# Patient Record
Sex: Male | Born: 1975 | Race: White | Hispanic: No | Marital: Single | Smoking: Former smoker
Health system: Southern US, Community
[De-identification: ages and names within clinical notes are randomized; demographics above are authoritative.]

## PROBLEM LIST (undated history)

## (undated) DIAGNOSIS — M869 Osteomyelitis, unspecified: Secondary | ICD-10-CM

## (undated) DIAGNOSIS — F419 Anxiety disorder, unspecified: Secondary | ICD-10-CM

## (undated) DIAGNOSIS — N179 Acute kidney failure, unspecified: Secondary | ICD-10-CM

## (undated) DIAGNOSIS — F329 Major depressive disorder, single episode, unspecified: Secondary | ICD-10-CM

## (undated) DIAGNOSIS — N319 Neuromuscular dysfunction of bladder, unspecified: Secondary | ICD-10-CM

## (undated) DIAGNOSIS — G8220 Paraplegia, unspecified: Secondary | ICD-10-CM

## (undated) DIAGNOSIS — F32A Depression, unspecified: Secondary | ICD-10-CM

## (undated) DIAGNOSIS — L8990 Pressure ulcer of unspecified site, unspecified stage: Secondary | ICD-10-CM

## (undated) HISTORY — PX: IRRIGATION AND DEBRIDEMENT BUTTOCKS: SHX6601

## (undated) HISTORY — PX: TOE AMPUTATION: SHX809

---

## 2000-02-21 DIAGNOSIS — G8220 Paraplegia, unspecified: Secondary | ICD-10-CM

## 2000-02-21 HISTORY — DX: Paraplegia, unspecified: G82.20

## 2007-06-11 ENCOUNTER — Inpatient Hospital Stay: Payer: Self-pay | Admitting: Internal Medicine

## 2007-06-11 ENCOUNTER — Other Ambulatory Visit: Payer: Self-pay

## 2007-08-29 ENCOUNTER — Inpatient Hospital Stay: Payer: Self-pay | Admitting: Internal Medicine

## 2007-08-29 ENCOUNTER — Other Ambulatory Visit: Payer: Self-pay

## 2010-07-03 ENCOUNTER — Emergency Department: Payer: Self-pay | Admitting: Unknown Physician Specialty

## 2010-07-10 ENCOUNTER — Inpatient Hospital Stay: Payer: Self-pay | Admitting: Internal Medicine

## 2010-07-10 ENCOUNTER — Encounter: Payer: Self-pay | Admitting: Cardiothoracic Surgery

## 2010-07-14 ENCOUNTER — Encounter: Payer: Self-pay | Admitting: Cardiothoracic Surgery

## 2010-07-16 LAB — PATHOLOGY REPORT

## 2010-07-29 ENCOUNTER — Encounter: Payer: Self-pay | Admitting: Cardiothoracic Surgery

## 2010-08-28 ENCOUNTER — Encounter: Payer: Self-pay | Admitting: Cardiothoracic Surgery

## 2010-09-28 ENCOUNTER — Encounter: Payer: Self-pay | Admitting: Cardiothoracic Surgery

## 2010-10-28 ENCOUNTER — Encounter: Payer: Self-pay | Admitting: Cardiothoracic Surgery

## 2011-01-18 ENCOUNTER — Ambulatory Visit: Payer: Self-pay | Admitting: Internal Medicine

## 2011-01-20 ENCOUNTER — Ambulatory Visit: Payer: Self-pay

## 2011-03-15 ENCOUNTER — Encounter: Payer: Self-pay | Admitting: Nurse Practitioner

## 2011-03-15 ENCOUNTER — Encounter: Payer: Self-pay | Admitting: Cardiothoracic Surgery

## 2011-03-20 DIAGNOSIS — L8994 Pressure ulcer of unspecified site, stage 4: Secondary | ICD-10-CM | POA: Diagnosis not present

## 2011-03-20 DIAGNOSIS — L89309 Pressure ulcer of unspecified buttock, unspecified stage: Secondary | ICD-10-CM | POA: Diagnosis not present

## 2011-03-20 DIAGNOSIS — G8220 Paraplegia, unspecified: Secondary | ICD-10-CM | POA: Diagnosis not present

## 2011-03-20 DIAGNOSIS — Z4800 Encounter for change or removal of nonsurgical wound dressing: Secondary | ICD-10-CM | POA: Diagnosis not present

## 2011-03-20 DIAGNOSIS — N319 Neuromuscular dysfunction of bladder, unspecified: Secondary | ICD-10-CM | POA: Diagnosis not present

## 2011-03-20 DIAGNOSIS — E46 Unspecified protein-calorie malnutrition: Secondary | ICD-10-CM | POA: Diagnosis not present

## 2011-03-26 DIAGNOSIS — L89309 Pressure ulcer of unspecified buttock, unspecified stage: Secondary | ICD-10-CM | POA: Diagnosis not present

## 2011-03-26 DIAGNOSIS — G8220 Paraplegia, unspecified: Secondary | ICD-10-CM | POA: Diagnosis not present

## 2011-03-26 DIAGNOSIS — E46 Unspecified protein-calorie malnutrition: Secondary | ICD-10-CM | POA: Diagnosis not present

## 2011-03-26 DIAGNOSIS — Z4800 Encounter for change or removal of nonsurgical wound dressing: Secondary | ICD-10-CM | POA: Diagnosis not present

## 2011-03-26 DIAGNOSIS — N319 Neuromuscular dysfunction of bladder, unspecified: Secondary | ICD-10-CM | POA: Diagnosis not present

## 2011-03-26 DIAGNOSIS — L8994 Pressure ulcer of unspecified site, stage 4: Secondary | ICD-10-CM | POA: Diagnosis not present

## 2011-03-27 DIAGNOSIS — G8220 Paraplegia, unspecified: Secondary | ICD-10-CM | POA: Diagnosis not present

## 2011-03-27 DIAGNOSIS — L8994 Pressure ulcer of unspecified site, stage 4: Secondary | ICD-10-CM | POA: Diagnosis not present

## 2011-03-27 DIAGNOSIS — L89309 Pressure ulcer of unspecified buttock, unspecified stage: Secondary | ICD-10-CM | POA: Diagnosis not present

## 2011-03-27 DIAGNOSIS — Z4800 Encounter for change or removal of nonsurgical wound dressing: Secondary | ICD-10-CM | POA: Diagnosis not present

## 2011-03-27 DIAGNOSIS — E46 Unspecified protein-calorie malnutrition: Secondary | ICD-10-CM | POA: Diagnosis not present

## 2011-03-27 DIAGNOSIS — N319 Neuromuscular dysfunction of bladder, unspecified: Secondary | ICD-10-CM | POA: Diagnosis not present

## 2011-03-29 DIAGNOSIS — Z4800 Encounter for change or removal of nonsurgical wound dressing: Secondary | ICD-10-CM | POA: Diagnosis not present

## 2011-03-29 DIAGNOSIS — L8994 Pressure ulcer of unspecified site, stage 4: Secondary | ICD-10-CM | POA: Diagnosis not present

## 2011-03-29 DIAGNOSIS — L89309 Pressure ulcer of unspecified buttock, unspecified stage: Secondary | ICD-10-CM | POA: Diagnosis not present

## 2011-03-29 DIAGNOSIS — E46 Unspecified protein-calorie malnutrition: Secondary | ICD-10-CM | POA: Diagnosis not present

## 2011-03-29 DIAGNOSIS — N319 Neuromuscular dysfunction of bladder, unspecified: Secondary | ICD-10-CM | POA: Diagnosis not present

## 2011-03-29 DIAGNOSIS — G8220 Paraplegia, unspecified: Secondary | ICD-10-CM | POA: Diagnosis not present

## 2011-03-30 ENCOUNTER — Encounter: Payer: Self-pay | Admitting: Cardiothoracic Surgery

## 2011-04-01 DIAGNOSIS — N319 Neuromuscular dysfunction of bladder, unspecified: Secondary | ICD-10-CM | POA: Diagnosis not present

## 2011-04-01 DIAGNOSIS — Z4800 Encounter for change or removal of nonsurgical wound dressing: Secondary | ICD-10-CM | POA: Diagnosis not present

## 2011-04-01 DIAGNOSIS — G8220 Paraplegia, unspecified: Secondary | ICD-10-CM | POA: Diagnosis not present

## 2011-04-01 DIAGNOSIS — L89309 Pressure ulcer of unspecified buttock, unspecified stage: Secondary | ICD-10-CM | POA: Diagnosis not present

## 2011-04-01 DIAGNOSIS — L8994 Pressure ulcer of unspecified site, stage 4: Secondary | ICD-10-CM | POA: Diagnosis not present

## 2011-04-01 DIAGNOSIS — E46 Unspecified protein-calorie malnutrition: Secondary | ICD-10-CM | POA: Diagnosis not present

## 2011-04-04 DIAGNOSIS — L89309 Pressure ulcer of unspecified buttock, unspecified stage: Secondary | ICD-10-CM | POA: Diagnosis not present

## 2011-04-04 DIAGNOSIS — G8220 Paraplegia, unspecified: Secondary | ICD-10-CM | POA: Diagnosis not present

## 2011-04-04 DIAGNOSIS — E46 Unspecified protein-calorie malnutrition: Secondary | ICD-10-CM | POA: Diagnosis not present

## 2011-04-04 DIAGNOSIS — Z4800 Encounter for change or removal of nonsurgical wound dressing: Secondary | ICD-10-CM | POA: Diagnosis not present

## 2011-04-04 DIAGNOSIS — L8994 Pressure ulcer of unspecified site, stage 4: Secondary | ICD-10-CM | POA: Diagnosis not present

## 2011-04-04 DIAGNOSIS — N319 Neuromuscular dysfunction of bladder, unspecified: Secondary | ICD-10-CM | POA: Diagnosis not present

## 2011-04-10 DIAGNOSIS — G8220 Paraplegia, unspecified: Secondary | ICD-10-CM | POA: Diagnosis not present

## 2011-04-10 DIAGNOSIS — E46 Unspecified protein-calorie malnutrition: Secondary | ICD-10-CM | POA: Diagnosis not present

## 2011-04-10 DIAGNOSIS — Z4800 Encounter for change or removal of nonsurgical wound dressing: Secondary | ICD-10-CM | POA: Diagnosis not present

## 2011-04-10 DIAGNOSIS — N319 Neuromuscular dysfunction of bladder, unspecified: Secondary | ICD-10-CM | POA: Diagnosis not present

## 2011-04-10 DIAGNOSIS — L89309 Pressure ulcer of unspecified buttock, unspecified stage: Secondary | ICD-10-CM | POA: Diagnosis not present

## 2011-04-10 DIAGNOSIS — L8994 Pressure ulcer of unspecified site, stage 4: Secondary | ICD-10-CM | POA: Diagnosis not present

## 2011-04-12 DIAGNOSIS — N319 Neuromuscular dysfunction of bladder, unspecified: Secondary | ICD-10-CM | POA: Diagnosis not present

## 2011-04-12 DIAGNOSIS — E46 Unspecified protein-calorie malnutrition: Secondary | ICD-10-CM | POA: Diagnosis not present

## 2011-04-12 DIAGNOSIS — L89309 Pressure ulcer of unspecified buttock, unspecified stage: Secondary | ICD-10-CM | POA: Diagnosis not present

## 2011-04-12 DIAGNOSIS — Z4800 Encounter for change or removal of nonsurgical wound dressing: Secondary | ICD-10-CM | POA: Diagnosis not present

## 2011-04-12 DIAGNOSIS — L8994 Pressure ulcer of unspecified site, stage 4: Secondary | ICD-10-CM | POA: Diagnosis not present

## 2011-04-12 DIAGNOSIS — G8220 Paraplegia, unspecified: Secondary | ICD-10-CM | POA: Diagnosis not present

## 2011-04-15 DIAGNOSIS — L8994 Pressure ulcer of unspecified site, stage 4: Secondary | ICD-10-CM | POA: Diagnosis not present

## 2011-04-15 DIAGNOSIS — E46 Unspecified protein-calorie malnutrition: Secondary | ICD-10-CM | POA: Diagnosis not present

## 2011-04-15 DIAGNOSIS — Z4800 Encounter for change or removal of nonsurgical wound dressing: Secondary | ICD-10-CM | POA: Diagnosis not present

## 2011-04-15 DIAGNOSIS — L89309 Pressure ulcer of unspecified buttock, unspecified stage: Secondary | ICD-10-CM | POA: Diagnosis not present

## 2011-04-15 DIAGNOSIS — N319 Neuromuscular dysfunction of bladder, unspecified: Secondary | ICD-10-CM | POA: Diagnosis not present

## 2011-04-15 DIAGNOSIS — G8220 Paraplegia, unspecified: Secondary | ICD-10-CM | POA: Diagnosis not present

## 2011-04-19 DIAGNOSIS — L8994 Pressure ulcer of unspecified site, stage 4: Secondary | ICD-10-CM | POA: Diagnosis not present

## 2011-04-19 DIAGNOSIS — Z4800 Encounter for change or removal of nonsurgical wound dressing: Secondary | ICD-10-CM | POA: Diagnosis not present

## 2011-04-19 DIAGNOSIS — N319 Neuromuscular dysfunction of bladder, unspecified: Secondary | ICD-10-CM | POA: Diagnosis not present

## 2011-04-19 DIAGNOSIS — L89309 Pressure ulcer of unspecified buttock, unspecified stage: Secondary | ICD-10-CM | POA: Diagnosis not present

## 2011-04-19 DIAGNOSIS — E46 Unspecified protein-calorie malnutrition: Secondary | ICD-10-CM | POA: Diagnosis not present

## 2011-04-19 DIAGNOSIS — G8220 Paraplegia, unspecified: Secondary | ICD-10-CM | POA: Diagnosis not present

## 2011-04-21 DIAGNOSIS — N319 Neuromuscular dysfunction of bladder, unspecified: Secondary | ICD-10-CM | POA: Diagnosis not present

## 2011-04-21 DIAGNOSIS — E46 Unspecified protein-calorie malnutrition: Secondary | ICD-10-CM | POA: Diagnosis not present

## 2011-04-21 DIAGNOSIS — L8994 Pressure ulcer of unspecified site, stage 4: Secondary | ICD-10-CM | POA: Diagnosis not present

## 2011-04-21 DIAGNOSIS — L89309 Pressure ulcer of unspecified buttock, unspecified stage: Secondary | ICD-10-CM | POA: Diagnosis not present

## 2011-04-21 DIAGNOSIS — Z4800 Encounter for change or removal of nonsurgical wound dressing: Secondary | ICD-10-CM | POA: Diagnosis not present

## 2011-04-21 DIAGNOSIS — G8220 Paraplegia, unspecified: Secondary | ICD-10-CM | POA: Diagnosis not present

## 2011-04-26 DIAGNOSIS — L89309 Pressure ulcer of unspecified buttock, unspecified stage: Secondary | ICD-10-CM | POA: Diagnosis not present

## 2011-04-26 DIAGNOSIS — N319 Neuromuscular dysfunction of bladder, unspecified: Secondary | ICD-10-CM | POA: Diagnosis not present

## 2011-04-26 DIAGNOSIS — E46 Unspecified protein-calorie malnutrition: Secondary | ICD-10-CM | POA: Diagnosis not present

## 2011-04-26 DIAGNOSIS — Z4800 Encounter for change or removal of nonsurgical wound dressing: Secondary | ICD-10-CM | POA: Diagnosis not present

## 2011-04-26 DIAGNOSIS — L8994 Pressure ulcer of unspecified site, stage 4: Secondary | ICD-10-CM | POA: Diagnosis not present

## 2011-04-26 DIAGNOSIS — G8220 Paraplegia, unspecified: Secondary | ICD-10-CM | POA: Diagnosis not present

## 2011-04-29 DIAGNOSIS — G8220 Paraplegia, unspecified: Secondary | ICD-10-CM | POA: Diagnosis not present

## 2011-04-29 DIAGNOSIS — L8994 Pressure ulcer of unspecified site, stage 4: Secondary | ICD-10-CM | POA: Diagnosis not present

## 2011-04-29 DIAGNOSIS — L89309 Pressure ulcer of unspecified buttock, unspecified stage: Secondary | ICD-10-CM | POA: Diagnosis not present

## 2011-04-29 DIAGNOSIS — N319 Neuromuscular dysfunction of bladder, unspecified: Secondary | ICD-10-CM | POA: Diagnosis not present

## 2011-04-29 DIAGNOSIS — Z4800 Encounter for change or removal of nonsurgical wound dressing: Secondary | ICD-10-CM | POA: Diagnosis not present

## 2011-04-29 DIAGNOSIS — E46 Unspecified protein-calorie malnutrition: Secondary | ICD-10-CM | POA: Diagnosis not present

## 2011-04-30 ENCOUNTER — Encounter: Payer: Self-pay | Admitting: Cardiothoracic Surgery

## 2011-04-30 DIAGNOSIS — L8994 Pressure ulcer of unspecified site, stage 4: Secondary | ICD-10-CM | POA: Diagnosis not present

## 2011-04-30 DIAGNOSIS — L89209 Pressure ulcer of unspecified hip, unspecified stage: Secondary | ICD-10-CM | POA: Diagnosis not present

## 2011-05-02 DIAGNOSIS — N319 Neuromuscular dysfunction of bladder, unspecified: Secondary | ICD-10-CM | POA: Diagnosis not present

## 2011-05-02 DIAGNOSIS — Z4800 Encounter for change or removal of nonsurgical wound dressing: Secondary | ICD-10-CM | POA: Diagnosis not present

## 2011-05-02 DIAGNOSIS — L8994 Pressure ulcer of unspecified site, stage 4: Secondary | ICD-10-CM | POA: Diagnosis not present

## 2011-05-02 DIAGNOSIS — E46 Unspecified protein-calorie malnutrition: Secondary | ICD-10-CM | POA: Diagnosis not present

## 2011-05-02 DIAGNOSIS — L89309 Pressure ulcer of unspecified buttock, unspecified stage: Secondary | ICD-10-CM | POA: Diagnosis not present

## 2011-05-02 DIAGNOSIS — G8220 Paraplegia, unspecified: Secondary | ICD-10-CM | POA: Diagnosis not present

## 2011-05-08 DIAGNOSIS — Z4800 Encounter for change or removal of nonsurgical wound dressing: Secondary | ICD-10-CM | POA: Diagnosis not present

## 2011-05-08 DIAGNOSIS — E46 Unspecified protein-calorie malnutrition: Secondary | ICD-10-CM | POA: Diagnosis not present

## 2011-05-08 DIAGNOSIS — N319 Neuromuscular dysfunction of bladder, unspecified: Secondary | ICD-10-CM | POA: Diagnosis not present

## 2011-05-08 DIAGNOSIS — L89309 Pressure ulcer of unspecified buttock, unspecified stage: Secondary | ICD-10-CM | POA: Diagnosis not present

## 2011-05-08 DIAGNOSIS — G8220 Paraplegia, unspecified: Secondary | ICD-10-CM | POA: Diagnosis not present

## 2011-05-08 DIAGNOSIS — L8994 Pressure ulcer of unspecified site, stage 4: Secondary | ICD-10-CM | POA: Diagnosis not present

## 2011-05-10 DIAGNOSIS — L8994 Pressure ulcer of unspecified site, stage 4: Secondary | ICD-10-CM | POA: Diagnosis not present

## 2011-05-10 DIAGNOSIS — G8220 Paraplegia, unspecified: Secondary | ICD-10-CM | POA: Diagnosis not present

## 2011-05-10 DIAGNOSIS — N319 Neuromuscular dysfunction of bladder, unspecified: Secondary | ICD-10-CM | POA: Diagnosis not present

## 2011-05-10 DIAGNOSIS — Z4800 Encounter for change or removal of nonsurgical wound dressing: Secondary | ICD-10-CM | POA: Diagnosis not present

## 2011-05-10 DIAGNOSIS — L89309 Pressure ulcer of unspecified buttock, unspecified stage: Secondary | ICD-10-CM | POA: Diagnosis not present

## 2011-05-10 DIAGNOSIS — E46 Unspecified protein-calorie malnutrition: Secondary | ICD-10-CM | POA: Diagnosis not present

## 2011-05-17 DIAGNOSIS — L8994 Pressure ulcer of unspecified site, stage 4: Secondary | ICD-10-CM | POA: Diagnosis not present

## 2011-05-17 DIAGNOSIS — E46 Unspecified protein-calorie malnutrition: Secondary | ICD-10-CM | POA: Diagnosis not present

## 2011-05-17 DIAGNOSIS — G8220 Paraplegia, unspecified: Secondary | ICD-10-CM | POA: Diagnosis not present

## 2011-05-17 DIAGNOSIS — N319 Neuromuscular dysfunction of bladder, unspecified: Secondary | ICD-10-CM | POA: Diagnosis not present

## 2011-05-17 DIAGNOSIS — Z4800 Encounter for change or removal of nonsurgical wound dressing: Secondary | ICD-10-CM | POA: Diagnosis not present

## 2011-05-17 DIAGNOSIS — L89309 Pressure ulcer of unspecified buttock, unspecified stage: Secondary | ICD-10-CM | POA: Diagnosis not present

## 2011-05-19 DIAGNOSIS — F17200 Nicotine dependence, unspecified, uncomplicated: Secondary | ICD-10-CM | POA: Diagnosis not present

## 2011-05-19 DIAGNOSIS — Z4800 Encounter for change or removal of nonsurgical wound dressing: Secondary | ICD-10-CM | POA: Diagnosis not present

## 2011-05-19 DIAGNOSIS — E46 Unspecified protein-calorie malnutrition: Secondary | ICD-10-CM | POA: Diagnosis not present

## 2011-05-19 DIAGNOSIS — L89109 Pressure ulcer of unspecified part of back, unspecified stage: Secondary | ICD-10-CM | POA: Diagnosis not present

## 2011-05-19 DIAGNOSIS — L89309 Pressure ulcer of unspecified buttock, unspecified stage: Secondary | ICD-10-CM | POA: Diagnosis not present

## 2011-05-19 DIAGNOSIS — G8220 Paraplegia, unspecified: Secondary | ICD-10-CM | POA: Diagnosis not present

## 2011-05-19 DIAGNOSIS — L8992 Pressure ulcer of unspecified site, stage 2: Secondary | ICD-10-CM | POA: Diagnosis not present

## 2011-05-19 DIAGNOSIS — L8994 Pressure ulcer of unspecified site, stage 4: Secondary | ICD-10-CM | POA: Diagnosis not present

## 2011-05-19 DIAGNOSIS — N319 Neuromuscular dysfunction of bladder, unspecified: Secondary | ICD-10-CM | POA: Diagnosis not present

## 2011-05-22 DIAGNOSIS — E46 Unspecified protein-calorie malnutrition: Secondary | ICD-10-CM | POA: Diagnosis not present

## 2011-05-22 DIAGNOSIS — L8994 Pressure ulcer of unspecified site, stage 4: Secondary | ICD-10-CM | POA: Diagnosis not present

## 2011-05-22 DIAGNOSIS — L89309 Pressure ulcer of unspecified buttock, unspecified stage: Secondary | ICD-10-CM | POA: Diagnosis not present

## 2011-05-22 DIAGNOSIS — L8992 Pressure ulcer of unspecified site, stage 2: Secondary | ICD-10-CM | POA: Diagnosis not present

## 2011-05-22 DIAGNOSIS — L89109 Pressure ulcer of unspecified part of back, unspecified stage: Secondary | ICD-10-CM | POA: Diagnosis not present

## 2011-05-22 DIAGNOSIS — G8220 Paraplegia, unspecified: Secondary | ICD-10-CM | POA: Diagnosis not present

## 2011-05-23 DIAGNOSIS — L8994 Pressure ulcer of unspecified site, stage 4: Secondary | ICD-10-CM | POA: Diagnosis not present

## 2011-05-23 DIAGNOSIS — L89309 Pressure ulcer of unspecified buttock, unspecified stage: Secondary | ICD-10-CM | POA: Diagnosis not present

## 2011-05-23 DIAGNOSIS — E46 Unspecified protein-calorie malnutrition: Secondary | ICD-10-CM | POA: Diagnosis not present

## 2011-05-23 DIAGNOSIS — L89109 Pressure ulcer of unspecified part of back, unspecified stage: Secondary | ICD-10-CM | POA: Diagnosis not present

## 2011-05-23 DIAGNOSIS — G8220 Paraplegia, unspecified: Secondary | ICD-10-CM | POA: Diagnosis not present

## 2011-05-23 DIAGNOSIS — L8992 Pressure ulcer of unspecified site, stage 2: Secondary | ICD-10-CM | POA: Diagnosis not present

## 2011-05-28 DIAGNOSIS — L8994 Pressure ulcer of unspecified site, stage 4: Secondary | ICD-10-CM | POA: Diagnosis not present

## 2011-05-28 DIAGNOSIS — L89209 Pressure ulcer of unspecified hip, unspecified stage: Secondary | ICD-10-CM | POA: Diagnosis not present

## 2011-05-29 DIAGNOSIS — L89309 Pressure ulcer of unspecified buttock, unspecified stage: Secondary | ICD-10-CM | POA: Diagnosis not present

## 2011-05-29 DIAGNOSIS — E46 Unspecified protein-calorie malnutrition: Secondary | ICD-10-CM | POA: Diagnosis not present

## 2011-05-29 DIAGNOSIS — G8220 Paraplegia, unspecified: Secondary | ICD-10-CM | POA: Diagnosis not present

## 2011-05-29 DIAGNOSIS — L8994 Pressure ulcer of unspecified site, stage 4: Secondary | ICD-10-CM | POA: Diagnosis not present

## 2011-05-29 DIAGNOSIS — L8992 Pressure ulcer of unspecified site, stage 2: Secondary | ICD-10-CM | POA: Diagnosis not present

## 2011-05-29 DIAGNOSIS — L89109 Pressure ulcer of unspecified part of back, unspecified stage: Secondary | ICD-10-CM | POA: Diagnosis not present

## 2011-05-31 ENCOUNTER — Encounter: Payer: Self-pay | Admitting: Cardiothoracic Surgery

## 2011-06-07 DIAGNOSIS — L8992 Pressure ulcer of unspecified site, stage 2: Secondary | ICD-10-CM | POA: Diagnosis not present

## 2011-06-07 DIAGNOSIS — L8994 Pressure ulcer of unspecified site, stage 4: Secondary | ICD-10-CM | POA: Diagnosis not present

## 2011-06-07 DIAGNOSIS — L89109 Pressure ulcer of unspecified part of back, unspecified stage: Secondary | ICD-10-CM | POA: Diagnosis not present

## 2011-06-07 DIAGNOSIS — E46 Unspecified protein-calorie malnutrition: Secondary | ICD-10-CM | POA: Diagnosis not present

## 2011-06-07 DIAGNOSIS — L89309 Pressure ulcer of unspecified buttock, unspecified stage: Secondary | ICD-10-CM | POA: Diagnosis not present

## 2011-06-07 DIAGNOSIS — G8220 Paraplegia, unspecified: Secondary | ICD-10-CM | POA: Diagnosis not present

## 2011-06-12 DIAGNOSIS — E46 Unspecified protein-calorie malnutrition: Secondary | ICD-10-CM | POA: Diagnosis not present

## 2011-06-12 DIAGNOSIS — L8994 Pressure ulcer of unspecified site, stage 4: Secondary | ICD-10-CM | POA: Diagnosis not present

## 2011-06-12 DIAGNOSIS — G8220 Paraplegia, unspecified: Secondary | ICD-10-CM | POA: Diagnosis not present

## 2011-06-12 DIAGNOSIS — L8992 Pressure ulcer of unspecified site, stage 2: Secondary | ICD-10-CM | POA: Diagnosis not present

## 2011-06-12 DIAGNOSIS — L89309 Pressure ulcer of unspecified buttock, unspecified stage: Secondary | ICD-10-CM | POA: Diagnosis not present

## 2011-06-12 DIAGNOSIS — L89109 Pressure ulcer of unspecified part of back, unspecified stage: Secondary | ICD-10-CM | POA: Diagnosis not present

## 2011-06-20 DIAGNOSIS — L89109 Pressure ulcer of unspecified part of back, unspecified stage: Secondary | ICD-10-CM | POA: Diagnosis not present

## 2011-06-20 DIAGNOSIS — L8994 Pressure ulcer of unspecified site, stage 4: Secondary | ICD-10-CM | POA: Diagnosis not present

## 2011-06-20 DIAGNOSIS — E46 Unspecified protein-calorie malnutrition: Secondary | ICD-10-CM | POA: Diagnosis not present

## 2011-06-20 DIAGNOSIS — L8992 Pressure ulcer of unspecified site, stage 2: Secondary | ICD-10-CM | POA: Diagnosis not present

## 2011-06-20 DIAGNOSIS — L89309 Pressure ulcer of unspecified buttock, unspecified stage: Secondary | ICD-10-CM | POA: Diagnosis not present

## 2011-06-20 DIAGNOSIS — G8220 Paraplegia, unspecified: Secondary | ICD-10-CM | POA: Diagnosis not present

## 2011-06-21 DIAGNOSIS — G8220 Paraplegia, unspecified: Secondary | ICD-10-CM | POA: Diagnosis not present

## 2011-06-21 DIAGNOSIS — L89309 Pressure ulcer of unspecified buttock, unspecified stage: Secondary | ICD-10-CM | POA: Diagnosis not present

## 2011-06-21 DIAGNOSIS — L8994 Pressure ulcer of unspecified site, stage 4: Secondary | ICD-10-CM | POA: Diagnosis not present

## 2011-06-21 DIAGNOSIS — E46 Unspecified protein-calorie malnutrition: Secondary | ICD-10-CM | POA: Diagnosis not present

## 2011-06-21 DIAGNOSIS — L8992 Pressure ulcer of unspecified site, stage 2: Secondary | ICD-10-CM | POA: Diagnosis not present

## 2011-06-21 DIAGNOSIS — L89109 Pressure ulcer of unspecified part of back, unspecified stage: Secondary | ICD-10-CM | POA: Diagnosis not present

## 2011-06-27 DIAGNOSIS — L8992 Pressure ulcer of unspecified site, stage 2: Secondary | ICD-10-CM | POA: Diagnosis not present

## 2011-06-27 DIAGNOSIS — L8994 Pressure ulcer of unspecified site, stage 4: Secondary | ICD-10-CM | POA: Diagnosis not present

## 2011-06-27 DIAGNOSIS — G8220 Paraplegia, unspecified: Secondary | ICD-10-CM | POA: Diagnosis not present

## 2011-06-27 DIAGNOSIS — L89109 Pressure ulcer of unspecified part of back, unspecified stage: Secondary | ICD-10-CM | POA: Diagnosis not present

## 2011-06-27 DIAGNOSIS — E46 Unspecified protein-calorie malnutrition: Secondary | ICD-10-CM | POA: Diagnosis not present

## 2011-06-27 DIAGNOSIS — L89309 Pressure ulcer of unspecified buttock, unspecified stage: Secondary | ICD-10-CM | POA: Diagnosis not present

## 2011-06-28 ENCOUNTER — Encounter: Payer: Self-pay | Admitting: Cardiothoracic Surgery

## 2011-06-28 DIAGNOSIS — L89209 Pressure ulcer of unspecified hip, unspecified stage: Secondary | ICD-10-CM | POA: Diagnosis not present

## 2011-06-28 DIAGNOSIS — L8994 Pressure ulcer of unspecified site, stage 4: Secondary | ICD-10-CM | POA: Diagnosis not present

## 2011-07-04 DIAGNOSIS — E46 Unspecified protein-calorie malnutrition: Secondary | ICD-10-CM | POA: Diagnosis not present

## 2011-07-04 DIAGNOSIS — L89109 Pressure ulcer of unspecified part of back, unspecified stage: Secondary | ICD-10-CM | POA: Diagnosis not present

## 2011-07-04 DIAGNOSIS — L8992 Pressure ulcer of unspecified site, stage 2: Secondary | ICD-10-CM | POA: Diagnosis not present

## 2011-07-04 DIAGNOSIS — G8220 Paraplegia, unspecified: Secondary | ICD-10-CM | POA: Diagnosis not present

## 2011-07-04 DIAGNOSIS — L89309 Pressure ulcer of unspecified buttock, unspecified stage: Secondary | ICD-10-CM | POA: Diagnosis not present

## 2011-07-04 DIAGNOSIS — L8994 Pressure ulcer of unspecified site, stage 4: Secondary | ICD-10-CM | POA: Diagnosis not present

## 2011-07-05 DIAGNOSIS — L8994 Pressure ulcer of unspecified site, stage 4: Secondary | ICD-10-CM | POA: Diagnosis not present

## 2011-07-05 DIAGNOSIS — L89209 Pressure ulcer of unspecified hip, unspecified stage: Secondary | ICD-10-CM | POA: Diagnosis not present

## 2011-07-10 DIAGNOSIS — L8992 Pressure ulcer of unspecified site, stage 2: Secondary | ICD-10-CM | POA: Diagnosis not present

## 2011-07-10 DIAGNOSIS — L8994 Pressure ulcer of unspecified site, stage 4: Secondary | ICD-10-CM | POA: Diagnosis not present

## 2011-07-10 DIAGNOSIS — G8220 Paraplegia, unspecified: Secondary | ICD-10-CM | POA: Diagnosis not present

## 2011-07-10 DIAGNOSIS — L89109 Pressure ulcer of unspecified part of back, unspecified stage: Secondary | ICD-10-CM | POA: Diagnosis not present

## 2011-07-10 DIAGNOSIS — L89309 Pressure ulcer of unspecified buttock, unspecified stage: Secondary | ICD-10-CM | POA: Diagnosis not present

## 2011-07-10 DIAGNOSIS — E46 Unspecified protein-calorie malnutrition: Secondary | ICD-10-CM | POA: Diagnosis not present

## 2011-07-17 DIAGNOSIS — L89309 Pressure ulcer of unspecified buttock, unspecified stage: Secondary | ICD-10-CM | POA: Diagnosis not present

## 2011-07-17 DIAGNOSIS — L89109 Pressure ulcer of unspecified part of back, unspecified stage: Secondary | ICD-10-CM | POA: Diagnosis not present

## 2011-07-17 DIAGNOSIS — G8220 Paraplegia, unspecified: Secondary | ICD-10-CM | POA: Diagnosis not present

## 2011-07-17 DIAGNOSIS — L8994 Pressure ulcer of unspecified site, stage 4: Secondary | ICD-10-CM | POA: Diagnosis not present

## 2011-07-17 DIAGNOSIS — L8992 Pressure ulcer of unspecified site, stage 2: Secondary | ICD-10-CM | POA: Diagnosis not present

## 2011-07-17 DIAGNOSIS — E46 Unspecified protein-calorie malnutrition: Secondary | ICD-10-CM | POA: Diagnosis not present

## 2011-08-11 DIAGNOSIS — IMO0002 Reserved for concepts with insufficient information to code with codable children: Secondary | ICD-10-CM | POA: Diagnosis not present

## 2011-08-11 DIAGNOSIS — L89209 Pressure ulcer of unspecified hip, unspecified stage: Secondary | ICD-10-CM | POA: Diagnosis not present

## 2011-08-11 DIAGNOSIS — M948X9 Other specified disorders of cartilage, unspecified sites: Secondary | ICD-10-CM | POA: Diagnosis not present

## 2011-08-11 DIAGNOSIS — G8220 Paraplegia, unspecified: Secondary | ICD-10-CM | POA: Diagnosis not present

## 2011-08-11 DIAGNOSIS — R634 Abnormal weight loss: Secondary | ICD-10-CM | POA: Diagnosis not present

## 2011-08-11 DIAGNOSIS — L8994 Pressure ulcer of unspecified site, stage 4: Secondary | ICD-10-CM | POA: Diagnosis not present

## 2011-08-11 DIAGNOSIS — L89309 Pressure ulcer of unspecified buttock, unspecified stage: Secondary | ICD-10-CM | POA: Diagnosis not present

## 2011-10-24 ENCOUNTER — Ambulatory Visit: Payer: Self-pay | Admitting: Family Medicine

## 2011-10-24 DIAGNOSIS — N390 Urinary tract infection, site not specified: Secondary | ICD-10-CM | POA: Diagnosis not present

## 2011-10-24 DIAGNOSIS — L98499 Non-pressure chronic ulcer of skin of other sites with unspecified severity: Secondary | ICD-10-CM | POA: Diagnosis not present

## 2011-10-24 DIAGNOSIS — G8220 Paraplegia, unspecified: Secondary | ICD-10-CM | POA: Diagnosis not present

## 2011-10-24 LAB — URINALYSIS, COMPLETE
Bilirubin,UR: NEGATIVE
Glucose,UR: NEGATIVE mg/dL (ref 0–75)
Ketone: NEGATIVE
Nitrite: NEGATIVE
Protein: 30
Specific Gravity: 1.02 (ref 1.003–1.030)

## 2012-06-10 ENCOUNTER — Emergency Department: Payer: Self-pay | Admitting: Internal Medicine

## 2012-06-10 DIAGNOSIS — L8990 Pressure ulcer of unspecified site, unspecified stage: Secondary | ICD-10-CM | POA: Diagnosis not present

## 2012-06-10 DIAGNOSIS — G8220 Paraplegia, unspecified: Secondary | ICD-10-CM | POA: Diagnosis not present

## 2012-06-10 DIAGNOSIS — N390 Urinary tract infection, site not specified: Secondary | ICD-10-CM | POA: Diagnosis not present

## 2012-06-10 DIAGNOSIS — F17200 Nicotine dependence, unspecified, uncomplicated: Secondary | ICD-10-CM | POA: Diagnosis not present

## 2012-06-10 DIAGNOSIS — F329 Major depressive disorder, single episode, unspecified: Secondary | ICD-10-CM | POA: Diagnosis not present

## 2012-06-10 DIAGNOSIS — L89209 Pressure ulcer of unspecified hip, unspecified stage: Secondary | ICD-10-CM | POA: Diagnosis not present

## 2012-06-10 DIAGNOSIS — Z79899 Other long term (current) drug therapy: Secondary | ICD-10-CM | POA: Diagnosis not present

## 2012-06-10 DIAGNOSIS — F312 Bipolar disorder, current episode manic severe with psychotic features: Secondary | ICD-10-CM | POA: Diagnosis not present

## 2012-06-10 DIAGNOSIS — L89109 Pressure ulcer of unspecified part of back, unspecified stage: Secondary | ICD-10-CM | POA: Diagnosis not present

## 2012-06-10 LAB — SALICYLATE LEVEL: Salicylates, Serum: 3 mg/dL — ABNORMAL HIGH

## 2012-06-10 LAB — URINALYSIS, COMPLETE
Bilirubin,UR: NEGATIVE
Glucose,UR: NEGATIVE mg/dL (ref 0–75)
Ketone: NEGATIVE
Nitrite: POSITIVE
Protein: NEGATIVE
RBC,UR: 3 /HPF (ref 0–5)
Squamous Epithelial: <1

## 2012-06-10 LAB — COMPREHENSIVE METABOLIC PANEL
Albumin: 2.8 g/dL — ABNORMAL LOW (ref 3.4–5.0)
Alkaline Phosphatase: 95 U/L (ref 50–136)
Anion Gap: 5 — ABNORMAL LOW (ref 7–16)
BUN: 12 mg/dL (ref 7–18)
Bilirubin,Total: 0.2 mg/dL (ref 0.2–1.0)
Calcium, Total: 8.4 mg/dL — ABNORMAL LOW (ref 8.5–10.1)
Chloride: 111 mmol/L — ABNORMAL HIGH (ref 98–107)
Co2: 25 mmol/L (ref 21–32)
Creatinine: 0.55 mg/dL — ABNORMAL LOW (ref 0.60–1.30)
EGFR (African American): >60
EGFR (Non-African Amer.): >60
Glucose: 95 mg/dL (ref 65–99)
Osmolality: 281 (ref 275–301)
Potassium: 3.8 mmol/L (ref 3.5–5.1)
SGPT (ALT): 15 U/L (ref 12–78)

## 2012-06-10 LAB — CBC
MCHC: 30.6 g/dL — ABNORMAL LOW (ref 32.0–36.0)
MCV: 80 fL (ref 80–100)
RBC: 4.3 10*6/uL — ABNORMAL LOW (ref 4.40–5.90)
WBC: 11.1 10*3/uL — ABNORMAL HIGH (ref 3.8–10.6)

## 2012-06-10 LAB — DRUG SCREEN, URINE
Benzodiazepine, Ur Scrn: NEGATIVE (ref ?–200)
Cocaine Metabolite,Ur ~~LOC~~: NEGATIVE (ref ?–300)
MDMA (Ecstasy)Ur Screen: NEGATIVE (ref ?–500)
Tricyclic, Ur Screen: NEGATIVE (ref ?–1000)

## 2012-06-10 LAB — ACETAMINOPHEN LEVEL: Acetaminophen: <2 ug/mL

## 2012-06-10 LAB — ETHANOL: Ethanol: <3 mg/dL

## 2012-08-13 DIAGNOSIS — R634 Abnormal weight loss: Secondary | ICD-10-CM | POA: Diagnosis not present

## 2012-08-13 DIAGNOSIS — N319 Neuromuscular dysfunction of bladder, unspecified: Secondary | ICD-10-CM | POA: Diagnosis not present

## 2012-08-13 DIAGNOSIS — L8990 Pressure ulcer of unspecified site, unspecified stage: Secondary | ICD-10-CM | POA: Diagnosis not present

## 2012-08-13 DIAGNOSIS — G8220 Paraplegia, unspecified: Secondary | ICD-10-CM | POA: Diagnosis not present

## 2013-06-07 DIAGNOSIS — R627 Adult failure to thrive: Secondary | ICD-10-CM | POA: Diagnosis not present

## 2013-06-07 DIAGNOSIS — L89109 Pressure ulcer of unspecified part of back, unspecified stage: Secondary | ICD-10-CM | POA: Diagnosis not present

## 2013-06-07 DIAGNOSIS — D649 Anemia, unspecified: Secondary | ICD-10-CM | POA: Diagnosis not present

## 2013-06-07 DIAGNOSIS — B961 Klebsiella pneumoniae [K. pneumoniae] as the cause of diseases classified elsewhere: Secondary | ICD-10-CM | POA: Diagnosis present

## 2013-06-07 DIAGNOSIS — L8994 Pressure ulcer of unspecified site, stage 4: Secondary | ICD-10-CM | POA: Diagnosis not present

## 2013-06-07 DIAGNOSIS — B951 Streptococcus, group B, as the cause of diseases classified elsewhere: Secondary | ICD-10-CM | POA: Diagnosis present

## 2013-06-07 DIAGNOSIS — N319 Neuromuscular dysfunction of bladder, unspecified: Secondary | ICD-10-CM | POA: Diagnosis not present

## 2013-06-07 DIAGNOSIS — I96 Gangrene, not elsewhere classified: Secondary | ICD-10-CM | POA: Diagnosis not present

## 2013-06-07 DIAGNOSIS — D638 Anemia in other chronic diseases classified elsewhere: Secondary | ICD-10-CM | POA: Diagnosis present

## 2013-06-07 DIAGNOSIS — IMO0002 Reserved for concepts with insufficient information to code with codable children: Secondary | ICD-10-CM | POA: Diagnosis not present

## 2013-06-07 DIAGNOSIS — Z981 Arthrodesis status: Secondary | ICD-10-CM | POA: Diagnosis not present

## 2013-06-07 DIAGNOSIS — Z681 Body mass index (BMI) 19 or less, adult: Secondary | ICD-10-CM | POA: Diagnosis not present

## 2013-06-07 DIAGNOSIS — Z602 Problems related to living alone: Secondary | ICD-10-CM | POA: Diagnosis not present

## 2013-06-07 DIAGNOSIS — Z79899 Other long term (current) drug therapy: Secondary | ICD-10-CM | POA: Diagnosis not present

## 2013-06-07 DIAGNOSIS — L8990 Pressure ulcer of unspecified site, unspecified stage: Secondary | ICD-10-CM | POA: Diagnosis not present

## 2013-06-07 DIAGNOSIS — E43 Unspecified severe protein-calorie malnutrition: Secondary | ICD-10-CM | POA: Diagnosis not present

## 2013-06-07 DIAGNOSIS — Z87440 Personal history of urinary (tract) infections: Secondary | ICD-10-CM | POA: Diagnosis not present

## 2013-06-07 DIAGNOSIS — K5900 Constipation, unspecified: Secondary | ICD-10-CM | POA: Diagnosis present

## 2013-06-07 DIAGNOSIS — G8220 Paraplegia, unspecified: Secondary | ICD-10-CM | POA: Diagnosis present

## 2013-06-07 DIAGNOSIS — F17200 Nicotine dependence, unspecified, uncomplicated: Secondary | ICD-10-CM | POA: Diagnosis present

## 2013-06-07 DIAGNOSIS — M86179 Other acute osteomyelitis, unspecified ankle and foot: Secondary | ICD-10-CM | POA: Diagnosis not present

## 2013-06-07 DIAGNOSIS — D509 Iron deficiency anemia, unspecified: Secondary | ICD-10-CM | POA: Diagnosis present

## 2013-06-07 DIAGNOSIS — Z792 Long term (current) use of antibiotics: Secondary | ICD-10-CM | POA: Diagnosis not present

## 2013-06-07 DIAGNOSIS — I959 Hypotension, unspecified: Secondary | ICD-10-CM | POA: Diagnosis not present

## 2013-06-07 DIAGNOSIS — M869 Osteomyelitis, unspecified: Secondary | ICD-10-CM | POA: Diagnosis not present

## 2013-06-07 DIAGNOSIS — L89309 Pressure ulcer of unspecified buttock, unspecified stage: Secondary | ICD-10-CM | POA: Diagnosis not present

## 2013-06-07 DIAGNOSIS — N390 Urinary tract infection, site not specified: Secondary | ICD-10-CM | POA: Diagnosis not present

## 2013-06-07 DIAGNOSIS — L299 Pruritus, unspecified: Secondary | ICD-10-CM | POA: Diagnosis present

## 2013-06-21 DIAGNOSIS — R627 Adult failure to thrive: Secondary | ICD-10-CM | POA: Diagnosis not present

## 2013-06-21 DIAGNOSIS — M869 Osteomyelitis, unspecified: Secondary | ICD-10-CM | POA: Diagnosis not present

## 2013-06-21 DIAGNOSIS — M86179 Other acute osteomyelitis, unspecified ankle and foot: Secondary | ICD-10-CM | POA: Diagnosis not present

## 2013-06-21 DIAGNOSIS — I96 Gangrene, not elsewhere classified: Secondary | ICD-10-CM | POA: Diagnosis not present

## 2013-06-21 DIAGNOSIS — M79609 Pain in unspecified limb: Secondary | ICD-10-CM | POA: Diagnosis not present

## 2013-06-21 DIAGNOSIS — Z681 Body mass index (BMI) 19 or less, adult: Secondary | ICD-10-CM | POA: Diagnosis not present

## 2013-06-21 DIAGNOSIS — R279 Unspecified lack of coordination: Secondary | ICD-10-CM | POA: Diagnosis not present

## 2013-06-21 DIAGNOSIS — R5381 Other malaise: Secondary | ICD-10-CM | POA: Diagnosis not present

## 2013-06-21 DIAGNOSIS — L89309 Pressure ulcer of unspecified buttock, unspecified stage: Secondary | ICD-10-CM | POA: Diagnosis present

## 2013-06-21 DIAGNOSIS — L89609 Pressure ulcer of unspecified heel, unspecified stage: Secondary | ICD-10-CM | POA: Diagnosis present

## 2013-06-21 DIAGNOSIS — E43 Unspecified severe protein-calorie malnutrition: Secondary | ICD-10-CM | POA: Diagnosis not present

## 2013-06-21 DIAGNOSIS — D638 Anemia in other chronic diseases classified elsewhere: Secondary | ICD-10-CM | POA: Diagnosis not present

## 2013-06-21 DIAGNOSIS — I809 Phlebitis and thrombophlebitis of unspecified site: Secondary | ICD-10-CM | POA: Diagnosis not present

## 2013-06-21 DIAGNOSIS — L89899 Pressure ulcer of other site, unspecified stage: Secondary | ICD-10-CM | POA: Diagnosis present

## 2013-06-21 DIAGNOSIS — L8990 Pressure ulcer of unspecified site, unspecified stage: Secondary | ICD-10-CM | POA: Diagnosis not present

## 2013-06-21 DIAGNOSIS — L8994 Pressure ulcer of unspecified site, stage 4: Secondary | ICD-10-CM | POA: Diagnosis not present

## 2013-06-21 DIAGNOSIS — B952 Enterococcus as the cause of diseases classified elsewhere: Secondary | ICD-10-CM | POA: Diagnosis not present

## 2013-06-21 DIAGNOSIS — M6281 Muscle weakness (generalized): Secondary | ICD-10-CM | POA: Diagnosis not present

## 2013-06-21 DIAGNOSIS — L97509 Non-pressure chronic ulcer of other part of unspecified foot with unspecified severity: Secondary | ICD-10-CM | POA: Diagnosis not present

## 2013-06-21 DIAGNOSIS — N319 Neuromuscular dysfunction of bladder, unspecified: Secondary | ICD-10-CM | POA: Diagnosis present

## 2013-06-21 DIAGNOSIS — L89109 Pressure ulcer of unspecified part of back, unspecified stage: Secondary | ICD-10-CM | POA: Diagnosis not present

## 2013-06-21 DIAGNOSIS — I959 Hypotension, unspecified: Secondary | ICD-10-CM | POA: Diagnosis not present

## 2013-06-21 DIAGNOSIS — L89209 Pressure ulcer of unspecified hip, unspecified stage: Secondary | ICD-10-CM | POA: Diagnosis present

## 2013-06-21 DIAGNOSIS — G8220 Paraplegia, unspecified: Secondary | ICD-10-CM | POA: Diagnosis not present

## 2013-06-21 DIAGNOSIS — E46 Unspecified protein-calorie malnutrition: Secondary | ICD-10-CM | POA: Diagnosis not present

## 2013-06-21 DIAGNOSIS — L600 Ingrowing nail: Secondary | ICD-10-CM | POA: Diagnosis not present

## 2013-06-21 DIAGNOSIS — N390 Urinary tract infection, site not specified: Secondary | ICD-10-CM | POA: Diagnosis present

## 2013-06-21 DIAGNOSIS — L8991 Pressure ulcer of unspecified site, stage 1: Secondary | ICD-10-CM | POA: Diagnosis present

## 2013-08-02 DIAGNOSIS — L89109 Pressure ulcer of unspecified part of back, unspecified stage: Secondary | ICD-10-CM | POA: Diagnosis not present

## 2013-08-02 DIAGNOSIS — N319 Neuromuscular dysfunction of bladder, unspecified: Secondary | ICD-10-CM | POA: Diagnosis not present

## 2013-08-02 DIAGNOSIS — G8220 Paraplegia, unspecified: Secondary | ICD-10-CM | POA: Diagnosis not present

## 2013-08-02 DIAGNOSIS — L8994 Pressure ulcer of unspecified site, stage 4: Secondary | ICD-10-CM | POA: Diagnosis not present

## 2013-08-02 DIAGNOSIS — IMO0002 Reserved for concepts with insufficient information to code with codable children: Secondary | ICD-10-CM | POA: Diagnosis not present

## 2013-08-02 DIAGNOSIS — L89309 Pressure ulcer of unspecified buttock, unspecified stage: Secondary | ICD-10-CM | POA: Diagnosis not present

## 2013-08-05 DIAGNOSIS — G8220 Paraplegia, unspecified: Secondary | ICD-10-CM | POA: Diagnosis not present

## 2013-08-05 DIAGNOSIS — L89109 Pressure ulcer of unspecified part of back, unspecified stage: Secondary | ICD-10-CM | POA: Diagnosis not present

## 2013-08-05 DIAGNOSIS — IMO0002 Reserved for concepts with insufficient information to code with codable children: Secondary | ICD-10-CM | POA: Diagnosis not present

## 2013-08-05 DIAGNOSIS — L89309 Pressure ulcer of unspecified buttock, unspecified stage: Secondary | ICD-10-CM | POA: Diagnosis not present

## 2013-08-05 DIAGNOSIS — L8994 Pressure ulcer of unspecified site, stage 4: Secondary | ICD-10-CM | POA: Diagnosis not present

## 2013-08-05 DIAGNOSIS — N319 Neuromuscular dysfunction of bladder, unspecified: Secondary | ICD-10-CM | POA: Diagnosis not present

## 2013-08-09 DIAGNOSIS — IMO0002 Reserved for concepts with insufficient information to code with codable children: Secondary | ICD-10-CM | POA: Diagnosis not present

## 2013-08-09 DIAGNOSIS — L8994 Pressure ulcer of unspecified site, stage 4: Secondary | ICD-10-CM | POA: Diagnosis not present

## 2013-08-09 DIAGNOSIS — L89109 Pressure ulcer of unspecified part of back, unspecified stage: Secondary | ICD-10-CM | POA: Diagnosis not present

## 2013-08-09 DIAGNOSIS — L89309 Pressure ulcer of unspecified buttock, unspecified stage: Secondary | ICD-10-CM | POA: Diagnosis not present

## 2013-08-09 DIAGNOSIS — G8220 Paraplegia, unspecified: Secondary | ICD-10-CM | POA: Diagnosis not present

## 2013-08-09 DIAGNOSIS — N319 Neuromuscular dysfunction of bladder, unspecified: Secondary | ICD-10-CM | POA: Diagnosis not present

## 2013-08-12 DIAGNOSIS — L89109 Pressure ulcer of unspecified part of back, unspecified stage: Secondary | ICD-10-CM | POA: Diagnosis not present

## 2013-08-12 DIAGNOSIS — IMO0002 Reserved for concepts with insufficient information to code with codable children: Secondary | ICD-10-CM | POA: Diagnosis not present

## 2013-08-12 DIAGNOSIS — L89309 Pressure ulcer of unspecified buttock, unspecified stage: Secondary | ICD-10-CM | POA: Diagnosis not present

## 2013-08-12 DIAGNOSIS — N319 Neuromuscular dysfunction of bladder, unspecified: Secondary | ICD-10-CM | POA: Diagnosis not present

## 2013-08-12 DIAGNOSIS — G8220 Paraplegia, unspecified: Secondary | ICD-10-CM | POA: Diagnosis not present

## 2013-08-12 DIAGNOSIS — L8994 Pressure ulcer of unspecified site, stage 4: Secondary | ICD-10-CM | POA: Diagnosis not present

## 2013-08-16 DIAGNOSIS — L89309 Pressure ulcer of unspecified buttock, unspecified stage: Secondary | ICD-10-CM | POA: Diagnosis not present

## 2013-08-16 DIAGNOSIS — G8220 Paraplegia, unspecified: Secondary | ICD-10-CM | POA: Diagnosis not present

## 2013-08-16 DIAGNOSIS — IMO0002 Reserved for concepts with insufficient information to code with codable children: Secondary | ICD-10-CM | POA: Diagnosis not present

## 2013-08-16 DIAGNOSIS — N319 Neuromuscular dysfunction of bladder, unspecified: Secondary | ICD-10-CM | POA: Diagnosis not present

## 2013-08-16 DIAGNOSIS — L89109 Pressure ulcer of unspecified part of back, unspecified stage: Secondary | ICD-10-CM | POA: Diagnosis not present

## 2013-08-16 DIAGNOSIS — L8994 Pressure ulcer of unspecified site, stage 4: Secondary | ICD-10-CM | POA: Diagnosis not present

## 2013-08-19 ENCOUNTER — Encounter: Payer: Self-pay | Admitting: Surgery

## 2013-08-19 DIAGNOSIS — L89109 Pressure ulcer of unspecified part of back, unspecified stage: Secondary | ICD-10-CM | POA: Diagnosis not present

## 2013-08-19 DIAGNOSIS — L89309 Pressure ulcer of unspecified buttock, unspecified stage: Secondary | ICD-10-CM | POA: Diagnosis not present

## 2013-08-19 DIAGNOSIS — G8220 Paraplegia, unspecified: Secondary | ICD-10-CM | POA: Diagnosis not present

## 2013-08-23 DIAGNOSIS — L89109 Pressure ulcer of unspecified part of back, unspecified stage: Secondary | ICD-10-CM | POA: Diagnosis not present

## 2013-08-23 DIAGNOSIS — IMO0002 Reserved for concepts with insufficient information to code with codable children: Secondary | ICD-10-CM | POA: Diagnosis not present

## 2013-08-23 DIAGNOSIS — L8994 Pressure ulcer of unspecified site, stage 4: Secondary | ICD-10-CM | POA: Diagnosis not present

## 2013-08-23 DIAGNOSIS — N319 Neuromuscular dysfunction of bladder, unspecified: Secondary | ICD-10-CM | POA: Diagnosis not present

## 2013-08-23 DIAGNOSIS — L89309 Pressure ulcer of unspecified buttock, unspecified stage: Secondary | ICD-10-CM | POA: Diagnosis not present

## 2013-08-23 DIAGNOSIS — G8220 Paraplegia, unspecified: Secondary | ICD-10-CM | POA: Diagnosis not present

## 2013-08-27 ENCOUNTER — Encounter: Payer: Self-pay | Admitting: Surgery

## 2013-08-27 DIAGNOSIS — L89109 Pressure ulcer of unspecified part of back, unspecified stage: Secondary | ICD-10-CM | POA: Diagnosis not present

## 2013-08-27 DIAGNOSIS — G8220 Paraplegia, unspecified: Secondary | ICD-10-CM | POA: Diagnosis not present

## 2013-08-27 DIAGNOSIS — L89309 Pressure ulcer of unspecified buttock, unspecified stage: Secondary | ICD-10-CM | POA: Diagnosis not present

## 2013-08-30 DIAGNOSIS — L89109 Pressure ulcer of unspecified part of back, unspecified stage: Secondary | ICD-10-CM | POA: Diagnosis not present

## 2013-08-30 DIAGNOSIS — N319 Neuromuscular dysfunction of bladder, unspecified: Secondary | ICD-10-CM | POA: Diagnosis not present

## 2013-08-30 DIAGNOSIS — G8220 Paraplegia, unspecified: Secondary | ICD-10-CM | POA: Diagnosis not present

## 2013-08-30 DIAGNOSIS — L89309 Pressure ulcer of unspecified buttock, unspecified stage: Secondary | ICD-10-CM | POA: Diagnosis not present

## 2013-08-30 DIAGNOSIS — IMO0002 Reserved for concepts with insufficient information to code with codable children: Secondary | ICD-10-CM | POA: Diagnosis not present

## 2013-08-30 DIAGNOSIS — L8994 Pressure ulcer of unspecified site, stage 4: Secondary | ICD-10-CM | POA: Diagnosis not present

## 2013-09-01 DIAGNOSIS — L8990 Pressure ulcer of unspecified site, unspecified stage: Secondary | ICD-10-CM | POA: Diagnosis not present

## 2013-09-01 DIAGNOSIS — L8994 Pressure ulcer of unspecified site, stage 4: Secondary | ICD-10-CM | POA: Diagnosis not present

## 2013-09-01 DIAGNOSIS — N319 Neuromuscular dysfunction of bladder, unspecified: Secondary | ICD-10-CM | POA: Diagnosis not present

## 2013-09-01 DIAGNOSIS — R634 Abnormal weight loss: Secondary | ICD-10-CM | POA: Diagnosis not present

## 2013-09-01 DIAGNOSIS — Z23 Encounter for immunization: Secondary | ICD-10-CM | POA: Diagnosis not present

## 2013-09-01 DIAGNOSIS — G8220 Paraplegia, unspecified: Secondary | ICD-10-CM | POA: Diagnosis not present

## 2013-09-02 DIAGNOSIS — L89109 Pressure ulcer of unspecified part of back, unspecified stage: Secondary | ICD-10-CM | POA: Diagnosis not present

## 2013-09-02 DIAGNOSIS — G8220 Paraplegia, unspecified: Secondary | ICD-10-CM | POA: Diagnosis not present

## 2013-09-02 DIAGNOSIS — L89309 Pressure ulcer of unspecified buttock, unspecified stage: Secondary | ICD-10-CM | POA: Diagnosis not present

## 2013-09-06 DIAGNOSIS — N319 Neuromuscular dysfunction of bladder, unspecified: Secondary | ICD-10-CM | POA: Diagnosis not present

## 2013-09-06 DIAGNOSIS — L89309 Pressure ulcer of unspecified buttock, unspecified stage: Secondary | ICD-10-CM | POA: Diagnosis not present

## 2013-09-06 DIAGNOSIS — IMO0002 Reserved for concepts with insufficient information to code with codable children: Secondary | ICD-10-CM | POA: Diagnosis not present

## 2013-09-06 DIAGNOSIS — L8994 Pressure ulcer of unspecified site, stage 4: Secondary | ICD-10-CM | POA: Diagnosis not present

## 2013-09-06 DIAGNOSIS — L89109 Pressure ulcer of unspecified part of back, unspecified stage: Secondary | ICD-10-CM | POA: Diagnosis not present

## 2013-09-06 DIAGNOSIS — G8220 Paraplegia, unspecified: Secondary | ICD-10-CM | POA: Diagnosis not present

## 2013-09-13 DIAGNOSIS — L89109 Pressure ulcer of unspecified part of back, unspecified stage: Secondary | ICD-10-CM | POA: Diagnosis not present

## 2013-09-13 DIAGNOSIS — IMO0002 Reserved for concepts with insufficient information to code with codable children: Secondary | ICD-10-CM | POA: Diagnosis not present

## 2013-09-13 DIAGNOSIS — L8994 Pressure ulcer of unspecified site, stage 4: Secondary | ICD-10-CM | POA: Diagnosis not present

## 2013-09-13 DIAGNOSIS — G8220 Paraplegia, unspecified: Secondary | ICD-10-CM | POA: Diagnosis not present

## 2013-09-13 DIAGNOSIS — N319 Neuromuscular dysfunction of bladder, unspecified: Secondary | ICD-10-CM | POA: Diagnosis not present

## 2013-09-13 DIAGNOSIS — L89309 Pressure ulcer of unspecified buttock, unspecified stage: Secondary | ICD-10-CM | POA: Diagnosis not present

## 2013-09-16 DIAGNOSIS — G8220 Paraplegia, unspecified: Secondary | ICD-10-CM | POA: Diagnosis not present

## 2013-09-16 DIAGNOSIS — L89309 Pressure ulcer of unspecified buttock, unspecified stage: Secondary | ICD-10-CM | POA: Diagnosis not present

## 2013-09-16 DIAGNOSIS — L89109 Pressure ulcer of unspecified part of back, unspecified stage: Secondary | ICD-10-CM | POA: Diagnosis not present

## 2013-09-20 DIAGNOSIS — L89109 Pressure ulcer of unspecified part of back, unspecified stage: Secondary | ICD-10-CM | POA: Diagnosis not present

## 2013-09-20 DIAGNOSIS — G8220 Paraplegia, unspecified: Secondary | ICD-10-CM | POA: Diagnosis not present

## 2013-09-20 DIAGNOSIS — N319 Neuromuscular dysfunction of bladder, unspecified: Secondary | ICD-10-CM | POA: Diagnosis not present

## 2013-09-20 DIAGNOSIS — L89309 Pressure ulcer of unspecified buttock, unspecified stage: Secondary | ICD-10-CM | POA: Diagnosis not present

## 2013-09-20 DIAGNOSIS — IMO0002 Reserved for concepts with insufficient information to code with codable children: Secondary | ICD-10-CM | POA: Diagnosis not present

## 2013-09-20 DIAGNOSIS — L8994 Pressure ulcer of unspecified site, stage 4: Secondary | ICD-10-CM | POA: Diagnosis not present

## 2013-09-21 DIAGNOSIS — L89109 Pressure ulcer of unspecified part of back, unspecified stage: Secondary | ICD-10-CM | POA: Diagnosis not present

## 2013-09-21 DIAGNOSIS — IMO0002 Reserved for concepts with insufficient information to code with codable children: Secondary | ICD-10-CM | POA: Diagnosis not present

## 2013-09-21 DIAGNOSIS — L8994 Pressure ulcer of unspecified site, stage 4: Secondary | ICD-10-CM | POA: Diagnosis not present

## 2013-09-21 DIAGNOSIS — L89309 Pressure ulcer of unspecified buttock, unspecified stage: Secondary | ICD-10-CM | POA: Diagnosis not present

## 2013-09-21 DIAGNOSIS — G8220 Paraplegia, unspecified: Secondary | ICD-10-CM | POA: Diagnosis not present

## 2013-09-21 DIAGNOSIS — N319 Neuromuscular dysfunction of bladder, unspecified: Secondary | ICD-10-CM | POA: Diagnosis not present

## 2013-09-23 DIAGNOSIS — L89309 Pressure ulcer of unspecified buttock, unspecified stage: Secondary | ICD-10-CM | POA: Diagnosis not present

## 2013-09-23 DIAGNOSIS — G8220 Paraplegia, unspecified: Secondary | ICD-10-CM | POA: Diagnosis not present

## 2013-09-23 DIAGNOSIS — L89109 Pressure ulcer of unspecified part of back, unspecified stage: Secondary | ICD-10-CM | POA: Diagnosis not present

## 2013-09-27 ENCOUNTER — Encounter: Payer: Self-pay | Admitting: Surgery

## 2013-09-27 DIAGNOSIS — IMO0002 Reserved for concepts with insufficient information to code with codable children: Secondary | ICD-10-CM | POA: Diagnosis not present

## 2013-09-27 DIAGNOSIS — L89109 Pressure ulcer of unspecified part of back, unspecified stage: Secondary | ICD-10-CM | POA: Diagnosis not present

## 2013-09-27 DIAGNOSIS — G8220 Paraplegia, unspecified: Secondary | ICD-10-CM | POA: Diagnosis not present

## 2013-09-27 DIAGNOSIS — N319 Neuromuscular dysfunction of bladder, unspecified: Secondary | ICD-10-CM | POA: Diagnosis not present

## 2013-09-27 DIAGNOSIS — L8994 Pressure ulcer of unspecified site, stage 4: Secondary | ICD-10-CM | POA: Diagnosis not present

## 2013-09-27 DIAGNOSIS — L89309 Pressure ulcer of unspecified buttock, unspecified stage: Secondary | ICD-10-CM | POA: Diagnosis not present

## 2013-09-29 DIAGNOSIS — G8220 Paraplegia, unspecified: Secondary | ICD-10-CM | POA: Diagnosis not present

## 2013-09-29 DIAGNOSIS — N319 Neuromuscular dysfunction of bladder, unspecified: Secondary | ICD-10-CM | POA: Diagnosis not present

## 2013-09-29 DIAGNOSIS — L89309 Pressure ulcer of unspecified buttock, unspecified stage: Secondary | ICD-10-CM | POA: Diagnosis not present

## 2013-09-29 DIAGNOSIS — L89109 Pressure ulcer of unspecified part of back, unspecified stage: Secondary | ICD-10-CM | POA: Diagnosis not present

## 2013-09-29 DIAGNOSIS — L8994 Pressure ulcer of unspecified site, stage 4: Secondary | ICD-10-CM | POA: Diagnosis not present

## 2013-09-29 DIAGNOSIS — IMO0002 Reserved for concepts with insufficient information to code with codable children: Secondary | ICD-10-CM | POA: Diagnosis not present

## 2013-10-01 DIAGNOSIS — L8994 Pressure ulcer of unspecified site, stage 4: Secondary | ICD-10-CM | POA: Diagnosis not present

## 2013-10-01 DIAGNOSIS — IMO0002 Reserved for concepts with insufficient information to code with codable children: Secondary | ICD-10-CM | POA: Diagnosis not present

## 2013-10-01 DIAGNOSIS — G8220 Paraplegia, unspecified: Secondary | ICD-10-CM | POA: Diagnosis not present

## 2013-10-01 DIAGNOSIS — L89109 Pressure ulcer of unspecified part of back, unspecified stage: Secondary | ICD-10-CM | POA: Diagnosis not present

## 2013-10-01 DIAGNOSIS — N319 Neuromuscular dysfunction of bladder, unspecified: Secondary | ICD-10-CM | POA: Diagnosis not present

## 2013-10-01 DIAGNOSIS — L89309 Pressure ulcer of unspecified buttock, unspecified stage: Secondary | ICD-10-CM | POA: Diagnosis not present

## 2013-10-04 DIAGNOSIS — IMO0002 Reserved for concepts with insufficient information to code with codable children: Secondary | ICD-10-CM | POA: Diagnosis not present

## 2013-10-04 DIAGNOSIS — L89309 Pressure ulcer of unspecified buttock, unspecified stage: Secondary | ICD-10-CM | POA: Diagnosis not present

## 2013-10-04 DIAGNOSIS — L8994 Pressure ulcer of unspecified site, stage 4: Secondary | ICD-10-CM | POA: Diagnosis not present

## 2013-10-04 DIAGNOSIS — L89109 Pressure ulcer of unspecified part of back, unspecified stage: Secondary | ICD-10-CM | POA: Diagnosis not present

## 2013-10-04 DIAGNOSIS — N319 Neuromuscular dysfunction of bladder, unspecified: Secondary | ICD-10-CM | POA: Diagnosis not present

## 2013-10-04 DIAGNOSIS — G8220 Paraplegia, unspecified: Secondary | ICD-10-CM | POA: Diagnosis not present

## 2013-10-06 DIAGNOSIS — N319 Neuromuscular dysfunction of bladder, unspecified: Secondary | ICD-10-CM | POA: Diagnosis not present

## 2013-10-06 DIAGNOSIS — L89309 Pressure ulcer of unspecified buttock, unspecified stage: Secondary | ICD-10-CM | POA: Diagnosis not present

## 2013-10-06 DIAGNOSIS — G8220 Paraplegia, unspecified: Secondary | ICD-10-CM | POA: Diagnosis not present

## 2013-10-06 DIAGNOSIS — L89109 Pressure ulcer of unspecified part of back, unspecified stage: Secondary | ICD-10-CM | POA: Diagnosis not present

## 2013-10-06 DIAGNOSIS — L8994 Pressure ulcer of unspecified site, stage 4: Secondary | ICD-10-CM | POA: Diagnosis not present

## 2013-10-06 DIAGNOSIS — IMO0002 Reserved for concepts with insufficient information to code with codable children: Secondary | ICD-10-CM | POA: Diagnosis not present

## 2013-10-11 DIAGNOSIS — L89109 Pressure ulcer of unspecified part of back, unspecified stage: Secondary | ICD-10-CM | POA: Diagnosis not present

## 2013-10-11 DIAGNOSIS — N319 Neuromuscular dysfunction of bladder, unspecified: Secondary | ICD-10-CM | POA: Diagnosis not present

## 2013-10-11 DIAGNOSIS — L8994 Pressure ulcer of unspecified site, stage 4: Secondary | ICD-10-CM | POA: Diagnosis not present

## 2013-10-11 DIAGNOSIS — L89309 Pressure ulcer of unspecified buttock, unspecified stage: Secondary | ICD-10-CM | POA: Diagnosis not present

## 2013-10-11 DIAGNOSIS — G8220 Paraplegia, unspecified: Secondary | ICD-10-CM | POA: Diagnosis not present

## 2013-10-11 DIAGNOSIS — IMO0002 Reserved for concepts with insufficient information to code with codable children: Secondary | ICD-10-CM | POA: Diagnosis not present

## 2013-10-13 DIAGNOSIS — L89109 Pressure ulcer of unspecified part of back, unspecified stage: Secondary | ICD-10-CM | POA: Diagnosis not present

## 2013-10-13 DIAGNOSIS — IMO0002 Reserved for concepts with insufficient information to code with codable children: Secondary | ICD-10-CM | POA: Diagnosis not present

## 2013-10-13 DIAGNOSIS — N319 Neuromuscular dysfunction of bladder, unspecified: Secondary | ICD-10-CM | POA: Diagnosis not present

## 2013-10-13 DIAGNOSIS — L8994 Pressure ulcer of unspecified site, stage 4: Secondary | ICD-10-CM | POA: Diagnosis not present

## 2013-10-13 DIAGNOSIS — L89309 Pressure ulcer of unspecified buttock, unspecified stage: Secondary | ICD-10-CM | POA: Diagnosis not present

## 2013-10-13 DIAGNOSIS — G8220 Paraplegia, unspecified: Secondary | ICD-10-CM | POA: Diagnosis not present

## 2013-10-15 DIAGNOSIS — L89309 Pressure ulcer of unspecified buttock, unspecified stage: Secondary | ICD-10-CM | POA: Diagnosis not present

## 2013-10-15 DIAGNOSIS — L8994 Pressure ulcer of unspecified site, stage 4: Secondary | ICD-10-CM | POA: Diagnosis not present

## 2013-10-15 DIAGNOSIS — N319 Neuromuscular dysfunction of bladder, unspecified: Secondary | ICD-10-CM | POA: Diagnosis not present

## 2013-10-15 DIAGNOSIS — L89109 Pressure ulcer of unspecified part of back, unspecified stage: Secondary | ICD-10-CM | POA: Diagnosis not present

## 2013-10-15 DIAGNOSIS — G8220 Paraplegia, unspecified: Secondary | ICD-10-CM | POA: Diagnosis not present

## 2013-10-15 DIAGNOSIS — IMO0002 Reserved for concepts with insufficient information to code with codable children: Secondary | ICD-10-CM | POA: Diagnosis not present

## 2013-10-18 DIAGNOSIS — N319 Neuromuscular dysfunction of bladder, unspecified: Secondary | ICD-10-CM | POA: Diagnosis not present

## 2013-10-18 DIAGNOSIS — IMO0002 Reserved for concepts with insufficient information to code with codable children: Secondary | ICD-10-CM | POA: Diagnosis not present

## 2013-10-18 DIAGNOSIS — L8994 Pressure ulcer of unspecified site, stage 4: Secondary | ICD-10-CM | POA: Diagnosis not present

## 2013-10-18 DIAGNOSIS — L89309 Pressure ulcer of unspecified buttock, unspecified stage: Secondary | ICD-10-CM | POA: Diagnosis not present

## 2013-10-18 DIAGNOSIS — L89109 Pressure ulcer of unspecified part of back, unspecified stage: Secondary | ICD-10-CM | POA: Diagnosis not present

## 2013-10-18 DIAGNOSIS — G8220 Paraplegia, unspecified: Secondary | ICD-10-CM | POA: Diagnosis not present

## 2013-10-22 DIAGNOSIS — G8220 Paraplegia, unspecified: Secondary | ICD-10-CM | POA: Diagnosis not present

## 2013-10-22 DIAGNOSIS — IMO0002 Reserved for concepts with insufficient information to code with codable children: Secondary | ICD-10-CM | POA: Diagnosis not present

## 2013-10-22 DIAGNOSIS — L89109 Pressure ulcer of unspecified part of back, unspecified stage: Secondary | ICD-10-CM | POA: Diagnosis not present

## 2013-10-22 DIAGNOSIS — N319 Neuromuscular dysfunction of bladder, unspecified: Secondary | ICD-10-CM | POA: Diagnosis not present

## 2013-10-22 DIAGNOSIS — L8994 Pressure ulcer of unspecified site, stage 4: Secondary | ICD-10-CM | POA: Diagnosis not present

## 2013-10-22 DIAGNOSIS — L89309 Pressure ulcer of unspecified buttock, unspecified stage: Secondary | ICD-10-CM | POA: Diagnosis not present

## 2013-10-27 ENCOUNTER — Encounter: Payer: Self-pay | Admitting: Surgery

## 2013-10-27 DIAGNOSIS — L89109 Pressure ulcer of unspecified part of back, unspecified stage: Secondary | ICD-10-CM | POA: Diagnosis not present

## 2013-10-27 DIAGNOSIS — G8220 Paraplegia, unspecified: Secondary | ICD-10-CM | POA: Diagnosis not present

## 2013-10-27 DIAGNOSIS — L89309 Pressure ulcer of unspecified buttock, unspecified stage: Secondary | ICD-10-CM | POA: Diagnosis not present

## 2013-10-29 DIAGNOSIS — L89309 Pressure ulcer of unspecified buttock, unspecified stage: Secondary | ICD-10-CM | POA: Diagnosis not present

## 2013-10-29 DIAGNOSIS — N319 Neuromuscular dysfunction of bladder, unspecified: Secondary | ICD-10-CM | POA: Diagnosis not present

## 2013-10-29 DIAGNOSIS — L89109 Pressure ulcer of unspecified part of back, unspecified stage: Secondary | ICD-10-CM | POA: Diagnosis not present

## 2013-10-29 DIAGNOSIS — IMO0002 Reserved for concepts with insufficient information to code with codable children: Secondary | ICD-10-CM | POA: Diagnosis not present

## 2013-10-29 DIAGNOSIS — G8220 Paraplegia, unspecified: Secondary | ICD-10-CM | POA: Diagnosis not present

## 2013-10-29 DIAGNOSIS — L8994 Pressure ulcer of unspecified site, stage 4: Secondary | ICD-10-CM | POA: Diagnosis not present

## 2013-11-01 DIAGNOSIS — N319 Neuromuscular dysfunction of bladder, unspecified: Secondary | ICD-10-CM | POA: Diagnosis not present

## 2013-11-01 DIAGNOSIS — G8220 Paraplegia, unspecified: Secondary | ICD-10-CM | POA: Diagnosis not present

## 2013-11-01 DIAGNOSIS — L8994 Pressure ulcer of unspecified site, stage 4: Secondary | ICD-10-CM | POA: Diagnosis not present

## 2013-11-01 DIAGNOSIS — IMO0002 Reserved for concepts with insufficient information to code with codable children: Secondary | ICD-10-CM | POA: Diagnosis not present

## 2013-11-01 DIAGNOSIS — L89109 Pressure ulcer of unspecified part of back, unspecified stage: Secondary | ICD-10-CM | POA: Diagnosis not present

## 2013-11-01 DIAGNOSIS — L89309 Pressure ulcer of unspecified buttock, unspecified stage: Secondary | ICD-10-CM | POA: Diagnosis not present

## 2013-11-03 DIAGNOSIS — L89109 Pressure ulcer of unspecified part of back, unspecified stage: Secondary | ICD-10-CM | POA: Diagnosis not present

## 2013-11-03 DIAGNOSIS — L89309 Pressure ulcer of unspecified buttock, unspecified stage: Secondary | ICD-10-CM | POA: Diagnosis not present

## 2013-11-03 DIAGNOSIS — N319 Neuromuscular dysfunction of bladder, unspecified: Secondary | ICD-10-CM | POA: Diagnosis not present

## 2013-11-03 DIAGNOSIS — L8994 Pressure ulcer of unspecified site, stage 4: Secondary | ICD-10-CM | POA: Diagnosis not present

## 2013-11-03 DIAGNOSIS — IMO0002 Reserved for concepts with insufficient information to code with codable children: Secondary | ICD-10-CM | POA: Diagnosis not present

## 2013-11-03 DIAGNOSIS — G8220 Paraplegia, unspecified: Secondary | ICD-10-CM | POA: Diagnosis not present

## 2013-11-04 ENCOUNTER — Encounter: Payer: Self-pay | Admitting: General Surgery

## 2013-11-04 DIAGNOSIS — L89109 Pressure ulcer of unspecified part of back, unspecified stage: Secondary | ICD-10-CM | POA: Diagnosis not present

## 2013-11-04 DIAGNOSIS — L89309 Pressure ulcer of unspecified buttock, unspecified stage: Secondary | ICD-10-CM | POA: Diagnosis not present

## 2013-11-04 DIAGNOSIS — G8220 Paraplegia, unspecified: Secondary | ICD-10-CM | POA: Diagnosis not present

## 2013-11-05 DIAGNOSIS — N319 Neuromuscular dysfunction of bladder, unspecified: Secondary | ICD-10-CM | POA: Diagnosis not present

## 2013-11-05 DIAGNOSIS — L89109 Pressure ulcer of unspecified part of back, unspecified stage: Secondary | ICD-10-CM | POA: Diagnosis not present

## 2013-11-05 DIAGNOSIS — L8994 Pressure ulcer of unspecified site, stage 4: Secondary | ICD-10-CM | POA: Diagnosis not present

## 2013-11-05 DIAGNOSIS — IMO0002 Reserved for concepts with insufficient information to code with codable children: Secondary | ICD-10-CM | POA: Diagnosis not present

## 2013-11-05 DIAGNOSIS — L89309 Pressure ulcer of unspecified buttock, unspecified stage: Secondary | ICD-10-CM | POA: Diagnosis not present

## 2013-11-05 DIAGNOSIS — G8220 Paraplegia, unspecified: Secondary | ICD-10-CM | POA: Diagnosis not present

## 2013-11-08 DIAGNOSIS — L8994 Pressure ulcer of unspecified site, stage 4: Secondary | ICD-10-CM | POA: Diagnosis not present

## 2013-11-08 DIAGNOSIS — IMO0002 Reserved for concepts with insufficient information to code with codable children: Secondary | ICD-10-CM | POA: Diagnosis not present

## 2013-11-08 DIAGNOSIS — G8220 Paraplegia, unspecified: Secondary | ICD-10-CM | POA: Diagnosis not present

## 2013-11-08 DIAGNOSIS — L89109 Pressure ulcer of unspecified part of back, unspecified stage: Secondary | ICD-10-CM | POA: Diagnosis not present

## 2013-11-08 DIAGNOSIS — N319 Neuromuscular dysfunction of bladder, unspecified: Secondary | ICD-10-CM | POA: Diagnosis not present

## 2013-11-08 DIAGNOSIS — L89309 Pressure ulcer of unspecified buttock, unspecified stage: Secondary | ICD-10-CM | POA: Diagnosis not present

## 2013-11-10 DIAGNOSIS — N319 Neuromuscular dysfunction of bladder, unspecified: Secondary | ICD-10-CM | POA: Diagnosis not present

## 2013-11-10 DIAGNOSIS — G8220 Paraplegia, unspecified: Secondary | ICD-10-CM | POA: Diagnosis not present

## 2013-11-10 DIAGNOSIS — IMO0002 Reserved for concepts with insufficient information to code with codable children: Secondary | ICD-10-CM | POA: Diagnosis not present

## 2013-11-10 DIAGNOSIS — L8994 Pressure ulcer of unspecified site, stage 4: Secondary | ICD-10-CM | POA: Diagnosis not present

## 2013-11-10 DIAGNOSIS — L89309 Pressure ulcer of unspecified buttock, unspecified stage: Secondary | ICD-10-CM | POA: Diagnosis not present

## 2013-11-10 DIAGNOSIS — L89109 Pressure ulcer of unspecified part of back, unspecified stage: Secondary | ICD-10-CM | POA: Diagnosis not present

## 2013-11-12 DIAGNOSIS — L89309 Pressure ulcer of unspecified buttock, unspecified stage: Secondary | ICD-10-CM | POA: Diagnosis not present

## 2013-11-12 DIAGNOSIS — IMO0002 Reserved for concepts with insufficient information to code with codable children: Secondary | ICD-10-CM | POA: Diagnosis not present

## 2013-11-12 DIAGNOSIS — L8994 Pressure ulcer of unspecified site, stage 4: Secondary | ICD-10-CM | POA: Diagnosis not present

## 2013-11-12 DIAGNOSIS — N319 Neuromuscular dysfunction of bladder, unspecified: Secondary | ICD-10-CM | POA: Diagnosis not present

## 2013-11-12 DIAGNOSIS — G8220 Paraplegia, unspecified: Secondary | ICD-10-CM | POA: Diagnosis not present

## 2013-11-12 DIAGNOSIS — L89109 Pressure ulcer of unspecified part of back, unspecified stage: Secondary | ICD-10-CM | POA: Diagnosis not present

## 2013-11-18 ENCOUNTER — Encounter: Payer: Self-pay | Admitting: Surgery

## 2013-11-18 DIAGNOSIS — L89109 Pressure ulcer of unspecified part of back, unspecified stage: Secondary | ICD-10-CM | POA: Diagnosis not present

## 2013-11-18 DIAGNOSIS — L89309 Pressure ulcer of unspecified buttock, unspecified stage: Secondary | ICD-10-CM | POA: Diagnosis not present

## 2013-11-18 DIAGNOSIS — G8220 Paraplegia, unspecified: Secondary | ICD-10-CM | POA: Diagnosis not present

## 2013-11-27 ENCOUNTER — Encounter: Payer: Self-pay | Admitting: General Surgery

## 2013-11-27 ENCOUNTER — Encounter: Payer: Self-pay | Admitting: Surgery

## 2013-12-02 ENCOUNTER — Ambulatory Visit: Payer: Self-pay | Admitting: Surgery

## 2013-12-02 ENCOUNTER — Encounter: Payer: Self-pay | Admitting: Surgery

## 2013-12-02 DIAGNOSIS — L89109 Pressure ulcer of unspecified part of back, unspecified stage: Secondary | ICD-10-CM | POA: Diagnosis not present

## 2013-12-02 DIAGNOSIS — S91109A Unspecified open wound of unspecified toe(s) without damage to nail, initial encounter: Secondary | ICD-10-CM | POA: Diagnosis not present

## 2013-12-02 DIAGNOSIS — G8220 Paraplegia, unspecified: Secondary | ICD-10-CM | POA: Diagnosis not present

## 2013-12-02 DIAGNOSIS — L89309 Pressure ulcer of unspecified buttock, unspecified stage: Secondary | ICD-10-CM | POA: Diagnosis not present

## 2013-12-06 DIAGNOSIS — L89309 Pressure ulcer of unspecified buttock, unspecified stage: Secondary | ICD-10-CM | POA: Diagnosis not present

## 2013-12-06 DIAGNOSIS — Z993 Dependence on wheelchair: Secondary | ICD-10-CM | POA: Diagnosis not present

## 2013-12-06 DIAGNOSIS — S91109A Unspecified open wound of unspecified toe(s) without damage to nail, initial encounter: Secondary | ICD-10-CM | POA: Diagnosis not present

## 2013-12-06 DIAGNOSIS — G8220 Paraplegia, unspecified: Secondary | ICD-10-CM | POA: Diagnosis not present

## 2013-12-06 DIAGNOSIS — L89109 Pressure ulcer of unspecified part of back, unspecified stage: Secondary | ICD-10-CM | POA: Diagnosis not present

## 2013-12-06 DIAGNOSIS — L8994 Pressure ulcer of unspecified site, stage 4: Secondary | ICD-10-CM | POA: Diagnosis not present

## 2013-12-08 DIAGNOSIS — S91109A Unspecified open wound of unspecified toe(s) without damage to nail, initial encounter: Secondary | ICD-10-CM | POA: Diagnosis not present

## 2013-12-08 DIAGNOSIS — L89109 Pressure ulcer of unspecified part of back, unspecified stage: Secondary | ICD-10-CM | POA: Diagnosis not present

## 2013-12-08 DIAGNOSIS — G8220 Paraplegia, unspecified: Secondary | ICD-10-CM | POA: Diagnosis not present

## 2013-12-08 DIAGNOSIS — Z993 Dependence on wheelchair: Secondary | ICD-10-CM | POA: Diagnosis not present

## 2013-12-08 DIAGNOSIS — L8994 Pressure ulcer of unspecified site, stage 4: Secondary | ICD-10-CM | POA: Diagnosis not present

## 2013-12-08 DIAGNOSIS — L89309 Pressure ulcer of unspecified buttock, unspecified stage: Secondary | ICD-10-CM | POA: Diagnosis not present

## 2013-12-13 DIAGNOSIS — G8220 Paraplegia, unspecified: Secondary | ICD-10-CM | POA: Diagnosis not present

## 2013-12-13 DIAGNOSIS — Z993 Dependence on wheelchair: Secondary | ICD-10-CM | POA: Diagnosis not present

## 2013-12-13 DIAGNOSIS — L89309 Pressure ulcer of unspecified buttock, unspecified stage: Secondary | ICD-10-CM | POA: Diagnosis not present

## 2013-12-13 DIAGNOSIS — L89109 Pressure ulcer of unspecified part of back, unspecified stage: Secondary | ICD-10-CM | POA: Diagnosis not present

## 2013-12-13 DIAGNOSIS — S91109A Unspecified open wound of unspecified toe(s) without damage to nail, initial encounter: Secondary | ICD-10-CM | POA: Diagnosis not present

## 2013-12-13 DIAGNOSIS — L8994 Pressure ulcer of unspecified site, stage 4: Secondary | ICD-10-CM | POA: Diagnosis not present

## 2013-12-15 DIAGNOSIS — L8994 Pressure ulcer of unspecified site, stage 4: Secondary | ICD-10-CM | POA: Diagnosis not present

## 2013-12-15 DIAGNOSIS — Z993 Dependence on wheelchair: Secondary | ICD-10-CM | POA: Diagnosis not present

## 2013-12-15 DIAGNOSIS — G8220 Paraplegia, unspecified: Secondary | ICD-10-CM | POA: Diagnosis not present

## 2013-12-15 DIAGNOSIS — L89309 Pressure ulcer of unspecified buttock, unspecified stage: Secondary | ICD-10-CM | POA: Diagnosis not present

## 2013-12-15 DIAGNOSIS — L89109 Pressure ulcer of unspecified part of back, unspecified stage: Secondary | ICD-10-CM | POA: Diagnosis not present

## 2013-12-15 DIAGNOSIS — S91109A Unspecified open wound of unspecified toe(s) without damage to nail, initial encounter: Secondary | ICD-10-CM | POA: Diagnosis not present

## 2013-12-16 DIAGNOSIS — G8220 Paraplegia, unspecified: Secondary | ICD-10-CM | POA: Diagnosis not present

## 2013-12-16 DIAGNOSIS — L89109 Pressure ulcer of unspecified part of back, unspecified stage: Secondary | ICD-10-CM | POA: Diagnosis not present

## 2013-12-16 DIAGNOSIS — L89309 Pressure ulcer of unspecified buttock, unspecified stage: Secondary | ICD-10-CM | POA: Diagnosis not present

## 2013-12-20 DIAGNOSIS — L89309 Pressure ulcer of unspecified buttock, unspecified stage: Secondary | ICD-10-CM | POA: Diagnosis not present

## 2013-12-20 DIAGNOSIS — L89109 Pressure ulcer of unspecified part of back, unspecified stage: Secondary | ICD-10-CM | POA: Diagnosis not present

## 2013-12-20 DIAGNOSIS — G8220 Paraplegia, unspecified: Secondary | ICD-10-CM | POA: Diagnosis not present

## 2013-12-20 DIAGNOSIS — S91109A Unspecified open wound of unspecified toe(s) without damage to nail, initial encounter: Secondary | ICD-10-CM | POA: Diagnosis not present

## 2013-12-20 DIAGNOSIS — Z993 Dependence on wheelchair: Secondary | ICD-10-CM | POA: Diagnosis not present

## 2013-12-20 DIAGNOSIS — L8994 Pressure ulcer of unspecified site, stage 4: Secondary | ICD-10-CM | POA: Diagnosis not present

## 2013-12-24 ENCOUNTER — Inpatient Hospital Stay: Payer: Self-pay | Admitting: Specialist

## 2013-12-24 DIAGNOSIS — R031 Nonspecific low blood-pressure reading: Secondary | ICD-10-CM | POA: Diagnosis not present

## 2013-12-24 DIAGNOSIS — R059 Cough, unspecified: Secondary | ICD-10-CM | POA: Diagnosis not present

## 2013-12-24 DIAGNOSIS — L97509 Non-pressure chronic ulcer of other part of unspecified foot with unspecified severity: Secondary | ICD-10-CM | POA: Diagnosis not present

## 2013-12-24 DIAGNOSIS — R609 Edema, unspecified: Secondary | ICD-10-CM | POA: Diagnosis not present

## 2013-12-24 DIAGNOSIS — N319 Neuromuscular dysfunction of bladder, unspecified: Secondary | ICD-10-CM | POA: Diagnosis present

## 2013-12-24 DIAGNOSIS — E876 Hypokalemia: Secondary | ICD-10-CM | POA: Diagnosis not present

## 2013-12-24 DIAGNOSIS — L02619 Cutaneous abscess of unspecified foot: Secondary | ICD-10-CM | POA: Diagnosis not present

## 2013-12-24 DIAGNOSIS — F17200 Nicotine dependence, unspecified, uncomplicated: Secondary | ICD-10-CM | POA: Diagnosis present

## 2013-12-24 DIAGNOSIS — R6510 Systemic inflammatory response syndrome (SIRS) of non-infectious origin without acute organ dysfunction: Secondary | ICD-10-CM | POA: Diagnosis not present

## 2013-12-24 DIAGNOSIS — L8990 Pressure ulcer of unspecified site, unspecified stage: Secondary | ICD-10-CM | POA: Diagnosis not present

## 2013-12-24 DIAGNOSIS — Z993 Dependence on wheelchair: Secondary | ICD-10-CM | POA: Diagnosis not present

## 2013-12-24 DIAGNOSIS — S91309A Unspecified open wound, unspecified foot, initial encounter: Secondary | ICD-10-CM | POA: Diagnosis not present

## 2013-12-24 DIAGNOSIS — B954 Other streptococcus as the cause of diseases classified elsewhere: Secondary | ICD-10-CM | POA: Diagnosis present

## 2013-12-24 DIAGNOSIS — M869 Osteomyelitis, unspecified: Secondary | ICD-10-CM | POA: Diagnosis present

## 2013-12-24 DIAGNOSIS — L89309 Pressure ulcer of unspecified buttock, unspecified stage: Secondary | ICD-10-CM | POA: Diagnosis not present

## 2013-12-24 DIAGNOSIS — E43 Unspecified severe protein-calorie malnutrition: Secondary | ICD-10-CM | POA: Diagnosis present

## 2013-12-24 DIAGNOSIS — A419 Sepsis, unspecified organism: Secondary | ICD-10-CM | POA: Diagnosis not present

## 2013-12-24 DIAGNOSIS — M86679 Other chronic osteomyelitis, unspecified ankle and foot: Secondary | ICD-10-CM | POA: Diagnosis not present

## 2013-12-24 DIAGNOSIS — Z681 Body mass index (BMI) 19 or less, adult: Secondary | ICD-10-CM | POA: Diagnosis not present

## 2013-12-24 DIAGNOSIS — M8618 Other acute osteomyelitis, other site: Secondary | ICD-10-CM | POA: Diagnosis not present

## 2013-12-24 DIAGNOSIS — R509 Fever, unspecified: Secondary | ICD-10-CM | POA: Diagnosis not present

## 2013-12-24 DIAGNOSIS — L89109 Pressure ulcer of unspecified part of back, unspecified stage: Secondary | ICD-10-CM | POA: Diagnosis not present

## 2013-12-24 DIAGNOSIS — M86179 Other acute osteomyelitis, unspecified ankle and foot: Secondary | ICD-10-CM | POA: Diagnosis not present

## 2013-12-24 DIAGNOSIS — A4901 Methicillin susceptible Staphylococcus aureus infection, unspecified site: Secondary | ICD-10-CM | POA: Diagnosis present

## 2013-12-24 DIAGNOSIS — B964 Proteus (mirabilis) (morganii) as the cause of diseases classified elsewhere: Secondary | ICD-10-CM | POA: Diagnosis present

## 2013-12-24 DIAGNOSIS — L8994 Pressure ulcer of unspecified site, stage 4: Secondary | ICD-10-CM | POA: Diagnosis present

## 2013-12-24 DIAGNOSIS — N390 Urinary tract infection, site not specified: Secondary | ICD-10-CM | POA: Diagnosis not present

## 2013-12-24 DIAGNOSIS — G8220 Paraplegia, unspecified: Secondary | ICD-10-CM | POA: Diagnosis present

## 2013-12-24 DIAGNOSIS — M2450 Contracture, unspecified joint: Secondary | ICD-10-CM | POA: Diagnosis present

## 2013-12-24 DIAGNOSIS — L03039 Cellulitis of unspecified toe: Secondary | ICD-10-CM | POA: Diagnosis not present

## 2013-12-24 LAB — BASIC METABOLIC PANEL
Anion Gap: 8 (ref 7–16)
BUN: 10 mg/dL (ref 7–18)
CO2: 28 mmol/L (ref 21–32)
Calcium, Total: 8.6 mg/dL (ref 8.5–10.1)
Chloride: 102 mmol/L (ref 98–107)
Creatinine: 0.65 mg/dL (ref 0.60–1.30)
Glucose: 123 mg/dL — ABNORMAL HIGH (ref 65–99)
OSMOLALITY: 276 (ref 275–301)
Potassium: 3.3 mmol/L — ABNORMAL LOW (ref 3.5–5.1)
Sodium: 138 mmol/L (ref 136–145)

## 2013-12-24 LAB — URINALYSIS, COMPLETE
BILIRUBIN, UR: NEGATIVE
GLUCOSE, UR: NEGATIVE mg/dL (ref 0–75)
NITRITE: POSITIVE
PH: 6 (ref 4.5–8.0)
Protein: 30
RBC,UR: 6 /HPF (ref 0–5)
Specific Gravity: 1.021 (ref 1.003–1.030)
Squamous Epithelial: 1
WBC UR: 21 /HPF (ref 0–5)

## 2013-12-24 LAB — CBC WITH DIFFERENTIAL/PLATELET
Basophil #: 0.1 10*3/uL (ref 0.0–0.1)
Basophil %: 1.3 %
Eosinophil #: 0.1 10*3/uL (ref 0.0–0.7)
Eosinophil %: 0.8 %
HCT: 33.1 % — ABNORMAL LOW (ref 40.0–52.0)
HGB: 10.4 g/dL — AB (ref 13.0–18.0)
LYMPHS PCT: 18.9 %
Lymphocyte #: 2 10*3/uL (ref 1.0–3.6)
MCH: 22.6 pg — AB (ref 26.0–34.0)
MCHC: 31.4 g/dL — ABNORMAL LOW (ref 32.0–36.0)
MCV: 72 fL — AB (ref 80–100)
MONOS PCT: 9.9 %
Monocyte #: 1 x10 3/mm (ref 0.2–1.0)
Neutrophil #: 7.3 10*3/uL — ABNORMAL HIGH (ref 1.4–6.5)
Neutrophil %: 69.1 %
Platelet: 364 10*3/uL (ref 150–440)
RBC: 4.6 10*6/uL (ref 4.40–5.90)
RDW: 17.6 % — ABNORMAL HIGH (ref 11.5–14.5)
WBC: 10.5 10*3/uL (ref 3.8–10.6)

## 2013-12-24 LAB — TROPONIN I: Troponin-I: <0.02 ng/mL

## 2013-12-25 LAB — CBC WITH DIFFERENTIAL/PLATELET
BASOS ABS: 0.1 10*3/uL (ref 0.0–0.1)
Basophil %: 1.2 %
Eosinophil #: 0.3 10*3/uL (ref 0.0–0.7)
Eosinophil %: 6.4 %
HCT: 28.3 % — ABNORMAL LOW (ref 40.0–52.0)
HGB: 8.6 g/dL — AB (ref 13.0–18.0)
Lymphocyte #: 1.2 10*3/uL (ref 1.0–3.6)
Lymphocyte %: 23.7 %
MCH: 22.3 pg — ABNORMAL LOW (ref 26.0–34.0)
MCHC: 30.5 g/dL — ABNORMAL LOW (ref 32.0–36.0)
MCV: 73 fL — ABNORMAL LOW (ref 80–100)
Monocyte #: 0.5 x10 3/mm (ref 0.2–1.0)
Monocyte %: 8.9 %
NEUTROS PCT: 59.8 %
Neutrophil #: 3.1 10*3/uL (ref 1.4–6.5)
PLATELETS: 349 10*3/uL (ref 150–440)
RBC: 3.88 10*6/uL — AB (ref 4.40–5.90)
RDW: 18 % — AB (ref 11.5–14.5)
WBC: 5.2 10*3/uL (ref 3.8–10.6)

## 2013-12-25 LAB — VANCOMYCIN, TROUGH: Vancomycin, Trough: 8 ug/mL — ABNORMAL LOW (ref 10–20)

## 2013-12-25 LAB — BASIC METABOLIC PANEL
Anion Gap: 6 — ABNORMAL LOW (ref 7–16)
BUN: 7 mg/dL (ref 7–18)
Calcium, Total: 8.4 mg/dL — ABNORMAL LOW (ref 8.5–10.1)
Chloride: 110 mmol/L — ABNORMAL HIGH (ref 98–107)
Co2: 26 mmol/L (ref 21–32)
Creatinine: 0.64 mg/dL (ref 0.60–1.30)
EGFR (African American): >60
EGFR (Non-African Amer.): >60
Glucose: 91 mg/dL (ref 65–99)
Osmolality: 281 (ref 275–301)
POTASSIUM: 4 mmol/L (ref 3.5–5.1)
Sodium: 142 mmol/L (ref 136–145)

## 2013-12-25 LAB — SEDIMENTATION RATE: Erythrocyte Sed Rate: 65 mm/hr — ABNORMAL HIGH (ref 0–15)

## 2013-12-25 LAB — MAGNESIUM: Magnesium: 2.1 mg/dL

## 2013-12-27 LAB — VANCOMYCIN, TROUGH: VANCOMYCIN, TROUGH: 15 ug/mL (ref 10–20)

## 2013-12-29 LAB — CULTURE, BLOOD (SINGLE)

## 2013-12-30 LAB — PLATELET COUNT: Platelet: 488 10*3/uL — ABNORMAL HIGH (ref 150–440)

## 2013-12-30 LAB — CREATININE, SERUM
Creatinine: 0.74 mg/dL (ref 0.60–1.30)
EGFR (African American): >60
EGFR (Non-African Amer.): >60

## 2013-12-30 LAB — WOUND CULTURE

## 2013-12-30 LAB — PATHOLOGY REPORT

## 2013-12-31 DIAGNOSIS — G8220 Paraplegia, unspecified: Secondary | ICD-10-CM | POA: Diagnosis not present

## 2013-12-31 DIAGNOSIS — Z993 Dependence on wheelchair: Secondary | ICD-10-CM | POA: Diagnosis not present

## 2013-12-31 DIAGNOSIS — S91109A Unspecified open wound of unspecified toe(s) without damage to nail, initial encounter: Secondary | ICD-10-CM | POA: Diagnosis not present

## 2013-12-31 DIAGNOSIS — L8994 Pressure ulcer of unspecified site, stage 4: Secondary | ICD-10-CM | POA: Diagnosis not present

## 2013-12-31 DIAGNOSIS — L89309 Pressure ulcer of unspecified buttock, unspecified stage: Secondary | ICD-10-CM | POA: Diagnosis not present

## 2013-12-31 DIAGNOSIS — L89109 Pressure ulcer of unspecified part of back, unspecified stage: Secondary | ICD-10-CM | POA: Diagnosis not present

## 2014-01-01 LAB — WOUND CULTURE

## 2014-01-03 DIAGNOSIS — L89109 Pressure ulcer of unspecified part of back, unspecified stage: Secondary | ICD-10-CM | POA: Diagnosis not present

## 2014-01-03 DIAGNOSIS — L8994 Pressure ulcer of unspecified site, stage 4: Secondary | ICD-10-CM | POA: Diagnosis not present

## 2014-01-03 DIAGNOSIS — L89309 Pressure ulcer of unspecified buttock, unspecified stage: Secondary | ICD-10-CM | POA: Diagnosis not present

## 2014-01-03 DIAGNOSIS — S91109A Unspecified open wound of unspecified toe(s) without damage to nail, initial encounter: Secondary | ICD-10-CM | POA: Diagnosis not present

## 2014-01-03 DIAGNOSIS — G8220 Paraplegia, unspecified: Secondary | ICD-10-CM | POA: Diagnosis not present

## 2014-01-03 DIAGNOSIS — Z993 Dependence on wheelchair: Secondary | ICD-10-CM | POA: Diagnosis not present

## 2014-01-05 DIAGNOSIS — L8994 Pressure ulcer of unspecified site, stage 4: Secondary | ICD-10-CM | POA: Diagnosis not present

## 2014-01-05 DIAGNOSIS — L89109 Pressure ulcer of unspecified part of back, unspecified stage: Secondary | ICD-10-CM | POA: Diagnosis not present

## 2014-01-05 DIAGNOSIS — G8220 Paraplegia, unspecified: Secondary | ICD-10-CM | POA: Diagnosis not present

## 2014-01-05 DIAGNOSIS — S91109A Unspecified open wound of unspecified toe(s) without damage to nail, initial encounter: Secondary | ICD-10-CM | POA: Diagnosis not present

## 2014-01-05 DIAGNOSIS — Z993 Dependence on wheelchair: Secondary | ICD-10-CM | POA: Diagnosis not present

## 2014-01-05 DIAGNOSIS — L89309 Pressure ulcer of unspecified buttock, unspecified stage: Secondary | ICD-10-CM | POA: Diagnosis not present

## 2014-01-07 DIAGNOSIS — L89109 Pressure ulcer of unspecified part of back, unspecified stage: Secondary | ICD-10-CM | POA: Diagnosis not present

## 2014-01-07 DIAGNOSIS — G8220 Paraplegia, unspecified: Secondary | ICD-10-CM | POA: Diagnosis not present

## 2014-01-07 DIAGNOSIS — S91109A Unspecified open wound of unspecified toe(s) without damage to nail, initial encounter: Secondary | ICD-10-CM | POA: Diagnosis not present

## 2014-01-07 DIAGNOSIS — L8994 Pressure ulcer of unspecified site, stage 4: Secondary | ICD-10-CM | POA: Diagnosis not present

## 2014-01-07 DIAGNOSIS — Z993 Dependence on wheelchair: Secondary | ICD-10-CM | POA: Diagnosis not present

## 2014-01-07 DIAGNOSIS — L89309 Pressure ulcer of unspecified buttock, unspecified stage: Secondary | ICD-10-CM | POA: Diagnosis not present

## 2014-01-10 DIAGNOSIS — L89309 Pressure ulcer of unspecified buttock, unspecified stage: Secondary | ICD-10-CM | POA: Diagnosis not present

## 2014-01-10 DIAGNOSIS — S91109A Unspecified open wound of unspecified toe(s) without damage to nail, initial encounter: Secondary | ICD-10-CM | POA: Diagnosis not present

## 2014-01-10 DIAGNOSIS — L8994 Pressure ulcer of unspecified site, stage 4: Secondary | ICD-10-CM | POA: Diagnosis not present

## 2014-01-10 DIAGNOSIS — Z993 Dependence on wheelchair: Secondary | ICD-10-CM | POA: Diagnosis not present

## 2014-01-10 DIAGNOSIS — L89109 Pressure ulcer of unspecified part of back, unspecified stage: Secondary | ICD-10-CM | POA: Diagnosis not present

## 2014-01-10 DIAGNOSIS — G8220 Paraplegia, unspecified: Secondary | ICD-10-CM | POA: Diagnosis not present

## 2014-01-12 DIAGNOSIS — S91109A Unspecified open wound of unspecified toe(s) without damage to nail, initial encounter: Secondary | ICD-10-CM | POA: Diagnosis not present

## 2014-01-12 DIAGNOSIS — L89109 Pressure ulcer of unspecified part of back, unspecified stage: Secondary | ICD-10-CM | POA: Diagnosis not present

## 2014-01-12 DIAGNOSIS — Z993 Dependence on wheelchair: Secondary | ICD-10-CM | POA: Diagnosis not present

## 2014-01-12 DIAGNOSIS — L8994 Pressure ulcer of unspecified site, stage 4: Secondary | ICD-10-CM | POA: Diagnosis not present

## 2014-01-12 DIAGNOSIS — G8220 Paraplegia, unspecified: Secondary | ICD-10-CM | POA: Diagnosis not present

## 2014-01-12 DIAGNOSIS — L89309 Pressure ulcer of unspecified buttock, unspecified stage: Secondary | ICD-10-CM | POA: Diagnosis not present

## 2014-01-14 DIAGNOSIS — Z993 Dependence on wheelchair: Secondary | ICD-10-CM | POA: Diagnosis not present

## 2014-01-14 DIAGNOSIS — L89109 Pressure ulcer of unspecified part of back, unspecified stage: Secondary | ICD-10-CM | POA: Diagnosis not present

## 2014-01-14 DIAGNOSIS — G8220 Paraplegia, unspecified: Secondary | ICD-10-CM | POA: Diagnosis not present

## 2014-01-14 DIAGNOSIS — S91109A Unspecified open wound of unspecified toe(s) without damage to nail, initial encounter: Secondary | ICD-10-CM | POA: Diagnosis not present

## 2014-01-14 DIAGNOSIS — L89309 Pressure ulcer of unspecified buttock, unspecified stage: Secondary | ICD-10-CM | POA: Diagnosis not present

## 2014-01-14 DIAGNOSIS — L8994 Pressure ulcer of unspecified site, stage 4: Secondary | ICD-10-CM | POA: Diagnosis not present

## 2014-01-17 DIAGNOSIS — L8994 Pressure ulcer of unspecified site, stage 4: Secondary | ICD-10-CM | POA: Diagnosis not present

## 2014-01-17 DIAGNOSIS — S91109A Unspecified open wound of unspecified toe(s) without damage to nail, initial encounter: Secondary | ICD-10-CM | POA: Diagnosis not present

## 2014-01-17 DIAGNOSIS — G8220 Paraplegia, unspecified: Secondary | ICD-10-CM | POA: Diagnosis not present

## 2014-01-17 DIAGNOSIS — Z993 Dependence on wheelchair: Secondary | ICD-10-CM | POA: Diagnosis not present

## 2014-01-17 DIAGNOSIS — L89109 Pressure ulcer of unspecified part of back, unspecified stage: Secondary | ICD-10-CM | POA: Diagnosis not present

## 2014-01-17 DIAGNOSIS — L89309 Pressure ulcer of unspecified buttock, unspecified stage: Secondary | ICD-10-CM | POA: Diagnosis not present

## 2014-01-19 DIAGNOSIS — Z993 Dependence on wheelchair: Secondary | ICD-10-CM | POA: Diagnosis not present

## 2014-01-19 DIAGNOSIS — G8220 Paraplegia, unspecified: Secondary | ICD-10-CM | POA: Diagnosis not present

## 2014-01-19 DIAGNOSIS — L8994 Pressure ulcer of unspecified site, stage 4: Secondary | ICD-10-CM | POA: Diagnosis not present

## 2014-01-19 DIAGNOSIS — S91109A Unspecified open wound of unspecified toe(s) without damage to nail, initial encounter: Secondary | ICD-10-CM | POA: Diagnosis not present

## 2014-01-19 DIAGNOSIS — L89309 Pressure ulcer of unspecified buttock, unspecified stage: Secondary | ICD-10-CM | POA: Diagnosis not present

## 2014-01-19 DIAGNOSIS — L89109 Pressure ulcer of unspecified part of back, unspecified stage: Secondary | ICD-10-CM | POA: Diagnosis not present

## 2014-01-20 ENCOUNTER — Encounter: Payer: Self-pay | Admitting: Surgery

## 2014-01-20 DIAGNOSIS — S31000A Unspecified open wound of lower back and pelvis without penetration into retroperitoneum, initial encounter: Secondary | ICD-10-CM | POA: Diagnosis not present

## 2014-01-20 DIAGNOSIS — G834 Cauda equina syndrome: Secondary | ICD-10-CM | POA: Diagnosis not present

## 2014-01-20 DIAGNOSIS — L89109 Pressure ulcer of unspecified part of back, unspecified stage: Secondary | ICD-10-CM | POA: Diagnosis not present

## 2014-01-20 DIAGNOSIS — L97909 Non-pressure chronic ulcer of unspecified part of unspecified lower leg with unspecified severity: Secondary | ICD-10-CM | POA: Diagnosis not present

## 2014-01-20 DIAGNOSIS — E46 Unspecified protein-calorie malnutrition: Secondary | ICD-10-CM | POA: Diagnosis not present

## 2014-01-20 DIAGNOSIS — L89324 Pressure ulcer of left buttock, stage 4: Secondary | ICD-10-CM | POA: Diagnosis present

## 2014-01-20 DIAGNOSIS — L89309 Pressure ulcer of unspecified buttock, unspecified stage: Secondary | ICD-10-CM | POA: Diagnosis not present

## 2014-01-20 DIAGNOSIS — L89314 Pressure ulcer of right buttock, stage 4: Secondary | ICD-10-CM | POA: Diagnosis present

## 2014-01-20 DIAGNOSIS — M869 Osteomyelitis, unspecified: Secondary | ICD-10-CM | POA: Diagnosis not present

## 2014-01-20 DIAGNOSIS — N3945 Continuous leakage: Secondary | ICD-10-CM | POA: Diagnosis present

## 2014-01-20 DIAGNOSIS — E43 Unspecified severe protein-calorie malnutrition: Secondary | ICD-10-CM | POA: Diagnosis present

## 2014-01-20 DIAGNOSIS — F17200 Nicotine dependence, unspecified, uncomplicated: Secondary | ICD-10-CM | POA: Diagnosis not present

## 2014-01-20 DIAGNOSIS — G894 Chronic pain syndrome: Secondary | ICD-10-CM | POA: Diagnosis present

## 2014-01-20 DIAGNOSIS — Z720 Tobacco use: Secondary | ICD-10-CM | POA: Diagnosis not present

## 2014-01-20 DIAGNOSIS — D509 Iron deficiency anemia, unspecified: Secondary | ICD-10-CM | POA: Diagnosis not present

## 2014-01-20 DIAGNOSIS — D638 Anemia in other chronic diseases classified elsewhere: Secondary | ICD-10-CM | POA: Diagnosis present

## 2014-01-20 DIAGNOSIS — L89154 Pressure ulcer of sacral region, stage 4: Secondary | ICD-10-CM | POA: Diagnosis present

## 2014-01-20 DIAGNOSIS — K5900 Constipation, unspecified: Secondary | ICD-10-CM | POA: Diagnosis present

## 2014-01-20 DIAGNOSIS — L8990 Pressure ulcer of unspecified site, unspecified stage: Secondary | ICD-10-CM | POA: Diagnosis not present

## 2014-01-20 DIAGNOSIS — R339 Retention of urine, unspecified: Secondary | ICD-10-CM | POA: Diagnosis not present

## 2014-01-20 DIAGNOSIS — N39498 Other specified urinary incontinence: Secondary | ICD-10-CM | POA: Diagnosis not present

## 2014-01-20 DIAGNOSIS — R29818 Other symptoms and signs involving the nervous system: Secondary | ICD-10-CM | POA: Diagnosis not present

## 2014-01-20 DIAGNOSIS — Z89422 Acquired absence of other left toe(s): Secondary | ICD-10-CM | POA: Diagnosis not present

## 2014-01-20 DIAGNOSIS — M8668 Other chronic osteomyelitis, other site: Secondary | ICD-10-CM | POA: Diagnosis present

## 2014-01-20 DIAGNOSIS — G8220 Paraplegia, unspecified: Secondary | ICD-10-CM | POA: Diagnosis not present

## 2014-01-27 ENCOUNTER — Encounter: Payer: Self-pay | Admitting: Surgery

## 2014-02-01 ENCOUNTER — Ambulatory Visit: Payer: Self-pay | Admitting: Physician Assistant

## 2014-02-01 DIAGNOSIS — M869 Osteomyelitis, unspecified: Secondary | ICD-10-CM | POA: Diagnosis not present

## 2014-02-01 DIAGNOSIS — L509 Urticaria, unspecified: Secondary | ICD-10-CM | POA: Diagnosis not present

## 2014-02-10 DIAGNOSIS — G8220 Paraplegia, unspecified: Secondary | ICD-10-CM | POA: Diagnosis not present

## 2014-02-10 DIAGNOSIS — L89324 Pressure ulcer of left buttock, stage 4: Secondary | ICD-10-CM | POA: Diagnosis not present

## 2014-02-11 DIAGNOSIS — G8220 Paraplegia, unspecified: Secondary | ICD-10-CM | POA: Diagnosis not present

## 2014-02-11 DIAGNOSIS — L89324 Pressure ulcer of left buttock, stage 4: Secondary | ICD-10-CM | POA: Diagnosis not present

## 2014-02-11 DIAGNOSIS — E43 Unspecified severe protein-calorie malnutrition: Secondary | ICD-10-CM | POA: Diagnosis not present

## 2014-02-11 DIAGNOSIS — L89154 Pressure ulcer of sacral region, stage 4: Secondary | ICD-10-CM | POA: Diagnosis not present

## 2014-02-11 DIAGNOSIS — L89312 Pressure ulcer of right buttock, stage 2: Secondary | ICD-10-CM | POA: Diagnosis not present

## 2014-02-11 DIAGNOSIS — L89892 Pressure ulcer of other site, stage 2: Secondary | ICD-10-CM | POA: Diagnosis not present

## 2014-02-15 DIAGNOSIS — G8220 Paraplegia, unspecified: Secondary | ICD-10-CM | POA: Diagnosis not present

## 2014-02-15 DIAGNOSIS — E43 Unspecified severe protein-calorie malnutrition: Secondary | ICD-10-CM | POA: Diagnosis not present

## 2014-02-15 DIAGNOSIS — L89324 Pressure ulcer of left buttock, stage 4: Secondary | ICD-10-CM | POA: Diagnosis not present

## 2014-02-15 DIAGNOSIS — L89312 Pressure ulcer of right buttock, stage 2: Secondary | ICD-10-CM | POA: Diagnosis not present

## 2014-02-15 DIAGNOSIS — L89154 Pressure ulcer of sacral region, stage 4: Secondary | ICD-10-CM | POA: Diagnosis not present

## 2014-02-15 DIAGNOSIS — L89892 Pressure ulcer of other site, stage 2: Secondary | ICD-10-CM | POA: Diagnosis not present

## 2014-02-18 DIAGNOSIS — L89154 Pressure ulcer of sacral region, stage 4: Secondary | ICD-10-CM | POA: Diagnosis not present

## 2014-02-18 DIAGNOSIS — L89324 Pressure ulcer of left buttock, stage 4: Secondary | ICD-10-CM | POA: Diagnosis not present

## 2014-02-18 DIAGNOSIS — L89892 Pressure ulcer of other site, stage 2: Secondary | ICD-10-CM | POA: Diagnosis not present

## 2014-02-18 DIAGNOSIS — L89312 Pressure ulcer of right buttock, stage 2: Secondary | ICD-10-CM | POA: Diagnosis not present

## 2014-02-18 DIAGNOSIS — G8220 Paraplegia, unspecified: Secondary | ICD-10-CM | POA: Diagnosis not present

## 2014-02-18 DIAGNOSIS — E43 Unspecified severe protein-calorie malnutrition: Secondary | ICD-10-CM | POA: Diagnosis not present

## 2014-02-21 DIAGNOSIS — L89154 Pressure ulcer of sacral region, stage 4: Secondary | ICD-10-CM | POA: Diagnosis not present

## 2014-02-21 DIAGNOSIS — G8220 Paraplegia, unspecified: Secondary | ICD-10-CM | POA: Diagnosis not present

## 2014-02-21 DIAGNOSIS — L89324 Pressure ulcer of left buttock, stage 4: Secondary | ICD-10-CM | POA: Diagnosis not present

## 2014-02-21 DIAGNOSIS — L89892 Pressure ulcer of other site, stage 2: Secondary | ICD-10-CM | POA: Diagnosis not present

## 2014-02-21 DIAGNOSIS — E43 Unspecified severe protein-calorie malnutrition: Secondary | ICD-10-CM | POA: Diagnosis not present

## 2014-02-21 DIAGNOSIS — L89312 Pressure ulcer of right buttock, stage 2: Secondary | ICD-10-CM | POA: Diagnosis not present

## 2014-02-23 DIAGNOSIS — L89892 Pressure ulcer of other site, stage 2: Secondary | ICD-10-CM | POA: Diagnosis not present

## 2014-02-23 DIAGNOSIS — G8220 Paraplegia, unspecified: Secondary | ICD-10-CM | POA: Diagnosis not present

## 2014-02-23 DIAGNOSIS — L89154 Pressure ulcer of sacral region, stage 4: Secondary | ICD-10-CM | POA: Diagnosis not present

## 2014-02-23 DIAGNOSIS — L89312 Pressure ulcer of right buttock, stage 2: Secondary | ICD-10-CM | POA: Diagnosis not present

## 2014-02-23 DIAGNOSIS — L89324 Pressure ulcer of left buttock, stage 4: Secondary | ICD-10-CM | POA: Diagnosis not present

## 2014-02-23 DIAGNOSIS — E43 Unspecified severe protein-calorie malnutrition: Secondary | ICD-10-CM | POA: Diagnosis not present

## 2014-02-26 DIAGNOSIS — E43 Unspecified severe protein-calorie malnutrition: Secondary | ICD-10-CM | POA: Diagnosis not present

## 2014-02-26 DIAGNOSIS — G8220 Paraplegia, unspecified: Secondary | ICD-10-CM | POA: Diagnosis not present

## 2014-02-26 DIAGNOSIS — L89154 Pressure ulcer of sacral region, stage 4: Secondary | ICD-10-CM | POA: Diagnosis not present

## 2014-02-26 DIAGNOSIS — L89892 Pressure ulcer of other site, stage 2: Secondary | ICD-10-CM | POA: Diagnosis not present

## 2014-02-26 DIAGNOSIS — L89324 Pressure ulcer of left buttock, stage 4: Secondary | ICD-10-CM | POA: Diagnosis not present

## 2014-02-26 DIAGNOSIS — L89312 Pressure ulcer of right buttock, stage 2: Secondary | ICD-10-CM | POA: Diagnosis not present

## 2014-02-27 ENCOUNTER — Encounter: Payer: Self-pay | Admitting: Surgery

## 2014-02-27 DIAGNOSIS — L89324 Pressure ulcer of left buttock, stage 4: Secondary | ICD-10-CM | POA: Diagnosis not present

## 2014-03-03 DIAGNOSIS — L89324 Pressure ulcer of left buttock, stage 4: Secondary | ICD-10-CM | POA: Diagnosis not present

## 2014-03-03 DIAGNOSIS — L89312 Pressure ulcer of right buttock, stage 2: Secondary | ICD-10-CM | POA: Diagnosis not present

## 2014-03-03 DIAGNOSIS — G8220 Paraplegia, unspecified: Secondary | ICD-10-CM | POA: Diagnosis not present

## 2014-03-03 DIAGNOSIS — E43 Unspecified severe protein-calorie malnutrition: Secondary | ICD-10-CM | POA: Diagnosis not present

## 2014-03-03 DIAGNOSIS — L89154 Pressure ulcer of sacral region, stage 4: Secondary | ICD-10-CM | POA: Diagnosis not present

## 2014-03-03 DIAGNOSIS — L89892 Pressure ulcer of other site, stage 2: Secondary | ICD-10-CM | POA: Diagnosis not present

## 2014-03-05 DIAGNOSIS — E43 Unspecified severe protein-calorie malnutrition: Secondary | ICD-10-CM | POA: Diagnosis not present

## 2014-03-05 DIAGNOSIS — L89154 Pressure ulcer of sacral region, stage 4: Secondary | ICD-10-CM | POA: Diagnosis not present

## 2014-03-05 DIAGNOSIS — G8220 Paraplegia, unspecified: Secondary | ICD-10-CM | POA: Diagnosis not present

## 2014-03-05 DIAGNOSIS — L89324 Pressure ulcer of left buttock, stage 4: Secondary | ICD-10-CM | POA: Diagnosis not present

## 2014-03-05 DIAGNOSIS — L89892 Pressure ulcer of other site, stage 2: Secondary | ICD-10-CM | POA: Diagnosis not present

## 2014-03-05 DIAGNOSIS — L89312 Pressure ulcer of right buttock, stage 2: Secondary | ICD-10-CM | POA: Diagnosis not present

## 2014-03-07 DIAGNOSIS — L89324 Pressure ulcer of left buttock, stage 4: Secondary | ICD-10-CM | POA: Diagnosis not present

## 2014-03-07 DIAGNOSIS — G8220 Paraplegia, unspecified: Secondary | ICD-10-CM | POA: Diagnosis not present

## 2014-03-07 DIAGNOSIS — E43 Unspecified severe protein-calorie malnutrition: Secondary | ICD-10-CM | POA: Diagnosis not present

## 2014-03-07 DIAGNOSIS — L89892 Pressure ulcer of other site, stage 2: Secondary | ICD-10-CM | POA: Diagnosis not present

## 2014-03-07 DIAGNOSIS — L89154 Pressure ulcer of sacral region, stage 4: Secondary | ICD-10-CM | POA: Diagnosis not present

## 2014-03-07 DIAGNOSIS — L89312 Pressure ulcer of right buttock, stage 2: Secondary | ICD-10-CM | POA: Diagnosis not present

## 2014-03-09 DIAGNOSIS — E43 Unspecified severe protein-calorie malnutrition: Secondary | ICD-10-CM | POA: Diagnosis not present

## 2014-03-09 DIAGNOSIS — L89324 Pressure ulcer of left buttock, stage 4: Secondary | ICD-10-CM | POA: Diagnosis not present

## 2014-03-09 DIAGNOSIS — L89892 Pressure ulcer of other site, stage 2: Secondary | ICD-10-CM | POA: Diagnosis not present

## 2014-03-09 DIAGNOSIS — G8220 Paraplegia, unspecified: Secondary | ICD-10-CM | POA: Diagnosis not present

## 2014-03-09 DIAGNOSIS — L89312 Pressure ulcer of right buttock, stage 2: Secondary | ICD-10-CM | POA: Diagnosis not present

## 2014-03-09 DIAGNOSIS — L89154 Pressure ulcer of sacral region, stage 4: Secondary | ICD-10-CM | POA: Diagnosis not present

## 2014-03-11 DIAGNOSIS — L89312 Pressure ulcer of right buttock, stage 2: Secondary | ICD-10-CM | POA: Diagnosis not present

## 2014-03-11 DIAGNOSIS — G8220 Paraplegia, unspecified: Secondary | ICD-10-CM | POA: Diagnosis not present

## 2014-03-11 DIAGNOSIS — L89324 Pressure ulcer of left buttock, stage 4: Secondary | ICD-10-CM | POA: Diagnosis not present

## 2014-03-11 DIAGNOSIS — E43 Unspecified severe protein-calorie malnutrition: Secondary | ICD-10-CM | POA: Diagnosis not present

## 2014-03-11 DIAGNOSIS — L89154 Pressure ulcer of sacral region, stage 4: Secondary | ICD-10-CM | POA: Diagnosis not present

## 2014-03-11 DIAGNOSIS — L89892 Pressure ulcer of other site, stage 2: Secondary | ICD-10-CM | POA: Diagnosis not present

## 2014-03-14 ENCOUNTER — Ambulatory Visit: Payer: Self-pay | Admitting: Surgery

## 2014-03-14 DIAGNOSIS — L89324 Pressure ulcer of left buttock, stage 4: Secondary | ICD-10-CM | POA: Diagnosis not present

## 2014-03-14 DIAGNOSIS — Z89421 Acquired absence of other right toe(s): Secondary | ICD-10-CM | POA: Diagnosis not present

## 2014-03-14 DIAGNOSIS — L97519 Non-pressure chronic ulcer of other part of right foot with unspecified severity: Secondary | ICD-10-CM | POA: Diagnosis not present

## 2014-03-14 DIAGNOSIS — L89159 Pressure ulcer of sacral region, unspecified stage: Secondary | ICD-10-CM | POA: Diagnosis not present

## 2014-03-14 DIAGNOSIS — M6088 Other myositis, other site: Secondary | ICD-10-CM | POA: Diagnosis not present

## 2014-03-17 DIAGNOSIS — E43 Unspecified severe protein-calorie malnutrition: Secondary | ICD-10-CM | POA: Diagnosis not present

## 2014-03-17 DIAGNOSIS — L89892 Pressure ulcer of other site, stage 2: Secondary | ICD-10-CM | POA: Diagnosis not present

## 2014-03-17 DIAGNOSIS — G8220 Paraplegia, unspecified: Secondary | ICD-10-CM | POA: Diagnosis not present

## 2014-03-17 DIAGNOSIS — L89154 Pressure ulcer of sacral region, stage 4: Secondary | ICD-10-CM | POA: Diagnosis not present

## 2014-03-17 DIAGNOSIS — L89312 Pressure ulcer of right buttock, stage 2: Secondary | ICD-10-CM | POA: Diagnosis not present

## 2014-03-17 DIAGNOSIS — L89324 Pressure ulcer of left buttock, stage 4: Secondary | ICD-10-CM | POA: Diagnosis not present

## 2014-03-28 DIAGNOSIS — L89324 Pressure ulcer of left buttock, stage 4: Secondary | ICD-10-CM | POA: Diagnosis not present

## 2014-03-29 ENCOUNTER — Encounter: Payer: Self-pay | Admitting: Surgery

## 2014-03-29 DIAGNOSIS — G8220 Paraplegia, unspecified: Secondary | ICD-10-CM | POA: Diagnosis not present

## 2014-03-29 DIAGNOSIS — L89324 Pressure ulcer of left buttock, stage 4: Secondary | ICD-10-CM | POA: Diagnosis not present

## 2014-03-30 DIAGNOSIS — G8220 Paraplegia, unspecified: Secondary | ICD-10-CM | POA: Diagnosis not present

## 2014-03-30 DIAGNOSIS — S91111D Laceration without foreign body of right great toe without damage to nail, subsequent encounter: Secondary | ICD-10-CM | POA: Diagnosis not present

## 2014-03-30 DIAGNOSIS — S91114D Laceration without foreign body of right lesser toe(s) without damage to nail, subsequent encounter: Secondary | ICD-10-CM | POA: Diagnosis not present

## 2014-03-30 DIAGNOSIS — L89154 Pressure ulcer of sacral region, stage 4: Secondary | ICD-10-CM | POA: Diagnosis not present

## 2014-03-30 DIAGNOSIS — L89324 Pressure ulcer of left buttock, stage 4: Secondary | ICD-10-CM | POA: Diagnosis not present

## 2014-03-30 DIAGNOSIS — M86152 Other acute osteomyelitis, left femur: Secondary | ICD-10-CM | POA: Diagnosis not present

## 2014-04-01 DIAGNOSIS — G8220 Paraplegia, unspecified: Secondary | ICD-10-CM | POA: Diagnosis not present

## 2014-04-01 DIAGNOSIS — L89324 Pressure ulcer of left buttock, stage 4: Secondary | ICD-10-CM | POA: Diagnosis not present

## 2014-04-01 DIAGNOSIS — S91111D Laceration without foreign body of right great toe without damage to nail, subsequent encounter: Secondary | ICD-10-CM | POA: Diagnosis not present

## 2014-04-01 DIAGNOSIS — L89154 Pressure ulcer of sacral region, stage 4: Secondary | ICD-10-CM | POA: Diagnosis not present

## 2014-04-01 DIAGNOSIS — M86152 Other acute osteomyelitis, left femur: Secondary | ICD-10-CM | POA: Diagnosis not present

## 2014-04-01 DIAGNOSIS — S91114D Laceration without foreign body of right lesser toe(s) without damage to nail, subsequent encounter: Secondary | ICD-10-CM | POA: Diagnosis not present

## 2014-04-06 DIAGNOSIS — G8220 Paraplegia, unspecified: Secondary | ICD-10-CM | POA: Diagnosis not present

## 2014-04-06 DIAGNOSIS — S91114D Laceration without foreign body of right lesser toe(s) without damage to nail, subsequent encounter: Secondary | ICD-10-CM | POA: Diagnosis not present

## 2014-04-06 DIAGNOSIS — L89324 Pressure ulcer of left buttock, stage 4: Secondary | ICD-10-CM | POA: Diagnosis not present

## 2014-04-06 DIAGNOSIS — S91111D Laceration without foreign body of right great toe without damage to nail, subsequent encounter: Secondary | ICD-10-CM | POA: Diagnosis not present

## 2014-04-06 DIAGNOSIS — L89154 Pressure ulcer of sacral region, stage 4: Secondary | ICD-10-CM | POA: Diagnosis not present

## 2014-04-06 DIAGNOSIS — M86152 Other acute osteomyelitis, left femur: Secondary | ICD-10-CM | POA: Diagnosis not present

## 2014-04-08 DIAGNOSIS — S91114D Laceration without foreign body of right lesser toe(s) without damage to nail, subsequent encounter: Secondary | ICD-10-CM | POA: Diagnosis not present

## 2014-04-08 DIAGNOSIS — L89154 Pressure ulcer of sacral region, stage 4: Secondary | ICD-10-CM | POA: Diagnosis not present

## 2014-04-08 DIAGNOSIS — L89324 Pressure ulcer of left buttock, stage 4: Secondary | ICD-10-CM | POA: Diagnosis not present

## 2014-04-08 DIAGNOSIS — M86152 Other acute osteomyelitis, left femur: Secondary | ICD-10-CM | POA: Diagnosis not present

## 2014-04-08 DIAGNOSIS — G8220 Paraplegia, unspecified: Secondary | ICD-10-CM | POA: Diagnosis not present

## 2014-04-08 DIAGNOSIS — S91111D Laceration without foreign body of right great toe without damage to nail, subsequent encounter: Secondary | ICD-10-CM | POA: Diagnosis not present

## 2014-04-11 DIAGNOSIS — G8220 Paraplegia, unspecified: Secondary | ICD-10-CM | POA: Diagnosis not present

## 2014-04-11 DIAGNOSIS — L89324 Pressure ulcer of left buttock, stage 4: Secondary | ICD-10-CM | POA: Diagnosis not present

## 2014-04-13 DIAGNOSIS — S91114D Laceration without foreign body of right lesser toe(s) without damage to nail, subsequent encounter: Secondary | ICD-10-CM | POA: Diagnosis not present

## 2014-04-13 DIAGNOSIS — L89324 Pressure ulcer of left buttock, stage 4: Secondary | ICD-10-CM | POA: Diagnosis not present

## 2014-04-13 DIAGNOSIS — S91111D Laceration without foreign body of right great toe without damage to nail, subsequent encounter: Secondary | ICD-10-CM | POA: Diagnosis not present

## 2014-04-13 DIAGNOSIS — G8220 Paraplegia, unspecified: Secondary | ICD-10-CM | POA: Diagnosis not present

## 2014-04-13 DIAGNOSIS — M86152 Other acute osteomyelitis, left femur: Secondary | ICD-10-CM | POA: Diagnosis not present

## 2014-04-13 DIAGNOSIS — L89154 Pressure ulcer of sacral region, stage 4: Secondary | ICD-10-CM | POA: Diagnosis not present

## 2014-04-15 DIAGNOSIS — S91114D Laceration without foreign body of right lesser toe(s) without damage to nail, subsequent encounter: Secondary | ICD-10-CM | POA: Diagnosis not present

## 2014-04-15 DIAGNOSIS — L89154 Pressure ulcer of sacral region, stage 4: Secondary | ICD-10-CM | POA: Diagnosis not present

## 2014-04-15 DIAGNOSIS — G8220 Paraplegia, unspecified: Secondary | ICD-10-CM | POA: Diagnosis not present

## 2014-04-15 DIAGNOSIS — S91111D Laceration without foreign body of right great toe without damage to nail, subsequent encounter: Secondary | ICD-10-CM | POA: Diagnosis not present

## 2014-04-15 DIAGNOSIS — L89324 Pressure ulcer of left buttock, stage 4: Secondary | ICD-10-CM | POA: Diagnosis not present

## 2014-04-15 DIAGNOSIS — M86152 Other acute osteomyelitis, left femur: Secondary | ICD-10-CM | POA: Diagnosis not present

## 2014-04-20 DIAGNOSIS — L89154 Pressure ulcer of sacral region, stage 4: Secondary | ICD-10-CM | POA: Diagnosis not present

## 2014-04-20 DIAGNOSIS — M86152 Other acute osteomyelitis, left femur: Secondary | ICD-10-CM | POA: Diagnosis not present

## 2014-04-20 DIAGNOSIS — S91114D Laceration without foreign body of right lesser toe(s) without damage to nail, subsequent encounter: Secondary | ICD-10-CM | POA: Diagnosis not present

## 2014-04-20 DIAGNOSIS — L89324 Pressure ulcer of left buttock, stage 4: Secondary | ICD-10-CM | POA: Diagnosis not present

## 2014-04-20 DIAGNOSIS — S91111D Laceration without foreign body of right great toe without damage to nail, subsequent encounter: Secondary | ICD-10-CM | POA: Diagnosis not present

## 2014-04-20 DIAGNOSIS — G8220 Paraplegia, unspecified: Secondary | ICD-10-CM | POA: Diagnosis not present

## 2014-04-27 DIAGNOSIS — L89324 Pressure ulcer of left buttock, stage 4: Secondary | ICD-10-CM | POA: Diagnosis not present

## 2014-04-27 DIAGNOSIS — S91111D Laceration without foreign body of right great toe without damage to nail, subsequent encounter: Secondary | ICD-10-CM | POA: Diagnosis not present

## 2014-04-27 DIAGNOSIS — G8220 Paraplegia, unspecified: Secondary | ICD-10-CM | POA: Diagnosis not present

## 2014-04-27 DIAGNOSIS — S91114D Laceration without foreign body of right lesser toe(s) without damage to nail, subsequent encounter: Secondary | ICD-10-CM | POA: Diagnosis not present

## 2014-04-27 DIAGNOSIS — L89154 Pressure ulcer of sacral region, stage 4: Secondary | ICD-10-CM | POA: Diagnosis not present

## 2014-04-27 DIAGNOSIS — M86152 Other acute osteomyelitis, left femur: Secondary | ICD-10-CM | POA: Diagnosis not present

## 2014-04-29 ENCOUNTER — Encounter: Payer: Self-pay | Admitting: Surgery

## 2014-05-04 DIAGNOSIS — M86152 Other acute osteomyelitis, left femur: Secondary | ICD-10-CM | POA: Diagnosis not present

## 2014-05-04 DIAGNOSIS — L89324 Pressure ulcer of left buttock, stage 4: Secondary | ICD-10-CM | POA: Diagnosis not present

## 2014-05-04 DIAGNOSIS — G8220 Paraplegia, unspecified: Secondary | ICD-10-CM | POA: Diagnosis not present

## 2014-05-04 DIAGNOSIS — L89154 Pressure ulcer of sacral region, stage 4: Secondary | ICD-10-CM | POA: Diagnosis not present

## 2014-05-04 DIAGNOSIS — S91111D Laceration without foreign body of right great toe without damage to nail, subsequent encounter: Secondary | ICD-10-CM | POA: Diagnosis not present

## 2014-05-04 DIAGNOSIS — S91114D Laceration without foreign body of right lesser toe(s) without damage to nail, subsequent encounter: Secondary | ICD-10-CM | POA: Diagnosis not present

## 2014-05-06 DIAGNOSIS — M86152 Other acute osteomyelitis, left femur: Secondary | ICD-10-CM | POA: Diagnosis not present

## 2014-05-06 DIAGNOSIS — S91111D Laceration without foreign body of right great toe without damage to nail, subsequent encounter: Secondary | ICD-10-CM | POA: Diagnosis not present

## 2014-05-06 DIAGNOSIS — S91114D Laceration without foreign body of right lesser toe(s) without damage to nail, subsequent encounter: Secondary | ICD-10-CM | POA: Diagnosis not present

## 2014-05-06 DIAGNOSIS — G8220 Paraplegia, unspecified: Secondary | ICD-10-CM | POA: Diagnosis not present

## 2014-05-06 DIAGNOSIS — L89324 Pressure ulcer of left buttock, stage 4: Secondary | ICD-10-CM | POA: Diagnosis not present

## 2014-05-06 DIAGNOSIS — L89154 Pressure ulcer of sacral region, stage 4: Secondary | ICD-10-CM | POA: Diagnosis not present

## 2014-05-11 DIAGNOSIS — G8220 Paraplegia, unspecified: Secondary | ICD-10-CM | POA: Diagnosis not present

## 2014-05-11 DIAGNOSIS — M86152 Other acute osteomyelitis, left femur: Secondary | ICD-10-CM | POA: Diagnosis not present

## 2014-05-11 DIAGNOSIS — L89324 Pressure ulcer of left buttock, stage 4: Secondary | ICD-10-CM | POA: Diagnosis not present

## 2014-05-11 DIAGNOSIS — S91114D Laceration without foreign body of right lesser toe(s) without damage to nail, subsequent encounter: Secondary | ICD-10-CM | POA: Diagnosis not present

## 2014-05-11 DIAGNOSIS — S91111D Laceration without foreign body of right great toe without damage to nail, subsequent encounter: Secondary | ICD-10-CM | POA: Diagnosis not present

## 2014-05-11 DIAGNOSIS — L89154 Pressure ulcer of sacral region, stage 4: Secondary | ICD-10-CM | POA: Diagnosis not present

## 2014-05-16 DIAGNOSIS — S91114D Laceration without foreign body of right lesser toe(s) without damage to nail, subsequent encounter: Secondary | ICD-10-CM | POA: Diagnosis not present

## 2014-05-16 DIAGNOSIS — G8220 Paraplegia, unspecified: Secondary | ICD-10-CM | POA: Diagnosis not present

## 2014-05-16 DIAGNOSIS — S91111D Laceration without foreign body of right great toe without damage to nail, subsequent encounter: Secondary | ICD-10-CM | POA: Diagnosis not present

## 2014-05-16 DIAGNOSIS — M86152 Other acute osteomyelitis, left femur: Secondary | ICD-10-CM | POA: Diagnosis not present

## 2014-05-16 DIAGNOSIS — L89154 Pressure ulcer of sacral region, stage 4: Secondary | ICD-10-CM | POA: Diagnosis not present

## 2014-05-16 DIAGNOSIS — L89324 Pressure ulcer of left buttock, stage 4: Secondary | ICD-10-CM | POA: Diagnosis not present

## 2014-05-18 DIAGNOSIS — L89154 Pressure ulcer of sacral region, stage 4: Secondary | ICD-10-CM | POA: Diagnosis not present

## 2014-05-18 DIAGNOSIS — S91111D Laceration without foreign body of right great toe without damage to nail, subsequent encounter: Secondary | ICD-10-CM | POA: Diagnosis not present

## 2014-05-18 DIAGNOSIS — G8220 Paraplegia, unspecified: Secondary | ICD-10-CM | POA: Diagnosis not present

## 2014-05-18 DIAGNOSIS — S91114D Laceration without foreign body of right lesser toe(s) without damage to nail, subsequent encounter: Secondary | ICD-10-CM | POA: Diagnosis not present

## 2014-05-18 DIAGNOSIS — L89324 Pressure ulcer of left buttock, stage 4: Secondary | ICD-10-CM | POA: Diagnosis not present

## 2014-05-18 DIAGNOSIS — M86152 Other acute osteomyelitis, left femur: Secondary | ICD-10-CM | POA: Diagnosis not present

## 2014-08-19 NOTE — Consult Note (Signed)
Brief Consult Note: Diagnosis: major depression.   Patient was seen by consultant.   Consult note dictated.   Recommend further assessment or treatment.   Orders entered.   Discussed with Attending MD.   Comments: Psychiatry: Patient seen. Chart reviewed. Patient has not been taking care of his health and his excuses are not very good. He is sleeping too uch and eating poorly and his mood is bad. He denies any suicidal ideation, though, and is not psychotic. He is open to and agreeable to treatment both medically and psychiatrically. Does not meet commitment criteria. Will dc the commitment but suggest patient be started on wellbutrin sr 150mg  in the am and 150mg  midday. That was the only antidepressant he would agree to. Does not need inpatient BH but we will follow if he is in the hospital and if not he should be referred for outpt mental health treatment.  Electronic Signatures: Audery Amellapacs, Emanual Lamountain T (MD)  (Signed 12-Feb-14 16:31)  Authored: Brief Consult Note   Last Updated: 12-Feb-14 16:31 by Audery Amellapacs, Nathanal Hermiz T (MD)

## 2014-08-19 NOTE — Consult Note (Signed)
PATIENT NAME:  Jonathon York, Jonathon York MR#:  [id number] DATE OF BIRTH:  1975/10/01  DATE OF CONSULTATION:  06/10/2012  REFERRING PHYSICIAN:   CONSULTING PHYSICIAN:  Audery Amel, MD  IDENTIFYING INFORMATION AND REASON FOR CONSULT:  This is a 39 year old man who was brought to the Emergency Room under involuntary commitment filed by his daughter on the grounds that he is not taking care of his own health. Consultation for evaluation of psychiatric needs and need for commitment.   HISTORY OF PRESENT ILLNESS:  Information obtained from the patient and from the chart. The patient's daughter took out a commitment petition stating that he has not been taking care of himself. Adult Protective Services came by to visit the patient and he became agitated with them. They found him hostile. The patient says that he was just angry that someone was intruding on his life. The patient says that he has no suicidal ideation whatsoever. He admits that his mood feels depressed and he blames this on his chronic medical problems. He admits that he has not been taking care of his health very well. He has sacral ulcers from a fall and is not going for appropriate followup with them, has not had surgery as was recommended and no longer gets home health care. He is not taking any medication and not going to see a doctor. He admits that he has been eating poorly and losing weight. He sleeps excessively. He denies any suicidal or homicidal ideation. Denies any psychotic symptoms. He says that he would like to get help and feel better, but that he feels like it is impossible to do so. He denies that he has been drinking alcohol, but admits that he uses marijuana on a regular basis probably a couple of times a week.   PAST PSYCHIATRIC HISTORY:  No previous psychiatric treatment here at the hospital identified. The patient says that he saw a psychiatrist and psychologist after he first became paralyzed. He denies that he ever took an  antidepressant, although he said that he was told he was depressed in the past. He admitted to me that he has made suicide attempts in the past within the last few years having taken overdoses of Ambien previously. Denies any history of psychotic symptoms.   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 He has sacral decubiti that have been present for a couple of years. He was supposed to get followup treatment with a home health nurse, but he says that they stopped coming and that he was for some reason never able to get appropriate surgery. He blames it on "Obama Care" which is obviously ridiculous since it has been going on for a couple of years. The patient also says that he was told that he needed surgery, but that the surgeon refused to help him if he continued smoking which also sounds somewhat unbelievable. He denies ever having been treated for substance abuse in the past. Denies history of DTs or opiate dependence.   SOCIAL HISTORY:  The patient is not married. He has 2 teenage children who previously had been staying with him, but he sent them away and told them to go live with their mother. This has left him with no one at home which has made his life much more difficult as he admits. He says that he resented his children being around and that they felt like he needed to take care of them. He does  get Social Security disability and has Medicare. It sounds like his social life is pretty limited.   REVIEW OF SYSTEMS:  He does admit that he feels down and negative. Sleeps excessively sometimes 16 hours a day. Does not eat very well and has been losing weight. Has not been motivated to get any treatment for himself. Feels like it is just not possible that he can do anything to help himself get better. Denies any suicidal ideation. Denies any suicidal plan or intent. Denies any psychotic symptoms.   MENTAL STATUS EXAM:  A thin, somewhat ill-looking  man who looks older than his stated age interviewed in the Emergency Room. He was cooperative with the interview. Eye contact was good. Psychomotor activity was normal given his disability. Speech was normal in rate, tone and volume. Affect was euthymic, reactive and generally appropriate. Mood was stated as being okay. Thoughts appeared to be logical grossly without any bizarre or disorganized thinking. Denied hallucinations. He denied any suicidal or homicidal ideation. His judgment and insight are somewhat impaired, although not utterly. He has baseline normal intelligence. Short and long-term memory is grossly intact.   LABORATORY RESULTS:  Chemistry panel shows creatinine low at 0.55, chloride elevated at 111, AST low at 13, total protein 6.7, but albumin low at 2.8. Alcohol is undetectable. Multiple abnormalities on the blood count including anemia with a hematocrit of 34.2 and white count slightly elevated at 11.1. Salicylates slightly elevated at 3. TSH is normal. Drug screen is positive for marijuana. Urinalysis appears grossly infected.   ASSESSMENT:  A 39 year old man who endorses multiple symptoms consistent with major depression. His behavior recently has not been psychotic and not been suicidal, but he has not been taking good care of himself. Currently, he says that he is willing to get appropriate treatment. I do not think he meet commitment criteria at this time, but I do think that he needs mental health treatment along with all of his medical treatment.   TREATMENT PLAN:  I have discussed all this with the patient and he ultimately agreed with me. I recommended to him that we go ahead and start an antidepressant now. He was initially reluctant for reasons that were illogical, but ultimately said that he would agree to it if it would be Wellbutrin because that might help him to stop smoking. Side effects were discussed and I agreed that we could go ahead and do this. I would suggest starting  Wellbutrin SR 150 mg once in the morning and once at midday. I have communicated this to Dr. Mindi Junker in the Emergency Room. I have taken the patient off of involuntary commitment and I do not think he needs inpatient psychiatric care. I will be happy to follow him up if he gets admitted to the hospital. If he does not get admitted to the hospital, I think he ought to be referred for outpatient psychiatric care in the community along with outpatient medical care. All of this was communicated to the patient.   DIAGNOSIS, PRINCIPAL AND PRIMARY:  AXIS I: Major depression, recurrent, moderate.   SECONDARY DIAGNOSES: AXIS I: Marijuana abuse.  AXIS II: Deferred.  AXIS III: Paraplegic, sacral ulcers, urinary tract infection and underweight.  AXIS IV: Severe from multiple disabilities and not taking care of himself.  AXIS V: Functioning at time of evaluation 40.    ____________________________ Audery Amel, MD jtc:si D: 06/10/2012 17:07:08 ET T: 06/10/2012 20:26:45 ET JOB#: [id number]  cc: Audery Amel, MD, <Dictator> Jackquline Denmark  Shakti Fleer MD ELECTRONICALLY SIGNED 06/12/2012 23:11

## 2014-08-19 NOTE — Consult Note (Signed)
PATIENT NAME:  Jonathon York, Jonathon York MR#:  [id number] DATE OF BIRTH:  [birth year]  DATE OF CONSULTATION:  06/10/2012  REFERRING PHYSICIAN:   CONSULTING PHYSICIAN:  Cristal Deerhristopher A. Darion Milewski, MD  REASON FOR CONSULTATION:  Evaluation of sacral and ischial tuberosity decubitus ulcers.   HISTORY OF PRESENT ILLNESS:  The patient is a pleasant 39 year old male with paraplegia for approximately 12 years. He had a high fall and spinal cord injury. I am being asked to look at his decubitus ulcers. He has had decubitus ulcers for approximately 3 years. We had previously seen him in the hospital for his ischial tuberosity ulcer which was debrided and had a wound VAC placed. He has had multiple debridements in the past. He presents today with 2 ulcers, 1 over his ischial tuberosity and 1 over his sacrum. He has taken care of both of them adequately at home. He is using vinegar and saline gauze that he is packing them with. He says that if he does not use the vinegar then it smells after a few days. Otherwise, no drainage, no fevers, chills, night sweats, shortness of breath, cough, chest pain, abdominal pain, nausea, vomiting, diarrhea, constipation. He does a straight cath himself. No dysuria or hematuria.   PAST MEDICAL HISTORY:   1.  Paraplegia.  2.  History of neurogenic bladder.   HOME MEDICATIONS:  Ditropan XL 15 mg every 24 hours and is also being started on Wellbutrin for depression as well as to assist with him quitting smoking.   ALLERGIES:  CODEINE WHICH CAUSES GI DISTRESS.   SOCIAL HISTORY:  Uses tobacco approximately 5 cigarettes per day. Denies alcohol or other drug use.   FAMILY HISTORY:  Denies any diabetes or coronary artery disease.   REVIEW OF SYSTEMS:  A 12-point review of systems was obtained. Pertinent positives and negatives as above.   PHYSICAL EXAMINATION: VITAL SIGNS: Temperature 98.1, pulse 98, blood pressure 94/53, respirations 18, 99% on room air.  GENERAL: No acute distress,  alert and oriented x 3 and pleasant.  HEAD: Normocephalic, atraumatic.  EYES: No scleral icterus. No conjunctivitis.  FACE: No obvious facial trauma. Normal external nose. Normal external ears.  CHEST: Lungs are clear to auscultation, moving air well.  HEART: Regular rate and rhythm. No murmurs, rubs or gallops.  ABDOMEN: Soft, nontender, nondistended.  BACK: Has multiple incisions, one over his left hip and one on his midline spine.  SACRUM AND HIP: Has approximately a 7 x 7 cm sacral and 7 x 7 left ischial tuberosity wound. He is not to have pressure on it if possible. The wound bases are clean with no exposed bone. No obvious necrotic tissue. Hip wound is tracking inferiorly, but again no obvious necrotic tissue.   DIAGNOSTIC DATA:  Significant for white cell count 11.1, hemoglobin 10.5, hematocrit 34.2, platelets 383. Urinalysis is positive for leukocyte esterase, nitrites, not surprising, positive for cannabinoids. Labs are otherwise unremarkable. Imaging: No imaging was performed.   ASSESSMENT AND PLAN:  The patient is a pleasant 39 year old male with chronic ulcers which he is well taking care of. I would recommend decreasing vinegar and that he would likely benefit from outpatient wound VAC therapy. He will follow up with me at the office and we will attempt to set him up for a wound VAC as an outpatient. He may benefit from skin grafting and his wound is actually well contained. It would probably be able to support a wound graft; however, he smokes cigarettes and I will not perform any  procedure on him if he continues to smoke. He may also benefit from plastics consultation for tissue rearrangement again if he is able to keep pressure off his decubitus ulcers. No need for emergent surgical intervention. No need for surgical admission.    ____________________________ Si Raider Carle Dargan, MD cal:si D: 06/10/2012 21:18:12 ET T: 06/10/2012 22:16:08 ET JOB#: [id number]  cc: Cristal Deer A.  Naliya Gish, MD, <Dictator> Jarvis Newcomer MD ELECTRONICALLY SIGNED 06/17/2012 14:03

## 2014-08-20 NOTE — Consult Note (Signed)
Details:   - Pt seen. Full note dictated. Pt with ulcer right 5th toe with exposed PIPJ and imaging findings consistent with osteomyelitis right 5th toe. Clinically not draining purulence though pt state is markedly improved since admission.  Noted surrounding erythema and edema to 5th toe.  Would recommend amputation of 5th toe unless pt request to undergo continued local wound care and IV abx. Will d/w pt and can perform surgery on Tuesday if pt desires.   Electronic Signatures: Gwyneth RevelsFowler, Akira Adelsberger (MD)  (Signed 30-Aug-15 18:53)  Authored: Details   Last Updated: 30-Aug-15 18:53 by Gwyneth RevelsFowler, Mahalia Dykes (MD)

## 2014-08-20 NOTE — H&P (Signed)
PATIENT NAME:  Jonathon York, DEUTSCHER MR#:  [id number] DATE OF BIRTH:  1975-07-30  DATE OF ADMISSION:  12/24/2013   REFERRING PHYSICIAN: [place]. Dolores Frame, MD  PRIMARY CARE PHYSICIAN: Non local.   CHIEF COMPLAINT: Fever.   HISTORY OF PRESENT ILLNESS: This is a 39 year old male with a known history of paraplegia secondary to traumatic fall, neurogenic bladder, decubitus ulcer. The patient presents with fever at home of 101.8. He reported he has been having fever and chills for the last 2 days. He has a poor appetite and decreased oral intake as well. The patient reports over the last 2 weeks he has been following with the outpatient wound care center. He has been having increased discharge from his sacral wound, and he was placed on a Wound VAC for the last 2 weeks, but reports for about a day and a half he has taken it off as he was having increased discharged from the wound.  The patient, as well, was noted to have a history of a neurogenic bladder where he self catheterizes himself. Upon presentation, the patient was afebrile. He was tachycardic initially and hypotensive with systolic blood pressure 90/66. The patient's urinalysis was positive for UTI. He did not have any significant leukocytosis. As well, the patient has been reporting he has been having right foot fifth toe ulceration and erythema over the last couple of days. He reports his wound care physician was concerned about it, and was planning to do MRI for him. He had an x-ray done of that foot, which did show bone demineralization. and suspicion for osteomyelitis. The patient was started on broad-spectrum IV antibiotics in the ED, and the hospitalists were requested to admit the patient. He denies any cough, any productive sputum, any dysuria, any polyuria, any chest pain, any shortness of breath.   PAST MEDICAL HISTORY:  1.  Neurogenic bladder, self catheterizes.  2.  History of  paraplegia secondary to traumatic fall.  3.  Decubitus ulcer.    ALLERGIES: CODEINE.   HOME MEDICATIONS: Ditropan Extended Release 15 mg 2 times a day.    SOCIAL HISTORY: The patient smokes 1 to 2 cigarettes per day; he reports he is trying to quit and has been taking a nicotine patch at home. He reports he is planning to quit at discharge.  No alcohol abuse. Denies any IV drug use.   FAMILY HISTORY: Denies any family history for coronary artery disease or diabetes.   REVIEW OF SYSTEMS:  CONSTITUTIONAL: Reports fever, chills, fatigue, weakness, poor appetite.  EYES: Denies blurry vision, double vision, inflammation.  EARS, NOSE, AND THROAT: Denies tinnitus, ear pain, hearing loss, epistaxis.  RESPIRATORY: Denies cough, shortness of breath, wheezing, hemoptysis.  CARDIOVASCULAR: Denies chest pain, orthopnea, palpitations, syncope.  GASTROINTESTINAL: Denies nausea, vomiting, diarrhea, abdominal pain, hematemesis.  GENITOURINARY: Denies dysuria, hematuria. Neurogenic bladder; he self catheterizes. ENDOCRINE: Denies polyuria, polydipsia, heat or cold intolerance.  HEMATOLOGY: Denies anemia, easy bruising, bleeding diatheses. INTEGUMENTARY: Denies any new skin rashes or lesions. Reports known sacral ulcer, currently following with the wound care clinic, and reports right foot and fifth toe erythema.  MUSCULOSKELETAL: No arthritis, no swelling, no cramps, no gout. The patient is paraplegic.  NEUROLOGIC: No new focal deficits. No tingling. No numbness. No ataxia. No seizure-type activity. He has paraplegia.  PSYCHIATRIC: Denies anxiety, insomnia, or depression. No suicidal ideation or thoughts.   PHYSICAL EXAMINATION:  VITAL SIGNS:  Temperature 98.4, pulse 101, respiratory rate 22, blood pressure 99/62, saturating 98% on room air. Upon presentation,  pulse of 128, blood pressure 90/66.  GENERAL: Pleasant appearing male; lies comfortably in bed, in no apparent distress.  HEENT: Head atraumatic, normocephalic. Extraocular muscles intact. Pink conjunctivae.  Anicteric sclerae. Moist oral mucosa. No oral thrush. No pharyngeal erythema. No nasal discharge.  NECK: Supple. No thyromegaly. No JVD. No lymphadenopathy. Trachea is midline.  CHEST: Had good air entry bilaterally. No wheezing, rales, or rhonchi. No use of accessory muscle.  CARDIOVASCULAR: S1, S2 heard. No rubs, murmurs, gallops. Tachycardic, but regular.  ABDOMEN: Soft, flat, nontender, nondistended. Bowel sounds present. No hepatosplenomegaly.  EXTREMITIES: No edema. No clubbing. No cyanosis. Pedal pulses +2 bilaterally. On the right foot, he is having a kind of like hyperkeratosis and skin thickening of the great toe. On the fifth toe of the right foot, he is having erythema, redness, and tenderness.  PSYCHIATRIC: The patient is pleasant, awake, alert x 3. Intact judgment and insight.  NEUROLOGIC: The patient has paraplegia with contracted lower extremities. Upper extremities, no focal deficits; sensation is intact and symmetrical to light touch.  LYMPHATIC: No cervical or axillary lymphadenopathy.  SKIN: The patient has a sacral pressure ulcer, stage IV. Currently, dressing has been recently changed by ED staff, so I could not appreciate any discharge, but a green purulent foul smelling discharge was noticed by the ED staff prior to the wound be cleaned and changed. He has 2 wounds in the sacral area, stage IV, packed and bandage at this point.   PERTINENT LABORATORY DATA: Glucose 123, BUN 10, creatinine 0.65, sodium 138, potassium 3.3, chloride 108, CO2 of 28. Troponin less than 0.02. White blood cells 10.5, hemoglobin 10.4, hematocrit 33.1, platelets 364,000. Urinalysis positive for nitrites, +1 leukocyte esterase and 21 white blood cells.   ASSESSMENT AND PLAN:  1.  Sepsis. The patient is tachycardic, with fever at home of 101.8.  2.  Hypotensive. Blood pressure currently improved after fluid boluses.  3.  Source of infection at this point is due to urinary tract infection, but as well  other sources cannot be ruled out yet, as infected sacral wound, as he has been having increased secretions as well he recently started on a Wound VAC. As well, there is a possibility of osteomyelitis in the toes of his right foot as well, so we will start the patient on broad-spectrum IV antibiotics. We will consult wound care to evaluate the patient, to resume him back on his Wound VAC. A wound swab was sent by the ED staff. We will check MRI of the right foot to rule out osteomyelitis as well, and will follow his blood cultures. We will follow on the wound cultures and follow on the urine culture. We will keep him on broad-spectrum IV antibiotics.  4.  Neurogenic bladder status post Foley catheter insertion. When he is stable, it can be discontinued, when he can self catheterize again. We will continue him on Ditropan.  5.  Decubitus ulcer. We will consult wound care to continue the patient's wound care. We will address his nutritional status. We will start him on Ensure 3 times a day to promote wound healing.  6.  Hypokalemia. We will replace. We will monitor and repeat in 24 hours.  7.  Deep vein thrombosis prophylaxis. Subcutaneous heparin.   CODE STATUS: The patient reports he is a Full Code. He does not have a Living Will or [employer].   TOTAL TIME SPENT ON ADMISSION AND PATIENT CARE: 55 minutes.     ____________________________ Starleen Arms,  MD dse:MT D: 12/24/2013 06:21:45 ET T: 12/24/2013 07:08:43 ET JOB#: [id number]  cc: Starleen Armsawood S. Yamaris Cummings, MD, <Dictator> Teandra Harlan Teena IraniS Emmanuella Mirante MD ELECTRONICALLY SIGNED 12/25/2013 4:10

## 2014-08-20 NOTE — Consult Note (Signed)
PATIENT NAME:  Jonathon York, [name] MR#:  [id number] DATE OF BIRTH:  [birth year]  DATE OF CONSULTATION:  12/26/2013  REFERRING PHYSICIAN:   CONSULTING PHYSICIAN:  Akyah Lagrange A. Ether GriffinsFowler, DPM  REASON FOR CONSULTATION: Right fifth toe osteomyelitis.   HISTORY OF PRESENT ILLNESS: A 39 year old man admitted with fever and chills. He has a history of paraplegia secondary to fall. He also has a decubitus ulceration in the sacral region. Upon admission there was noted redness to this right fifth toe that he states has been present for 2 weeks. X-rays were taken and MRI concerning for osteomyelitis. I was consulted for further evaluation and treatment of this.   PAST MEDICAL HISTORY: Neurogenic bladder, history of paraplegia, history of decubitus ulcer.   HOME MEDICATION: Ditropan.   ALLERGIES: CODEINE.  SOCIAL HISTORY: He smokes minimally. He denies alcohol or IV drug abuse.   REVIEW OF SYSTEMS: Per H and P, the patient has had recent fever and chills. He has no pain to the lower extremities secondary to paraplegia. No shortness of breath or chest pains.   PHYSICAL EXAMINATION:  GENERAL: He is alert and oriented.  VASCULAR: He has bare palpable DP and PT pulses to the right and left lower extremity.  NEUROLOGIC: He is completely neuropathic with flexion contracture to both lower extremities. The right foot is in marked plantar flexory state with spasticity.  DERMATOLOGIC: He had an open ulceration at his right fifth toe at the PIPJ. There is quite a bit of granulation tissue overlying this region, but with gentle probing I was able to probe down to the proximal interphalangeal joint region and bone. No purulence was noted though, the entire fifth toe is somewhat erythematous. Remainder of the foot is benign from an ulcerative standpoint. The left foot has no overt open ulcerations.  MUSCULOSKELETAL: He is in marked plantar flexory contracture with spasticity to the right lower extremity. Just mild edema to  the fifth toe. Marked instability of the fifth toe at the PIPJ consistent with erosive changes.   IMAGES: MRI and x-ray results were reviewed, which shows demineralization of the fifth toe with small erosive changes around the PIPJ with findings consistent on MRI for osteomyelitis of the fifth toe. Of note, the great toe was noted to have areas of concern for osteomyelitis and clinically this is healthy with no signs of any irritation, erythema or drainage. The patient does relate he has had surgical intervention to this great toe in the past and he believes they removed a portion of bone from the region.   ASSESSMENT: Right fifth toe osteomyelitis.   PLAN: We will discuss this with the patient in further detail to make plans for possible amputation versus continued long-term wound care on this right fifth toe. If he chooses to have surgery we can set this up in the next 1-2 days while he is hospitalized. He would undergo a fifth  toe amputation with primary closure likely. We will follow up with him over the next 1-2 days for final plan for surgery.    ____________________________ [place] DonovanJustin A. Ether GriffinsFowler, DPM jaf:TT D: 12/26/2013 18:57:39 ET T: 12/26/2013 20:35:05 ET JOB#: [id number]  cc: Jill AlexandersJustin A. Ether GriffinsFowler, DPM, <Dictator> Alexiss Iturralde DPM ELECTRONICALLY SIGNED 01/04/2014 13:51

## 2014-08-20 NOTE — Consult Note (Signed)
PATIENT NAME:  Jonathon York, Jonathon York MR#:  [id number] DATE OF BIRTH:  1976-01-01  DATE OF CONSULTATION:  12/24/2013    CONSULTING PHYSICIAN:  Cheral Marker. Ola Spurr, MD  REASON FOR CONSULTATION:  Infected sacral decubitus ulcer, and possible urinary tract infection and foot osteomyelitis.   HISTORY OF PRESENT ILLNESS: This is a 39 year old gentleman who is paraplegic from a traumatic fall, as well as has a history of neurogenic bladder and recurrent decubitus ulcers. He was admitted August 28th with fevers to 101.8, as well as fevers and chills.  He had some decreased oral intake as well.  He had been following at the wound care center for sacral decubitus ulcer and had a wound VAC on, however there had been increasing drainage and there was discharge from the wound.  In the ER, he was found to be hypotensive and tachycardic.  He was admitted and has been started on IV antibiotics.  Since then, he has been afebrile and his blood pressure and heart rate have improved.   He reports that he was admitted over at [facility] and treated with IV antibiotics and discharged to a skilled nursing facility in [place] from February to April of this year for similar issues with infection in foot as well as his sacral decubitus ulcer.   PAST MEDICAL HISTORY:  1.  Paraplegia from a fall.  2.  Neurogenic bladder with self-cath.  3.  Recurrent decubitus ulcers.   PAST SURGICAL HISTORY: Debridement of his decubitus ulcers in the past.  He denies ever having a skin flap.   SOCIAL HISTORY: Smokes 1 to 2 cigarettes per day.  No alcohol abuse.  Lives alone. His 66 year old daughter lives in [place].   FAMILY HISTORY: Noncontributory.   REVIEW OF SYSTEMS:  An 11 system reviewed and negative except as per history of present illness.   ALLERGIES: CODEINE.     HOME MEDICATIONS:  Include Ditropan.  He reports not having received any oral antibiotics over the last several weeks. He cannot recall the last time he has had any  antibiotics.   ANTIBIOTICS SINCE ADMISSION: Include vancomycin and Zosyn.   PHYSICAL EXAMINATION:  VITAL SIGNS: Temperature 98.1, pulse 88, blood pressure 96/57, respirations 20, sat 98% on room air.  GENERAL: He is lying in bed. He is paraplegic.  He has a very flat affect and speaks in a very quiet voice.  HEENT: Pupils equal, round, reactive to light and accommodation. Extraocular movements are intact. Sclerae are anicteric.  OROPHARYNX: Clear. NECK: Supple.  HEART: Regular.  LUNGS: Clear.  ABDOMEN: Soft, nontender, nondistended. No hepatosplenomegaly.  EXTREMITIES: He has contractures of bilateral lower extremity with muscle wasting.  His right foot has hyperkeratoses and skin thickening.  On the 5th toe of the right foot, he has erythema, redness and tenderness.  SKIN: There is a sacral decubitus ulcer stage IV. He also has 2 other wounds in the sacral area, stage IV, packed and bandaged.    LABORATORY DATA: White blood count was 10.5, hemoglobin 10.4, platelets 364. Renal function is normal with a creatinine of 0.65.  Blood cultures 2 of 2 are negative. Urinalysis had 21 white cells. Urine culture is pending.  Chest x-ray was negative.  Foot x-ray showed some demineralization and erosion in the middle phalanx of the right 5th toe which could indicate osteomyelitis.   IMPRESSION:  A 39 year old gentleman with paraplegia, neurogenic bladder and recurrent sacral decubitus.  He apparently was treated at [facility] in [place] from February to April with IV  antibiotics and a stay at a nursing home.  He is unsure of details of this.  He currently is readmitted with fever, sepsis and infected sacral decubitus ulcer.  He also has probable osteomyelitis of his right 5th toe.   RECOMMENDATIONS:  1.  Continue vancomycin and Zosyn pending culture results.  2.  MRI is pending of the foot. 3.  He may need surgical for further wound care evaluation.  4.  Check an ESR and CRP.  5.  Depending on further  evaluation, he may need a PICC line and IV antibiotics versus oral antibiotics and follow up with wound care.   Thank you for the consult. I will be glad to follow with you.     ____________________________ Cheral Marker. Ola Spurr, MD dpf:DT D: 12/24/2013 15:47:48 ET T: 12/24/2013 16:39:32 ET JOB#: [id number]  cc: Cheral Marker. Ola Spurr, MD, <Dictator> Axyl Sitzman Ola Spurr MD ELECTRONICALLY SIGNED 12/26/2013 22:25

## 2014-08-20 NOTE — Discharge Summary (Signed)
PATIENT NAME:  Jonathon York, Jonathon York MR#:  [id number] DATE OF BIRTH:  05/21/1975  DATE OF ADMISSION:  12/24/2013 DATE OF DISCHARGE:  12/30/2013  For a detailed note, please take a look at the history and physical done on admission by Dr. Randol Kern.   DIAGNOSES AT DISCHARGE: As follows: Sepsis secondary to right great toe osteomyelitis, status post amputation, history of paraplegia with chronic stage IV decubitus ulcer with a wound VAC. Neurogenic bladder.   DIET: The patient is being discharged on a regular diet.   ACTIVITY: As tolerated.   FOLLOWUP: With Dr. Gwyneth Revels or Dr. Clydie Braun in the next 2-3 weeks.   DISCHARGE MEDICATIONS: Ditropan 15 mg b.i.d. and Keflex 500 mg q. 8 hours x 4 weeks.   DISCHARGE INSTRUCTIONS: The patient is being discharged to a wound VAC to his sacral decubitus and also dry dressings to his right foot weekly.   CONSULTANTS DURING THE HOSPITAL COURSE: Clydie Braun, MD, infectious disease. Ida Rogue, MD, general surgery.  Gwyneth Revels, MD, podiatry.   PERTINENT STUDIES DONE DURING THE HOSPITAL COURSE: As follows: A chest x-ray done on admission showing pulmonary hyperinflation, no acute disease. X-ray of the right foot showing limited visualization of the right fifth toe due to field-of-view, erosion of the cortex of the middle phalanx and possibly the distal phalanx of the fifth toe which could indicate osteomyelitis. The MRI of the right foot showing findings suspicious for osteomyelitis of the great and small toes, associated soft tissue edema consistent with tissue infection, greatest with the small toe and no evidence of soft tissue abscess.   HOSPITAL COURSE: This is a 39 year old male with medical problems as mentioned above, presented to the hospital on 12/24/2013 due to fever, tachycardia and suspected to have sepsis secondary to osteomyelitis.  1.  Sepsis. The patient presented with fever, tachycardia and leukocytosis. Initially, the  source of the sepsis was unclear, but suspected to be the right great toe infection. The patient also has a chronic sacral decubitus, to which he gets chronic wound care, although that was not suspected to be the source of his sepsis. The patient was empirically started on vancomycin and Zosyn. Blood cultures were obtained. A podiatry consult was obtained to look at the right foot. The patient underwent an MRI which did confirm osteomyelitis. The patient underwent amputation of the right great toe. The wound cultures from the toe are still pending. Although his blood cultures have remained negative. His wound cultures from his sacral decubitus grew out Staphylococcus and group G Streptococcus, which was sensitive to just Keflex; therefore, as per infectious disease it is okay to discharge the patient just on oral Keflex for now. At this point, the patient was discharged on oral Keflex with close follow up with Dr. Sampson Goon as an outpatient. He will continue the oral Keflex for 3 weeks and continue local wound care for his sacral decubitus. The patient was also to follow up with Dr. Ether Griffins as an outpatient for his right lower extremity wound, as he had a recent amputation.  2.  Osteomyelitis. This was likely the cause of the patient's sepsis. This was the right great toe osteomyelitis. The patient underwent amputation, is currently postoperative day #2. The pain is  well controlled. The patient will follow up with Dr. Ether Griffins as an outpatient for dressing change in the next 2 weeks. He will continue home dressing changes with the help of home health nursing services.  3.  Chronic sacral decubitus. The patient is followed  by the Wound Center, has a chronic wound VAC at home. He will continue to follow up with the wound care nurse and continue his wound VAC at home. A surgical consult was obtained to see if the patient needed further surgical debridement of his sacral decubitus, but as per them this was not  necessary. The patient's blood cultures remained negative. His wound cultures grew out Staphylococcus aureus and group G Streptococcus for which he will continue the oral Keflex as stated.  4.  Neurogenic bladder. The patient is already on Ditropan. He will continue that. He self catheterizes himself every 6 hours at home and he will continue that.   DISPOSITION: The patient is being discharged home with resumption of his home health services.   TIME SPENT: Forty-five minutes.    ____________________________ Rolly PancakeVivek J. Cherlynn KaiserSainani, MD vjs:TT D: 12/30/2013 15:37:00 ET T: 12/30/2013 22:50:22 ET JOB#: [id number]  cc: Rolly PancakeVivek J. Cherlynn KaiserSainani, MD, <Dictator> Argentina DonovanJustin A. Ether GriffinsFowler, DPM Stann Mainlandavid P. Sampson GoonFitzgerald, MD Houston SirenVIVEK J Josceline Chenard MD ELECTRONICALLY SIGNED 01/05/2014 15:59

## 2014-08-20 NOTE — Consult Note (Signed)
Details:   - Evaluated MRI and xrays and shows osteomyelitis of right 5th toe.  Do not believe great is involved but can debride callus region and evaluate intra-op and debride further if warranted.  D/W pt surgery of right foot consisting of 5th toe amputation with debridemtn of great toe and possible partial amputation of great toe.  Pt consents and will plan for Tuesday for surgery.   Electronic Signatures: Gwyneth RevelsFowler, Tauna Macfarlane (MD)  (Signed 30-Aug-15 19:06)  Authored: Details   Last Updated: 30-Aug-15 19:06 by Gwyneth RevelsFowler, Cordero Surette (MD)

## 2014-08-20 NOTE — Op Note (Signed)
PATIENT NAME:  Jonathon York, [name] MR#:  [id number] DATE OF BIRTH:  01-07-1976  DATE OF PROCEDURE:  12/28/2013  PREOPERATIVE DIAGNOSES:  1.  Right fifth toe osteomyelitis.  2.  Ulcer, right great toe.   POSTOPERATIVE DIAGNOSES:  1.  Right fifth toe osteomyelitis.  2.  Ulcer, right great toe.   PROCEDURES: 1.  Amputation, right fifth MTPJ.  2.  Superficial debridement, excisional type, right great toe distal aspect.   SURGEON: Malaquias Lenker A. Ether GriffinsFowler, DPM.   ANESTHESIA: MAC with local.   HEMOSTASIS: None.   COMPLICATIONS: None.   SPECIMEN: Right fifth toe with osteomyelitis.   OPERATIVE INDICATIONS: This is a 39 year old gentleman who has developed an ulceration with osteomyelitis of his fifth toe and a superficial ulceration on his right great toe. He presents today for surgical intervention. All risks, benefits, alternatives, and complications associated with surgery were discussed with the patient and full consent has been given.   OPERATIVE PROCEDURE: The patient was brought into the OR and placed on the operating table in the supine position. IV sedation was administered by the anesthesia team. A local block was placed around the fifth toe. After a sterile prep and drape, attention was directed to the right fifth toe where 2 semielliptical incisions were made at the MTPJ region. Sharp and blunt dissection was carried down to the toe. The toe was then disarticulated at the MTPJ. This was removed from the surgical field in toto. The wound was flushed with copious amounts of irrigation and closure was performed with a 4-0 nylon for the skin. A superficial debridement, excisional type, with a 15-blade to the distal aspect of the right great toe was performed. This was taken down through the epidermis into the dermal layer. A total of approximately 1 cm of excisional debridement was performed. All areas were then covered with a sterile bulky gauze. He will be readmitted to the floor. I will see him  in the outpatient clinic after discharge    ____________________________ Argentina DonovanJustin A. Ether GriffinsFowler, DPM jaf:lr D: 12/28/2013 13:12:25 ET T: 12/28/2013 14:13:50 ET JOB#: [id number]  cc: Jill AlexandersJustin A. Ether GriffinsFowler, DPM, <Dictator> Deklyn Trachtenberg DPM ELECTRONICALLY SIGNED 01/04/2014 13:51

## 2015-03-01 DIAGNOSIS — N390 Urinary tract infection, site not specified: Secondary | ICD-10-CM | POA: Diagnosis not present

## 2015-03-01 DIAGNOSIS — F17210 Nicotine dependence, cigarettes, uncomplicated: Secondary | ICD-10-CM | POA: Diagnosis not present

## 2015-03-01 DIAGNOSIS — G8220 Paraplegia, unspecified: Secondary | ICD-10-CM | POA: Diagnosis not present

## 2015-03-01 DIAGNOSIS — L8995 Pressure ulcer of unspecified site, unstageable: Secondary | ICD-10-CM | POA: Diagnosis not present

## 2015-03-01 DIAGNOSIS — R300 Dysuria: Secondary | ICD-10-CM | POA: Diagnosis not present

## 2015-03-01 DIAGNOSIS — L89324 Pressure ulcer of left buttock, stage 4: Secondary | ICD-10-CM | POA: Diagnosis not present

## 2015-03-01 DIAGNOSIS — L8990 Pressure ulcer of unspecified site, unspecified stage: Secondary | ICD-10-CM | POA: Diagnosis not present

## 2015-03-01 DIAGNOSIS — Z87448 Personal history of other diseases of urinary system: Secondary | ICD-10-CM | POA: Diagnosis not present

## 2015-03-01 DIAGNOSIS — N319 Neuromuscular dysfunction of bladder, unspecified: Secondary | ICD-10-CM | POA: Diagnosis not present

## 2015-03-01 DIAGNOSIS — L89154 Pressure ulcer of sacral region, stage 4: Secondary | ICD-10-CM | POA: Diagnosis not present

## 2015-04-28 ENCOUNTER — Emergency Department: Payer: Medicare Other

## 2015-04-28 ENCOUNTER — Inpatient Hospital Stay
Admission: EM | Admit: 2015-04-28 | Discharge: 2015-04-30 | DRG: 503 | Disposition: A | Discharge status: Home / Self Care | Payer: Medicare Other | Attending: Internal Medicine | Admitting: Internal Medicine

## 2015-04-28 DIAGNOSIS — Z681 Body mass index (BMI) 19 or less, adult: Secondary | ICD-10-CM

## 2015-04-28 DIAGNOSIS — N3289 Other specified disorders of bladder: Secondary | ICD-10-CM | POA: Diagnosis present

## 2015-04-28 DIAGNOSIS — M869 Osteomyelitis, unspecified: Secondary | ICD-10-CM | POA: Diagnosis present

## 2015-04-28 DIAGNOSIS — B999 Unspecified infectious disease: Secondary | ICD-10-CM | POA: Diagnosis not present

## 2015-04-28 DIAGNOSIS — Z79899 Other long term (current) drug therapy: Secondary | ICD-10-CM | POA: Diagnosis not present

## 2015-04-28 DIAGNOSIS — N319 Neuromuscular dysfunction of bladder, unspecified: Secondary | ICD-10-CM | POA: Diagnosis not present

## 2015-04-28 DIAGNOSIS — J45909 Unspecified asthma, uncomplicated: Secondary | ICD-10-CM | POA: Diagnosis present

## 2015-04-28 DIAGNOSIS — F17200 Nicotine dependence, unspecified, uncomplicated: Secondary | ICD-10-CM | POA: Diagnosis present

## 2015-04-28 DIAGNOSIS — L97519 Non-pressure chronic ulcer of other part of right foot with unspecified severity: Secondary | ICD-10-CM | POA: Diagnosis present

## 2015-04-28 DIAGNOSIS — L89153 Pressure ulcer of sacral region, stage 3: Secondary | ICD-10-CM | POA: Diagnosis present

## 2015-04-28 DIAGNOSIS — S91109A Unspecified open wound of unspecified toe(s) without damage to nail, initial encounter: Secondary | ICD-10-CM | POA: Diagnosis not present

## 2015-04-28 DIAGNOSIS — S98131A Complete traumatic amputation of one right lesser toe, initial encounter: Secondary | ICD-10-CM

## 2015-04-28 DIAGNOSIS — Z89421 Acquired absence of other right toe(s): Secondary | ICD-10-CM | POA: Diagnosis not present

## 2015-04-28 DIAGNOSIS — G8220 Paraplegia, unspecified: Secondary | ICD-10-CM

## 2015-04-28 DIAGNOSIS — Z7401 Bed confinement status: Secondary | ICD-10-CM | POA: Diagnosis not present

## 2015-04-28 DIAGNOSIS — M86171 Other acute osteomyelitis, right ankle and foot: Secondary | ICD-10-CM | POA: Diagnosis present

## 2015-04-28 DIAGNOSIS — D638 Anemia in other chronic diseases classified elsewhere: Secondary | ICD-10-CM | POA: Diagnosis present

## 2015-04-28 DIAGNOSIS — M868X7 Other osteomyelitis, ankle and foot: Secondary | ICD-10-CM | POA: Diagnosis not present

## 2015-04-28 DIAGNOSIS — E440 Moderate protein-calorie malnutrition: Secondary | ICD-10-CM

## 2015-04-28 DIAGNOSIS — L8993 Pressure ulcer of unspecified site, stage 3: Secondary | ICD-10-CM | POA: Insufficient documentation

## 2015-04-28 DIAGNOSIS — N3000 Acute cystitis without hematuria: Secondary | ICD-10-CM

## 2015-04-28 DIAGNOSIS — R8281 Pyuria: Secondary | ICD-10-CM

## 2015-04-28 DIAGNOSIS — Z993 Dependence on wheelchair: Secondary | ICD-10-CM | POA: Diagnosis not present

## 2015-04-28 DIAGNOSIS — M89571 Osteolysis, right ankle and foot: Secondary | ICD-10-CM | POA: Diagnosis not present

## 2015-04-28 HISTORY — DX: Paraplegia, unspecified: G82.20

## 2015-04-28 LAB — COMPREHENSIVE METABOLIC PANEL
ALBUMIN: 3.6 g/dL (ref 3.5–5.0)
ALK PHOS: 75 U/L (ref 38–126)
ALT: 11 U/L — AB (ref 17–63)
AST: 11 U/L — AB (ref 15–41)
Anion gap: 6 (ref 5–15)
BILIRUBIN TOTAL: 0.2 mg/dL — AB (ref 0.3–1.2)
BUN: 13 mg/dL (ref 6–20)
CO2: 28 mmol/L (ref 22–32)
CREATININE: 0.64 mg/dL (ref 0.61–1.24)
Calcium: 8.8 mg/dL — ABNORMAL LOW (ref 8.9–10.3)
Chloride: 105 mmol/L (ref 101–111)
GFR calc Af Amer: >60 mL/min (ref >60)
GFR calc non Af Amer: >60 mL/min (ref >60)
GLUCOSE: 88 mg/dL (ref 65–99)
POTASSIUM: 3.8 mmol/L (ref 3.5–5.1)
Sodium: 139 mmol/L (ref 135–145)
TOTAL PROTEIN: 7.1 g/dL (ref 6.5–8.1)

## 2015-04-28 LAB — URINALYSIS COMPLETE WITH MICROSCOPIC (ARMC ONLY)
BILIRUBIN URINE: NEGATIVE
Glucose, UA: NEGATIVE mg/dL
Hgb urine dipstick: NEGATIVE
Ketones, ur: NEGATIVE mg/dL
Nitrite: POSITIVE — AB
PH: 7 (ref 5.0–8.0)
PROTEIN: 30 mg/dL — AB
Specific Gravity, Urine: 1.024 (ref 1.005–1.030)

## 2015-04-28 LAB — SEDIMENTATION RATE: Sed Rate: 38 mm/hr — ABNORMAL HIGH (ref 0–15)

## 2015-04-28 LAB — CBC
HEMATOCRIT: 32.6 % — AB (ref 40.0–52.0)
HEMOGLOBIN: 10 g/dL — AB (ref 13.0–18.0)
MCH: 20.9 pg — ABNORMAL LOW (ref 26.0–34.0)
MCHC: 30.8 g/dL — ABNORMAL LOW (ref 32.0–36.0)
MCV: 68 fL — ABNORMAL LOW (ref 80.0–100.0)
Platelets: 386 10*3/uL (ref 150–440)
RBC: 4.8 MIL/uL (ref 4.40–5.90)
RDW: 20.5 % — AB (ref 11.5–14.5)
WBC: 12.1 10*3/uL — AB (ref 3.8–10.6)

## 2015-04-28 MED ORDER — VANCOMYCIN HCL IN DEXTROSE 750-5 MG/150ML-% IV SOLN
750.0000 mg | Freq: Two times a day (BID) | INTRAVENOUS | Status: DC
  Administered 2015-04-28 – 2015-04-30 (×4): 750 mg via INTRAVENOUS
  Filled 2015-04-28 (×4): qty 150

## 2015-04-28 MED ORDER — OXYBUTYNIN CHLORIDE ER 15 MG PO TB24
15.0000 mg | ORAL_TABLET | Freq: Two times a day (BID) | ORAL | Status: DC
  Administered 2015-04-28: 15 mg via ORAL

## 2015-04-28 MED ORDER — ONDANSETRON HCL 4 MG PO TABS: 4.0000 mg | ORAL_TABLET | Freq: Four times a day (QID) | ORAL | Status: DC | PRN

## 2015-04-28 MED ORDER — MORPHINE SULFATE (PF) 2 MG/ML IV SOLN
2.0000 mg | Freq: Once | INTRAVENOUS | Status: AC
  Administered 2015-04-28: 2 mg via INTRAVENOUS
  Filled 2015-04-28: qty 1

## 2015-04-28 MED ORDER — PIPERACILLIN-TAZOBACTAM 3.375 G IVPB
3.3750 g | Freq: Three times a day (TID) | INTRAVENOUS | Status: DC
Start: 2015-04-28 — End: 2015-04-30
  Administered 2015-04-28 – 2015-04-30 (×6): 3.375 g via INTRAVENOUS
  Filled 2015-04-28 (×8): qty 50

## 2015-04-28 MED ORDER — HYDROCODONE-ACETAMINOPHEN 5-325 MG PO TABS
1.0000 | ORAL_TABLET | ORAL | Status: DC | PRN
  Administered 2015-04-28 – 2015-04-29 (×4): 2 via ORAL
  Filled 2015-04-28: qty 1
  Filled 2015-04-28: qty 2
  Filled 2015-04-28: qty 1
  Filled 2015-04-28 (×2): qty 2

## 2015-04-28 MED ORDER — FLEET ENEMA 7-19 GM/118ML RE ENEM: 1.0000 | ENEMA | Freq: Once | RECTAL | Status: DC | PRN

## 2015-04-28 MED ORDER — VANCOMYCIN HCL IN DEXTROSE 1-5 GM/200ML-% IV SOLN
1000.0000 mg | Freq: Once | INTRAVENOUS | Status: AC
  Administered 2015-04-28: 1000 mg via INTRAVENOUS
  Filled 2015-04-28: qty 200

## 2015-04-28 MED ORDER — ALBUTEROL SULFATE (2.5 MG/3ML) 0.083% IN NEBU: 2.5000 mg | INHALATION_SOLUTION | RESPIRATORY_TRACT | Status: DC | PRN

## 2015-04-28 MED ORDER — ACETAMINOPHEN 325 MG PO TABS: 650.0000 mg | ORAL_TABLET | Freq: Four times a day (QID) | ORAL | Status: DC | PRN

## 2015-04-28 MED ORDER — OXYBUTYNIN CHLORIDE ER 5 MG PO TB24
15.0000 mg | ORAL_TABLET | Freq: Every day | ORAL | Status: DC
  Filled 2015-04-28 (×2): qty 3

## 2015-04-28 MED ORDER — POLYETHYLENE GLYCOL 3350 17 G PO PACK: 17.0000 g | PACK | Freq: Every day | ORAL | Status: DC | PRN

## 2015-04-28 MED ORDER — PHENAZOPYRIDINE HCL 100 MG PO TABS
100.0000 mg | ORAL_TABLET | Freq: Three times a day (TID) | ORAL | Status: DC
  Administered 2015-04-28: 100 mg via ORAL
  Filled 2015-04-28 (×3): qty 1

## 2015-04-28 MED ORDER — DIPHENHYDRAMINE HCL 25 MG PO CAPS
25.0000 mg | ORAL_CAPSULE | ORAL | Status: DC | PRN
  Administered 2015-04-29: 25 mg via ORAL
  Filled 2015-04-28: qty 1

## 2015-04-28 MED ORDER — ONDANSETRON HCL 4 MG/2ML IJ SOLN: 4.0000 mg | Freq: Four times a day (QID) | INTRAMUSCULAR | Status: DC | PRN

## 2015-04-28 MED ORDER — MORPHINE SULFATE (PF) 2 MG/ML IV SOLN: 2.0000 mg | INTRAVENOUS | Status: DC | PRN

## 2015-04-28 MED ORDER — DIPHENHYDRAMINE HCL 50 MG/ML IJ SOLN
25.0000 mg | Freq: Once | INTRAMUSCULAR | Status: AC
  Administered 2015-04-28: 25 mg via INTRAVENOUS
  Filled 2015-04-28: qty 1

## 2015-04-28 MED ORDER — BISACODYL 5 MG PO TBEC
5.0000 mg | DELAYED_RELEASE_TABLET | Freq: Every day | ORAL | Status: DC | PRN
  Filled 2015-04-28: qty 1

## 2015-04-28 MED ORDER — ACETAMINOPHEN 650 MG RE SUPP
650.0000 mg | Freq: Four times a day (QID) | RECTAL | Status: DC | PRN
Start: 2015-04-28 — End: 2015-04-30

## 2015-04-28 MED ORDER — ONDANSETRON HCL 4 MG/2ML IJ SOLN
4.0000 mg | Freq: Once | INTRAMUSCULAR | Status: AC
  Administered 2015-04-28: 4 mg via INTRAVENOUS
  Filled 2015-04-28: qty 2

## 2015-04-28 MED ORDER — PIPERACILLIN-TAZOBACTAM 3.375 G IVPB
3.3750 g | Freq: Once | INTRAVENOUS | Status: AC
  Administered 2015-04-28: 3.375 g via INTRAVENOUS
  Filled 2015-04-28: qty 50

## 2015-04-28 MED ORDER — DOCUSATE SODIUM 100 MG PO CAPS
100.0000 mg | ORAL_CAPSULE | Freq: Two times a day (BID) | ORAL | Status: DC
  Filled 2015-04-28 (×3): qty 1

## 2015-04-28 MED ORDER — OXYBUTYNIN CHLORIDE ER 5 MG PO TB24
15.0000 mg | ORAL_TABLET | Freq: Two times a day (BID) | ORAL | Status: DC
  Administered 2015-04-28 – 2015-04-30 (×3): 15 mg via ORAL
  Filled 2015-04-28 (×6): qty 3

## 2015-04-28 MED ORDER — ENOXAPARIN SODIUM 40 MG/0.4ML ~~LOC~~ SOLN
40.0000 mg | SUBCUTANEOUS | Status: DC
  Administered 2015-04-28: 40 mg via SUBCUTANEOUS
  Filled 2015-04-28: qty 0.4

## 2015-04-28 NOTE — Progress Notes (Signed)
[facility] [place] [facility]   PATIENT NAME: Jonathon LyRichard Ozdemir    MR#:  [id number]  DATE OF BIRTH:  [birth year]  SUBJECTIVE:  CHIEF COMPLAINT:   Chief Complaint  Patient presents with  . Osteomyelitis     Right great toe and fourth toe - wheelchair bound   patient is a 39 year old Caucasian male with asthma, history significant for history of paraplegia, neurogenic bladder with intermittent self catheterizations who presents to the hospital with right foot ulceration and pain in the foot. X-ray in emergency room revealed osteomyelitis of third and fourth toes, patient is admitted to the hospital for further evaluation and treatment. Podiatry saw patient in consultation and felt that he had fourth toe infection but not third toe. Crepitation was suggested for which patient was agreeable. Patient is on vancomycin and Azactam. He developed itching to antibiotics. Unclear which. He complains of bladder spasms, and right foot pain. Patient is afebrile, blood cultures were not taken.     Review of Systems  Constitutional: Positive for malaise/fatigue. Negative for fever, chills and weight loss.  HENT: Negative for congestion.   Eyes: Negative for blurred vision and double vision.  Respiratory: Negative for cough, sputum production, shortness of breath and wheezing.   Cardiovascular: Negative for chest pain, palpitations, orthopnea, leg swelling and PND.  Gastrointestinal: Negative for nausea, vomiting, abdominal pain, diarrhea, constipation and blood in stool.  Genitourinary: Positive for dysuria. Negative for urgency, frequency and hematuria.  Musculoskeletal: Positive for joint pain. Negative for falls.  Neurological: Negative for dizziness, tremors, focal weakness and headaches.  Endo/Heme/Allergies: Does not bruise/bleed easily.  Psychiatric/Behavioral: Negative for depression. The patient does not have insomnia.     VITAL SIGNS: Blood pressure 91/62, pulse  83, temperature 97.9 F (36.6 C), temperature source Oral, resp. rate 18, height 5\' 8"  (1.727 m), weight 45.36 kg (100 lb), SpO2 99 %.  PHYSICAL EXAMINATION:   GENERAL:  39 y.o.-year-old patient lying in the bed with no acute distress.  EYES: Pupils equal, round, reactive to light and accommodation. No scleral icterus. Extraocular muscles intact.  HEENT: Head atraumatic, normocephalic. Oropharynx and nasopharynx clear.  NECK:  Supple, no jugular venous distention. No thyroid enlargement, no tenderness.  LUNGS: Normal breath sounds bilaterally, no wheezing, rales,rhonchi or crepitation. No use of accessory muscles of respiration.  CARDIOVASCULAR: S1, S2 normal. No murmurs, rubs, or gallops.  ABDOMEN: Soft, nontender, nondistended. Bowel sounds present. No organomegaly or mass.  EXTREMITIES: No pedal edema, cyanosis, or clubbing.  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 Right foot has missing fifths to fourth toe has ulceration in the dorsal aspect with no significant drainage, however, some swelling and pain was noted throughout the foot during palpation , no fluctuation was noted  NEUROLOGIC: Cranial nerves II through XII are intact. Muscle strength 5/5 in all extremities. Sensation intact. Gait not checked.  PSYCHIATRIC: The patient is alert and oriented x 3.  SKIN: No obvious rash, lesion, or ulcer.   ORDERS/RESULTS REVIEWED:   CBC  Recent Labs Lab 04/28/15 0345  WBC 12.1*  HGB 10.0*  HCT 32.6*  PLT 386  MCV 68.0*  MCH 20.9*  MCHC 30.8*  RDW 20.5*   ------------------------------------------------------------------------------------------------------------------  Chemistries   Recent Labs Lab 04/28/15 0345  NA 139  K 3.8  CL 105  CO2 28  GLUCOSE 88  BUN 13  CREATININE 0.64  CALCIUM 8.8*  AST 11*  ALT 11*  ALKPHOS 75  BILITOT 0.2*   ------------------------------------------------------------------------------------------------------------------ estimated creatinine  clearance is 79.6 mL/min (by C-G formula based on Cr of 0.64). ------------------------------------------------------------------------------------------------------------------ No results for input(s): TSH, T4TOTAL, T3FREE, THYROIDAB in the last 72 hours.  Invalid input(s): FREET3  Cardiac Enzymes No results for input(s): CKMB, TROPONINI, MYOGLOBIN in the last 168 hours.  Invalid input(s): CK ------------------------------------------------------------------------------------------------------------------ Invalid input(s): POCBNP ---------------------------------------------------------------------------------------------------------------  RADIOLOGY: Dg Foot Complete Right  04/28/2015  CLINICAL DATA:  Possible toe osteomyelitis. EXAM: RIGHT FOOT COMPLETE - 3+ VIEW COMPARISON:  RIGHT foot radiograph March 15, 2015 FINDINGS: Further bony reabsorption of the tuft of third distal phalanx. Further bony reabsorption of proximal fourth phalanx and, of the fourth distal interphalangeal joint. Status post fifth toe amputation. Osteopenia without acute fracture deformity or dislocation. No subcutaneous gas or radiopaque foreign bodies. IMPRESSION: Further osteolysis of third and fourth toes concerning for osteomyelitis. Electronically Signed   By: Awilda Metro M.D.   On: 04/28/2015 03:51    EKG:  Orders placed or performed in visit on 12/24/13  . EKG 12-Lead    ASSESSMENT AND PLAN:  Active Problems:   Osteomyelitis (HCC)   Malnutrition of moderate degree 1. Right fourth toe osteomyelitis, appreciate podiatry consultation, patient is to continue antibiotic therapy, broad-spectrum, he is for forced to amputation tomorrow,  31st of December 2016, continue pain medications 2. Neurogenic bladder, add Pyridium to help with spasms, continue Ditropan. She has intermittent leakage of urine, so Foley catheter will be inserted  3. Moderate degree calorie protein malnutrition, dietary consultation  is appreciated, supportive care 4. Leukocytosis, follow with therapy 5. Itching , possibly related to medications , Benadryl will be initiated  6. Urinary tract infection , acute cystitis without hematuria , continue broad-spectrum antibiotic therapy. Get urine cultures    Management plans discussed with the patient, family and they are in agreement.   DRUG ALLERGIES: No Known Allergies  CODE STATUS:     Code Status Orders        Start     Ordered   04/28/15 0437  Full code   Continuous     04/28/15 0438      TOTAL TIME TAKING CARE OF THIS PATIENT: 45 minutes.    all questions were answered  Iolani Twilley M.D on 04/28/2015 at 3:51 PM  Between 7am to 6pm - Pager - [phone]  After 6pm go to [email or web address] - password [email or web address] [facility]  [facility]  Office  [phone]  CC: Primary care physician; No primary care provider on file.

## 2015-04-28 NOTE — Progress Notes (Signed)
Initial Nutrition Assessment  DOCUMENTATION CODES:   Non-severe (moderate) malnutrition in context of chronic illness  INTERVENTION:   Meals and Snacks: Cater to patient preferences; will recommend double portions of protein on meal trays; will send Malawiturkey sandwich for bedtime snacks, and chicken salad as afternoon snack Medical Food Supplement Therapy: will recommend Carnation Instant Breakfast TID    NUTRITION DIAGNOSIS:   Malnutrition related to chronic illness as evidenced by moderate depletion of body fat, severe depletion of muscle mass.  GOAL:   Patient will meet greater than or equal to 90% of their needs  MONITOR:    (Energy Intake, Skin, Anthropometrics, Digestive system)  REASON FOR ASSESSMENT:   Consult Assessment of nutrition requirement/status  ASSESSMENT:   Pt admitted with right foot ulcers and osteomyelitis. Pt with h/o paraplegia and is wheel chair bound. Per chart, pt lives alone and transfers himself from bed to wheelchair.  History reviewed. No pertinent past medical history.   Diet Order:  Diet regular Room service appropriate?: Yes; Fluid consistency:: Thin Diet NPO time specified   Current Nutrition: Pt reports eating breakfast this am of eggs and bacon and tolerating well; recorded 50% of meal tray  Food/Nutrition-Related History: Pt reports not eating hardly anything for the past 5 days PTA secondary to nausea and some vomitting. Pt reports prior to the past 5 days appetite and eating habits were very different than usual as [place] his daughter's wedding. Normally pt reports having a good appetite eating well.   Scheduled Medications:  . docusate sodium  100 mg Oral BID  . enoxaparin (LOVENOX) injection  40 mg Subcutaneous Q24H  . oxybutynin  15 mg Oral BID WC  . piperacillin-tazobactam (ZOSYN)  IV  3.375 g Intravenous 3 times per day  . vancomycin  750 mg Intravenous Q12H      Electrolyte/Renal Profile and Glucose Profile:   Recent Labs Lab 04/28/15 0345  NA 139  K 3.8  CL 105  CO2 28  BUN 13  CREATININE 0.64  CALCIUM 8.8*  GLUCOSE 88   Protein Profile:  Recent Labs Lab 04/28/15 0345  ALBUMIN 3.6    Gastrointestinal Profile: Last BM: 04/27/2015   Nutrition-Focused Physical Exam Findings: Nutrition-Focused physical exam completed. Findings are mild-moderate fat depletion, moderate-severe muscle depletion, and no edema.     Weight Change: Pt reports weight of 96lbs one year ago and weight of 92lbs this am. (4% weight loss in one year)   Skin:   sacral pressure ulcer and right toe ulcers per Nsg  Height:   Ht Readings from Last 1 Encounters:  04/28/15 5\' 8"  (1.727 m)    Weight:   Wt Readings from Last 1 Encounters:  04/28/15 100 lb (45.36 kg)    Ideal Body Weight:   70kg  BMI:  Body mass index is 15.21 kg/(m^2).  Estimated Nutritional Needs:   Kcal:  BEE: 1585kcals, TEE: (IF 1.1-1.3)(AF 1.2) 2092-2472kcals, using IBW of 70kg  Protein:  84-105g protein (1.2-1.5g/kg)  Fluid:  1750-214000mL of fluid (25-4430mL/kg)  EDUCATION NEEDS:   No education needs identified at this time   HIGH Care Level  Leda QuailAllyson Breslin Burklow, RD, LDN Pager [phone] Weekend/On-Call Pager [phone]

## 2015-04-28 NOTE — Care Management Important Message (Signed)
Important Message  Patient Details  Name: Jonathon York MRN: [id number] Date of Birth: 07/03/1975   Medicare Important Message Given:  Yes    [name] A, RN 04/28/2015, 8:57 AM

## 2015-04-28 NOTE — Progress Notes (Signed)
ANTIBIOTIC CONSULT NOTE - INITIAL  Pharmacy Consult for vancomycin and Zosyn dosing Indication: osteomyelitis/wound infection  No Known Allergies  Patient Measurements: Height: 5\' 8"  (172.7 cm) Weight: 100 lb (45.36 kg) IBW/kg (Calculated) : 68.4 Adjusted Body Weight: 45.4kg  Vital Signs: Temp: 97.6 F (36.4 C) (12/30 0619) Temp Source: Oral (12/30 0619) BP: 103/57 mmHg (12/30 0619) Pulse Rate: 86 (12/30 0619) Intake/Output from previous day: 12/29 0701 - 12/30 0700 In: 480 [P.O.:480] Out: 0  Intake/Output from this shift: Total I/O In: 480 [P.O.:480] Out: 0   Labs:  Recent Labs  04/28/15 0345  WBC 12.1*  HGB 10.0*  PLT 386  CREATININE 0.64   Estimated Creatinine Clearance: 79.6 mL/min (by C-G formula based on Cr of 0.64). No results for input(s): VANCOTROUGH, VANCOPEAK, VANCORANDOM, GENTTROUGH, GENTPEAK, GENTRANDOM, TOBRATROUGH, TOBRAPEAK, TOBRARND, AMIKACINPEAK, AMIKACINTROU, AMIKACIN in the last 72 hours.   Microbiology: No results found for this or any previous visit (from the past 720 hour(s)).  Medical History: History reviewed. No pertinent past medical history.  Medications:   Assessment: Wound cx pending R foot imaging: osteomyelitis 3rd and 4th toe  Goal of Therapy:  Vancomycin trough level 15-20 mcg/ml  Plan:  TBW 45.4kg  IBW 68.4kg  DW 45.4kg  Vd 32L kei 0.071 hr-1  t1/2 10 hours Vancomycin 750 mg IV q 12 hours ordered with stacked dosing. Level before 5th dose.  Zosyn 3.375 grams q 8 hours ordered.    Jonathon York S 04/28/2015,6:41 AM

## 2015-04-28 NOTE — Progress Notes (Signed)
Patient requested indwelling foley due to incontinent episodes and wounds to sacrum.  Dr.  Patrina LeveringVacikute called and notified.  Telephone order obtained for foley.

## 2015-04-28 NOTE — Consult Note (Signed)
Reason for Consult: Osteomyelitis right third and fourth toes Referring Physician: [facility]  Jonathon York is an 39 y.o. male.  HPI: The patient relates a history over the last week or 2 of a draining ulceration on his right fourth toe. He has been trying to care for this himself at home. Does relate a previous history of a similar problem on his fifth toe which required an amputation. Recently admitted through the emergency department for this as well as some other issues regarding an ulceration on his back and urinary tract infection  History reviewed. No pertinent past medical history.  Past Surgical History  Procedure Laterality Date  . Toe amputation Right   . Irrigation and debridement buttocks      History reviewed. No pertinent family history.  Social History:  reports that he has been smoking.  He does not have any smokeless tobacco history on file. He reports that he does not drink alcohol. His drug history is not on file.  Allergies: No Known Allergies  Medications:  Scheduled: . docusate sodium  100 mg Oral BID  . enoxaparin (LOVENOX) injection  40 mg Subcutaneous Q24H  . oxybutynin  15 mg Oral BID WC  . piperacillin-tazobactam (ZOSYN)  IV  3.375 g Intravenous 3 times per day  . vancomycin  750 mg Intravenous Q12H    Results for orders placed or performed during the hospital encounter of 04/28/15 (from the past 48 hour(s))  CBC     Status: Abnormal   Collection Time: 04/28/15  3:45 AM  Result Value Ref Range   WBC 12.1 (H) 3.8 - 10.6 K/uL   RBC 4.80 4.40 - 5.90 MIL/uL   Hemoglobin 10.0 (L) 13.0 - 18.0 g/dL   HCT 32.6 (L) 40.0 - 52.0 %   MCV 68.0 (L) 80.0 - 100.0 fL   MCH 20.9 (L) 26.0 - 34.0 pg   MCHC 30.8 (L) 32.0 - 36.0 g/dL   RDW 20.5 (H) 11.5 - 14.5 %   Platelets 386 150 - 440 K/uL  Comprehensive metabolic panel     Status: Abnormal   Collection Time: 04/28/15  3:45 AM  Result Value Ref Range   Sodium 139 135 - 145 mmol/L   Potassium 3.8 3.5 - 5.1 mmol/L   Chloride 105 101 - 111 mmol/L   CO2 28 22 - 32 mmol/L   Glucose, Bld 88 65 - 99 mg/dL   BUN 13 6 - 20 mg/dL   Creatinine, Ser 0.64 0.61 - 1.24 mg/dL   Calcium 8.8 (L) 8.9 - 10.3 mg/dL   Total Protein 7.1 6.5 - 8.1 g/dL   Albumin 3.6 3.5 - 5.0 g/dL   AST 11 (L) 15 - 41 U/L   ALT 11 (L) 17 - 63 U/L   Alkaline Phosphatase 75 38 - 126 U/L   Total Bilirubin 0.2 (L) 0.3 - 1.2 mg/dL   GFR calc non Af Amer >60 >60 mL/min   GFR calc Af Amer >60 >60 mL/min    Comment: (NOTE) The eGFR has been calculated using the CKD EPI equation. This calculation has not been validated in all clinical situations. eGFR's persistently <60 mL/min signify possible Chronic Kidney Disease.    Anion gap 6 5 - 15  Sedimentation rate     Status: Abnormal   Collection Time: 04/28/15  3:45 AM  Result Value Ref Range   Sed Rate 38 (H) 0 - 15 mm/hr  Urinalysis complete, with microscopic ([facility] only)  Status: Abnormal   Collection Time: 04/28/15  4:24 AM  Result Value Ref Range   Color, Urine YELLOW (A) YELLOW   APPearance CLOUDY (A) CLEAR   Glucose, UA NEGATIVE NEGATIVE mg/dL   Bilirubin Urine NEGATIVE NEGATIVE   Ketones, ur NEGATIVE NEGATIVE mg/dL   Specific Gravity, Urine 1.024 1.005 - 1.030   Hgb urine dipstick NEGATIVE NEGATIVE   pH 7.0 5.0 - 8.0   Protein, ur 30 (A) NEGATIVE mg/dL   Nitrite POSITIVE (A) NEGATIVE   Leukocytes, UA 2+ (A) NEGATIVE   RBC / HPF 0-5 0 - 5 RBC/hpf   WBC, UA TOO NUMEROUS TO COUNT 0 - 5 WBC/hpf   Bacteria, UA MANY (A) NONE SEEN   Squamous Epithelial / LPF 0-5 (A) NONE SEEN   Mucous PRESENT     Dg Foot Complete Right  04/28/2015  CLINICAL DATA:  Possible toe osteomyelitis. EXAM: RIGHT FOOT COMPLETE - 3+ VIEW COMPARISON:  RIGHT foot radiograph March 15, 2015 FINDINGS: Further bony reabsorption of the tuft of third distal phalanx. Further bony reabsorption of proximal fourth phalanx and, of the fourth distal interphalangeal joint. Status post  fifth toe amputation. Osteopenia without acute fracture deformity or dislocation. No subcutaneous gas or radiopaque foreign bodies. IMPRESSION: Further osteolysis of third and fourth toes concerning for osteomyelitis. Electronically Signed   By: Elon Alas M.D.   On: 04/28/2015 03:51    Review of Systems  Constitutional: Positive for fever and chills.  HENT: Negative.   Eyes: Negative.   Respiratory: Negative.   Cardiovascular: Negative.   Gastrointestinal: Positive for nausea.  Genitourinary: Negative.   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   Endo/Heme/Allergies: Negative.   Psychiatric/Behavioral: Negative.    Blood pressure 102/63, pulse 86, temperature 97.8 F (36.6 C), temperature source Oral, resp. rate 18, height _0  (1.727 m), weight 45.36 kg (100 lb), SpO2 99 %. Physical Exam  Cardiovascular:  DP and PT pulses are fully palpable. Capillary filling time intact  Musculoskeletal:  Contractures in the lower extremities. Hammertoe contractures noted as well. Diminished muscle mass and tone  Neurological:  Epicritic sensations are grossly intact with some mild impairment  Skin:  The skin was then dry and somewhat atrophic. Full-thickness ulceration with some drainage along the lateral aspect of the right fourth toe. Some hemorrhagic hyperkeratosis at the distal tips of the second and third toes but no active ulcerations.    Assessment/Plan: Assessment: Osteomyelitis right fourth toe  Plan: Discussed with the patient that the fourth toe is clearly infected. At this point the third does not clearly appear to be osteomyelitis as was suggested by the x-rays. Discussed with the patient amputation of the fourth toe and we will plan on performing this tomorrow morning. Discussed the procedure. Questions were invited and  answered. Light bandage applied to the right fourth toe since it has been draining on the bed sheets. Consent for amputation of the right fourth toe. Nothing by mouth after midnight. Plan for surgery tomorrow morning  Tamea Bai W. 04/28/2015, 12:58 PM

## 2015-04-28 NOTE — Progress Notes (Signed)
Dr Elpidio AnisSudini notified of pt complaints of itching and local pink rash on front torso

## 2015-04-28 NOTE — ED Provider Notes (Signed)
[facility] Emergency Department Provider Note  ____________________________________________  Time seen: 3:30 AM  I have reviewed the triage vital signs and the nursing notes.   HISTORY  Chief Complaint Osteomyelitis       HPI Jonathon York is a 39 y.o. male presents with concern of osteomyelitis of the third and fourth toes of the right foot. Patient admits to noticing wound with drainage on the right fourth toe as well as chills and nausea and vomiting. Patient states that these are symptoms that were consistent with when he had osteomyelitis before. In addition patient also admits to pelvic pressure. He states that this is consistent when he's had urinary tract infections in the past. Patient self catheter     History reviewed. No pertinent past medical history.  Patient Active Problem List   Diagnosis Date Noted  . Osteomyelitis (HCC) 04/28/2015   paraplegic  Past Surgical History  Procedure Laterality Date  . Toe amputation Right   . Irrigation and debridement buttocks      Current Outpatient Rx  Name  Route  Sig  Dispense  Refill  . oxybutynin (DITROPAN XL) 15 MG 24 hr tablet   Oral   Take 15 mg by mouth daily.            Allergies Review of patient's allergies indicates no known allergies.  No family history on file.  Social History Social History  Substance Use Topics  . Smoking status: Current Every Day Smoker  . Smokeless tobacco: None  . Alcohol Use: No    Review of Systems  Constitutional: Negative for fever. Positive for chills Eyes: Negative for visual changes. ENT: Negative for sore throat. Cardiovascular: Negative for chest pain. Respiratory: Negative for shortness of breath. Gastrointestinal: Negative for abdominal pain, vomiting and diarrhea. Positive pelvic discomfort Genitourinary: Negative for dysuria. Musculoskeletal: Negative for back pain. Skin: Negative for rash. Right toe pain Neurological:  Negative for headaches, focal weakness or numbness.   10-point ROS otherwise negative.  ____________________________________________   PHYSICAL EXAM:  VITAL SIGNS: ED Triage Vitals  Enc Vitals Group     BP 04/28/15 0318 120/78 mmHg     Pulse Rate 04/28/15 0318 99     Resp 04/28/15 0318 18     Temp 04/28/15 0318 98.2 F (36.8 C)     Temp Source 04/28/15 0318 Oral     SpO2 04/28/15 0318 98 %     Weight 04/28/15 0318 100 lb (45.36 kg)     Height 04/28/15 0318 5\' 8"  (1.727 m)     Head Cir --      Peak Flow --      Pain Score 04/28/15 0331 9     Pain Loc --      Pain Edu? --      Excl. in GC? --     Constitutional: Alert and oriented. Well appearing and in no distress. Eyes: Conjunctivae are normal. PERRL. Normal extraocular movements. ENT   Head: Normocephalic and atraumatic.   Nose: No congestion/rhinnorhea.   Mouth/Throat: Mucous membranes are moist.   Neck: No stridor. Hematological/Lymphatic/Immunilogical: No cervical lymphadenopathy. Cardiovascular: Normal rate, regular rhythm. Normal and symmetric distal pulses are present in all extremities. No murmurs, rubs, or gallops. Respiratory: Normal respiratory effort without tachypnea nor retractions. Breath sounds are clear and equal bilaterally. No wheezes/rales/rhonchi. Gastrointestinal: Soft and nontender. No distention. There is no CVA tenderness. Genitourinary: deferred Musculoskeletal: Nontender with normal range of motion in all extremities. No joint effusions.  No  lower extremity tenderness nor edema. Neurologic:  Normal speech and language. No gross focal neurologic deficits are appreciated. Speech is normal.  Skin:  Half inch by half inch ulcer noted lateral aspect of the third and fourth toe of the right foot Psychiatric: Mood and affect are normal. Speech and behavior are normal. Patient exhibits appropriate insight and judgment.  ____________________________________________    LABS (pertinent  positives/negatives)  Labs Reviewed  CBC - Abnormal; Notable for the following:    WBC 12.1 (*)    Hemoglobin 10.0 (*)    HCT 32.6 (*)    MCV 68.0 (*)    MCH 20.9 (*)    MCHC 30.8 (*)    RDW 20.5 (*)    All other components within normal limits  COMPREHENSIVE METABOLIC PANEL - Abnormal; Notable for the following:    Calcium 8.8 (*)    AST 11 (*)    ALT 11 (*)    Total Bilirubin 0.2 (*)    All other components within normal limits  URINALYSIS COMPLETEWITH MICROSCOPIC ([facility] ONLY) - Abnormal; Notable for the following:    Color, Urine YELLOW (*)    APPearance CLOUDY (*)    Protein, ur 30 (*)    Nitrite POSITIVE (*)    Leukocytes, UA 2+ (*)    Bacteria, UA MANY (*)    Squamous Epithelial / LPF 0-5 (*)    All other components within normal limits  SEDIMENTATION RATE       RADIOLOGY   DG Foot Complete Right (Final result) Result time: 04/28/15 03:51:12   Final result by Rad Results In Interface (04/28/15 03:51:12)   Narrative:   CLINICAL DATA: Possible toe osteomyelitis.  EXAM: RIGHT FOOT COMPLETE - 3+ VIEW  COMPARISON: RIGHT foot radiograph March 15, 2015  FINDINGS: Further bony reabsorption of the tuft of third distal phalanx. Further bony reabsorption of proximal fourth phalanx and, of the fourth distal interphalangeal joint. Status post fifth toe amputation. Osteopenia without acute fracture deformity or dislocation. No subcutaneous gas or radiopaque foreign bodies.  IMPRESSION: Further osteolysis of third and fourth toes concerning for osteomyelitis.   Electronically Signed By: Awilda Metro M.D. On: 04/28/2015 03:51             INITIAL IMPRESSION / ASSESSMENT AND PLAN / ED COURSE  Pertinent labs & imaging results that were available during my care of the patient were reviewed by me and considered in my medical decision making (see chart for details).  Patient seen by the vancomycin and Zosyn for osteomyelitis of the third  and fourth toe as well as urinary tract infection  ____________________________________________   FINAL CLINICAL IMPRESSION(S) / ED DIAGNOSES  Final diagnoses:  Acute osteomyelitis of right foot (HCC)  Acute cystitis without hematuria      Darci Current, MD 04/28/15 [phone]

## 2015-04-28 NOTE — H&P (Signed)
[facility] - [employer] at [facility]   PATIENT NAME: Jonathon York    MR#:  [id number]  DATE OF BIRTH:  09/25/1975  DATE OF ADMISSION:  04/28/2015  PRIMARY CARE PHYSICIAN: No primary care provider on file.   REQUESTING/REFERRING PHYSICIAN: Dr. Manson Passey  CHIEF COMPLAINT:   Chief Complaint  Patient presents with  . Osteomyelitis     Right great toe and fourth toe - wheelchair bound    HISTORY OF PRESENT ILLNESS:  Jonathon York  is a 39 y.o. male with a known history of paraplegia, bedbound, neurogenic bladder presents to the emergency room complaining of right foot ulcer. Patient has noticed weakness, nausea, chills which are similar to his presentation in 2015 for osteomyelitis of the same foot. At that time he had his fifth toe resected and sent home on Keflex. Wound cultures grew MSSA and Streptococcus G. Today he has elevated white count of 12,000. Right fourth toe ulcer with serosanguineous discharge. X-ray showing osteomyelitis and is being admitted to the hospitalist service. No recent antibiotic use. He lives alone. Is able to transfer himself from the bed to the wheelchair. Uses in and out catheter when necessary for neurogenic bladder.  PAST MEDICAL HISTORY:  History reviewed. No pertinent past medical history.  PAST SURGICAL HISTORY:   Past Surgical History  Procedure Laterality Date  . Toe amputation Right   . Irrigation and debridement buttocks      SOCIAL HISTORY:   Social History  Substance Use Topics  . Smoking status: Current Every Day Smoker  . Smokeless tobacco: Not on file  . Alcohol Use: No    FAMILY HISTORY:  No family history on file.  DRUG ALLERGIES:  No Known Allergies  REVIEW OF SYSTEMS:   Review of Systems  Constitutional: Positive for chills and malaise/fatigue. Negative for fever and weight loss.  HENT: Negative for hearing loss and nosebleeds.   Eyes: Negative for blurred vision, double vision and pain.   Respiratory: Negative for cough, hemoptysis, sputum production, shortness of breath and wheezing.   Cardiovascular: Negative for chest pain, palpitations, orthopnea and leg swelling.  Gastrointestinal: Positive for nausea. Negative for vomiting, abdominal pain, diarrhea and constipation.  Genitourinary: Negative for dysuria and hematuria.  Musculoskeletal: Negative for myalgias, back pain and falls.  Skin: Negative for rash.  Neurological: Positive for dizziness and weakness. Negative for tremors, sensory change, speech change, focal weakness, seizures and headaches.  Endo/Heme/Allergies: Does not bruise/bleed easily.  Psychiatric/Behavioral: Negative for depression and memory loss. The patient is not nervous/anxious.     MEDICATIONS AT HOME:   Prior to Admission medications   Medication Sig Start Date End Date Taking? Authorizing Provider  oxybutynin (DITROPAN XL) 15 MG 24 hr tablet Take 15 mg by mouth daily.    Yes Historical Provider, MD      VITAL SIGNS:  Blood pressure 123/57, pulse 87, temperature 98.2 F (36.8 C), temperature source Oral, resp. rate 16, height  (1.727 m), weight 45.36 kg (100 lb), SpO2 99 %.  PHYSICAL EXAMINATION:  Physical Exam  GENERAL:  39 y.o.-year-old patient lying in the bed with no acute distress. Thin. EYES: Pupils equal, round, reactive to light and accommodation. No scleral icterus. Extraocular muscles intact.  HEENT: Head atraumatic, normocephalic. Oropharynx and nasopharynx clear. No oropharyngeal erythema, moist oral mucosa  NECK:  Supple, no jugular venous distention. No thyroid enlargement, no tenderness.  LUNGS: Normal breath sounds bilaterally, no wheezing, rales, rhonchi. No use of accessory muscles of respiration.  CARDIOVASCULAR: S1, S2 normal. No murmurs, rubs, or gallops.  ABDOMEN: Soft, nontender, nondistended. Bowel sounds present. No organomegaly or mass.  EXTREMITIES: No pedal edema, cyanosis, or clubbing. + 2 pedal & radial  pulses b/l. Lower extremity muscle wasting  NEUROLOGIC: Paraplegia with decreased sensations in the lower extremity's. PSYCHIATRIC: The patient is alert and oriented x 3. Good affect.  SKIN: Right foot ulcer with serosanguineous discharge. Sacral decubitus ulcers.  LABORATORY PANEL:   CBC  Recent Labs Lab 04/28/15 0345  WBC 12.1*  HGB 10.0*  HCT 32.6*  PLT 386   ------------------------------------------------------------------------------------------------------------------  Chemistries   Recent Labs Lab 04/28/15 0345  NA 139  K 3.8  CL 105  CO2 28  GLUCOSE 88  BUN 13  CREATININE 0.64  CALCIUM 8.8*  AST 11*  ALT 11*  ALKPHOS 75  BILITOT 0.2*   ------------------------------------------------------------------------------------------------------------------  Cardiac Enzymes No results for input(s): TROPONINI in the last 168 hours. ------------------------------------------------------------------------------------------------------------------  RADIOLOGY:  Dg Foot Complete Right  04/28/2015  CLINICAL DATA:  Possible toe osteomyelitis. EXAM: RIGHT FOOT COMPLETE - 3+ VIEW COMPARISON:  RIGHT foot radiograph March 15, 2015 FINDINGS: Further bony reabsorption of the tuft of third distal phalanx. Further bony reabsorption of proximal fourth phalanx and, of the fourth distal interphalangeal joint. Status post fifth toe amputation. Osteopenia without acute fracture deformity or dislocation. No subcutaneous gas or radiopaque foreign bodies. IMPRESSION: Further osteolysis of third and fourth toes concerning for osteomyelitis. Electronically Signed   By: Awilda Metroourtnay  Bloomer M.D.   On: 04/28/2015 03:51     IMPRESSION AND PLAN:   * Osteomyelitis right foot Start IV vancomycin and Zosyn. Consult podiatry. Wound cx sent from ED  * Neurogenic bladder Continue Dtitropan. In and out cath PRN  * Anemia of chronic disease Monitor  * Sacral decubitus ulcers Wound care  consult  * DVT prophylaxis with Lovenox  All the records are reviewed and case discussed with ED provider. Management plans discussed with the patient, family and they are in agreement.  CODE STATUS: FULL  TOTAL TIME TAKING CARE OF THIS PATIENT: 45 minutes.    Milagros LollSudini, Erskine Steinfeldt R M.D on 04/28/2015 at 4:45 AM  Between 7am to 6pm - Pager - [phone]  After 6pm go to [email or web address] - password [email or web address]  [facility]  Office  [phone]  CC: Primary care physician; No primary care provider on file.   Note: This dictation was prepared with Dragon dictation along with smaller phrase technology. Any transcriptional errors that result from this process are unintentional.

## 2015-04-28 NOTE — Consult Note (Signed)
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   Ulceration to right fifth metatarsal, podiatry has been consulted, will defer.  Wound type:Chronic pressure injury Pressure Ulcer POA: Yes Measurement: Sacrum 6 cm x 4.5 cm x 0.3 cm  Left ischium 4 cm x 5 cm x 0.4 cm with undermining present circumferentially, extending 2 cm.  Wound bed:Both are 100% pale pink nongranulating Patient indicates that he uses a mixture of salt and vinegar moistened gauze to these wounds daily.  Wounds are clean and have no odor.  Drainage (amount, consistency, odor) Moderate serosanguinous drainage.  This area does stay moist at times from urine.  Periwound:Intact Dressing procedure/placement/frequency:Cleanse wounds to sacrum and left ischium with NS and pat gently dry.  Gently fill wound bed with ALGISITE calcium alginate dressing (in store room on unit).  Fill in the undermining to the left wound.  Cover with 4x4 gauze, ABD pad and tape.  Change daily and PRN soilage.  Will not follow at this time.  Please re-consult if needed.  Maple HudsonKaren Kloi Brodman RN BSN CWON Pager [phone]

## 2015-04-28 NOTE — ED Notes (Signed)
Wheelchair bound gentleman who presents for possible osteomyelitis in toes on right foot. Thinks he has it in his bones, has been having cold chills, nausea and vomiting. Appetite is decreased.

## 2015-04-29 ENCOUNTER — Encounter
Admission: EM | Disposition: A | Discharge status: Home / Self Care | Payer: Self-pay | Source: Home / Self Care | Attending: Internal Medicine

## 2015-04-29 ENCOUNTER — Inpatient Hospital Stay: Payer: Medicare Other | Admitting: Anesthesiology

## 2015-04-29 ENCOUNTER — Encounter: Payer: Self-pay | Admitting: Anesthesiology

## 2015-04-29 HISTORY — PX: AMPUTATION TOE: SHX6595

## 2015-04-29 LAB — CBC
HEMATOCRIT: 32.5 % — AB (ref 40.0–52.0)
Hemoglobin: 10 g/dL — ABNORMAL LOW (ref 13.0–18.0)
MCH: 20.9 pg — ABNORMAL LOW (ref 26.0–34.0)
MCHC: 30.9 g/dL — ABNORMAL LOW (ref 32.0–36.0)
MCV: 67.6 fL — ABNORMAL LOW (ref 80.0–100.0)
PLATELETS: 370 10*3/uL (ref 150–440)
RBC: 4.8 MIL/uL (ref 4.40–5.90)
RDW: 20.5 % — AB (ref 11.5–14.5)
WBC: 7.6 10*3/uL (ref 3.8–10.6)

## 2015-04-29 LAB — BASIC METABOLIC PANEL
Anion gap: 3 — ABNORMAL LOW (ref 5–15)
BUN: 10 mg/dL (ref 6–20)
CO2: 26 mmol/L (ref 22–32)
CREATININE: 0.51 mg/dL — AB (ref 0.61–1.24)
Calcium: 8.4 mg/dL — ABNORMAL LOW (ref 8.9–10.3)
Chloride: 111 mmol/L (ref 101–111)
Glucose, Bld: 95 mg/dL (ref 65–99)
POTASSIUM: 4.2 mmol/L (ref 3.5–5.1)
SODIUM: 140 mmol/L (ref 135–145)

## 2015-04-29 SURGERY — AMPUTATION, TOE
Anesthesia: General | Laterality: Right

## 2015-04-29 MED ORDER — SODIUM CHLORIDE 0.9 % IJ SOLN: 3.0000 mL | INTRAMUSCULAR | Status: DC | PRN

## 2015-04-29 MED ORDER — BUPIVACAINE HCL (PF) 0.5 % IJ SOLN
INTRAMUSCULAR | Status: AC
  Filled 2015-04-29: qty 30

## 2015-04-29 MED ORDER — SODIUM CHLORIDE 0.9 % IJ SOLN: 3.0000 mL | Freq: Two times a day (BID) | INTRAMUSCULAR | Status: DC

## 2015-04-29 MED ORDER — FENTANYL CITRATE (PF) 100 MCG/2ML IJ SOLN
INTRAMUSCULAR | Status: DC | PRN
  Administered 2015-04-29: 100 ug via INTRAVENOUS

## 2015-04-29 MED ORDER — BUPIVACAINE HCL 0.5 % IJ SOLN
INTRAMUSCULAR | Status: DC | PRN
  Administered 2015-04-29: 7 mL

## 2015-04-29 MED ORDER — PROPOFOL 500 MG/50ML IV EMUL
INTRAVENOUS | Status: DC | PRN
  Administered 2015-04-29: 20 ug/kg/min via INTRAVENOUS

## 2015-04-29 MED ORDER — LIDOCAINE HCL (PF) 1 % IJ SOLN
INTRAMUSCULAR | Status: AC
  Filled 2015-04-29: qty 30

## 2015-04-29 MED ORDER — PROMETHAZINE HCL 25 MG/ML IJ SOLN: 6.2500 mg | INTRAMUSCULAR | Status: DC | PRN

## 2015-04-29 MED ORDER — SODIUM CHLORIDE 0.9 % IV BOLUS (SEPSIS)
1000.0000 mL | Freq: Once | INTRAVENOUS | Status: AC
  Administered 2015-04-29: 1000 mL via INTRAVENOUS

## 2015-04-29 MED ORDER — MIDAZOLAM HCL 2 MG/2ML IJ SOLN
INTRAMUSCULAR | Status: DC | PRN
  Administered 2015-04-29: 2 mg via INTRAVENOUS

## 2015-04-29 MED ORDER — OXYCODONE HCL 5 MG PO TABS
5.0000 mg | ORAL_TABLET | ORAL | Status: DC | PRN
  Administered 2015-04-29 – 2015-04-30 (×2): 5 mg via ORAL
  Filled 2015-04-29 (×2): qty 1

## 2015-04-29 MED ORDER — LACTATED RINGERS IV SOLN
INTRAVENOUS | Status: DC | PRN
  Administered 2015-04-29: 08:00:00 via INTRAVENOUS

## 2015-04-29 MED ORDER — SODIUM CHLORIDE 0.9 % IV BOLUS (SEPSIS)
500.0000 mL | Freq: Once | INTRAVENOUS | Status: AC
  Administered 2015-04-29: 500 mL via INTRAVENOUS

## 2015-04-29 MED ORDER — FENTANYL CITRATE (PF) 100 MCG/2ML IJ SOLN: 25.0000 ug | INTRAMUSCULAR | Status: DC | PRN

## 2015-04-29 MED ORDER — NEOMYCIN-POLYMYXIN B GU 40-200000 IR SOLN
Status: AC
  Filled 2015-04-29: qty 2

## 2015-04-29 MED ORDER — SODIUM CHLORIDE 0.9 % IV SOLN: 250.0000 mL | INTRAVENOUS | Status: DC | PRN

## 2015-04-29 SURGICAL SUPPLY — 43 items
BANDAGE ACE 4X5 VEL STRL LF (GAUZE/BANDAGES/DRESSINGS) ×2 IMPLANT
BLADE MED AGGRESSIVE (BLADE) IMPLANT
BLADE OSC/SAGITTAL MD 5.5X18 (BLADE) IMPLANT
BLADE SURG 15 STRL LF DISP TIS (BLADE) ×2 IMPLANT
BLADE SURG 15 STRL SS (BLADE) ×2
BLADE SURG MINI STRL (BLADE) ×2 IMPLANT
BNDG COHESIVE 4X5 WHT NS (GAUZE/BANDAGES/DRESSINGS) ×2 IMPLANT
BNDG ESMARK 4X12 TAN STRL LF (GAUZE/BANDAGES/DRESSINGS) ×2 IMPLANT
BNDG GAUZE 4.5X4.1 6PLY STRL (MISCELLANEOUS) ×2 IMPLANT
CANISTER SUCT 1200ML W/VALVE (MISCELLANEOUS) ×2 IMPLANT
CUFF TOURN 18 STER (MISCELLANEOUS) IMPLANT
CUFF TOURN DUAL PL 12 NO SLV (MISCELLANEOUS) ×4 IMPLANT
DRAPE FLUOR MINI C-ARM 54X84 (DRAPES) IMPLANT
DURAPREP 26ML APPLICATOR (WOUND CARE) ×2 IMPLANT
GAUZE PETRO XEROFOAM 1X8 (MISCELLANEOUS) ×2 IMPLANT
GAUZE SPONGE 4X4 12PLY STRL (GAUZE/BANDAGES/DRESSINGS) ×2 IMPLANT
GAUZE STRETCH 2X75IN STRL (MISCELLANEOUS) ×2 IMPLANT
GAUZE XEROFORM 4X4 STRL (GAUZE/BANDAGES/DRESSINGS) ×2 IMPLANT
GLOVE BIO SURGEON STRL SZ7.5 (GLOVE) ×6 IMPLANT
GLOVE INDICATOR 8.0 STRL GRN (GLOVE) ×2 IMPLANT
GOWN STRL REUS W/ TWL LRG LVL3 (GOWN DISPOSABLE) ×2 IMPLANT
GOWN STRL REUS W/TWL LRG LVL3 (GOWN DISPOSABLE) ×2
HANDPIECE VERSAJET DEBRIDEMENT (MISCELLANEOUS) ×2 IMPLANT
KIT RM TURNOVER STRD PROC AR (KITS) ×2 IMPLANT
LABEL OR SOLS (LABEL) ×2 IMPLANT
NEEDLE FILTER BLUNT 18X 1/2SAF (NEEDLE) ×1
NEEDLE FILTER BLUNT 18X1 1/2 (NEEDLE) ×1 IMPLANT
NEEDLE HYPO 25X1 1.5 SAFETY (NEEDLE) ×4 IMPLANT
NS IRRIG 500ML POUR BTL (IV SOLUTION) ×2 IMPLANT
PACK EXTREMITY ARMC (MISCELLANEOUS) ×2 IMPLANT
PAD GROUND ADULT SPLIT (MISCELLANEOUS) ×2 IMPLANT
SOL .9 NS 3000ML IRR  AL (IV SOLUTION)
SOL .9 NS 3000ML IRR UROMATIC (IV SOLUTION) IMPLANT
SOL PREP PVP 2OZ (MISCELLANEOUS) ×2
SOLUTION PREP PVP 2OZ (MISCELLANEOUS) ×1 IMPLANT
STOCKINETTE STRL 6IN 960660 (GAUZE/BANDAGES/DRESSINGS) ×2 IMPLANT
STRIP CLOSURE SKIN 1/4X4 (GAUZE/BANDAGES/DRESSINGS) ×2 IMPLANT
SUT ETHILON 4-0 (SUTURE) ×2
SUT ETHILON 4-0 FS2 18XMFL BLK (SUTURE) ×2
SUT ETHILON 5-0 FS-2 18 BLK (SUTURE) ×2 IMPLANT
SUTURE ETHLN 4-0 FS2 18XMF BLK (SUTURE) ×2 IMPLANT
SWAB DUAL CULTURE TRANS RED ST (MISCELLANEOUS) ×2 IMPLANT
SYRINGE 10CC LL (SYRINGE) ×2 IMPLANT

## 2015-04-29 NOTE — Transfer of Care (Signed)
Immediate Anesthesia Transfer of Care Note  Patient: Jonathon York  Procedure(s) Performed: Procedure(s): AMPUTATION TOE (Right)  Patient Location: PACU  Anesthesia Type:MAC  Level of Consciousness: awake  Airway & Oxygen Therapy: Patient Spontanous Breathing  Post-op Assessment: Report given to RN  Post vital signs: Reviewed  Last Vitals:  Filed Vitals:   04/28/15 2005 04/29/15 0528  BP: 100/63 97/55  Pulse: 66 65  Temp: 36.7 C 36.9 C  Resp: 18 18    Complications: No apparent anesthesia complications

## 2015-04-29 NOTE — H&P (View-Only) (Signed)
Reason for Consult: Osteomyelitis right third and fourth toes Referring Physician: [facility]  Jonathon York is an 39 y.o. male.  HPI: The patient relates a history over the last week or 2 of a draining ulceration on his right fourth toe. He has been trying to care for this himself at home. Does relate a previous history of a similar problem on his fifth toe which required an amputation. Recently admitted through the emergency department for this as well as some other issues regarding an ulceration on his back and urinary tract infection  History reviewed. No pertinent past medical history.  Past Surgical History  Procedure Laterality Date  . Toe amputation Right   . Irrigation and debridement buttocks      History reviewed. No pertinent family history.  Social History:  reports that he has been smoking.  He does not have any smokeless tobacco history on file. He reports that he does not drink alcohol. His drug history is not on file.  Allergies: No Known Allergies  Medications:  Scheduled: . docusate sodium  100 mg Oral BID  . enoxaparin (LOVENOX) injection  40 mg Subcutaneous Q24H  . oxybutynin  15 mg Oral BID WC  . piperacillin-tazobactam (ZOSYN)  IV  3.375 g Intravenous 3 times per day  . vancomycin  750 mg Intravenous Q12H    Results for orders placed or performed during the hospital encounter of 04/28/15 (from the past 48 hour(s))  CBC     Status: Abnormal   Collection Time: 04/28/15  3:45 AM  Result Value Ref Range   WBC 12.1 (H) 3.8 - 10.6 K/uL   RBC 4.80 4.40 - 5.90 MIL/uL   Hemoglobin 10.0 (L) 13.0 - 18.0 g/dL   HCT 32.6 (L) 40.0 - 52.0 %   MCV 68.0 (L) 80.0 - 100.0 fL   MCH 20.9 (L) 26.0 - 34.0 pg   MCHC 30.8 (L) 32.0 - 36.0 g/dL   RDW 20.5 (H) 11.5 - 14.5 %   Platelets 386 150 - 440 K/uL  Comprehensive metabolic panel     Status: Abnormal   Collection Time: 04/28/15  3:45 AM  Result Value Ref Range   Sodium 139 135 - 145 mmol/L   Potassium 3.8 3.5 - 5.1 mmol/L   Chloride 105 101 - 111 mmol/L   CO2 28 22 - 32 mmol/L   Glucose, Bld 88 65 - 99 mg/dL   BUN 13 6 - 20 mg/dL   Creatinine, Ser 0.64 0.61 - 1.24 mg/dL   Calcium 8.8 (L) 8.9 - 10.3 mg/dL   Total Protein 7.1 6.5 - 8.1 g/dL   Albumin 3.6 3.5 - 5.0 g/dL   AST 11 (L) 15 - 41 U/L   ALT 11 (L) 17 - 63 U/L   Alkaline Phosphatase 75 38 - 126 U/L   Total Bilirubin 0.2 (L) 0.3 - 1.2 mg/dL   GFR calc non Af Amer >60 >60 mL/min   GFR calc Af Amer >60 >60 mL/min    Comment: (NOTE) The eGFR has been calculated using the CKD EPI equation. This calculation has not been validated in all clinical situations. eGFR's persistently <60 mL/min signify possible Chronic Kidney Disease.    Anion gap 6 5 - 15  Sedimentation rate     Status: Abnormal   Collection Time: 04/28/15  3:45 AM  Result Value Ref Range   Sed Rate 38 (H) 0 - 15 mm/hr  Urinalysis complete, with microscopic ([facility] only)  Status: Abnormal   Collection Time: 04/28/15  4:24 AM  Result Value Ref Range   Color, Urine YELLOW (A) YELLOW   APPearance CLOUDY (A) CLEAR   Glucose, UA NEGATIVE NEGATIVE mg/dL   Bilirubin Urine NEGATIVE NEGATIVE   Ketones, ur NEGATIVE NEGATIVE mg/dL   Specific Gravity, Urine 1.024 1.005 - 1.030   Hgb urine dipstick NEGATIVE NEGATIVE   pH 7.0 5.0 - 8.0   Protein, ur 30 (A) NEGATIVE mg/dL   Nitrite POSITIVE (A) NEGATIVE   Leukocytes, UA 2+ (A) NEGATIVE   RBC / HPF 0-5 0 - 5 RBC/hpf   WBC, UA TOO NUMEROUS TO COUNT 0 - 5 WBC/hpf   Bacteria, UA MANY (A) NONE SEEN   Squamous Epithelial / LPF 0-5 (A) NONE SEEN   Mucous PRESENT     Dg Foot Complete Right  04/28/2015  CLINICAL DATA:  Possible toe osteomyelitis. EXAM: RIGHT FOOT COMPLETE - 3+ VIEW COMPARISON:  RIGHT foot radiograph March 15, 2015 FINDINGS: Further bony reabsorption of the tuft of third distal phalanx. Further bony reabsorption of proximal fourth phalanx and, of the fourth distal interphalangeal joint. Status post  fifth toe amputation. Osteopenia without acute fracture deformity or dislocation. No subcutaneous gas or radiopaque foreign bodies. IMPRESSION: Further osteolysis of third and fourth toes concerning for osteomyelitis. Electronically Signed   By: Elon Alas M.D.   On: 04/28/2015 03:51    Review of Systems  Constitutional: Positive for fever and chills.  HENT: Negative.   Eyes: Negative.   Respiratory: Negative.   Cardiovascular: Negative.   Gastrointestinal: Positive for nausea.  Genitourinary: Negative.   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   Endo/Heme/Allergies: Negative.   Psychiatric/Behavioral: Negative.    Blood pressure 102/63, pulse 86, temperature 97.8 F (36.6 C), temperature source Oral, resp. rate 18, height _0  (1.727 m), weight 45.36 kg (100 lb), SpO2 99 %. Physical Exam  Cardiovascular:  DP and PT pulses are fully palpable. Capillary filling time intact  Musculoskeletal:  Contractures in the lower extremities. Hammertoe contractures noted as well. Diminished muscle mass and tone  Neurological:  Epicritic sensations are grossly intact with some mild impairment  Skin:  The skin was then dry and somewhat atrophic. Full-thickness ulceration with some drainage along the lateral aspect of the right fourth toe. Some hemorrhagic hyperkeratosis at the distal tips of the second and third toes but no active ulcerations.    Assessment/Plan: Assessment: Osteomyelitis right fourth toe  Plan: Discussed with the patient that the fourth toe is clearly infected. At this point the third does not clearly appear to be osteomyelitis as was suggested by the x-rays. Discussed with the patient amputation of the fourth toe and we will plan on performing this tomorrow morning. Discussed the procedure. Questions were invited and  answered. Light bandage applied to the right fourth toe since it has been draining on the bed sheets. Consent for amputation of the right fourth toe. Nothing by mouth after midnight. Plan for surgery tomorrow morning  Jonathon Lagrange W. 04/28/2015, 12:58 PM

## 2015-04-29 NOTE — Anesthesia Postprocedure Evaluation (Signed)
Anesthesia Post Note  Patient: Jonathon York  Procedure(s) Performed: Procedure(s) (LRB): AMPUTATION TOE (Right)  Patient location during evaluation: PACU Anesthesia Type: General Level of consciousness: awake and alert Pain management: pain level controlled Vital Signs Assessment: post-procedure vital signs reviewed and stable Respiratory status: spontaneous breathing, nonlabored ventilation, respiratory function stable and patient connected to nasal cannula oxygen Cardiovascular status: blood pressure returned to baseline and stable Postop Assessment: no signs of nausea or vomiting Anesthetic complications: no    Last Vitals:  Filed Vitals:   04/29/15 1045 04/29/15 1531  BP: 91/61 90/56  Pulse: 79 78  Temp: 36.6 C 36.8 C  Resp:  18    Last Pain:  Filed Vitals:   04/29/15 1531  PainSc: 6                  Lenard SimmerAndrew Farley Crooker

## 2015-04-29 NOTE — Anesthesia Preprocedure Evaluation (Signed)
Anesthesia Evaluation  Patient identified by MRN, date of birth, ID band Patient awake    Reviewed: Allergy & Precautions, H&P , NPO status , Patient's Chart, lab work & pertinent test results, reviewed documented beta blocker date and time   History of Anesthesia Complications Negative for: history of anesthetic complications  Airway Mallampati: I  TM Distance: >3 FB Neck ROM: full    Dental no notable dental hx. (+) Teeth Intact   Pulmonary neg pulmonary ROS, Current Smoker,    Pulmonary exam normal breath sounds clear to auscultation       Cardiovascular Exercise Tolerance: Good negative cardio ROS Normal cardiovascular exam Rhythm:regular Rate:Normal     Neuro/Psych neg Seizures Paraparesis of bilateral lower extremities negative psych ROS   GI/Hepatic negative GI ROS, Neg liver ROS,   Endo/Other  negative endocrine ROS  Renal/GU negative Renal ROS  negative genitourinary   Musculoskeletal   Abdominal   Peds  Hematology negative hematology ROS (+)   Anesthesia Other Findings Past Medical History:   Paraparesis of both lower limbs (HCC)           02/21/00     Reproductive/Obstetrics negative OB ROS                             Anesthesia Physical Anesthesia Plan  ASA: II  Anesthesia Plan: General   Post-op Pain Management:    Induction:   Airway Management Planned:   Additional Equipment:   Intra-op Plan:   Post-operative Plan:   Informed Consent: I have reviewed the patients History and Physical, chart, labs and discussed the procedure including the risks, benefits and alternatives for the proposed anesthesia with the patient or authorized representative who has indicated his/her understanding and acceptance.   Dental Advisory Given  Plan Discussed with: Anesthesiologist, CRNA and Surgeon  Anesthesia Plan Comments:         Anesthesia Quick Evaluation

## 2015-04-29 NOTE — Progress Notes (Signed)
[facility] [place] at [facility]   PATIENT NAME: Jonathon York    MR#:  [id number]  DATE OF BIRTH:  [birth year]  SUBJECTIVE:  CHIEF COMPLAINT:   Chief Complaint  Patient presents with  . Osteomyelitis     Right great toe and fourth toe - wheelchair bound   patient is a 38 year old Caucasian male with asthma, history significant for history of paraplegia, neurogenic bladder with intermittent self catheterizations who presents to the hospital with right foot ulceration and pain in the foot. X-ray in emergency room revealed osteomyelitis of third and fourth toes, patient is admitted to the hospital for further evaluation and treatment. Podiatry saw patient in consultation and felt that he had fourth toe infection but not third toe. Fourth toe amputation was performed by Dr. Alberteen Spindle 31th of December 2016. Patient is on vancomycin and Azactam. He developed itching, felt due to hydrocodone/Vicodin, now due to antibiotics . Feels satisfactory today. Admits of right foot pain after operation. White blood cell count is back to normal, patient is afebrile. Wound cultures are pending. Dr. Alberteen Spindle feels that Augmentin would be sufficient for 2 weeks after discharge, no need for IV antibiotics. Pressure ulcer was not seen by wound care yesterday, nursing staff is applying dressings per staging   Review of Systems  Constitutional: Positive for malaise/fatigue. Negative for fever, chills and weight loss.  HENT: Negative for congestion.   Eyes: Negative for blurred vision and double vision.  Respiratory: Negative for cough, sputum production, shortness of breath and wheezing.   Cardiovascular: Negative for chest pain, palpitations, orthopnea, leg swelling and PND.  Gastrointestinal: Negative for nausea, vomiting, abdominal pain, diarrhea, constipation and blood in stool.  Genitourinary: Positive for dysuria. Negative for urgency, frequency and hematuria.  Musculoskeletal: Positive for  joint pain. Negative for falls.  Neurological: Negative for dizziness, tremors, focal weakness and headaches.  Endo/Heme/Allergies: Does not bruise/bleed easily.  Psychiatric/Behavioral: Negative for depression. The patient does not have insomnia.     VITAL SIGNS: Blood pressure 91/61, pulse 79, temperature 97.9 F (36.6 C), temperature source Oral, resp. rate 18, height  (1.727 m), weight 41.549 kg (91 lb 9.6 oz), SpO2 100 %.  PHYSICAL EXAMINATION:   GENERAL:  39 y.o.-year-old patient lying in the bed with no acute distress.  EYES: Pupils equal, round, reactive to light and accommodation. No scleral icterus. Extraocular muscles intact.  HEENT: Head atraumatic, normocephalic. Oropharynx and nasopharynx clear.  NECK:  Supple, no jugular venous distention. No thyroid enlargement, no tenderness.  LUNGS: Normal breath sounds bilaterally, no wheezing, rales,rhonchi or crepitation. No use of accessory muscles of respiration.  CARDIOVASCULAR: S1, S2 normal. No murmurs, rubs, or gallops.  ABDOMEN: Soft, nontender, nondistended. Bowel sounds present. No organomegaly or mass.  EXTREMITIES: No pedal edema, cyanosis, or clubbing.  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 Right foot is dressed  NEUROLOGIC: Cranial nerves II through XII are intact. Muscle strength 5/5 in all extremities. Sensation intact. Gait not checked.  PSYCHIATRIC: The patient is alert and oriented x 3.  SKIN: No obvious rash, lesion, or ulcer.   ORDERS/RESULTS REVIEWED:   CBC  Recent Labs Lab 04/28/15 0345 04/29/15 0323  WBC 12.1* 7.6  HGB 10.0* 10.0*  HCT 32.6* 32.5*  PLT 386 370  MCV 68.0* 67.6*  MCH 20.9* 20.9*  MCHC 30.8* 30.9*  RDW 20.5* 20.5*   ------------------------------------------------------------------------------------------------------------------  Chemistries   Recent Labs Lab 04/28/15 0345 04/29/15 0323  NA 139 140  K 3.8 4.2  CL  105 111  CO2 28 26  GLUCOSE 88 95  BUN 13 10  CREATININE  0.64 0.51*  CALCIUM 8.8* 8.4*  AST 11*  --   ALT 11*  --   ALKPHOS 75  --   BILITOT 0.2*  --    ------------------------------------------------------------------------------------------------------------------ estimated creatinine clearance is 72.8 mL/min (by C-G formula based on Cr of 0.51). ------------------------------------------------------------------------------------------------------------------ No results for input(s): TSH, T4TOTAL, T3FREE, THYROIDAB in the last 72 hours.  Invalid input(s): FREET3  Cardiac Enzymes No results for input(s): CKMB, TROPONINI, MYOGLOBIN in the last 168 hours.  Invalid input(s): CK ------------------------------------------------------------------------------------------------------------------ Invalid input(s): POCBNP ---------------------------------------------------------------------------------------------------------------  RADIOLOGY: Dg Foot Complete Right  04/28/2015  CLINICAL DATA:  Possible toe osteomyelitis. EXAM: RIGHT FOOT COMPLETE - 3+ VIEW COMPARISON:  RIGHT foot radiograph March 15, 2015 FINDINGS: Further bony reabsorption of the tuft of third distal phalanx. Further bony reabsorption of proximal fourth phalanx and, of the fourth distal interphalangeal joint. Status post fifth toe amputation. Osteopenia without acute fracture deformity or dislocation. No subcutaneous gas or radiopaque foreign bodies. IMPRESSION: Further osteolysis of third and fourth toes concerning for osteomyelitis. Electronically Signed   By: Awilda Metroourtnay  Bloomer M.D.   On: 04/28/2015 03:51    EKG:  Orders placed or performed in visit on 12/24/13  . EKG 12-Lead    ASSESSMENT AND PLAN:  Active Problems:   Osteomyelitis (HCC)   Malnutrition of moderate degree 1. Right fourth toe osteomyelitis, that is post right fourth toe amputation by Dr. Alberteen Spindleline  04/29/2015,  appreciate podiatry consultation, patient will continue broad spectrum intravenous antibiotic  therapy while in the hospital, then changed to Augmentin upon discharge for 2 week therapy, unless resistant bacteria is growing in wound cultures 2. Neurogenic bladder, improved with addition of Pyridium to help with spasms, continue Ditropan. Now with Foley catheter placement  3. Moderate degree calorie protein malnutrition, dietary consultation is appreciated, supportive care 4. Leukocytosis, resolved with therapy 5. Itching , possibly related to medications , Benadryl , to be continued, change her Vicodin to oxycodone  6.  Pyuria, sterile, urine cultures have no growth  7. Pressure ulcer, patient will need to follow up with wound care as outpatient versus wound care visit at home   Management plans discussed with the patient, family and they are in agreement.   DRUG ALLERGIES: No Known Allergies  CODE STATUS:     Code Status Orders        Start     Ordered   04/28/15 0437  Full code   Continuous     04/28/15 0438      TOTAL TIME TAKING CARE OF THIS PATIENT: 35 minutes.    all questions were answered  Chaela Branscum M.D on 04/29/2015 at 2:23 PM  Between 7am to 6pm - Pager - [phone]  After 6pm go to [email or web address] - password [email or web address] [facility]  Office  [phone]  CC: Primary care physician; No primary care provider on file.

## 2015-04-29 NOTE — Interval H&P Note (Signed)
History and Physical Interval Note:  04/29/2015 8:04 AM  Jonathon York  has presented today for surgery, with the diagnosis of t  The various methods of treatment have been discussed with the patient and family. After consideration of risks, benefits and other options for treatment, the patient has consented to  Procedure(s): AMPUTATION TOE (Right) as a surgical intervention .  The patient's history has been reviewed, patient examined, no change in status, stable for surgery.  I have reviewed the patient's chart and labs.  Questions were answered to the patient's satisfaction.     Koleen Celia W.

## 2015-04-29 NOTE — Progress Notes (Signed)
Pts BP 86/38, MD Kalesetti notified. Orders given for 1 L NS fluid bolus. Will continue to monitor.

## 2015-04-29 NOTE — Op Note (Signed)
Date of operation: 04/29/2015.  Surgeon: Ricci Barkerodd W Fillmore Bynum DPM.  Preoperative diagnosis: Osteomyelitis right fourth toe.  Postoperative diagnosis: Same.  Procedure: Amputation right fourth toe.  Anesthesia: Local Mac.  Hemostasis: Pneumatic tourniquet right ankle 250 mmHg.  Estimated blood loss: Less than 5 cc.  Pathology: Right fourth toe.  Cultures: Bone cultures right fourth toe proximal phalanx.  Complications: None apparent.  Operative indications: This is a 39 year old male with a history of paraplegia and recent development of ulceration on his right foot toe area x-rays showed bony destruction consistent with osteomyelitis and decision was made for amputation of the right fourth toe.  Operative procedure: Patient was taken to the operating room and placed on the table in the supine position. Satisfactory sedation the right foot was anesthetized with 7 cc of 0.5% bupivacaine plain around the fourth metatarsal. A pneumatic tourniquet was applied at the level of the right ankle and the foot was prepped and draped in the usual sterile fashion. Exsanguinated and the tourniquet inflated to 250 mmHg. Attention was then directed to the distal aspect of the right foot where an elliptical incision was made from dorsal to plantar around the medial and lateral base of the fourth toe. The incision was carried sharply down to the level of bone and periosteal dissection carried back to the level of the metatarsophalangeal joint where the toe was disarticulated and removed in toto. Good healthy bleeding tissues present. Wound was then flushed with copious amounts of sterile saline and closed using 4-0 nylon vertical mattress and simple interrupted sutures. Xeroform and sterile bandage applied. Kerlix and an Ace wrap applied. The tourniquet was released and blood flow noted to return immediately to the right foot. Patient tolerated the procedure and anesthesia well and was transported to the PACU with  vital signs stable and in good condition.

## 2015-04-30 ENCOUNTER — Encounter: Payer: Self-pay | Admitting: Podiatry

## 2015-04-30 DIAGNOSIS — R8281 Pyuria: Secondary | ICD-10-CM

## 2015-04-30 DIAGNOSIS — S98131A Complete traumatic amputation of one right lesser toe, initial encounter: Secondary | ICD-10-CM

## 2015-04-30 DIAGNOSIS — G8220 Paraplegia, unspecified: Secondary | ICD-10-CM

## 2015-04-30 DIAGNOSIS — N319 Neuromuscular dysfunction of bladder, unspecified: Secondary | ICD-10-CM

## 2015-04-30 DIAGNOSIS — L8993 Pressure ulcer of unspecified site, stage 3: Secondary | ICD-10-CM | POA: Insufficient documentation

## 2015-04-30 LAB — URINE CULTURE
Culture: NO GROWTH
Special Requests: NORMAL

## 2015-04-30 LAB — VANCOMYCIN, TROUGH: Vancomycin Tr: 9 ug/mL — ABNORMAL LOW (ref 10–20)

## 2015-04-30 MED ORDER — DOCUSATE SODIUM 100 MG PO CAPS: 100.0000 mg | ORAL_CAPSULE | Freq: Two times a day (BID) | ORAL | Status: DC

## 2015-04-30 MED ORDER — AMOXICILLIN-POT CLAVULANATE 875-125 MG PO TABS: 1.0000 | ORAL_TABLET | Freq: Two times a day (BID) | ORAL | Status: DC

## 2015-04-30 MED ORDER — DIPHENHYDRAMINE HCL 25 MG PO CAPS: 25.0000 mg | ORAL_CAPSULE | ORAL | Status: DC | PRN

## 2015-04-30 MED ORDER — BISACODYL 5 MG PO TBEC: 5.0000 mg | DELAYED_RELEASE_TABLET | Freq: Every day | ORAL | Status: DC | PRN

## 2015-04-30 MED ORDER — VANCOMYCIN HCL IN DEXTROSE 750-5 MG/150ML-% IV SOLN
750.0000 mg | Freq: Three times a day (TID) | INTRAVENOUS | Status: DC
  Filled 2015-04-30 (×2): qty 150

## 2015-04-30 MED ORDER — OXYCODONE HCL 5 MG PO TABS: 5.0000 mg | ORAL_TABLET | ORAL | Status: DC | PRN

## 2015-04-30 NOTE — Care Management Note (Addendum)
Case Management Note  Patient Details  Name: Jonathon York MRN: [id number] Date of Birth: Feb 28, 1976  Subjective/Objective:      A referral was faxed to [facility] requesting RN home health services. Note to home health nurse to call surgeon Dr Tawanna Coolerodd Cline's office if problems or questions about surgical wound occur.               Action/Plan:   Expected Discharge Date:                  Expected Discharge Plan:     In-House Referral:     Discharge planning Services     Post Acute Care Choice:    Choice offered to:     DME Arranged:    DME Agency:     HH Arranged:    HH Agency:     Status of Service:     Medicare Important Message Given:  Yes Date Medicare IM Given:    Medicare IM give by:    Date Additional Medicare IM Given:    Additional Medicare Important Message give by:     If discussed at Long Length of Stay Meetings, dates discussed:    Additional Comments:  Rabab Currington A, RN 04/30/2015, 10:04 AM

## 2015-04-30 NOTE — Progress Notes (Signed)
Completed discharge paperwork with patient, no complaints or questions.  Completed dressing change to sacrum prior to leaving.  Dr. Winona LegatoVaickute reprinted antibiotic prescription as was electronically sent to incorrect pharmacy.  Patient discharged home and patient drove self to hospital.

## 2015-04-30 NOTE — Progress Notes (Signed)
ANTIBIOTIC CONSULT NOTE - INITIAL  Pharmacy Consult for vancomycin and Zosyn dosing Indication: osteomyelitis/wound infection  No Known Allergies  Patient Measurements: Height: 5\' 8"  (172.7 cm) Weight: 91 lb 9.6 oz (41.549 kg) IBW/kg (Calculated) : 68.4 Adjusted Body Weight: 45.4kg  Vital Signs: Temp: 98.2 F (36.8 C) (12/31 1938) Temp Source: Oral (12/31 1938) BP: 95/57 mmHg (12/31 1938) Pulse Rate: 78 (12/31 1938) Intake/Output from previous day: 12/31 0701 - 01/01 0700 In: 1403 [I.V.:403; IV Piggyback:800] Out: 1430 [Urine:1430] Intake/Output from this shift: Total I/O In: 803 [I.V.:3; IV Piggyback:800] Out: 800 [Urine:800]  Labs:  Recent Labs  04/28/15 0345 04/29/15 0323  WBC 12.1* 7.6  HGB 10.0* 10.0*  PLT 386 370  CREATININE 0.64 0.51*   Estimated Creatinine Clearance: 72.8 mL/min (by C-G formula based on Cr of 0.51).  Recent Labs  04/30/15 0407  VANCOTROUGH 9*     Microbiology: Recent Results (from the past 720 hour(York))  Urine culture     Status: None (Preliminary result)   Collection Time: 04/28/15  4:24 PM  Result Value Ref Range Status   Specimen Description URINE, CATHETERIZED  Final   Special Requests Normal  Final   Culture NO GROWTH < 24 HOURS  Final   Report Status PENDING  Incomplete  Wound culture     Status: None (Preliminary result)   Collection Time: 04/29/15  8:43 AM  Result Value Ref Range Status   Specimen Description TOE  Final   Special Requests NONE  Final   Gram Stain RARE WBC SEEN RARE GRAM POSITIVE COCCI   Final   Culture PENDING  Incomplete   Report Status PENDING  Incomplete    Medical History: Past Medical History  Diagnosis Date  . Paraparesis of both lower limbs (HCC) 02/21/00    Medications:   Assessment: Wound cx pending R foot imaging: osteomyelitis 3rd and 4th toe  Goal of Therapy:  Vancomycin trough level 15-20 mcg/ml  Plan:  TBW 45.4kg  IBW 68.4kg  DW 45.4kg  Vd 32L kei 0.071 hr-1  t1/2 10  hours Vancomycin 750 mg IV q 12 hours ordered with stacked dosing. Level before 5th dose.  04/30/15 04:00 vanc level 9. Change to 750 mg IV q 8 hours. Level before 4th new dose.[name].  Zosyn 3.375 grams q 8 hours ordered.    Jonathon York 04/30/2015,5:35 AM

## 2015-04-30 NOTE — Discharge Summary (Signed)
[facility] - [place] [facility]   PATIENT NAME: Jonathon York    MR#:  [id number]  DATE OF BIRTH:  07-24-1975  DATE OF ADMISSION:  04/28/2015 ADMITTING PHYSICIAN: Milagros Loll, MD  DATE OF DISCHARGE: 04/30/2015 10:39 AM  PRIMARY CARE PHYSICIAN: No primary care provider on file.     ADMISSION DIAGNOSIS:  Infection [B99.9] Acute osteomyelitis of right foot (HCC) [M86.171] Acute cystitis without hematuria [N30.00]  DISCHARGE DIAGNOSIS:  Active Problems:   Toe osteomyelitis, right (HCC)   Amputation of fourth toe, right, traumatic (HCC)   Malnutrition of moderate degree   Pressure ulcer stage III (HCC)   Sterile pyuria   Paraplegia (HCC)   Neurogenic bladder   SECONDARY DIAGNOSIS:   Past Medical History  Diagnosis Date  . Paraparesis of both lower limbs (HCC) 02/21/00    .pro HOSPITAL COURSE:   The patient is a 40 year old Caucasian male with past medical history significant for history of paraplegia, neurogenic bladder, bedbound with sacral and buttock area pressure ulcers who presents to the hospital with complaints of weakness, nausea, chills and the right foot ulcer. Patient was admitted to emergency room and underwent an x-ray of his foot, concerning for right third and fourth toe osteomyelitis . Patient was seen by podiatrist iand felt that he had only right fourth toe osteomyelitis, but not third, amputation was suggested and it was performed on 04/29/2015. Postprocedure, patient was followed by podiatrist and recommended follow-up as outpatient. Pain control was adequate and it was felt that patient is stable to be discharged home. On presentation to the hospital patient as well as cell count was elevated to 12.1 thousand, and he had urinalysis with pyuria, concerning for urinary tract infection. Urine culture was taken and it showed no growth but at that point. Patient was managed on broad-spectrum antibiotic therapy for osteomyelitis.  Anaerobic wound culture was taken on 04/29/2015. However, results are not yet available. Advised discussed patient's case with Dr. Alberteen Spindle, podiatrist, who felt that Augmentin therapy would be sufficient for 2 weeks, she was prescribed for patient upon discharge Discussion by the problem 1. Right fourth toe osteomyelitis, status post right fourth toe amputation by Dr. Alberteen Spindle 04/29/2015, appreciate podiatry input, patient will continue Augmentin  for 2 more weeks , follow-up with podiatry as outpatient for further recommendations and wound care 2. Neurogenic bladder, improved with addition of Pyridium to help with spasms, continue Ditropan. Discontinue Foley catheter upon discharge as patient is to continue self catheterizations intermittently at home  3. Moderate degree calorie protein malnutrition, dietary consultation is appreciated, supportive care, discussion this patient today about need to gain some weight. All questions were answered, time spent approximately 15 minutes. On discussions 4. Leukocytosis, resolved with therapy 5. Itching , related to medications, resolved on Benadryl , suspected Vicodin related, patient is being however, discharged on oxycodone when necessary 6. Pyuria, sterile, urine cultures have no growth , however, taken when patient was on antibiotic therapy already. Pyuria, very likely is related to self-catheterization 7. Multiple sacral buttock area Pressure ulcers, patient will be seen by home health nurse, not infected  DISCHARGE CONDITIONS:   Stable  CONSULTS OBTAINED:  Treatment Team:  Linus Galas, MD  DRUG ALLERGIES:  No Known Allergies  DISCHARGE MEDICATIONS:   Discharge Medication List as of 04/30/2015  9:37 AM    START taking these medications   Details  bisacodyl (DULCOLAX) 5 MG EC tablet Take 1 tablet (5 mg total) by mouth daily as needed for moderate  constipation., Starting 04/30/2015, Until Discontinued, Normal    diphenhydrAMINE (BENADRYL) 25 mg  capsule Take 1 capsule (25 mg total) by mouth every 4 (four) hours as needed for itching or allergies., Starting 04/30/2015, Until Discontinued, Normal    docusate sodium (COLACE) 100 MG capsule Take 1 capsule (100 mg total) by mouth 2 (two) times daily., Starting 04/30/2015, Until Discontinued, Normal    oxyCODONE (OXY IR/ROXICODONE) 5 MG immediate release tablet Take 1 tablet (5 mg total) by mouth every 4 (four) hours as needed for moderate pain or severe pain., Starting 04/30/2015, Until Discontinued, Print    amoxicillin-clavulanate (AUGMENTIN) 875-125 MG tablet Take 1 tablet by mouth 2 (two) times daily., Starting 04/30/2015, Until Discontinued, Normal      CONTINUE these medications which have NOT CHANGED   Details  oxybutynin (DITROPAN XL) 15 MG 24 hr tablet Take 15 mg by mouth daily. , Until Discontinued, Historical Med         DISCHARGE INSTRUCTIONS:    Patient is to follow-up with podiatrist and primary care physician  If you experience worsening of your admission symptoms, develop shortness of breath, life threatening emergency, suicidal or homicidal thoughts you must seek medical attention immediately by calling 911 or calling your MD immediately  if symptoms less severe.  You Must read complete instructions/literature along with all the possible adverse reactions/side effects for all the Medicines you take and that have been prescribed to you. Take any new Medicines after you have completely understood and accept all the possible adverse reactions/side effects.   Please note  You were cared for by a hospitalist during your hospital stay. If you have any questions about your discharge medications or the care you received while you were in the hospital after you are discharged, you can call the unit and asked to speak with the hospitalist on call if the hospitalist that took care of you is not available. Once you are discharged, your primary care physician will handle any further  medical issues. Please note that NO REFILLS for any discharge medications will be authorized once you are discharged, as it is imperative that you return to your primary care physician (or establish a relationship with a primary care physician if you do not have one) for your aftercare needs so that they can reassess your need for medications and monitor your lab values.    Today   CHIEF COMPLAINT:   Chief Complaint  Patient presents with  . Osteomyelitis     Right great toe and fourth toe - wheelchair bound    HISTORY OF PRESENT ILLNESS:  Jonathon York  is a 40 y.o. male with a known history of paraplegia, neurogenic bladder, bedbound with sacral and buttock area pressure ulcers who presents to the hospital with complaints of weakness, nausea, chills and the right foot ulcer. Patient was admitted to emergency room and underwent an x-ray of his foot, concerning for right third and fourth toe osteomyelitis . Patient was seen by podiatrist iand felt that he had only right fourth toe osteomyelitis, but not third, amputation was suggested and it was performed on 04/29/2015. Postprocedure, patient was followed by podiatrist and recommended follow-up as outpatient. Pain control was adequate and it was felt that patient is stable to be discharged home. On presentation to the hospital patient as well as cell count was elevated to 12.1 thousand, and he had urinalysis with pyuria, concerning for urinary tract infection. Urine culture was taken and it showed no growth but at that point.  Patient was managed on broad-spectrum antibiotic therapy for osteomyelitis. Anaerobic wound culture was taken on 04/29/2015. However, results are not yet available. Advised discussed patient's case with Dr. Alberteen Spindle, podiatrist, who felt that Augmentin therapy would be sufficient for 2 weeks, she was prescribed for patient upon discharge Discussion by the problem 1. Right fourth toe osteomyelitis, status post right fourth toe  amputation by Dr. Alberteen Spindle 04/29/2015, appreciate podiatry input, patient will continue Augmentin  for 2 more weeks , follow-up with podiatry as outpatient for further recommendations and wound care 2. Neurogenic bladder, improved with addition of Pyridium to help with spasms, continue Ditropan. Discontinue Foley catheter upon discharge as patient is to continue self catheterizations intermittently at home  3. Moderate degree calorie protein malnutrition, dietary consultation is appreciated, supportive care, discussion this patient today about need to gain some weight. All questions were answered, time spent approximately 15 minutes. On discussions 4. Leukocytosis, resolved with therapy 5. Itching , related to medications, resolved on Benadryl , suspected Vicodin related, patient is being however, discharged on oxycodone when necessary 6. Pyuria, sterile, urine cultures have no growth , however, taken when patient was on antibiotic therapy already. Pyuria, very likely is related to self-catheterization 7. Multiple sacral buttock area Pressure ulcers, patient will be seen by home health nurse, not infected   VITAL SIGNS:  Blood pressure 107/66, pulse 91, temperature 98.2 F (36.8 C), temperature source Oral, resp. rate 18, height 5\' 8"  (1.727 m), weight 41.549 kg (91 lb 9.6 oz), SpO2 100 %.  I/O:   Intake/Output Summary (Last 24 hours) at 04/30/15 1354 Last data filed at 04/30/15 0900  Gross per 24 hour  Intake   1243 ml  Output   1575 ml  Net   -332 ml    PHYSICAL EXAMINATION:  GENERAL:  40 y.o.-year-old patient lying in the bed with no acute distress.  EYES: Pupils equal, round, reactive to light and accommodation. No scleral icterus. Extraocular muscles intact.  HEENT: Head atraumatic, normocephalic. Oropharynx and nasopharynx clear.  NECK:  Supple, no jugular venous distention. No thyroid enlargement, no tenderness.  LUNGS: Normal breath sounds bilaterally, no wheezing, rales,rhonchi  or crepitation. No use of accessory muscles of respiration.  CARDIOVASCULAR: S1, S2 normal. No murmurs, rubs, or gallops.  ABDOMEN: Soft, non-tender, non-distended. Bowel sounds present. No organomegaly or mass.  EXTREMITIES: No pedal edema, cyanosis, or clubbing.  NEUROLOGIC: Cranial nerves II through XII are intact. Muscle strength 5/5 in all extremities. Sensation intact. Gait not checked.  PSYCHIATRIC: The patient is alert and oriented x 3.  SKIN: No obvious rash, lesion, or ulcer.   DATA REVIEW:   CBC  Recent Labs Lab 04/29/15 0323  WBC 7.6  HGB 10.0*  HCT 32.5*  PLT 370    Chemistries   Recent Labs Lab 04/28/15 0345 04/29/15 0323  NA 139 140  K 3.8 4.2  CL 105 111  CO2 28 26  GLUCOSE 88 95  BUN 13 10  CREATININE 0.64 0.51*  CALCIUM 8.8* 8.4*  AST 11*  --   ALT 11*  --   ALKPHOS 75  --   BILITOT 0.2*  --     Cardiac Enzymes No results for input(s): TROPONINI in the last 168 hours.  Microbiology Results  Results for orders placed or performed during the hospital encounter of 04/28/15  Urine culture     Status: None   Collection Time: 04/28/15  4:24 PM  Result Value Ref Range Status   Specimen Description URINE, CATHETERIZED  Final   Special Requests Normal  Final   Culture NO GROWTH 1 DAY  Final   Report Status 04/30/2015 FINAL  Final  Wound culture     Status: None (Preliminary result)   Collection Time: 04/29/15  8:43 AM  Result Value Ref Range Status   Specimen Description TOE  Final   Special Requests NONE  Final   Gram Stain RARE WBC SEEN RARE GRAM POSITIVE COCCI   Final   Culture HOLDING FOR POSSIBLE PATHOGEN  Final   Report Status PENDING  Incomplete    RADIOLOGY:  No results found.  EKG:   Orders placed or performed in visit on 12/24/13  . EKG 12-Lead      Management plans discussed with the patient, family and they are in agreement.  CODE STATUS:   TOTAL TIME TAKING CARE OF THIS PATIENT: 50 minutes.    Katharina Caper M.D  on 04/30/2015 at 1:54 PM  Between 7am to 6pm - Pager - [phone]  After 6pm go to [email or web address] - password [email or web address] [facility]  [place] [facility]  Office  [phone]  CC: Primary care physician; No primary care provider on file.

## 2015-05-03 LAB — ANAEROBIC CULTURE

## 2015-05-03 LAB — WOUND CULTURE

## 2015-05-04 LAB — SURGICAL PATHOLOGY

## 2015-05-26 ENCOUNTER — Emergency Department
Admission: EM | Admit: 2015-05-26 | Discharge: 2015-05-26 | Disposition: A | Discharge status: Home / Self Care | Payer: Medicare Other | Attending: Emergency Medicine | Admitting: Emergency Medicine

## 2015-05-26 DIAGNOSIS — Z792 Long term (current) use of antibiotics: Secondary | ICD-10-CM | POA: Diagnosis not present

## 2015-05-26 DIAGNOSIS — F17200 Nicotine dependence, unspecified, uncomplicated: Secondary | ICD-10-CM | POA: Diagnosis not present

## 2015-05-26 DIAGNOSIS — L0203 Carbuncle of face: Secondary | ICD-10-CM | POA: Diagnosis not present

## 2015-05-26 DIAGNOSIS — L0202 Furuncle of face: Secondary | ICD-10-CM

## 2015-05-26 DIAGNOSIS — R220 Localized swelling, mass and lump, head: Secondary | ICD-10-CM | POA: Diagnosis present

## 2015-05-26 DIAGNOSIS — Z79899 Other long term (current) drug therapy: Secondary | ICD-10-CM | POA: Diagnosis not present

## 2015-05-26 MED ORDER — KETOROLAC TROMETHAMINE 10 MG PO TABS: 10.0000 mg | ORAL_TABLET | Freq: Three times a day (TID) | ORAL | Status: DC | PRN

## 2015-05-26 MED ORDER — KETOROLAC TROMETHAMINE 10 MG PO TABS
10.0000 mg | ORAL_TABLET | Freq: Once | ORAL | Status: AC
  Administered 2015-05-26: 10 mg via ORAL
  Filled 2015-05-26: qty 1

## 2015-05-26 MED ORDER — SULFAMETHOXAZOLE-TRIMETHOPRIM 800-160 MG PO TABS: 1.0000 | ORAL_TABLET | Freq: Two times a day (BID) | ORAL | Status: DC

## 2015-05-26 MED ORDER — SULFAMETHOXAZOLE-TRIMETHOPRIM 800-160 MG PO TABS
1.0000 | ORAL_TABLET | Freq: Once | ORAL | Status: AC
  Administered 2015-05-26: 1 via ORAL
  Filled 2015-05-26: qty 1

## 2015-05-26 NOTE — ED Notes (Signed)
Patient to ED for possible MRSA on right forehead. 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

## 2015-05-26 NOTE — ED Provider Notes (Signed)
[facility] Emergency Department Provider Note  ____________________________________________  Time seen: 2:45 AM  I have reviewed the triage vital signs and the nursing notes.   HISTORY  Chief Complaint Sore     HPI Jonathon York is a 40 y.o. male presents with "possible MRSA on his forehead". Patient states that for the past 2 days he has noted an area of redness and swelling on his right forehead.     Past Medical History  Diagnosis Date  . 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 Pressure ulcer stage III (HCC) 04/30/2015  . Sterile pyuria 04/30/2015  . Paraplegia (HCC) 04/30/2015  . Neurogenic bladder 04/30/2015  . 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 Malnutrition of moderate degree 04/28/2015    Past Surgical History  Procedure Laterality Date  . Toe amputation Right   . Irrigation and debridement buttocks    . Amputation toe Right 04/29/2015    Procedure: AMPUTATION TOE;  Surgeon: Linus Galas, MD;  Location: [facility] ORS;  Service: Podiatry;  Laterality: Right;    Current Outpatient Rx  Name  Route  Sig  Dispense  Refill  . amoxicillin-clavulanate (AUGMENTIN) 875-125 MG tablet   Oral   Take 1 tablet by mouth 2 (two) times daily.   28 tablet   0   . bisacodyl (DULCOLAX) 5 MG EC tablet   Oral   Take 1 tablet (5 mg total) by mouth daily as needed for moderate constipation.   30 tablet   0   . diphenhydrAMINE (BENADRYL) 25 mg capsule   Oral   Take 1 capsule (25 mg total) by mouth every 4 (four) hours as needed for itching or allergies.   30 capsule   0   . docusate sodium (COLACE) 100 MG capsule   Oral   Take 1 capsule (100 mg total) by mouth 2 (two) times daily.   10 capsule   0   . oxybutynin (DITROPAN XL) 15 MG 24 hr tablet   Oral   Take 15 mg by mouth daily.          [name] oxyCODONE  (OXY IR/ROXICODONE) 5 MG immediate release tablet   Oral   Take 1 tablet (5 mg total) by mouth every 4 (four) hours as needed for moderate pain or severe pain.   30 tablet   0     Allergies No known drug allergies No family history on file.  Social History Social History  Substance Use Topics  . Smoking status: Current Every Day Smoker  . Smokeless tobacco: None  . Alcohol Use: No    Review of Systems  Constitutional: Negative for fever. Eyes: Negative for visual changes. ENT: Negative for sore throat. Cardiovascular: Negative for chest pain. Respiratory: Negative for shortness of breath. Gastrointestinal: Negative for abdominal pain, vomiting and diarrhea. Genitourinary: Negative for dysuria. Musculoskeletal: Negative for back pain. Skin: Negative for rash. Positive for right forehead papule Neurological: Negative for headaches, focal weakness or numbness.   10-point ROS otherwise negative.  ____________________________________________   PHYSICAL EXAM:  VITAL SIGNS: ED Triage Vitals  Enc Vitals Group     BP 05/26/15 0156 126/69 mmHg     Pulse Rate 05/26/15 0156 104     Resp 05/26/15 0156 18     Temp 05/26/15 0156 98 F (36.7 C)     Temp Source 05/26/15  0156 Oral     SpO2 05/26/15 0156 100 %     Weight 05/26/15 0156 100 lb (45.36 kg)     Height 05/26/15 0156  (1.753 m)     Head Cir --      Peak Flow --      Pain Score 05/26/15 0151 10     Pain Loc --      Pain Edu? --      Excl. in GC? --      Constitutional: Alert and oriented. Well appearing and in no distress. Eyes: Conjunctivae are normal. PERRL. Normal extraocular movements. ENT   Head: Normocephalic and atraumatic.   Nose: No congestion/rhinnorhea.   Mouth/Throat: Mucous membranes are moist.   Neck: No stridor. Skin:  Skin is warm, dry and intact. No rash noted. Right forehead carbuncle with surrounding erythema proximal malleolus size of a dime noted Psychiatric: Mood and  affect are normal. Speech and behavior are normal. Patient exhibits appropriate insight and judgment.     INITIAL IMPRESSION / ASSESSMENT AND PLAN / ED COURSE  Pertinent labs & imaging results that were available during my care of the patient were reviewed by me and considered in my medical decision making (see chart for details).    ____________________________________________   FINAL CLINICAL IMPRESSION(S) / ED DIAGNOSES  Final diagnoses:  Carbuncle and furuncle of face      Darci Current, MD 05/26/15 [phone]

## 2015-05-26 NOTE — ED Notes (Signed)
Patient with sutures to toe amputation site; MD with VORB to remove sutures tonight as they have been in since procedure on 04/30/15. Site cleansed with betadine and alcohol; sutures x 12 removed. Small amount of bleeding noted with removal of one of the sutures that is embedded. MD aware.

## 2015-05-26 NOTE — Discharge Instructions (Signed)

## 2015-05-26 NOTE — ED Notes (Signed)

## 2015-07-16 ENCOUNTER — Emergency Department
Admission: EM | Admit: 2015-07-16 | Discharge: 2015-07-16 | Disposition: A | Discharge status: Home / Self Care | Payer: Medicare Other | Attending: Emergency Medicine | Admitting: Emergency Medicine

## 2015-07-16 DIAGNOSIS — R112 Nausea with vomiting, unspecified: Secondary | ICD-10-CM | POA: Diagnosis not present

## 2015-07-16 DIAGNOSIS — L8990 Pressure ulcer of unspecified site, unspecified stage: Secondary | ICD-10-CM | POA: Diagnosis not present

## 2015-07-16 DIAGNOSIS — G8220 Paraplegia, unspecified: Secondary | ICD-10-CM | POA: Diagnosis not present

## 2015-07-16 DIAGNOSIS — M86171 Other acute osteomyelitis, right ankle and foot: Secondary | ICD-10-CM | POA: Insufficient documentation

## 2015-07-16 DIAGNOSIS — L89159 Pressure ulcer of sacral region, unspecified stage: Secondary | ICD-10-CM | POA: Diagnosis not present

## 2015-07-16 DIAGNOSIS — F17200 Nicotine dependence, unspecified, uncomplicated: Secondary | ICD-10-CM | POA: Insufficient documentation

## 2015-07-16 DIAGNOSIS — M549 Dorsalgia, unspecified: Secondary | ICD-10-CM | POA: Diagnosis not present

## 2015-07-16 DIAGNOSIS — Z743 Need for continuous supervision: Secondary | ICD-10-CM | POA: Diagnosis not present

## 2015-07-16 LAB — URINALYSIS COMPLETE WITH MICROSCOPIC (ARMC ONLY)
BACTERIA UA: NONE SEEN
Bilirubin Urine: NEGATIVE
GLUCOSE, UA: NEGATIVE mg/dL
LEUKOCYTES UA: NEGATIVE
Nitrite: NEGATIVE
PH: 6 (ref 5.0–8.0)
Protein, ur: 100 mg/dL — AB
Specific Gravity, Urine: 1.025 (ref 1.005–1.030)

## 2015-07-16 LAB — CBC
HEMATOCRIT: 29 % — AB (ref 40.0–52.0)
HEMOGLOBIN: 9.1 g/dL — AB (ref 13.0–18.0)
MCH: 20.8 pg — ABNORMAL LOW (ref 26.0–34.0)
MCHC: 31.3 g/dL — ABNORMAL LOW (ref 32.0–36.0)
MCV: 66.6 fL — ABNORMAL LOW (ref 80.0–100.0)
Platelets: 420 10*3/uL (ref 150–440)
RBC: 4.35 MIL/uL — AB (ref 4.40–5.90)
RDW: 18.2 % — ABNORMAL HIGH (ref 11.5–14.5)
WBC: 9.1 10*3/uL (ref 3.8–10.6)

## 2015-07-16 LAB — COMPREHENSIVE METABOLIC PANEL
ALBUMIN: 3.2 g/dL — AB (ref 3.5–5.0)
ALK PHOS: 74 U/L (ref 38–126)
ALT: 11 U/L — AB (ref 17–63)
ANION GAP: 10 (ref 5–15)
AST: 13 U/L — AB (ref 15–41)
BUN: 15 mg/dL (ref 6–20)
CALCIUM: 8.2 mg/dL — AB (ref 8.9–10.3)
CO2: 22 mmol/L (ref 22–32)
Chloride: 104 mmol/L (ref 101–111)
Creatinine, Ser: 0.43 mg/dL — ABNORMAL LOW (ref 0.61–1.24)
GFR calc Af Amer: >60 mL/min (ref >60)
GFR calc non Af Amer: >60 mL/min (ref >60)
GLUCOSE: 113 mg/dL — AB (ref 65–99)
Potassium: 3.3 mmol/L — ABNORMAL LOW (ref 3.5–5.1)
SODIUM: 136 mmol/L (ref 135–145)
Total Bilirubin: 0.6 mg/dL (ref 0.3–1.2)
Total Protein: 7 g/dL (ref 6.5–8.1)

## 2015-07-16 MED ORDER — SODIUM CHLORIDE 0.9 % IV BOLUS (SEPSIS)
1000.0000 mL | Freq: Once | INTRAVENOUS | Status: AC
  Administered 2015-07-16: 1000 mL via INTRAVENOUS

## 2015-07-16 MED ORDER — PROMETHAZINE HCL 25 MG PO TABS: 25.0000 mg | ORAL_TABLET | Freq: Four times a day (QID) | ORAL | Status: DC | PRN

## 2015-07-16 MED ORDER — MORPHINE SULFATE (PF) 4 MG/ML IV SOLN
4.0000 mg | Freq: Once | INTRAVENOUS | Status: AC
  Administered 2015-07-16: 4 mg via INTRAVENOUS
  Filled 2015-07-16: qty 1

## 2015-07-16 MED ORDER — PROMETHAZINE HCL 25 MG/ML IJ SOLN
25.0000 mg | Freq: Once | INTRAMUSCULAR | Status: AC
  Administered 2015-07-16: 25 mg via INTRAVENOUS
  Filled 2015-07-16: qty 1

## 2015-07-16 MED ORDER — OXYCODONE-ACETAMINOPHEN 5-325 MG PO TABS: 1.0000 | ORAL_TABLET | Freq: Four times a day (QID) | ORAL | Status: DC | PRN

## 2015-07-16 NOTE — ED Notes (Signed)
Pt came in via EMS from home. Pt reports n/v x 3 days. Pt reports now he is dry heaving and reports seeing blood in his vomit. Pt denies diarrhea, fever, chills. Pt reports generalized abdominal pain. Pt is paralyzed from the waist down. Pt was given 4mg  zofran IV by EMS.

## 2015-07-16 NOTE — ED Provider Notes (Addendum)
[facility] Emergency Department Provider Note  Time seen: 1:24 PM  I have reviewed the triage vital signs and the nursing notes.   HISTORY  Chief Complaint Nausea and Emesis    HPI Jonathon York is a 40 y.o. male with a past medical history of paraplegia of his lower extremities, presents the emergency department nausea, vomiting and getting 3 days ago.According to the patient for the past 3 days he has been nauseated, dry heaving with occasional vomiting. Occasional racing streaks of blood in his vomit. Denies diarrhea, fever, chills. Patient reports mild upper abdominal pain. Nausea and vomiting. Patient does state ulcers to his sacrum which have been there for many years per patient. No longer seeing wound clinic. Describes his abdominal pain is mild, nausea is severe.     Past Medical History  Diagnosis Date  . 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 Pressure ulcer stage III (HCC) 04/30/2015  . Sterile pyuria 04/30/2015  . Paraplegia (HCC) 04/30/2015  . Neurogenic bladder 04/30/2015  . 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 Malnutrition of moderate degree 04/28/2015    Past Surgical History  Procedure Laterality Date  . Toe amputation Right   . Irrigation and debridement buttocks    . Amputation toe Right 04/29/2015    Procedure: AMPUTATION TOE;  Surgeon: Linus Galas, MD;  Location: [facility] ORS;  Service: Podiatry;  Laterality: Right;    Current Outpatient Rx  Name  Route  Sig  Dispense  Refill  . amoxicillin-clavulanate (AUGMENTIN) 875-125 MG tablet   Oral   Take 1 tablet by mouth 2 (two) times daily.   28 tablet   0   . bisacodyl (DULCOLAX) 5 MG EC tablet   Oral   Take 1 tablet (5 mg total) by mouth daily as needed for moderate constipation.   30 tablet   0   . diphenhydrAMINE (BENADRYL) 25 mg  capsule   Oral   Take 1 capsule (25 mg total) by mouth every 4 (four) hours as needed for itching or allergies.   30 capsule   0   . docusate sodium (COLACE) 100 MG capsule   Oral   Take 1 capsule (100 mg total) by mouth 2 (two) times daily.   10 capsule   0   . ketorolac (TORADOL) 10 MG tablet   Oral   Take 1 tablet (10 mg total) by mouth every 8 (eight) hours as needed.   12 tablet   0   . oxybutynin (DITROPAN XL) 15 MG 24 hr tablet   Oral   Take 15 mg by mouth daily.          [name] oxyCODONE (OXY IR/ROXICODONE) 5 MG immediate release tablet   Oral   Take 1 tablet (5 mg total) by mouth every 4 (four) hours as needed for moderate pain or severe pain.   30 tablet   0   . sulfamethoxazole-trimethoprim (BACTRIM DS,SEPTRA DS) 800-160 MG tablet   Oral   Take 1 tablet by mouth 2 (two) times daily.   20 tablet   0     Allergies Review of patient's allergies indicates no known allergies.  No family history on file.  Social History Social History  Substance Use Topics  . Smoking status: Current Every Day Smoker  . Smokeless tobacco: None  . Alcohol Use: No  Review of Systems Constitutional: Negative for fever. Cardiovascular: Negative for chest pain. Respiratory: Negative for shortness of breath. Gastrointestinal: Mild upper abdominal pain. Positive for nausea and vomiting. Negative diarrhea. Genitourinary: Negative for dysuria. Musculoskeletal: Negative for back pain. Positive for sacral ulcers times many years. Neurological: Negative for headache 10-point ROS otherwise negative.  ____________________________________________   PHYSICAL EXAM:  VITAL SIGNS: ED Triage Vitals  Enc Vitals Group     BP 07/16/15 1244 100/65 mmHg     Pulse Rate 07/16/15 1244 76     Resp 07/16/15 1244 17     Temp 07/16/15 1244 98.1 F (36.7 C)     Temp Source 07/16/15 1244 Oral     SpO2 07/16/15 1244 100 %     Weight 07/16/15 1244 86 lb 3.2 oz (39.1 kg)     Height 07/16/15  1244 5\' 5"  (1.651 m)     Head Cir --      Peak Flow --      Pain Score --      Pain Loc --      Pain Edu? --      Excl. in GC? --     Constitutional: Alert and oriented. Well appearing and in no distress.Frail/cachectic appearing. Eyes: Normal exam ENT   Head: Normocephalic and atraumatic.   Mouth/Throat: Mucous membranes are moist. Cardiovascular: Normal rate, regular rhythm. No murmur Respiratory: Normal respiratory effort without tachypnea nor retractions. Breath sounds are clear Gastrointestinal: Soft and nontender. No distention.  Musculoskeletal: Nontender with normal range of motion in all extremities. Patient does have 2 sacral ulcers, both of which are large 1 approximately 10 cm in diameter and the other approximately 5 cm in diameter area neither of which appear actively infected. Neurologic:  Normal speech and language. No gross focal neurologic deficits Skin:  Skin is warm, dry and intact.  Psychiatric: Mood and affect are normal. Speech and behavior are normal.   ____________________________________________    INITIAL IMPRESSION / ASSESSMENT AND PLAN / ED COURSE  Pertinent labs & imaging results that were available during my care of the patient were reviewed by me and considered in my medical decision making (see chart for details).  Patient presents the emergency department with nausea, vomiting, mild upper abdominal pain. Patient has no tenderness on exam. Patient does have 2 large sacral ulcers, neither of which appear to be actively infected. We will treat patient's pain, nausea, obtain labs and closely monitor.  Labs are largely within normal limits including normal white blood cell count. No signs of active infection. Nontender abdomen. No upper respiratory symptoms. We will discharge the patient with a short course of pain and nausea medication as well as a wound clinic follow-up. Patient is agreeable to  plan.  ____________________________________________   FINAL CLINICAL IMPRESSION(S) / ED DIAGNOSES  Nausea and vomiting Sacral ulcers  Minna AntisKevin Corona Popovich, MD 07/16/15 1456  Minna AntisKevin Mike Hamre, MD 07/16/15 1501

## 2015-07-16 NOTE — Discharge Instructions (Signed)
Nausea and Vomiting Nausea means you feel sick to your stomach. Throwing up (vomiting) is a reflex where stomach contents come out of your mouth. HOME CARE   Take medicine as told by your doctor.  Do not force yourself to eat. However, you do need to drink fluids.  If you feel like eating, eat a normal diet as told by your doctor.  Eat rice, wheat, potatoes, bread, lean meats, yogurt, fruits, and vegetables.  Avoid high-fat foods.  Drink enough fluids to keep your pee (urine) clear or pale yellow.  Ask your doctor how to replace body fluid losses (rehydrate). Signs of body fluid loss (dehydration) include:  Feeling very thirsty.  Dry lips and mouth.  Feeling dizzy.  Dark pee.  Peeing less than normal.  Feeling confused.  Fast breathing or heart rate. GET HELP RIGHT AWAY IF:   You have blood in your throw up.  You have black or bloody poop (stool).  You have a bad headache or stiff neck.  You feel confused.  You have bad belly (abdominal) pain.  You have chest pain or trouble breathing.  You do not pee at least once every 8 hours.  You have cold, clammy skin.  You keep throwing up after 24 to 48 hours.  You have a fever. MAKE SURE YOU:   Understand these instructions.  Will watch your condition.  Will get help right away if you are not doing well or get worse.   This information is not intended to replace advice given to you by your health care provider. Make sure you discuss any questions you have with your health care provider.   Document Released: 10/02/2007 Document Revised: 07/08/2011 Document Reviewed: 09/14/2010 Elsevier Interactive Patient Education 2016 Elsevier Inc.  Pressure Injury A pressure injury, sometimes called a bedsore, is an injury to the skin and underlying tissue caused by pressure. Pressure on blood vessels causes decreased blood flow to the skin, which can eventually cause the skin tissue to die and break down into a  wound. Pressure injuries usually occur:  Over bony parts of the body such as the tailbone, shoulders, elbows, hips, and heels.  Under medical devices such as respiratory equipment, stockings, tubes, and splints. Pressure injuries start as reddened areas on the skin and can lead to pain, muscle damage, and infection. Pressure injuries can vary in severity.  CAUSES Pressure injuries are caused by a lack of blood supply to an area of skin. They can occur from intense pressure over a short period of time or from less intense pressure over a long period of time. RISK FACTORS This condition is more likely to develop in people who:  Are in the hospital or an extended care facility.  Are bedridden or in a wheelchair.  Have an injury or disease that keeps them from:  Moving normally.  Feeling pain or pressure.  Have a condition that:  Makes them sleepy or less alert.  Causes poor blood flow.  Need to wear a medical device.  Have poor control of their bladder or bowel functions (incontinence).  Have poor nutrition (malnutrition).  Are of certain ethnicities. People of African American and Latino or Hispanic descent are at higher risk compared to other ethnic groups. If you are at risk for pressure ulcers, your health care provider may recommend certain types of bedding to help prevent them. These may include foam or gel mattresses covered with one of the following:  A sheepskin blanket.  A pad that is filled  with gel, air, water, or foam. SYMPTOMS  The main symptom is a blister or change in skin color that opens into a wound. Other symptoms include:   Red or dark areas of skin that do not turn white or pale when pressed with a finger.   Pain, warmth, or change of skin texture.  DIAGNOSIS This condition is diagnosed with a medical history and physical exam. You may also have tests, including:   Blood tests to check for infection or signs of poor nutrition.  Imaging studies  to check for damage to the deep tissues under your skin.  Blood flow studies. Your pressure injury will be staged to determine its severity. Staging is an assessment of:  The depth of the pressure injury.  Which tissues are exposed because of the pressure injury.  The causes of the pressure injury. TREATMENT The main focus of treatment is to help your injury heal. This may be done by:   Relieving or redistributing pressure on your skin. This includes:  Frequently changing your position.  Eliminating or minimizing positions that caused the wound or that can make the wound worse.  Using specific bed mattresses and chair cushions.  Refitting, resizing, or replacing any medical devices, or padding the skin under them.  Using creams or powders to prevent rubbing (friction) on the skin.  Keeping your skin clean and dry. This may include using a skin cleanser or skin protectant as told by your health care provider. This may be a lotion, ointment, or spray.  Cleaning your injury and removing any dead tissue from the wound (debridement).  Placing a bandage (dressing) over your injury.  Preventing or treating infection. This may include antibiotic, antimicrobial, or antiseptic medicines. Treatment may also include medicine for pain. Sometimes surgery is needed to close the wound with a flap of healthy skin or a piece of skin from another area of your body (graft). You may need surgery if other treatments are not working or if your injury is very deep. HOME CARE INSTRUCTIONS Wound Care  Follow instructions from your health care provider about:  How to take care of your wound.  When and how you should change your dressing.  When you should remove your dressing. If your dressing is dry and stuck when you try to remove it, moisten or wet the dressing with saline or water so that it can be removed without harming your skin or wound tissue.  Check your wound every day for signs of  infection. Have a caregiver do this for you if you are not able. Watch for:  More redness, swelling, or pain.  More fluid, blood, or pus.  A bad smell. Skin Care  Keep your skin clean and dry. Gently pat your skin dry.  Do not rub or massage your skin.  Use a skin protectant only as told by your health care provider.  Check your skin every day for any changes in color or any new blisters or sores (ulcers). Have a caregiver do this for you if you are not able. Medicines  Take over-the-counter and prescription medicines only as told by your health care provider.  If you were prescribed an antibiotic medicine, take it or apply it as told by your health care provider. Do not stop taking or using the antibiotic even if your condition improves. Reducing and Redistributing Pressure  Do not lie or sit in one position for a long time. Move or change position every two hours or as told by  your health care provider.  Use pillows or cushions to reduce pressure. Ask your health care provider to recommend cushions or pads for you.  Use medical devices that do not rub your skin. Tell your health care provider if one of your medical devices is causing a pressure injury to develop. General Instructions  Eat a healthy diet that includes lots of protein. Ask your health care provider for diet advice.  Drink enough fluid to keep your urine clear or pale yellow.  Be as active as you can every day. Ask your health care provider to suggest safe exercises or activities.  Do not abuse drugs or alcohol.  Keep all follow-up visits as told by your health care provider. This is important.  Do not smoke. SEEK MEDICAL CARE IF:  You have chills or fever.  Your pain medicine is not helping.  You have any changes in skin color.  You have new blisters or sores.  You develop warmth, redness, or swelling near a pressure injury.  You have a bad odor or pus coming from your pressure injury.  You  lose control of your bowels or bladder.  You develop new symptoms.  Your wound does not improve after 1-2 weeks of treatment.  You develop a new medical condition, such as diabetes, peripheral vascular disease, or conditions that affect your defense (immune) system.   This information is not intended to replace advice given to you by your health care provider. Make sure you discuss any questions you have with your health care provider.   Document Released: 04/15/2005 Document Revised: 01/04/2015 Document Reviewed: 08/24/2014 Elsevier Interactive Patient Education [employer]2016 Elsevier Inc.

## 2016-04-15 ENCOUNTER — Emergency Department: Payer: Medicare Other

## 2016-04-15 ENCOUNTER — Encounter: Payer: Self-pay | Admitting: Emergency Medicine

## 2016-04-15 ENCOUNTER — Inpatient Hospital Stay
Admission: EM | Admit: 2016-04-15 | Discharge: 2016-04-18 | DRG: 871 | Disposition: A | Discharge status: Home Health | Payer: Medicare Other | Attending: Internal Medicine | Admitting: Internal Medicine

## 2016-04-15 DIAGNOSIS — Z89421 Acquired absence of other right toe(s): Secondary | ICD-10-CM

## 2016-04-15 DIAGNOSIS — T148XXA Other injury of unspecified body region, initial encounter: Secondary | ICD-10-CM | POA: Diagnosis not present

## 2016-04-15 DIAGNOSIS — Z87440 Personal history of urinary (tract) infections: Secondary | ICD-10-CM

## 2016-04-15 DIAGNOSIS — B9620 Unspecified Escherichia coli [E. coli] as the cause of diseases classified elsewhere: Secondary | ICD-10-CM | POA: Diagnosis present

## 2016-04-15 DIAGNOSIS — F332 Major depressive disorder, recurrent severe without psychotic features: Secondary | ICD-10-CM | POA: Diagnosis not present

## 2016-04-15 DIAGNOSIS — D500 Iron deficiency anemia secondary to blood loss (chronic): Secondary | ICD-10-CM | POA: Diagnosis present

## 2016-04-15 DIAGNOSIS — R7989 Other specified abnormal findings of blood chemistry: Secondary | ICD-10-CM | POA: Diagnosis present

## 2016-04-15 DIAGNOSIS — E876 Hypokalemia: Secondary | ICD-10-CM | POA: Diagnosis present

## 2016-04-15 DIAGNOSIS — L089 Local infection of the skin and subcutaneous tissue, unspecified: Secondary | ICD-10-CM

## 2016-04-15 DIAGNOSIS — N390 Urinary tract infection, site not specified: Secondary | ICD-10-CM | POA: Diagnosis not present

## 2016-04-15 DIAGNOSIS — G8220 Paraplegia, unspecified: Secondary | ICD-10-CM | POA: Diagnosis present

## 2016-04-15 DIAGNOSIS — L89159 Pressure ulcer of sacral region, unspecified stage: Secondary | ICD-10-CM

## 2016-04-15 DIAGNOSIS — L89154 Pressure ulcer of sacral region, stage 4: Secondary | ICD-10-CM | POA: Diagnosis present

## 2016-04-15 DIAGNOSIS — Z87891 Personal history of nicotine dependence: Secondary | ICD-10-CM | POA: Diagnosis not present

## 2016-04-15 DIAGNOSIS — Z79899 Other long term (current) drug therapy: Secondary | ICD-10-CM | POA: Diagnosis not present

## 2016-04-15 DIAGNOSIS — A419 Sepsis, unspecified organism: Secondary | ICD-10-CM | POA: Diagnosis present

## 2016-04-15 DIAGNOSIS — F339 Major depressive disorder, recurrent, unspecified: Secondary | ICD-10-CM | POA: Diagnosis not present

## 2016-04-15 LAB — MAGNESIUM: Magnesium: 2 mg/dL (ref 1.7–2.4)

## 2016-04-15 LAB — DIFFERENTIAL
BASOS ABS: 0.1 10*3/uL (ref 0–0.1)
Basophils Relative: 1 %
EOS ABS: 0.1 10*3/uL (ref 0–0.7)
Eosinophils Relative: 1 %
LYMPHS ABS: 2.1 10*3/uL (ref 1.0–3.6)
Lymphocytes Relative: 16 %
Monocytes Absolute: 0.9 10*3/uL (ref 0.2–1.0)
Monocytes Relative: 7 %
NEUTROS ABS: 10.3 10*3/uL — AB (ref 1.4–6.5)
NEUTROS PCT: 75 %

## 2016-04-15 LAB — TROPONIN I: Troponin I: <0.03 ng/mL (ref <0.03)

## 2016-04-15 LAB — BASIC METABOLIC PANEL
Anion gap: 8 (ref 5–15)
BUN: 12 mg/dL (ref 6–20)
CALCIUM: 8.4 mg/dL — AB (ref 8.9–10.3)
CHLORIDE: 103 mmol/L (ref 101–111)
CO2: 27 mmol/L (ref 22–32)
CREATININE: 0.66 mg/dL (ref 0.61–1.24)
GFR calc Af Amer: >60 mL/min (ref >60)
GFR calc non Af Amer: >60 mL/min (ref >60)
GLUCOSE: 110 mg/dL — AB (ref 65–99)
Potassium: 3.4 mmol/L — ABNORMAL LOW (ref 3.5–5.1)
Sodium: 138 mmol/L (ref 135–145)

## 2016-04-15 LAB — URINALYSIS, COMPLETE (UACMP) WITH MICROSCOPIC
BILIRUBIN URINE: NEGATIVE
Glucose, UA: NEGATIVE mg/dL
Hgb urine dipstick: NEGATIVE
Ketones, ur: NEGATIVE mg/dL
Nitrite: POSITIVE — AB
PROTEIN: 30 mg/dL — AB
SPECIFIC GRAVITY, URINE: 1.023 (ref 1.005–1.030)
pH: 6 (ref 5.0–8.0)

## 2016-04-15 LAB — HEPATIC FUNCTION PANEL
ALT: 12 U/L — ABNORMAL LOW (ref 17–63)
AST: 19 U/L (ref 15–41)
Albumin: 2.5 g/dL — ABNORMAL LOW (ref 3.5–5.0)
Alkaline Phosphatase: 80 U/L (ref 38–126)
Bilirubin, Direct: <0.1 mg/dL — ABNORMAL LOW (ref 0.1–0.5)
TOTAL PROTEIN: 7.7 g/dL (ref 6.5–8.1)
Total Bilirubin: <0.1 mg/dL — ABNORMAL LOW (ref 0.3–1.2)

## 2016-04-15 LAB — CBC
HEMATOCRIT: 28.8 % — AB (ref 40.0–52.0)
HEMOGLOBIN: 8.9 g/dL — AB (ref 13.0–18.0)
MCH: 18.8 pg — AB (ref 26.0–34.0)
MCHC: 30.9 g/dL — AB (ref 32.0–36.0)
MCV: 60.8 fL — AB (ref 80.0–100.0)
Platelets: 688 10*3/uL — ABNORMAL HIGH (ref 150–440)
RBC: 4.74 MIL/uL (ref 4.40–5.90)
RDW: 19.9 % — AB (ref 11.5–14.5)
WBC: 13.5 10*3/uL — ABNORMAL HIGH (ref 3.8–10.6)

## 2016-04-15 LAB — PROTIME-INR
INR: 1.11
INR: 1.17
PROTHROMBIN TIME: 15 s (ref 11.4–15.2)
Prothrombin Time: 14.4 seconds (ref 11.4–15.2)

## 2016-04-15 LAB — PROCALCITONIN

## 2016-04-15 LAB — APTT: aPTT: 35 seconds (ref 24–36)

## 2016-04-15 LAB — MRSA PCR SCREENING: MRSA BY PCR: NEGATIVE

## 2016-04-15 LAB — LIPASE, BLOOD: LIPASE: 17 U/L (ref 11–51)

## 2016-04-15 LAB — LACTIC ACID, PLASMA: LACTIC ACID, VENOUS: 1.1 mmol/L (ref 0.5–1.9)

## 2016-04-15 MED ORDER — VANCOMYCIN HCL IN DEXTROSE 1-5 GM/200ML-% IV SOLN
1000.0000 mg | Freq: Once | INTRAVENOUS | Status: AC
  Administered 2016-04-15: 1000 mg via INTRAVENOUS
  Filled 2016-04-15: qty 200

## 2016-04-15 MED ORDER — ACETAMINOPHEN 650 MG RE SUPP: 650.0000 mg | Freq: Four times a day (QID) | RECTAL | Status: DC | PRN

## 2016-04-15 MED ORDER — ONDANSETRON HCL 4 MG/2ML IJ SOLN
4.0000 mg | Freq: Four times a day (QID) | INTRAMUSCULAR | Status: DC | PRN
  Administered 2016-04-17: 4 mg via INTRAVENOUS
  Filled 2016-04-15: qty 2

## 2016-04-15 MED ORDER — SODIUM CHLORIDE 0.9 % IV BOLUS (SEPSIS)
500.0000 mL | Freq: Once | INTRAVENOUS | Status: AC
  Administered 2016-04-15: 500 mL via INTRAVENOUS

## 2016-04-15 MED ORDER — ENOXAPARIN SODIUM 40 MG/0.4ML ~~LOC~~ SOLN
40.0000 mg | SUBCUTANEOUS | Status: DC
Start: 2016-04-15 — End: 2016-04-16
  Filled 2016-04-15: qty 0.4

## 2016-04-15 MED ORDER — VANCOMYCIN HCL IN DEXTROSE 750-5 MG/150ML-% IV SOLN
750.0000 mg | Freq: Two times a day (BID) | INTRAVENOUS | Status: DC
  Administered 2016-04-15 – 2016-04-16 (×2): 750 mg via INTRAVENOUS
  Filled 2016-04-15 (×3): qty 150

## 2016-04-15 MED ORDER — POTASSIUM CHLORIDE IN NACL 20-0.9 MEQ/L-% IV SOLN
INTRAVENOUS | Status: DC
  Administered 2016-04-15 – 2016-04-16 (×2): via INTRAVENOUS
  Filled 2016-04-15 (×4): qty 1000

## 2016-04-15 MED ORDER — ONDANSETRON HCL 4 MG/2ML IJ SOLN
4.0000 mg | INTRAMUSCULAR | Status: AC
  Administered 2016-04-15: 4 mg via INTRAVENOUS
  Filled 2016-04-15: qty 2

## 2016-04-15 MED ORDER — ALBUTEROL SULFATE (2.5 MG/3ML) 0.083% IN NEBU: 2.5000 mg | INHALATION_SOLUTION | RESPIRATORY_TRACT | Status: DC | PRN

## 2016-04-15 MED ORDER — ACETAMINOPHEN 325 MG PO TABS: 650.0000 mg | ORAL_TABLET | Freq: Four times a day (QID) | ORAL | Status: DC | PRN

## 2016-04-15 MED ORDER — HYDROCODONE-ACETAMINOPHEN 5-325 MG PO TABS
1.0000 | ORAL_TABLET | ORAL | Status: DC | PRN
  Administered 2016-04-15: 1 via ORAL
  Administered 2016-04-16: 2 via ORAL
  Administered 2016-04-16: 1 via ORAL
  Administered 2016-04-16: 2 via ORAL
  Administered 2016-04-17 (×2): 1 via ORAL
  Filled 2016-04-15: qty 2
  Filled 2016-04-15: qty 1
  Filled 2016-04-15: qty 2
  Filled 2016-04-15 (×3): qty 1

## 2016-04-15 MED ORDER — SODIUM CHLORIDE 0.9 % IV BOLUS (SEPSIS)
1000.0000 mL | Freq: Once | INTRAVENOUS | Status: AC
  Administered 2016-04-15: 1000 mL via INTRAVENOUS

## 2016-04-15 MED ORDER — BISACODYL 5 MG PO TBEC: 5.0000 mg | DELAYED_RELEASE_TABLET | Freq: Every day | ORAL | Status: DC | PRN

## 2016-04-15 MED ORDER — PIPERACILLIN-TAZOBACTAM 3.375 G IVPB
3.3750 g | Freq: Three times a day (TID) | INTRAVENOUS | Status: DC
  Administered 2016-04-15 – 2016-04-18 (×9): 3.375 g via INTRAVENOUS
  Filled 2016-04-15 (×10): qty 50

## 2016-04-15 MED ORDER — ONDANSETRON HCL 4 MG PO TABS: 4.0000 mg | ORAL_TABLET | Freq: Four times a day (QID) | ORAL | Status: DC | PRN

## 2016-04-15 MED ORDER — DIPHENHYDRAMINE HCL 25 MG PO CAPS
25.0000 mg | ORAL_CAPSULE | Freq: Four times a day (QID) | ORAL | Status: DC | PRN
  Administered 2016-04-15 – 2016-04-18 (×2): 25 mg via ORAL
  Filled 2016-04-15 (×2): qty 1

## 2016-04-15 MED ORDER — OXYBUTYNIN CHLORIDE ER 5 MG PO TB24
15.0000 mg | ORAL_TABLET | Freq: Two times a day (BID) | ORAL | Status: DC
  Administered 2016-04-15 – 2016-04-18 (×7): 15 mg via ORAL
  Filled 2016-04-15 (×7): qty 3

## 2016-04-15 MED ORDER — MORPHINE SULFATE (PF) 4 MG/ML IV SOLN
4.0000 mg | Freq: Once | INTRAVENOUS | Status: AC
  Administered 2016-04-15: 4 mg via INTRAVENOUS
  Filled 2016-04-15: qty 1

## 2016-04-15 MED ORDER — PIPERACILLIN-TAZOBACTAM 3.375 G IVPB 30 MIN
3.3750 g | Freq: Once | INTRAVENOUS | Status: AC
  Administered 2016-04-15: 3.375 g via INTRAVENOUS
  Filled 2016-04-15: qty 50

## 2016-04-15 NOTE — Consult Note (Signed)
Pharmacy Antibiotic Note  Jonathon LeedsRichard L Gentzler is a 40 y.o. male admitted on 04/15/2016 with sepsis.  Pharmacy has been consulted for vancomycin and cefepime dosing. Pt has sacral wound with discharge, and burning upon urination  Plan: Pt received 1g of vancomycin in the ED. Will give next dose in 8 hours for stacked dosing.  Vancomycin 750 IV every 12 hours.  Goal trough 15-20 mcg/mL. Zosyn 3.375g IV q8h (4 hour infusion).  Trough prior to 5th dose 12/20 @ 0830 Doses calculated using a crcl of 82.216ml/min using height of 70 inch found in care everywhere.  Weight: 105 lb (47.6 kg)  Temp (24hrs), Avg:98.9 F (37.2 C), Min:98.9 F (37.2 C), Max:98.9 F (37.2 C)   Recent Labs Lab 04/15/16 1025 04/15/16 1137  WBC 13.5*  --   CREATININE 0.66  --   LATICACIDVEN  --  1.1    CrCl cannot be calculated (Unknown ideal weight.).    No Known Allergies  Antimicrobials this admission: vancomycin 12/18 >>  zosyn 12/18 >>   Dose adjustments this admission:   Microbiology results: 12/18 BCx:  12/18 UCx:   12/18 wound:  Thank you for allowing pharmacy to be a part of this patient's care.  Olene FlossMelissa D Rosamaria Donn, Pharm.D, BCPS Clinical Pharmacist  04/15/2016 2:26 PM

## 2016-04-15 NOTE — ED Provider Notes (Signed)
[facility] Emergency Department Provider Note  ____________________________________________   First MD Initiated Contact with Patient 04/15/16 1113     (approximate)  I have reviewed the triage vital signs and the nursing notes.   HISTORY  Chief Complaint Wound Infection and Recurrent UTI    HPI Jonathon York is a 40 y.o. 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  He reports that this has been gradually worsening over the last few weeks but much worse over the last couple of days.  He has not been going to wound care or his primary care doctor for months because he reports that he was told they could not do anything for him so he just stated home and waited to die.He cares for himself and has no assistance at home.  He tries to do wet-to-dry wound dressings to his sacral wounds but cannot see them.  He knows he has 2 wounds on the back side and that they have been draining copious green material.  He also states that his urine has been very foul-smelling and thick and dark recently and he fears he has a urinary tract infection.  He states he has had several episodes of passing out over the last month although not within the last few days.  He denies chest pain, shortness of breath.  He does state he has some lower abdominal pain that radiates into his back on both sides.  He reports that his neurological status is unchanged from prior with paraplegia at T12 but sensation that goes down to his pelvis.   Past Medical History:  Diagnosis Date  . 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 Sepsis (HCC) 04/15/2016  . 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 Pressure ulcer stage III 04/30/2015  . Sterile pyuria 04/30/2015  . Paraplegia (HCC) 04/30/2015  . Neurogenic bladder 04/30/2015  . 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 Malnutrition of moderate degree 04/28/2015    Past Surgical History:  Procedure Laterality Date  . AMPUTATION TOE Right 04/29/2015   Procedure: AMPUTATION TOE;  Surgeon: Linus Galasodd Cline, MD;  Location: [facility] ORS;  Service: Podiatry;  Laterality: Right;  . IRRIGATION AND DEBRIDEMENT BUTTOCKS    . TOE AMPUTATION Right     Prior to Admission medications   Medication Sig Start Date End Date Taking? Authorizing Provider  oxybutynin (DITROPAN XL) 15 MG 24 hr tablet Take 15 mg by mouth 2 (two) times daily.    Yes Historical Provider, MD  bisacodyl (DULCOLAX) 5 MG EC tablet Take 1 tablet (5 mg total) by mouth daily as needed for moderate constipation. Patient not taking: Reported on 04/15/2016 04/30/15   Katharina Caperima Vaickute, MD  diphenhydrAMINE (BENADRYL) 25 mg capsule Take 1 capsule (25 mg total) by mouth every 4 (four) hours as needed for itching or allergies. Patient not taking: Reported on 04/15/2016 04/30/15   Katharina Caperima Vaickute, MD  docusate sodium (COLACE) 100 MG capsule Take 1 capsule (100 mg total) by mouth 2 (two) times daily. Patient not taking: Reported on 04/15/2016 04/30/15   Katharina Caperima Vaickute, MD  ketorolac (TORADOL) 10 MG tablet Take 1 tablet (10 mg total) by mouth every 8 (eight) hours as needed. Patient not  taking: Reported on 04/15/2016 05/26/15   Darci Currentandolph N Brown, MD  oxyCODONE (OXY IR/ROXICODONE) 5 MG immediate release tablet Take 1 tablet (5 mg total) by mouth every 4 (four) hours as needed for moderate pain or severe pain. Patient not taking: Reported on 04/15/2016 04/30/15   Katharina Caperima Vaickute, MD  oxyCODONE-acetaminophen (ROXICET) 5-325 MG tablet Take 1 tablet by mouth every 6 (six) hours as needed. Patient not taking: Reported on 04/15/2016 07/16/15   Minna AntisKevin Paduchowski, MD  promethazine (PHENERGAN) 25 MG  tablet Take 1 tablet (25 mg total) by mouth every 6 (six) hours as needed for nausea or vomiting. Patient not taking: Reported on 04/15/2016 07/16/15   Minna AntisKevin Paduchowski, MD    Allergies Patient has no known allergies.  Family History  Problem Relation Age of Onset  . Lymphoma Sister     Social History Social History  Substance Use Topics  . Smoking status: Former Smoker    Packs/day: 0.00    Types: Cigarettes  . Smokeless tobacco: Never Used  . Alcohol use No    Review of Systems Constitutional: +fever/chills, generalized weakness, fatigue Eyes: No visual changes. ENT: No sore throat. Cardiovascular: Denies chest pain.  Several syncopal episodes over the last month. Respiratory: Denies shortness of breath. Gastrointestinal: Lower abdominal pain.  Nausea, no vomiting.  No diarrhea.  No constipation. Genitourinary: Foul-smelling urine with incontinence.   Musculoskeletal: Negative for back pain. Skin: Two large sacral decubitus ulcers with copious greenish pus/discharge Neurological: Negative for headaches, focal weakness or numbness.  10-point ROS otherwise negative.  ____________________________________________   PHYSICAL EXAM:  VITAL SIGNS: ED Triage Vitals [04/15/16 1019]  Enc Vitals Group     BP 127/62     Pulse Rate (!) 117     Resp 16     Temp 98.9 F (37.2 C)     Temp Source Oral     SpO2 100 %     Weight 105 lb (47.6 kg)     Height      Head Circumference      Peak Flow      Pain Score 10     Pain Loc      Pain Edu?      Excl. in GC?     Constitutional: Alert and oriented. Cachectic.  Appearance of chronic illness Eyes: Conjunctivae are normal. PERRL. EOMI. Head: Atraumatic. Nose: No congestion/rhinnorhea. Mouth/Throat: Mucous membranes are tacky.  Oropharynx non-erythematous. Neck: No stridor.  No meningeal signs.   Cardiovascular: Tachycardia at rest, regular rhythm. Good peripheral circulation. Grossly normal heart sounds. Respiratory:  Normal respiratory effort.  No retractions. Lungs CTAB. Gastrointestinal: Soft, thin, With diffuse lower abdominal tenderness but without any specific focal tenderness to palpation.  No rebound/guarding. Genitourinary: Normal external genitalia.  The patient has a large, approximately 20 cm x 20 cm sacral decubitus ulcer that is deep and appears chronic but also with purulent drainage.  It extends inferiorly all the way down to the anus.  Additionally he has a second ulcer on his left hip that is smaller but still sizable and with purulent material Musculoskeletal: no gross deformities.  lower extremities with chronic contractures and deconditioning due to paraplegia Neurologic:  Normal speech and language. No gross focal neurologic deficits are appreciated.  Skin:  Sacral decubitus wounds as described above in GU Psychiatric: Mood and affect are normal. Speech and behavior are normal.  ____________________________________________   LABS (all labs ordered are listed, but only abnormal results are displayed)  Labs Reviewed  CBC -  Abnormal; Notable for the following:       Result Value   WBC 13.5 (*)    Hemoglobin 8.9 (*)    HCT 28.8 (*)    MCV 60.8 (*)    MCH 18.8 (*)    MCHC 30.9 (*)    RDW 19.9 (*)    Platelets 688 (*)    All other components within normal limits  BASIC METABOLIC PANEL - Abnormal; Notable for the following:    Potassium 3.4 (*)    Glucose, Bld 110 (*)    Calcium 8.4 (*)    All other components within normal limits  HEPATIC FUNCTION PANEL - Abnormal; Notable for the following:    Albumin 2.5 (*)    ALT 12 (*)    Total Bilirubin <0.1 (*)    Bilirubin, Direct <0.1 (*)    All other components within normal limits  URINALYSIS, COMPLETE (UACMP) WITH MICROSCOPIC - Abnormal; Notable for the following:    Color, Urine YELLOW (*)    APPearance CLOUDY (*)    Protein, ur 30 (*)    Nitrite POSITIVE (*)    Leukocytes, UA MODERATE (*)    Bacteria, UA FEW (*)    Squamous  Epithelial / LPF 0-5 (*)    All other components within normal limits  DIFFERENTIAL - Abnormal; Notable for the following:    Neutro Abs 10.3 (*)    All other components within normal limits  AEROBIC/ANAEROBIC CULTURE (SURGICAL/DEEP WOUND)  URINE CULTURE  CULTURE, BLOOD (ROUTINE X 2)  CULTURE, BLOOD (ROUTINE X 2)  LACTIC ACID, PLASMA  LIPASE, BLOOD  TROPONIN I  PROTIME-INR  LACTIC ACID, PLASMA   ____________________________________________  EKG  ED ECG REPORT I, Jenissa Tyrell, the attending physician, personally viewed and interpreted this ECG.  Date: 04/15/2016 EKG Time: 11:46 Rate: 117 Rhythm: sinus tachycardia QRS Axis: RAD Intervals: normal ST/T Wave abnormalities: Non-specific ST segment / T-wave changes, but no evidence of acute ischemia. Conduction Disturbances: none Narrative Interpretation: No evidence of acute ischemia  ____________________________________________  RADIOLOGY   Dg Chest Port 1 View  Result Date: 04/15/2016 CLINICAL DATA:  Sepsis. EXAM: PORTABLE CHEST 1 VIEW COMPARISON:  Radiographs of December 24, 2013. FINDINGS: The heart size and mediastinal contours are within normal limits. Both lungs are clear. No pneumothorax or pleural effusion is noted. Status post surgical fixation of lower thoracic spine. IMPRESSION: No acute cardiopulmonary abnormality seen. Electronically Signed   By: Lupita Raider, M.D.   On: 04/15/2016 11:47    ____________________________________________   PROCEDURES  Procedure(s) performed:   .Critical Care Performed by: Loleta Rose Authorized by: Loleta Rose   Critical care provider statement:    Critical care time (minutes):  30   Critical care time was exclusive of:  Separately billable procedures and treating other patients   Critical care was necessary to treat or prevent imminent or life-threatening deterioration of the following conditions:  Sepsis   Critical care was time spent personally by me on the  following activities:  Development of treatment plan with patient or surrogate, discussions with consultants, evaluation of patient's response to treatment, examination of patient, obtaining history from patient or surrogate, ordering and performing treatments and interventions, ordering and review of laboratory studies, ordering and review of radiographic studies, pulse oximetry, re-evaluation of patient's condition and review of old charts      Critical Care performed: Yes, see critical care procedure note(s) ____________________________________________   INITIAL IMPRESSION / ASSESSMENT AND PLAN / ED COURSE  Pertinent labs &  imaging results that were available during my care of the patient were reviewed by me and considered in my medical decision making (see chart for details).  I evaluated the patient shortly after he was placed in an exam room.  he has tachycardia between 115 and 120 at rest and a leukocytosis with complaints of chills and subjective fevers.  Meets sepsis criteria.  Calling Code Sepsis, continuing with workup including lactate, empiric antibiotics af ter UA and culture(s), 26mL/kg of IV fluids.  Will reassess but anticipate non-ICU admission for antibiotics, wound care consult and possible debridment, etc.   Clinical Course as of Apr 16 1407  Mon Apr 15, 2016  1218 I reviewed the patient's prescription history over the last 12 months in the [place] Controlled Substances Database, and he has not had any controlled substance prescriptions filled in approximately 9 months since his last emergency department visit.  [CF]  1248 Grossly infected urine in addition to the acute on chronic infection of the sacral decubitus ulcers.  Paging hospitalist for admission.  Patient's pain well-controlled with morphine.  [CF]    Clinical Course User Index [CF] Loleta Rose, MD    ____________________________________________  FINAL CLINICAL IMPRESSION(S) / ED DIAGNOSES  Final diagnoses:    Sepsis, due to unspecified organism [facility])  Urinary tract infection without hematuria, site unspecified  Wound infection  Decubitus ulcer of sacral region, unspecified ulcer stage  Paraplegia (HCC)     MEDICATIONS GIVEN DURING THIS VISIT:  Medications  sodium chloride 0.9 % bolus 1,000 mL (1,000 mLs Intravenous New Bag/Given 04/15/16 1147)    And  sodium chloride 0.9 % bolus 500 mL (not administered)  piperacillin-tazobactam (ZOSYN) IVPB 3.375 g (0 g Intravenous Stopped 04/15/16 1253)  vancomycin (VANCOCIN) IVPB 1000 mg/200 mL premix (1,000 mg Intravenous New Bag/Given 04/15/16 1256)  morphine 4 MG/ML injection 4 mg (4 mg Intravenous Given 04/15/16 1210)  ondansetron (ZOFRAN) injection 4 mg (4 mg Intravenous Given 04/15/16 1210)     NEW OUTPATIENT MEDICATIONS STARTED DURING THIS VISIT:  New Prescriptions   No medications on file    Modified Medications   No medications on file    Discontinued Medications   AMOXICILLIN-CLAVULANATE (AUGMENTIN) 875-125 MG TABLET    Take 1 tablet by mouth 2 (two) times daily.   SULFAMETHOXAZOLE-TRIMETHOPRIM (BACTRIM DS,SEPTRA DS) 800-160 MG TABLET    Take 1 tablet by mouth 2 (two) times daily.     Note:  This document was prepared using Dragon voice recognition software and may include unintentional dictation errors.    Loleta Rose, MD 04/15/16 [phone]

## 2016-04-15 NOTE — ED Notes (Signed)
Pt states he self caths q6h. Informed to hit call bell when he needs to cath again since this RN just cathed pt. Will provide pt with cath kit when it is time.

## 2016-04-15 NOTE — ED Notes (Signed)
Pt given cath kit to use when he needs to cath again. Pt provided a cup of ice water.

## 2016-04-15 NOTE — H&P (Addendum)
[facility]   PATIENT NAME: Jonathon York    MR#:  [id number]  DATE OF BIRTH:  May 12, 1975  DATE OF ADMISSION:  04/15/2016  PRIMARY CARE PHYSICIAN: [facility]   REQUESTING/REFERRING PHYSICIAN: Loleta Roseory Forbach, MD  CHIEF COMPLAINT:   Chief Complaint  Patient presents with  . Wound Infection  . Recurrent UTI   Fever, chills and wound infection with discharge for a few weeks HISTORY OF PRESENT ILLNESS:  Jonathon York  is a 40 y.o. male with a known history of Paraplegia of both legs presents with fever, chills, rapid heart rate, and foul-smelling drainage from the sacral wounds as well as foul-smelling and copious urine for a few weeks, worsening for a couple days. He is bedbound and wheelchair-bound, living alone and take care of himself. 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 He noticed for melena and dark discharge from sacral wounds. In addition he complains of burning sensation and incontinence. He was found septic and given antibiotics in the ED.  PAST MEDICAL HISTORY:   Past Medical History:  Diagnosis Date  . Paraparesis of both lower limbs (HCC) 02/21/00    PAST SURGICAL HISTORY:   Past Surgical History:  Procedure Laterality Date  . AMPUTATION TOE Right 04/29/2015   Procedure: AMPUTATION TOE;  Surgeon: Linus Galasodd Cline, MD;  Location: [facility] ORS;  Service: Podiatry;  Laterality: Right;  . IRRIGATION AND DEBRIDEMENT BUTTOCKS    . TOE AMPUTATION Right     SOCIAL HISTORY:   Social History  Substance Use Topics  . Smoking status: Former Smoker    Packs/day: 0.00    Types: Cigarettes  . Smokeless tobacco: Never Used  . Alcohol use No    FAMILY HISTORY:   Family History  Problem Relation Age of Onset  . Lymphoma Sister     DRUG ALLERGIES:  No Known Allergies  REVIEW OF SYSTEMS:   Review of Systems  Constitutional: Positive for chills, fever and malaise/fatigue.  HENT: Negative  for congestion.   Eyes: Negative for blurred vision and double vision.  Respiratory: Negative for cough, shortness of breath and stridor.   Cardiovascular: Negative for chest pain and leg swelling.  Gastrointestinal: Positive for abdominal pain and nausea. Negative for blood in stool, diarrhea, melena and vomiting.  Genitourinary: Positive for dysuria and frequency. Negative for flank pain and hematuria.  Musculoskeletal: Negative for joint pain.  Neurological: Positive for weakness. Negative for dizziness, focal weakness and loss of consciousness.  Psychiatric/Behavioral: Negative for depression. The patient is nervous/anxious.     MEDICATIONS AT HOME:   Prior to Admission medications   Medication Sig Start Date End Date Taking? Authorizing Provider  oxybutynin (DITROPAN XL) 15 MG 24 hr tablet Take 15 mg by mouth 2 (two) times daily.    Yes Historical Provider, MD  bisacodyl (DULCOLAX) 5 MG EC tablet Take 1 tablet (5 mg total) by mouth daily as needed for moderate constipation. Patient not taking: Reported on 04/15/2016 04/30/15   Katharina Caperima Vaickute, MD  diphenhydrAMINE (BENADRYL) 25 mg capsule Take 1 capsule (25 mg total) by mouth every 4 (four) hours as needed for itching or allergies. Patient not taking: Reported on 04/15/2016 04/30/15   Katharina Caperima Vaickute, MD  docusate sodium (COLACE) 100 MG capsule Take 1 capsule (100 mg total) by mouth 2 (two) times daily. Patient not taking: Reported on 04/15/2016 04/30/15   Katharina Caperima Vaickute, MD  ketorolac (TORADOL) 10 MG tablet Take  1 tablet (10 mg total) by mouth every 8 (eight) hours as needed. Patient not taking: Reported on 04/15/2016 05/26/15   Darci Currentandolph N Brown, MD  oxyCODONE (OXY IR/ROXICODONE) 5 MG immediate release tablet Take 1 tablet (5 mg total) by mouth every 4 (four) hours as needed for moderate pain or severe pain. Patient not taking: Reported on 04/15/2016 04/30/15   Katharina Caperima Vaickute, MD  oxyCODONE-acetaminophen (ROXICET) 5-325 MG tablet Take 1 tablet by  mouth every 6 (six) hours as needed. Patient not taking: Reported on 04/15/2016 07/16/15   Minna AntisKevin Paduchowski, MD  promethazine (PHENERGAN) 25 MG tablet Take 1 tablet (25 mg total) by mouth every 6 (six) hours as needed for nausea or vomiting. Patient not taking: Reported on 04/15/2016 07/16/15   Minna AntisKevin Paduchowski, MD      VITAL SIGNS:  Blood pressure 100/69, pulse (!) 109, temperature 98.9 F (37.2 C), temperature source Oral, resp. rate 15, weight 105 lb (47.6 kg), SpO2 100 %.  PHYSICAL EXAMINATION:  Physical Exam  GENERAL:  40 y.o.-year-old patient lying in the bed with no acute distress.  EYES: Pupils equal, round, reactive to light and accommodation. No scleral icterus. Extraocular muscles intact.  HEENT: Head atraumatic, normocephalic. Oropharynx and nasopharynx clear.  NECK:  Supple, no jugular venous distention. No thyroid enlargement, no tenderness.  LUNGS: Normal breath sounds bilaterally, no wheezing, rales,rhonchi or crepitation. No use of accessory muscles of respiration.  CARDIOVASCULAR: S1, S2 normal. No murmurs, rubs, or gallops.  ABDOMEN: Soft, nontender, nondistended. Bowel sounds present. No organomegaly or mass.  EXTREMITIES: No pedal edema, cyanosis, or clubbing. Sacral DUx2, stage IV. NEUROLOGIC: Cranial nerves II through XII are intact. Paralyzed lower extremities. PSYCHIATRIC: The patient is alert and oriented x 3.  SKIN: No obvious rash, lesion, or ulcer.   LABORATORY PANEL:   CBC  Recent Labs Lab 04/15/16 1025  WBC 13.5*  HGB 8.9*  HCT 28.8*  PLT 688*   ------------------------------------------------------------------------------------------------------------------  Chemistries   Recent Labs Lab 04/15/16 1025  NA 138  K 3.4*  CL 103  CO2 27  GLUCOSE 110*  BUN 12  CREATININE 0.66  CALCIUM 8.4*  AST 19  ALT 12*  ALKPHOS 80  BILITOT <0.1*    ------------------------------------------------------------------------------------------------------------------  Cardiac Enzymes  Recent Labs Lab 04/15/16 1025  TROPONINI <0.03   ------------------------------------------------------------------------------------------------------------------  RADIOLOGY:  Dg Chest Port 1 View  Result Date: 04/15/2016 CLINICAL DATA:  Sepsis. EXAM: PORTABLE CHEST 1 VIEW COMPARISON:  Radiographs of December 24, 2013. FINDINGS: The heart size and mediastinal contours are within normal limits. Both lungs are clear. No pneumothorax or pleural effusion is noted. Status post surgical fixation of lower thoracic spine. IMPRESSION: No acute cardiopulmonary abnormality seen. Electronically Signed   By: Lupita RaiderJames  Green Jr, M.D.   On: 04/15/2016 11:47      IMPRESSION AND PLAN:   Sepsis due to sacral DU infection The patient will be admitted to medical floor. I will start sepsis protocol, vancomycin and Zosyn pharmacy to dose, IV fluids to support, follow-up CBC and blood culture, surgery consult, wound care consult and social worker consult.  Recurrent UTI. Continue antibiotics as above, follow-up cultures.  Thrombocytosis. Due to reaction. Follow-up CBC. Hypokalemia. Give potassium supplement and follow-up BMP.   All the records are reviewed and case discussed with ED provider. Management plans discussed with the patient, family and they are in agreement.  CODE STATUS: Full code  TOTAL TIME TAKING CARE OF THIS PATIENT: 56 minutes.    Jonathon York, Jonathon York M.D on 04/15/2016 at  1:22 PM  Between 7am to 6pm - Pager - [phone]  After 6pm go to [email or web address] - Social research officer, government  [facility]  Hospitalists  Office  [phone]  CC: Primary care physician; [facility]   Note: This dictation was prepared with Dragon dictation along with smaller phrase technology. Any transcriptional errors that result from this process are  unintentional.

## 2016-04-15 NOTE — ED Notes (Signed)
Changed pt BP cuff to small adult from adult. Will continue to monitor BP.  Pt informed still no inpatient bed assignment yet. Pt asked for pillows and lights to be turned down. Pt stated he was just lying on side and now is on back. Informed that this RN doesn't want pt lying on back too long because of pressure ulcers.

## 2016-04-15 NOTE — ED Notes (Signed)
Changed and repositioned pt

## 2016-04-15 NOTE — ED Triage Notes (Signed)
Sore on bottom for a long time, but it appears infected for about 2 weeks with foul smell and drainage.  Also right foot second toe is turning back.

## 2016-04-16 DIAGNOSIS — L89159 Pressure ulcer of sacral region, unspecified stage: Secondary | ICD-10-CM

## 2016-04-16 DIAGNOSIS — F332 Major depressive disorder, recurrent severe without psychotic features: Secondary | ICD-10-CM

## 2016-04-16 LAB — BASIC METABOLIC PANEL
ANION GAP: 3 — AB (ref 5–15)
BUN: 6 mg/dL (ref 6–20)
CHLORIDE: 111 mmol/L (ref 101–111)
CO2: 25 mmol/L (ref 22–32)
Calcium: 7.8 mg/dL — ABNORMAL LOW (ref 8.9–10.3)
Creatinine, Ser: 0.41 mg/dL — ABNORMAL LOW (ref 0.61–1.24)
GFR calc non Af Amer: >60 mL/min (ref >60)
GLUCOSE: 93 mg/dL (ref 65–99)
Potassium: 3.9 mmol/L (ref 3.5–5.1)
Sodium: 139 mmol/L (ref 135–145)

## 2016-04-16 LAB — IRON AND TIBC
Iron: 9 ug/dL — ABNORMAL LOW (ref 45–182)
SATURATION RATIOS: 6 % — AB (ref 17.9–39.5)
TIBC: 158 ug/dL — ABNORMAL LOW (ref 250–450)
UIBC: 149 ug/dL

## 2016-04-16 LAB — CBC
HCT: 24.4 % — ABNORMAL LOW (ref 40.0–52.0)
Hemoglobin: 7.5 g/dL — ABNORMAL LOW (ref 13.0–18.0)
MCH: 19.3 pg — AB (ref 26.0–34.0)
MCHC: 30.7 g/dL — ABNORMAL LOW (ref 32.0–36.0)
MCV: 62.9 fL — ABNORMAL LOW (ref 80.0–100.0)
PLATELETS: 461 10*3/uL — AB (ref 150–440)
RBC: 3.88 MIL/uL — AB (ref 4.40–5.90)
RDW: 19.9 % — ABNORMAL HIGH (ref 11.5–14.5)
WBC: 7.2 10*3/uL (ref 3.8–10.6)

## 2016-04-16 LAB — FERRITIN: FERRITIN: 55 ng/mL (ref 24–336)

## 2016-04-16 MED ORDER — BUPROPION HCL ER (XL) 150 MG PO TB24
150.0000 mg | ORAL_TABLET | Freq: Every day | ORAL | Status: DC
  Administered 2016-04-16 – 2016-04-17 (×2): 150 mg via ORAL
  Filled 2016-04-16 (×2): qty 1

## 2016-04-16 MED ORDER — DAKINS (1/4 STRENGTH) 0.125 % EX SOLN
Freq: Two times a day (BID) | CUTANEOUS | Status: AC
  Administered 2016-04-16 – 2016-04-18 (×6)
  Filled 2016-04-16: qty 473

## 2016-04-16 MED ORDER — ENSURE ENLIVE PO LIQD
237.0000 mL | Freq: Three times a day (TID) | ORAL | Status: DC
  Administered 2016-04-17 – 2016-04-18 (×6): 237 mL via ORAL

## 2016-04-16 MED ORDER — VANCOMYCIN HCL 500 MG IV SOLR
500.0000 mg | Freq: Two times a day (BID) | INTRAVENOUS | Status: DC
  Administered 2016-04-16: 500 mg via INTRAVENOUS
  Filled 2016-04-16 (×3): qty 500

## 2016-04-16 MED ORDER — SODIUM CHLORIDE 0.9 % IV SOLN
500.0000 mg | Freq: Once | INTRAVENOUS | Status: AC
  Administered 2016-04-16: 500 mg via INTRAVENOUS
  Filled 2016-04-16: qty 25

## 2016-04-16 MED ORDER — ENOXAPARIN SODIUM 30 MG/0.3ML ~~LOC~~ SOLN
30.0000 mg | SUBCUTANEOUS | Status: DC
  Filled 2016-04-16: qty 0.3

## 2016-04-16 MED ORDER — ADULT MULTIVITAMIN W/MINERALS CH
1.0000 | ORAL_TABLET | Freq: Every day | ORAL | Status: DC
  Administered 2016-04-16 – 2016-04-18 (×3): 1 via ORAL
  Filled 2016-04-16 (×3): qty 1

## 2016-04-16 NOTE — Progress Notes (Signed)
Initial Nutrition Assessment  DOCUMENTATION CODES:   Underweight  INTERVENTION:  Provide Ensure Enlive po TID, each supplement provides 350 kcal and 20 grams of protein.  Provide multivitamin with minerals to promote wound healing.  NUTRITION DIAGNOSIS:   Increased nutrient needs related to wound healing as evidenced by estimated needs.  GOAL:   Patient will meet greater than or equal to 90% of their needs  MONITOR:   PO intake, Supplement acceptance, Labs, Weight trends, I & O's, Skin  REASON FOR ASSESSMENT:   Malnutrition Screening Tool    ASSESSMENT:   40 y.o. male with a known history of Paraplegia of both legs (per chart secondary to traumatic fall in 2001) presents with fever, chills, rapid heart rate, and foul-smelling drainage from the sacral wounds as well as foul-smelling and copious urine for a few weeks, worsening for a couple days. Patient found to have sepsis due to UTI and chronic decubitus ulcer.   -Noted in chart that patient reports he is depressed and asking for psychiatry consult.  Attempted to speak with patient at bedside. Patient very agitated and easily frustrated by RD visit. He reports he has a poor appetite at baseline. He feels hunger but experiences early satiety after 4-5 bites. Denies N/V, abdominal pain, constipation/diarrhea. Patient reports eating at least 1 meal per day (sandwich). When attempted to clarify intake he reported "I don't know I just eat whatever I have." He has taken Centrum multivitamin and Ensure/Boost in the past. Patient reports he has had these same wounds for approximately 5 years and has seen many RDs before. He reports they all tell him to eat calories and protein but they do not understand what it is like to be paralyzed. Provided active listening to patient. Attempted to help patient with nutrition plan to meet kcal/protein needs and address his early satiety, but he refused reporting "I don't want just  recommendations."  Patient reports his UBW was 155 lbs 16 years ago (presumably prior to accident leading to paraplegia). He did not provide any recent weight history upon asking. Per chart his weight has fluctuated between 39.1 kg and 45.4 kg this past year. He did report that someone told him he needed to gain 40 lbs before he could have the surgery for his wounds.   Meal Completion: 100% of lunch today per chart. Patient had chicken breast, macaroni and cheese, green beans, dinner roll, ice cream, and Sprite (795 kcal and 39 grams of protein).   Medications reviewed and include: Zosyn, vancomycin.  Labs reviewed: Creatinine 0.41, Anion gap 3.   Unable to complete Nutrition-Focused physical exam at this time as patient refusing.   Discussed with RN. She reports patient is strong as he can lift himself with arms for dressing changes.   Diet Order:  Diet regular Room service appropriate? Yes; Fluid consistency: Thin  Skin:  Wound (see comment) (Stg IV to buttocks, Stg III L ischial tuberosity, US L toe)  Last BM:  04/14/2016  Height:   Ht Readings from Last 1 Encounters:  04/15/16 5\' 9"  (1.753 m)    Weight:   Wt Readings from Last 1 Encounters:  04/15/16 94 lb 2.2 oz (42.7 kg)    Ideal Body Weight:  72.7 kg  BMI:  Body mass index is 13.9 kg/m.  Estimated Nutritional Needs:   Kcal:  1495-1708 (35-40 kcal/kg)  Protein:  64-85 grams (1.5-2 grams/kg)  Fluid:  >/= 1.5 L/day  EDUCATION NEEDS:   Education needs no appropriate at this time  Willey Blade, MS, RD, LDN Pager: [phone] After Hours Pager: [phone]

## 2016-04-16 NOTE — Clinical Social Work Note (Signed)
Clinical Social Work Assessment  Patient Details  Name: Jonathon York MRN: [id number] Date of Birth: 12-07-1975  Date of referral:  04/16/16               Reason for consult:  Facility Placement                Permission sought to share information with:  Facility Industrial/product designerContact Representative Permission granted to share information::  Yes, Verbal Permission Granted  Name::        Agency::     Relationship::     Contact Information:     Housing/Transportation Living arrangements for the past 2 months:  Apartment Source of Information:  Patient Patient Interpreter Needed:  None Criminal Activity/Legal Involvement Pertinent to Current Situation/Hospitalization:  No - Comment as needed Significant Relationships:  None Lives with:  Self Do you feel safe going back to the place where you live?  Yes Need for family participation in patient care:  Yes (Comment)  Care giving concerns:  Patient resides alone and is paraplegic.   Social Worker assessment / plan:  CSW spoke with patient and introduced self/role and purpose. CSW discussed that the physician had consulted CSW to discuss possible nursing home placement for wound care but did not know if he wanted to try and continue to care for himself at home. Patient stated that he was obviously not able to care for himself as he has gotten sick from his wounds and cannot care for his wounds any longer. Patient stated that the physician discussed LTAC placement until he could regain more nutrition and clear the infection so he could receive a skin flap and advanced wound care. CSW informed patient that the RN CM could look into that for him. Patient stated he did not want [place] [facility] [place] as he has been there twice before and had "problems." Patient stated he would consider [facility]. CSW informed patient that if the LTAC option did not work out, that CSW could revisit the option of nursing home placement for wound care. Patient  agreed to this and RN CM has been notified.  Employment status:  Disabled (Comment on whether or not currently receiving Disability) Insurance information:  [place] Recommendations:  Not assessed at this time Information / Referral to community resources:     Patient/Family's Response to care:  Patient initially was irritated at CSW visit but by end of visit was pleasant and expressed appreciation for CSW visit.  Patient/Family's Understanding of and Emotional Response to Diagnosis, Current Treatment, and Prognosis:  Patient wishes to pursue LTAC level of care as he has been through this in the past.   Emotional Assessment Appearance:  Appears stated age Attitude/Demeanor/Rapport:  Other (cooperative but irritable) Affect (typically observed):  Irritable, Calm, Frustrated Orientation:  Oriented to Self, Oriented to Place, Oriented to  Time, Oriented to Situation Alcohol / Substance use:  Not Applicable Psych involvement (Current and /or in the community):  No (Comment)  Discharge Needs  Concerns to be addressed:  Care Coordination Readmission within the last 30 days:  No Current discharge risk:  None Barriers to Discharge:  No Barriers Identified   [name], LCSW 04/16/2016, 2:12 PM

## 2016-04-16 NOTE — Progress Notes (Signed)
Confirmation for sizewize mattress is [id number]

## 2016-04-16 NOTE — Consult Note (Signed)
[facility] Consult   Reason for Consult:  Consult for 40 year old man with history of paraplegia and depression. Referring Physician:  Posey Pronto [name] MRN:  [id number] Principal Diagnosis: Severe recurrent major depression without psychotic features [facility]) Diagnosis:   Patient Active Problem List   Diagnosis Date Noted  . Severe recurrent major depression without psychotic features ([place]) [F33.2] 04/16/2016  . Decubitus ulcer of sacral region [L89.159]   . Sepsis ([place]) [A41.9] 04/15/2016  . Amputation of fourth toe, right, traumatic ([place]) [J03.009Q] 04/30/2015  . Pressure ulcer stage III [L89.93] 04/30/2015  . Sterile pyuria [N39.0] 04/30/2015  . Paraplegia ([place]) [G82.20] 04/30/2015  . Neurogenic bladder [N31.9] 04/30/2015  . 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 Malnutrition of moderate degree [E44.0] 04/28/2015    Total Time spent with patient: 1 hour  Subjective:   Jonathon York is a 40 y.o. male patient admitted with "I've been real sick".  HPI:  Patient interviewed. Chart reviewed. This is a 40 year old man with paraplegia in the hospital with worsening of his decubitus ulcers and sepsis. Concern raised about depression. Patient tells me that he stays depressed most of the time and has been for probably years. It comes and goes but recently has been worse. He feels tired and run down and hopeless and sleeps all the time. His appetite is poor. He says he has intermittent suicidal thoughts. Does not have any current plan or intent to act on it. He has a ongoing use of marijuana. Not on any medicine however currently for depression and hasn't been for years. Major stresses are living alone social isolation severe medical problems  Medical history: Patient is paraplegic. Has had a decubitus ulcer that has not healed for a long time. Came into the hospital with sepsis as well.  Social history: Lives by himself. He has sort  of run off all of his family. He has daughters and other family but doesn't get along very well with them. Stays socially isolated.  Substance abuse history: Admits that he uses marijuana regularly. Doesn't use any other drugs currently doesn't drink.  Past Psychiatric History: Patient has a history of recurrent depression. History of suicidal ideation. Says that he's tried to kill himself "many times" in the past. Doesn't give much detail about it. Last time I saw the patient was about 4 years ago when he was depressed then. He hasn't been on antidepressants in the long time. Doesn't remember whether they ever really worked.  Risk to Self: Is patient at risk for suicide?: No Risk to Others:   Prior Inpatient Therapy:   Prior Outpatient Therapy:    Past Medical History:  Past Medical History:  Diagnosis Date  . Paraparesis of both lower limbs ([place]) 02/21/00    Past Surgical History:  Procedure Laterality Date  . AMPUTATION TOE Right 04/29/2015   Procedure: AMPUTATION TOE;  Surgeon: Sharlotte Alamo, MD;  Location: [facility] ORS;  Service: Podiatry;  Laterality: Right;  . IRRIGATION AND DEBRIDEMENT BUTTOCKS    . TOE AMPUTATION Right    Family History:  Family History  Problem Relation Age of Onset  . Lymphoma Sister    Family Psychiatric  History: None Social History:  History  Alcohol Use No     History  Drug Use  . Types: Marijuana    Social History   Social History  . Marital status: Single    Spouse name: N/A  . Number of children: N/A  . Years of education:  N/A   Social History Main Topics  . Smoking status: Former Smoker    Packs/day: 0.00    Types: Cigarettes  . Smokeless tobacco: Never Used  . Alcohol use No  . Drug use:     Types: Marijuana  . Sexual activity: Not Asked   Other Topics Concern  . None   Social History Narrative  . None   Additional Social History:    Allergies:  No Known Allergies  Labs:  Results for orders placed or performed during the  hospital encounter of 04/15/16 (from the past 48 hour(s))  CBC     Status: Abnormal   Collection Time: 04/15/16 10:25 AM  Result Value Ref Range   WBC 13.5 (H) 3.8 - 10.6 K/uL   RBC 4.74 4.40 - 5.90 MIL/uL   Hemoglobin 8.9 (L) 13.0 - 18.0 g/dL   HCT 28.8 (L) 40.0 - 52.0 %   MCV 60.8 (L) 80.0 - 100.0 fL   MCH 18.8 (L) 26.0 - 34.0 pg   MCHC 30.9 (L) 32.0 - 36.0 g/dL   RDW 19.9 (H) 11.5 - 14.5 %   Platelets 688 (H) 150 - 440 K/uL  Basic metabolic panel     Status: Abnormal   Collection Time: 04/15/16 10:25 AM  Result Value Ref Range   Sodium 138 135 - 145 mmol/L   Potassium 3.4 (L) 3.5 - 5.1 mmol/L   Chloride 103 101 - 111 mmol/L   CO2 27 22 - 32 mmol/L   Glucose, Bld 110 (H) 65 - 99 mg/dL   BUN 12 6 - 20 mg/dL   Creatinine, Ser 0.66 0.61 - 1.24 mg/dL   Calcium 8.4 (L) 8.9 - 10.3 mg/dL   GFR calc non Af Amer >60 >60 mL/min   GFR calc Af Amer >60 >60 mL/min    Comment: (NOTE) The eGFR has been calculated using the CKD EPI equation. This calculation has not been validated in all clinical situations. eGFR's persistently <60 mL/min signify possible Chronic Kidney Disease.    Anion gap 8 5 - 15  Hepatic function panel     Status: Abnormal   Collection Time: 04/15/16 10:25 AM  Result Value Ref Range   Total Protein 7.7 6.5 - 8.1 g/dL   Albumin 2.5 (L) 3.5 - 5.0 g/dL   AST 19 15 - 41 U/L   ALT 12 (L) 17 - 63 U/L   Alkaline Phosphatase 80 38 - 126 U/L   Total Bilirubin <0.1 (L) 0.3 - 1.2 mg/dL   Bilirubin, Direct <0.1 (L) 0.1 - 0.5 mg/dL   Indirect Bilirubin NOT CALCULATED 0.3 - 0.9 mg/dL  Lipase, blood     Status: None   Collection Time: 04/15/16 10:25 AM  Result Value Ref Range   Lipase 17 11 - 51 U/L  Troponin I     Status: None   Collection Time: 04/15/16 10:25 AM  Result Value Ref Range   Troponin I <0.03 <0.03 ng/mL  Protime-INR     Status: None   Collection Time: 04/15/16 10:25 AM  Result Value Ref Range   Prothrombin Time 14.4 11.4 - 15.2 seconds   INR 1.11     Differential     Status: Abnormal   Collection Time: 04/15/16 10:25 AM  Result Value Ref Range   Neutrophils Relative % 75 %   Neutro Abs 10.3 (H) 1.4 - 6.5 K/uL   Lymphocytes Relative 16 %   Lymphs Abs 2.1 1.0 - 3.6 K/uL   Monocytes Relative 7 %  Monocytes Absolute 0.9 0.2 - 1.0 K/uL   Eosinophils Relative 1 %   Eosinophils Absolute 0.1 0 - 0.7 K/uL   Basophils Relative 1 %   Basophils Absolute 0.1 0 - 0.1 K/uL  Aerobic/Anaerobic Culture (surgical/deep wound)     Status: None (Preliminary result)   Collection Time: 04/15/16 11:37 AM  Result Value Ref Range   Specimen Description SACRAL    Special Requests Normal    Gram Stain      RARE WBC PRESENT, PREDOMINANTLY PMN FEW GRAM POSITIVE COCCI IN PAIRS AND CHAINS RARE GRAM POSITIVE RODS RARE GRAM NEGATIVE RODS    Culture      FEW STAPHYLOCOCCUS AUREUS CULTURE REINCUBATED FOR BETTER GROWTH Performed at [facility] [place]    Report Status PENDING   Lactic acid, plasma     Status: None   Collection Time: 04/15/16 11:37 AM  Result Value Ref Range   Lactic Acid, Venous 1.1 0.5 - 1.9 mmol/L  Blood Culture (routine x 2)     Status: None (Preliminary result)   Collection Time: 04/15/16 11:37 AM  Result Value Ref Range   Specimen Description BLOOD RIGHT ROREARM    Special Requests      BOTTLES DRAWN AEROBIC AND ANAEROBIC AER 15ML ANA 16ML   Culture NO GROWTH < 24 HOURS    Report Status PENDING   Blood Culture (routine x 2)     Status: None (Preliminary result)   Collection Time: 04/15/16 11:50 AM  Result Value Ref Range   Specimen Description BLOOD LEFT AC    Special Requests      BOTTLES DRAWN AEROBIC AND ANAEROBIC AER 16ML ANA 19ML   Culture NO GROWTH < 24 HOURS    Report Status PENDING   Urinalysis, Complete w Microscopic     Status: Abnormal   Collection Time: 04/15/16 12:04 PM  Result Value Ref Range   Color, Urine YELLOW (A) YELLOW   APPearance CLOUDY (A) CLEAR   Specific Gravity, Urine 1.023 1.005 - 1.030   pH  6.0 5.0 - 8.0   Glucose, UA NEGATIVE NEGATIVE mg/dL   Hgb urine dipstick NEGATIVE NEGATIVE   Bilirubin Urine NEGATIVE NEGATIVE   Ketones, ur NEGATIVE NEGATIVE mg/dL   Protein, ur 30 (A) NEGATIVE mg/dL   Nitrite POSITIVE (A) NEGATIVE   Leukocytes, UA MODERATE (A) NEGATIVE   RBC / HPF 0-5 0 - 5 RBC/hpf   WBC, UA TOO NUMEROUS TO COUNT 0 - 5 WBC/hpf   Bacteria, UA FEW (A) NONE SEEN   Squamous Epithelial / LPF 0-5 (A) NONE SEEN   Mucous PRESENT   Urine culture     Status: Abnormal (Preliminary result)   Collection Time: 04/15/16 12:04 PM  Result Value Ref Range   Specimen Description URINE, RANDOM    Special Requests Normal    Culture >=100,000 COLONIES/mL ESCHERICHIA COLI (A)    Report Status PENDING   Magnesium     Status: None   Collection Time: 04/15/16  6:12 PM  Result Value Ref Range   Magnesium 2.0 1.7 - 2.4 mg/dL  Procalcitonin     Status: None   Collection Time: 04/15/16  6:12 PM  Result Value Ref Range   Procalcitonin <0.10 ng/mL    Comment:        Interpretation: PCT (Procalcitonin) <= 0.5 ng/mL: Systemic infection (sepsis) is not likely. Local bacterial infection is possible. (NOTE)         ICU PCT Algorithm  Non ICU PCT Algorithm    ----------------------------     ------------------------------         PCT < 0.25 ng/mL                 PCT < 0.1 ng/mL     Stopping of antibiotics            Stopping of antibiotics       strongly encouraged.               strongly encouraged.    ----------------------------     ------------------------------       PCT level decrease by               PCT < 0.25 ng/mL       >= 80% from peak PCT       OR PCT 0.25 - 0.5 ng/mL          Stopping of antibiotics                                             encouraged.     Stopping of antibiotics           encouraged.    ----------------------------     ------------------------------       PCT level decrease by              PCT >= 0.25 ng/mL       < 80% from peak PCT         AND PCT >= 0.5 ng/mL            Continuin g antibiotics                                              encouraged.       Continuing antibiotics            encouraged.    ----------------------------     ------------------------------     PCT level increase compared          PCT > 0.5 ng/mL         with peak PCT AND          PCT >= 0.5 ng/mL             Escalation of antibiotics                                          strongly encouraged.      Escalation of antibiotics        strongly encouraged.   Protime-INR     Status: None   Collection Time: 04/15/16  6:12 PM  Result Value Ref Range   Prothrombin Time 15.0 11.4 - 15.2 seconds   INR 1.17   APTT     Status: None   Collection Time: 04/15/16  6:12 PM  Result Value Ref Range   aPTT 35 24 - 36 seconds  MRSA PCR Screening     Status: None   Collection Time: 04/15/16  6:28 PM  Result Value Ref Range   MRSA by PCR NEGATIVE NEGATIVE    Comment:  The GeneXpert MRSA Assay (FDA approved for NASAL specimens only), is one component of a comprehensive MRSA colonization surveillance program. It is not intended to diagnose MRSA infection nor to guide or monitor treatment for MRSA infections.   Basic metabolic panel     Status: Abnormal   Collection Time: 04/16/16  5:03 AM  Result Value Ref Range   Sodium 139 135 - 145 mmol/L   Potassium 3.9 3.5 - 5.1 mmol/L   Chloride 111 101 - 111 mmol/L   CO2 25 22 - 32 mmol/L   Glucose, Bld 93 65 - 99 mg/dL   BUN 6 6 - 20 mg/dL   Creatinine, Ser 0.41 (L) 0.61 - 1.24 mg/dL   Calcium 7.8 (L) 8.9 - 10.3 mg/dL   GFR calc non Af Amer >60 >60 mL/min   GFR calc Af Amer >60 >60 mL/min    Comment: (NOTE) The eGFR has been calculated using the CKD EPI equation. This calculation has not been validated in all clinical situations. eGFR's persistently <60 mL/min signify possible Chronic Kidney Disease.    Anion gap 3 (L) 5 - 15  CBC     Status: Abnormal   Collection Time: 04/16/16  5:03 AM    Result Value Ref Range   WBC 7.2 3.8 - 10.6 K/uL   RBC 3.88 (L) 4.40 - 5.90 MIL/uL   Hemoglobin 7.5 (L) 13.0 - 18.0 g/dL   HCT 24.4 (L) 40.0 - 52.0 %   MCV 62.9 (L) 80.0 - 100.0 fL   MCH 19.3 (L) 26.0 - 34.0 pg   MCHC 30.7 (L) 32.0 - 36.0 g/dL   RDW 19.9 (H) 11.5 - 14.5 %   Platelets 461 (H) 150 - 440 K/uL  Ferritin     Status: None   Collection Time: 04/16/16  5:03 AM  Result Value Ref Range   Ferritin 55 24 - 336 ng/mL  Iron and TIBC     Status: Abnormal   Collection Time: 04/16/16  5:03 AM  Result Value Ref Range   Iron 9 (L) 45 - 182 ug/dL   TIBC 158 (L) 250 - 450 ug/dL   Saturation Ratios 6 (L) 17.9 - 39.5 %   UIBC 149 ug/dL    Current Facility-Administered Medications  Medication Dose Route Frequency Provider Last Rate Last Dose  . acetaminophen (TYLENOL) tablet 650 mg  650 mg Oral Q6H PRN Demetrios Loll, MD       Or  . acetaminophen (TYLENOL) suppository 650 mg  650 mg Rectal Q6H PRN Demetrios Loll, MD      . albuterol (PROVENTIL) (2.5 MG/3ML) 0.083% nebulizer solution 2.5 mg  2.5 mg Nebulization Q2H PRN Demetrios Loll, MD      . bisacodyl (DULCOLAX) EC tablet 5 mg  5 mg Oral Daily PRN Demetrios Loll, MD      . diphenhydrAMINE (BENADRYL) capsule 25 mg  25 mg Oral Q6H PRN Gladstone Lighter, MD   25 mg at 04/15/16 2055  . enoxaparin (LOVENOX) injection 30 mg  30 mg Subcutaneous Q24H Dustin Flock, MD      . feeding supplement (ENSURE ENLIVE) (ENSURE ENLIVE) liquid 237 mL  237 mL Oral TID BM Dustin Flock, MD      . HYDROcodone-acetaminophen (NORCO/VICODIN) 5-325 MG per tablet 1-2 tablet  1-2 tablet Oral Q4H PRN Demetrios Loll, MD   2 tablet at 04/16/16 1023  . iron sucrose (VENOFER) 500 mg in sodium chloride 0.9 % 250 mL IVPB  500 mg Intravenous Once Dustin Flock, MD  500 mg at 04/16/16 1741  . multivitamin with minerals tablet 1 tablet  1 tablet Oral Daily Dustin Flock, MD   1 tablet at 04/16/16 1750  . ondansetron (ZOFRAN) tablet 4 mg  4 mg Oral Q6H PRN Demetrios Loll, MD       Or  .  ondansetron [facility]) injection 4 mg  4 mg Intravenous Q6H PRN Demetrios Loll, MD      . oxybutynin (DITROPAN-XL) 24 hr tablet 15 mg  15 mg Oral BID Gladstone Lighter, MD   15 mg at 04/16/16 0810  . piperacillin-tazobactam (ZOSYN) IVPB 3.375 g  3.375 g Intravenous Q8H Melissa D Maccia, RPH   3.375 g at 04/16/16 1315  . sodium hypochlorite (DAKIN'S 1/4 STRENGTH) topical solution   Irrigation BID Dustin Flock, MD      . vancomycin (VANCOCIN) 500 mg in sodium chloride 0.9 % 100 mL IVPB  500 mg Intravenous Q12H Demetrios Loll, MD        Musculoskeletal: Strength & Muscle Tone: decreased Gait & Station: unable to stand Patient leans: N/A  Psychiatric Specialty Exam: Physical Exam  Nursing note and vitals reviewed. Constitutional: He appears well-developed.  HENT:  Head: Normocephalic and atraumatic.  Eyes: Conjunctivae are normal. Pupils are equal, round, and reactive to light.  Neck: Normal range of motion.  Cardiovascular: Normal heart sounds.   Respiratory: Effort normal.  GI: Soft.  Musculoskeletal:       Legs: Neurological: He is alert.  Skin: Skin is warm and dry.     Psychiatric: Judgment normal. His speech is delayed. He is slowed. Cognition and memory are normal. He exhibits a depressed mood. He expresses suicidal ideation. He expresses no suicidal plans.    Review of Systems  Constitutional: Positive for fever, malaise/fatigue and weight loss.  HENT: Negative.   Eyes: Negative.   Respiratory: Negative.   Cardiovascular: Negative.   Gastrointestinal: Negative.   Musculoskeletal: Positive for back pain.  Skin: Negative.   Neurological: Negative.   Psychiatric/Behavioral: Positive for depression, substance abuse and suicidal ideas. Negative for hallucinations and memory loss. The patient is not nervous/anxious and does not have insomnia.     Blood pressure 102/68, pulse 78, temperature 97.8 F (36.6 C), temperature source Oral, resp. rate 18, height 5' 9" (1.753 m), weight 42.7  kg (94 lb 2.2 oz), SpO2 100 %.Body mass index is 13.9 kg/m.  General Appearance: Casual  Eye Contact:  Fair  Speech:  Normal Rate  Volume:  Decreased  Mood:  Depressed  Affect:  Depressed  Thought Process:  Goal Directed  Orientation:  Full (Time, Place, and Person)  Thought Content:  Logical  Suicidal Thoughts:  Yes.  without intent/plan  Homicidal Thoughts:  No  Memory:  Immediate;   Good Recent;   Fair Remote;   Fair  Judgement:  Fair  Insight:  Fair  Psychomotor Activity:  Decreased  Concentration:  Concentration: Fair  Recall:  [employer] of Knowledge:  Fair  Language:  Fair  Akathisia:  No  Handed:  Right  AIMS (if indicated):     Assets:  Communication Skills Desire for Improvement Housing Resilience  ADL's:  Intact  Cognition:  WNL  Sleep:        Treatment Plan Summary: Daily contact with patient to assess and evaluate symptoms and progress in treatment, Medication management and Plan Patient with major depression severe recurrent no psychotic features. Chronic suicidal ideation without any active intent or plan. Really needs to be back on medication and  getting active treatment. Patient also has some chronic problems with social functioning that make it even harder for him to get better. Has very little support outside the hospital. I am going to restart him on Wellbutrin 150 mg once a day. This can be increased to 300 in a few days. I will follow-up as needed.  Disposition: Patient does not meet criteria for psychiatric inpatient admission. Supportive therapy provided about ongoing stressors.  Alethia Berthold, MD 04/16/2016 6:33 PM

## 2016-04-16 NOTE — Consult Note (Signed)
Pharmacy Antibiotic Note  Jonathon York is a 89[name]1 y.o. male admitted on 04/15/2016 with sepsis.  Pharmacy has been consulted for vancomycin and Zosyn dosing. Pt has sacral wound with discharge, and burning upon urination  Plan: Pt received 1g of vancomycin in the ED. Will give next dose in 8 hours for stacked dosing.  Vancomycin 750 IV every 12 hours.  Goal trough 15-20 mcg/mL. Zosyn 3.375g IV q8h (4 hour infusion).  Trough prior to 5th dose 12/20 @ 0830 Doses calculated using a crcl of 82.336ml/min using height of 70 inch found in care everywhere.  Pt ht in chart as 69 inches. CrCl 74.1 ml/min. Will decrease dose to vancomycin 500 mg IV q12h as current dosing likely to result in trough >20. Ke 0.066, half life 10.5 h, Vd 30 L Will continue with trough prior to 5th overall dose 12/20 @ 0830 to see where we are (will not be at [facility]).    Height: 5\' 9"  (175.3 cm) Weight: 94 lb 2.2 oz (42.7 kg) IBW/kg (Calculated) : 70.7  Temp (24hrs), Avg:98 F (36.7 C), Min:97.8 F (36.6 C), Max:98.4 F (36.9 C)   Recent Labs Lab 04/15/16 1025 04/15/16 1137 04/16/16 0503  WBC 13.5*  --  7.2  CREATININE 0.66  --  0.41*  LATICACIDVEN  --  1.1  --     Estimated Creatinine Clearance: 74.1 mL/min (by C-G formula based on SCr of 0.41 mg/dL (L)).    No Known Allergies  Antimicrobials this admission: vancomycin 12/18 >>  zosyn 12/18 >>   Dose adjustments this admission:   Microbiology results: 12/18 BCx: NGTD x2 12/18 UCx: sent  12/18 MRSA PCR: neg 12/18 WCx: few staph aureus, reincubated for better growth - sensitivities pending  Thank you for allowing pharmacy to be a part of this patient's care.  Crist FatHannah Devron Cohick, PharmD, BCPS Clinical Pharmacist 04/16/2016 11:31 AM

## 2016-04-16 NOTE — Consult Note (Signed)
Patient ID: Jonathon York, male   DOB: [birth year], 40 y.o.   MRN: [id number]  CC: Decubitus ulcers  HPI Jonathon LeedsRichard L Bonneville is a 40 y.o. male currently admitted to the medicine service for treatment of a urinary tract infection and likely wound infection. Surgical consult was requested by Dr. Imogene Burnhen for further evaluation of his wounds. Patient reports that he has had these wounds for a long time secondary to his paraplegia. He states it started off as one ulcer that was treated with a wound VAC by some other provider which then lead to breakdown of a different area of skin around his sacrum. He has been seen and treated by numerous providers at numerous hospitals for this problem. He states he came to the hospital because of a burning sensation with urination as well as incontinence. He also has noted a dark drainage from one of his sacral wounds which made him think it was infected. He is primarily bedbound and wheelchair-bound.  HPI  Past Medical History:  Diagnosis Date  . 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 AMPUTATION TOE Right 04/29/2015   Procedure: AMPUTATION TOE;  Surgeon: Linus Galasodd Cline, MD;  Location: [facility] ORS;  Service: Podiatry;  Laterality: Right;  . IRRIGATION AND DEBRIDEMENT BUTTOCKS    . TOE AMPUTATION Right     Family History  Problem Relation Age of Onset  . Lymphoma Sister     Social History Social History  Substance Use Topics  . Smoking status: Former Smoker    Packs/day: 0.00    Types: Cigarettes  . Smokeless tobacco: Never Used  . Alcohol use No    No Known Allergies  Current Facility-Administered Medications  Medication Dose Route Frequency Provider Last Rate Last Dose  . 0.9 % NaCl with KCl 20 mEq/ L  infusion   Intravenous Continuous Shaune PollackQing Chen, MD 100 mL/hr at 04/16/16 0550    . acetaminophen (TYLENOL) tablet 650 mg  650 mg Oral Q6H PRN Shaune PollackQing Chen, MD       Or  . acetaminophen (TYLENOL)  suppository 650 mg  650 mg Rectal Q6H PRN Shaune PollackQing Chen, MD      . albuterol (PROVENTIL) (2.5 MG/3ML) 0.083% nebulizer solution 2.5 mg  2.5 mg Nebulization Q2H PRN Shaune PollackQing Chen, MD      . bisacodyl (DULCOLAX) EC tablet 5 mg  5 mg Oral Daily PRN Shaune PollackQing Chen, MD      . diphenhydrAMINE (BENADRYL) capsule 25 mg  25 mg Oral Q6H PRN Enid Baasadhika Kalisetti, MD   25 mg at 04/15/16 2055  . enoxaparin (LOVENOX) injection 30 mg  30 mg Subcutaneous Q24H Auburn BilberryShreyang Patel, MD      . HYDROcodone-acetaminophen (NORCO/VICODIN) 5-325 MG per tablet 1-2 tablet  1-2 tablet Oral Q4H PRN Shaune PollackQing Chen, MD   1 tablet at 04/16/16 0538  . ondansetron (ZOFRAN) tablet 4 mg  4 mg Oral Q6H PRN Shaune PollackQing Chen, MD       Or  . ondansetron [facility](ZOFRAN) injection 4 mg  4 mg Intravenous Q6H PRN Shaune PollackQing Chen, MD      . oxybutynin (DITROPAN-XL) 24 hr tablet 15 mg  15 mg Oral BID Enid Baasadhika Kalisetti, MD   15 mg at 04/16/16 0810  . piperacillin-tazobactam (ZOSYN) IVPB 3.375 g  3.375 g Intravenous Q8H Melissa D Maccia, RPH   3.375 g at 04/16/16 0446  . sodium hypochlorite (DAKIN'S 1/4 STRENGTH) topical solution   Irrigation BID Auburn BilberryShreyang Patel, MD      .  vancomycin (VANCOCIN) IVPB 750 mg/150 ml premix  750 mg Intravenous Q12H Olene FlossMelissa D Maccia, RPH   750 mg at 04/16/16 [id number]     Review of Systems A Multi-point review of systems was asked and was negative except for the findings documented in the history of present illness  Physical Exam Blood pressure 104/69, pulse 80, temperature 97.8 F (36.6 C), temperature source Oral, resp. rate 20, height 5\' 9"  (1.753 m), weight 42.7 kg (94 lb 2.2 oz), SpO2 100 %. CONSTITUTIONAL: No acute distress. EYES: Pupils are equal, round, and reactive to light, Sclera are non-icteric. EARS, NOSE, MOUTH AND THROAT: The oropharynx is clear. The oral mucosa is pink and moist. Hearing is intact to voice. LYMPH NODES:  Lymph nodes in the neck are normal. RESPIRATORY:  Lungs are clear. There is normal respiratory effort, with equal breath  sounds bilaterally, and without pathologic use of accessory muscles. CARDIOVASCULAR: Heart is regular without murmurs, gallops, or rubs. GI: The abdomen is soft, nontender, and nondistended. There are no palpable masses. There is no hepatosplenomegaly. There are normal bowel sounds in all quadrants. GU: Rectal deferred.   MUSCULOSKELETAL: Paraplegic   SKIN: Multiple areas of skin breakdown. The largest measures approximately 9 x 12 cm over the sacrum with an additional 2 x 3 7 very over the coccyx. The majority of the wound is pale pink however at the most left lateral aspect of the larger wound does show some area of darker tissue consistent with tissue breakdown. NEUROLOGIC:cranial nerves are grossly intact. PSYCH:  Oriented to person, place and time. Affect is normal.  Data Reviewed Labs reviewed which shows a white blood cell count 7.2, H&H is 7.5 over 24.4, urinalysis consistent with urinary tract infection with visualized bacteria, leukocytes, nitrites, white blood cells. I have personally reviewed the patient's imaging, laboratory findings and medical records.    Assessment     sacral and coccygeal decubitus ulcers    Plan    40 year old male with sacral and coccygeal decubitus ulcers. Agree with recommendations for wound care provider for using alginate and Dakin solution for these wounds. No current indication for urgent surgical intervention. Once infection can be cleared up discussed the possibility of being seen by a tertiary care center for advanced wound care with likely flap placement versus some other type of plastic procedure. Please call again at the Gen. surgery service can be of further assistance with this patient.     Time spent with the patient was 55 minutes, with more than 50% of the time spent in face-to-face education, counseling and care coordination.     Ricarda Frameharles Gissela Bloch, MD FACS General Surgeon 04/16/2016, 9:32 AM

## 2016-04-16 NOTE — Consult Note (Signed)
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 Last seen by my partner, K. Sanders approximately 1 year ago on 04/28/15. The sacral ulcer is larger than on that date and the left ischial tuberosity ulcer is approximately the same size. Patient reports that he uses salt and vinegar solution for self care at home. Wound type:Pressure Pressure Ulcer POA: Yes Measurement:  Sacral:  9cm x 12cm x 0.6cm with dark purple/maroon area in center over coccyx measuring 2cm x 3cm. Pale pink wound bed with large amount of thick light yellow exudate, musty odor. Left ischial tuberosity:  5cm x 5cm x 0.4cm withy pale pink wound bed and moderate amount of thick light yellow exudate, urine odor detected. Wound borders on but not not connect to sacral wound. Closed wound edges from 5-9 o'clock (epibole). Wound bed: As described above Drainage (amount, consistency, odor) As described above Periwound: Intact, macerated (foam dressing in place until my assessment could be made this morning) Dressing procedure/placement/frequency: I will provide a mattress replacement with low air loss feature for patient today; he has a pressure redistribution cushion of high quality for his wheelchair that this is where I believe he spends the majority of his time. Sits in bed often (albeit not to sleep, he reports) and patient is reminded that the sitting position is the causative position for ischial tuberosity pressure injuries.  Patient indicates understanding. I will provide Nursing with guidance via the Orders for three days of twice daily dressing changes with gauze moistened with sodium hypochlorite (1/4 strength Dakin's solution) to regain control of the bioburden that appears to be present.  We will follow this with topical application of silver hydrofiber (Aquacel Ag+) dressings.  Patient reports that he would like to pursue 30 days in  a Rehab facility to get assistance with wound care and nutrition, then see if he could get a graft or flap performed. I will leave those plans and arrangements for further discussion with Hospitalist, CSW, and Case Management. WOC nursing team will not follow, but will remain available to this patient, the nursing and medical teams.  Please re-consult if needed. Thanks, Ladona MowLaurie Oval Cavazos, MSN, RN, GNP, Hans EdenCWOCN, CWON-AP, FAAN  Pager# [phone]

## 2016-04-16 NOTE — Progress Notes (Signed)
Pharmacist - Prescriber Communication  Enoxaparin 40 mg subcutaneously daily changed to enoxaparin 30 mg subcutaneously daily due to low body weight.  Kareem Cathey A. [place], VermontPharm.D., BCPS Clinical Pharmacist 04/16/2016 [phone]

## 2016-04-16 NOTE — Progress Notes (Signed)
[employer] - [place] [facility]                                                                                                                                                                                  Patient Demographics   Jonathon York, is a 40 y.o. male, DOB - 02-02-1976, VWU:[id number]  Admit date - 04/15/2016   Admitting Physician Shaune PollackQing Chen, MD  Outpatient Primary MD for the patient is [facility]   LOS - 1  Subjective: Patient feels depressed requesting psychiatry evaluation Complains of back pain    Review of Systems:   CONSTITUTIONAL: No documented fever. No fatigue, weakness. No weight gain, no weight loss.  EYES: No blurry or double vision.  ENT: No tinnitus. No postnasal drip. No redness of the oropharynx.  RESPIRATORY: No cough, no wheeze, no hemoptysis. No dyspnea.  CARDIOVASCULAR: No chest pain. No orthopnea. No palpitations. No syncope.  GASTROINTESTINAL: No nausea, no vomiting or diarrhea. No abdominal pain. No melena or hematochezia.  GENITOURINARY: No dysuria or hematuria.  ENDOCRINE: No polyuria or nocturia. No heat or cold intolerance.  HEMATOLOGY: No anemia. No bruising. No bleeding.  INTEGUMENTARY: No rashes. No lesions.  MUSCULOSKELETAL: No arthritis. No swelling. No gout. Positive back pain NEUROLOGIC: No numbness, tingling, or ataxia. No seizure-type activity.  PSYCHIATRIC: No anxiety. No insomnia. No ADD. Positive depression   Vitals:   Vitals:   04/15/16 2059 04/16/16 0540 04/16/16 0748 04/16/16 1214  BP: (!) 99/59 (!) 96/51 104/69 102/68  Pulse: 89 78 80 78  Resp:   20 18  Temp:  97.9 F (36.6 C) 97.8 F (36.6 C) 97.8 F (36.6 C)  TempSrc:  Oral Oral Oral  SpO2:  100% 100% 100%  Weight:      Height:        Wt Readings from Last 3 Encounters:  04/15/16 94 lb 2.2 oz (42.7 kg)  07/16/15 86 lb 3.2 oz (39.1 kg)  05/26/15 100 lb (45.4 kg)     Intake/Output Summary (Last 24 hours) at 04/16/16  1446 Last data filed at 04/16/16 1405  Gross per 24 hour  Intake          1844.55 ml  Output              160 ml  Net          1684.55 ml    Physical Exam:   GENERAL: Pleasant-appearing in no apparent distress.  HEAD, EYES, EARS, NOSE AND THROAT: Atraumatic, normocephalic. Extraocular muscles are intact. Pupils equal and reactive to light. Sclerae anicteric. No conjunctival injection. No oro-pharyngeal erythema.  NECK: Supple. There is no jugular venous distention.  No bruits, no lymphadenopathy, no thyromegaly.  HEART: Regular rate and rhythm,. No murmurs, no rubs, no clicks.  LUNGS: Clear to auscultation bilaterally. No rales or rhonchi. No wheezes.  ABDOMEN: Soft, flat, nontender, nondistended. Has good bowel sounds. No hepatosplenomegaly appreciated.  EXTREMITIES: No evidence of any cyanosis, clubbing, or peripheral edema.  +2 pedal and radial pulses bilaterally.  NEUROLOGIC: The patient is alert, awake, and oriented x3 with no focal motor or sensory deficits appreciated bilaterally.  SKIN: Moist and warm  has skin breakdown decubitus ulcer in the sacrum as well as the coccyx Psych: Not anxious, depressed LN: No inguinal LN enlargement    Antibiotics   Anti-infectives    Start     Dose/Rate Route Frequency Ordered Stop   04/16/16 2100  vancomycin (VANCOCIN) 500 mg in sodium chloride 0.9 % 100 mL IVPB     500 mg 100 mL/hr over 60 Minutes Intravenous Every 12 hours 04/16/16 1117     04/15/16 2100  vancomycin (VANCOCIN) IVPB 750 mg/150 ml premix  Status:  Discontinued     750 mg 150 mL/hr over 60 Minutes Intravenous Every 12 hours 04/15/16 1802 04/16/16 1117   04/15/16 2000  piperacillin-tazobactam (ZOSYN) IVPB 3.375 g     3.375 g 12.5 mL/hr over 240 Minutes Intravenous Every 8 hours 04/15/16 1802     04/15/16 1130  piperacillin-tazobactam (ZOSYN) IVPB 3.375 g     3.375 g 100 mL/hr over 30 Minutes Intravenous  Once 04/15/16 1123 04/15/16 1253   04/15/16 1130  vancomycin  (VANCOCIN) IVPB 1000 mg/200 mL premix     1,000 mg 200 mL/hr over 60 Minutes Intravenous  Once 04/15/16 1123 04/15/16 1616      Medications   Scheduled Meds: . enoxaparin (LOVENOX) injection  30 mg Subcutaneous Q24H  . oxybutynin  15 mg Oral BID  . piperacillin-tazobactam (ZOSYN)  IV  3.375 g Intravenous Q8H  . sodium hypochlorite   Irrigation BID  . vancomycin  500 mg Intravenous Q12H   Continuous Infusions: . 0.9 % NaCl with KCl 20 mEq / L Stopped (04/16/16 1019)   PRN Meds:.acetaminophen **OR** acetaminophen, albuterol, bisacodyl, diphenhydrAMINE, HYDROcodone-acetaminophen, ondansetron **OR** ondansetron (ZOFRAN) IV   Data Review:   Micro Results Recent Results (from the past 240 hour(s))  Aerobic/Anaerobic Culture (surgical/deep wound)     Status: None (Preliminary result)   Collection Time: 04/15/16 11:37 AM  Result Value Ref Range Status   Specimen Description SACRAL  Final   Special Requests Normal  Final   Gram Stain   Final    RARE WBC PRESENT, PREDOMINANTLY PMN FEW GRAM POSITIVE COCCI IN PAIRS AND CHAINS RARE GRAM POSITIVE RODS RARE GRAM NEGATIVE RODS    Culture   Final    FEW STAPHYLOCOCCUS AUREUS CULTURE REINCUBATED FOR BETTER GROWTH Performed at [facility]    Report Status PENDING  Incomplete  Blood Culture (routine x 2)     Status: None (Preliminary result)   Collection Time: 04/15/16 11:37 AM  Result Value Ref Range Status   Specimen Description BLOOD RIGHT ROREARM  Final   Special Requests   Final    BOTTLES DRAWN AEROBIC AND ANAEROBIC AER ANA   Culture NO GROWTH < 24 HOURS  Final   Report Status PENDING  Incomplete  Blood Culture (routine x 2)     Status: None (Preliminary result)   Collection Time: 04/15/16 11:50 AM  Result Value Ref Range Status   Specimen Description BLOOD LEFT AC  Final  Special Requests   Final    BOTTLES DRAWN AEROBIC AND ANAEROBIC AER 16ML ANA 19ML   Culture NO GROWTH < 24 HOURS  Final   Report  Status PENDING  Incomplete  MRSA PCR Screening     Status: None   Collection Time: 04/15/16  6:28 PM  Result Value Ref Range Status   MRSA by PCR NEGATIVE NEGATIVE Final    Comment:        The GeneXpert MRSA Assay (FDA approved for NASAL specimens only), is one component of a comprehensive MRSA colonization surveillance program. It is not intended to diagnose MRSA infection nor to guide or monitor treatment for MRSA infections.     Radiology Reports Dg Chest Port 1 View  Result Date: 04/15/2016 CLINICAL DATA:  Sepsis. EXAM: PORTABLE CHEST 1 VIEW COMPARISON:  Radiographs of December 24, 2013. FINDINGS: The heart size and mediastinal contours are within normal limits. Both lungs are clear. No pneumothorax or pleural effusion is noted. Status post surgical fixation of lower thoracic spine. IMPRESSION: No acute cardiopulmonary abnormality seen. Electronically Signed   By: Lupita RaiderJames  Green Jr, M.D.   On: 04/15/2016 11:47     CBC  Recent Labs Lab 04/15/16 1025 04/16/16 0503  WBC 13.5* 7.2  HGB 8.9* 7.5*  HCT 28.8* 24.4*  PLT 688* 461*  MCV 60.8* 62.9*  MCH 18.8* 19.3*  MCHC 30.9* 30.7*  RDW 19.9* 19.9*  LYMPHSABS 2.1  --   MONOABS 0.9  --   EOSABS 0.1  --   BASOSABS 0.1  --     Chemistries   Recent Labs Lab 04/15/16 1025 04/15/16 1812 04/16/16 0503  NA 138  --  139  K 3.4*  --  3.9  CL 103  --  111  CO2 27  --  25  GLUCOSE 110*  --  93  BUN 12  --  6  CREATININE 0.66  --  0.41*  CALCIUM 8.4*  --  7.8*  MG  --  2.0  --   AST 19  --   --   ALT 12*  --   --   ALKPHOS 80  --   --   BILITOT <0.1*  --   --    ------------------------------------------------------------------------------------------------------------------ estimated creatinine clearance is 74.1 mL/min (by C-G formula based on SCr of 0.41 mg/dL (L)). ------------------------------------------------------------------------------------------------------------------ No results for input(s): HGBA1C in the  last 72 hours. ------------------------------------------------------------------------------------------------------------------ No results for input(s): CHOL, HDL, LDLCALC, TRIG, CHOLHDL, LDLDIRECT in the last 72 hours. ------------------------------------------------------------------------------------------------------------------ No results for input(s): TSH, T4TOTAL, T3FREE, THYROIDAB in the last 72 hours.  Invalid input(s): FREET3 ------------------------------------------------------------------------------------------------------------------  Recent Labs  04/16/16 0503  FERRITIN 55  TIBC 158*  IRON 9*    Coagulation profile  Recent Labs Lab 04/15/16 1025 04/15/16 1812  INR 1.11 1.17    No results for input(s): DDIMER in the last 72 hours.  Cardiac Enzymes  Recent Labs Lab 04/15/16 1025  TROPONINI <0.03   ------------------------------------------------------------------------------------------------------------------ Invalid input(s): POCBNP    Assessment & Plan  Patient is a 40 year old with decubitus ulcer and recurrent UTIs  1. Sepsis suspect due to urinary tract infection and chronic decubitus Continue IV antibiotics await urine cultures  2. Anemia due to chronic blood loss from the wound will check a flow rate can give IV iron  3. Decubitus ulcer as per surgery there is no surgical intervention needed at this point recommend outpatient follow-up at a tertiary care center  4. Recurrent UTI. Continue antibiotics as above, follow-up cultures.  5. Thrombocytosis. Due to reaction. Improved with treatment of the infection   6. Hypokalemia. Replaced       Code Status Orders        Start     Ordered   04/15/16 1803  Full code  Continuous     04/15/16 1802    Code Status History    Date Active Date Inactive Code Status Order ID Comments User Context   04/29/2015 10:17 AM 04/30/2015  1:39 PM Full Code [id number]  Linus Galas, MD Inpatient    04/28/2015  4:38 AM 04/29/2015 10:17 AM Full Code [id number]  Milagros Loll, MD ED           Consults  Surgery   DVT Prophylaxis  Lovenox  Lab Results  Component Value Date   PLT 461 (H) 04/16/2016     Time Spent in minutes    Greater than 50% of time spent in care coordination and counseling patient regarding the condition and plan of care.   Auburn Bilberry M.D on 04/16/2016 at 2:46 PM  Between 7am to 6pm - Pager - [phone]  After 6pm go to [email or web address] - password [email or web address] [facility]  [facility] [place] [facility]   Office  [phone]

## 2016-04-17 LAB — URINE CULTURE: SPECIAL REQUESTS: NORMAL

## 2016-04-17 LAB — PREPARE RBC (CROSSMATCH)

## 2016-04-17 LAB — BASIC METABOLIC PANEL
Anion gap: 6 (ref 5–15)
CHLORIDE: 109 mmol/L (ref 101–111)
CO2: 25 mmol/L (ref 22–32)
Calcium: 8.1 mg/dL — ABNORMAL LOW (ref 8.9–10.3)
Creatinine, Ser: 0.39 mg/dL — ABNORMAL LOW (ref 0.61–1.24)
GFR calc Af Amer: >60 mL/min (ref >60)
GFR calc non Af Amer: >60 mL/min (ref >60)
GLUCOSE: 88 mg/dL (ref 65–99)
POTASSIUM: 3.8 mmol/L (ref 3.5–5.1)
Sodium: 140 mmol/L (ref 135–145)

## 2016-04-17 LAB — CBC
HCT: 23.2 % — ABNORMAL LOW (ref 40.0–52.0)
HEMOGLOBIN: 7.1 g/dL — AB (ref 13.0–18.0)
MCH: 18.9 pg — AB (ref 26.0–34.0)
MCHC: 30.6 g/dL — ABNORMAL LOW (ref 32.0–36.0)
MCV: 61.6 fL — AB (ref 80.0–100.0)
Platelets: 496 10*3/uL — ABNORMAL HIGH (ref 150–440)
RBC: 3.77 MIL/uL — AB (ref 4.40–5.90)
RDW: 19.5 % — ABNORMAL HIGH (ref 11.5–14.5)
WBC: 7.2 10*3/uL (ref 3.8–10.6)

## 2016-04-17 LAB — ABO/RH: ABO/RH(D): O POS

## 2016-04-17 LAB — VANCOMYCIN, TROUGH: Vancomycin Tr: 6 ug/mL — ABNORMAL LOW (ref 15–20)

## 2016-04-17 MED ORDER — BUPROPION HCL ER (XL) 150 MG PO TB24
300.0000 mg | ORAL_TABLET | Freq: Every day | ORAL | Status: DC
  Administered 2016-04-18: 300 mg via ORAL
  Filled 2016-04-17: qty 2

## 2016-04-17 MED ORDER — MEGESTROL ACETATE 400 MG/10ML PO SUSP: 800.0000 mg | Freq: Every day | ORAL | 0 refills | Status: DC

## 2016-04-17 MED ORDER — MEGESTROL ACETATE 400 MG/10ML PO SUSP
800.0000 mg | Freq: Every day | ORAL | Status: DC
  Administered 2016-04-17 – 2016-04-18 (×2): 800 mg via ORAL
  Filled 2016-04-17 (×2): qty 20

## 2016-04-17 MED ORDER — VANCOMYCIN HCL IN DEXTROSE 750-5 MG/150ML-% IV SOLN: 750.0000 mg | Freq: Two times a day (BID) | INTRAVENOUS | Status: DC

## 2016-04-17 MED ORDER — PIPERACILLIN-TAZOBACTAM 3.375 G IVPB: 3.3750 g | Freq: Three times a day (TID) | INTRAVENOUS | Status: DC

## 2016-04-17 MED ORDER — BUPROPION HCL ER (XL) 150 MG PO TB24: 150.0000 mg | ORAL_TABLET | Freq: Every day | ORAL | Status: DC

## 2016-04-17 MED ORDER — SODIUM CHLORIDE 0.9 % IV SOLN
Freq: Once | INTRAVENOUS | Status: AC
  Administered 2016-04-17: 14:00:00 via INTRAVENOUS

## 2016-04-17 MED ORDER — ENOXAPARIN SODIUM 30 MG/0.3ML ~~LOC~~ SOLN: 30.0000 mg | SUBCUTANEOUS | Status: DC

## 2016-04-17 MED ORDER — VANCOMYCIN HCL IN DEXTROSE 750-5 MG/150ML-% IV SOLN
750.0000 mg | Freq: Two times a day (BID) | INTRAVENOUS | Status: DC
  Administered 2016-04-17 – 2016-04-18 (×4): 750 mg via INTRAVENOUS
  Filled 2016-04-17 (×5): qty 150

## 2016-04-17 NOTE — Clinical Social Work Note (Signed)
Patient informed nursing that [facility] and stated he has thought about it and he got MRSA twice and wound did not improve and they did not check on him regularly. Patient stated that he wishes to return home with home health and have an outpatient [facility] appointment as follow up. CSW spoke at length with patient and between the choices of LTAC, nursing home for wound care, or home with outpatient wound care follow up and home health, patient has chosen home. Patient's nurse to call MD to cancel discharge and arrange discharge for tomorrow after [facility]. York SpanielMonica Anmarie York MSW,LCSW [phone]

## 2016-04-17 NOTE — Discharge Summary (Signed)
[facility]  Jonathon York, 40 y.o., DOB [birth year], MRN [id number]. Admission date: 04/15/2016 Discharge Date 04/17/2016 Primary MD [facility] Admitting Physician Shaune PollackQing Chen, MD  Admission Diagnosis  Paraplegia [facility](HCC) [G82.20] Wound infection [T14.8XXA, L08.9] Sepsis, due to unspecified organism (HCC) [A41.9] Urinary tract infection without hematuria, site unspecified [N39.0] Decubitus ulcer of sacral region, unspecified ulcer stage [L89.159]  Discharge Diagnosis   Principal Problem: Fevers chills Urinary tract infection Decubitus ulcer Severe recurrent major depression without psychotic features (HCC) Paraplegia of the both lower limbs       Hospital Course  Marshia LyRichard Pohl  is a 40 y.o. male with a known history of Paraplegia of both legs presents with fever, chills, rapid heart rate, and foul-smelling drainage from the sacral wounds as well as foul-smelling and copious urine for a few weeks, worsening for a couple days. Patient also was having bleeding from the sacral wound. He was admitted for further evaluation and therapy. He was treated for both urinary tract infection and a sacral wound. He was started on broad-spectrum antibiotics. Patient's urine culture showed Escherichia coli. He was seen by surgery who did not feel that he was a surgical candidate for any intervention at this time. They've recommended outpatient follow-up at the tertiary care at some point for a flap or other intervention. Patient also was depressed during hospitalization and was seen by psychiatry. He was started on medication for depression. Patient also complained of lack of appetite was started on Megace. He has been accepted for LTAC we will transfer him there today.            Consults  general surgery  Significant Tests:  See full reports for all details     Dg Chest Port 1 View  Result Date: 04/15/2016 CLINICAL DATA:  Sepsis. EXAM:  PORTABLE CHEST 1 VIEW COMPARISON:  Radiographs of December 24, 2013. FINDINGS: The heart size and mediastinal contours are within normal limits. Both lungs are clear. No pneumothorax or pleural effusion is noted. Status post surgical fixation of lower thoracic spine. IMPRESSION: No acute cardiopulmonary abnormality seen. Electronically Signed   By: Lupita RaiderJames  Green Jr, M.D.   On: 04/15/2016 11:47       Today   Subjective:   Marshia Lyichard Kratzke  Pt denies any complains, feels weak  Objective:   Blood pressure (!) 96/53, pulse 88, temperature 97.8 F (36.6 C), temperature source Oral, resp. rate 14, height 5\' 9"  (1.753 m), weight 94 lb 2.2 oz (42.7 kg), SpO2 99 %.  .  Intake/Output Summary (Last 24 hours) at 04/17/16 1417 Last data filed at 04/17/16 1022  Gross per 24 hour  Intake              440 ml  Output              160 ml  Net              280 ml    Exam VITAL SIGNS: Blood pressure (!) 96/53, pulse 88, temperature 97.8 F (36.6 C), temperature source Oral, resp. rate 14, height 5\' 9"  (1.753 m), weight 94 lb 2.2 oz (42.7 kg), SpO2 99 %.  GENERAL:  40 y.o.-year-old patient lying in the bed with no acute distress.  EYES: Pupils equal, round, reactive to light and accommodation. No scleral icterus. Extraocular muscles intact.  HEENT: Head atraumatic, normocephalic. Oropharynx and nasopharynx clear.  NECK:  Supple, no jugular venous distention. No thyroid enlargement, no tenderness.  LUNGS: Normal breath sounds bilaterally, no wheezing, rales,rhonchi or crepitation. No use of accessory muscles of respiration.  CARDIOVASCULAR: S1, S2 normal. No murmurs, rubs, or gallops.  ABDOMEN: Soft, nontender, nondistended. Bowel sounds present. No organomegaly or mass.  EXTREMITIES: No pedal edema, cyanosis, or clubbing.  NEUROLOGIC: Cranial nerves II through XII are intact. Muscle strength 5/5 in all extremities. Sensation intact. Gait not checked.  PSYCHIATRIC: The patient is alert and oriented x 3.   SKIN: No obvious rash, lesion, or ulcer.  + sacral decubitus ulcer  Data Review     CBC w Diff: Lab Results  Component Value Date   WBC 7.2 04/17/2016   HGB 7.1 (L) 04/17/2016   HGB 8.6 (L) 12/25/2013   HCT 23.2 (L) 04/17/2016   HCT 28.3 (L) 12/25/2013   PLT 496 (H) 04/17/2016   PLT 488 (H) 12/30/2013   LYMPHOPCT 16 04/15/2016   LYMPHOPCT 23.7 12/25/2013   MONOPCT 7 04/15/2016   MONOPCT 8.9 12/25/2013   EOSPCT 1 04/15/2016   EOSPCT 6.4 12/25/2013   BASOPCT 1 04/15/2016   BASOPCT 1.2 12/25/2013   CMP: Lab Results  Component Value Date   NA 140 04/17/2016   NA 142 12/25/2013   K 3.8 04/17/2016   K 4.0 12/25/2013   CL 109 04/17/2016   CL 110 (H) 12/25/2013   CO2 25 04/17/2016   CO2 26 12/25/2013   BUN <5 (L) 04/17/2016   BUN 7 12/25/2013   CREATININE 0.39 (L) 04/17/2016   CREATININE 0.74 12/30/2013   PROT 7.7 04/15/2016   PROT 6.7 06/10/2012   ALBUMIN 2.5 (L) 04/15/2016   ALBUMIN 2.8 (L) 06/10/2012   BILITOT <0.1 (L) 04/15/2016   BILITOT 0.2 06/10/2012   ALKPHOS 80 04/15/2016   ALKPHOS 95 06/10/2012   AST 19 04/15/2016   AST 13 (L) 06/10/2012   ALT 12 (L) 04/15/2016   ALT 15 06/10/2012  .  Micro Results Recent Results (from the past 240 hour(s))  Aerobic/Anaerobic Culture (surgical/deep wound)     Status: None (Preliminary result)   Collection Time: 04/15/16 11:37 AM  Result Value Ref Range Status   Specimen Description SACRAL  Final   Special Requests Normal  Final   Gram Stain   Final    RARE WBC PRESENT, PREDOMINANTLY PMN FEW GRAM POSITIVE COCCI IN PAIRS AND CHAINS RARE GRAM POSITIVE RODS RARE GRAM NEGATIVE RODS    Culture   Final    FEW STAPHYLOCOCCUS AUREUS SUSCEPTIBILITIES TO FOLLOW HOLDING FOR POSSIBLE ANAEROBE Performed at [facility]    Report Status PENDING  Incomplete  Blood Culture (routine x 2)     Status: None (Preliminary result)   Collection Time: 04/15/16 11:37 AM  Result Value Ref Range Status   Specimen Description  BLOOD RIGHT ROREARM  Final   Special Requests   Final    BOTTLES DRAWN AEROBIC AND ANAEROBIC AER ANA   Culture NO GROWTH 2 DAYS  Final   Report Status PENDING  Incomplete  Blood Culture (routine x 2)     Status: None (Preliminary result)   Collection Time: 04/15/16 11:50 AM  Result Value Ref Range Status   Specimen Description BLOOD LEFT AC  Final   Special Requests   Final    BOTTLES DRAWN AEROBIC AND ANAEROBIC AER ANA   Culture NO GROWTH 2 DAYS  Final   Report Status PENDING  Incomplete  Urine culture     Status: Abnormal   Collection Time: 04/15/16 12:04 PM  Result Value Ref Range Status   Specimen Description URINE, RANDOM  Final   Special Requests Normal  Final   Culture >=100,000 COLONIES/mL ESCHERICHIA COLI (A)  Final   Report Status 04/17/2016 FINAL  Final   Organism ID, Bacteria ESCHERICHIA COLI (A)  Final      Susceptibility   Escherichia coli - MIC*    AMPICILLIN 4 SENSITIVE Sensitive     CEFAZOLIN <=4 SENSITIVE Sensitive     CEFTRIAXONE <=1 SENSITIVE Sensitive     CIPROFLOXACIN <=0.25 SENSITIVE Sensitive     GENTAMICIN <=1 SENSITIVE Sensitive     IMIPENEM <=0.25 SENSITIVE Sensitive     NITROFURANTOIN <=16 SENSITIVE Sensitive     TRIMETH/SULFA <=20 SENSITIVE Sensitive     AMPICILLIN/SULBACTAM <=2 SENSITIVE Sensitive     PIP/TAZO <=4 SENSITIVE Sensitive     Extended ESBL NEGATIVE Sensitive     * >=100,000 COLONIES/mL ESCHERICHIA COLI  MRSA PCR Screening     Status: None   Collection Time: 04/15/16  6:28 PM  Result Value Ref Range Status   MRSA by PCR NEGATIVE NEGATIVE Final    Comment:        The GeneXpert MRSA Assay (FDA approved for NASAL specimens only), is one component of a comprehensive MRSA colonization surveillance program. It is not intended to diagnose MRSA infection nor to guide or monitor treatment for MRSA infections.         Code Status Orders        Start     Ordered   04/15/16 1803  Full code  Continuous      04/15/16 1802    Code Status History    Date Active Date Inactive Code Status Order ID Comments User Context   04/29/2015 10:17 AM 04/30/2015  1:39 PM Full Code [id number]  Linus Galas, MD Inpatient   04/28/2015  4:38 AM 04/29/2015 10:17 AM Full Code [id number]  Milagros Loll, MD ED          Follow-up Information    md at [facility] Follow up.           Discharge Medications   Allergies as of 04/17/2016   No Known Allergies     Medication List    STOP taking these medications   oxyCODONE-acetaminophen 5-325 MG tablet Commonly known as:  ROXICET     TAKE these medications   bisacodyl 5 MG EC tablet Commonly known as:  DULCOLAX Take 1 tablet (5 mg total) by mouth daily as needed for moderate constipation.   buPROPion 150 MG 24 hr tablet Commonly known as:  WELLBUTRIN XL Take 1 tablet (150 mg total) by mouth daily. Start taking on:  04/18/2016   diphenhydrAMINE 25 mg capsule Commonly known as:  BENADRYL Take 1 capsule (25 mg total) by mouth every 4 (four) hours as needed for itching or allergies.   docusate sodium 100 MG capsule Commonly known as:  COLACE Take 1 capsule (100 mg total) by mouth 2 (two) times daily.   enoxaparin 30 MG/0.3ML injection Commonly known as:  LOVENOX Inject 0.3 mLs (30 mg total) into the skin daily.   ketorolac 10 MG tablet Commonly known as:  TORADOL Take 1 tablet (10 mg total) by mouth every 8 (eight) hours as needed.   megestrol 400 MG/10ML suspension Commonly known as:  MEGACE Take 20 mLs (800 mg total) by mouth daily. Start taking on:  04/18/2016   oxybutynin 15 MG 24 hr tablet Commonly known as:  DITROPAN XL Take 15 mg by mouth  2 (two) times daily.   oxyCODONE 5 MG immediate release tablet Commonly known as:  Oxy IR/ROXICODONE Take 1 tablet (5 mg total) by mouth every 4 (four) hours as needed for moderate pain or severe pain.   piperacillin-tazobactam 3.375 GM/50ML IVPB Commonly known as:  ZOSYN Inject 50 mLs (3.375 g total)  into the vein every 8 (eight) hours.   promethazine 25 MG tablet Commonly known as:  PHENERGAN Take 1 tablet (25 mg total) by mouth every 6 (six) hours as needed for nausea or vomiting.   Vancomycin 750-5 MG/150ML-% Soln Commonly known as:  VANCOCIN Inject 150 mLs (750 mg total) into the vein every 12 (twelve) hours.          Total Time in preparing paper work, data evaluation and todays exam - 35 minutes  Auburn BilberryPATEL, Ahyan Kreeger M.D on 04/17/2016 at 2:17 PM  [facility]   Office  [phone]

## 2016-04-17 NOTE — Consult Note (Signed)
[facility] Consult   Reason for Consult:  Consult for 40 year old man with history of paraplegia and depression. Referring Physician:  Posey Pronto [name] MRN:  [id number] Principal Diagnosis: Severe recurrent major depression without psychotic features [facility]) Diagnosis:   Patient Active Problem List   Diagnosis Date Noted  . Severe recurrent major depression without psychotic features ([place]) [F33.2] 04/16/2016  . Decubitus ulcer of sacral region [L89.159]   . Sepsis ([place]) [A41.9] 04/15/2016  . Amputation of fourth toe, right, traumatic ([place]) [Q25.956L] 04/30/2015  . Pressure ulcer stage III [L89.93] 04/30/2015  . Sterile pyuria [N39.0] 04/30/2015  . Paraplegia ([place]) [G82.20] 04/30/2015  . Neurogenic bladder [N31.9] 04/30/2015  . 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 Malnutrition of moderate degree [E44.0] 04/28/2015    Total Time spent with patient: 20 minutes  Follow-up for Wednesday the 20th. Patient still depressed but affect euthymic. He is cooperative with treatment. No sign of psychosis. Still fatigued and generally negative. No active threats of self-harm. Patient tolerated initiating Wellbutrin.  Subjective:   Jonathon York is a 40 y.o. male patient admitted with "I've been real sick".  HPI:  Patient interviewed. Chart reviewed. This is a 40 year old man with paraplegia in the hospital with worsening of his decubitus ulcers and sepsis. Concern raised about depression. Patient tells me that he stays depressed most of the time and has been for probably years. It comes and goes but recently has been worse. He feels tired and run down and hopeless and sleeps all the time. His appetite is poor. He says he has intermittent suicidal thoughts. Does not have any current plan or intent to act on it. He has a ongoing use of marijuana. Not on any medicine however currently for depression and hasn't been for years. Major stresses are  living alone social isolation severe medical problems  Medical history: Patient is paraplegic. Has had a decubitus ulcer that has not healed for a long time. Came into the hospital with sepsis as well.  Social history: Lives by himself. He has sort of run off all of his family. He has daughters and other family but doesn't get along very well with them. Stays socially isolated.  Substance abuse history: Admits that he uses marijuana regularly. Doesn't use any other drugs currently doesn't drink.  Past Psychiatric History: Patient has a history of recurrent depression. History of suicidal ideation. Says that he's tried to kill himself "many times" in the past. Doesn't give much detail about it. Last time I saw the patient was about 4 years ago when he was depressed then. He hasn't been on antidepressants in the long time. Doesn't remember whether they ever really worked.  Risk to Self: Is patient at risk for suicide?: No Risk to Others:   Prior Inpatient Therapy:   Prior Outpatient Therapy:    Past Medical History:  Past Medical History:  Diagnosis Date  . Paraparesis of both lower limbs ([place]) 02/21/00    Past Surgical History:  Procedure Laterality Date  . AMPUTATION TOE Right 04/29/2015   Procedure: AMPUTATION TOE;  Surgeon: Sharlotte Alamo, MD;  Location: [facility] ORS;  Service: Podiatry;  Laterality: Right;  . IRRIGATION AND DEBRIDEMENT BUTTOCKS    . TOE AMPUTATION Right    Family History:  Family History  Problem Relation Age of Onset  . Lymphoma Sister    Family Psychiatric  History: None Social History:  History  Alcohol Use No     History  Drug Use  .  Types: Marijuana    Social History   Social History  . Marital status: Single    Spouse name: N/A  . Number of children: N/A  . Years of education: N/A   Social History Main Topics  . Smoking status: Former Smoker    Packs/day: 0.00    Types: Cigarettes  . Smokeless tobacco: Never Used  . Alcohol use No  . Drug use:      Types: Marijuana  . Sexual activity: Not Asked   Other Topics Concern  . None   Social History Narrative  . None   Additional Social History:    Allergies:  No Known Allergies  Labs:  Results for orders placed or performed during the hospital encounter of 04/15/16 (from the past 48 hour(s))  Magnesium     Status: None   Collection Time: 04/15/16  6:12 PM  Result Value Ref Range   Magnesium 2.0 1.7 - 2.4 mg/dL  Procalcitonin     Status: None   Collection Time: 04/15/16  6:12 PM  Result Value Ref Range   Procalcitonin <0.10 ng/mL    Comment:        Interpretation: PCT (Procalcitonin) <= 0.5 ng/mL: Systemic infection (sepsis) is not likely. Local bacterial infection is possible. (NOTE)         ICU PCT Algorithm               Non ICU PCT Algorithm    ----------------------------     ------------------------------         PCT < 0.25 ng/mL                 PCT < 0.1 ng/mL     Stopping of antibiotics            Stopping of antibiotics       strongly encouraged.               strongly encouraged.    ----------------------------     ------------------------------       PCT level decrease by               PCT < 0.25 ng/mL       >= 80% from peak PCT       OR PCT 0.25 - 0.5 ng/mL          Stopping of antibiotics                                             encouraged.     Stopping of antibiotics           encouraged.    ----------------------------     ------------------------------       PCT level decrease by              PCT >= 0.25 ng/mL       < 80% from peak PCT        AND PCT >= 0.5 ng/mL            Continuin g antibiotics                                              encouraged.       Continuing antibiotics  encouraged.    ----------------------------     ------------------------------     PCT level increase compared          PCT > 0.5 ng/mL         with peak PCT AND          PCT >= 0.5 ng/mL             Escalation of antibiotics                                           strongly encouraged.      Escalation of antibiotics        strongly encouraged.   Protime-INR     Status: None   Collection Time: 04/15/16  6:12 PM  Result Value Ref Range   Prothrombin Time 15.0 11.4 - 15.2 seconds   INR 1.17   APTT     Status: None   Collection Time: 04/15/16  6:12 PM  Result Value Ref Range   aPTT 35 24 - 36 seconds  MRSA PCR Screening     Status: None   Collection Time: 04/15/16  6:28 PM  Result Value Ref Range   MRSA by PCR NEGATIVE NEGATIVE    Comment:        The GeneXpert MRSA Assay (FDA approved for NASAL specimens only), is one component of a comprehensive MRSA colonization surveillance program. It is not intended to diagnose MRSA infection nor to guide or monitor treatment for MRSA infections.   Basic metabolic panel     Status: Abnormal   Collection Time: 04/16/16  5:03 AM  Result Value Ref Range   Sodium 139 135 - 145 mmol/L   Potassium 3.9 3.5 - 5.1 mmol/L   Chloride 111 101 - 111 mmol/L   CO2 25 22 - 32 mmol/L   Glucose, Bld 93 65 - 99 mg/dL   BUN 6 6 - 20 mg/dL   Creatinine, Ser 0.41 (L) 0.61 - 1.24 mg/dL   Calcium 7.8 (L) 8.9 - 10.3 mg/dL   GFR calc non Af Amer >60 >60 mL/min   GFR calc Af Amer >60 >60 mL/min    Comment: (NOTE) The eGFR has been calculated using the CKD EPI equation. This calculation has not been validated in all clinical situations. eGFR's persistently <60 mL/min signify possible Chronic Kidney Disease.    Anion gap 3 (L) 5 - 15  CBC     Status: Abnormal   Collection Time: 04/16/16  5:03 AM  Result Value Ref Range   WBC 7.2 3.8 - 10.6 K/uL   RBC 3.88 (L) 4.40 - 5.90 MIL/uL   Hemoglobin 7.5 (L) 13.0 - 18.0 g/dL   HCT 24.4 (L) 40.0 - 52.0 %   MCV 62.9 (L) 80.0 - 100.0 fL   MCH 19.3 (L) 26.0 - 34.0 pg   MCHC 30.7 (L) 32.0 - 36.0 g/dL   RDW 19.9 (H) 11.5 - 14.5 %   Platelets 461 (H) 150 - 440 K/uL  Ferritin     Status: None   Collection Time: 04/16/16  5:03 AM  Result Value Ref Range   Ferritin  55 24 - 336 ng/mL  Iron and TIBC     Status: Abnormal   Collection Time: 04/16/16  5:03 AM  Result Value Ref Range   Iron 9 (L) 45 - 182 ug/dL   TIBC 158 (L) 250 -  450 ug/dL   Saturation Ratios 6 (L) 17.9 - 39.5 %   UIBC 149 ug/dL  CBC     Status: Abnormal   Collection Time: 04/17/16  8:15 AM  Result Value Ref Range   WBC 7.2 3.8 - 10.6 K/uL   RBC 3.77 (L) 4.40 - 5.90 MIL/uL   Hemoglobin 7.1 (L) 13.0 - 18.0 g/dL   HCT 23.2 (L) 40.0 - 52.0 %   MCV 61.6 (L) 80.0 - 100.0 fL   MCH 18.9 (L) 26.0 - 34.0 pg   MCHC 30.6 (L) 32.0 - 36.0 g/dL   RDW 19.5 (H) 11.5 - 14.5 %   Platelets 496 (H) 150 - 440 K/uL  Basic metabolic panel     Status: Abnormal   Collection Time: 04/17/16  8:15 AM  Result Value Ref Range   Sodium 140 135 - 145 mmol/L   Potassium 3.8 3.5 - 5.1 mmol/L   Chloride 109 101 - 111 mmol/L   CO2 25 22 - 32 mmol/L   Glucose, Bld 88 65 - 99 mg/dL   BUN <5 (L) 6 - 20 mg/dL   Creatinine, Ser 0.39 (L) 0.61 - 1.24 mg/dL   Calcium 8.1 (L) 8.9 - 10.3 mg/dL   GFR calc non Af Amer >60 >60 mL/min   GFR calc Af Amer >60 >60 mL/min    Comment: (NOTE) The eGFR has been calculated using the CKD EPI equation. This calculation has not been validated in all clinical situations. eGFR's persistently <60 mL/min signify possible Chronic Kidney Disease.    Anion gap 6 5 - 15  Vancomycin, trough     Status: Abnormal   Collection Time: 04/17/16  8:15 AM  Result Value Ref Range   Vancomycin Tr 6 (L) 15 - 20 ug/mL  ABO/Rh     Status: None   Collection Time: 04/17/16  8:15 AM  Result Value Ref Range   ABO/RH(D) O POS   Type and screen [place]     Status: None (Preliminary result)   Collection Time: 04/17/16  9:52 AM  Result Value Ref Range   ISSUE DATE / TIME [id number]    Blood Product Unit Number [id number]    PRODUCT CODE [id number]    Unit Type and Rh 9500    Blood Product Expiration Date [id number]   Prepare RBC     Status: None   Collection Time:  04/17/16 10:09 AM  Result Value Ref Range   Order Confirmation ORDER PROCESSED BY BLOOD BANK     Current Facility-Administered Medications  Medication Dose Route Frequency Provider Last Rate Last Dose  . acetaminophen (TYLENOL) tablet 650 mg  650 mg Oral Q6H PRN Demetrios Loll, MD       Or  . acetaminophen (TYLENOL) suppository 650 mg  650 mg Rectal Q6H PRN Demetrios Loll, MD      . albuterol (PROVENTIL) (2.5 MG/3ML) 0.083% nebulizer solution 2.5 mg  2.5 mg Nebulization Q2H PRN Demetrios Loll, MD      . bisacodyl (DULCOLAX) EC tablet 5 mg  5 mg Oral Daily PRN Demetrios Loll, MD      . Derrill Memo ON 04/18/2016] buPROPion (WELLBUTRIN XL) 24 hr tablet 300 mg  300 mg Oral Daily Gonzella Lex, MD      . diphenhydrAMINE (BENADRYL) capsule 25 mg  25 mg Oral Q6H PRN Gladstone Lighter, MD   25 mg at 04/15/16 2055  . enoxaparin (LOVENOX) injection 30 mg  30 mg Subcutaneous Q24H Dustin Flock, MD      .  feeding supplement (ENSURE ENLIVE) (ENSURE ENLIVE) liquid 237 mL  237 mL Oral TID BM Dustin Flock, MD   237 mL at 04/17/16 1400  . HYDROcodone-acetaminophen (NORCO/VICODIN) 5-325 MG per tablet 1-2 tablet  1-2 tablet Oral Q4H PRN Demetrios Loll, MD   1 tablet at 04/17/16 1726  . megestrol (MEGACE) 400 MG/10ML suspension 800 mg  800 mg Oral Daily Dustin Flock, MD   800 mg at 04/17/16 1035  . multivitamin with minerals tablet 1 tablet  1 tablet Oral Daily Dustin Flock, MD   1 tablet at 04/17/16 1001  . ondansetron (ZOFRAN) tablet 4 mg  4 mg Oral Q6H PRN Demetrios Loll, MD       Or  . ondansetron [facility]) injection 4 mg  4 mg Intravenous Q6H PRN Demetrios Loll, MD   4 mg at 04/17/16 0526  . oxybutynin (DITROPAN-XL) 24 hr tablet 15 mg  15 mg Oral BID Gladstone Lighter, MD   15 mg at 04/17/16 1001  . piperacillin-tazobactam (ZOSYN) IVPB 3.375 g  3.375 g Intravenous Q8H Melissa D Maccia, RPH   3.375 g at 04/17/16 1200  . sodium hypochlorite (DAKIN'S 1/4 STRENGTH) topical solution   Irrigation BID Dustin Flock, MD      . vancomycin  (VANCOCIN) IVPB 750 mg/150 ml premix  750 mg Intravenous Q12H Dustin Flock, MD   750 mg at 04/17/16 1006    Musculoskeletal: Strength & Muscle Tone: decreased Gait & Station: unable to stand Patient leans: N/A  Psychiatric Specialty Exam: Physical Exam  Nursing note and vitals reviewed. Constitutional: He appears well-developed.  HENT:  Head: Normocephalic and atraumatic.  Eyes: Conjunctivae are normal. Pupils are equal, round, and reactive to light.  Neck: Normal range of motion.  Cardiovascular: Normal heart sounds.   Respiratory: Effort normal.  GI: Soft.  Musculoskeletal:       Legs: Neurological: He is alert.  Skin: Skin is warm and dry.     Psychiatric: Judgment normal. His speech is delayed. He is slowed. Cognition and memory are normal. He exhibits a depressed mood. He expresses suicidal ideation. He expresses no suicidal plans.    Review of Systems  Constitutional: Positive for fever, malaise/fatigue and weight loss.  HENT: Negative.   Eyes: Negative.   Respiratory: Negative.   Cardiovascular: Negative.   Gastrointestinal: Negative.   Musculoskeletal: Positive for back pain.  Skin: Negative.   Neurological: Negative.   Psychiatric/Behavioral: Positive for depression, substance abuse and suicidal ideas. Negative for hallucinations and memory loss. The patient is not nervous/anxious and does not have insomnia.     Blood pressure 100/63, pulse 84, temperature 98.2 F (36.8 C), temperature source Oral, resp. rate 16, height '5\' 9"'$  (1.753 m), weight 42.7 kg (94 lb 2.2 oz), SpO2 94 %.Body mass index is 13.9 kg/m.  General Appearance: Casual  Eye Contact:  Fair  Speech:  Normal Rate  Volume:  Decreased  Mood:  Depressed  Affect:  Depressed  Thought Process:  Goal Directed  Orientation:  Full (Time, Place, and Person)  Thought Content:  Logical  Suicidal Thoughts:  Yes.  without intent/plan  Homicidal Thoughts:  No  Memory:  Immediate;   Good Recent;    Fair Remote;   Fair  Judgement:  Fair  Insight:  Fair  Psychomotor Activity:  Decreased  Concentration:  Concentration: Fair  Recall:  [employer] of Knowledge:  Fair  Language:  Fair  Akathisia:  No  Handed:  Right  AIMS (if indicated):     Assets:  Communication Skills Desire for Improvement Housing Resilience  ADL's:  Intact  Cognition:  WNL  Sleep:        Treatment Plan Summary: Daily contact with patient to assess and evaluate symptoms and progress in treatment, Medication management and Plan Increase dose of Wellbutrin to 300 mg per day. Patient agrees. Follow-up as needed.  Disposition: Patient does not meet criteria for psychiatric inpatient admission. Supportive therapy provided about ongoing stressors.  Alethia Berthold, MD 04/17/2016 5:41 PM

## 2016-04-17 NOTE — Discharge Instructions (Addendum)
[facility] - [place] at [facility]  DIET:  Regular diet  DISCHARGE CONDITION:  Stable  ACTIVITY:  Activity as tolerated  OXYGEN:  Home Oxygen: No.   Oxygen Delivery: room air  DISCHARGE LOCATION:  [place]   ADDITIONAL DISCHARGE INSTRUCTION: Wound care to sacral Stage 4 and left ischial tuberosity Stage 3 pressure injuries:  Cleanse with NS, pat gently dry.  Apply 1/4 strength Dakin's solution moistened Kerlix roll gauze dressings to both wounds, top with dry gauze dressings, ABD and secure with paper tape. Patient to turn and reposition from side to side and avoid the supine and sitting upright in bed. HOB elevation at or below 30-degree angle.   Wound care to sacral Stage 4 and left ischial tuberosity Stage 3 pressure injuries.  Cleanse with NS, pat gently dry.  Cover wound bed with silver hydrofiber dressing (Aquacel Ag+, [place] # [id number]). Top with dry gauze dressings, ABD and secure with tape.  Change twice daily.  If you experience worsening of your admission symptoms, develop shortness of breath, life threatening emergency, suicidal or homicidal thoughts you must seek medical attention immediately by calling 911 or calling your MD immediately  if symptoms less severe.  You Must read complete instructions/literature along with all the possible adverse reactions/side effects for all the Medicines you take and that have been prescribed to you. Take any new Medicines after you have completely understood and accpet all the possible adverse reactions/side effects.   Please note  You were cared for by a hospitalist during your hospital stay. If you have any questions about your discharge medications or the care you received while you were in the hospital after you are discharged, you can call the unit and asked to speak with the hospitalist on call if the hospitalist that took care of you is not available. Once you are discharged, your primary care physician will handle  any further medical issues. Please note that NO REFILLS for any discharge medications will be authorized once you are discharged, as it is imperative that you return to your primary care physician (or establish a relationship with a primary care physician if you do not have one) for your aftercare needs so that they can reassess your need for medications and monitor your lab values.  Wound care. HHPT.

## 2016-04-17 NOTE — Consult Note (Signed)
Pharmacy Antibiotic Note  Jonathon York is a 40 y.o. male admitted on 04/15/2016 with sepsis.  Pharmacy has been consulted for vancomycin and Zosyn dosing. Pt has sacral wound with discharge, and burning upon urination  Plan: Current orders for vancomycin 500mg  IV Q12H, has received 1 dose of this regimen (previously on 750 Q12H x 2 dose). Trough this AM is 416mcg/ml, although not at steady state. Anticipate will not reach steady state trough of 15-1220mcg/ml with this regimen, will increase to vancomycin 750mg  IV Q12H. Recheck trough prior to 5th dose which will be at steady sate.  Continue zosyn 3.375gm IV Q8H extended infusion   Height: 5\' 9"  (175.3 cm) Weight: 94 lb 2.2 oz (42.7 kg) IBW/kg (Calculated) : 70.7  Temp (24hrs), Avg:97.9 F (36.6 C), Min:97.8 F (36.6 C), Max:98.1 F (36.7 C)   Recent Labs Lab 04/15/16 1025 04/15/16 1137 04/16/16 0503 04/17/16 0815  WBC 13.5*  --  7.2 7.2  CREATININE 0.66  --  0.41* 0.39*  LATICACIDVEN  --  1.1  --   --   VANCOTROUGH  --   --   --  6*    Estimated Creatinine Clearance: 74.1 mL/min (by C-G formula based on SCr of 0.39 mg/dL (L)).    No Known Allergies  Antimicrobials this admission: vancomycin 12/18 >>  zosyn 12/18 >>   Dose adjustments this admission:   Microbiology results: 12/18 BCx: NGTD x2 12/18 UCx: >100k e. Coli - pan sensitive 12/18 MRSA PCR: neg 12/18 WCx: few staph aureus, possible anaerobe -ID/sens pending  Thank you for allowing pharmacy to be a part of this patient's care.  Garlon HatchetJody Zavian Slowey, PharmD, BCPS Clinical Pharmacist  04/17/2016 9:15 AM

## 2016-04-18 ENCOUNTER — Ambulatory Visit (HOSPITAL_COMMUNITY)
Admission: AD | Admit: 2016-04-18 | Discharge: 2016-04-18 | Disposition: A | Discharge status: Home / Self Care | Payer: Medicare Other | Source: Other Acute Inpatient Hospital | Attending: Internal Medicine | Admitting: Internal Medicine

## 2016-04-18 DIAGNOSIS — G8220 Paraplegia, unspecified: Secondary | ICD-10-CM | POA: Insufficient documentation

## 2016-04-18 DIAGNOSIS — A419 Sepsis, unspecified organism: Secondary | ICD-10-CM | POA: Insufficient documentation

## 2016-04-18 DIAGNOSIS — F339 Major depressive disorder, recurrent, unspecified: Secondary | ICD-10-CM | POA: Insufficient documentation

## 2016-04-18 DIAGNOSIS — L89159 Pressure ulcer of sacral region, unspecified stage: Secondary | ICD-10-CM | POA: Insufficient documentation

## 2016-04-18 LAB — BASIC METABOLIC PANEL
Anion gap: 5 (ref 5–15)
BUN: 8 mg/dL (ref 6–20)
CALCIUM: 8.4 mg/dL — AB (ref 8.9–10.3)
CO2: 25 mmol/L (ref 22–32)
CREATININE: 0.45 mg/dL — AB (ref 0.61–1.24)
Chloride: 112 mmol/L — ABNORMAL HIGH (ref 101–111)
GFR calc non Af Amer: >60 mL/min (ref >60)
Glucose, Bld: 93 mg/dL (ref 65–99)
Potassium: 3.8 mmol/L (ref 3.5–5.1)
Sodium: 142 mmol/L (ref 135–145)

## 2016-04-18 LAB — TYPE AND SCREEN
ABO/RH(D): O POS
Antibody Screen: NEGATIVE
UNIT DIVISION: 0

## 2016-04-18 LAB — CBC
HEMATOCRIT: 27.3 % — AB (ref 40.0–52.0)
Hemoglobin: 8.6 g/dL — ABNORMAL LOW (ref 13.0–18.0)
MCH: 20.1 pg — AB (ref 26.0–34.0)
MCHC: 31.3 g/dL — AB (ref 32.0–36.0)
MCV: 64 fL — ABNORMAL LOW (ref 80.0–100.0)
Platelets: 498 10*3/uL — ABNORMAL HIGH (ref 150–440)
RBC: 4.27 MIL/uL — ABNORMAL LOW (ref 4.40–5.90)
RDW: 22 % — AB (ref 11.5–14.5)
WBC: 7.1 10*3/uL (ref 3.8–10.6)

## 2016-04-18 LAB — C-REACTIVE PROTEIN: CRP: 7.1 mg/dL — AB (ref <1.0)

## 2016-04-18 MED ORDER — PIPERACILLIN-TAZOBACTAM 3.375 G IVPB
3.3750 g | Freq: Three times a day (TID) | INTRAVENOUS | Status: DC
Start: 2016-04-18 — End: 2016-11-21

## 2016-04-18 MED ORDER — BUPROPION HCL ER (XL) 150 MG PO TB24: 150.0000 mg | ORAL_TABLET | Freq: Every day | ORAL | 0 refills | Status: DC

## 2016-04-18 NOTE — Progress Notes (Signed)
Pt alert and engaged in conversation. No family or friends to support Pt during this time. CH offered prayer.   04/18/16 1505  Clinical Encounter Type  Visited With Patient  Visit Type Initial  Referral From Nurse  Spiritual Encounters  Spiritual Needs Prayer;Emotional  Stress Factors  Patient Stress Factors Family relationships

## 2016-04-18 NOTE — Discharge Summary (Signed)
[employer] - [place] at [facility]   PATIENT NAME: Jonathon York    MR#:  [id number]  DATE OF BIRTH:  [birth year]  DATE OF ADMISSION:  04/15/2016   ADMITTING PHYSICIAN: Shaune Pollack, MD  DATE OF DISCHARGE: 04/18/2016 PRIMARY CARE PHYSICIAN: [facility]   ADMISSION DIAGNOSIS:  Paraplegia (HCC) [G82.20] Wound infection [T14.8XXA, L08.9] Sepsis, due to unspecified organism (HCC) [A41.9] Urinary tract infection without hematuria, site unspecified [N39.0] Decubitus ulcer of sacral region, unspecified ulcer stage [L89.159] DISCHARGE DIAGNOSIS:  Principal Problem:   Severe recurrent major depression without psychotic features (HCC) Active Problems:   Sepsis (HCC)   Decubitus ulcer of sacral region  SECONDARY DIAGNOSIS:   Past Medical History:  Diagnosis Date  . Paraparesis of both lower limbs (HCC) 02/21/00   HOSPITAL COURSE:   Jonathon York a 40 y.o.malewith a known history of Paraplegia of both legs presents withfever, chills, rapid heart rate, and foul-smelling drainage from the sacral wounds as well as foul-smelling and copious urinefor a few weeks, worsening for a couple days.  Patient also was having bleeding from the sacral wound. He was admitted for further evaluation and therapy. He was treated for both urinary tract infection and a sacral wound. He was started on broad-spectrum antibiotics. Patient's urine culture showed Escherichia coli. He was seen by surgery who did not feel that he was a surgical candidate for any intervention at this time. They've recommended outpatient follow-up at the tertiary care at some point for a flap or other intervention. Patient also was depressed during hospitalization and was seen by psychiatry. He was started on medication for depression. Patient also complained of lack of appetite was started on Megace. He has been accepted for LTAC, but he refused to go there yesterday. He wants to go home with HHPT.  Since Dr. Sampson Goon suggested IV zosyn for 6 weeks, he changed mind and wants to go to LTAC.  I discussed with Dr. Sampson Goon.  DISCHARGE CONDITIONS:  Stable, discharge to LTAC today. CONSULTS OBTAINED:  Treatment Team:  Audery Amel, MD Mick Sell, MD DRUG ALLERGIES:  No Known Allergies DISCHARGE MEDICATIONS:   Allergies as of 04/18/2016   No Known Allergies     Medication List    STOP taking these medications   ketorolac 10 MG tablet Commonly known as:  TORADOL   oxyCODONE 5 MG immediate release tablet Commonly known as:  Oxy IR/ROXICODONE   oxyCODONE-acetaminophen 5-325 MG tablet Commonly known as:  ROXICET     TAKE these medications   bisacodyl 5 MG EC tablet Commonly known as:  DULCOLAX Take 1 tablet (5 mg total) by mouth daily as needed for moderate constipation.   buPROPion 150 MG 24 hr tablet Commonly known as:  WELLBUTRIN XL Take 1 tablet (150 mg total) by mouth daily.   diphenhydrAMINE 25 mg capsule Commonly known as:  BENADRYL Take 1 capsule (25 mg total) by mouth every 4 (four) hours as needed for itching or allergies.   docusate sodium 100 MG capsule Commonly known as:  COLACE Take 1 capsule (100 mg total) by mouth 2 (two) times daily.   megestrol 400 MG/10ML suspension Commonly known as:  MEGACE Take 20 mLs (800 mg total) by mouth daily.   oxybutynin 15 MG 24 hr tablet Commonly known as:  DITROPAN XL Take 15 mg by mouth 2 (two) times daily.   piperacillin-tazobactam 3.375 GM/50ML IVPB Commonly known as:  ZOSYN Inject 50 mLs (3.375 g total) into the vein every  8 (eight) hours.   promethazine 25 MG tablet Commonly known as:  PHENERGAN Take 1 tablet (25 mg total) by mouth every 6 (six) hours as needed for nausea or vomiting.        DISCHARGE INSTRUCTIONS:  See AVS.  If you experience worsening of your admission symptoms, develop shortness of breath, life threatening emergency, suicidal or homicidal thoughts you must seek  medical attention immediately by calling 911 or calling your MD immediately  if symptoms less severe.  You Must read complete instructions/literature along with all the possible adverse reactions/side effects for all the Medicines you take and that have been prescribed to you. Take any new Medicines after you have completely understood and accpet all the possible adverse reactions/side effects.   Please note  You were cared for by a hospitalist during your hospital stay. If you have any questions about your discharge medications or the care you received while you were in the hospital after you are discharged, you can call the unit and asked to speak with the hospitalist on call if the hospitalist that took care of you is not available. Once you are discharged, your primary care physician will handle any further medical issues. Please note that NO REFILLS for any discharge medications will be authorized once you are discharged, as it is imperative that you return to your primary care physician (or establish a relationship with a primary care physician if you do not have one) for your aftercare needs so that they can reassess your need for medications and monitor your lab values.    On the day of Discharge:  VITAL SIGNS:  Blood pressure 98/62, pulse 82, temperature 98.1 F (36.7 C), temperature source Oral, resp. rate 18, height 5\' 9"  (1.753 m), weight 94 lb 2.2 oz (42.7 kg), SpO2 100 %. PHYSICAL EXAMINATION:  GENERAL:  40 y.o.-year-old patient lying in the bed with no acute distress.  EYES: Pupils equal, round, reactive to light and accommodation. No scleral icterus. Extraocular muscles intact.  HEENT: Head atraumatic, normocephalic. Oropharynx and nasopharynx clear.  NECK:  Supple, no jugular venous distention. No thyroid enlargement, no tenderness.  LUNGS: Normal breath sounds bilaterally, no wheezing, rales,rhonchi or crepitation. No use of accessory muscles of respiration.  CARDIOVASCULAR: S1,  S2 normal. No murmurs, rubs, or gallops.  ABDOMEN: Soft, non-tender, non-distended. Bowel sounds present. No organomegaly or mass.  EXTREMITIES: No pedal edema, cyanosis, or clubbing.  NEUROLOGIC: Cranial nerves II through XII are intact. Paralyzed lower extremities. PSYCHIATRIC: The patient is alert and oriented x 3.  SKIN: Sacral DUx2, stage IV in dressing. DATA REVIEW:   CBC  Recent Labs Lab 04/18/16 0452  WBC 7.1  HGB 8.6*  HCT 27.3*  PLT 498*    Chemistries   Recent Labs Lab 04/15/16 1025 04/15/16 1812  04/18/16 0452  NA 138  --   < > 142  K 3.4*  --   < > 3.8  CL 103  --   < > 112*  CO2 27  --   < > 25  GLUCOSE 110*  --   < > 93  BUN 12  --   < > 8  CREATININE 0.66  --   < > 0.45*  CALCIUM 8.4*  --   < > 8.4*  MG  --  2.0  --   --   AST 19  --   --   --   ALT 12*  --   --   --   [facility]  80  --   --   --   BILITOT <0.1*  --   --   --   < > = values in this interval not displayed.   Microbiology Results  Results for orders placed or performed during the hospital encounter of 04/15/16  Aerobic/Anaerobic Culture (surgical/deep wound)     Status: None (Preliminary result)   Collection Time: 04/15/16 11:37 AM  Result Value Ref Range Status   Specimen Description SACRAL  Final   Special Requests Normal  Final   Gram Stain   Final    RARE WBC PRESENT, PREDOMINANTLY PMN FEW GRAM POSITIVE COCCI IN PAIRS AND CHAINS RARE GRAM POSITIVE RODS RARE GRAM NEGATIVE RODS    Culture   Final    FEW STAPHYLOCOCCUS AUREUS HOLDING FOR POSSIBLE ANAEROBE [facility]    Report Status PENDING  Incomplete   Organism ID, Bacteria STAPHYLOCOCCUS AUREUS  Final      Susceptibility   Staphylococcus aureus - MIC*    CIPROFLOXACIN >=8 RESISTANT Resistant     ERYTHROMYCIN <=0.25 SENSITIVE Sensitive     GENTAMICIN <=0.5 SENSITIVE Sensitive     OXACILLIN <=0.25 SENSITIVE Sensitive     TETRACYCLINE <=1 SENSITIVE Sensitive     VANCOMYCIN <=0.5 SENSITIVE Sensitive      TRIMETH/SULFA <=10 SENSITIVE Sensitive     CLINDAMYCIN <=0.25 SENSITIVE Sensitive     RIFAMPIN <=0.5 SENSITIVE Sensitive     Inducible Clindamycin NEGATIVE Sensitive     * FEW STAPHYLOCOCCUS AUREUS  Blood Culture (routine x 2)     Status: None (Preliminary result)   Collection Time: 04/15/16 11:37 AM  Result Value Ref Range Status   Specimen Description BLOOD RIGHT ROREARM  Final   Special Requests   Final    BOTTLES DRAWN AEROBIC AND ANAEROBIC AER 15ML ANA 16ML   Culture NO GROWTH 3 DAYS  Final   Report Status PENDING  Incomplete  Blood Culture (routine x 2)     Status: None (Preliminary result)   Collection Time: 04/15/16 11:50 AM  Result Value Ref Range Status   Specimen Description BLOOD LEFT AC  Final   Special Requests   Final    BOTTLES DRAWN AEROBIC AND ANAEROBIC AER 16ML ANA 19ML   Culture NO GROWTH 3 DAYS  Final   Report Status PENDING  Incomplete  Urine culture     Status: Abnormal   Collection Time: 04/15/16 12:04 PM  Result Value Ref Range Status   Specimen Description URINE, RANDOM  Final   Special Requests Normal  Final   Culture >=100,000 COLONIES/mL ESCHERICHIA COLI (A)  Final   Report Status 04/17/2016 FINAL  Final   Organism ID, Bacteria ESCHERICHIA COLI (A)  Final      Susceptibility   Escherichia coli - MIC*    AMPICILLIN 4 SENSITIVE Sensitive     CEFAZOLIN <=4 SENSITIVE Sensitive     CEFTRIAXONE <=1 SENSITIVE Sensitive     CIPROFLOXACIN <=0.25 SENSITIVE Sensitive     GENTAMICIN <=1 SENSITIVE Sensitive     IMIPENEM <=0.25 SENSITIVE Sensitive     NITROFURANTOIN <=16 SENSITIVE Sensitive     TRIMETH/SULFA <=20 SENSITIVE Sensitive     AMPICILLIN/SULBACTAM <=2 SENSITIVE Sensitive     PIP/TAZO <=4 SENSITIVE Sensitive     Extended ESBL NEGATIVE Sensitive     * >=100,000 COLONIES/mL ESCHERICHIA COLI  MRSA PCR Screening     Status: None   Collection Time: 04/15/16  6:28 PM  Result Value Ref Range Status  MRSA by PCR NEGATIVE NEGATIVE Final    Comment:         The GeneXpert MRSA Assay (FDA approved for NASAL specimens only), is one component of a comprehensive MRSA colonization surveillance program. It is not intended to diagnose MRSA infection nor to guide or monitor treatment for MRSA infections.     RADIOLOGY:  No results found.   Management plans discussed with the patient, family and they are in agreement.  CODE STATUS:     Code Status Orders        Start     Ordered   04/15/16 1803  Full code  Continuous     04/15/16 1802    Code Status History    Date Active Date Inactive Code Status Order ID Comments User Context   04/29/2015 10:17 AM 04/30/2015  1:39 PM Full Code [id number]  Linus Galas, MD Inpatient   04/28/2015  4:38 AM 04/29/2015 10:17 AM Full Code [id number]  Milagros Loll, MD ED      TOTAL TIME TAKING CARE OF THIS PATIENT: 43 minutes.    Shaune Pollack M.D on 04/18/2016 at 1:54 PM  Between 7am to 6pm - Pager - [phone]  After 6pm go to [email or web address] - Social research officer, government  [facility] [place] [facility]  Office  [phone]  CC: Primary care physician; [facility]   Note: This dictation was prepared with Dragon dictation along with smaller phrase technology. Any transcriptional errors that result from this process are unintentional.

## 2016-04-18 NOTE — Care Management Important Message (Signed)
Important Message  Patient Details  Name: Jonathon York MRN: [id number] Date of Birth: December 27, 1975   Medicare Important Message Given:  Yes    Chapman FitchBOWEN, Johnney Scarlata T, RN 04/18/2016, 4:38 PM

## 2016-04-18 NOTE — Consult Note (Signed)
[name]   Reason for Consult:  Consult for 40 year old man with history of paraplegia and depression. Referring Physician:  Posey Pronto [name] MRN:  [id number] Principal Diagnosis: Severe recurrent major depression without psychotic features [facility]) Diagnosis:   Patient Active Problem List   Diagnosis Date Noted  . Severe recurrent major depression without psychotic features ([place]) [F33.2] 04/16/2016  . Decubitus ulcer of sacral region [L89.159]   . Sepsis ([place]) [A41.9] 04/15/2016  . 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 Pressure ulcer stage III [L89.93] 04/30/2015  . Sterile pyuria [N39.0] 04/30/2015  . Paraplegia ([place]) [G82.20] 04/30/2015  . Neurogenic bladder [N31.9] 04/30/2015  . 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 Malnutrition of moderate degree [E44.0] 04/28/2015    Total Time spent with patient: 20 minutes  Follow-up Thursday the 21st. Patient still depressed but seems to be feeling a little better. Less irritable. More relaxed. Agrees to the plan for discharge probably tomorrow. Denies acute suicidal intent or plan.  Subjective:   Jonathon York is a 40 y.o. male patient admitted with "I've been real sick".  HPI:  Patient interviewed. Chart reviewed. This is a 40 year old man with paraplegia in the hospital with worsening of his decubitus ulcers and sepsis. Concern raised about depression. Patient tells me that he stays depressed most of the time and has been for probably years. It comes and goes but recently has been worse. He feels tired and run down and hopeless and sleeps all the time. His appetite is poor. He says he has intermittent suicidal thoughts. Does not have any current plan or intent to act on it. He has a ongoing use of marijuana. Not on any medicine however currently for depression and hasn't been for years. Major stresses are living alone social isolation  severe medical problems  Medical history: Patient is paraplegic. Has had a decubitus ulcer that has not healed for a long time. Came into the hospital with sepsis as well.  Social history: Lives by himself. He has sort of run off all of his family. He has daughters and other family but doesn't get along very well with them. Stays socially isolated.  Substance abuse history: Admits that he uses marijuana regularly. Doesn't use any other drugs currently doesn't drink.  Past Psychiatric History: Patient has a history of recurrent depression. History of suicidal ideation. Says that he's tried to kill himself "many times" in the past. Doesn't give much detail about it. Last time I saw the patient was about 4 years ago when he was depressed then. He hasn't been on antidepressants in the long time. Doesn't remember whether they ever really worked.  Risk to Self: Is patient at risk for suicide?: No Risk to Others:   Prior Inpatient Therapy:   Prior Outpatient Therapy:    Past Medical History:  Past Medical History:  Diagnosis Date  . Paraparesis of both lower limbs ([place]) 02/21/00    Past Surgical History:  Procedure Laterality Date  . AMPUTATION TOE Right 04/29/2015   Procedure: AMPUTATION TOE;  Surgeon: Sharlotte Alamo, MD;  Location: [facility] ORS;  Service: Podiatry;  Laterality: Right;  . IRRIGATION AND DEBRIDEMENT BUTTOCKS    . TOE AMPUTATION Right    Family History:  Family History  Problem Relation Age of Onset  . Lymphoma Sister    Family Psychiatric  History: None Social History:  History  Alcohol Use No     History  Drug Use  .  Types: Marijuana    Social History   Social History  . Marital status: Single    Spouse name: N/A  . Number of children: N/A  . Years of education: N/A   Social History Main Topics  . Smoking status: Former Smoker    Packs/day: 0.00    Types: Cigarettes  . Smokeless tobacco: Never Used  . Alcohol use No  . Drug use:     Types: Marijuana  .  Sexual activity: Not Asked   Other Topics Concern  . None   Social History Narrative  . None   Additional Social History:    Allergies:  No Known Allergies  Labs:  Results for orders placed or performed during the hospital encounter of 04/15/16 (from the past 48 hour(s))  CBC     Status: Abnormal   Collection Time: 04/17/16  8:15 AM  Result Value Ref Range   WBC 7.2 3.8 - 10.6 K/uL   RBC 3.77 (L) 4.40 - 5.90 MIL/uL   Hemoglobin 7.1 (L) 13.0 - 18.0 g/dL   HCT 23.2 (L) 40.0 - 52.0 %   MCV 61.6 (L) 80.0 - 100.0 fL   MCH 18.9 (L) 26.0 - 34.0 pg   MCHC 30.6 (L) 32.0 - 36.0 g/dL   RDW 19.5 (H) 11.5 - 14.5 %   Platelets 496 (H) 150 - 440 K/uL  Basic metabolic panel     Status: Abnormal   Collection Time: 04/17/16  8:15 AM  Result Value Ref Range   Sodium 140 135 - 145 mmol/L   Potassium 3.8 3.5 - 5.1 mmol/L   Chloride 109 101 - 111 mmol/L   CO2 25 22 - 32 mmol/L   Glucose, Bld 88 65 - 99 mg/dL   BUN <5 (L) 6 - 20 mg/dL   Creatinine, Ser 0.39 (L) 0.61 - 1.24 mg/dL   Calcium 8.1 (L) 8.9 - 10.3 mg/dL   GFR calc non Af Amer >60 >60 mL/min   GFR calc Af Amer >60 >60 mL/min    Comment: (NOTE) The eGFR has been calculated using the CKD EPI equation. This calculation has not been validated in all clinical situations. eGFR's persistently <60 mL/min signify possible Chronic Kidney Disease.    Anion gap 6 5 - 15  Vancomycin, trough     Status: Abnormal   Collection Time: 04/17/16  8:15 AM  Result Value Ref Range   Vancomycin Tr 6 (L) 15 - 20 ug/mL  ABO/Rh     Status: None   Collection Time: 04/17/16  8:15 AM  Result Value Ref Range   ABO/RH(D) O POS   Type and screen [place]     Status: None   Collection Time: 04/17/16  9:52 AM  Result Value Ref Range   ABO/RH(D) O POS    Antibody Screen NEG    Sample Expiration 04/20/2016    Unit Number [id number]    Blood Component Type RED CELLS,LR    Unit division 00    Status of Unit ISSUED,FINAL     Transfusion Status OK TO TRANSFUSE    Crossmatch Result Compatible   Prepare RBC     Status: None   Collection Time: 04/17/16 10:09 AM  Result Value Ref Range   Order Confirmation ORDER PROCESSED BY BLOOD BANK   CBC     Status: Abnormal   Collection Time: 04/18/16  4:52 AM  Result Value Ref Range   WBC 7.1 3.8 - 10.6 K/uL   RBC 4.27 (L) 4.40 -  5.90 MIL/uL   Hemoglobin 8.6 (L) 13.0 - 18.0 g/dL   HCT 27.3 (L) 40.0 - 52.0 %   MCV 64.0 (L) 80.0 - 100.0 fL   MCH 20.1 (L) 26.0 - 34.0 pg   MCHC 31.3 (L) 32.0 - 36.0 g/dL   RDW 22.0 (H) 11.5 - 14.5 %   Platelets 498 (H) 150 - 440 K/uL  Basic metabolic panel     Status: Abnormal   Collection Time: 04/18/16  4:52 AM  Result Value Ref Range   Sodium 142 135 - 145 mmol/L   Potassium 3.8 3.5 - 5.1 mmol/L   Chloride 112 (H) 101 - 111 mmol/L   CO2 25 22 - 32 mmol/L   Glucose, Bld 93 65 - 99 mg/dL   BUN 8 6 - 20 mg/dL   Creatinine, Ser 0.45 (L) 0.61 - 1.24 mg/dL   Calcium 8.4 (L) 8.9 - 10.3 mg/dL   GFR calc non Af Amer >60 >60 mL/min   GFR calc Af Amer >60 >60 mL/min    Comment: (NOTE) The eGFR has been calculated using the CKD EPI equation. This calculation has not been validated in all clinical situations. eGFR's persistently <60 mL/min signify possible Chronic Kidney Disease.    Anion gap 5 5 - 15    Current Facility-Administered Medications  Medication Dose Route Frequency Provider Last Rate Last Dose  . acetaminophen (TYLENOL) tablet 650 mg  650 mg Oral Q6H PRN Demetrios Loll, MD       Or  . acetaminophen (TYLENOL) suppository 650 mg  650 mg Rectal Q6H PRN Demetrios Loll, MD      . albuterol (PROVENTIL) (2.5 MG/3ML) 0.083% nebulizer solution 2.5 mg  2.5 mg Nebulization Q2H PRN Demetrios Loll, MD      . bisacodyl (DULCOLAX) EC tablet 5 mg  5 mg Oral Daily PRN Demetrios Loll, MD      . buPROPion (WELLBUTRIN XL) 24 hr tablet 300 mg  300 mg Oral Daily Gonzella Lex, MD   300 mg at 04/18/16 1036  . diphenhydrAMINE (BENADRYL) capsule 25 mg  25 mg Oral Q6H  PRN Gladstone Lighter, MD   25 mg at 04/15/16 2055  . enoxaparin (LOVENOX) injection 30 mg  30 mg Subcutaneous Q24H Dustin Flock, MD      . feeding supplement (ENSURE ENLIVE) (ENSURE ENLIVE) liquid 237 mL  237 mL Oral TID BM Dustin Flock, MD   237 mL at 04/18/16 1341  . HYDROcodone-acetaminophen (NORCO/VICODIN) 5-325 MG per tablet 1-2 tablet  1-2 tablet Oral Q4H PRN Demetrios Loll, MD   1 tablet at 04/17/16 1726  . megestrol (MEGACE) 400 MG/10ML suspension 800 mg  800 mg Oral Daily Dustin Flock, MD   800 mg at 04/18/16 1036  . multivitamin with minerals tablet 1 tablet  1 tablet Oral Daily Dustin Flock, MD   1 tablet at 04/18/16 1036  . ondansetron (ZOFRAN) tablet 4 mg  4 mg Oral Q6H PRN Demetrios Loll, MD       Or  . ondansetron [facility]) injection 4 mg  4 mg Intravenous Q6H PRN Demetrios Loll, MD   4 mg at 04/17/16 0526  . oxybutynin (DITROPAN-XL) 24 hr tablet 15 mg  15 mg Oral BID Gladstone Lighter, MD   15 mg at 04/18/16 1035  . piperacillin-tazobactam (ZOSYN) IVPB 3.375 g  3.375 g Intravenous Q8H Melissa D Maccia, RPH   3.375 g at 04/18/16 0538  . sodium hypochlorite (DAKIN'S 1/4 STRENGTH) topical solution   Irrigation BID Dustin Flock,  MD      . vancomycin (VANCOCIN) IVPB 750 mg/150 ml premix  750 mg Intravenous Q12H Dustin Flock, MD   750 mg at 04/18/16 1035    Musculoskeletal: Strength & Muscle Tone: decreased Gait & Station: unable to stand Patient leans: N/A  Psychiatric Specialty Exam: Physical Exam  Nursing note and vitals reviewed. Constitutional: He appears well-developed.  HENT:  Head: Normocephalic and atraumatic.  Eyes: Conjunctivae are normal. Pupils are equal, round, and reactive to light.  Neck: Normal range of motion.  Cardiovascular: Normal heart sounds.   Respiratory: Effort normal.  GI: Soft.  Musculoskeletal:       Legs: Neurological: He is alert.  Skin: Skin is warm and dry.     Psychiatric: Judgment normal. His speech is delayed. He is slowed. Cognition and  memory are normal. He exhibits a depressed mood. He expresses suicidal ideation. He expresses no suicidal plans.    Review of Systems  Constitutional: Positive for fever, malaise/fatigue and weight loss.  HENT: Negative.   Eyes: Negative.   Respiratory: Negative.   Cardiovascular: Negative.   Gastrointestinal: Negative.   Musculoskeletal: Positive for back pain.  Skin: Negative.   Neurological: Negative.   Psychiatric/Behavioral: Positive for depression, substance abuse and suicidal ideas. Negative for hallucinations and memory loss. The patient is not nervous/anxious and does not have insomnia.     Blood pressure 98/62, pulse 82, temperature 98.1 F (36.7 C), temperature source Oral, resp. rate 18, height 5' 9"  (1.753 m), weight 42.7 kg (94 lb 2.2 oz), SpO2 100 %.Body mass index is 13.9 kg/m.  General Appearance: Casual  Eye Contact:  Fair  Speech:  Normal Rate  Volume:  Decreased  Mood:  Depressed  Affect:  Depressed  Thought Process:  Goal Directed  Orientation:  Full (Time, Place, and Person)  Thought Content:  Logical  Suicidal Thoughts:  Yes.  without intent/plan  Homicidal Thoughts:  No  Memory:  Immediate;   Good Recent;   Fair Remote;   Fair  Judgement:  Fair  Insight:  Fair  Psychomotor Activity:  Decreased  Concentration:  Concentration: Fair  Recall:  [employer] of Knowledge:  Fair  Language:  Fair  Akathisia:  No  Handed:  Right  AIMS (if indicated):     Assets:  Communication Skills Desire for Improvement Housing Resilience  ADL's:  Intact  Cognition:  WNL  Sleep:        Treatment Plan Summary: Daily contact with patient to assess and evaluate symptoms and progress in treatment, Medication management and Plan Tolerating increase dose of Wellbutrin. Supportive counseling and review of plan with patient as well as the importance of continued treatment of depression. I will follow-up as needed but he tells me he is likely to be  discharged.  Disposition: Patient does not meet criteria for psychiatric inpatient admission. Supportive therapy provided about ongoing stressors.  Alethia Berthold, MD 04/18/2016 6:37 PM

## 2016-04-18 NOTE — Progress Notes (Signed)
Patient discharged to [facility] and report called to Jonathon MilletLydia Young RN, [facility] called to transport patient to [facility]. Patient is alert and oriented dressing to sacral area clean dry and intact.

## 2016-04-18 NOTE — Care Management (Signed)
After further discussion of 6 weeks of home IV antibiotics and daily dressing changes, Patient has now reconsidered and wishes to transfer to [facility].  [facility] Liaison notified.  Patient to transfer to [facility] room 314. Bedside RN to call report.  RNCM signing off

## 2016-04-18 NOTE — Progress Notes (Signed)
[facility] here to transport patient to [facility]. Report given. Pt A&O, resting comfortably with no complaints, VSS, IVs removed. Dressings changed to pressure ulcers this shift.

## 2016-04-18 NOTE — Consult Note (Signed)
[facility] Infectious Disease     Reason for Consult: Wound infection    Referring Physician: Estanislado Spire Date of Admission:  04/15/2016   Principal Problem:   Severe recurrent major depression without psychotic features ([place]) Active Problems:   Sepsis ([place])   Decubitus ulcer of sacral region   HPI: Jonathon York is a 40 y.o. male With a history of paraplegia admitted with a urinary tract infection and wound infection.  He has a long history of wounds some pressure ulcers.  He has seen numerous providers and physicians for this.  On admission he was seen by surgery and it was felt no immediate debridement was needed for the sacral and coccygeal decubitus ulcers.  It was felt he would best be served by flap closure. Patient is largely wheelchair-bound.  Says has dealt with this would for several years. Not having any wound care currently. Applying vinegar and salt to the wound. Not on any abx currently. Quit smoking 2 months ago. Lives alone  Culture from his current wound on December 18 shows staph aureus methicillin sensitive as well as possible anaerobe.  Gram stain also showed gram-positive rods and gram-negative rods  Past Medical History:  Diagnosis Date  . Paraparesis of both lower limbs ([place]) 02/21/00   Past Surgical History:  Procedure Laterality Date  . AMPUTATION TOE Right 04/29/2015   Procedure: AMPUTATION TOE;  Surgeon: Sharlotte Alamo, MD;  Location: [facility] ORS;  Service: Podiatry;  Laterality: Right;  . IRRIGATION AND DEBRIDEMENT BUTTOCKS    . TOE AMPUTATION Right    Social History  Substance Use Topics  . Smoking status: Former Smoker    Packs/day: 0.00    Types: Cigarettes  . Smokeless tobacco: Never Used  . Alcohol use No   Family History  Problem Relation Age of Onset  . Lymphoma Sister     Allergies: No Known Allergies  Current antibiotics: Antibiotics Given (last 72 hours)    Date/Time Action Medication Dose Rate   04/16/16 0446 Given    piperacillin-tazobactam (ZOSYN) IVPB 3.375 g 3.375 g 12.5 mL/hr   04/16/16 0810 Given   vancomycin (VANCOCIN) IVPB 750 mg/150 ml premix 750 mg 150 mL/hr   04/16/16 1315 Given   piperacillin-tazobactam (ZOSYN) IVPB 3.375 g 3.375 g 12.5 mL/hr   04/16/16 2024 Given   piperacillin-tazobactam (ZOSYN) IVPB 3.375 g 3.375 g 12.5 mL/hr   04/16/16 2154 Given   vancomycin (VANCOCIN) 500 mg in sodium chloride 0.9 % 100 mL IVPB 500 mg 100 mL/hr   04/17/16 0456 Given   piperacillin-tazobactam (ZOSYN) IVPB 3.375 g 3.375 g 12.5 mL/hr   04/17/16 1006 Given   vancomycin (VANCOCIN) IVPB 750 mg/150 ml premix 750 mg 150 mL/hr   04/17/16 1200 Given   piperacillin-tazobactam (ZOSYN) IVPB 3.375 g 3.375 g 12.5 mL/hr   04/17/16 2120 Given   piperacillin-tazobactam (ZOSYN) IVPB 3.375 g 3.375 g 12.5 mL/hr   04/17/16 2130 Given   vancomycin (VANCOCIN) IVPB 750 mg/150 ml premix 750 mg 150 mL/hr   04/18/16 0538 Given   piperacillin-tazobactam (ZOSYN) IVPB 3.375 g 3.375 g 12.5 mL/hr   04/18/16 1035 Given   vancomycin (VANCOCIN) IVPB 750 mg/150 ml premix 750 mg 150 mL/hr      MEDICATIONS: . buPROPion  300 mg Oral Daily  . enoxaparin (LOVENOX) injection  30 mg Subcutaneous Q24H  . feeding supplement (ENSURE ENLIVE)  237 mL Oral TID BM  . megestrol  800 mg Oral Daily  . multivitamin with minerals  1 tablet Oral  Daily  . oxybutynin  15 mg Oral BID  . piperacillin-tazobactam (ZOSYN)  IV  3.375 g Intravenous Q8H  . sodium hypochlorite   Irrigation BID  . vancomycin  750 mg Intravenous Q12H    Review of Systems - 11 systems reviewed and negative per HPI   OBJECTIVE: Temp:  [97.8 F (36.6 C)-98.2 F (36.8 C)] 98.1 F (36.7 C) (12/21 0508) Pulse Rate:  [76-97] 81 (12/21 0546) Resp:  [14-18] 18 (12/21 0508) BP: (86-102)/(49-63) 94/59 (12/21 0546) SpO2:  [94 %-100 %] 100 % (12/21 7017) Physical Exam  Constitutional: He is oriented to person, place, and time. Thin, muscular upeer bod HENT:  anicteric Mouth/Throat: Oropharynx is clear. No oropharyngeal exudate.  Cardiovascular: Normal rate, regular rhythm and normal heart sounds. \Pulmonary/Chest: Effort normal and breath sounds normal. No respiratory distress. He has no wheezes.  Abdominal: Soft. Bowel sounds are normal. He exhibits no distension. There is no tenderness.  Lymphadenopathy: He has no cervical adenopathy.  Neurological: He is alert and oriented to person, place, and time. LE paraplegia Skin: very large but relatively shallow sacral ulcer, with exposed muscle but clean base. No exposed bone. Some undermining proximally Psychiatric: He has a normal mood and affect. His behavior is normal.     LABS: Results for orders placed or performed during the hospital encounter of 04/15/16 (from the past 48 hour(s))  CBC     Status: Abnormal   Collection Time: 04/17/16  8:15 AM  Result Value Ref Range   WBC 7.2 3.8 - 10.6 K/uL   RBC 3.77 (L) 4.40 - 5.90 MIL/uL   Hemoglobin 7.1 (L) 13.0 - 18.0 g/dL   HCT 23.2 (L) 40.0 - 52.0 %   MCV 61.6 (L) 80.0 - 100.0 fL   MCH 18.9 (L) 26.0 - 34.0 pg   MCHC 30.6 (L) 32.0 - 36.0 g/dL   RDW 19.5 (H) 11.5 - 14.5 %   Platelets 496 (H) 150 - 440 K/uL  Basic metabolic panel     Status: Abnormal   Collection Time: 04/17/16  8:15 AM  Result Value Ref Range   Sodium 140 135 - 145 mmol/L   Potassium 3.8 3.5 - 5.1 mmol/L   Chloride 109 101 - 111 mmol/L   CO2 25 22 - 32 mmol/L   Glucose, Bld 88 65 - 99 mg/dL   BUN <5 (L) 6 - 20 mg/dL   Creatinine, Ser 0.39 (L) 0.61 - 1.24 mg/dL   Calcium 8.1 (L) 8.9 - 10.3 mg/dL   GFR calc non Af Amer >60 >60 mL/min   GFR calc Af Amer >60 >60 mL/min    Comment: (NOTE) The eGFR has been calculated using the CKD EPI equation. This calculation has not been validated in all clinical situations. eGFR's persistently <60 mL/min signify possible Chronic Kidney Disease.    Anion gap 6 5 - 15  Vancomycin, trough     Status: Abnormal   Collection Time: 04/17/16   8:15 AM  Result Value Ref Range   Vancomycin Tr 6 (L) 15 - 20 ug/mL  ABO/Rh     Status: None   Collection Time: 04/17/16  8:15 AM  Result Value Ref Range   ABO/RH(D) O POS   Type and screen [place]     Status: None   Collection Time: 04/17/16  9:52 AM  Result Value Ref Range   ABO/RH(D) O POS    Antibody Screen NEG    Sample Expiration 04/20/2016    Unit Number [id number]  Blood Component Type RED CELLS,LR    Unit division 00    Status of Unit ISSUED,FINAL    Transfusion Status OK TO TRANSFUSE    Crossmatch Result Compatible   Prepare RBC     Status: None   Collection Time: 04/17/16 10:09 AM  Result Value Ref Range   Order Confirmation ORDER PROCESSED BY BLOOD BANK   CBC     Status: Abnormal   Collection Time: 04/18/16  4:52 AM  Result Value Ref Range   WBC 7.1 3.8 - 10.6 K/uL   RBC 4.27 (L) 4.40 - 5.90 MIL/uL   Hemoglobin 8.6 (L) 13.0 - 18.0 g/dL   HCT 27.3 (L) 40.0 - 52.0 %   MCV 64.0 (L) 80.0 - 100.0 fL   MCH 20.1 (L) 26.0 - 34.0 pg   MCHC 31.3 (L) 32.0 - 36.0 g/dL   RDW 22.0 (H) 11.5 - 14.5 %   Platelets 498 (H) 150 - 440 K/uL  Basic metabolic panel     Status: Abnormal   Collection Time: 04/18/16  4:52 AM  Result Value Ref Range   Sodium 142 135 - 145 mmol/L   Potassium 3.8 3.5 - 5.1 mmol/L   Chloride 112 (H) 101 - 111 mmol/L   CO2 25 22 - 32 mmol/L   Glucose, Bld 93 65 - 99 mg/dL   BUN 8 6 - 20 mg/dL   Creatinine, Ser 0.45 (L) 0.61 - 1.24 mg/dL   Calcium 8.4 (L) 8.9 - 10.3 mg/dL   GFR calc non Af Amer >60 >60 mL/min   GFR calc Af Amer >60 >60 mL/min    Comment: (NOTE) The eGFR has been calculated using the CKD EPI equation. This calculation has not been validated in all clinical situations. eGFR's persistently <60 mL/min signify possible Chronic Kidney Disease.    Anion gap 5 5 - 15   No components found for: ESR, C REACTIVE PROTEIN MICRO: Recent Results (from the past 720 hour(s))  Aerobic/Anaerobic Culture  (surgical/deep wound)     Status: None (Preliminary result)   Collection Time: 04/15/16 11:37 AM  Result Value Ref Range Status   Specimen Description SACRAL  Final   Special Requests Normal  Final   Gram Stain   Final    RARE WBC PRESENT, PREDOMINANTLY PMN FEW GRAM POSITIVE COCCI IN PAIRS AND CHAINS RARE GRAM POSITIVE RODS RARE GRAM NEGATIVE RODS    Culture   Final    FEW STAPHYLOCOCCUS AUREUS HOLDING FOR POSSIBLE ANAEROBE Performed at [facility]    Report Status PENDING  Incomplete   Organism ID, Bacteria STAPHYLOCOCCUS AUREUS  Final      Susceptibility   Staphylococcus aureus - MIC*    CIPROFLOXACIN >=8 RESISTANT Resistant     ERYTHROMYCIN <=0.25 SENSITIVE Sensitive     GENTAMICIN <=0.5 SENSITIVE Sensitive     OXACILLIN <=0.25 SENSITIVE Sensitive     TETRACYCLINE <=1 SENSITIVE Sensitive     VANCOMYCIN <=0.5 SENSITIVE Sensitive     TRIMETH/SULFA <=10 SENSITIVE Sensitive     CLINDAMYCIN <=0.25 SENSITIVE Sensitive     RIFAMPIN <=0.5 SENSITIVE Sensitive     Inducible Clindamycin NEGATIVE Sensitive     * FEW STAPHYLOCOCCUS AUREUS  Blood Culture (routine x 2)     Status: None (Preliminary result)   Collection Time: 04/15/16 11:37 AM  Result Value Ref Range Status   Specimen Description BLOOD RIGHT ROREARM  Final   Special Requests   Final    BOTTLES DRAWN AEROBIC AND ANAEROBIC AER 15ML  ANA 16ML   Culture NO GROWTH 3 DAYS  Final   Report Status PENDING  Incomplete  Blood Culture (routine x 2)     Status: None (Preliminary result)   Collection Time: 04/15/16 11:50 AM  Result Value Ref Range Status   Specimen Description BLOOD LEFT AC  Final   Special Requests   Final    BOTTLES DRAWN AEROBIC AND ANAEROBIC AER 16ML ANA 19ML   Culture NO GROWTH 3 DAYS  Final   Report Status PENDING  Incomplete  Urine culture     Status: Abnormal   Collection Time: 04/15/16 12:04 PM  Result Value Ref Range Status   Specimen Description URINE, RANDOM  Final   Special Requests Normal   Final   Culture >=100,000 COLONIES/mL ESCHERICHIA COLI (A)  Final   Report Status 04/17/2016 FINAL  Final   Organism ID, Bacteria ESCHERICHIA COLI (A)  Final      Susceptibility   Escherichia coli - MIC*    AMPICILLIN 4 SENSITIVE Sensitive     CEFAZOLIN <=4 SENSITIVE Sensitive     CEFTRIAXONE <=1 SENSITIVE Sensitive     CIPROFLOXACIN <=0.25 SENSITIVE Sensitive     GENTAMICIN <=1 SENSITIVE Sensitive     IMIPENEM <=0.25 SENSITIVE Sensitive     NITROFURANTOIN <=16 SENSITIVE Sensitive     TRIMETH/SULFA <=20 SENSITIVE Sensitive     AMPICILLIN/SULBACTAM <=2 SENSITIVE Sensitive     PIP/TAZO <=4 SENSITIVE Sensitive     Extended ESBL NEGATIVE Sensitive     * >=100,000 COLONIES/mL ESCHERICHIA COLI  MRSA PCR Screening     Status: None   Collection Time: 04/15/16  6:28 PM  Result Value Ref Range Status   MRSA by PCR NEGATIVE NEGATIVE Final    Comment:        The GeneXpert MRSA Assay (FDA approved for NASAL specimens only), is one component of a comprehensive MRSA colonization surveillance program. It is not intended to diagnose MRSA infection nor to guide or monitor treatment for MRSA infections.     IMAGING: Dg Chest Port 1 View  Result Date: 04/15/2016 CLINICAL DATA:  Sepsis. EXAM: PORTABLE CHEST 1 VIEW COMPARISON:  Radiographs of December 24, 2013. FINDINGS: The heart size and mediastinal contours are within normal limits. Both lungs are clear. No pneumothorax or pleural effusion is noted. Status post surgical fixation of lower thoracic spine. IMPRESSION: No acute cardiopulmonary abnormality seen. Electronically Signed   By: Marijo Conception, M.D.   On: 04/15/2016 11:47    Assessment:   Jonathon York is a 40 y.o. malewith paraplegia and a large sacral ulcer present for several years.  The wound is extensive and quite near but not involving the rectum.  There is relatively clean base but some undermining. I see no exposed bone. WBC nml, no fevers.  He will need extensive long term  care to heal this wound. For now would benefit from 4-6 weeks IV abx to treat infection, try to encourage healing and prepare prior to evaluation by plastic surgery for potential flap. However given the current size of wound not sure there is enough tissue for flap coverage.   Recommendations Place picc Home IV abx with zosyn for 4-6 weeks Esr and CRP ordered Once I have seen In fu will work to get him into [facility] for evaluation Advised him to avoid tobacco which will make healing worse Advised increase protein intake with protein shakes or powder.   Thank you very much for allowing me to participate in the  care of this patient. Please call with questions.   Cheral Marker. Ola Spurr, MD

## 2016-04-18 NOTE — Progress Notes (Signed)
Still waiting for transport to [facility], Patient is alert and oriented no acute distress noted. Dressing changed to sacral area, this afternoon, dressing dry and intact.

## 2016-04-18 NOTE — Progress Notes (Addendum)
Infectious Disease Long Term IV Antibiotic Orders  Diagnosis:Paraplegia, Sacral decub  Culture results MSSA   Allergies: No Known Allergies  Discharge antibiotics  Zosyn  3.375 grams every 8 hours  PICC Care per protocol Labs weekly while on IV antibiotics      CBC w diff   Comprehensive met panel ESR CRP   Planned duration of antibiotics 6 weeks  Stop date May 30, 2016  Follow up clinic date within 4 weeks  FAX weekly labs to [phone]   P , MD  

## 2016-04-19 NOTE — Progress Notes (Signed)
Notified sizewise that patient no longer using bed. Confirmation # for stop services [id number].

## 2016-04-20 DIAGNOSIS — L89309 Pressure ulcer of unspecified buttock, unspecified stage: Secondary | ICD-10-CM | POA: Diagnosis not present

## 2016-04-20 DIAGNOSIS — N390 Urinary tract infection, site not specified: Secondary | ICD-10-CM | POA: Diagnosis not present

## 2016-04-20 DIAGNOSIS — A419 Sepsis, unspecified organism: Secondary | ICD-10-CM | POA: Diagnosis not present

## 2016-04-20 LAB — CULTURE, BLOOD (ROUTINE X 2)
Culture: NO GROWTH
Culture: NO GROWTH

## 2016-04-21 DIAGNOSIS — N390 Urinary tract infection, site not specified: Secondary | ICD-10-CM | POA: Diagnosis not present

## 2016-04-21 DIAGNOSIS — A419 Sepsis, unspecified organism: Secondary | ICD-10-CM | POA: Diagnosis not present

## 2016-04-21 DIAGNOSIS — L89309 Pressure ulcer of unspecified buttock, unspecified stage: Secondary | ICD-10-CM | POA: Diagnosis not present

## 2016-04-22 DIAGNOSIS — L89309 Pressure ulcer of unspecified buttock, unspecified stage: Secondary | ICD-10-CM | POA: Diagnosis not present

## 2016-04-22 DIAGNOSIS — A419 Sepsis, unspecified organism: Secondary | ICD-10-CM | POA: Diagnosis not present

## 2016-04-22 DIAGNOSIS — N390 Urinary tract infection, site not specified: Secondary | ICD-10-CM | POA: Diagnosis not present

## 2016-04-22 LAB — AEROBIC/ANAEROBIC CULTURE (SURGICAL/DEEP WOUND)

## 2016-04-22 LAB — AEROBIC/ANAEROBIC CULTURE W GRAM STAIN (SURGICAL/DEEP WOUND): Special Requests: NORMAL

## 2016-04-23 DIAGNOSIS — N390 Urinary tract infection, site not specified: Secondary | ICD-10-CM | POA: Diagnosis not present

## 2016-04-23 DIAGNOSIS — L89309 Pressure ulcer of unspecified buttock, unspecified stage: Secondary | ICD-10-CM | POA: Diagnosis not present

## 2016-04-23 DIAGNOSIS — A419 Sepsis, unspecified organism: Secondary | ICD-10-CM | POA: Diagnosis not present

## 2016-04-24 DIAGNOSIS — L89309 Pressure ulcer of unspecified buttock, unspecified stage: Secondary | ICD-10-CM | POA: Diagnosis not present

## 2016-04-24 DIAGNOSIS — A419 Sepsis, unspecified organism: Secondary | ICD-10-CM | POA: Diagnosis not present

## 2016-04-24 DIAGNOSIS — N390 Urinary tract infection, site not specified: Secondary | ICD-10-CM | POA: Diagnosis not present

## 2016-04-25 DIAGNOSIS — N390 Urinary tract infection, site not specified: Secondary | ICD-10-CM | POA: Diagnosis not present

## 2016-04-25 DIAGNOSIS — L89309 Pressure ulcer of unspecified buttock, unspecified stage: Secondary | ICD-10-CM | POA: Diagnosis not present

## 2016-04-25 DIAGNOSIS — A419 Sepsis, unspecified organism: Secondary | ICD-10-CM | POA: Diagnosis not present

## 2016-04-26 DIAGNOSIS — N390 Urinary tract infection, site not specified: Secondary | ICD-10-CM | POA: Diagnosis not present

## 2016-04-26 DIAGNOSIS — L89309 Pressure ulcer of unspecified buttock, unspecified stage: Secondary | ICD-10-CM | POA: Diagnosis not present

## 2016-04-26 DIAGNOSIS — A419 Sepsis, unspecified organism: Secondary | ICD-10-CM | POA: Diagnosis not present

## 2016-04-27 DIAGNOSIS — G8220 Paraplegia, unspecified: Secondary | ICD-10-CM | POA: Diagnosis not present

## 2016-04-27 DIAGNOSIS — L89154 Pressure ulcer of sacral region, stage 4: Secondary | ICD-10-CM | POA: Diagnosis not present

## 2016-04-27 DIAGNOSIS — A419 Sepsis, unspecified organism: Secondary | ICD-10-CM | POA: Diagnosis not present

## 2016-04-28 DIAGNOSIS — L89154 Pressure ulcer of sacral region, stage 4: Secondary | ICD-10-CM | POA: Diagnosis not present

## 2016-04-28 DIAGNOSIS — A419 Sepsis, unspecified organism: Secondary | ICD-10-CM | POA: Diagnosis not present

## 2016-04-28 DIAGNOSIS — G8220 Paraplegia, unspecified: Secondary | ICD-10-CM | POA: Diagnosis not present

## 2016-06-11 ENCOUNTER — Ambulatory Visit: Payer: Medicare Other | Admitting: Internal Medicine

## 2016-06-17 ENCOUNTER — Ambulatory Visit: Payer: Medicare Other | Admitting: Surgery

## 2016-11-19 ENCOUNTER — Emergency Department: Payer: Medicare Other

## 2016-11-19 ENCOUNTER — Inpatient Hospital Stay
Admission: EM | Admit: 2016-11-19 | Discharge: 2016-11-21 | DRG: 871 | Disposition: A | Discharge status: Other Acute Inpatient Hospital | Payer: Medicare Other | Attending: Internal Medicine | Admitting: Internal Medicine

## 2016-11-19 DIAGNOSIS — F17210 Nicotine dependence, cigarettes, uncomplicated: Secondary | ICD-10-CM | POA: Diagnosis present

## 2016-11-19 DIAGNOSIS — Z681 Body mass index (BMI) 19 or less, adult: Secondary | ICD-10-CM

## 2016-11-19 DIAGNOSIS — M869 Osteomyelitis, unspecified: Secondary | ICD-10-CM

## 2016-11-19 DIAGNOSIS — L89303 Pressure ulcer of unspecified buttock, stage 3: Secondary | ICD-10-CM

## 2016-11-19 DIAGNOSIS — L89154 Pressure ulcer of sacral region, stage 4: Secondary | ICD-10-CM | POA: Diagnosis present

## 2016-11-19 DIAGNOSIS — L89153 Pressure ulcer of sacral region, stage 3: Secondary | ICD-10-CM | POA: Diagnosis not present

## 2016-11-19 DIAGNOSIS — Z87828 Personal history of other (healed) physical injury and trauma: Secondary | ICD-10-CM | POA: Diagnosis not present

## 2016-11-19 DIAGNOSIS — F411 Generalized anxiety disorder: Secondary | ICD-10-CM | POA: Diagnosis present

## 2016-11-19 DIAGNOSIS — L03317 Cellulitis of buttock: Secondary | ICD-10-CM | POA: Diagnosis present

## 2016-11-19 DIAGNOSIS — L89314 Pressure ulcer of right buttock, stage 4: Secondary | ICD-10-CM | POA: Diagnosis present

## 2016-11-19 DIAGNOSIS — L8990 Pressure ulcer of unspecified site, unspecified stage: Secondary | ICD-10-CM | POA: Diagnosis present

## 2016-11-19 DIAGNOSIS — R627 Adult failure to thrive: Secondary | ICD-10-CM | POA: Diagnosis present

## 2016-11-19 DIAGNOSIS — A6001 Herpesviral infection of penis: Secondary | ICD-10-CM | POA: Diagnosis present

## 2016-11-19 DIAGNOSIS — A419 Sepsis, unspecified organism: Principal | ICD-10-CM

## 2016-11-19 DIAGNOSIS — N39490 Overflow incontinence: Secondary | ICD-10-CM | POA: Diagnosis present

## 2016-11-19 DIAGNOSIS — F329 Major depressive disorder, single episode, unspecified: Secondary | ICD-10-CM | POA: Diagnosis present

## 2016-11-19 DIAGNOSIS — E43 Unspecified severe protein-calorie malnutrition: Secondary | ICD-10-CM | POA: Diagnosis present

## 2016-11-19 DIAGNOSIS — Z807 Family history of other malignant neoplasms of lymphoid, hematopoietic and related tissues: Secondary | ICD-10-CM

## 2016-11-19 DIAGNOSIS — N319 Neuromuscular dysfunction of bladder, unspecified: Secondary | ICD-10-CM | POA: Diagnosis present

## 2016-11-19 DIAGNOSIS — R509 Fever, unspecified: Secondary | ICD-10-CM | POA: Diagnosis present

## 2016-11-19 DIAGNOSIS — L89323 Pressure ulcer of left buttock, stage 3: Secondary | ICD-10-CM | POA: Diagnosis not present

## 2016-11-19 DIAGNOSIS — N509 Disorder of male genital organs, unspecified: Secondary | ICD-10-CM | POA: Diagnosis present

## 2016-11-19 DIAGNOSIS — G8220 Paraplegia, unspecified: Secondary | ICD-10-CM | POA: Diagnosis present

## 2016-11-19 DIAGNOSIS — L89159 Pressure ulcer of sacral region, unspecified stage: Secondary | ICD-10-CM

## 2016-11-19 HISTORY — DX: Anxiety disorder, unspecified: F41.9

## 2016-11-19 HISTORY — DX: Neuromuscular dysfunction of bladder, unspecified: N31.9

## 2016-11-19 HISTORY — DX: Major depressive disorder, single episode, unspecified: F32.9

## 2016-11-19 HISTORY — DX: Osteomyelitis, unspecified: M86.9

## 2016-11-19 HISTORY — DX: Pressure ulcer of unspecified site, unspecified stage: L89.90

## 2016-11-19 HISTORY — DX: Depression, unspecified: F32.A

## 2016-11-19 LAB — CBC WITH DIFFERENTIAL/PLATELET
BASOS ABS: 0.1 10*3/uL (ref 0–0.1)
BASOS PCT: 1 %
EOS ABS: 0 10*3/uL (ref 0–0.7)
EOS PCT: 0 %
HCT: 33.7 % — ABNORMAL LOW (ref 40.0–52.0)
Hemoglobin: 10.2 g/dL — ABNORMAL LOW (ref 13.0–18.0)
Lymphocytes Relative: 8 %
Lymphs Abs: 1 10*3/uL (ref 1.0–3.6)
MCH: 19.3 pg — ABNORMAL LOW (ref 26.0–34.0)
MCHC: 30.4 g/dL — ABNORMAL LOW (ref 32.0–36.0)
MCV: 63.4 fL — ABNORMAL LOW (ref 80.0–100.0)
MONO ABS: 0.9 10*3/uL (ref 0.2–1.0)
MONOS PCT: 7 %
Neutro Abs: 11.5 10*3/uL — ABNORMAL HIGH (ref 1.4–6.5)
Neutrophils Relative %: 84 %
PLATELETS: 435 10*3/uL (ref 150–440)
RBC: 5.31 MIL/uL (ref 4.40–5.90)
RDW: 18.4 % — AB (ref 11.5–14.5)
WBC: 13.6 10*3/uL — ABNORMAL HIGH (ref 3.8–10.6)

## 2016-11-19 LAB — COMPREHENSIVE METABOLIC PANEL
ALBUMIN: 2.9 g/dL — AB (ref 3.5–5.0)
ALK PHOS: 89 U/L (ref 38–126)
ALT: 8 U/L — AB (ref 17–63)
ANION GAP: 15 (ref 5–15)
AST: 11 U/L — ABNORMAL LOW (ref 15–41)
BILIRUBIN TOTAL: 1 mg/dL (ref 0.3–1.2)
BUN: 6 mg/dL (ref 6–20)
CALCIUM: 8.8 mg/dL — AB (ref 8.9–10.3)
CO2: 17 mmol/L — AB (ref 22–32)
CREATININE: 0.7 mg/dL (ref 0.61–1.24)
Chloride: 101 mmol/L (ref 101–111)
GFR calc Af Amer: >60 mL/min (ref >60)
GFR calc non Af Amer: >60 mL/min (ref >60)
Glucose, Bld: 101 mg/dL — ABNORMAL HIGH (ref 65–99)
Potassium: 3.6 mmol/L (ref 3.5–5.1)
SODIUM: 133 mmol/L — AB (ref 135–145)
TOTAL PROTEIN: 8 g/dL (ref 6.5–8.1)

## 2016-11-19 LAB — URINALYSIS, COMPLETE (UACMP) WITH MICROSCOPIC
BACTERIA UA: NONE SEEN
Bilirubin Urine: NEGATIVE
GLUCOSE, UA: NEGATIVE mg/dL
Ketones, ur: 80 mg/dL — AB
NITRITE: NEGATIVE
PH: 5 (ref 5.0–8.0)
Protein, ur: 30 mg/dL — AB
SPECIFIC GRAVITY, URINE: 1.019 (ref 1.005–1.030)

## 2016-11-19 LAB — LACTIC ACID, PLASMA: LACTIC ACID, VENOUS: 1 mmol/L (ref 0.5–1.9)

## 2016-11-19 LAB — PROCALCITONIN: PROCALCITONIN: 0.16 ng/mL

## 2016-11-19 MED ORDER — PIPERACILLIN-TAZOBACTAM 3.375 G IVPB
3.3750 g | Freq: Three times a day (TID) | INTRAVENOUS | Status: DC
  Administered 2016-11-19 – 2016-11-21 (×5): 3.375 g via INTRAVENOUS
  Filled 2016-11-19 (×5): qty 50

## 2016-11-19 MED ORDER — ACETAMINOPHEN 325 MG PO TABS: 650.0000 mg | ORAL_TABLET | Freq: Four times a day (QID) | ORAL | Status: DC | PRN

## 2016-11-19 MED ORDER — OXYBUTYNIN CHLORIDE 5 MG PO TABS
15.0000 mg | ORAL_TABLET | Freq: Two times a day (BID) | ORAL | Status: DC
  Administered 2016-11-19 – 2016-11-21 (×4): 15 mg via ORAL
  Filled 2016-11-19 (×4): qty 3

## 2016-11-19 MED ORDER — ENOXAPARIN SODIUM 40 MG/0.4ML ~~LOC~~ SOLN
40.0000 mg | SUBCUTANEOUS | Status: DC
  Administered 2016-11-19: 40 mg via SUBCUTANEOUS
  Filled 2016-11-19: qty 0.4

## 2016-11-19 MED ORDER — HYDROCODONE-ACETAMINOPHEN 5-325 MG PO TABS
1.0000 | ORAL_TABLET | ORAL | Status: DC | PRN
  Administered 2016-11-19 – 2016-11-21 (×8): 1 via ORAL
  Filled 2016-11-19 (×8): qty 1

## 2016-11-19 MED ORDER — SODIUM CHLORIDE 0.9 % IV BOLUS (SEPSIS)
500.0000 mL | Freq: Once | INTRAVENOUS | Status: AC
  Administered 2016-11-19: 500 mL via INTRAVENOUS

## 2016-11-19 MED ORDER — MEGESTROL ACETATE 400 MG/10ML PO SUSP
800.0000 mg | Freq: Every day | ORAL | Status: DC
  Administered 2016-11-19 – 2016-11-21 (×3): 800 mg via ORAL
  Filled 2016-11-19 (×3): qty 20

## 2016-11-19 MED ORDER — VANCOMYCIN HCL IN DEXTROSE 1-5 GM/200ML-% IV SOLN
1000.0000 mg | Freq: Once | INTRAVENOUS | Status: AC
  Administered 2016-11-19: 1000 mg via INTRAVENOUS
  Filled 2016-11-19: qty 200

## 2016-11-19 MED ORDER — LIDOCAINE HCL 2 % EX GEL
1.0000 "application " | Freq: Once | CUTANEOUS | Status: AC
  Administered 2016-11-19: 1 via URETHRAL
  Filled 2016-11-19: qty 5

## 2016-11-19 MED ORDER — SODIUM CHLORIDE 0.9 % IV BOLUS (SEPSIS)
1000.0000 mL | Freq: Once | INTRAVENOUS | Status: AC
  Administered 2016-11-19: 1000 mL via INTRAVENOUS

## 2016-11-19 MED ORDER — ACYCLOVIR 200 MG PO CAPS
200.0000 mg | ORAL_CAPSULE | Freq: Once | ORAL | Status: DC
  Filled 2016-11-19: qty 1

## 2016-11-19 MED ORDER — ONDANSETRON HCL 4 MG/2ML IJ SOLN
4.0000 mg | Freq: Four times a day (QID) | INTRAMUSCULAR | Status: DC | PRN
  Filled 2016-11-19: qty 2

## 2016-11-19 MED ORDER — LIDOCAINE HCL 2 % EX GEL
CUTANEOUS | Status: AC
  Filled 2016-11-19: qty 10

## 2016-11-19 MED ORDER — ACETAMINOPHEN 650 MG RE SUPP: 650.0000 mg | Freq: Four times a day (QID) | RECTAL | Status: DC | PRN

## 2016-11-19 MED ORDER — ONDANSETRON HCL 4 MG PO TABS
4.0000 mg | ORAL_TABLET | Freq: Four times a day (QID) | ORAL | Status: DC | PRN
Start: 2016-11-19 — End: 2016-11-21

## 2016-11-19 MED ORDER — VANCOMYCIN HCL 500 MG IV SOLR
500.0000 mg | Freq: Two times a day (BID) | INTRAVENOUS | Status: DC
  Administered 2016-11-19 – 2016-11-21 (×4): 500 mg via INTRAVENOUS
  Filled 2016-11-19 (×6): qty 500

## 2016-11-19 MED ORDER — BUPROPION HCL ER (XL) 150 MG PO TB24
150.0000 mg | ORAL_TABLET | Freq: Every day | ORAL | Status: DC
  Administered 2016-11-19 – 2016-11-21 (×3): 150 mg via ORAL
  Filled 2016-11-19 (×3): qty 1

## 2016-11-19 MED ORDER — SODIUM CHLORIDE 0.9 % IV SOLN
INTRAVENOUS | Status: DC
  Administered 2016-11-19: 18:00:00 via INTRAVENOUS

## 2016-11-19 MED ORDER — PIPERACILLIN-TAZOBACTAM 3.375 G IVPB 30 MIN
3.3750 g | Freq: Once | INTRAVENOUS | Status: AC
  Administered 2016-11-19: 3.375 g via INTRAVENOUS

## 2016-11-19 MED ORDER — VALACYCLOVIR HCL 500 MG PO TABS
ORAL_TABLET | ORAL | Status: AC
  Filled 2016-11-19: qty 2

## 2016-11-19 MED ORDER — VALACYCLOVIR HCL 500 MG PO TABS
1000.0000 mg | ORAL_TABLET | Freq: Two times a day (BID) | ORAL | Status: DC
  Administered 2016-11-19 – 2016-11-21 (×4): 1000 mg via ORAL
  Filled 2016-11-19 (×4): qty 2

## 2016-11-19 MED ORDER — NICOTINE 21 MG/24HR TD PT24
21.0000 mg | MEDICATED_PATCH | Freq: Every day | TRANSDERMAL | Status: DC
  Administered 2016-11-19: 21 mg via TRANSDERMAL
  Filled 2016-11-19 (×2): qty 1

## 2016-11-19 NOTE — ED Notes (Signed)
Lab notified to add on Procalcitonin

## 2016-11-19 NOTE — H&P (Addendum)
[facility] [place] [facility]   PATIENT NAME: Jonathon LyRichard Rieke    MR#:  [id number]  DATE OF BIRTH:  10/23/1975  DATE OF ADMISSION:  11/19/2016  PRIMARY CARE PHYSICIAN: Dan [place], [facility]   REQUESTING/REFERRING PHYSICIAN: Dr. Don PerkingVeronese MD  CHIEF COMPLAINT:   Chief Complaint  Patient presents with  . Dysuria    HISTORY OF PRESENT ILLNESS: Jonathon York  is a 41 y.o. male with a known history of Paraplegia and history of decubitus ulcer who was hospitalized a few months prior and was sent to a nursing home. Patient reports that after being discharged from the nursing home he did not follow-up for any wound care or was seen by primary care provider. He is coming in with worsening decubitus ulcer. As well as cold chills. He also does in and out catheter on his own himself and unable to do that due to penile lesions. He is slightly tachycardic here as well as has leukocytosis here.  PAST MEDICAL HISTORY:   Past Medical History:  Diagnosis Date  . Decubitus ulcer   . Neurogenic bladder   . Osteomyelitis ([facility])   . Paraparesis of both lower limbs ([facility]) 02/21/00    PAST SURGICAL HISTORY:  Past Surgical History:  Procedure Laterality Date  . AMPUTATION TOE Right 04/29/2015   Procedure: AMPUTATION TOE;  Surgeon: Linus Galasodd Cline, MD;  Location: [facility] ORS;  Service: Podiatry;  Laterality: Right;  . IRRIGATION AND DEBRIDEMENT BUTTOCKS    . TOE AMPUTATION Right     SOCIAL HISTORY:  Social History  Substance Use Topics  . Smoking status: Former Smoker    Packs/day: 0.00    Types: Cigarettes  . Smokeless tobacco: Never Used  . Alcohol use No    FAMILY HISTORY:  Family History  Problem Relation Age of Onset  . Lymphoma Sister     DRUG ALLERGIES:  Allergies  Allergen Reactions  . Orange Fruit [Citrus] Other (See Comments)    blisters    REVIEW OF SYSTEMS:   CONSTITUTIONAL: No fever,Positive fatigue positive weakness.  EYES: No blurred or double vision.   EARS, NOSE, AND THROAT: No tinnitus or ear pain.  RESPIRATORY: No cough, shortness of breath, wheezing or hemoptysis.  CARDIOVASCULAR: No chest pain, orthopnea, edema.  GASTROINTESTINAL: No nausea, vomiting, diarrhea or abdominal pain.  GENITOURINARY: No dysuria, hematuria.  ENDOCRINE: No polyuria, nocturia,  HEMATOLOGY: No anemia, easy bruising or bleeding SKIN: Positive decubitus ulcer MUSCULOSKELETAL: No joint pain or arthritis.   NEUROLOGIC: No tingling, numbness, weakness.  PSYCHIATRY: No anxiety or depression.   MEDICATIONS AT HOME:  Prior to Admission medications   Medication Sig Start Date End Date Taking? Authorizing Provider  oxybutynin (DITROPAN) 5 MG tablet Take 15 mg by mouth 2 (two) times daily.   Yes [provider]  bisacodyl (DULCOLAX) 5 MG EC tablet Take 1 tablet (5 mg total) by mouth daily as needed for moderate constipation. Patient not taking: Reported on 04/15/2016 04/30/15   Katharina CaperVaickute, Rima, MD  buPROPion (WELLBUTRIN XL) 150 MG 24 hr tablet Take 1 tablet (150 mg total) by mouth daily. Patient not taking: Reported on 11/19/2016 04/18/16   Shaune Pollackhen, Qing, MD  diphenhydrAMINE (BENADRYL) 25 mg capsule Take 1 capsule (25 mg total) by mouth every 4 (four) hours as needed for itching or allergies. Patient not taking: Reported on 04/15/2016 04/30/15   Katharina CaperVaickute, Rima, MD  docusate sodium (COLACE) 100 MG capsule Take 1 capsule (100 mg total) by mouth 2 (two) times daily. Patient  not taking: Reported on 04/15/2016 04/30/15   Katharina Caper, MD  megestrol (MEGACE) 400 MG/10ML suspension Take 20 mLs (800 mg total) by mouth daily. Patient not taking: Reported on 11/19/2016 04/18/16   Auburn Bilberry, MD  piperacillin-tazobactam (ZOSYN) 3.375 GM/50ML IVPB Inject 50 mLs (3.375 g total) into the vein every 8 (eight) hours. Patient not taking: Reported on 11/19/2016 04/18/16   Shaune Pollack, MD  promethazine (PHENERGAN) 25 MG tablet Take 1 tablet (25 mg total) by mouth every 6 (six) hours  as needed for nausea or vomiting. Patient not taking: Reported on 04/15/2016 07/16/15   Minna Antis, MD      PHYSICAL EXAMINATION:   VITAL SIGNS: Blood pressure 112/70, pulse (!) 112, temperature 99.5 F (37.5 C), temperature source Oral, resp. rate 18, height 5\' 8"  (1.727 m), weight 95 lb (43.1 kg), SpO2 99 %.  GENERAL:  41 y.o.-year-old patient lying in the bed with no acute distress.  EYES: Pupils equal, round, reactive to light and accommodation. No scleral icterus. Extraocular muscles intact.  HEENT: Head atraumatic, normocephalic. Oropharynx and nasopharynx clear.  NECK:  Supple, no jugular venous distention. No thyroid enlargement, no tenderness.  LUNGS: Normal breath sounds bilaterally, no wheezing, rales,rhonchi or crepitation. No use of accessory muscles of respiration.  CARDIOVASCULAR: S1, S2 normal. No murmurs, rubs, or gallops.  ABDOMEN: Soft, nontender, nondistended. Bowel sounds present. No organomegaly or mass.  EXTREMITIES: No pedal edema, cyanosis, or clubbing.  NEUROLOGIC: Cranial nerves II through XII are intact. Muscle strength 5/5 in all extremities. Sensation intact. Gait not checked.  PSYCHIATRIC: The patient is alert and oriented x 3.  SKIN: Large decubitus ulcer stage III with surrounding erythema GU he has lesions noted in his penis.   LABORATORY PANEL:   CBC  Recent Labs Lab 11/19/16 1306  WBC 13.6*  HGB 10.2*  HCT 33.7*  PLT 435  MCV 63.4*  MCH 19.3*  MCHC 30.4*  RDW 18.4*  LYMPHSABS 1.0  MONOABS 0.9  EOSABS 0.0  BASOSABS 0.1   ------------------------------------------------------------------------------------------------------------------  Chemistries   Recent Labs Lab 11/19/16 1306  NA 133*  K 3.6  CL 101  CO2 17*  GLUCOSE 101*  BUN 6  CREATININE 0.70  CALCIUM 8.8*  AST 11*  ALT 8*  ALKPHOS 89  BILITOT 1.0    ------------------------------------------------------------------------------------------------------------------ estimated creatinine clearance is 74.1 mL/min (by C-G formula based on SCr of 0.7 mg/dL). ------------------------------------------------------------------------------------------------------------------ No results for input(s): TSH, T4TOTAL, T3FREE, THYROIDAB in the last 72 hours.  Invalid input(s): FREET3   Coagulation profile No results for input(s): INR, PROTIME in the last 168 hours. ------------------------------------------------------------------------------------------------------------------- No results for input(s): DDIMER in the last 72 hours. -------------------------------------------------------------------------------------------------------------------  Cardiac Enzymes No results for input(s): CKMB, TROPONINI, MYOGLOBIN in the last 168 hours.  Invalid input(s): CK ------------------------------------------------------------------------------------------------------------------ Invalid input(s): POCBNP  ---------------------------------------------------------------------------------------------------------------  Urinalysis    Component Value Date/Time   COLORURINE YELLOW (A) 11/19/2016 1418   APPEARANCEUR CLEAR (A) 11/19/2016 1418   APPEARANCEUR Turbid 12/24/2013 0313   LABSPEC 1.019 11/19/2016 1418   LABSPEC 1.021 12/24/2013 0313   PHURINE 5.0 11/19/2016 1418   GLUCOSEU NEGATIVE 11/19/2016 1418   GLUCOSEU Negative 12/24/2013 0313   HGBUR SMALL (A) 11/19/2016 1418   BILIRUBINUR NEGATIVE 11/19/2016 1418   BILIRUBINUR Negative 12/24/2013 0313   KETONESUR 80 (A) 11/19/2016 1418   PROTEINUR 30 (A) 11/19/2016 1418   NITRITE NEGATIVE 11/19/2016 1418   LEUKOCYTESUR TRACE (A) 11/19/2016 1418   LEUKOCYTESUR 1+ 12/24/2013 0313     RADIOLOGY: Dg Chest 2 View  Result Date: 11/19/2016 CLINICAL DATA:  41 year old with chronic bilateral lower  extremity paraplegia, now unable to self catheterize due to pressure sores on the penis. Tachycardia upon arrival to the emergency department. EXAM: CHEST  2 VIEW COMPARISON:  04/15/2016, 12/24/2013 and earlier. FINDINGS: Cardiomediastinal silhouette unremarkable, unchanged. Stable hyperinflation. Lungs clear. Bronchovascular markings normal. Pulmonary vascularity normal. No visible pleural effusions. No pneumothorax. Harrington rods from T7 through L1. IMPRESSION: Stable hyperinflation indicating COPD and/or asthma. No acute cardiopulmonary disease. Electronically Signed   By: Hulan Saas M.D.   On: 11/19/2016 14:49    EKG: Orders placed or performed during the hospital encounter of 11/19/16  . EKG 12-Lead  . EKG 12-Lead  . ED EKG 12-Lead  . ED EKG 12-Lead    IMPRESSION AND PLAN: Patient is 41 year old with paraplegia with decubitus ulcer  1. Sepsis due to infected decubitus ulcer I will ask surgery to see Wound care consult Treat with broad-spectrum antibiotics with IV vancomycin and Zosyn  2. 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 Bladder dysfunction I will restart his Ditropan which she has not been taking  4. Failure to thrive I will restart his Wellbutrin and Megace which she is also not taking  5. Misc: lovenox for dvt proph  6. Nicotine abuse : Smoking cessation provided I recommended he stop smoking patient will be started on nicotine patch four minutes spent  All the records are reviewed and case discussed with ED provider. Management plans discussed with the patient, family and they are in agreement.  CODE STATUS: Code Status History    Date Active Date Inactive Code Status Order ID Comments User Context   04/15/2016  6:02 PM 04/19/2016  2:11 AM Full Code [id number]  Shaune Pollack, MD Inpatient   04/29/2015 10:17 AM 04/30/2015  1:39 PM Full Code [id number]  Linus Galas, MD Inpatient   04/28/2015  4:38 AM 04/29/2015 10:17 AM Full Code [id number]  Milagros Loll, MD ED       TOTAL TIME TAKING CARE OF THIS PATIENT:55 minutes.    Auburn Bilberry M.D on 11/19/2016 at 3:29 PM  Between 7am to 6pm - Pager - [phone]  After 6pm go to [email or web address] - password [email or web address] [facility]  [place] [facility]  Office  [phone]  CC: Primary care physician; [facility]

## 2016-11-19 NOTE — ED Notes (Signed)
Informed Secretary to call in code sepsis as it had not been done already.

## 2016-11-19 NOTE — ED Notes (Signed)
Warm blankets given.

## 2016-11-19 NOTE — ED Notes (Signed)
Dr. Don PerkingVeronese aware fluids complete, Dr. Allena KatzPatel in room to see fluids were finished.

## 2016-11-19 NOTE — ED Provider Notes (Signed)
[facility] Emergency Department Provider Note  ____________________________________________  Time seen: Approximately 2:56 PM  I have reviewed the triage vital signs and the nursing notes.   HISTORY  Chief Complaint Dysuria   HPI Jonathon York is a 41 y.o. male with h/o paraplegia and self cath who presents for evaluation of dysuria and inability to self cath. Patient reports penile lesions for three days which has made him unable to catheterize himself. He reports that he has been having overflow incontinence for the last 3 days with dysuria. Patient also has severe decubitus ulcers that have not been followed by wound care. He lives alone with no home health care. He reports malaise, chills. His symptoms have been constant and moderate in intensity for the last 3 days. He denies fever, cough or congestion, diarrhea, abdominal pain however patient has no sensation from his waist down.  Past Medical History:  Diagnosis Date  . 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 Decubitus ulcer of sacral region   . Sepsis (HCC) 04/15/2016  . 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 Pressure ulcer stage III 04/30/2015  . Sterile pyuria 04/30/2015  . Paraplegia (HCC) 04/30/2015  . Neurogenic bladder 04/30/2015  . 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 Malnutrition of moderate degree 04/28/2015    Past Surgical History:  Procedure Laterality Date  . 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 IRRIGATION AND DEBRIDEMENT BUTTOCKS    . TOE AMPUTATION Right     Prior to Admission medications   Medication Sig Start Date End Date Taking? Authorizing Provider  oxybutynin (DITROPAN) 5 MG  tablet Take 15 mg by mouth 2 (two) times daily.   Yes [provider]  bisacodyl (DULCOLAX) 5 MG EC tablet Take 1 tablet (5 mg total) by mouth daily as needed for moderate constipation. Patient not taking: Reported on 04/15/2016 04/30/15   Katharina Caper, MD  buPROPion (WELLBUTRIN XL) 150 MG 24 hr tablet Take 1 tablet (150 mg total) by mouth daily. Patient not taking: Reported on 11/19/2016 04/18/16   Shaune Pollack, MD  diphenhydrAMINE (BENADRYL) 25 mg capsule Take 1 capsule (25 mg total) by mouth every 4 (four) hours as needed for itching or allergies. Patient not taking: Reported on 04/15/2016 04/30/15   Katharina Caper, MD  docusate sodium (COLACE) 100 MG capsule Take 1 capsule (100 mg total) by mouth 2 (two) times daily. Patient not taking: Reported on 04/15/2016 04/30/15   Katharina Caper, MD  megestrol (MEGACE) 400 MG/10ML suspension Take 20 mLs (800 mg total) by mouth daily. Patient not taking: Reported on 11/19/2016 04/18/16   Auburn Bilberry, MD  piperacillin-tazobactam (ZOSYN) 3.375 GM/50ML IVPB Inject 50 mLs (3.375 g total) into the vein every 8 (eight) hours. Patient not taking: Reported on 11/19/2016 04/18/16   Shaune Pollack, MD  promethazine (PHENERGAN) 25 MG tablet Take 1 tablet (25 mg total) by mouth every 6 (six) hours as needed for nausea or vomiting. Patient not taking: Reported on 04/15/2016 07/16/15   Minna Antis, MD    Allergies Orange fruit [citrus]  Family History  Problem Relation Age of Onset  . Lymphoma Sister     Social History Social History  Substance Use Topics  . Smoking  status: Former Smoker    Packs/day: 0.00    Types: Cigarettes  . Smokeless tobacco: Never Used  . Alcohol use No    Review of Systems  Constitutional: Negative for fever. + Chills and malaise Eyes: Negative for visual changes. ENT: Negative for sore throat. Neck: No neck pain  Cardiovascular: Negative for chest pain. Respiratory: Negative for shortness of  breath. Gastrointestinal: Negative for abdominal pain, vomiting or diarrhea. Genitourinary: + dysuria and penile lesions Musculoskeletal: Negative for back pain. Skin: Negative for rash. + Decubitus ulcer  Neurological: Negative for headaches, weakness or numbness. Psych: No SI or HI  ____________________________________________   PHYSICAL EXAM:  VITAL SIGNS: ED Triage Vitals  Enc Vitals Group     BP 11/19/16 1228 128/85     Pulse Rate 11/19/16 1228 (!) 121     Resp 11/19/16 1228 (!) 25     Temp 11/19/16 1228 99.3 F (37.4 C)     Temp Source 11/19/16 1228 Oral     SpO2 11/19/16 1228 100 %     Weight 11/19/16 1225 95 lb (43.1 kg)     Height 11/19/16 1225 5\' 8"  (1.727 m)     Head Circumference --      Peak Flow --      Pain Score 11/19/16 1224 10     Pain Loc --      Pain Edu? --      Excl. in GC? --     Constitutional: Alert and oriented. Well appearing and in no apparent distress. HEENT:      Head: Normocephalic and atraumatic.         Eyes: Conjunctivae are normal. Sclera is non-icteric.       Mouth/Throat: Mucous membranes are moist.       Neck: Supple with no signs of meningismus. Cardiovascular: Tachycardic with regular rhythm. No murmurs, gallops, or rubs. 2+ symmetrical distal pulses are present in all extremities. No JVD. Respiratory: Normal respiratory effort. Lungs are clear to auscultation bilaterally. No wheezes, crackles, or rhonchi.  Gastrointestinal: Soft, non tender, and non distended with positive bowel sounds. No rebound or guarding. Genitourinary: Patient with penile lesions consistent with herpes infection Musculoskeletal: Nontender with normal range of motion in all extremities. No edema, cyanosis, or erythema of extremities. Neurologic: Normal speech and language. Face is symmetric. Moving all extremities. No gross focal neurologic deficits are appreciated. Skin: Large decubitus ulcer stage III with surrounding cellulitis Psychiatric: Mood and affect  are normal. Speech and behavior are normal.  ____________________________________________   LABS (all labs ordered are listed, but only abnormal results are displayed)  Labs Reviewed  COMPREHENSIVE METABOLIC PANEL - Abnormal; Notable for the following:       Result Value   Sodium 133 (*)    CO2 17 (*)    Glucose, Bld 101 (*)    Calcium 8.8 (*)    Albumin 2.9 (*)    AST 11 (*)    ALT 8 (*)    All other components within normal limits  CBC WITH DIFFERENTIAL/PLATELET - Abnormal; Notable for the following:    WBC 13.6 (*)    Hemoglobin 10.2 (*)    HCT 33.7 (*)    MCV 63.4 (*)    MCH 19.3 (*)    MCHC 30.4 (*)    RDW 18.4 (*)    Neutro Abs 11.5 (*)    All other components within normal limits  URINALYSIS, COMPLETE (UACMP) WITH MICROSCOPIC - Abnormal; Notable for the following:  Color, Urine YELLOW (*)    APPearance CLEAR (*)    Hgb urine dipstick SMALL (*)    Ketones, ur 80 (*)    Protein, ur 30 (*)    Leukocytes, UA TRACE (*)    Squamous Epithelial / LPF 0-5 (*)    All other components within normal limits  CULTURE, BLOOD (ROUTINE X 2)  CULTURE, BLOOD (ROUTINE X 2)  URINE CULTURE  LACTIC ACID, PLASMA  LACTIC ACID, PLASMA   ____________________________________________  EKG  ED ECG REPORT I, Nita Sicklearolina Percy Comp, the attending physician, personally viewed and interpreted this ECG.  Sinus tachycardia, rate of 115, normal intervals, normal axis, no ST elevation, ST depressions on the inferior leads. Unchanged from prior. ____________________________________________  RADIOLOGY  CXR: Negative ____________________________________________   PROCEDURES  Procedure(s) performed: None Procedures Critical Care performed: yes  CRITICAL CARE Performed by: Nita Sicklearolina Beauden Tremont  ?  Total critical care time: 40 min  Critical care time was exclusive of separately billable procedures and treating other patients.  Critical care was necessary to treat or prevent imminent or  life-threatening deterioration.  Critical care was time spent personally by me on the following activities: development of treatment plan with patient and/or surrogate as well as nursing, discussions with consultants, evaluation of patient's response to treatment, examination of patient, obtaining history from patient or surrogate, ordering and performing treatments and interventions, ordering and review of laboratory studies, ordering and review of radiographic studies, pulse oximetry and re-evaluation of patient's condition.  ____________________________________________   INITIAL IMPRESSION / ASSESSMENT AND PLAN / ED COURSE  41 y.o. male with h/o paraplegia and self cath who presents for evaluation of dysuria and inability to self cath due to penile lesions. Patient meets sepsis criteria with low-grade fever, tachycardia, leukocytosis, and cellulitis surrounding his decubitus ulcers. Patient with stage III very large decubitus ulcers in his buttock and lower back. Patient also with evidence of herpetic lesions on his penis. Patient given zosyn/ vanc/ acyclovir. IVF with improvement of vitals. Foley catheter placed. UA negative for UTI, culture pending. Patient will be admitted to the hospitalist service for sepsis.     Pertinent labs & imaging results that were available during my care of the patient were reviewed by me and considered in my medical decision making (see chart for details).    ____________________________________________   FINAL CLINICAL IMPRESSION(S) / ED DIAGNOSES  Final diagnoses:  Decubitus ulcer of buttock, stage 3, unspecified laterality (HCC)  Sepsis, due to unspecified organism (HCC)  Cellulitis of buttock  Herpes simplex infection of penis      NEW MEDICATIONS STARTED DURING THIS VISIT:  New Prescriptions   No medications on file     Note:  This document was prepared using Dragon voice recognition software and may include unintentional dictation  errors.    Don PerkingVeronese, [place], MD 11/19/16 [phone]

## 2016-11-19 NOTE — ED Notes (Signed)
Informed by RN of Code Sepsis order put in at 1307, no previous notification  Called carelink [phone]

## 2016-11-19 NOTE — Consult Note (Addendum)
SURGICAL CONSULTATION NOTE (initial) - cpt: 99254, 11044  HISTORY OF PRESENT ILLNESS (HPI):  41 y.o. 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 Patient also states his sacral wounds have been smelling foul over the past several weeks since he was discharged from a skilled nursing facility to his home where he lives and cares for himself alone. He describes he is usually able to use his upper body strength to reposition himself to reduce extended pressure to any particular area, but he has lost 30 lbs over the past 3 - 4 months with diminished energy accordingly and says he can't feel, see, or reach his sacrum.   Patient says he was previously prescribed Megace to stimulate his appetite and advised to seek reconstructive surgical consult, but neither occurred while or since he was admitted to SNF between 03/2016 and 05/2016. Patient's sister expresses concern that he can not continue caring for himself alone in the house he owns as a result of his workman's compensation settlement. Patient otherwise denies any fever/chills, CP, or SOB and has no feeling below his waist and genitals.  Surgery is consulted by medical physician Dr. Allena KatzPatel in this context for evaluation and management of sacral pressure wounds.  PAST MEDICAL HISTORY (PMH):  Past Medical History:  Diagnosis Date  . Anxiety   . Decubitus ulcer   . Depression   . Neurogenic bladder   . Osteomyelitis (HCC)   . Paraparesis of both lower limbs (HCC) 02/21/00     PAST SURGICAL HISTORY (PSH):  Past Surgical History:  Procedure Laterality Date  . AMPUTATION TOE Right 04/29/2015   Procedure: AMPUTATION TOE;  Surgeon: Linus Galasodd Cline, MD;  Location: [facility] ORS;  Service:  Podiatry;  Laterality: Right;  . IRRIGATION AND DEBRIDEMENT BUTTOCKS    . TOE AMPUTATION Right      MEDICATIONS:  Prior to Admission medications   Medication Sig Start Date End Date Taking? Authorizing Provider  oxybutynin (DITROPAN) 5 MG tablet Take 15 mg by mouth 2 (two) times daily.   Yes [provider]  bisacodyl (DULCOLAX) 5 MG EC tablet Take 1 tablet (5 mg total) by mouth daily as needed for moderate constipation. Patient not taking: Reported on 04/15/2016 04/30/15   Katharina CaperVaickute, Rima, MD  buPROPion (WELLBUTRIN XL) 150 MG 24 hr tablet Take 1 tablet (150 mg total) by mouth daily. Patient not taking: Reported on 11/19/2016 04/18/16   Shaune Pollackhen, Qing, MD  diphenhydrAMINE (BENADRYL) 25 mg capsule Take 1 capsule (25 mg total) by mouth every 4 (four) hours as needed for itching or allergies. Patient not taking: Reported on 04/15/2016 04/30/15   Katharina CaperVaickute, Rima, MD  docusate sodium (COLACE) 100 MG capsule Take 1 capsule (100 mg total) by mouth 2 (two) times daily. Patient not taking: Reported on 04/15/2016 04/30/15   Katharina CaperVaickute, Rima, MD  megestrol (MEGACE) 400 MG/10ML suspension Take 20 mLs (800 mg total) by mouth daily. Patient not taking: Reported on 11/19/2016 04/18/16   Auburn BilberryPatel, Shreyang, MD  piperacillin-tazobactam (ZOSYN) 3.375 GM/50ML IVPB Inject 50 mLs (3.375 g total) into the vein every 8 (eight) hours. Patient not taking: Reported on 11/19/2016 04/18/16   Shaune Pollackhen, Qing, MD  promethazine (PHENERGAN) 25 MG tablet Take 1 tablet (25 mg total) by mouth every 6 (six) hours  as needed for nausea or vomiting. Patient not taking: Reported on 04/15/2016 07/16/15   Minna Antis, MD     ALLERGIES:  Allergies  Allergen Reactions  . Orange Fruit [Citrus] Other (See Comments)    blisters     SOCIAL HISTORY:  Social History   Social History  . Marital status: Single    Spouse name: N/A  . Number of children: N/A  . Years of education: N/A   Occupational History  . Not on file.   Social  History Main Topics  . Smoking status: Current Some Day Smoker    Packs/day: 0.50    Types: Cigarettes  . Smokeless tobacco: Never Used  . Alcohol use No  . Drug use: Yes    Types: Marijuana  . Sexual activity: No   Other Topics Concern  . Not on file   Social History Narrative  . No narrative on file    The patient currently resides (home / rehab facility / nursing home): Home alone until recently SNF The patient normally is (ambulatory / bedbound): Non-ambulatory secondary to paraplegia  FAMILY HISTORY:  Family History  Problem Relation Age of Onset  . Lymphoma Sister     REVIEW OF SYSTEMS:  Constitutional: denies weight loss, fever, chills, or sweats  Eyes: denies any other vision changes, history of eye injury  ENT: denies sore throat, hearing problems  Respiratory: denies shortness of breath, wheezing  Cardiovascular: denies chest pain, palpitations  Gastrointestinal: denies abdominal pain, N/V, or diarrhea Genitourinary: denies burning with urination or urinary frequency Musculoskeletal: denies any other joint pains or cramps  Skin: sacral wounds as per HPI  Neurological: denies any other headache, dizziness, weakness  Psychiatric: denies any other depression, anxiety   All other review of systems were negative   VITAL SIGNS:  Temp:  [99.1 F (37.3 C)-99.5 F (37.5 C)] 99.1 F (37.3 C) (07/24 1708) Pulse Rate:  [97-133] 97 (07/24 1708) Resp:  [13-25] 17 (07/24 1708) BP: (99-128)/(55-85) 99/55 (07/24 1708) SpO2:  [98 %-100 %] 100 % (07/24 1708) Weight:  [95 lb (43.1 kg)-99 lb (44.9 kg)] 99 lb (44.9 kg) (07/24 1654)     Height: 5\' 7"  (170.2 cm) Weight: 99 lb (44.9 kg) BMI (Calculated): 15.5   INTAKE/OUTPUT:  This shift: No intake/output data recorded.  Last 2 shifts: @IOLAST2SHIFTS @   PHYSICAL EXAM:  Constitutional:  -- Cachectic body habitus  -- Awake, alert, and oriented x3  Eyes:  -- Pupils equally round and reactive to light  -- No scleral icterus   Ear, nose, and throat:  -- No jugular venous distension  Pulmonary:  -- No crackles  -- Equal breath sounds bilaterally -- Breathing non-labored at rest Cardiovascular:  -- S1, S2 present  -- No pericardial rubs Gastrointestinal:  -- Abdomen soft, nontender, nondistended, no guarding/rebound  -- No abdominal masses appreciated, pulsatile or otherwise  Musculoskeletal and Integumentary:  -- Wounds or skin discoloration: largest ~12 cm x 12 cm superior stage 3 pressure wound to muscle appears reasonably healthy without fibrinous exudate or surrounding erythema, as does the smaller 7 cm x 8 cm stage 3 Left buttock pressure wound to muscle, but a 6 cm x 7 cm new Right buttock wound that appears to have developed over the past several weeks - months includes a 3 cm x 3 cm x 3 cm deep area of foul-smelling necrotic muscle and soft tissues down to periosteum once necrotic muscle and soft tissue were debrided -- Extremities: B/L UE FROM, B/L LE  atrophy, hands and feet warm, no edema  Neurologic:  -- Motor function: paraplegia -- Sensation: impaired below patient's waist and genitals  Labs:  CBC Latest Ref Rng & Units 11/19/2016 04/18/2016 04/17/2016  WBC 3.8 - 10.6 K/uL 13.6(H) 7.1 7.2  Hemoglobin 13.0 - 18.0 g/dL 10.2(L) 8.6(L) 7.1(L)  Hematocrit 40.0 - 52.0 % 33.7(L) 27.3(L) 23.2(L)  Platelets 150 - 440 K/uL 435 498(H) 496(H)   CMP Latest Ref Rng & Units 11/19/2016 04/18/2016 04/17/2016  Glucose 65 - 99 mg/dL 409(W) 93 88  BUN 6 - 20 mg/dL 6 8 <1(X)  Creatinine 9.14 - 1.24 mg/dL 7.82 9.56(O) 1.30(Q)  Sodium 135 - 145 mmol/L 133(L) 142 140  Potassium 3.5 - 5.1 mmol/L 3.6 3.8 3.8  Chloride 101 - 111 mmol/L 101 112(H) 109  CO2 22 - 32 mmol/L 17(L) 25 25  Calcium 8.9 - 10.3 mg/dL 6.5(H) 8.4(O) 8.1(L)  Total Protein 6.5 - 8.1 g/dL 8.0 - -  Total Bilirubin 0.3 - 1.2 mg/dL 1.0 - -  Alkaline Phos 38 - 126 U/L 89 - -  AST 15 - 41 U/L 11(L) - -  ALT 17 - 63 U/L 8(L) - -   Imaging studies:   Chest x-ray (11/19/2016) Stable hyperinflation indicating COPD and/or asthma. No acute cardiopulmonary disease.  Assessment/Plan: (ICD-10's: L89.314) 41 y.o. 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   - pressure offloading (mattress, etc) and frequent repositioning absolutely imperative  - will need underlying problems of prolonged pressure and poor nutrition addressed prior to consideration for reconstructive  - 3 cm x 3 cm x 3 cm central necrosis of muscle and soft tissue debrided to periosteum with no healthy tissue overlying bone   - follow-up case manager regarding long-term plans for assisted living with wound care and pressure offloading capabilities  - appears to be a problem due to pressure rather than infection, so unlikely to benefit from diverting colostomy  - continue antibiotics and medical management of comorbidities as per primary team  - likely needs to reconsider current living arrangements, f/u case manager  - silica membrane with foam dressings applied, change daily  - nutritional supplementation and likely resume Megace  - DVT prophylaxis  All of the above findings and recommendations were discussed extensively with the patient and his sister (bedside), and all of patient's and his family's questions were answered to their expressed satisfaction.  Thank you for the opportunity to participate in this patient's care.   -- Scherrie Gerlach Earlene Plater, MD, RPVI [place]: [facility] General Surgery - Partnering for exceptional care. Office: [phone]

## 2016-11-19 NOTE — Progress Notes (Signed)
Pharmacy Antibiotic Note  Jonathon LeedsRichard L York is a 41 y.o. male admitted on 11/19/2016 with wound infection.  Pharmacy has been consulted for vancomycin and piperacillin/tazobactam dosing.  Patient is paraplegic, making it more difficult to assess renal function using SCr.  Plan: Piperacillin/tazobactam 3.375 g IV q8h EI  Vancomycin 1000 mg dose given in ED Will order vancomycin 500 mg IV q12h Goal VT 15-20 mcg/mL VT ordered 7/26 @ 1030 which is prior to 5th dose   Kinetics: Using actual body weight = 43 kg, CrCl 74 mL/min Ke: 0.066 Half-life: 10.5 hrs Vd: 30 L Cmin (estimated): 15 mcg/mL  Height: 5\' 8"  (172.7 cm) Weight: 95 lb (43.1 kg) IBW/kg (Calculated) : 68.4  Temp (24hrs), Avg:99.4 F (37.4 C), Min:99.3 F (37.4 C), Max:99.5 F (37.5 C)   Recent Labs Lab 11/19/16 1306  WBC 13.6*  CREATININE 0.70  LATICACIDVEN 1.0    Estimated Creatinine Clearance: 74.1 mL/min (by C-G formula based on SCr of 0.7 mg/dL).    Allergies  Allergen Reactions  . Orange Fruit [Citrus] Other (See Comments)    blisters    Antimicrobials this admission: vancomycin 7/24 >>  Piperacillin/tazobactam 7/24 >>   Dose adjustments this admission:  Microbiology results: 7/24 BCx: Sent 7/24 UCx: Sent   Thank you for allowing pharmacy to be a part of this patient's care.  Cindi CarbonMary M Pamela Maddy, PharmD, BCPS Clinical Pharmacist 11/19/2016 4:03 PM

## 2016-11-19 NOTE — ED Triage Notes (Signed)
Pt presents to ED from home for not being able to self cath x 3 days. States he is paralyzed from waist down x 17 years and has always done in and out. In march he had to have a foley which got about half way in and then pt started bleeding. States sores on bottom and penis. States current sore on penis is swollen to the point where he can't advance a catheter. Alert, oriented. EMS reports HR 124 but all other VS WNL.

## 2016-11-20 ENCOUNTER — Inpatient Hospital Stay: Payer: Medicare Other

## 2016-11-20 DIAGNOSIS — A6001 Herpesviral infection of penis: Secondary | ICD-10-CM

## 2016-11-20 DIAGNOSIS — E43 Unspecified severe protein-calorie malnutrition: Secondary | ICD-10-CM | POA: Insufficient documentation

## 2016-11-20 LAB — BASIC METABOLIC PANEL
ANION GAP: 9 (ref 5–15)
CO2: 19 mmol/L — AB (ref 22–32)
Calcium: 8 mg/dL — ABNORMAL LOW (ref 8.9–10.3)
Chloride: 109 mmol/L (ref 101–111)
Creatinine, Ser: 0.34 mg/dL — ABNORMAL LOW (ref 0.61–1.24)
GFR calc Af Amer: >60 mL/min (ref >60)
GFR calc non Af Amer: >60 mL/min (ref >60)
GLUCOSE: 113 mg/dL — AB (ref 65–99)
POTASSIUM: 2.8 mmol/L — AB (ref 3.5–5.1)
Sodium: 137 mmol/L (ref 135–145)

## 2016-11-20 LAB — CBC
HEMATOCRIT: 27.5 % — AB (ref 40.0–52.0)
Hemoglobin: 8.5 g/dL — ABNORMAL LOW (ref 13.0–18.0)
MCH: 20 pg — AB (ref 26.0–34.0)
MCHC: 31 g/dL — ABNORMAL LOW (ref 32.0–36.0)
MCV: 64.5 fL — AB (ref 80.0–100.0)
PLATELETS: 336 10*3/uL (ref 150–440)
RBC: 4.27 MIL/uL — AB (ref 4.40–5.90)
RDW: 18.6 % — ABNORMAL HIGH (ref 11.5–14.5)
WBC: 10.9 10*3/uL — AB (ref 3.8–10.6)

## 2016-11-20 LAB — URINE CULTURE

## 2016-11-20 LAB — MAGNESIUM: Magnesium: 1.5 mg/dL — ABNORMAL LOW (ref 1.7–2.4)

## 2016-11-20 LAB — MRSA PCR SCREENING: MRSA by PCR: POSITIVE — AB

## 2016-11-20 MED ORDER — ENOXAPARIN SODIUM 30 MG/0.3ML ~~LOC~~ SOLN
30.0000 mg | SUBCUTANEOUS | Status: DC
  Administered 2016-11-20: 30 mg via SUBCUTANEOUS
  Filled 2016-11-20: qty 0.3

## 2016-11-20 MED ORDER — CHLORHEXIDINE GLUCONATE CLOTH 2 % EX PADS
6.0000 | MEDICATED_PAD | Freq: Every day | CUTANEOUS | Status: DC
  Administered 2016-11-20 – 2016-11-21 (×2): 6 via TOPICAL

## 2016-11-20 MED ORDER — PRO-STAT SUGAR FREE PO LIQD
30.0000 mL | Freq: Two times a day (BID) | ORAL | Status: DC
  Administered 2016-11-20 – 2016-11-21 (×3): 30 mL via ORAL

## 2016-11-20 MED ORDER — PROMETHAZINE HCL 25 MG/ML IJ SOLN
12.5000 mg | INTRAMUSCULAR | Status: DC | PRN
  Administered 2016-11-20: 12.5 mg via INTRAVENOUS
  Filled 2016-11-20: qty 1

## 2016-11-20 MED ORDER — MUPIROCIN 2 % EX OINT
1.0000 "application " | TOPICAL_OINTMENT | Freq: Two times a day (BID) | CUTANEOUS | Status: DC
  Administered 2016-11-20 – 2016-11-21 (×3): 1 via NASAL
  Filled 2016-11-20: qty 22

## 2016-11-20 MED ORDER — POTASSIUM CHLORIDE IN NACL 40-0.9 MEQ/L-% IV SOLN
INTRAVENOUS | Status: AC
Start: 2016-11-20 — End: 2016-11-20
  Administered 2016-11-20: 75 mL/h via INTRAVENOUS
  Filled 2016-11-20: qty 1000

## 2016-11-20 MED ORDER — ADULT MULTIVITAMIN W/MINERALS CH
1.0000 | ORAL_TABLET | Freq: Every day | ORAL | Status: DC
  Administered 2016-11-20 – 2016-11-21 (×2): 1 via ORAL
  Filled 2016-11-20 (×2): qty 1

## 2016-11-20 MED ORDER — ENSURE ENLIVE PO LIQD
237.0000 mL | Freq: Two times a day (BID) | ORAL | Status: DC
  Administered 2016-11-20 – 2016-11-21 (×4): 237 mL via ORAL

## 2016-11-20 MED ORDER — NICOTINE 7 MG/24HR TD PT24
7.0000 mg | MEDICATED_PATCH | Freq: Every day | TRANSDERMAL | Status: DC
  Administered 2016-11-20 – 2016-11-21 (×2): 7 mg via TRANSDERMAL
  Filled 2016-11-20 (×2): qty 1

## 2016-11-20 MED ORDER — MAGNESIUM SULFATE 2 GM/50ML IV SOLN
2.0000 g | Freq: Once | INTRAVENOUS | Status: AC
  Administered 2016-11-20: 2 g via INTRAVENOUS
  Filled 2016-11-20: qty 50

## 2016-11-20 NOTE — Progress Notes (Signed)
Called Dr. Tobi BastosPyreddy regarding better medication for nausea per patient request.  Appropriate orders were placed.  Arturo MortonClay, Adali Pennings N  11/20/2016 3:50 AM
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
## 2016-11-20 NOTE — Progress Notes (Signed)
Lovenox changed to 30 mg daily for TBW <45kg. 

## 2016-11-20 NOTE — Progress Notes (Signed)
[employer] [facility] [place] [facility]   PATIENT NAME: Jonathon York    MR#:  [id number]  DATE OF BIRTH:  08/20/1975  SUBJECTIVE:  CHIEF COMPLAINT:   Chief Complaint  Patient presents with  . Dysuria   He have b/l lower extremities paralysis secondary to a fall 17 yrs ago, had decubetus ulcer infection in Dec 2017- was treated with IV abx and set to LTAC- for few months- loosing weight and is weak, came with worsening of his ulcer. Also have a lesion on his penis and it is painful to do in and out catheter. Surgical consult is done and had debridement last night.  REVIEW OF SYSTEMS:  CONSTITUTIONAL: No fever, fatigue or weakness.  EYES: No blurred or double vision.  EARS, NOSE, AND THROAT: No tinnitus or ear pain.  RESPIRATORY: No cough, shortness of breath, wheezing or hemoptysis.  CARDIOVASCULAR: No chest pain, orthopnea, edema.  GASTROINTESTINAL: No nausea, vomiting, diarrhea or abdominal pain.  GENITOURINARY: No dysuria, hematuria.  ENDOCRINE: No polyuria, nocturia,  HEMATOLOGY: No anemia, easy bruising or bleeding SKIN: No rash or lesion. MUSCULOSKELETAL: No joint pain or arthritis.   NEUROLOGIC: No tingling, numbness, weakness.  PSYCHIATRY: No anxiety or depression.   ROS  DRUG ALLERGIES:   Allergies  Allergen Reactions  . Orange Fruit [Citrus] Other (See Comments)    blisters    VITALS:  Blood pressure (!) 132/53, pulse (!) 108, temperature 98.9 F (37.2 C), temperature source Oral, resp. rate 14, height 5\' 7"  (1.702 m), weight 48 kg (105 lb 14.4 oz), SpO2 99 %.  PHYSICAL EXAMINATION:   GENERAL:  41 y.o.-year-old patient lying in the bed with no acute distress.  EYES: Pupils equal, round, reactive to light and accommodation. No scleral icterus. Extraocular muscles intact.  HEENT: Head atraumatic, normocephalic. Oropharynx and nasopharynx clear.  NECK:  Supple, no jugular venous distention. No thyroid enlargement, no tenderness.  LUNGS: Normal  breath sounds bilaterally, no wheezing, rales,rhonchi or crepitation. No use of accessory muscles of respiration.  CARDIOVASCULAR: S1, S2 normal. No murmurs, rubs, or gallops.  ABDOMEN: Soft, nontender, nondistended. Bowel sounds present. No organomegaly or mass.  EXTREMITIES: No pedal edema, cyanosis, or clubbing.  NEUROLOGIC: Cranial nerves II through XII are intact. Muscle strength 5/5 in upper extremities, but 0/5 in lower extremities. Sensation intact. Gait not checked. Muscle wasting on lower limbs. PSYCHIATRIC: The patient is alert and oriented x 3.  SKIN: Large decubitus ulcer on sacrum with dressing. GU he has lesions noted in his penis.   Physical Exam LABORATORY PANEL:   CBC  Recent Labs Lab 11/20/16 0320  WBC 10.9*  HGB 8.5*  HCT 27.5*  PLT 336   ------------------------------------------------------------------------------------------------------------------  Chemistries   Recent Labs Lab 11/19/16 1306 11/20/16 0320  NA 133* 137  K 3.6 2.8*  CL 101 109  CO2 17* 19*  GLUCOSE 101* 113*  BUN 6 <5*  CREATININE 0.70 0.34*  CALCIUM 8.8* 8.0*  MG  --  1.5*  AST 11*  --   ALT 8*  --   ALKPHOS 89  --   BILITOT 1.0  --    ------------------------------------------------------------------------------------------------------------------  Cardiac Enzymes No results for input(s): TROPONINI in the last 168 hours. ------------------------------------------------------------------------------------------------------------------  RADIOLOGY:  Dg Chest 2 View  Result Date: 11/19/2016 CLINICAL DATA:  41 year old with chronic bilateral lower extremity paraplegia, now unable to self catheterize due to pressure sores on the penis. Tachycardia upon arrival to the emergency department. EXAM: CHEST  2 VIEW COMPARISON:  04/15/2016,  12/24/2013 and earlier. FINDINGS: Cardiomediastinal silhouette unremarkable, unchanged. Stable hyperinflation. Lungs clear. Bronchovascular markings  normal. Pulmonary vascularity normal. No visible pleural effusions. No pneumothorax. Harrington rods from T7 through L1. IMPRESSION: Stable hyperinflation indicating COPD and/or asthma. No acute cardiopulmonary disease. Electronically Signed   By: Hulan Saashomas  Lawrence M.D.   On: 11/19/2016 14:49   Dg Sacrum/coccyx  Result Date: 11/20/2016 CLINICAL DATA:  Pressure ulcer on sacrum EXAM: SACRUM AND COCCYX - 2+ VIEW COMPARISON:  MRI 05/14/2013 FINDINGS: There is probable colonic erosion of the coccyx, similar to prior MRI. No definite new bone destruction to suggest acute osteomyelitis. If there is high clinical suspicion, this would be better evaluated with MRI. Chronic sclerosis and deformity of the left inferior pubic ramus, likely related to prior/chronic osteomyelitis. IMPRESSION: Suspect changes from chronic osteomyelitis in the coccyx and left inferior pubic ramus. No definite acute bony destruction. If there is high clinical suspicion for acute osteomyelitis, MRI would be more sensitive. Electronically Signed   By: Charlett NoseKevin  Dover M.D.   On: 11/20/2016 10:47    ASSESSMENT AND PLAN:   Active Problems:   Decubitus ulcer   Herpes simplex infection of penis   Protein-calorie malnutrition, severe  Patient is 41 year old with paraplegia with decubitus ulcer  1. Sepsis due to infected decubitus ulcer S/p debridement by surgery- 11/19/16 Wound care consult Treat with broad-spectrum antibiotics with IV vancomycin and Zosyn Mostly the ulcer is not healing due to pressure, may need either LTAC or more help than going home. Jay SchlichterXry shows questionable Osteomyelitis , get MRI.  2. Likely herpetic penile lesions Treat with Valtrex   Urology consult appreciated.   Plan for cystoscopy.  3. Bladder dysfunction I will restart his Ditropan which she has not been taking  4. Failure to thrive I will restart his Wellbutrin and Megace which she is also not taking  5. Misc: lovenox for dvt proph  6. Nicotine  abuse : Smoking cessation provided I recommended he stop smoking patient , started on nicotine patch four minutes spent   All the records are reviewed and case discussed with Care Management/Social Workerr. Management plans discussed with the patient, family and they are in agreement.  CODE STATUS: full  TOTAL TIME TAKING CARE OF THIS PATIENT: 35 minutes.    POSSIBLE D/C IN 1-2 DAYS, DEPENDING ON CLINICAL CONDITION.   Altamese DillingVACHHANI, Veneda Kirksey M.D on 11/20/2016   Between 7am to 6pm - Pager - [phone]  After 6pm go to [email or web address] - password [email or web address] [email or web address]  [facility]  Office  [phone]  CC: Primary care physician; [place], [facility]  Note: This dictation was prepared with Dragon dictation along with smaller phrase technology. Any transcriptional errors that result from this process are unintentional.

## 2016-11-20 NOTE — Evaluation (Signed)
Physical Therapy Evaluation and Discharge Patient Details Name: Jonathon LeedsRichard L York MRN: [id number] DOB: 1975/11/06 Today's Date: 11/20/2016   History of Present Illness  Pt is a 41 y/o M who presented with decubitus ulcer who was hospitalized a few months PTA and [facility] and then to [facility] SNF before returning home.  Since returning home he has not had follow up with wound care and he has noticed his decubitus ulcer worsening.  Pt's PMH includes neurogenic bladder, paraplegia, osteomylelitis, R toe amputation.    Clinical Impression  Pt admitted with above diagnosis. Jonathon York is at a modified independent level of mobility at home.  He is able to perform WC<>bed transfers independently and reports that he regularly performs repositioning every 2 hours.  He is able to cook and clean without assist and has an adapted car that he is able to drive.  He demonstrates independence and safety with scoot transfer from bed<>drop arm chair today with no cues or assist required.  Pt would benefit from an air mattress to prevent worsening of existing or new pressure ulcers.  Per wound care nurse, the pt is currently refusing air mattress as he says, "they don't work, they just make it worse". Unfortunately, due to the site of pt's pressure ulcer he is unable to care for it and will need assist with this moving forward. No skilled PT needs identified, PT will sign off.     Follow Up Recommendations No PT follow up    Equipment Recommendations  Other (comment) (Air mattress )    Recommendations for Other Services       Precautions / Restrictions Precautions Precautions: Fall Precaution Comments: h/o paraplegia since age 41 Restrictions Weight Bearing Restrictions: No      Mobility  Bed Mobility Overal bed mobility: Independent             General bed mobility comments: No cues or physical assist required.  Pt uses UEs to advance LEs to EOB and again when getting into  bed.  Transfers Overall transfer level: Independent Equipment used:  (drop arm chair)             General transfer comment: Pt scoots from bed to chair with drop arm using BUEs to push up when scooting.  No physical assist or cues needed, pt performs completely independently and with safe technique.  He repeats this to return to bed from chair.  Ambulation/Gait             General Gait Details: Pt non ambulatory  Stairs            Wheelchair Mobility    Modified Rankin (Stroke Patients Only)       Balance Overall balance assessment: Modified Independent Sitting-balance support: No upper extremity supported;Feet unsupported Sitting balance-Leahy Scale: Good                                       Pertinent Vitals/Pain Pain Assessment: Faces Faces Pain Scale: Hurts a little bit Pain Location: sit of pressure ulcer Pain Descriptors / Indicators: Discomfort Pain Intervention(s): Limited activity within patient's tolerance;Monitored during session    Home Living Family/patient expects to be discharged to:: Private residence Living Arrangements: Alone Available Help at Discharge:  (Pt reports no assist available) Type of Home: House Home Access: Ramped entrance     Home Layout: One level Home Equipment: Wheelchair - manual  Prior Function Level of Independence: Independent with assistive device(s)         Comments: Pt self propels WC with BUEs and is able to complete cooking, cleaning, bathing, dressing independently.  Pt has adapted car and is able to drive.  Pt reports his WC has an air pad seat on it.  His bed is a regular bed with a memory foam top.  He performs scoot pivot transfers from bed<>WC.  He spends more time in his bed than in his chair.  Pt reports he performs repositioning every 2 hrs when in bed.     Hand Dominance        Extremity/Trunk Assessment   Upper Extremity Assessment Upper Extremity Assessment:  Overall WFL for tasks assessed    Lower Extremity Assessment Lower Extremity Assessment:  (h/o paraplegia)    Cervical / Trunk Assessment Cervical / Trunk Assessment: Normal  Communication   Communication: No difficulties  Cognition Arousal/Alertness: Awake/alert Behavior During Therapy: WFL for tasks assessed/performed;Agitated Overall Cognitive Status: Within Functional Limits for tasks assessed                                        General Comments General comments (skin integrity, edema, etc.): Per Wound Care nurse the pt is refusing to use air mattress as he says, "they don't work, they just make it worse".    Exercises     Assessment/Plan    PT Assessment Patent does not need any further PT services  PT Problem List         PT Treatment Interventions      PT Goals (Current goals can be found in the Care Plan section)  Acute Rehab PT Goals Patient Stated Goal: to go to a facility that can care for his pressure ulcer PT Goal Formulation: All assessment and education complete, DC therapy    Frequency     Barriers to discharge        Co-evaluation               AM-PAC PT "6 Clicks" Daily Activity  Outcome Measure Difficulty turning over in bed (including adjusting bedclothes, sheets and blankets)?: None Difficulty moving from lying on back to sitting on the side of the bed? : None Difficulty sitting down on and standing up from a chair with arms (e.g., wheelchair, bedside commode, etc,.)?: Total Help needed moving to and from a bed to chair (including a wheelchair)?: None Help needed walking in hospital room?: Total Help needed climbing 3-5 steps with a railing? : Total 6 Click Score: 15    End of Session   Activity Tolerance: Patient tolerated treatment well Patient left: in bed;with call bell/phone within reach;with bed alarm set;with family/visitor present Nurse Communication: Mobility status PT Visit Diagnosis: Muscle weakness  (generalized) (M62.81)    Time: [id number] PT Time Calculation (min) (ACUTE ONLY): 26 min   Charges:   PT Evaluation $PT Eval Low Complexity: 1 Procedure PT Treatments $Therapeutic Activity: 8-22 mins   PT G CodesEncarnacion York:        Jonathon York PT, [name] 11/20/2016, 12:27 PM

## 2016-11-20 NOTE — Progress Notes (Addendum)
Initial Nutrition Assessment  DOCUMENTATION CODES:   Severe malnutrition in context of acute illness/injury  INTERVENTION:  1. Ensure Enlive po BID, each supplement provides 350 kcal and 20 grams of protein 2. Pro-Stat 30mL BID each supplement provides 100 calories, 15 protein 3. MVI w/ Minerals  NUTRITION DIAGNOSIS:   Increased nutrient needs related to wound healing as evidenced by estimated needs.  GOAL:   Patient will meet greater than or equal to 90% of their needs  MONITOR:   PO intake, I & O's, Labs, Supplement acceptance  REASON FOR ASSESSMENT:   Malnutrition Screening Tool    ASSESSMENT:   Jonathon York is a [age] yo male with PMH of paraplegia, CHF, COPD, DM, HTN, who presents with Sepsis secondary to infected decubitus ulcer, FTT, bladder dysfunction, possible urethal obstruction r/t herpetic lesions. He has lost 30 pounds over the past 3-4 months, lives alone, has been unable to care for his sacral ulcers. Now has new moderately large stage 4 PU to buttocks  1 day s/p sharp debridement of ulcers Spoke with patient, family at bedside.  He reports losing weight from 130lbs to 109 lbs indicating a 21#/16% severe wt loss over 2 months. Normally consumes 2 meals per day, mostly fast food from [employer], [employer], occassionally cooks. States he eats big portions and has a good appetite but continues to lose weight, likely related to decubitus ulcers. Did not eat anything for 4-5 days PTA because "I got sick." Denies any nausea/vomiting currently. Nutrition-Focused physical exam completed. WNL for paraplegia. Discussed patient with RN  Labs reviewed:  K+ 2.8, Mg 1.5,  Medications reviewed and include:  Megace 800mg , Phenergan PRN  Intake/Output Summary (Last 24 hours) at 11/20/16 1553 Last data filed at 11/20/16 [id number]  Gross per 24 hour  Intake             12.5 ml  Output             1150 ml  Net          -1137.5 ml    Diet Order:  Diet regular Room service  appropriate? Yes; Fluid consistency: Thin  Skin:  Wound (see comment) (Stg 3 to buttocks, Stg 3 to sacrum, Unstagable to buttocks)  Last BM:  PTA  Height:   Ht Readings from Last 1 Encounters:  11/19/16 5\' 7"  (1.702 m)    Weight:   Wt Readings from Last 1 Encounters:  11/20/16 105 lb 14.4 oz (48 kg)    Ideal Body Weight:  63.91 kg 5% subtracted for paraplegia  BMI:  Body mass index is 16.59 kg/m.  Estimated Nutritional Needs:   Kcal:  1700-2000 calories (35-40 cal/kg)  Protein:  72-95 grams (1.5-2g/kg)  Fluid:  1.7-2.0L  EDUCATION NEEDS:   Education needs addressed  Dionne AnoWilliam M. Elysse Polidore, MS, RD LDN Inpatient Clinical Dietitian Pager [phone]

## 2016-11-20 NOTE — Progress Notes (Signed)
Subjective:  Patient underwent a sharp bedside debridement of his decubitus ulcers last night. He is without complaint this morning.  Vital signs in last 24 hours: Temp:  [98.3 F (36.8 C)-99.5 F (37.5 C)] 98.8 F (37.1 C) (07/25 0508) Pulse Rate:  [90-133] 90 (07/25 0508) Resp:  [13-25] 19 (07/25 0508) BP: (98-128)/(51-85) 98/51 (07/25 0508) SpO2:  [97 %-100 %] 97 % (07/25 0508) Weight:  [43.1 kg (95 lb)-48 kg (105 lb 14.4 oz)] 48 kg (105 lb 14.4 oz) (07/25 0508) Last BM Date:  (pt unsure)  Intake/Output from previous day: 07/24 0701 - 07/25 0700 In: 1762.5 [I.V.:1512.5; IV Piggyback:250] Out: 1150 [Urine:1150]  GI: soft, non-tender; bowel sounds normal; no masses,  no organomegaly  Skin: Multiple dressings in place to his to keep his ulcers. They are clean and dry without evidence of drainage. Not taken down as they were just replaced. GU: Multiple tender nodules to the shaft of his penis. Foley catheter in place.  Lab Results:  CBC  Recent Labs  11/19/16 1306 11/20/16 0320  WBC 13.6* 10.9*  HGB 10.2* 8.5*  HCT 33.7* 27.5*  PLT 435 336   CMP     Component Value Date/Time   NA 137 11/20/2016 0320   NA 142 12/25/2013 0542   K 2.8 (L) 11/20/2016 0320   K 4.0 12/25/2013 0542   CL 109 11/20/2016 0320   CL 110 (H) 12/25/2013 0542   CO2 19 (L) 11/20/2016 0320   CO2 26 12/25/2013 0542   GLUCOSE 113 (H) 11/20/2016 0320   GLUCOSE 91 12/25/2013 0542   BUN <5 (L) 11/20/2016 0320   BUN 7 12/25/2013 0542   CREATININE 0.34 (L) 11/20/2016 0320   CREATININE 0.74 12/30/2013 0518   CALCIUM 8.0 (L) 11/20/2016 0320   CALCIUM 8.4 (L) 12/25/2013 0542   PROT 8.0 11/19/2016 1306   PROT 6.7 06/10/2012 1446   ALBUMIN 2.9 (L) 11/19/2016 1306   ALBUMIN 2.8 (L) 06/10/2012 1446   AST 11 (L) 11/19/2016 1306   AST 13 (L) 06/10/2012 1446   ALT 8 (L) 11/19/2016 1306   ALT 15 06/10/2012 1446   ALKPHOS 89 11/19/2016 1306   ALKPHOS 95 06/10/2012 1446   BILITOT 1.0 11/19/2016 1306    BILITOT 0.2 06/10/2012 1446   GFRNONAA >60 11/20/2016 0320   GFRNONAA >60 12/30/2013 0518   GFRAA >60 11/20/2016 0320   GFRAA >60 12/30/2013 0518   PT/INR No results for input(s): LABPROT, INR in the last 72 hours.  Studies/Results: Dg Chest 2 View  Result Date: 11/19/2016 CLINICAL DATA:  41 year old with chronic bilateral lower extremity paraplegia, now unable to self catheterize due to pressure sores on the penis. Tachycardia upon arrival to the emergency department. EXAM: CHEST  2 VIEW COMPARISON:  04/15/2016, 12/24/2013 and earlier. FINDINGS: Cardiomediastinal silhouette unremarkable, unchanged. Stable hyperinflation. Lungs clear. Bronchovascular markings normal. Pulmonary vascularity normal. No visible pleural effusions. No pneumothorax. Harrington rods from T7 through L1. IMPRESSION: Stable hyperinflation indicating COPD and/or asthma. No acute cardiopulmonary disease. Electronically Signed   By: Hulan Saashomas  Lawrence M.D.   On: 11/19/2016 14:49    Assessment/Plan: 41 year old male now 1 day status post sharp debridement of his right buttocks decubitus ulcer. Without complaints this morning. Continue local wound care. Needs continued support with pressure offloading. He will likely need referral to a plastic surgeon for definitive care once the local infection is cleared. Urology consult placed for his penile disorder. Surgery will continue to follow with you.   Ricarda Frameharles Evangelene Vora, MD FACS  General Surgeon [facility]  [facility] [phone](7a-7p) [phone] Night [facility] [phone](7p-7a) [phone]  11/20/2016

## 2016-11-20 NOTE — Consult Note (Addendum)
WOC Nurse wound consult note Reason for Consult:stage III, healing stage IV, unstageable ulcers Wound type: pressure ulcer Pressure Injury POA: Yes Measurement:Sacrum was noted as a Stage IV by WOC RN 8 months ago. Healing stage IV, 10cm x 10cm x 0.4cm with 100% pink wound bed, moderate serosanguinous drainage, intact periwound area. Left ischial wound stage III is 7cm x 6cm x 1.2cm with 80% pink wound bed, and 20% white hardened skin at distal area of wound with closed wound edges, mod serosanguinous drainage, no odor noted. Right trochanter unstageable wound, 6cm x 6cm x 1.4cm with 1.5cm undermining from 2 o'clock to 6 o'clock position with 2cm circle area in center covered with tan and black slough, moderate drainage, blanchable erythema surrounding wound. Surgeon note states he debrided this wound yesterday.  Wound bed: see above Drainage (amount, consistency, odor) see above Periwound: see above Dressing procedure/placement/frequency: Will place wound orders for Aquacell Ag+ cover with foam for absorption as well as antibacterial properties. Pt refuses a low air loss mattress of any kind.  States he hates them and has tried them multiple times here and at home and he will not lay on one.  Discussed nutrition with patient as he appears malnourished. States he eats what he can get.  Could benefit from nutritional consult, please order if you agree. With sacrum, right trochanter, and left ischeal wounds patient limited in how he can lay to keep pressure off all wounds. Educated about frequency of changing position.  Pt states he sits in a wheel chair for long periods. States his cushion is 41 years old, will order new wheelchair pad. Discussed the repositioning weight off the ischial wound when in chair.  Pt would like to look into getting new wheelchair. Suggested he ask for a Social Work consult. We will not follow, but will remain available to this patient, to nursing, and the medical and/or surgical  teams.  Please re-consult if we need to assist further.   Barnett HatterMelinda Brieonna Crutcher, RN-C, WTA-C, [facility]

## 2016-11-21 ENCOUNTER — Ambulatory Visit (HOSPITAL_COMMUNITY)
Admission: AD | Admit: 2016-11-21 | Discharge: 2016-11-21 | Disposition: A | Discharge status: Home / Self Care | Payer: Medicare Other | Source: Other Acute Inpatient Hospital | Attending: Internal Medicine | Admitting: Internal Medicine

## 2016-11-21 DIAGNOSIS — L89314 Pressure ulcer of right buttock, stage 4: Secondary | ICD-10-CM

## 2016-11-21 DIAGNOSIS — N509 Disorder of male genital organs, unspecified: Secondary | ICD-10-CM | POA: Insufficient documentation

## 2016-11-21 LAB — MAGNESIUM: MAGNESIUM: 1.8 mg/dL (ref 1.7–2.4)

## 2016-11-21 LAB — BASIC METABOLIC PANEL
Anion gap: 7 (ref 5–15)
BUN: 6 mg/dL (ref 6–20)
CALCIUM: 8.5 mg/dL — AB (ref 8.9–10.3)
CHLORIDE: 108 mmol/L (ref 101–111)
CO2: 26 mmol/L (ref 22–32)
CREATININE: 0.45 mg/dL — AB (ref 0.61–1.24)
GFR calc non Af Amer: >60 mL/min (ref >60)
Glucose, Bld: 102 mg/dL — ABNORMAL HIGH (ref 65–99)
Potassium: 2.8 mmol/L — ABNORMAL LOW (ref 3.5–5.1)
SODIUM: 141 mmol/L (ref 135–145)

## 2016-11-21 LAB — CBC
HCT: 27.4 % — ABNORMAL LOW (ref 40.0–52.0)
Hemoglobin: 8.6 g/dL — ABNORMAL LOW (ref 13.0–18.0)
MCH: 19.8 pg — AB (ref 26.0–34.0)
MCHC: 31.5 g/dL — ABNORMAL LOW (ref 32.0–36.0)
MCV: 62.6 fL — AB (ref 80.0–100.0)
PLATELETS: 407 10*3/uL (ref 150–440)
RBC: 4.37 MIL/uL — AB (ref 4.40–5.90)
RDW: 18.4 % — AB (ref 11.5–14.5)
WBC: 9.9 10*3/uL (ref 3.8–10.6)

## 2016-11-21 LAB — PROCALCITONIN: Procalcitonin: 1.24 ng/mL

## 2016-11-21 LAB — HIV ANTIBODY (ROUTINE TESTING W REFLEX): HIV Screen 4th Generation wRfx: NONREACTIVE

## 2016-11-21 MED ORDER — METRONIDAZOLE 250 MG PO TABS: 250.0000 mg | ORAL_TABLET | Freq: Four times a day (QID) | ORAL | 1 refills | Status: AC

## 2016-11-21 MED ORDER — DEXTROSE 5 % IV SOLN: 1.0000 g | INTRAVENOUS | 0 refills | Status: DC

## 2016-11-21 MED ORDER — MUPIROCIN 2 % EX OINT: 1.0000 "application " | TOPICAL_OINTMENT | Freq: Two times a day (BID) | CUTANEOUS | 0 refills | Status: DC

## 2016-11-21 MED ORDER — MEGESTROL ACETATE 40 MG/ML PO SUSP: 800.0000 mg | Freq: Every day | ORAL | 0 refills | Status: DC

## 2016-11-21 MED ORDER — METRONIDAZOLE 250 MG PO TABS
250.0000 mg | ORAL_TABLET | Freq: Four times a day (QID) | ORAL | Status: DC
  Administered 2016-11-21: 250 mg via ORAL
  Filled 2016-11-21 (×3): qty 1

## 2016-11-21 MED ORDER — DEXTROSE 5 % IV SOLN
1.0000 g | INTRAVENOUS | Status: DC
  Administered 2016-11-21: 1 g via INTRAVENOUS
  Filled 2016-11-21 (×3): qty 10

## 2016-11-21 MED ORDER — NICOTINE 7 MG/24HR TD PT24: 7.0000 mg | MEDICATED_PATCH | Freq: Every day | TRANSDERMAL | 0 refills | Status: DC

## 2016-11-21 MED ORDER — VALACYCLOVIR HCL 1 G PO TABS: 1000.0000 mg | ORAL_TABLET | Freq: Two times a day (BID) | ORAL | 0 refills | Status: AC

## 2016-11-21 MED ORDER — PRO-STAT SUGAR FREE PO LIQD: 30.0000 mL | Freq: Two times a day (BID) | ORAL | 0 refills | Status: DC

## 2016-11-21 MED ORDER — ENSURE ENLIVE PO LIQD: 237.0000 mL | Freq: Two times a day (BID) | ORAL | 12 refills | Status: DC

## 2016-11-21 MED ORDER — ADULT MULTIVITAMIN W/MINERALS CH: 1.0000 | ORAL_TABLET | Freq: Every day | ORAL | 0 refills | Status: DC

## 2016-11-21 MED ORDER — HYDROCODONE-ACETAMINOPHEN 5-325 MG PO TABS: 1.0000 | ORAL_TABLET | ORAL | 0 refills | Status: DC | PRN

## 2016-11-21 MED ORDER — CHLORHEXIDINE GLUCONATE CLOTH 2 % EX PADS: 6.0000 | MEDICATED_PAD | Freq: Every day | CUTANEOUS | 0 refills | Status: DC

## 2016-11-21 MED ORDER — POTASSIUM CHLORIDE CRYS ER 20 MEQ PO TBCR
40.0000 meq | EXTENDED_RELEASE_TABLET | Freq: Two times a day (BID) | ORAL | Status: DC
  Administered 2016-11-21: 40 meq via ORAL
  Filled 2016-11-21: qty 2

## 2016-11-21 MED ORDER — SODIUM CHLORIDE 0.9 % IV SOLN: 500.0000 mg | Freq: Two times a day (BID) | INTRAVENOUS | 0 refills | Status: AC

## 2016-11-21 MED ORDER — POTASSIUM CHLORIDE CRYS ER 20 MEQ PO TBCR: 40.0000 meq | EXTENDED_RELEASE_TABLET | Freq: Every day | ORAL | 0 refills | Status: DC

## 2016-11-21 MED ORDER — SODIUM CHLORIDE 0.9% FLUSH: 10.0000 mL | INTRAVENOUS | Status: DC | PRN

## 2016-11-21 NOTE — Progress Notes (Signed)
Pt discharged to [facility]. Transportation via [facility]. IV removed.

## 2016-11-21 NOTE — Progress Notes (Signed)
Peripherally Inserted Central Catheter/Midline Placement  The IV Nurse has discussed with the patient and/or persons authorized to consent for the patient, the purpose of this procedure and the potential benefits and risks involved with this procedure.  The benefits include less needle sticks, lab draws from the catheter, and the patient may be discharged home with the catheter. Risks include, but not limited to, infection, bleeding, blood clot (thrombus formation), and puncture of an artery; nerve damage and irregular heartbeat and possibility to perform a PICC exchange if needed/ordered by physician.  Alternatives to this procedure were also discussed.  Bard Power PICC patient education guide, fact sheet on infection prevention and patient information card has been provided to patient /or left at bedside.    PICC/Midline Placement Documentation  PICC Double Lumen 11/21/16 PICC Right Basilic 38 cm 0 cm (Active)  Indication for Insertion or Continuance of Line Home intravenous therapies (PICC only) 11/21/2016  2:15 PM  Exposed Catheter (cm) 0 cm 11/21/2016  2:15 PM  Site Assessment Clean;Dry;Intact 11/21/2016  2:15 PM  Lumen #1 Status Blood return noted;Flushed;Saline locked 11/21/2016  2:15 PM  Lumen #2 Status Blood return noted;Flushed;Saline locked 11/21/2016  2:15 PM  Dressing Type Transparent 11/21/2016  2:15 PM  Dressing Status Clean;Dry;Intact;Antimicrobial disc in place 11/21/2016  2:15 PM  Dressing Intervention New dressing 11/21/2016  2:15 PM  Dressing Change Due 11/28/16 11/21/2016  2:15 PM       Netta Corriganhomas, Chenita Ruda L 11/21/2016, 2:33 PM

## 2016-11-21 NOTE — Care Management (Signed)
MRI shows osteo.  Will reqire prolonged IV antibiotics. Patient approved for LTACH. [facility].  Bedside RN to call report.  Patient to transfer via [facility].

## 2016-11-21 NOTE — Discharge Instructions (Signed)
Need to check CBC, BMP , sedimentation rate every week. Need to check vancomycine through as needed.  Need ID physician follow up in 2-3 weeks and decide the length of Abx therapy. Need Wound care follow up. Air mattress, PIC line care.

## 2016-11-21 NOTE — Care Management (Signed)
Patient admitted from home with sepsis.  Patient with paraplegia and decubitus ulcer.  Patient s/p debridement by surgery.  Per surgery patient will likely need prolonged IV antibiotics.  RNCM consult for LTACH placed.  Patient was placed at [facility] in December of 2017.  [facility] and would like [facility].  Patient would not qualify for [facility].  Loury from [facility] notified 11/20/16.  Should patient not qualify for LTACH he would like to pursue SNF placement.  CSW updated.  RNCM following.

## 2016-11-21 NOTE — Discharge Summary (Signed)
[facility] - [place] at [facility]   PATIENT NAME: Jonathon York    MR#:  [id number]  DATE OF BIRTH:  1983-05-06  DATE OF ADMISSION:  11/19/2016 ADMITTING PHYSICIAN: Auburn Bilberry, MD  DATE OF DISCHARGE: No discharge date for patient encounter.  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  SECONDARY DIAGNOSIS:   Past Medical History:  Diagnosis Date  . Anxiety   . Decubitus ulcer   . Depression   . Neurogenic bladder   . Osteomyelitis (HCC)   . 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 Sepsis due to infected decubitus ulcer S/p debridement by surgery- 11/19/16 Wound care consult Treat with broad-spectrum antibiotics with IV vancomycin and Zosyn  MRI shows Osteomyelitis on right ischeal tuberosity Spoke to ID at [place]- for now IV vanc, IV rocephin+ Oral flagyl for 6 week.   Need to follow with ID as out pt in  3-4 weeks to decide the length of Abx therapy. PICC line is requested.  2. 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   Plan for cystoscopy as out pt.   Advise dot keep foley in for 3-4 days and then pt can do clean in and out cath.  3. Bladder dysfunction I will restart his Ditropan which she has not been taking   Cystoscopy as out pt.  4. Failure to thrive I will restart his Wellbutrin and Megace which she is also not taking  5. Misc: lovenox for dvt proph  6. Nicotine abuse : Smoking cessation  provided I recommended he stop smoking patient , started on nicotine patch fourminutes spent  DISCHARGE CONDITIONS:   Stable.  CONSULTS OBTAINED:  Treatment Team:  Leafy Ro, MD Vanna Scotland, MD  DRUG ALLERGIES:   Allergies  Allergen Reactions  . Orange Fruit [Citrus] Other (See Comments)    blisters    DISCHARGE MEDICATIONS:   Current Discharge Medication List    START taking these medications   Details  Amino Acids-Protein Hydrolys (FEEDING SUPPLEMENT, PRO-STAT SUGAR FREE 64,) LIQD Take 30 mLs by mouth 2 (two) times daily. Qty: 900 mL, Refills: 0    cefTRIAXone 1 g in dextrose 5 % 50 mL Inject 1 g into the vein daily. Qty: 40 Dose, Refills: 0    Chlorhexidine Gluconate Cloth 2 % PADS Apply 6 each topically daily at 6 (six) AM. Qty: 6 each, Refills: 0    feeding supplement, ENSURE ENLIVE, (ENSURE ENLIVE) LIQD Take 237 mLs by mouth 2 (two) times daily between meals. Qty: 237 mL, Refills: 12    HYDROcodone-acetaminophen (NORCO/VICODIN) 5-325 MG tablet Take 1-2 tablets by mouth every 4 (four) hours as needed for moderate pain. Qty: 30 tablet, Refills: 0    metroNIDAZOLE (FLAGYL) 250 MG tablet Take 1 tablet (250 mg total) by mouth every 6 (six) hours. Qty: 120 tablet, Refills: 1    Multiple Vitamin (MULTIVITAMIN WITH MINERALS) TABS tablet Take 1 tablet by mouth daily. Qty: 30 tablet, Refills: 0    mupirocin ointment (  BACTROBAN) 2 % Place 1 application into the nose 2 (two) times daily. Qty: 22 g, Refills: 0    nicotine (NICODERM CQ - DOSED IN MG/24 HR) 7 mg/24hr patch Place 1 patch (7 mg total) onto the skin daily. Qty: 28 patch, Refills: 0    potassium chloride SA (K-DUR,KLOR-CON) 20 MEQ tablet Take 2 tablets (40 mEq total) by mouth daily. Qty: 5 tablet, Refills: 0    valACYclovir (VALTREX) 1000 MG tablet Take 1 tablet (1,000 mg total) by mouth 2 (two) times daily. Qty: 18 tablet, Refills: 0    vancomycin 500 mg in sodium chloride 0.9 % 100 mL Inject  500 mg into the vein every 12 (twelve) hours. Qty: 84 Dose, Refills: 0      CONTINUE these medications which have CHANGED   Details  megestrol (MEGACE) 40 MG/ML suspension Take 20 mLs (800 mg total) by mouth daily. Qty: 240 mL, Refills: 0      CONTINUE these medications which have NOT CHANGED   Details  oxybutynin (DITROPAN) 5 MG tablet Take 15 mg by mouth 2 (two) times daily.    buPROPion (WELLBUTRIN XL) 150 MG 24 hr tablet Take 1 tablet (150 mg total) by mouth daily. Qty: 30 tablet, Refills: 0    promethazine (PHENERGAN) 25 MG tablet Take 1 tablet (25 mg total) by mouth every 6 (six) hours as needed for nausea or vomiting. Qty: 20 tablet, Refills: 0      STOP taking these medications     bisacodyl (DULCOLAX) 5 MG EC tablet      diphenhydrAMINE (BENADRYL) 25 mg capsule      docusate sodium (COLACE) 100 MG capsule      piperacillin-tazobactam (ZOSYN) 3.375 GM/50ML IVPB          DISCHARGE INSTRUCTIONS:   Need to check CBC, BMP , sedimentation rate every week. Need to check vancomycine through as needed.  Need ID physician follow up in 2-3 weeks and decide the length of Abx therapy. Need Wound care follow up. Air mattress, PIC line care.   Due to penile herpes lesion- d/c with foley for 3-4 days. Then resume in and out cath. Need to follow in urology clinic in 10 days for cystoscopy.   If you experience worsening of your admission symptoms, develop shortness of breath, life threatening emergency, suicidal or homicidal thoughts you must seek medical attention immediately by calling 911 or calling your MD immediately  if symptoms less severe.  You Must read complete instructions/literature along with all the possible adverse reactions/side effects for all the Medicines you take and that have been prescribed to you. Take any new Medicines after you have completely understood and accept all the possible adverse reactions/side effects.   Please note  You were cared  for by a hospitalist during your hospital stay. If you have any questions about your discharge medications or the care you received while you were in the hospital after you are discharged, you can call the unit and asked to speak with the hospitalist on call if the hospitalist that took care of you is not available. Once you are discharged, your primary care physician will handle any further medical issues. Please note that NO REFILLS for any discharge medications will be authorized once you are discharged, as it is imperative that you return to your primary care physician (or establish a relationship with a primary care physician if you do not have one) for your aftercare needs so that they can reassess your need for  medications and monitor your lab values.    Today   CHIEF COMPLAINT:   Chief Complaint  Patient presents with  . Dysuria    HISTORY OF PRESENT ILLNESS:  Marshia LyRichard Sagrero  is a 41 y.o. male with a known history of Paraplegia and history of decubitus ulcer who was hospitalized a few months prior and was sent to a nursing home. Patient reports that after being discharged from the nursing home he did not follow-up for any wound care or was seen by primary care provider. He is coming in with worsening decubitus ulcer. As well as cold chills. He also does in and out catheter on his own himself and unable to do that due to penile lesions. He is slightly tachycardic here as well as has leukocytosis here.   VITAL SIGNS:  Blood pressure (!) 95/54, pulse (!) 106, temperature 98.9 F (37.2 C), temperature source Oral, resp. rate 20, height 5\' 7"  (1.702 m), weight 47.5 kg (104 lb 12.8 oz), SpO2 100 %.  I/O:   Intake/Output Summary (Last 24 hours) at 11/21/16 1438 Last data filed at 11/21/16 0608  Gross per 24 hour  Intake             1748 ml  Output             2075 ml  Net             -327 ml    PHYSICAL EXAMINATION:   GENERAL: 41 y.o.-year-old patient lying in the bed with no acute  distress.  EYES: Pupils equal, round, reactive to light and accommodation. No scleral icterus. Extraocular muscles intact.  HEENT: Head atraumatic, normocephalic. Oropharynx and nasopharynx clear.  NECK: Supple, no jugular venous distention. No thyroid enlargement, no tenderness.  LUNGS: Normal breath sounds bilaterally, no wheezing, rales,rhonchi or crepitation. No use of accessory muscles of respiration.  CARDIOVASCULAR: S1, S2 normal. No murmurs, rubs, or gallops.  ABDOMEN: Soft, nontender, nondistended. Bowel sounds present. No organomegaly or mass.  EXTREMITIES: No pedal edema, cyanosis, or clubbing.  NEUROLOGIC: Cranial nerves II through XII are intact. Muscle strength 5/5 in upper extremities, but 0/5 in lower extremities. Sensation intact. Gait not checked. Muscle wasting on lower limbs. PSYCHIATRIC: The patient is alert and oriented x 3.  SKIN:Large decubitus ulcer on sacrum with dressing. GU he has lesions noted in his penis.   DATA REVIEW:   CBC  Recent Labs Lab 11/21/16 0456  WBC 9.9  HGB 8.6*  HCT 27.4*  PLT 407    Chemistries   Recent Labs Lab 11/19/16 1306  11/21/16 0456  NA 133*  < > 141  K 3.6  < > 2.8*  CL 101  < > 108  CO2 17*  < > 26  GLUCOSE 101*  < > 102*  BUN 6  < > 6  CREATININE 0.70  < > 0.45*  CALCIUM 8.8*  < > 8.5*  MG  --   < > 1.8  AST 11*  --   --   ALT 8*  --   --   ALKPHOS 89  --   --   BILITOT 1.0  --   --   < > = values in this interval not displayed.  Cardiac Enzymes No results for input(s): TROPONINI in the last 168 hours.  Microbiology Results  Results for orders placed or performed during the hospital encounter of 11/19/16  Blood Culture (routine x 2)     Status: None (Preliminary result)   Collection  Time: 11/19/16  1:08 PM  Result Value Ref Range Status   Specimen Description BLOOD LEFT ANTECUBITAL  Final   Special Requests   Final    BOTTLES DRAWN AEROBIC AND ANAEROBIC Blood Culture adequate volume   Culture NO  GROWTH 2 DAYS  Final   Report Status PENDING  Incomplete  Blood Culture (routine x 2)     Status: None (Preliminary result)   Collection Time: 11/19/16  1:09 PM  Result Value Ref Range Status   Specimen Description BLOOD RIGHT ANTECUBITAL  Final   Special Requests   Final    BOTTLES DRAWN AEROBIC AND ANAEROBIC Blood Culture results may not be optimal due to an excessive volume of blood received in culture bottles   Culture NO GROWTH 2 DAYS  Final   Report Status PENDING  Incomplete  Urine Culture     Status: Abnormal   Collection Time: 11/19/16  2:18 PM  Result Value Ref Range Status   Specimen Description URINE, RANDOM  Final   Special Requests NONE  Final   Culture MULTIPLE SPECIES PRESENT, SUGGEST RECOLLECTION (A)  Final   Report Status 11/20/2016 FINAL  Final  MRSA PCR Screening     Status: Abnormal   Collection Time: 11/20/16  9:08 AM  Result Value Ref Range Status   MRSA by PCR POSITIVE (A) NEGATIVE Final    Comment:        The GeneXpert MRSA Assay (FDA approved for NASAL specimens only), is one component of a comprehensive MRSA colonization surveillance program. It is not intended to diagnose MRSA infection nor to guide or monitor treatment for MRSA infections. RESULT CALLED TO, READ BACK BY AND VERIFIED WITH: OLIVIA ROGERS AT 1115 ON 11/20/16 [facility].     RADIOLOGY:  Dg Chest 2 View  Result Date: 11/19/2016 CLINICAL DATA:  41 year old with chronic bilateral lower extremity paraplegia, now unable to self catheterize due to pressure sores on the penis. Tachycardia upon arrival to the emergency department. EXAM: CHEST  2 VIEW COMPARISON:  04/15/2016, 12/24/2013 and earlier. FINDINGS: Cardiomediastinal silhouette unremarkable, unchanged. Stable hyperinflation. Lungs clear. Bronchovascular markings normal. Pulmonary vascularity normal. No visible pleural effusions. No pneumothorax. Harrington rods from T7 through L1. IMPRESSION: Stable hyperinflation indicating COPD and/or  asthma. No acute cardiopulmonary disease. Electronically Signed   By: Hulan Saas M.D.   On: 11/19/2016 14:49   Dg Sacrum/coccyx  Result Date: 11/20/2016 CLINICAL DATA:  Pressure ulcer on sacrum EXAM: SACRUM AND COCCYX - 2+ VIEW COMPARISON:  MRI 05/14/2013 FINDINGS: There is probable colonic erosion of the coccyx, similar to prior MRI. No definite new bone destruction to suggest acute osteomyelitis. If there is high clinical suspicion, this would be better evaluated with MRI. Chronic sclerosis and deformity of the left inferior pubic ramus, likely related to prior/chronic osteomyelitis. IMPRESSION: Suspect changes from chronic osteomyelitis in the coccyx and left inferior pubic ramus. No definite acute bony destruction. If there is high clinical suspicion for acute osteomyelitis, MRI would be more sensitive. Electronically Signed   By: Charlett Nose M.D.   On: 11/20/2016 10:47   Mr Sacrum Si Joints Wo Contrast  Result Date: 11/21/2016 CLINICAL DATA:  Paraplegia, worsening sacral decubitus ulcer, concern for osteomyelitis EXAM: MRI SACRUM WITHOUT CONTRAST TECHNIQUE: Multiplanar, multisequence MR imaging of the sacrum was performed. No intravenous contrast was administered. COMPARISON:  MRI pelvis dated March 14, 2014. FINDINGS: There is a superficial decubitus ulcer overlying the sacrum. There is increased STIR signal within the posterior aspect of the left iliac  bone (image 10, series 9) surrounding a T1 hypointense line (image 7, series 4), consistent with nondisplaced insufficiency fracture. There is focal increased STIR marrow signal within the S3 and S4 segments (image 15, series 8), without corresponding T1 hypointensity, likely of reactive. Again seen is a deep decubitus ulcer extending near the left ischial tuberosity. Previously described abnormal marrow signal within the left ischial tuberosity has resolved. There is residual faint increased STIR signal within the left ischial tuberosity  without corresponding T1 signal, likely reactive. There is a superficial decubitus ulcer which extends to the right ischial tuberosity, which demonstrates new increased STIR signal with corresponding T1 hypointensity (image 24, series 6), consistent with osteomyelitis. There is asymmetric edema within the left greater than right piriformis muscles, as well as the left greater than right obturator internus muscles. There is no discrete fluid collection. Foley catheter within the bladder. There are multiple prominent bilateral internal iliac chain lymph nodes. IMPRESSION: 1. Findings consistent with osteomyelitis of the right ischial tuberosity. 2. Previously seen left ischial osteomyelitis has resolved. 3. Nondisplaced insufficiency fracture of the left posterior iliac bone. 4. 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 Electronically Signed   By: Obie Dredge M.D.   On: 11/21/2016 08:52    EKG:   Orders placed or performed during the hospital encounter of 11/19/16  . EKG 12-Lead  . EKG 12-Lead  . ED EKG 12-Lead  . ED EKG 12-Lead      Management plans discussed with the patient, family and they are in agreement.  CODE STATUS:     Code Status Orders        Start     Ordered   11/19/16 1721  Full code  Continuous     11/19/16 1720    Code Status History    Date Active Date Inactive Code Status Order ID Comments User Context   04/15/2016  6:02 PM 04/19/2016  2:11 AM Full Code [id number]  Shaune Pollack, MD Inpatient   04/29/2015 10:17 AM 04/30/2015  1:39 PM Full Code [id number]  Linus Galas, MD Inpatient   04/28/2015  4:38 AM 04/29/2015 10:17 AM Full Code [id number]  Milagros Loll, MD ED      TOTAL TIME TAKING CARE OF THIS PATIENT: 35 minutes.    Altamese Dilling M.D on 11/21/2016 at 2:38 PM  Between 7am to 6pm - Pager - [phone]  After 6pm go to [email or web address] - password [email or web address]  [facility] [place] [facility]  Office   [phone]  CC: Primary care physician; [facility]   Note: This dictation was prepared with Dragon dictation along with smaller phrase technology. Any transcriptional errors that result from this process are unintentional.

## 2016-11-21 NOTE — Progress Notes (Signed)
Found to have Osteomyelitis on MRI.  [facility]- he suggested to give IV vanc, IV rocephin and oral flagyl for 6 weeks. Get weekly CBC, BMP and sed rate. Check Vanc through level to adjust the dose.  He need to be followed by an infectious disease physician in 3-4 weeks. Will wait for LTAC arrangement. PICC line ordered.

## 2016-11-21 NOTE — Progress Notes (Signed)
Called [facility] and gave report to Benard RinkLouis Guerriero at 1600.

## 2016-11-21 NOTE — Progress Notes (Addendum)
Called [facility] for transportation at 16:50

## 2016-11-21 NOTE — Progress Notes (Signed)
CC: Decubitus ulcers Subjective: Patient without active complaint this morning. Tolerating a diet states he's carful with the care he's been receiving.  Objective: Vital signs in last 24 hours: Temp:  [98.1 F (36.7 C)-98.9 F (37.2 C)] 98.1 F (36.7 C) (07/26 0510) Pulse Rate:  [96-108] 100 (07/26 0510) Resp:  [14-19] 18 (07/26 0510) BP: (102-132)/(53-67) 107/67 (07/26 0510) SpO2:  [99 %-100 %] 100 % (07/26 0510) Weight:  [47.5 kg (104 lb 12.8 oz)] 47.5 kg (104 lb 12.8 oz) (07/26 0500) Last BM Date:  (pt unsure)  Intake/Output from previous day: 07/25 0701 - 07/26 0700 In: 1748 [I.V.:1637; IV Piggyback:111] Out: 2075 [Urine:2075] Intake/Output this shift: No intake/output data recorded.  Physical exam:  Gen.: No acute distress. Chest: Clear to auscultation Heart: Regular rhythm Abdomen: Soft and nontender Skin: Multiple decubitus ulcers. Measurements per wound care nurse. Sacrum with 10 x 10 x 0.4 cm healthy-appearing granulation tissue. Left initial wound 7 x 6 x 1.2 cm. Mostly healthy-appearing granulation tissue. Right trochanter with 6 x 6 x 1.4 cm. Combination of Black, tan, pink tissues. No purulent drainage today.  Lab Results: CBC   Recent Labs  11/20/16 0320 11/21/16 0456  WBC 10.9* 9.9  HGB 8.5* 8.6*  HCT 27.5* 27.4*  PLT 336 407   BMET  Recent Labs  11/20/16 0320 11/21/16 0456  NA 137 141  K 2.8* 2.8*  CL 109 108  CO2 19* 26  GLUCOSE 113* 102*  BUN <5* 6  CREATININE 0.34* 0.45*  CALCIUM 8.0* 8.5*   PT/INR No results for input(s): LABPROT, INR in the last 72 hours. ABG No results for input(s): PHART, HCO3 in the last 72 hours.  Invalid input(s): PCO2, PO2  Studies/Results: Dg Chest 2 View  Result Date: 11/19/2016 CLINICAL DATA:  41 year old with chronic bilateral lower extremity paraplegia, now unable to self catheterize due to pressure sores on the penis. Tachycardia upon arrival to the emergency department. EXAM: CHEST  2 VIEW  COMPARISON:  04/15/2016, 12/24/2013 and earlier. FINDINGS: Cardiomediastinal silhouette unremarkable, unchanged. Stable hyperinflation. Lungs clear. Bronchovascular markings normal. Pulmonary vascularity normal. No visible pleural effusions. No pneumothorax. Harrington rods from T7 through L1. IMPRESSION: Stable hyperinflation indicating COPD and/or asthma. No acute cardiopulmonary disease. Electronically Signed   By: Hulan Saashomas  Lawrence M.D.   On: 11/19/2016 14:49   Dg Sacrum/coccyx  Result Date: 11/20/2016 CLINICAL DATA:  Pressure ulcer on sacrum EXAM: SACRUM AND COCCYX - 2+ VIEW COMPARISON:  MRI 05/14/2013 FINDINGS: There is probable colonic erosion of the coccyx, similar to prior MRI. No definite new bone destruction to suggest acute osteomyelitis. If there is high clinical suspicion, this would be better evaluated with MRI. Chronic sclerosis and deformity of the left inferior pubic ramus, likely related to prior/chronic osteomyelitis. IMPRESSION: Suspect changes from chronic osteomyelitis in the coccyx and left inferior pubic ramus. No definite acute bony destruction. If there is high clinical suspicion for acute osteomyelitis, MRI would be more sensitive. Electronically Signed   By: Charlett NoseKevin  Dover M.D.   On: 11/20/2016 10:47   Mr Sacrum Si Joints Wo Contrast  Result Date: 11/21/2016 CLINICAL DATA:  Paraplegia, worsening sacral decubitus ulcer, concern for osteomyelitis EXAM: MRI SACRUM WITHOUT CONTRAST TECHNIQUE: Multiplanar, multisequence MR imaging of the sacrum was performed. No intravenous contrast was administered. COMPARISON:  MRI pelvis dated March 14, 2014. FINDINGS: There is a superficial decubitus ulcer overlying the sacrum. There is increased STIR signal within the posterior aspect of the left iliac bone (image 10, series 9) surrounding a  T1 hypointense line (image 7, series 4), consistent with nondisplaced insufficiency fracture. There is focal increased STIR marrow signal within the S3 and  S4 segments (image 15, series 8), without corresponding T1 hypointensity, likely of reactive. Again seen is a deep decubitus ulcer extending near the left ischial tuberosity. Previously described abnormal marrow signal within the left ischial tuberosity has resolved. There is residual faint increased STIR signal within the left ischial tuberosity without corresponding T1 signal, likely reactive. There is a superficial decubitus ulcer which extends to the right ischial tuberosity, which demonstrates new increased STIR signal with corresponding T1 hypointensity (image 24, series 6), consistent with osteomyelitis. There is asymmetric edema within the left greater than right piriformis muscles, as well as the left greater than right obturator internus muscles. There is no discrete fluid collection. Foley catheter within the bladder. There are multiple prominent bilateral internal iliac chain lymph nodes. IMPRESSION: 1. Findings consistent with osteomyelitis of the right ischial tuberosity. 2. Previously seen left ischial osteomyelitis has resolved. 3. Nondisplaced insufficiency fracture of the left posterior iliac bone. 4. 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 Electronically Signed   By: Obie DredgeWilliam T Derry M.D.   On: 11/21/2016 08:52    Anti-infectives: Anti-infectives    Start     Dose/Rate Route Frequency Ordered Stop   11/19/16 2300  vancomycin (VANCOCIN) 500 mg in sodium chloride 0.9 % 100 mL IVPB     500 mg 100 mL/hr over 60 Minutes Intravenous Every 12 hours 11/19/16 1601     11/19/16 2200  piperacillin-tazobactam (ZOSYN) IVPB 3.375 g     3.375 g 12.5 mL/hr over 240 Minutes Intravenous Every 8 hours 11/19/16 1556     11/19/16 1600  valACYclovir (VALTREX) tablet 1,000 mg     1,000 mg Oral 2 times daily 11/19/16 1529 11/29/16 2159   11/19/16 1531  valACYclovir (VALTREX) 500 MG tablet    Comments:  Chelsea Primusoss, Ashley   : cabinet override      11/19/16 1531 11/20/16 0344    11/19/16 1530  acyclovir (ZOVIRAX) 200 MG capsule 200 mg  Status:  Discontinued     200 mg Oral  Once 11/19/16 1501 11/19/16 1533   11/19/16 1315  piperacillin-tazobactam (ZOSYN) IVPB 3.375 g     3.375 g 100 mL/hr over 30 Minutes Intravenous  Once 11/19/16 1307 11/19/16 1502   11/19/16 1315  vancomycin (VANCOCIN) IVPB 1000 mg/200 mL premix     1,000 mg 200 mL/hr over 60 Minutes Intravenous  Once 11/19/16 1307 11/19/16 1502      Assessment/Plan:  41 year old male with multiple decubitus ulcers. Appreciate and agree with recommendations from the wound care nurse. Evidence of osteomyelitis on MRI. Defer management a loss male light as to medicine and infectious disease. Will likely need prolonged IV antibodies. Will need prolonged wound care. Given the size of these wounds very difficult situation for this patient. No plans for further surgical intervention at this time. Please call the general surgery services can be of further assistance with this patient's care.  Roshni Burbano T. Tonita CongWoodham, MD, [facility] General Surgeon [address] [facility]  Day [facility] [phone]) [phone] Night [facility] [phone](7p-[phone]) [phone] 11/21/2016

## 2016-11-22 ENCOUNTER — Telehealth: Payer: Self-pay | Admitting: Urology

## 2016-11-22 NOTE — Telephone Encounter (Signed)
App has been made  

## 2016-11-24 LAB — CULTURE, BLOOD (ROUTINE X 2)
Culture: NO GROWTH
Culture: NO GROWTH
SPECIAL REQUESTS: ADEQUATE

## 2016-12-27 ENCOUNTER — Other Ambulatory Visit: Payer: Self-pay | Admitting: Urology

## 2017-02-14 ENCOUNTER — Emergency Department: Payer: Medicare Other

## 2017-02-14 ENCOUNTER — Encounter: Payer: Self-pay | Admitting: Emergency Medicine

## 2017-02-14 ENCOUNTER — Inpatient Hospital Stay
Admission: EM | Admit: 2017-02-14 | Discharge: 2017-02-19 | DRG: 871 | Disposition: A | Discharge status: Skilled Nursing Facility | Payer: Medicare Other | Attending: Internal Medicine | Admitting: Internal Medicine

## 2017-02-14 DIAGNOSIS — A408 Other streptococcal sepsis: Secondary | ICD-10-CM | POA: Diagnosis not present

## 2017-02-14 DIAGNOSIS — Z7401 Bed confinement status: Secondary | ICD-10-CM | POA: Diagnosis not present

## 2017-02-14 DIAGNOSIS — L89159 Pressure ulcer of sacral region, unspecified stage: Secondary | ICD-10-CM | POA: Diagnosis present

## 2017-02-14 DIAGNOSIS — F332 Major depressive disorder, recurrent severe without psychotic features: Secondary | ICD-10-CM | POA: Diagnosis present

## 2017-02-14 DIAGNOSIS — Z681 Body mass index (BMI) 19 or less, adult: Secondary | ICD-10-CM | POA: Diagnosis not present

## 2017-02-14 DIAGNOSIS — F17210 Nicotine dependence, cigarettes, uncomplicated: Secondary | ICD-10-CM | POA: Diagnosis present

## 2017-02-14 DIAGNOSIS — L03818 Cellulitis of other sites: Secondary | ICD-10-CM | POA: Diagnosis present

## 2017-02-14 DIAGNOSIS — A419 Sepsis, unspecified organism: Principal | ICD-10-CM | POA: Diagnosis present

## 2017-02-14 DIAGNOSIS — E43 Unspecified severe protein-calorie malnutrition: Secondary | ICD-10-CM | POA: Diagnosis present

## 2017-02-14 DIAGNOSIS — Z86711 Personal history of pulmonary embolism: Secondary | ICD-10-CM

## 2017-02-14 DIAGNOSIS — R110 Nausea: Secondary | ICD-10-CM | POA: Diagnosis present

## 2017-02-14 DIAGNOSIS — L89154 Pressure ulcer of sacral region, stage 4: Secondary | ICD-10-CM | POA: Diagnosis present

## 2017-02-14 DIAGNOSIS — N319 Neuromuscular dysfunction of bladder, unspecified: Secondary | ICD-10-CM | POA: Diagnosis present

## 2017-02-14 DIAGNOSIS — R627 Adult failure to thrive: Secondary | ICD-10-CM | POA: Diagnosis present

## 2017-02-14 DIAGNOSIS — M8618 Other acute osteomyelitis, other site: Secondary | ICD-10-CM | POA: Diagnosis present

## 2017-02-14 DIAGNOSIS — M8668 Other chronic osteomyelitis, other site: Secondary | ICD-10-CM | POA: Diagnosis present

## 2017-02-14 DIAGNOSIS — B9620 Unspecified Escherichia coli [E. coli] as the cause of diseases classified elsewhere: Secondary | ICD-10-CM | POA: Diagnosis present

## 2017-02-14 DIAGNOSIS — Z993 Dependence on wheelchair: Secondary | ICD-10-CM

## 2017-02-14 DIAGNOSIS — G8220 Paraplegia, unspecified: Secondary | ICD-10-CM | POA: Diagnosis present

## 2017-02-14 LAB — CBC WITH DIFFERENTIAL/PLATELET
BASOS ABS: 0.1 10*3/uL (ref 0–0.1)
BASOS PCT: 1 %
EOS PCT: 0 %
Eosinophils Absolute: 0 10*3/uL (ref 0–0.7)
HEMATOCRIT: 30.6 % — AB (ref 40.0–52.0)
Hemoglobin: 9.8 g/dL — ABNORMAL LOW (ref 13.0–18.0)
Lymphocytes Relative: 9 %
Lymphs Abs: 1.2 10*3/uL (ref 1.0–3.6)
MCH: 23.9 pg — ABNORMAL LOW (ref 26.0–34.0)
MCHC: 32.2 g/dL (ref 32.0–36.0)
MCV: 74.4 fL — AB (ref 80.0–100.0)
MONO ABS: 0.7 10*3/uL (ref 0.2–1.0)
MONOS PCT: 5 %
NEUTROS ABS: 11.3 10*3/uL — AB (ref 1.4–6.5)
Neutrophils Relative %: 85 %
PLATELETS: 684 10*3/uL — AB (ref 150–440)
RBC: 4.11 MIL/uL — ABNORMAL LOW (ref 4.40–5.90)
RDW: 20.9 % — AB (ref 11.5–14.5)
WBC: 13.2 10*3/uL — ABNORMAL HIGH (ref 3.8–10.6)

## 2017-02-14 LAB — COMPREHENSIVE METABOLIC PANEL
ALBUMIN: 2.5 g/dL — AB (ref 3.5–5.0)
ALK PHOS: 134 U/L — AB (ref 38–126)
ALT: 29 U/L (ref 17–63)
AST: 21 U/L (ref 15–41)
Anion gap: 11 (ref 5–15)
BILIRUBIN TOTAL: 0.3 mg/dL (ref 0.3–1.2)
BUN: 11 mg/dL (ref 6–20)
CALCIUM: 8.7 mg/dL — AB (ref 8.9–10.3)
CO2: 25 mmol/L (ref 22–32)
Chloride: 101 mmol/L (ref 101–111)
Creatinine, Ser: 0.6 mg/dL — ABNORMAL LOW (ref 0.61–1.24)
GFR calc Af Amer: >60 mL/min (ref >60)
GLUCOSE: 110 mg/dL — AB (ref 65–99)
Potassium: 4 mmol/L (ref 3.5–5.1)
Sodium: 137 mmol/L (ref 135–145)
TOTAL PROTEIN: 8 g/dL (ref 6.5–8.1)

## 2017-02-14 LAB — URINALYSIS, COMPLETE (UACMP) WITH MICROSCOPIC
BILIRUBIN URINE: NEGATIVE
GLUCOSE, UA: NEGATIVE mg/dL
KETONES UR: NEGATIVE mg/dL
NITRITE: POSITIVE — AB
PROTEIN: NEGATIVE mg/dL
Specific Gravity, Urine: 1.019 (ref 1.005–1.030)
pH: 5 (ref 5.0–8.0)

## 2017-02-14 LAB — LACTIC ACID, PLASMA
LACTIC ACID, VENOUS: 2.1 mmol/L — AB (ref 0.5–1.9)
Lactic Acid, Venous: 0.7 mmol/L (ref 0.5–1.9)

## 2017-02-14 LAB — TROPONIN I

## 2017-02-14 MED ORDER — OXYBUTYNIN CHLORIDE 5 MG PO TABS
15.0000 mg | ORAL_TABLET | Freq: Two times a day (BID) | ORAL | Status: DC
  Administered 2017-02-15 – 2017-02-19 (×9): 15 mg via ORAL
  Filled 2017-02-14 (×10): qty 3

## 2017-02-14 MED ORDER — ONDANSETRON HCL 4 MG PO TABS: 4.0000 mg | ORAL_TABLET | Freq: Four times a day (QID) | ORAL | Status: DC | PRN

## 2017-02-14 MED ORDER — PIPERACILLIN-TAZOBACTAM 3.375 G IVPB 30 MIN
3.3750 g | Freq: Once | INTRAVENOUS | Status: AC
  Administered 2017-02-14: 3.375 g via INTRAVENOUS
  Filled 2017-02-14: qty 50

## 2017-02-14 MED ORDER — MORPHINE SULFATE (PF) 4 MG/ML IV SOLN
4.0000 mg | Freq: Once | INTRAVENOUS | Status: AC
  Administered 2017-02-14: 4 mg via INTRAVENOUS
  Filled 2017-02-14: qty 1

## 2017-02-14 MED ORDER — ONDANSETRON HCL 4 MG/2ML IJ SOLN
4.0000 mg | Freq: Four times a day (QID) | INTRAMUSCULAR | Status: DC | PRN
  Administered 2017-02-15 – 2017-02-19 (×2): 4 mg via INTRAVENOUS
  Filled 2017-02-14 (×2): qty 2

## 2017-02-14 MED ORDER — PIPERACILLIN-TAZOBACTAM 3.375 G IVPB
3.3750 g | Freq: Three times a day (TID) | INTRAVENOUS | Status: DC
  Administered 2017-02-15 – 2017-02-19 (×10): 3.375 g via INTRAVENOUS
  Filled 2017-02-14 (×11): qty 50

## 2017-02-14 MED ORDER — SODIUM CHLORIDE 0.9 % IV BOLUS (SEPSIS)
1000.0000 mL | Freq: Once | INTRAVENOUS | Status: AC
  Administered 2017-02-14: 1000 mL via INTRAVENOUS

## 2017-02-14 MED ORDER — ACETAMINOPHEN 500 MG PO TABS
1000.0000 mg | ORAL_TABLET | Freq: Once | ORAL | Status: AC
  Administered 2017-02-14: 1000 mg via ORAL
  Filled 2017-02-14: qty 2

## 2017-02-14 MED ORDER — NICOTINE 7 MG/24HR TD PT24
7.0000 mg | MEDICATED_PATCH | Freq: Every day | TRANSDERMAL | Status: DC
  Administered 2017-02-15 – 2017-02-19 (×6): 7 mg via TRANSDERMAL
  Filled 2017-02-14 (×7): qty 1

## 2017-02-14 MED ORDER — ACETAMINOPHEN 325 MG PO TABS
650.0000 mg | ORAL_TABLET | Freq: Four times a day (QID) | ORAL | Status: DC | PRN
  Administered 2017-02-17: 650 mg via ORAL
  Filled 2017-02-14: qty 2

## 2017-02-14 MED ORDER — SODIUM CHLORIDE 0.9 % IV SOLN
INTRAVENOUS | Status: DC
  Administered 2017-02-15 – 2017-02-16 (×3): via INTRAVENOUS

## 2017-02-14 MED ORDER — VANCOMYCIN HCL 500 MG IV SOLR
500.0000 mg | Freq: Two times a day (BID) | INTRAVENOUS | Status: DC
  Administered 2017-02-15: 500 mg via INTRAVENOUS
  Filled 2017-02-14 (×4): qty 500

## 2017-02-14 MED ORDER — ACETAMINOPHEN 650 MG RE SUPP: 650.0000 mg | Freq: Four times a day (QID) | RECTAL | Status: DC | PRN

## 2017-02-14 MED ORDER — IBUPROFEN 600 MG PO TABS
600.0000 mg | ORAL_TABLET | Freq: Once | ORAL | Status: AC
  Administered 2017-02-14: 600 mg via ORAL
  Filled 2017-02-14: qty 1

## 2017-02-14 MED ORDER — SODIUM CHLORIDE 0.9 % IV BOLUS (SEPSIS)
500.0000 mL | Freq: Once | INTRAVENOUS | Status: AC
  Administered 2017-02-14: 500 mL via INTRAVENOUS

## 2017-02-14 MED ORDER — ENOXAPARIN SODIUM 40 MG/0.4ML ~~LOC~~ SOLN
40.0000 mg | SUBCUTANEOUS | Status: DC
  Administered 2017-02-15 – 2017-02-16 (×3): 40 mg via SUBCUTANEOUS
  Filled 2017-02-14 (×3): qty 0.4

## 2017-02-14 MED ORDER — VANCOMYCIN HCL IN DEXTROSE 1-5 GM/200ML-% IV SOLN
1000.0000 mg | Freq: Once | INTRAVENOUS | Status: AC
  Administered 2017-02-14: 1000 mg via INTRAVENOUS
  Filled 2017-02-14: qty 200

## 2017-02-14 MED ORDER — MEGESTROL ACETATE 40 MG/ML PO SUSP
800.0000 mg | Freq: Every day | ORAL | Status: DC
  Administered 2017-02-18: 800 mg via ORAL
  Filled 2017-02-14 (×5): qty 20

## 2017-02-14 MED ORDER — HYDROCODONE-ACETAMINOPHEN 5-325 MG PO TABS
1.0000 | ORAL_TABLET | ORAL | Status: DC | PRN
  Administered 2017-02-15 (×2): 1 via ORAL
  Administered 2017-02-15: 2 via ORAL
  Administered 2017-02-16 (×2): 1 via ORAL
  Administered 2017-02-17: 2 via ORAL
  Administered 2017-02-17: 1 via ORAL
  Administered 2017-02-18 – 2017-02-19 (×5): 2 via ORAL
  Filled 2017-02-14: qty 2
  Filled 2017-02-14: qty 1
  Filled 2017-02-14: qty 2
  Filled 2017-02-14: qty 1
  Filled 2017-02-14 (×4): qty 2
  Filled 2017-02-14: qty 1
  Filled 2017-02-14: qty 2
  Filled 2017-02-14 (×3): qty 1

## 2017-02-14 NOTE — H&P (Signed)
[facility] - [place] at [facility]   PATIENT NAME: Jonathon York [place]    MR#:  [id number]  DATE OF BIRTH:  Nov 13, 1975  DATE OF ADMISSION:  02/14/2017  PRIMARY CARE PHYSICIAN: [place]n [place], [facility]   REQUESTING/REFERRING PHYSICIAN: Rifenbark, MD  CHIEF COMPLAINT:   Chief Complaint  Patient presents with  . Wound Infection    HISTORY OF PRESENT ILLNESS:  Jonathon York  is a 41 y.o. male who presents with complaint of malaise and foul smell from his decubitus ulcers. He came in to the ED and was found to meet sepsis criteria with tachycardia, fever, leukocytosis. Hospitalists were called for admission. On interviewing the patient he states that he was on eliquis recently after being diagnosed with pulmonary embolism. He states that he only took 30 days of his medication as he was unable to obtain the medicine afterwards. He denies any pulmonary symptoms at this time.  PAST MEDICAL HISTORY:   Past Medical History:  Diagnosis Date  . Anxiety   . Decubitus ulcer   . Depression   . Neurogenic bladder   . Osteomyelitis (HCC)   . Paraparesis of both lower limbs (HCC) 02/21/00    PAST SURGICAL HISTORY:   Past Surgical History:  Procedure Laterality Date  . AMPUTATION TOE Right 04/29/2015   Procedure: AMPUTATION TOE;  Surgeon: Linus Galasodd Cline, MD;  Location: [facility] ORS;  Service: Podiatry;  Laterality: Right;  . IRRIGATION AND DEBRIDEMENT BUTTOCKS    . TOE AMPUTATION Right     SOCIAL HISTORY:   Social History  Substance Use Topics  . Smoking status: Current Some Day Smoker    Packs/day: 0.50    Types: Cigarettes  . Smokeless tobacco: Never Used  . Alcohol use No    FAMILY HISTORY:   Family History  Problem Relation Age of Onset  . Lymphoma Sister     DRUG ALLERGIES:   Allergies  Allergen Reactions  . Orange Fruit [Citrus] Other (See Comments)    blisters    MEDICATIONS AT HOME:   Prior to Admission medications   Medication Sig Start  Date End Date Taking? Authorizing Provider  Amino Acids-Protein Hydrolys (FEEDING SUPPLEMENT, PRO-STAT SUGAR FREE 64,) LIQD Take 30 mLs by mouth 2 (two) times daily. 11/21/16   Altamese DillingVachhani, Vaibhavkumar, MD  buPROPion (WELLBUTRIN XL) 150 MG 24 hr tablet Take 1 tablet (150 mg total) by mouth daily. Patient not taking: Reported on 11/19/2016 04/18/16   Shaune Pollackhen, Qing, MD  cefTRIAXone 1 g in dextrose 5 % 50 mL Inject 1 g into the vein daily. 11/21/16   Altamese DillingVachhani, Vaibhavkumar, MD  Chlorhexidine Gluconate Cloth 2 % PADS Apply 6 each topically daily at 6 (six) AM. 11/22/16   Altamese DillingVachhani, Vaibhavkumar, MD  feeding supplement, ENSURE ENLIVE, (ENSURE ENLIVE) LIQD Take 237 mLs by mouth 2 (two) times daily between meals. 11/21/16   Altamese DillingVachhani, Vaibhavkumar, MD  HYDROcodone-acetaminophen (NORCO/VICODIN) 5-325 MG tablet Take 1-2 tablets by mouth every 4 (four) hours as needed for moderate pain. 11/21/16   Altamese DillingVachhani, Vaibhavkumar, MD  megestrol (MEGACE) 40 MG/ML suspension Take 20 mLs (800 mg total) by mouth daily. 11/21/16   Altamese DillingVachhani, Vaibhavkumar, MD  Multiple Vitamin (MULTIVITAMIN WITH MINERALS) TABS tablet Take 1 tablet by mouth daily. 11/22/16   Altamese DillingVachhani, Vaibhavkumar, MD  mupirocin ointment (BACTROBAN) 2 % Place 1 application into the nose 2 (two) times daily. 11/21/16   Altamese DillingVachhani, Vaibhavkumar, MD  nicotine (NICODERM CQ - DOSED IN MG/24 HR) 7 mg/24hr patch Place 1 patch (7 mg total)  onto the skin daily. 11/22/16   Altamese Dilling, MD  oxybutynin (DITROPAN) 5 MG tablet Take 15 mg by mouth 2 (two) times daily.    [provider]  potassium chloride SA (K-DUR,KLOR-CON) 20 MEQ tablet Take 2 tablets (40 mEq total) by mouth daily. 11/21/16   Altamese Dilling, MD  promethazine (PHENERGAN) 25 MG tablet Take 1 tablet (25 mg total) by mouth every 6 (six) hours as needed for nausea or vomiting. Patient not taking: Reported on 04/15/2016 07/16/15   Minna Antis, MD    REVIEW OF SYSTEMS:  Review of Systems   Constitutional: Positive for fever and malaise/fatigue. Negative for chills and weight loss.  HENT: Negative for ear pain, hearing loss and tinnitus.   Eyes: Negative for blurred vision, double vision, pain and redness.  Respiratory: Negative for cough, hemoptysis and shortness of breath.   Cardiovascular: Negative for chest pain, palpitations, orthopnea and leg swelling.  Gastrointestinal: Negative for abdominal pain, constipation, diarrhea, nausea and vomiting.  Genitourinary: Negative for dysuria, frequency and hematuria.  Musculoskeletal: Negative for back pain, joint pain and neck pain.  Skin:       Sacral wounds  Neurological: Negative for dizziness, tremors, focal weakness and weakness.  Endo/Heme/Allergies: Negative for polydipsia. Does not bruise/bleed easily.  Psychiatric/Behavioral: Negative for depression. The patient is not nervous/anxious and does not have insomnia.      VITAL SIGNS:   Vitals:   02/14/17 1924 02/14/17 1925  BP: 130/74   Pulse: (!) 132   Temp: (!) 101.3 F (38.5 C)   TempSrc: Oral   SpO2: 100%   Weight:  43.1 kg (95 lb)  Height:  5\' 9"  (1.753 m)   Wt Readings from Last 3 Encounters:  02/14/17 43.1 kg (95 lb)  11/21/16 47.5 kg (104 lb 12.8 oz)  04/15/16 42.7 kg (94 lb 2.2 oz)    PHYSICAL EXAMINATION:  Physical Exam  Vitals reviewed. Constitutional: He is oriented to person, place, and time. He appears well-developed and well-nourished. No distress.  HENT:  Head: Normocephalic and atraumatic.  Mouth/Throat: Oropharynx is clear and moist.  Eyes: Pupils are equal, round, and reactive to light. Conjunctivae and EOM are normal. No scleral icterus.  Neck: Normal range of motion. Neck supple. No JVD present. No thyromegaly present.  Cardiovascular: Normal rate, regular rhythm and intact distal pulses.  Exam reveals no gallop and no friction rub.   No murmur heard. Respiratory: Effort normal and breath sounds normal. No respiratory distress. He has  no wheezes. He has no rales.  GI: Soft. Bowel sounds are normal. He exhibits no distension. There is no tenderness.  Musculoskeletal: Normal range of motion. He exhibits no edema.  No arthritis, no gout  Lymphadenopathy:    He has no cervical adenopathy.  Neurological: He is alert and oriented to person, place, and time. No cranial nerve deficit.  No dysarthria, no aphasia  Skin: Skin is warm and dry. No rash noted. No erythema.  Sacral decubitus ulcers  Psychiatric: He has a normal mood and affect. His behavior is normal. Judgment and thought content normal.    LABORATORY PANEL:   CBC  Recent Labs Lab 02/14/17 1941  WBC 13.2*  HGB 9.8*  HCT 30.6*  PLT 684*   ------------------------------------------------------------------------------------------------------------------  Chemistries   Recent Labs Lab 02/14/17 1941  NA 137  K 4.0  CL 101  CO2 25  GLUCOSE 110*  BUN 11  CREATININE 0.60*  CALCIUM 8.7*  AST 21  ALT 29  ALKPHOS 134*  BILITOT 0.3   ------------------------------------------------------------------------------------------------------------------  Cardiac Enzymes  Recent Labs Lab 02/14/17 1941  TROPONINI <0.03   ------------------------------------------------------------------------------------------------------------------  RADIOLOGY:  Dg Chest 2 View  Result Date: 02/14/2017 CLINICAL DATA:  Fevers, chills, paraplegia EXAM: CHEST  2 VIEW COMPARISON:  11/19/2016 FINDINGS: There is no focal parenchymal opacity. There is no pleural effusion or pneumothorax. The heart and mediastinal contours are unremarkable. Posterior spinal fixation hardware transfixing the thoracolumbar spine. IMPRESSION: No active cardiopulmonary disease. Electronically Signed   By: Elige Ko   On: 02/14/2017 20:20    EKG:   Orders placed or performed during the hospital encounter of 02/14/17  . ED EKG 12-Lead  . ED EKG 12-Lead    IMPRESSION AND PLAN:  Principal  Problem:   Sepsis (HCC) - IV antibiotics, lactic acid mildly elevated, IV fluids and monitor until within normal limits, hemodynamically stable, cultures sent from the ED Active Problems:   Decubitus ulcer of sacral region - wound consult, CT ordered to look for osteomyelitis   Reported history of PE - patient reports this, but on chart review was unable to find any diagnostic workup or evidence of the same. We will get CTA chest to evaluate for this report of PE   Paraplegia (HCC) - continue home meds   Neurogenic bladder - home medications   Severe recurrent major depression without psychotic features (HCC) - home dose antidepressant  All the records are reviewed and case discussed with ED provider. Management plans discussed with the patient and/or family.  DVT PROPHYLAXIS: SubQ lovenox  GI PROPHYLAXIS: None  ADMISSION STATUS: Inpatient  CODE STATUS: Full Code Status History    Date Active Date Inactive Code Status Order ID Comments User Context   11/19/2016  5:20 PM 11/21/2016 10:07 PM Full Code [id number]  Auburn Bilberry, MD Inpatient   04/15/2016  6:02 PM 04/19/2016  2:11 AM Full Code [id number]  Shaune Pollack, MD Inpatient   04/29/2015 10:17 AM 04/30/2015  1:39 PM Full Code [id number]  Linus Galas, MD Inpatient   04/28/2015  4:38 AM 04/29/2015 10:17 AM Full Code [id number]  Milagros Loll, MD ED      TOTAL TIME TAKING CARE OF THIS PATIENT: 45 minutes.   Tea Collums FIELDING 02/14/2017, 9:58 PM  [employer]  [phone]  CC: Primary care physician; [facility]  Note:  This document was prepared using Dragon voice recognition software and may include unintentional dictation errors.

## 2017-02-14 NOTE — ED Notes (Signed)
Pt transport to 206 

## 2017-02-14 NOTE — Progress Notes (Signed)
Pharmacy Antibiotic Note  Jonathon York is a 41 y.o. male admitted on 02/14/2017 with sepsis.  Pharmacy has been consulted for vancomycin/Zosyn dosing.  Plan: Vancomycin 500 mg IV every 12 hours.  Goal trough 15-20 mcg/mL. Zosyn 3.375g IV q8h (4 hour infusion).  Trough before 4th dose of regimen.    Height: 5\' 9"  (175.3 cm) Weight: 95 lb (43.1 kg) IBW/kg (Calculated) : 70.7  Temp (24hrs), Avg:101.3 F (38.5 C), Min:101.3 F (38.5 C), Max:101.3 F (38.5 C)   Recent Labs Lab 02/14/17 1941  WBC 13.2*  CREATININE 0.60*  LATICACIDVEN 2.1*    Estimated Creatinine Clearance: 74.1 mL/min (A) (by C-G formula based on SCr of 0.6 mg/dL (L)).    Allergies  Allergen Reactions  . Orange Fruit [Citrus] Other (See Comments)    blisters    Antimicrobials this admission: Vancomycin/Zosyn 10/19 >>  Dose adjustments this admission:   Microbiology results: 10/19  BCx: sent 10/19  UCx: sent    Thank you for allowing pharmacy to be a part of this patient's care.  Marty HeckWang, Burnette Valenti L 02/14/2017 10:32 PM

## 2017-02-14 NOTE — ED Provider Notes (Signed)
[facility] Emergency Department Provider Note  ____________________________________________   First MD Initiated Contact with Patient 02/14/17 2048     (approximate)  I have reviewed the triage vital signs and the nursing notes.   HISTORY  Chief Complaint Wound Infection   HPI Jonathon York is a 41 y.o. male self presents to theemergency department with 3 days of progressive fever and increasing discharge from his buttock wounds. 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 Over the past 12 months he has had 2 episodes of osteomyelitis most recently several months ago after which she completed a 6 week course of IV antibiotics. He was back at his baseline until about 3 days ago. One week ago he self discontinued his Foley catheter because he was tired of it and is been using condom catheters ever since although he has noted a slowly progressive rash to his penis ever since. He does report fevers and chills. He does report mild to moderate diffuse body aches and pain. Nothing seems to make his symptoms better or worse.   Past Medical History:  Diagnosis Date  . Anxiety   . Decubitus ulcer   . Depression   . Neurogenic bladder   . Osteomyelitis (HCC)   . 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 Protein-calorie malnutrition, severe 11/20/2016  . Herpes simplex infection of penis   . Decubitus ulcer 11/19/2016  . 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 Decubitus ulcer of sacral region   . Sepsis (HCC) 04/15/2016  . 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 Pressure ulcer stage III 04/30/2015  . Sterile pyuria 04/30/2015  . Paraplegia (HCC) 04/30/2015  . Neurogenic bladder 04/30/2015  . 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  Malnutrition of moderate degree 04/28/2015    Past Surgical History:  Procedure Laterality Date  . AMPUTATION TOE Right 04/29/2015   Procedure: AMPUTATION TOE;  Surgeon: Linus Galasodd Cline, MD;  Location: [facility] ORS;  Service: Podiatry;  Laterality: Right;  . IRRIGATION AND DEBRIDEMENT BUTTOCKS    . TOE AMPUTATION Right     Prior to Admission medications   Medication Sig Start Date End Date Taking? Authorizing Provider  Amino Acids-Protein Hydrolys (FEEDING SUPPLEMENT, PRO-STAT SUGAR FREE 64,) LIQD Take 30 mLs by mouth 2 (two) times daily. 11/21/16  Yes Altamese DillingVachhani, Vaibhavkumar, MD  feeding supplement, ENSURE ENLIVE, (ENSURE ENLIVE) LIQD Take 237 mLs by mouth 2 (two) times daily between meals. 11/21/16  Yes Altamese DillingVachhani, Vaibhavkumar, MD  ibuprofen (ADVIL,MOTRIN) 200 MG tablet Take 400 mg by mouth every 6 (six) hours as needed for mild pain.   Yes [provider]  Multiple Vitamin (MULTIVITAMIN WITH MINERALS) TABS tablet Take 1 tablet by mouth daily. 11/22/16  Yes Altamese DillingVachhani, Vaibhavkumar, MD  nicotine (NICODERM CQ - DOSED IN MG/24 HR) 7 mg/24hr patch Place 1 patch (7 mg total) onto the skin daily. 11/22/16  Yes Altamese DillingVachhani, Vaibhavkumar, MD  oxybutynin (DITROPAN) 5 MG tablet Take 15 mg by mouth 2 (two) times daily.   Yes [provider]    Allergies Orange fruit [citrus]  Family History  Problem Relation Age of Onset  . Lymphoma Sister     Social History Social History  Substance Use Topics  . Smoking status: Current Some Day Smoker  Packs/day: 0.50    Types: Cigarettes  . Smokeless tobacco: Never Used  . Alcohol use No    Review of Systems Constitutional: positive for Eyes: No visual changes. ENT: No sore throat. Cardiovascular: Denies chest pain. Respiratory: Denies shortness of breath. Gastrointestinal: No abdominal pain.  No nausea, no vomiting.  No diarrhea.  No constipation. Genitourinary: Negative for dysuria. Musculoskeletal: Negative for back pain. Skin:positive  for rash and wound. Neurological: Negative for headaches, focal weakness or numbness.   ____________________________________________   PHYSICAL EXAM:  VITAL SIGNS: ED Triage Vitals  Enc Vitals Group     BP 02/14/17 1924 130/74     Pulse Rate 02/14/17 1924 (!) 132     Resp --      Temp 02/14/17 1924 (!) 101.3 F (38.5 C)     Temp Source 02/14/17 1924 Oral     SpO2 02/14/17 1924 100 %     Weight 02/14/17 1925 95 lb (43.1 kg)     Height 02/14/17 1925 5\' 9"  (1.753 m)     Head Circumference --      Peak Flow --      Pain Score 02/14/17 1924 8     Pain Loc --      Pain Edu? --      Excl. in GC? --     Constitutional: alert and oriented 4 mild diaphoresis positive cooperative speaks in full clear sentences Eyes: PERRL EOMI. Head: Atraumatic. Nose: No congestion/rhinnorhea. Mouth/Throat: No trismus Neck: No stridor.   Cardiovascular: tachycardicrate, regular rhythm. Grossly normal heart sounds.  Good peripheral circulation. Respiratory: Normal respiratory effort.  No retractions. Lungs CTAB and moving good air Gastrointestinal: soft nontender his phallus has no discharge but does have a slightly erythematous rash on the left side of the shaft Musculoskeletal: No lower extremity edema   Neurologic:  Normal speech and language. T10 sensory level and motor level Skin:  3 large decubitus ulcers noted all with foul smelling discharge Psychiatric: Mood and affect are normal. Speech and behavior are normal.    ____________________________________________   DIFFERENTIAL includes but not limited to  osteomyelitis, urinary tract infection, sepsis, metabolic drainage ____________________________________________   LABS (all labs ordered are listed, but only abnormal results are displayed)  Labs Reviewed  LACTIC ACID, PLASMA - Abnormal; Notable for the following:       Result Value   Lactic Acid, Venous 2.1 (*)    All other components within normal limits  COMPREHENSIVE  METABOLIC PANEL - Abnormal; Notable for the following:    Glucose, Bld 110 (*)    Creatinine, Ser 0.60 (*)    Calcium 8.7 (*)    Albumin 2.5 (*)    Alkaline Phosphatase 134 (*)    All other components within normal limits  CBC WITH DIFFERENTIAL/PLATELET - Abnormal; Notable for the following:    WBC 13.2 (*)    RBC 4.11 (*)    Hemoglobin 9.8 (*)    HCT 30.6 (*)    MCV 74.4 (*)    MCH 23.9 (*)    RDW 20.9 (*)    Platelets 684 (*)    Neutro Abs 11.3 (*)    All other components within normal limits  URINALYSIS, COMPLETE (UACMP) WITH MICROSCOPIC - Abnormal; Notable for the following:    Color, Urine YELLOW (*)    APPearance HAZY (*)    Hgb urine dipstick SMALL (*)    Nitrite POSITIVE (*)    Leukocytes, UA TRACE (*)    Bacteria, UA FEW (*)  Squamous Epithelial / LPF 0-5 (*)    All other components within normal limits  CULTURE, BLOOD (ROUTINE X 2)  CULTURE, BLOOD (ROUTINE X 2)  URINE CULTURE  MRSA PCR SCREENING  LACTIC ACID, PLASMA  TROPONIN I  BLOOD GAS, VENOUS  BASIC METABOLIC PANEL  CBC    blood work reviewed and interpreted by me shows an elevated white count and an elevated lactic acid which are both concerning for active bacterial infection urinalysis is consistent with infection __________________________________________  EKG  ED ECG REPORT I, Merrily Brittle, the attending physician, personally viewed and interpreted this ECG.  Date: 02/14/2017 EKG Time:  Rate: 135 Rhythm: sinus tachycardia QRS Axis: normal Intervals: normal ST/T Wave abnormalities: normal Narrative Interpretation: no evidence of acute ischemia  ____________________________________________  RADIOLOGY  chest x-ray reviewed by me shows no acute disease ____________________________________________   PROCEDURES  Procedure(s) performed: no  Procedures  Critical Care performed: yes  CRITICAL CARE Performed by: Merrily Brittle   Total critical care time: 32 minutes  Critical  care time was exclusive of separately billable procedures and treating other patients.  Critical care was necessary to treat or prevent imminent or life-threatening deterioration.  Critical care was time spent personally by me on the following activities: development of treatment plan with patient and/or surrogate as well as nursing, discussions with consultants, evaluation of patient's response to treatment, examination of patient, obtaining history from patient or surrogate, ordering and performing treatments and interventions, ordering and review of laboratory studies, ordering and review of radiographic studies, pulse oximetry and re-evaluation of patient's condition.   Observation: no ____________________________________________   INITIAL IMPRESSION / ASSESSMENT AND PLAN / ED COURSE  Pertinent labs & imaging results that were available during my care of the patient were reviewed by me and considered in my medical decision making (see chart for details).  on arrival the patient is febrile tachycardic with an elevated white count and an elevated lactic acid all with decubitus ulcers with worsening discharge. His constellation of symptoms is most consistent with sepsis. He was given broad-spectrum antibiotics as well as IV fluids and he requires inpatient admission for continued intravenous antibiotics and IV fluid resuscitation. The patient verbalizes understanding and agreement with the plan.      ____________________________________________   FINAL CLINICAL IMPRESSION(S) / ED DIAGNOSES  Final diagnoses:  Sepsis, due to unspecified organism [facility])      NEW MEDICATIONS STARTED DURING THIS VISIT:  Current Discharge Medication List       Note:  This document was prepared using Dragon voice recognition software and may include unintentional dictation errors.     Merrily Brittle, MD 02/14/17 2336

## 2017-02-14 NOTE — ED Triage Notes (Signed)
Pt to ED via POV, he reports that he has infected wounds, [facility] He reports that they did not send him home with any home health care or medications. He has had a fever and chills, his heart rate is elevated. Pt has odor from wounds. He is unable to take care of his wounds. He is wheelchair bound.

## 2017-02-15 ENCOUNTER — Inpatient Hospital Stay: Payer: Medicare Other

## 2017-02-15 DIAGNOSIS — A408 Other streptococcal sepsis: Secondary | ICD-10-CM

## 2017-02-15 LAB — CBC
HCT: 25.9 % — ABNORMAL LOW (ref 40.0–52.0)
Hemoglobin: 8.2 g/dL — ABNORMAL LOW (ref 13.0–18.0)
MCH: 23.8 pg — ABNORMAL LOW (ref 26.0–34.0)
MCHC: 31.8 g/dL — ABNORMAL LOW (ref 32.0–36.0)
MCV: 74.8 fL — ABNORMAL LOW (ref 80.0–100.0)
Platelets: 491 10*3/uL — ABNORMAL HIGH (ref 150–440)
RBC: 3.47 MIL/uL — ABNORMAL LOW (ref 4.40–5.90)
RDW: 20.4 % — ABNORMAL HIGH (ref 11.5–14.5)
WBC: 8.2 10*3/uL (ref 3.8–10.6)

## 2017-02-15 LAB — BASIC METABOLIC PANEL
Anion gap: 5 (ref 5–15)
BUN: 9 mg/dL (ref 6–20)
CO2: 25 mmol/L (ref 22–32)
Calcium: 7.7 mg/dL — ABNORMAL LOW (ref 8.9–10.3)
Chloride: 109 mmol/L (ref 101–111)
Creatinine, Ser: 0.43 mg/dL — ABNORMAL LOW (ref 0.61–1.24)
GFR calc Af Amer: >60 mL/min (ref >60)
GFR calc non Af Amer: >60 mL/min (ref >60)
Glucose, Bld: 121 mg/dL — ABNORMAL HIGH (ref 65–99)
Potassium: 3.3 mmol/L — ABNORMAL LOW (ref 3.5–5.1)
Sodium: 139 mmol/L (ref 135–145)

## 2017-02-15 LAB — PROTIME-INR
INR: 1.27
Prothrombin Time: 15.8 seconds — ABNORMAL HIGH (ref 11.4–15.2)

## 2017-02-15 LAB — MRSA PCR SCREENING: MRSA by PCR: POSITIVE — AB

## 2017-02-15 LAB — APTT: APTT: 38 s — AB (ref 24–36)

## 2017-02-15 MED ORDER — PROMETHAZINE HCL 25 MG PO TABS
12.5000 mg | ORAL_TABLET | Freq: Four times a day (QID) | ORAL | Status: DC | PRN
  Administered 2017-02-18: 12.5 mg via ORAL
  Filled 2017-02-15 (×3): qty 1

## 2017-02-15 MED ORDER — DAKINS (1/4 STRENGTH) 0.125 % EX SOLN
Freq: Every day | CUTANEOUS | Status: AC
  Administered 2017-02-15 – 2017-02-16 (×2)
  Filled 2017-02-15: qty 473

## 2017-02-15 MED ORDER — IOPAMIDOL (ISOVUE-370) INJECTION 76%
100.0000 mL | Freq: Once | INTRAVENOUS | Status: AC | PRN
  Administered 2017-02-15: 100 mL via INTRAVENOUS

## 2017-02-15 MED ORDER — CHLORHEXIDINE GLUCONATE CLOTH 2 % EX PADS
6.0000 | MEDICATED_PAD | Freq: Every day | CUTANEOUS | Status: DC
  Administered 2017-02-15 – 2017-02-19 (×4): 6 via TOPICAL

## 2017-02-15 MED ORDER — INFLUENZA VAC SPLIT QUAD 0.5 ML IM SUSY
0.5000 mL | PREFILLED_SYRINGE | INTRAMUSCULAR | Status: DC
Start: 2017-02-16 — End: 2017-02-19
  Filled 2017-02-15: qty 0.5

## 2017-02-15 MED ORDER — MUPIROCIN 2 % EX OINT
1.0000 "application " | TOPICAL_OINTMENT | Freq: Two times a day (BID) | CUTANEOUS | Status: DC
  Administered 2017-02-15 – 2017-02-19 (×9): 1 via NASAL
  Filled 2017-02-15 (×2): qty 22

## 2017-02-15 MED ORDER — IOPAMIDOL (ISOVUE-300) INJECTION 61%: 30.0000 mL | Freq: Once | INTRAVENOUS | Status: DC | PRN

## 2017-02-15 MED ORDER — VANCOMYCIN HCL 500 MG IV SOLR
500.0000 mg | Freq: Two times a day (BID) | INTRAVENOUS | Status: DC
  Administered 2017-02-16 – 2017-02-17 (×3): 500 mg via INTRAVENOUS
  Filled 2017-02-15 (×4): qty 500

## 2017-02-15 NOTE — Consult Note (Signed)
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  Patient reports that he uses salt and vinegar solution for self care at home. Wound type:Pressure Pressure Ulcer POA: Yes Measurement:  Sacral:  9cm x 12cm x 0.6cm with dark purple/maroon area in center over coccyx measuring 2cm x 3cm. Pale pink wound bed with large amount of thick light yellow exudate, musty odor. Left ischial tuberosity:  5cm x 5cm x 0.4cm withy pale pink wound bed and moderate amount of thick light yellow exudate, urine odor detected. Wound borders on but not not connect to sacral wound. Closed wound edges from 5-9 o'clock (epibole). Wound bed: As described above Drainage (amount, consistency, odor) As described above Periwound: Intact, macerated (foam dressing in place until my assessment could be made this morning) Dressing procedure/placement/frequency: I will provide a mattress replacement with low air loss feature for patient today; he has a pressure redistribution cushion of high quality for his wheelchair that this is where I believe he spends the majority of his time. Patient is reminded that the sitting position is the causative position for ischial tuberosity pressure injuries.  Patient indicates understanding. I will provide Nursing with guidance via the Orders for three days of twice daily dressing changes with gauze moistened with sodium hypochlorite (1/4 strength Dakin's solution) to regain control of the bioburden that appears to be present.  Will consider NS moist dressings after this if wound is cleaned of devitlalized tissue.  WOC team will follow and re-evaluate in 3 days.  Maple HudsonKaren Smaran Gaus RN BSN CWON Pager [phone]

## 2017-02-15 NOTE — Progress Notes (Signed)
[facility] [place] [facility]   PATIENT NAME: Jonathon York    MR#:  [id number]  DATE OF BIRTH:  1976/04/14  SUBJECTIVE:  Patient presented with foul-smelling discharge from his decubitus. He has some nausea.  REVIEW OF SYSTEMS:   Review of Systems  Constitutional: Positive for weight loss. Negative for chills and fever.  HENT: Negative for ear discharge, ear pain and nosebleeds.   Eyes: Negative for blurred vision, pain and discharge.  Respiratory: Negative for sputum production, shortness of breath, wheezing and stridor.   Cardiovascular: Negative for chest pain, palpitations, orthopnea and PND.  Gastrointestinal: Positive for nausea. Negative for abdominal pain, diarrhea and vomiting.  Genitourinary: Negative for frequency and urgency.  Musculoskeletal: Negative for back pain and joint pain.  Skin:       Chronic stage IV ulcers  Neurological: Negative for sensory change, speech change, focal weakness and weakness.  Psychiatric/Behavioral: Negative for depression and hallucinations. The patient is not nervous/anxious.    Tolerating Diet: Some Tolerating PT: Bedbound secondary to paraplegia  DRUG ALLERGIES:   Allergies  Allergen Reactions  . Orange Fruit [Citrus] Other (See Comments)    blisters    VITALS:  Blood pressure (!) 99/54, pulse 87, temperature 98.5 F (36.9 C), temperature source Oral, resp. rate 16, height 5\' 9"  (1.753 m), weight 43.1 kg (95 lb), SpO2 100 %.  PHYSICAL EXAMINATION:   Physical Exam  GENERAL:  41 y.o.-year-old patient lying in the bed with no acute distress.  EYES: Pupils equal, round, reactive to light and accommodation. No scleral icterus. Extraocular muscles intact.  HEENT: Head atraumatic, normocephalic. Oropharynx and nasopharynx clear.  NECK:  Supple, no jugular venous distention. No thyroid enlargement, no tenderness.  LUNGS: Normal breath sounds bilaterally, no wheezing, rales, rhonchi. No use of  accessory muscles of respiration.  CARDIOVASCULAR: S1, S2 normal. No murmurs, rubs, or gallops.  ABDOMEN: Soft, nontender, nondistended. Bowel sounds present. No organomegaly or mass.  EXTREMITIES: No cyanosis, clubbing or edema b/l.    NEUROLOGIC: Functional paraplegia PSYCHIATRIC:  patient is alert and oriented x 3.  SKIN: Stage IV decubitus chronic  LABORATORY PANEL:  CBC  Recent Labs Lab 02/15/17 0328  WBC 8.2  HGB 8.2*  HCT 25.9*  PLT 491*    Chemistries   Recent Labs Lab 02/14/17 1941 02/15/17 0328  NA 137 139  K 4.0 3.3*  CL 101 109  CO2 25 25  GLUCOSE 110* 121*  BUN 11 9  CREATININE 0.60* 0.43*  CALCIUM 8.7* 7.7*  AST 21  --   ALT 29  --   ALKPHOS 134*  --   BILITOT 0.3  --    Cardiac Enzymes  Recent Labs Lab 02/14/17 1941  TROPONINI <0.03   RADIOLOGY:  Dg Chest 2 View  Result Date: 02/14/2017 CLINICAL DATA:  Fevers, chills, paraplegia EXAM: CHEST  2 VIEW COMPARISON:  11/19/2016 FINDINGS: There is no focal parenchymal opacity. There is no pleural effusion or pneumothorax. The heart and mediastinal contours are unremarkable. Posterior spinal fixation hardware transfixing the thoracolumbar spine. IMPRESSION: No active cardiopulmonary disease. Electronically Signed   By: Elige Ko   On: 02/14/2017 20:20   Ct Angio Chest Pe W Or Wo Contrast  Result Date: 02/15/2017 CLINICAL DATA:  41 y/o M; malaise, foul smell from decubitus ulcers, sepsis. History of pulmonary embolus. EXAM: CT ANGIOGRAPHY CHEST CT ABDOMEN AND PELVIS WITH CONTRAST TECHNIQUE: Multidetector CT imaging of the chest was performed using the standard protocol during bolus administration  of intravenous contrast. Multiplanar CT image reconstructions and MIPs were obtained to evaluate the vascular anatomy. Multidetector CT imaging of the abdomen and pelvis was performed using the standard protocol during bolus administration of intravenous contrast. CONTRAST:  100 cc Isovue 370 COMPARISON:  None.  FINDINGS: CTA CHEST FINDINGS Cardiovascular: Linear membrane within the right lower lobe pulmonary artery is compatible with chronic pulmonary embolus (series 5, image 144). No rounded central filling defect to suggest acute pulmonary embolus. No CT findings of right heart strain. Normal caliber main pulmonary artery and thoracic aorta. Normal heart size. No pericardial effusion. Pectus excavatum. Mediastinum/Nodes: No enlarged mediastinal, hilar, or axillary lymph nodes. Thyroid gland, trachea, and esophagus demonstrate no significant findings. Lungs/Pleura: Lungs are clear. No pleural effusion or pneumothorax. Musculoskeletal: T7- L1 spinal hardware with pedicle screws at the T12 and L1 level and laminar hooks at the T7 through T11 levels. Chronic fracture deformity of the T11 vertebral body with posterior retropulsion of superior endplate and bilateral laminar hooks which are centrally located within the canal resulting in multifactorial severe canal stenosis. No acute osseous abnormality identified. Review of the MIP images confirms the above findings. CT ABDOMEN and PELVIS FINDINGS Hepatobiliary: Segment 7 subcentimeter lucency compatible with cysts. No other focal liver lesion identified. Normal gallbladder. No intra or extrahepatic biliary ductal dilatation. Pancreas: Unremarkable. No pancreatic ductal dilatation or surrounding inflammatory changes. Spleen: Spleen measures 5.2 x 9.5 x 17.3 cm (volume = 450 cm^3). Adrenals/Urinary Tract: Normal adrenal glands. Punctate right kidney lower pole nonobstructing stone. Left kidney subcentimeter lucencies compatible with cysts. No hydronephrosis. Foley catheter within the bladder which demonstrates mild wall thickening compatible with neurogenic bladder. Stomach/Bowel: Stomach is within normal limits. Appendix appears normal. No evidence of bowel wall thickening, distention, or inflammatory changes. Vascular/Lymphatic: Aortic atherosclerosis. No enlarged abdominal  or pelvic lymph nodes. Reproductive: Prostate is unremarkable. Other: No ascites or abdominal wall hernia peer Musculoskeletal: Extensive decubitus ulcers with open exposure of the right ischium. Periosteal reaction of right ischium, near complete erosion of left inferior pubic ramus, and deformity and partial erosion of the coccyx compatible sequelae osteomyelitis. Multiple erosion demonstrated sclerotic margins compatible with chronic osteomyelitis. Defect in right ischium cortex with lucent margin likely representing acute osteomyelitis (series 11, image 88). Extensive edema and enhancement of the right greater than left gluteus and right posterior upper thigh musculature. Addition, there is diffuse dermal thickening of the upper thighs and gluteal region with subcutaneous fat stranding compatible with cellulitis. No discrete rim enhancing collection is identified. Review of the MIP images confirms the above findings. IMPRESSION: CTA chest: 1. 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 No acute pulmonary embolus identified. 2. No acute pulmonary process. CT abdomen and pelvis: 1. Extensive decubitus ulcer.  Open exposure of right ischium. 2. Periosteal reaction of right ischium, near complete erosion of left inferior pubic ramus, and deformity/partial erosion of the coccyx is compatible sequelae of osteomyelitis. 3. Multiple bony erosions demonstrated sclerotic margins compatible with chronic osteomyelitis. Defect in right posterior ischium cortex with lucent margin likely representing acute osteomyelitis. 4. Diffuse inflammatory changes of dermis, subcutaneous fat, gluteal muscles, and upper thigh muscles, greater on the right likely related to infection/ inflammation. No rim enhancing abscess identified. 5. Right kidney lower pole nonobstructing nephrolithiasis. 6. Mild splenomegaly. Electronically Signed   By: Mitzi HansenLance  Furusawa-Stratton M.D.   On: 02/15/2017 05:46   Ct Abdomen Pelvis W  Contrast  Result Date: 02/15/2017 CLINICAL DATA:  41 y/o M; malaise, foul smell from decubitus  ulcers, sepsis. History of pulmonary embolus. EXAM: CT ANGIOGRAPHY CHEST CT ABDOMEN AND PELVIS WITH CONTRAST TECHNIQUE: Multidetector CT imaging of the chest was performed using the standard protocol during bolus administration of intravenous contrast. Multiplanar CT image reconstructions and MIPs were obtained to evaluate the vascular anatomy. Multidetector CT imaging of the abdomen and pelvis was performed using the standard protocol during bolus administration of intravenous contrast. CONTRAST:  100 cc Isovue 370 COMPARISON:  None. FINDINGS: CTA CHEST FINDINGS Cardiovascular: Linear membrane within the right lower lobe pulmonary artery is compatible with chronic pulmonary embolus (series 5, image 144). No rounded central filling defect to suggest acute pulmonary embolus. No CT findings of right heart strain. Normal caliber main pulmonary artery and thoracic aorta. Normal heart size. No pericardial effusion. Pectus excavatum. Mediastinum/Nodes: No enlarged mediastinal, hilar, or axillary lymph nodes. Thyroid gland, trachea, and esophagus demonstrate no significant findings. Lungs/Pleura: Lungs are clear. No pleural effusion or pneumothorax. Musculoskeletal: T7- L1 spinal hardware with pedicle screws at the T12 and L1 level and laminar hooks at the T7 through T11 levels. Chronic fracture deformity of the T11 vertebral body with posterior retropulsion of superior endplate and bilateral laminar hooks which are centrally located within the canal resulting in multifactorial severe canal stenosis. No acute osseous abnormality identified. Review of the MIP images confirms the above findings. CT ABDOMEN and PELVIS FINDINGS Hepatobiliary: Segment 7 subcentimeter lucency compatible with cysts. No other focal liver lesion identified. Normal gallbladder. No intra or extrahepatic biliary ductal dilatation. Pancreas: Unremarkable.  No pancreatic ductal dilatation or surrounding inflammatory changes. Spleen: Spleen measures 5.2 x 9.5 x 17.3 cm (volume = 450 cm^3). Adrenals/Urinary Tract: Normal adrenal glands. Punctate right kidney lower pole nonobstructing stone. Left kidney subcentimeter lucencies compatible with cysts. No hydronephrosis. Foley catheter within the bladder which demonstrates mild wall thickening compatible with neurogenic bladder. Stomach/Bowel: Stomach is within normal limits. Appendix appears normal. No evidence of bowel wall thickening, distention, or inflammatory changes. Vascular/Lymphatic: Aortic atherosclerosis. No enlarged abdominal or pelvic lymph nodes. Reproductive: Prostate is unremarkable. Other: No ascites or abdominal wall hernia peer Musculoskeletal: Extensive decubitus ulcers with open exposure of the right ischium. Periosteal reaction of right ischium, near complete erosion of left inferior pubic ramus, and deformity and partial erosion of the coccyx compatible sequelae osteomyelitis. Multiple erosion demonstrated sclerotic margins compatible with chronic osteomyelitis. Defect in right ischium cortex with lucent margin likely representing acute osteomyelitis (series 11, image 88). Extensive edema and enhancement of the right greater than left gluteus and right posterior upper thigh musculature. Addition, there is diffuse dermal thickening of the upper thighs and gluteal region with subcutaneous fat stranding compatible with cellulitis. No discrete rim enhancing collection is identified. Review of the MIP images confirms the above findings. IMPRESSION: CTA chest: 1. 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 No acute pulmonary embolus identified. 2. No acute pulmonary process. CT abdomen and pelvis: 1. Extensive decubitus ulcer.  Open exposure of right ischium. 2. Periosteal reaction of right ischium, near complete erosion of left inferior pubic ramus, and deformity/partial erosion of the  coccyx is compatible sequelae of osteomyelitis. 3. Multiple bony erosions demonstrated sclerotic margins compatible with chronic osteomyelitis. Defect in right posterior ischium cortex with lucent margin likely representing acute osteomyelitis. 4. Diffuse inflammatory changes of dermis, subcutaneous fat, gluteal muscles, and upper thigh muscles, greater on the right likely related to infection/ inflammation. No rim enhancing abscess identified. 5. Right kidney lower pole nonobstructing nephrolithiasis. 6. Mild splenomegaly. Electronically Signed   By:  Mitzi HansenLance  Furusawa-Stratton M.D.   On: 02/15/2017 05:46   ASSESSMENT AND PLAN:   Marshia LyRichard Jaquay  is a 41 y.o. male who presents with complaint of malaise and foul smell from his decubitus ulcers. He came in to the ED and was found to meet sepsis criteria with tachycardia, fever, leukocytosis  1. Sepsis due to infected decubitus ulcer Pt has had debridement by surgery- 11/19/16 and [facility] Wound care consult Treat with broad-spectrum antibiotics with IV vancomycin and Zosyn -CT shows OM and severe wounds with chornic changes -Surgery and ID consult  2. Nausea Prn zofran -prn phenergan  3. Bladder dysfunction -has foley cath +  4. Failure to thrive With severe protein calorie malnutrition  -Dietitian to see  5. Misc: lovenox for dvt proph  6. 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 Management plans discussed with the patient, family and they are in agreement.  CODE STATUS: full  DVT Prophylaxis: lovenox  TOTAL TIME TAKING CARE OF THIS PATIENT: *30* minutes.  >50% time spent on counselling and coordination of care  POSSIBLE D/C IN few DAYS, DEPENDING ON CLINICAL CONDITION.  Note: This dictation was prepared with Dragon dictation along with smaller phrase technology. Any transcriptional errors that result from this process are  unintentional.  Pheonix Clinkscale M.D on 02/15/2017 at 1:56 PM  Between 7am to 6pm - Pager - [phone]  After 6pm go to [email or web address] - Social research officer, governmentpassword EPAS [facility]  [facility] [place] [facility]  Office  [phone]  CC: Primary care physician; Dan HumphreysMebane, [facility] CarePatient ID: Winfred Leedsichard L Blakesley, male   DOB: 1975-12-06, 41 y.o.   MRN: [id number]

## 2017-02-15 NOTE — Consult Note (Signed)
Patient ID: Jonathon York, male   DOB: [birth year], 41 y.o.   MRN: [id number]  HPI Jonathon York is a 41 y.o. male asked to see in consultation for decubitus ulcers.  He is paraplegic for the past 17 years.  He did have a Debridement by [facility] 2 months ago. They did wound vac before with no success.  Apparently he did have fevers. Admitted for IV a/bs , he does have hx chronic osteo. He also had a hx of PE but not anticoagulated now. Ct scan personally reviewed , evidence of multiple decubitus w chronic bone involvement. No necrotizing infection. WBC 13.  Hb 8. HE reports chronic wound for the last few months.  does not smoke      HPI  Past Medical History:  Diagnosis Date  . Anxiety   . Decubitus ulcer   . Depression   . Neurogenic bladder   . Osteomyelitis (HCC)   . 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 IRRIGATION AND DEBRIDEMENT BUTTOCKS    . TOE AMPUTATION Right     Family History  Problem Relation Age of Onset  . Lymphoma Sister     Social History Social History  Substance Use Topics  . Smoking status: Current Some Day Smoker    Packs/day: 0.50    Types: Cigarettes  . Smokeless tobacco: Never Used  . Alcohol use No    Allergies  Allergen Reactions  . Orange Fruit [Citrus] Other (See Comments)    blisters    Current Facility-Administered Medications  Medication Dose Route Frequency Provider Last Rate Last Dose  . 0.9 %  sodium chloride infusion   Intravenous Continuous Oralia Manis, MD 75 mL/hr at 02/15/17 1333    . acetaminophen (TYLENOL) tablet 650 mg  650 mg Oral Q6H PRN Oralia Manis, MD       Or  . acetaminophen (TYLENOL) suppository 650 mg  650 mg Rectal Q6H PRN Oralia Manis, MD      . Chlorhexidine Gluconate Cloth 2  % PADS 6 each  6 each Topical Q0600 Oralia Manis, MD   6 each at 02/15/17 [phone]  . enoxaparin (LOVENOX) injection 40 mg  40 mg Subcutaneous Q24H Oralia Manis, MD   40 mg at 02/15/17 0033  . HYDROcodone-acetaminophen (NORCO/VICODIN) 5-325 MG per tablet 1-2 tablet  1-2 tablet Oral Q4H PRN Oralia Manis, MD   1 tablet at 02/15/17 1741  . [START ON 02/16/2017] Influenza vac split quadrivalent PF (FLUARIX) injection 0.5 mL  0.5 mL Intramuscular Tomorrow-1000 Oralia Manis, MD      . iopamidol (ISOVUE-300) 61 % injection 30 mL  30 mL Oral Once PRN Oralia Manis, MD      . megestrol (MEGACE) 40 MG/ML suspension 800 mg  800 mg Oral Daily Oralia Manis, MD      . mupirocin ointment (BACTROBAN) 2 % 1 application  1 application Nasal BID Oralia Manis, MD   1 application at 02/15/17 [phone]  . nicotine (NICODERM CQ - dosed in mg/24 hr) patch 7 mg  7 mg Transdermal Daily Oralia Manis, MD   7 mg at 02/15/17 1224  . ondansetron (ZOFRAN) tablet 4 mg  4 mg Oral Q6H PRN Oralia Manis, MD       Or  . ondansetron [facility]) injection 4  mg  4 mg Intravenous Q6H PRN Oralia ManisWillis, David, MD   4 mg at 02/15/17 1306  . oxybutynin (DITROPAN) tablet 15 mg  15 mg Oral BID Oralia ManisWillis, David, MD   15 mg at 02/15/17 0033  . piperacillin-tazobactam (ZOSYN) IVPB 3.375 g  3.375 g Intravenous Willow OraQ8H Willis, David, MD   Stopped at 02/15/17 1734  . promethazine (PHENERGAN) tablet 12.5 mg  12.5 mg Oral Q6H PRN Enedina FinnerPatel, Sona, MD      . sodium hypochlorite (DAKIN'S 1/4 STRENGTH) topical solution   Irrigation Daily Enedina FinnerPatel, Sona, MD      . Melene Muller[START ON 02/16/2017] vancomycin (VANCOCIN) 500 mg in sodium chloride 0.9 % 100 mL IVPB  500 mg Intravenous Q12H Enedina FinnerPatel, Sona, MD         Review of Systems Full ROS  was asked and was negative except for the information on the HPI  Physical Exam Blood pressure (!) 98/50, pulse 85, temperature 98.5 F (36.9 C), temperature source Oral, resp. rate 16, height 5\' 9"  (1.753 m), weight 43.1 kg (95 lb), SpO2 100  %. CONSTITUTIONAL: NAD malnourished EYES: Pupils are equal, round, and reactive to light, Sclera are non-icteric. EARS, NOSE, MOUTH AND THROAT: The oropharynx is clear. The oral mucosa is pink and moist. Hearing is intact to voice. LYMPH NODES:  Lymph nodes in the neck are normal. RESPIRATORY:  Lungs are clear. There is normal respiratory effort, with equal breath sounds bilaterally, and without pathologic use of accessory muscles. CARDIOVASCULAR: Heart is regular without murmurs, gallops, or rubs. GI: The abdomen is  soft, nontender, and nondistended. There are no palpable masses. There is no hepatosplenomegaly. There are normal bowel sounds in all quadrants. Skin: sacral decubitus 9x 12 cms good granulation tissue, coccyx seen.  Left Ischial ulcer 4x 5 cm good base, no undrained collection or tissue necrosis Right ischial ulcer w significant tracking and some fibrinous material at the base. NO abscess or tissue necrosis  GU: Rectal deferred.   MUSCULOSKELETAL: Normal muscle strength and tone. No cyanosis or edema.   SKIN:   PSYCH:  Oriented to person, place and time. Affect is normal.  Data Reviewed  I have personally reviewed the patient's imaging, laboratory findings and medical records.    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 Currently no evidence significant tissue necrosis or abscess. No need for emergent debridement required at this time. We will recommend continuation of wound care relieving pressure points every 2 hours and optimization of nutritional status.I agree with ID consult for his chronic of cellulitis.   Main issue will remain coverage for this complex wounds that all these they will be better served by a Engineer, petroleumplastic surgeon. If any surgical needs arise we will be available.    Sterling Bigiego Pabon, MD FACS General Surgeon 02/15/2017, 6:57 PM

## 2017-02-15 NOTE — Progress Notes (Signed)
[facility] responded to request for Advance Directive Education. Patient was with medical staff at the time. [facility] will leave follow-up note [facility]    02/15/17 1335  Clinical Encounter Type  Visited With Patient not available;Health care provider  Visit Type Initial;Spiritual support  Referral From Nurse

## 2017-02-16 MED ORDER — ENSURE ENLIVE PO LIQD
237.0000 mL | Freq: Two times a day (BID) | ORAL | Status: DC
  Administered 2017-02-16 – 2017-02-19 (×4): 237 mL via ORAL

## 2017-02-16 MED ORDER — ADULT MULTIVITAMIN W/MINERALS CH
1.0000 | ORAL_TABLET | Freq: Every day | ORAL | Status: DC
  Administered 2017-02-16 – 2017-02-19 (×4): 1 via ORAL
  Filled 2017-02-16 (×4): qty 1

## 2017-02-16 MED ORDER — JUVEN PO PACK
1.0000 | PACK | Freq: Two times a day (BID) | ORAL | Status: DC
  Administered 2017-02-16 – 2017-02-19 (×3): 1 via ORAL

## 2017-02-16 NOTE — Progress Notes (Signed)
[facility] -  at [facility]   PATIENT NAME: Jonathon York    MR#:  [id number]  DATE OF BIRTH:  1975/05/10  SUBJECTIVE:  Patient presented with foul-smelling discharge from his decubitus. He has some nausea.  REVIEW OF SYSTEMS:   Review of Systems  Constitutional: Positive for weight loss. Negative for chills and fever.  HENT: Negative for ear discharge, ear pain and nosebleeds.   Eyes: Negative for blurred vision, pain and discharge.  Respiratory: Negative for sputum production, shortness of breath, wheezing and stridor.   Cardiovascular: Negative for chest pain, palpitations, orthopnea and PND.  Gastrointestinal: Positive for nausea. Negative for abdominal pain, diarrhea and vomiting.  Genitourinary: Negative for frequency and urgency.  Musculoskeletal: Negative for back pain and joint pain.  Skin:       Chronic stage IV ulcers  Neurological: Negative for sensory change, speech change, focal weakness and weakness.  Psychiatric/Behavioral: Negative for depression and hallucinations. The patient is not nervous/anxious.    Tolerating Diet: Some Tolerating PT: Bedbound secondary to paraplegia  DRUG ALLERGIES:   Allergies  Allergen Reactions  . Orange Fruit [Citrus] Other (See Comments)    blisters    VITALS:  Blood pressure (!) 88/52, pulse 88, temperature 98.2 F (36.8 C), temperature source Oral, resp. rate 16, height 5\' 9"  (1.753 m), weight 43.1 kg (95 lb), SpO2 100 %.  PHYSICAL EXAMINATION:   Physical Exam  GENERAL:  41 y.o.-year-old patient lying in the bed with no acute distress.  EYES: Pupils equal, round, reactive to light and accommodation. No scleral icterus. Extraocular muscles intact.  HEENT: Head atraumatic, normocephalic. Oropharynx and nasopharynx clear.  NECK:  Supple, no jugular venous distention. No thyroid enlargement, no tenderness.  LUNGS: Normal breath sounds bilaterally, no wheezing, rales, rhonchi. No use of  accessory muscles of respiration.  CARDIOVASCULAR: S1, S2 normal. No murmurs, rubs, or gallops.  ABDOMEN: Soft, nontender, nondistended. Bowel sounds present. No organomegaly or mass.  EXTREMITIES: No cyanosis, clubbing or edema b/l.    NEUROLOGIC: Functional paraplegia PSYCHIATRIC:  patient is alert and oriented x 3.  SKIN: Stage IV decubitus chronic  LABORATORY PANEL:  CBC  Recent Labs Lab 02/15/17 0328  WBC 8.2  HGB 8.2*  HCT 25.9*  PLT 491*    Chemistries   Recent Labs Lab 02/14/17 1941 02/15/17 0328  NA 137 139  K 4.0 3.3*  CL 101 109  CO2 25 25  GLUCOSE 110* 121*  BUN 11 9  CREATININE 0.60* 0.43*  CALCIUM 8.7* 7.7*  AST 21  --   ALT 29  --   ALKPHOS 134*  --   BILITOT 0.3  --    Cardiac Enzymes  Recent Labs Lab 02/14/17 1941  TROPONINI <0.03   RADIOLOGY:  Dg Chest 2 View  Result Date: 02/14/2017 CLINICAL DATA:  Fevers, chills, paraplegia EXAM: CHEST  2 VIEW COMPARISON:  11/19/2016 FINDINGS: There is no focal parenchymal opacity. There is no pleural effusion or pneumothorax. The heart and mediastinal contours are unremarkable. Posterior spinal fixation hardware transfixing the thoracolumbar spine. IMPRESSION: No active cardiopulmonary disease. Electronically Signed   By: Elige Ko   On: 02/14/2017 20:20   Ct Angio Chest Pe W Or Wo Contrast  Result Date: 02/15/2017 CLINICAL DATA:  41 y/o M; malaise, foul smell from decubitus ulcers, sepsis. History of pulmonary embolus. EXAM: CT ANGIOGRAPHY CHEST CT ABDOMEN AND PELVIS WITH CONTRAST TECHNIQUE: Multidetector CT imaging of the chest was performed using the standard protocol during bolus administration  of intravenous contrast. Multiplanar CT image reconstructions and MIPs were obtained to evaluate the vascular anatomy. Multidetector CT imaging of the abdomen and pelvis was performed using the standard protocol during bolus administration of intravenous contrast. CONTRAST:  100 cc Isovue 370 COMPARISON:  None.  FINDINGS: CTA CHEST FINDINGS Cardiovascular: Linear membrane within the right lower lobe pulmonary artery is compatible with chronic pulmonary embolus (series 5, image 144). No rounded central filling defect to suggest acute pulmonary embolus. No CT findings of right heart strain. Normal caliber main pulmonary artery and thoracic aorta. Normal heart size. No pericardial effusion. Pectus excavatum. Mediastinum/Nodes: No enlarged mediastinal, hilar, or axillary lymph nodes. Thyroid gland, trachea, and esophagus demonstrate no significant findings. Lungs/Pleura: Lungs are clear. No pleural effusion or pneumothorax. Musculoskeletal: T7- L1 spinal hardware with pedicle screws at the T12 and L1 level and laminar hooks at the T7 through T11 levels. Chronic fracture deformity of the T11 vertebral body with posterior retropulsion of superior endplate and bilateral laminar hooks which are centrally located within the canal resulting in multifactorial severe canal stenosis. No acute osseous abnormality identified. Review of the MIP images confirms the above findings. CT ABDOMEN and PELVIS FINDINGS Hepatobiliary: Segment 7 subcentimeter lucency compatible with cysts. No other focal liver lesion identified. Normal gallbladder. No intra or extrahepatic biliary ductal dilatation. Pancreas: Unremarkable. No pancreatic ductal dilatation or surrounding inflammatory changes. Spleen: Spleen measures 5.2 x 9.5 x 17.3 cm (volume = 450 cm^3). Adrenals/Urinary Tract: Normal adrenal glands. Punctate right kidney lower pole nonobstructing stone. Left kidney subcentimeter lucencies compatible with cysts. No hydronephrosis. Foley catheter within the bladder which demonstrates mild wall thickening compatible with neurogenic bladder. Stomach/Bowel: Stomach is within normal limits. Appendix appears normal. No evidence of bowel wall thickening, distention, or inflammatory changes. Vascular/Lymphatic: Aortic atherosclerosis. No enlarged abdominal  or pelvic lymph nodes. Reproductive: Prostate is unremarkable. Other: No ascites or abdominal wall hernia peer Musculoskeletal: Extensive decubitus ulcers with open exposure of the right ischium. Periosteal reaction of right ischium, near complete erosion of left inferior pubic ramus, and deformity and partial erosion of the coccyx compatible sequelae osteomyelitis. Multiple erosion demonstrated sclerotic margins compatible with chronic osteomyelitis. Defect in right ischium cortex with lucent margin likely representing acute osteomyelitis (series 11, image 88). Extensive edema and enhancement of the right greater than left gluteus and right posterior upper thigh musculature. Addition, there is diffuse dermal thickening of the upper thighs and gluteal region with subcutaneous fat stranding compatible with cellulitis. No discrete rim enhancing collection is identified. Review of the MIP images confirms the above findings. IMPRESSION: CTA chest: 1. 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 No acute pulmonary embolus identified. 2. No acute pulmonary process. CT abdomen and pelvis: 1. Extensive decubitus ulcer.  Open exposure of right ischium. 2. Periosteal reaction of right ischium, near complete erosion of left inferior pubic ramus, and deformity/partial erosion of the coccyx is compatible sequelae of osteomyelitis. 3. Multiple bony erosions demonstrated sclerotic margins compatible with chronic osteomyelitis. Defect in right posterior ischium cortex with lucent margin likely representing acute osteomyelitis. 4. Diffuse inflammatory changes of dermis, subcutaneous fat, gluteal muscles, and upper thigh muscles, greater on the right likely related to infection/ inflammation. No rim enhancing abscess identified. 5. Right kidney lower pole nonobstructing nephrolithiasis. 6. Mild splenomegaly. Electronically Signed   By: Mitzi HansenLance  Furusawa-Stratton M.D.   On: 02/15/2017 05:46   Ct Abdomen Pelvis W  Contrast  Result Date: 02/15/2017 CLINICAL DATA:  41 y/o M; malaise, foul smell from decubitus  ulcers, sepsis. History of pulmonary embolus. EXAM: CT ANGIOGRAPHY CHEST CT ABDOMEN AND PELVIS WITH CONTRAST TECHNIQUE: Multidetector CT imaging of the chest was performed using the standard protocol during bolus administration of intravenous contrast. Multiplanar CT image reconstructions and MIPs were obtained to evaluate the vascular anatomy. Multidetector CT imaging of the abdomen and pelvis was performed using the standard protocol during bolus administration of intravenous contrast. CONTRAST:  100 cc Isovue 370 COMPARISON:  None. FINDINGS: CTA CHEST FINDINGS Cardiovascular: Linear membrane within the right lower lobe pulmonary artery is compatible with chronic pulmonary embolus (series 5, image 144). No rounded central filling defect to suggest acute pulmonary embolus. No CT findings of right heart strain. Normal caliber main pulmonary artery and thoracic aorta. Normal heart size. No pericardial effusion. Pectus excavatum. Mediastinum/Nodes: No enlarged mediastinal, hilar, or axillary lymph nodes. Thyroid gland, trachea, and esophagus demonstrate no significant findings. Lungs/Pleura: Lungs are clear. No pleural effusion or pneumothorax. Musculoskeletal: T7- L1 spinal hardware with pedicle screws at the T12 and L1 level and laminar hooks at the T7 through T11 levels. Chronic fracture deformity of the T11 vertebral body with posterior retropulsion of superior endplate and bilateral laminar hooks which are centrally located within the canal resulting in multifactorial severe canal stenosis. No acute osseous abnormality identified. Review of the MIP images confirms the above findings. CT ABDOMEN and PELVIS FINDINGS Hepatobiliary: Segment 7 subcentimeter lucency compatible with cysts. No other focal liver lesion identified. Normal gallbladder. No intra or extrahepatic biliary ductal dilatation. Pancreas: Unremarkable.  No pancreatic ductal dilatation or surrounding inflammatory changes. Spleen: Spleen measures 5.2 x 9.5 x 17.3 cm (volume = 450 cm^3). Adrenals/Urinary Tract: Normal adrenal glands. Punctate right kidney lower pole nonobstructing stone. Left kidney subcentimeter lucencies compatible with cysts. No hydronephrosis. Foley catheter within the bladder which demonstrates mild wall thickening compatible with neurogenic bladder. Stomach/Bowel: Stomach is within normal limits. Appendix appears normal. No evidence of bowel wall thickening, distention, or inflammatory changes. Vascular/Lymphatic: Aortic atherosclerosis. No enlarged abdominal or pelvic lymph nodes. Reproductive: Prostate is unremarkable. Other: No ascites or abdominal wall hernia peer Musculoskeletal: Extensive decubitus ulcers with open exposure of the right ischium. Periosteal reaction of right ischium, near complete erosion of left inferior pubic ramus, and deformity and partial erosion of the coccyx compatible sequelae osteomyelitis. Multiple erosion demonstrated sclerotic margins compatible with chronic osteomyelitis. Defect in right ischium cortex with lucent margin likely representing acute osteomyelitis (series 11, image 88). Extensive edema and enhancement of the right greater than left gluteus and right posterior upper thigh musculature. Addition, there is diffuse dermal thickening of the upper thighs and gluteal region with subcutaneous fat stranding compatible with cellulitis. No discrete rim enhancing collection is identified. Review of the MIP images confirms the above findings. IMPRESSION: CTA chest: 1. 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 No acute pulmonary embolus identified. 2. No acute pulmonary process. CT abdomen and pelvis: 1. Extensive decubitus ulcer.  Open exposure of right ischium. 2. Periosteal reaction of right ischium, near complete erosion of left inferior pubic ramus, and deformity/partial erosion of the  coccyx is compatible sequelae of osteomyelitis. 3. Multiple bony erosions demonstrated sclerotic margins compatible with chronic osteomyelitis. Defect in right posterior ischium cortex with lucent margin likely representing acute osteomyelitis. 4. Diffuse inflammatory changes of dermis, subcutaneous fat, gluteal muscles, and upper thigh muscles, greater on the right likely related to infection/ inflammation. No rim enhancing abscess identified. 5. Right kidney lower pole nonobstructing nephrolithiasis. 6. Mild splenomegaly. Electronically Signed   By:  Mitzi Hansen M.D.   On: 02/15/2017 05:46   ASSESSMENT AND PLAN:   Jonathon York  is a 41 y.o. male who presents with complaint of malaise and foul smell from his decubitus ulcers. He came in to the ED and was found to meet sepsis criteria with tachycardia, fever, leukocytosis  1. Sepsis due to infected decubitus ulcer Pt has had debridement by surgery- 11/19/16 and went to [facility] for IV abxs Wound care consult appreciated Treat with broad-spectrum antibiotics with IV vancomycin and Zosyn -CT shows OM and severe wounds with chornic changes -Surgery recommends no debridement at present - ID consult pending  2. Nausea Prn zofran -prn phenergan  3. Bladder dysfunction -has foley cath +  4. Failure to thrive With severe protein calorie malnutrition  -Dietitian to see  5. Misc: lovenox for dvt proph  6. 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 Management plans discussed with the patient, family and they are in agreement.  CODE STATUS: full  DVT Prophylaxis: lovenox  TOTAL TIME TAKING CARE OF THIS PATIENT: *30* minutes.  >50% time spent on counselling and coordination of care  POSSIBLE D/C IN few DAYS, DEPENDING ON CLINICAL CONDITION.  Note: This dictation was prepared with Dragon dictation along with smaller phrase technology. Any transcriptional  errors that result from this process are unintentional.  Nakeshia Waldeck M.D on 02/16/2017 at 4:43 PM  Between 7am to 6pm - Pager - [phone]  After 6pm go to [email or web address] - Social research officer, government  [facility]  Office  [phone]  CC: Primary care physician; Dan Humphreys, [facility] CarePatient ID: Jonathon York, male   DOB: 1976-01-01, 41 y.o.   MRN: [id number]

## 2017-02-16 NOTE — Progress Notes (Signed)
Initial Nutrition Assessment  DOCUMENTATION CODES:   Severe malnutrition in context of chronic illness, Underweight  INTERVENTION:  Recommend liberalizing diet to regular.  Provide Ensure Enlive po BID, each supplement provides 350 kcal and 20 grams of protein.  Provide Juven po BID, each supplement provides 80 kcal and 14 grams amino acids essential for wound healing. Patient would prefer fruit punch flavor.  Provide daily multivitamin with minerals.  Recommend Vitamin C 500 mg BID for two weeks and Zinc sulfate 220 mg daily (provides 50 mg elemental zinc) for two weeks. Would only recommend longer supplementation if labs can be monitored closely as long-term zinc supplementation can lead to copper deficiency.  Encouraged adequate intake of calories and protein at meals. Also encouraged adequate hydration to promote bowel motility in addition to patient's current bowel regimen.  NUTRITION DIAGNOSIS:   Malnutrition (Severe) related to chronic illness (chronic non-healing stage III and stage IV pressure ulcers with osteomyelitis) as evidenced by 9.4 percent weight loss over 3 months, severe depletion of body fat, moderate depletions of muscle mass.  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   Met with patient at bedside. He is known to this RD from previous admission in 03/2016.  It appears he also saw an RD during recent visit in 10/2016. Patient reports that after his discharge in July he went to [facility]. He then went to [facility], followed by a stay at [facility]. He was discharged back home from [facility] on October 5th, but has difficulty caring for wounds by himself due to their location. He also was not given any oral nutrition supplements or vitamins/minerals for wound healing. He was taking a generic men's multivitamin daily at home. He reports he had been eating pretty well lately, except now he has some abdominal pressure that feels like it may be constipation. He attempts to eat 2-3 meals per day, but is unable to provide any further details. He has had some pizza today. Patient would like to get started back on oral nutrition supplements and vitamins.  UBW was 155 lbs 16 years ago. Patient reports he is unsure of his weight trend now as his weight fluctuates a lot. Noted in chart he was 94.1 lbs on 04/15/2016, 104.8 lbs on 11/21/2016, and is currently documented as 95 lbs (though unsure this is a true measured weight). If patient truly is back to 95 lbs, that would be a significant weight loss of 9.8 lbs (9.4% body weight) over 3 months.  Medications reviewed and include: Megace, Zosyn, vancomycin.  Labs reviewed: Potassium 3.3.  Nutrition-Focused physical exam completed. Findings are severe fat depletion (severe depletion of orbital region, upper arm region, thoracic/lumbar region), moderate-severe muscle depletion (moderate depletion of clavicle bone region, clavicle/acromion bone region, scapular bone region, dorsal hand; severe depletion of temple region, patellar region, anterior thigh region, posterior calf region), and no edema. Hx of paraplegia would explain  severe wasting found in patellar, anterior thigh, and posterior calf regions, but does not account for muscle wasting in upper body or fat wasting.  Diet Order:  Diet Heart Room service  appropriate? Yes; Fluid consistency: Thin  Skin:  Wound (see comment) (see below)  Stg IV sacrum (9cm x 12cm x 0.6cm), Stg III left ischial tuberosity (5cm x 5cm x 0.4cm)   Last BM:  PTA (02/13/2017 per chart)  Height:   Ht Readings from Last 1 Encounters:  02/14/17 5' 9" (1.753 m)    Weight:   Wt Readings from Last 1 Encounters:  02/14/17 95 lb (43.1 kg)    Ideal Body Weight:  72.7 kg  BMI:  Body mass index is 14.03 kg/m.  Estimated Nutritional Needs:   Kcal:  3299-2426 (MSJ x 1.3-1.5)  Protein:  70-86 grams (1.6-2 grams/kg)  Fluid:  1.5-1.7 L/day (35-40 ml/kg)  EDUCATION NEEDS:   Education needs addressed  Willey Blade, [place], [place], [place] Office: [phone] Pager: [phone] After Hours/Weekend Pager: [phone]

## 2017-02-17 LAB — URINE CULTURE: Culture: >=100000 — AB

## 2017-02-17 LAB — VANCOMYCIN, TROUGH: Vancomycin Tr: 4 ug/mL — ABNORMAL LOW (ref 15–20)

## 2017-02-17 MED ORDER — ENOXAPARIN SODIUM 30 MG/0.3ML ~~LOC~~ SOLN
30.0000 mg | SUBCUTANEOUS | Status: DC
  Administered 2017-02-17 – 2017-02-18 (×2): 30 mg via SUBCUTANEOUS
  Filled 2017-02-17 (×2): qty 0.3

## 2017-02-17 MED ORDER — ZINC SULFATE 220 (50 ZN) MG PO CAPS
220.0000 mg | ORAL_CAPSULE | Freq: Every day | ORAL | Status: DC
  Administered 2017-02-17 – 2017-02-19 (×3): 220 mg via ORAL
  Filled 2017-02-17 (×3): qty 1

## 2017-02-17 MED ORDER — VANCOMYCIN HCL IN DEXTROSE 1-5 GM/200ML-% IV SOLN
1000.0000 mg | Freq: Three times a day (TID) | INTRAVENOUS | Status: DC
  Administered 2017-02-18 – 2017-02-19 (×4): 1000 mg via INTRAVENOUS
  Filled 2017-02-17 (×13): qty 200

## 2017-02-17 MED ORDER — VITAMIN C 500 MG PO TABS
500.0000 mg | ORAL_TABLET | Freq: Two times a day (BID) | ORAL | Status: DC
  Administered 2017-02-17 – 2017-02-19 (×5): 500 mg via ORAL
  Filled 2017-02-17 (×6): qty 1

## 2017-02-17 NOTE — Progress Notes (Signed)
lovenox dose decreased to 30mg  q 24hr due to pt weight <45kg  Jonathon York Kenlee Vogt, Pharm.York, BCPS Clinical Pharmacist

## 2017-02-17 NOTE — Progress Notes (Signed)
[facility] [place] [facility]   PATIENT NAME: Jonathon York    MR#:  [id number]  DATE OF BIRTH:  01-Jun-1975  SUBJECTIVE:  Patient presented with foul-smelling discharge from his decubitus. He has some nausea. Eating better now No fever  REVIEW OF SYSTEMS:   Review of Systems  Constitutional: Positive for weight loss. Negative for chills and fever.  HENT: Negative for ear discharge, ear pain and nosebleeds.   Eyes: Negative for blurred vision, pain and discharge.  Respiratory: Negative for sputum production, shortness of breath, wheezing and stridor.   Cardiovascular: Negative for chest pain, palpitations, orthopnea and PND.  Gastrointestinal: Positive for nausea. Negative for abdominal pain, diarrhea and vomiting.  Genitourinary: Negative for frequency and urgency.  Musculoskeletal: Negative for back pain and joint pain.  Skin:       Chronic stage IV ulcers  Neurological: Negative for sensory change, speech change, focal weakness and weakness.  Psychiatric/Behavioral: Negative for depression and hallucinations. The patient is not nervous/anxious.    Tolerating Diet: Some Tolerating PT: Bedbound secondary to paraplegia  DRUG ALLERGIES:   Allergies  Allergen Reactions  . Orange Fruit [Citrus] Other (See Comments)    blisters    VITALS:  Blood pressure (!) 84/57, pulse 98, temperature 98.4 F (36.9 C), temperature source Oral, resp. rate 17, height 5\' 9"  (1.753 m), weight 43.1 kg (95 lb), SpO2 99 %.  PHYSICAL EXAMINATION:   Physical Exam  GENERAL:  41 y.o.-year-old patient lying in the bed with no acute distress.  EYES: Pupils equal, round, reactive to light and accommodation. No scleral icterus. Extraocular muscles intact.  HEENT: Head atraumatic, normocephalic. Oropharynx and nasopharynx clear.  NECK:  Supple, no jugular venous distention. No thyroid enlargement, no tenderness.  LUNGS: Normal breath sounds bilaterally, no wheezing,  rales, rhonchi. No use of accessory muscles of respiration.  CARDIOVASCULAR: S1, S2 normal. No murmurs, rubs, or gallops.  ABDOMEN: Soft, nontender, nondistended. Bowel sounds present. No organomegaly or mass.  EXTREMITIES: No cyanosis, clubbing or edema b/l.   Chronic atrophy Severe decubitus++ with dressing NEUROLOGIC: Functional paraplegia PSYCHIATRIC:  patient is alert and oriented x 3.  SKIN: Stage IV decubitus chronic  LABORATORY PANEL:  CBC  Recent Labs Lab 02/15/17 0328  WBC 8.2  HGB 8.2*  HCT 25.9*  PLT 491*    Chemistries   Recent Labs Lab 02/14/17 1941 02/15/17 0328  NA 137 139  K 4.0 3.3*  CL 101 109  CO2 25 25  GLUCOSE 110* 121*  BUN 11 9  CREATININE 0.60* 0.43*  CALCIUM 8.7* 7.7*  AST 21  --   ALT 29  --   ALKPHOS 134*  --   BILITOT 0.3  --    Cardiac Enzymes  Recent Labs Lab 02/14/17 1941  TROPONINI <0.03   RADIOLOGY:  No results found. ASSESSMENT AND PLAN:   Jonathon York  is a 41 y.o. male who presents with complaint of malaise and foul smell from his decubitus ulcers. He came in to the ED and was found to meet sepsis criteria with tachycardia, fever, leukocytosis  1. Sepsis due to infected decubitus ulcer Pt has had debridement by surgery- 11/19/16 and went to [facility] for IV abxs Wound care consult appreciated Treat with broad-spectrum antibiotics with IV vancomycin and Zosyn -CT shows OM and severe wounds with chornic changes -Surgery recommends no debridement at present - ID consulted to determine about abxs and how long   2. Nausea Prn zofran -prn phenergan  3. Bladder dysfunction -has chronic foley cath +  4. Failure to thrive With severe protein calorie malnutrition  -Dietitian to see  5. Misc: lovenox for dvt proph  6. Functional paraplegia secondary to spinal cord injury  CSW/CM for d/c planning  Case discussed with Care Management/Social Worker. Management plans discussed with the patient, family and they  are in agreement.  CODE STATUS: full  DVT Prophylaxis: lovenox  TOTAL TIME TAKING CARE OF THIS PATIENT: *30* minutes.  >50% time spent on counselling and coordination of care  POSSIBLE D/C IN few DAYS, DEPENDING ON CLINICAL CONDITION.  Note: This dictation was prepared with Dragon dictation along with smaller phrase technology. Any transcriptional errors that result from this process are unintentional.  Jonathon York M.D on 02/17/2017 at 12:18 PM  Between 7am to 6pm - Pager - [phone]  After 6pm go to [email or web address] - Social research officer, government  [facility]  Office  [phone]  CC: Primary care physician; Jonathon York, [facility]Patient ID: Jonathon York, male   DOB: Mar 06, 1976, 41 y.o.   MRN: [id number]

## 2017-02-17 NOTE — Consult Note (Signed)
[facility] Infectious Disease     Reason for Consult: Wound infection    Referring Physician: Serita Grit Date of Admission:  02/14/2017   Principal Problem:   Sepsis ([place]) Active Problems:   Paraplegia ([place])   Neurogenic bladder   Decubitus ulcer of sacral region   Severe recurrent major depression without psychotic features [facility])   HPI: Jonathon York is a 41 y.o. male with a history of paraplegia admitted with worsening drainage from his chronic  wounds.  On admit wbc 13, temp 101.  Started on vanco and zosyn.  UA with TNTC WBC, UCX E coli.   CT shows multiple wounds and underlying osteomyelitis.  He has a long history of wounds some pressure ulcers.  He has seen numerous providers and physicians for this. He has been at [facility] in past for prolonged periods without healing.  On admission he was seen by surgery and it was felt no immediate debridement was needed forl decubitus ulcers.  It was felt he would best be served by flap closure. Patient is largely wheelchair-bound.  Says has dealt with this would for several years. Not having any wound care currently. Was applying vinegar and salt to the wound. Quit smoking. Lives alone and states he cannot take care of his wounds by himself. He has a regular bed with memory foam at home.     Past Medical History:  Diagnosis Date  . Anxiety   . Decubitus ulcer   . Depression   . Neurogenic bladder   . Osteomyelitis ([place])   . Paraparesis of both lower limbs ([place]) 02/21/00   Past Surgical History:  Procedure Laterality Date  . AMPUTATION TOE Right 04/29/2015   Procedure: AMPUTATION TOE;  Surgeon: Sharlotte Alamo, MD;  Location: [facility] ORS;  Service: Podiatry;  Laterality: Right;  . IRRIGATION AND DEBRIDEMENT BUTTOCKS    . TOE AMPUTATION Right    Social History  Substance Use Topics  . Smoking status: Current Some Day Smoker    Packs/day: 0.50    Types: Cigarettes  . Smokeless tobacco: Never Used  . Alcohol use No   Family History   Problem Relation Age of Onset  . Lymphoma Sister     Allergies:  Allergies  Allergen Reactions  . Orange Fruit [Citrus] Other (See Comments)    blisters    Current antibiotics: Antibiotics Given (last 72 hours)    Date/Time Action Medication Dose Rate   02/14/17 2121 New Bag/Given   piperacillin-tazobactam (ZOSYN) IVPB 3.375 g 3.375 g 100 mL/hr   02/14/17 2121 New Bag/Given   vancomycin (VANCOCIN) IVPB 1000 mg/200 mL premix 1,000 mg 200 mL/hr   02/15/17 0316 New Bag/Given   piperacillin-tazobactam (ZOSYN) IVPB 3.375 g 3.375 g 12.5 mL/hr   02/15/17 1334 New Bag/Given   piperacillin-tazobactam (ZOSYN) IVPB 3.375 g 3.375 g 12.5 mL/hr   02/15/17 1335 New Bag/Given   vancomycin (VANCOCIN) 500 mg in sodium chloride 0.9 % 100 mL IVPB 500 mg 100 mL/hr   02/15/17 2122 New Bag/Given   piperacillin-tazobactam (ZOSYN) IVPB 3.375 g 3.375 g 12.5 mL/hr   02/16/17 0045 New Bag/Given   vancomycin (VANCOCIN) 500 mg in sodium chloride 0.9 % 100 mL IVPB 500 mg 100 mL/hr   02/16/17 0605 New Bag/Given   piperacillin-tazobactam (ZOSYN) IVPB 3.375 g 3.375 g 12.5 mL/hr   02/16/17 1311 New Bag/Given   piperacillin-tazobactam (ZOSYN) IVPB 3.375 g 3.375 g 12.5 mL/hr   02/16/17 1311 New Bag/Given   vancomycin (VANCOCIN) 500 mg in sodium chloride  0.9 % 100 mL IVPB 500 mg 100 mL/hr   02/16/17 2314 New Bag/Given   piperacillin-tazobactam (ZOSYN) IVPB 3.375 g 3.375 g 12.5 mL/hr   02/17/17 0103 New Bag/Given   vancomycin (VANCOCIN) 500 mg in sodium chloride 0.9 % 100 mL IVPB 500 mg 100 mL/hr      MEDICATIONS: . Chlorhexidine Gluconate Cloth  6 each Topical Q0600  . enoxaparin (LOVENOX) injection  30 mg Subcutaneous Q24H  . feeding supplement (ENSURE ENLIVE)  237 mL Oral BID BM  . Influenza vac split quadrivalent PF  0.5 mL Intramuscular Tomorrow-1000  . megestrol  800 mg Oral Daily  . multivitamin with minerals  1 tablet Oral Daily  . mupirocin ointment  1 application Nasal BID  . nicotine  7 mg  Transdermal Daily  . nutrition supplement (JUVEN)  1 packet Oral BID WC  . oxybutynin  15 mg Oral BID  . sodium hypochlorite   Irrigation Daily  . vitamin C  500 mg Oral BID  . zinc sulfate  220 mg Oral Daily    Review of Systems - 11 systems reviewed and negative per HPI   OBJECTIVE: Temp:  [98.1 F (36.7 C)-98.4 F (36.9 C)] 98.4 F (36.9 C) (10/22 1155) Pulse Rate:  [97-104] 98 (10/22 1155) Resp:  [17-18] 17 (10/22 0641) BP: (84-91)/(48-57) 84/57 (10/22 1155) SpO2:  [99 %-100 %] 99 % (10/22 1155) Physical Exam  Constitutional: He is oriented to person, place, and time. Thin, muscular upper bod HENT: anicteric Mouth/Throat: Oropharynx is clear. No oropharyngeal exudate.  Cardiovascular: Normal rate, regular rhythm and normal heart sounds. \Pulmonary/Chest: Effort normal and breath sounds normal. No respiratory distress. He has no wheezes.  Abdominal: Soft. Bowel sounds are normal. He exhibits no distension. There is no tenderness.  Lymphadenopathy: He has no cervical adenopathy.  Neurological: He is alert and oriented to person, place, and time. LE paraplegia Skin: very large but relatively shallow sacral ulcer, with exposed muscle but clean base. No exposed bone. L ischial wound 4x 5 cm with some undermining, exposed bone.  Psychiatric: He has a normal mood and affect. His behavior is normal.     LABS: Results for orders placed or performed during the hospital encounter of 02/14/17 (from the past 48 hour(s))  Vancomycin, trough     Status: Abnormal   Collection Time: 02/16/17 11:50 PM  Result Value Ref Range   Vancomycin Tr 4 (L) 15 - 20 ug/mL   No components found for: ESR, C REACTIVE PROTEIN MICRO: Recent Results (from the past 720 hour(s))  Blood Culture (routine x 2)     Status: None (Preliminary result)   Collection Time: 02/14/17  7:39 PM  Result Value Ref Range Status   Specimen Description BLOOD RIGHT ASSIST CONTROL  Final   Special Requests   Final     BOTTLES DRAWN AEROBIC AND ANAEROBIC Blood Culture adequate volume   Culture NO GROWTH 3 DAYS  Final   Report Status PENDING  Incomplete  Blood Culture (routine x 2)     Status: None (Preliminary result)   Collection Time: 02/14/17  7:46 PM  Result Value Ref Range Status   Specimen Description BLOOD LEFT FOREARM  Final   Special Requests   Final    BOTTLES DRAWN AEROBIC AND ANAEROBIC Blood Culture adequate volume   Culture NO GROWTH 3 DAYS  Final   Report Status PENDING  Incomplete  Urine culture     Status: Abnormal   Collection Time: 02/14/17  9:45 PM  Result Value Ref Range Status   Specimen Description URINE, RANDOM  Final   Special Requests NONE  Final   Culture >=100,000 COLONIES/mL ESCHERICHIA COLI (A)  Final   Report Status 02/17/2017 FINAL  Final   Organism ID, Bacteria ESCHERICHIA COLI (A)  Final      Susceptibility   Escherichia coli - MIC*    AMPICILLIN <=2 SENSITIVE Sensitive     CEFAZOLIN <=4 SENSITIVE Sensitive     CEFTRIAXONE <=1 SENSITIVE Sensitive     CIPROFLOXACIN <=0.25 SENSITIVE Sensitive     GENTAMICIN <=1 SENSITIVE Sensitive     IMIPENEM <=0.25 SENSITIVE Sensitive     NITROFURANTOIN <=16 SENSITIVE Sensitive     TRIMETH/SULFA <=20 SENSITIVE Sensitive     AMPICILLIN/SULBACTAM <=2 SENSITIVE Sensitive     PIP/TAZO <=4 SENSITIVE Sensitive     Extended ESBL NEGATIVE Sensitive     * >=100,000 COLONIES/mL ESCHERICHIA COLI  MRSA PCR Screening     Status: Abnormal   Collection Time: 02/14/17 11:23 PM  Result Value Ref Range Status   MRSA by PCR POSITIVE (A) NEGATIVE Final    Comment:        The GeneXpert MRSA Assay (FDA approved for NASAL specimens only), is one component of a comprehensive MRSA colonization surveillance program. It is not intended to diagnose MRSA infection nor to guide or monitor treatment for MRSA infections. RESULT CALLED TO, READ BACK BY AND VERIFIED WITH: STEVEN SYKES AT 0120 ON 02/15/17 RWW     IMAGING: Dg Chest 2 View  Result  Date: 02/14/2017 CLINICAL DATA:  Fevers, chills, paraplegia EXAM: CHEST  2 VIEW COMPARISON:  11/19/2016 FINDINGS: There is no focal parenchymal opacity. There is no pleural effusion or pneumothorax. The heart and mediastinal contours are unremarkable. Posterior spinal fixation hardware transfixing the thoracolumbar spine. IMPRESSION: No active cardiopulmonary disease. Electronically Signed   By: Kathreen Devoid   On: 02/14/2017 20:20   Ct Angio Chest Pe W Or Wo Contrast  Result Date: 02/15/2017 CLINICAL DATA:  41 y/o M; malaise, foul smell from decubitus ulcers, sepsis. History of pulmonary embolus. EXAM: CT ANGIOGRAPHY CHEST CT ABDOMEN AND PELVIS WITH CONTRAST TECHNIQUE: Multidetector CT imaging of the chest was performed using the standard protocol during bolus administration of intravenous contrast. Multiplanar CT image reconstructions and MIPs were obtained to evaluate the vascular anatomy. Multidetector CT imaging of the abdomen and pelvis was performed using the standard protocol during bolus administration of intravenous contrast. CONTRAST:  100 cc Isovue 370 COMPARISON:  None. FINDINGS: CTA CHEST FINDINGS Cardiovascular: Linear membrane within the right lower lobe pulmonary artery is compatible with chronic pulmonary embolus (series 5, image 144). No rounded central filling defect to suggest acute pulmonary embolus. No CT findings of right heart strain. Normal caliber main pulmonary artery and thoracic aorta. Normal heart size. No pericardial effusion. Pectus excavatum. Mediastinum/Nodes: No enlarged mediastinal, hilar, or axillary lymph nodes. Thyroid gland, trachea, and esophagus demonstrate no significant findings. Lungs/Pleura: Lungs are clear. No pleural effusion or pneumothorax. Musculoskeletal: T7- L1 spinal hardware with pedicle screws at the T12 and L1 level and laminar hooks at the T7 through T11 levels. Chronic fracture deformity of the T11 vertebral body with posterior retropulsion of superior  endplate and bilateral laminar hooks which are centrally located within the canal resulting in multifactorial severe canal stenosis. No acute osseous abnormality identified. Review of the MIP images confirms the above findings. CT ABDOMEN and PELVIS FINDINGS Hepatobiliary: Segment 7 subcentimeter lucency compatible with cysts. No other focal liver lesion  identified. Normal gallbladder. No intra or extrahepatic biliary ductal dilatation. Pancreas: Unremarkable. No pancreatic ductal dilatation or surrounding inflammatory changes. Spleen: Spleen measures 5.2 x 9.5 x 17.3 cm (volume = 450 cm^3). Adrenals/Urinary Tract: Normal adrenal glands. Punctate right kidney lower pole nonobstructing stone. Left kidney subcentimeter lucencies compatible with cysts. No hydronephrosis. Foley catheter within the bladder which demonstrates mild wall thickening compatible with neurogenic bladder. Stomach/Bowel: Stomach is within normal limits. Appendix appears normal. No evidence of bowel wall thickening, distention, or inflammatory changes. Vascular/Lymphatic: Aortic atherosclerosis. No enlarged abdominal or pelvic lymph nodes. Reproductive: Prostate is unremarkable. Other: No ascites or abdominal wall hernia peer Musculoskeletal: Extensive decubitus ulcers with open exposure of the right ischium. Periosteal reaction of right ischium, near complete erosion of left inferior pubic ramus, and deformity and partial erosion of the coccyx compatible sequelae osteomyelitis. Multiple erosion demonstrated sclerotic margins compatible with chronic osteomyelitis. Defect in right ischium cortex with lucent margin likely representing acute osteomyelitis (series 11, image 88). Extensive edema and enhancement of the right greater than left gluteus and right posterior upper thigh musculature. Addition, there is diffuse dermal thickening of the upper thighs and gluteal region with subcutaneous fat stranding compatible with cellulitis. No discrete rim  enhancing collection is identified. Review of the MIP images confirms the above findings. IMPRESSION: CTA chest: 1. 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 No acute pulmonary embolus identified. 2. No acute pulmonary process. CT abdomen and pelvis: 1. Extensive decubitus ulcer.  Open exposure of right ischium. 2. Periosteal reaction of right ischium, near complete erosion of left inferior pubic ramus, and deformity/partial erosion of the coccyx is compatible sequelae of osteomyelitis. 3. Multiple bony erosions demonstrated sclerotic margins compatible with chronic osteomyelitis. Defect in right posterior ischium cortex with lucent margin likely representing acute osteomyelitis. 4. Diffuse inflammatory changes of dermis, subcutaneous fat, gluteal muscles, and upper thigh muscles, greater on the right likely related to infection/ inflammation. No rim enhancing abscess identified. 5. Right kidney lower pole nonobstructing nephrolithiasis. 6. Mild splenomegaly. Electronically Signed   By: Kristine Garbe M.D.   On: 02/15/2017 05:46   Ct Abdomen Pelvis W Contrast  Result Date: 02/15/2017 CLINICAL DATA:  41 y/o M; malaise, foul smell from decubitus ulcers, sepsis. History of pulmonary embolus. EXAM: CT ANGIOGRAPHY CHEST CT ABDOMEN AND PELVIS WITH CONTRAST TECHNIQUE: Multidetector CT imaging of the chest was performed using the standard protocol during bolus administration of intravenous contrast. Multiplanar CT image reconstructions and MIPs were obtained to evaluate the vascular anatomy. Multidetector CT imaging of the abdomen and pelvis was performed using the standard protocol during bolus administration of intravenous contrast. CONTRAST:  100 cc Isovue 370 COMPARISON:  None. FINDINGS: CTA CHEST FINDINGS Cardiovascular: Linear membrane within the right lower lobe pulmonary artery is compatible with chronic pulmonary embolus (series 5, image 144). No rounded central filling defect  to suggest acute pulmonary embolus. No CT findings of right heart strain. Normal caliber main pulmonary artery and thoracic aorta. Normal heart size. No pericardial effusion. Pectus excavatum. Mediastinum/Nodes: No enlarged mediastinal, hilar, or axillary lymph nodes. Thyroid gland, trachea, and esophagus demonstrate no significant findings. Lungs/Pleura: Lungs are clear. No pleural effusion or pneumothorax. Musculoskeletal: T7- L1 spinal hardware with pedicle screws at the T12 and L1 level and laminar hooks at the T7 through T11 levels. Chronic fracture deformity of the T11 vertebral body with posterior retropulsion of superior endplate and bilateral laminar hooks which are centrally located within the canal resulting in multifactorial severe canal stenosis. No  acute osseous abnormality identified. Review of the MIP images confirms the above findings. CT ABDOMEN and PELVIS FINDINGS Hepatobiliary: Segment 7 subcentimeter lucency compatible with cysts. No other focal liver lesion identified. Normal gallbladder. No intra or extrahepatic biliary ductal dilatation. Pancreas: Unremarkable. No pancreatic ductal dilatation or surrounding inflammatory changes. Spleen: Spleen measures 5.2 x 9.5 x 17.3 cm (volume = 450 cm^3). Adrenals/Urinary Tract: Normal adrenal glands. Punctate right kidney lower pole nonobstructing stone. Left kidney subcentimeter lucencies compatible with cysts. No hydronephrosis. Foley catheter within the bladder which demonstrates mild wall thickening compatible with neurogenic bladder. Stomach/Bowel: Stomach is within normal limits. Appendix appears normal. No evidence of bowel wall thickening, distention, or inflammatory changes. Vascular/Lymphatic: Aortic atherosclerosis. No enlarged abdominal or pelvic lymph nodes. Reproductive: Prostate is unremarkable. Other: No ascites or abdominal wall hernia peer Musculoskeletal: Extensive decubitus ulcers with open exposure of the right ischium. Periosteal  reaction of right ischium, near complete erosion of left inferior pubic ramus, and deformity and partial erosion of the coccyx compatible sequelae osteomyelitis. Multiple erosion demonstrated sclerotic margins compatible with chronic osteomyelitis. Defect in right ischium cortex with lucent margin likely representing acute osteomyelitis (series 11, image 88). Extensive edema and enhancement of the right greater than left gluteus and right posterior upper thigh musculature. Addition, there is diffuse dermal thickening of the upper thighs and gluteal region with subcutaneous fat stranding compatible with cellulitis. No discrete rim enhancing collection is identified. Review of the MIP images confirms the above findings. IMPRESSION: CTA chest: 1. 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 No acute pulmonary embolus identified. 2. No acute pulmonary process. CT abdomen and pelvis: 1. Extensive decubitus ulcer.  Open exposure of right ischium. 2. Periosteal reaction of right ischium, near complete erosion of left inferior pubic ramus, and deformity/partial erosion of the coccyx is compatible sequelae of osteomyelitis. 3. Multiple bony erosions demonstrated sclerotic margins compatible with chronic osteomyelitis. Defect in right posterior ischium cortex with lucent margin likely representing acute osteomyelitis. 4. Diffuse inflammatory changes of dermis, subcutaneous fat, gluteal muscles, and upper thigh muscles, greater on the right likely related to infection/ inflammation. No rim enhancing abscess identified. 5. Right kidney lower pole nonobstructing nephrolithiasis. 6. Mild splenomegaly. Electronically Signed   By: Kristine Garbe M.D.   On: 02/15/2017 05:46    Assessment:   Jonathon York is a 41 y.o. malewith paraplegia and a large sacral ulcer present for several years as well as newer L ischial wound admitted with fevers, leukocytosis, infection of his wounds.  CT shows changes  of osteomyelitis.  HHe will need extensive long term care to heal this wound. For now would benefit from 4-6 weeks IV abx to treat infection, try to encourage healing and prepare prior to evaluation by plastic surgery for potential flap.  I had seen him in 03/2016 and he was dced on IV zosyn and was supposed to followup with me but never did.   Recommendations Place picc. I have cultured wound and will base abx on this.  Continue vanco and zosyn for now.  Esr and CRP ordered Once I have seen In [facility] will work to get him into [facility] for evaluation Advised him to avoid tobacco which will make healing worse Advised increase protein intake with protein shakes or powder.   Thank you very much for allowing me to participate in the care of this patient. Please call with questions.   Cheral Marker. Ola Spurr, MD

## 2017-02-17 NOTE — Care Management (Signed)
RNCM consult for LTACH.  Patient in agreement.  Patient has been to [facility] before and would like to go there again if possible.  Loury with [facility] contacted for referral .

## 2017-02-17 NOTE — Progress Notes (Signed)
Pharmacy Antibiotic Note  Jonathon LeedsRichard L Recinos is a 41 y.o. male admitted on 02/14/2017 with sepsis.  Pharmacy has been consulted for vancomycin/Zosyn dosing.  Plan: Vancomycin 500 mg IV every 12 hours.  Goal trough 15-20 mcg/mL. Zosyn 3.375g IV q8h (4 hour infusion).  Trough before 4th dose of regimen.   10/21:  VT @ 23:50 = 4 mcg/mL.   Pt appears to have received all scheduled doses on time.   Will increase to Vancomycin 1 gm IV Q8H to start on 10/22 @ 0900. Will recheck VT before 3rd new dose on 10/23 @ 00:30.    Height: 5\' 9"  (175.3 cm) Weight: 95 lb (43.1 kg) IBW/kg (Calculated) : 70.7  Temp (24hrs), Avg:98.2 F (36.8 C), Min:98.1 F (36.7 C), Max:98.4 F (36.9 C)   Recent Labs Lab 02/14/17 1941 02/14/17 2145 02/15/17 0328 02/16/17 2350  WBC 13.2*  --  8.2  --   CREATININE 0.60*  --  0.43*  --   LATICACIDVEN 2.1* 0.7  --   --   VANCOTROUGH  --   --   --  4*    Estimated Creatinine Clearance: 74.1 mL/min (A) (by C-G formula based on SCr of 0.43 mg/dL (L)).    Allergies  Allergen Reactions  . Orange Fruit [Citrus] Other (See Comments)    blisters    Antimicrobials this admission: Vancomycin/Zosyn 10/19 >>  Dose adjustments this admission:   Microbiology results: 10/19  BCx: sent 10/19  UCx: sent    Thank you for allowing pharmacy to be a part of this patient's care.  Panhia Karl D 02/17/2017 1:09 AM

## 2017-02-18 LAB — CREATININE, SERUM: CREATININE: 0.46 mg/dL — AB (ref 0.61–1.24)

## 2017-02-18 LAB — C-REACTIVE PROTEIN: CRP: 10.3 mg/dL — ABNORMAL HIGH (ref <1.0)

## 2017-02-18 LAB — VANCOMYCIN, TROUGH

## 2017-02-18 LAB — SEDIMENTATION RATE: Sed Rate: 127 mm/hr — ABNORMAL HIGH (ref 0–15)

## 2017-02-18 MED ORDER — DAKINS (1/4 STRENGTH) 0.125 % EX SOLN
Freq: Two times a day (BID) | CUTANEOUS | Status: AC
  Administered 2017-02-18: 15:00:00
  Filled 2017-02-18: qty 473

## 2017-02-18 MED ORDER — SODIUM CHLORIDE 0.9% FLUSH: 10.0000 mL | INTRAVENOUS | Status: DC | PRN

## 2017-02-18 MED ORDER — SODIUM CHLORIDE 0.9 % IV SOLN
INTRAVENOUS | Status: DC | PRN
  Administered 2017-02-18: 11:00:00 via INTRAVENOUS

## 2017-02-18 MED ORDER — SODIUM CHLORIDE 0.9% FLUSH
10.0000 mL | Freq: Two times a day (BID) | INTRAVENOUS | Status: DC
Start: 2017-02-18 — End: 2017-02-19
  Administered 2017-02-18 – 2017-02-19 (×3): 10 mL

## 2017-02-18 NOTE — Progress Notes (Signed)
Pharmacy Antibiotic Note  Jonathon York is a 41 y.o. male admitted on 02/14/2017 with sepsis.  Pharmacy has been consulted for vancomycin/Zosyn dosing.  Plan: PICC line was placed 10/23 AM and antibiotics resumed.   Will continue Zosyn 3.375 g EI q 8 hours and vancomycin 1000 mg iv q 8 hours with stable renal funciton. Vancomycin trough scheduled for 10/24 for a goal of 15-20 mcg/ml.   Height: 5\' 9"  (175.3 cm) Weight: 95 lb (43.1 kg) IBW/kg (Calculated) : 70.7  Temp (24hrs), Avg:98.2 F (36.8 C), Min:98 F (36.7 C), Max:98.4 F (36.9 C)   Recent Labs Lab 02/14/17 1941 02/14/17 2145 02/15/17 0328 02/16/17 2350 02/18/17 0027  WBC 13.2*  --  8.2  --   --   CREATININE 0.60*  --  0.43*  --  0.46*  LATICACIDVEN 2.1* 0.7  --   --   --   VANCOTROUGH  --   --   --  4* <4*    Estimated Creatinine Clearance: 74.1 mL/min (A) (by C-G formula based on SCr of 0.46 mg/dL (L)).    Allergies  Allergen Reactions  . Orange Fruit [Citrus] Other (See Comments)    blisters    Antimicrobials this admission: Vancomycin/Zosyn 10/19 >>  Dose adjustments this admission:  Microbiology results: 10/19  BCx: NGTD 10/19  UCx: E coli 10/19 MRSA PCR: positives 10/22  Wound cx: GNR, GPC in clusters, GPR  Thank you for allowing pharmacy to be a part of this patient's care.  Luisa HartScott Lennin Osmond, PharmD Clinical Pharmacist  02/18/2017

## 2017-02-18 NOTE — [place] FL2 (Signed)
[place] MEDICAID FL2 LEVEL OF CARE SCREENING TOOL     IDENTIFICATION  Patient Name: Jonathon York Birthdate: 04-Sep-1975 Sex: male Admission Date (Current Location): 02/14/2017  [place] and [place] Number:  Chiropodist and Address:  [facility], [address] [place], [place] [address]      Provider Number: [id number]  Attending Physician Name and Address:  Enedina Finner, MD  Relative Name and Phone Number:       Current Level of Care: Hospital Recommended Level of Care: Skilled Nursing Facility Prior Approval Number:    Date Approved/Denied:   PASRR Number: [id number]  Discharge Plan: SNF    Current Diagnoses: Patient Active Problem List   Diagnosis Date Noted  . 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 Protein-calorie malnutrition, severe 11/20/2016  . Herpes simplex infection of penis   . Decubitus ulcer 11/19/2016  . 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 Decubitus ulcer of sacral region   . Sepsis (HCC) 04/15/2016  . 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 Pressure ulcer stage III 04/30/2015  . Sterile pyuria 04/30/2015  . Paraplegia (HCC) 04/30/2015  . Neurogenic bladder 04/30/2015  . 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 Malnutrition of moderate degree 04/28/2015    Orientation RESPIRATION BLADDER Height & Weight     Self, Time, Situation, Place  Normal Continent Weight: 95 lb (43.1 kg) Height:  5\' 9"  (175.3 cm)  BEHAVIORAL SYMPTOMS/MOOD NEUROLOGICAL BOWEL NUTRITION STATUS   (none)  (none) Continent Diet (regular)  AMBULATORY STATUS COMMUNICATION OF NEEDS Skin   Total Care Verbally PU Stage and Appropriate Care                       Personal Care Assistance Level of Assistance  Total care       Total Care Assistance: Maximum assistance   Functional Limitations Info   (no issues)          SPECIAL CARE FACTORS  FREQUENCY                       Contractures Contractures Info: Not present    Additional Factors Info  Code Status Code Status Info: full             Current Medications (02/18/2017):  This is the current hospital active medication list Current Facility-Administered Medications  Medication Dose Route Frequency Provider Last Rate Last Dose  . 0.9 %  sodium chloride infusion   Intravenous PRN Enedina Finner, MD   Stopped at 02/18/17 1216  . acetaminophen (TYLENOL) tablet 650 mg  650 mg Oral Q6H PRN Oralia Manis, MD   650 mg at 02/17/17 1637   Or  . acetaminophen (TYLENOL) suppository 650 mg  650 mg Rectal Q6H PRN Oralia Manis, MD      . Chlorhexidine Gluconate Cloth 2 % PADS 6 each  6 each Topical Q0600 Oralia Manis, MD   6 each at 02/17/17 [phone]  . enoxaparin (LOVENOX) injection 30 mg  30 mg Subcutaneous Q24H Malena Peer D, RPH   30 mg at 02/17/17 2241  . feeding supplement (ENSURE ENLIVE) (ENSURE ENLIVE) liquid 237 mL  237 mL Oral BID BM Enedina Finner, MD   237 mL at 02/18/17 1024  . HYDROcodone-acetaminophen (NORCO/VICODIN) 5-325 MG per tablet 1-2 tablet  1-2 tablet Oral Q4H PRN Oralia Manis, MD   2 tablet at 02/18/17 1012  .  Influenza vac split quadrivalent PF (FLUARIX) injection 0.5 mL  0.5 mL Intramuscular Tomorrow-1000 Oralia ManisWillis, David, MD      . iopamidol (ISOVUE-300) 61 % injection 30 mL  30 mL Oral Once PRN Oralia ManisWillis, David, MD      . megestrol (MEGACE) 40 MG/ML suspension 800 mg  800 mg Oral Daily Oralia ManisWillis, David, MD   800 mg at 02/18/17 1013  . multivitamin with minerals tablet 1 tablet  1 tablet Oral Daily Enedina FinnerPatel, Sona, MD   1 tablet at 02/18/17 1013  . mupirocin ointment (BACTROBAN) 2 % 1 application  1 application Nasal BID Oralia ManisWillis, David, MD   1 application at 02/18/17 1024  . nicotine (NICODERM CQ - dosed in mg/24 hr) patch 7 mg  7 mg Transdermal Daily Oralia ManisWillis, David, MD   7 mg at 02/18/17 1012  . nutrition supplement (JUVEN) (JUVEN) powder packet 1 packet  1 packet Oral  BID WC Enedina FinnerPatel, Sona, MD   1 packet at 02/17/17 1856  . ondansetron (ZOFRAN) tablet 4 mg  4 mg Oral Q6H PRN Oralia ManisWillis, David, MD       Or  . ondansetron [facility](ZOFRAN) injection 4 mg  4 mg Intravenous Q6H PRN Oralia ManisWillis, David, MD   4 mg at 02/15/17 1306  . oxybutynin (DITROPAN) tablet 15 mg  15 mg Oral BID Oralia ManisWillis, David, MD   15 mg at 02/18/17 1021  . piperacillin-tazobactam (ZOSYN) IVPB 3.375 g  3.375 g Intravenous Willow OraQ8H Willis, David, MD 12.5 mL/hr at 02/18/17 1408 3.375 g at 02/18/17 1408  . promethazine (PHENERGAN) tablet 12.5 mg  12.5 mg Oral Q6H PRN Enedina FinnerPatel, Sona, MD   12.5 mg at 02/18/17 1050  . sodium chloride flush (NS) 0.9 % injection 10-40 mL  10-40 mL Intracatheter Q12H Enedina FinnerPatel, Sona, MD   10 mL at 02/18/17 1025  . sodium chloride flush (NS) 0.9 % injection 10-40 mL  10-40 mL Intracatheter PRN Enedina FinnerPatel, Sona, MD      . sodium hypochlorite (DAKIN'S 1/4 STRENGTH) topical solution   Irrigation BID Enedina FinnerPatel, Sona, MD      . vancomycin (VANCOCIN) IVPB 1000 mg/200 mL premix  1,000 mg Intravenous Q8H Enedina FinnerPatel, Sona, MD   Stopped at 02/18/17 1134  . vitamin C (ASCORBIC ACID) tablet 500 mg  500 mg Oral BID Enedina FinnerPatel, Sona, MD   500 mg at 02/18/17 1012  . zinc sulfate capsule 220 mg  220 mg Oral Daily Enedina FinnerPatel, Sona, MD   220 mg at 02/18/17 1013     Discharge Medications: Please see discharge summary for a list of discharge medications.  Relevant Imaging Results:  Relevant Lab Results:   Additional Information [id number]  York SpanielMonica Rosario Kushner, LCSW

## 2017-02-18 NOTE — Care Management Important Message (Signed)
Important Message  Patient Details  Name: Jonathon York MRN: [id number] Date of Birth: December 11, 1975   Medicare Important Message Given:  Yes    Chapman FitchBOWEN, Tian Davison T, RN 02/18/2017, 12:11 PM

## 2017-02-18 NOTE — Care Management (Signed)
Notified by Richardean ChimeraLoury from [facility] that case could not be accepted.  CSW notifed to purse SNF placement.  Patient updated.

## 2017-02-18 NOTE — Progress Notes (Signed)
Peripherally Inserted Central Catheter/Midline Placement  The IV Nurse has discussed with the patient and/or persons authorized to consent for the patient, the purpose of this procedure and the potential benefits and risks involved with this procedure.  The benefits include less needle sticks, lab draws from the catheter, and the patient may be discharged home with the catheter. Risks include, but not limited to, infection, bleeding, blood clot (thrombus formation), and puncture of an artery; nerve damage and irregular heartbeat and possibility to perform a PICC exchange if needed/ordered by physician.  Alternatives to this procedure were also discussed.  Bard Power PICC patient education guide, fact sheet on infection prevention and patient information card has been provided to patient /or left at bedside.    PICC/Midline Placement Documentation  PICC Single Lumen 02/18/17 PICC Left Basilic 39 cm 0 cm (Active)  Indication for Insertion or Continuance of Line Home intravenous therapies (PICC only) 02/18/2017  9:00 AM  Exposed Catheter (cm) 0 cm 02/18/2017  9:00 AM  Dressing Change Due 02/25/17 02/18/2017  9:00 AM     PICC Double Lumen 11/21/16 PICC Right Basilic 38 cm 0 cm (Active)       Franne GripNewman, Shamiyah Ngu Renee 02/18/2017, 9:29 AM

## 2017-02-18 NOTE — Progress Notes (Signed)
[place] INFECTIOUS DISEASE PROGRESS NOTE Date of Admission:  02/14/2017     ID: Jonathon York is a 41 y.o. male with decubitus ulcers Principal Problem:   Sepsis ([place]) Active Problems:   Paraplegia ([place])   Neurogenic bladder   Decubitus ulcer of sacral region   Severe recurrent major depression without psychotic features ([place])  Subjective: No change in status. Picc placed  ROS  Eleven systems are reviewed and negative except per hpi  Medications:  Antibiotics Given (last 72 hours)    Date/Time Action Medication Dose Rate   02/15/17 1334 New Bag/Given   piperacillin-tazobactam (ZOSYN) IVPB 3.375 g 3.375 g 12.5 mL/hr   02/15/17 1335 New Bag/Given   vancomycin (VANCOCIN) 500 mg in sodium chloride 0.9 % 100 mL IVPB 500 mg 100 mL/hr   02/15/17 2122 New Bag/Given   piperacillin-tazobactam (ZOSYN) IVPB 3.375 g 3.375 g 12.5 mL/hr   02/16/17 0045 New Bag/Given   vancomycin (VANCOCIN) 500 mg in sodium chloride 0.9 % 100 mL IVPB 500 mg 100 mL/hr   02/16/17 0605 New Bag/Given   piperacillin-tazobactam (ZOSYN) IVPB 3.375 g 3.375 g 12.5 mL/hr   02/16/17 1311 New Bag/Given   piperacillin-tazobactam (ZOSYN) IVPB 3.375 g 3.375 g 12.5 mL/hr   02/16/17 1311 New Bag/Given   vancomycin (VANCOCIN) 500 mg in sodium chloride 0.9 % 100 mL IVPB 500 mg 100 mL/hr   02/16/17 2314 New Bag/Given   piperacillin-tazobactam (ZOSYN) IVPB 3.375 g 3.375 g 12.5 mL/hr   02/17/17 0103 New Bag/Given   vancomycin (VANCOCIN) 500 mg in sodium chloride 0.9 % 100 mL IVPB 500 mg 100 mL/hr   02/18/17 1034 New Bag/Given   vancomycin (VANCOCIN) IVPB 1000 mg/200 mL premix 1,000 mg 200 mL/hr     . Chlorhexidine Gluconate Cloth  6 each Topical Q0600  . enoxaparin (LOVENOX) injection  30 mg Subcutaneous Q24H  . feeding supplement (ENSURE ENLIVE)  237 mL Oral BID BM  . Influenza vac split quadrivalent PF  0.5 mL Intramuscular Tomorrow-1000  . megestrol  800 mg Oral Daily  . multivitamin with minerals  1 tablet  Oral Daily  . mupirocin ointment  1 application Nasal BID  . nicotine  7 mg Transdermal Daily  . nutrition supplement (JUVEN)  1 packet Oral BID WC  . oxybutynin  15 mg Oral BID  . sodium chloride flush  10-40 mL Intracatheter Q12H  . vitamin C  500 mg Oral BID  . zinc sulfate  220 mg Oral Daily    Objective: Vital signs in last 24 hours: Temp:  [98 F (36.7 C)-98.4 F (36.9 C)] 98.1 F (36.7 C) (10/23 1253) Pulse Rate:  [81-98] 98 (10/23 1253) Resp:  [20] 20 (10/23 0403) BP: (89-91)/(48-54) 91/54 (10/23 1253) SpO2:  [99 %-100 %] 100 % (10/23 1253) Constitutional: He is oriented to person, place, and time. Thin, muscular upper bod HENT: anicteric Mouth/Throat: Oropharynx is clear. No oropharyngeal exudate.  Cardiovascular: Normal rate, regular rhythm and normal heart sounds. \Pulmonary/Chest: Effort normal and breath sounds normal. No respiratory distress. He has no wheezes.  Abdominal: Soft. Bowel sounds are normal. He exhibits no distension. There is no tenderness.  Lymphadenopathy: He has no cervical adenopathy.  Neurological: He is alert and oriented to person, place, and time. LE paraplegia Skin: very large but relatively shallow sacral ulcer, with exposed muscle but clean base. No exposed bone. L ischial wound 4x 5 cm with some undermining, exposed bone.  Psychiatric: He has a normal mood and affect. His behavior is  normal.   Lab Results No results for input(s): WBC, HGB, HCT, NA, K, CL, CO2, BUN, CREATININE, GLU in the last 72 hours.  Invalid input(s): PLATELETS  Microbiology: Results for orders placed or performed during the hospital encounter of 02/14/17  Blood Culture (routine x 2)     Status: None (Preliminary result)   Collection Time: 02/14/17  7:39 PM  Result Value Ref Range Status   Specimen Description BLOOD RIGHT ASSIST CONTROL  Final   Special Requests   Final    BOTTLES DRAWN AEROBIC AND ANAEROBIC Blood Culture adequate volume   Culture NO GROWTH 4 DAYS   Final   Report Status PENDING  Incomplete  Blood Culture (routine x 2)     Status: None (Preliminary result)   Collection Time: 02/14/17  7:46 PM  Result Value Ref Range Status   Specimen Description BLOOD LEFT FOREARM  Final   Special Requests   Final    BOTTLES DRAWN AEROBIC AND ANAEROBIC Blood Culture adequate volume   Culture NO GROWTH 4 DAYS  Final   Report Status PENDING  Incomplete  Urine culture     Status: Abnormal   Collection Time: 02/14/17  9:45 PM  Result Value Ref Range Status   Specimen Description URINE, RANDOM  Final   Special Requests NONE  Final   Culture >=100,000 COLONIES/mL ESCHERICHIA COLI (A)  Final   Report Status 02/17/2017 FINAL  Final   Organism ID, Bacteria ESCHERICHIA COLI (A)  Final      Susceptibility   Escherichia coli - MIC*    AMPICILLIN <=2 SENSITIVE Sensitive     CEFAZOLIN <=4 SENSITIVE Sensitive     CEFTRIAXONE <=1 SENSITIVE Sensitive     CIPROFLOXACIN <=0.25 SENSITIVE Sensitive     GENTAMICIN <=1 SENSITIVE Sensitive     IMIPENEM <=0.25 SENSITIVE Sensitive     NITROFURANTOIN <=16 SENSITIVE Sensitive     TRIMETH/SULFA <=20 SENSITIVE Sensitive     AMPICILLIN/SULBACTAM <=2 SENSITIVE Sensitive     PIP/TAZO <=4 SENSITIVE Sensitive     Extended ESBL NEGATIVE Sensitive     * >=100,000 COLONIES/mL ESCHERICHIA COLI  MRSA PCR Screening     Status: Abnormal   Collection Time: 02/14/17 11:23 PM  Result Value Ref Range Status   MRSA by PCR POSITIVE (A) NEGATIVE Final    Comment:        The GeneXpert MRSA Assay (FDA approved for NASAL specimens only), is one component of a comprehensive MRSA colonization surveillance program. It is not intended to diagnose MRSA infection nor to guide or monitor treatment for MRSA infections. RESULT CALLED TO, READ BACK BY AND VERIFIED WITH: STEVEN SYKES AT 0120 ON 02/15/17 RWW   Aerobic Culture (superficial specimen)     Status: None (Preliminary result)   Collection Time: 02/17/17  3:00 PM  Result Value Ref  Range Status   Specimen Description WOUND  Final   Special Requests Normal  Final   Gram Stain   Final    NO WBC SEEN NO SQUAMOUS EPITHELIAL CELLS SEEN FEW GRAM NEGATIVE RODS FEW GRAM POSITIVE RODS RARE GRAM POSITIVE COCCI IN CLUSTERS    Culture   Final    CULTURE REINCUBATED FOR BETTER GROWTH Performed at [facility], [place] [address], [place], [place] [address]    Report Status PENDING  Incomplete    Studies/Results: No results found.  Assessment/Plan: Jonathon York is a 41 y.o. malewith paraplegia and a large sacral ulcer present for several years as well as newer  L ischial wound admitted with fevers, leukocytosis, infection of his wounds.  CT shows changes of osteomyelitis.  HHe will need extensive long term care to heal this wound. For now would benefit from 4-6 weeks IV abx to treat infection, try to encourage healing and prepare prior to evaluation by plastic surgery for potential flap.  I had seen him in 03/2016 and he was dced on IV zosyn and was supposed to followup with me but never did.  Currently CRP 10.3, esr 127 Recommendations I have cultured wound and will base abx on this.  Continue vanco and zosyn for now.  Once I have seen In fu will work to get him into [facility] for evaluation Advised him to avoid tobacco which will make healing worse Advised increase protein intake with protein shakes or powder.  Thank you very much for the consult. Will follow with you.  Leonel Ramsay   02/18/2017, 1:17 PM

## 2017-02-18 NOTE — Consult Note (Signed)
WOC consulted for question related to Dakin's orders, extended orders for bedside nurse. Will ask Clydie BraunKaren to reassess for further use later this week.   Tray Klayman [facility] MSN,RN,CWOCN, CNS, [employer] [phone]

## 2017-02-18 NOTE — Clinical Social Work Note (Signed)
Clinical Social Work Assessment  Patient Details  Name: Jonathon York MRN: [id number] Date of Birth: 12-29-1975  Date of referral:  02/18/17               Reason for consult:  Facility Placement                Permission sought to share information with:  Oceanographeracility Contact Representative Permission granted to share information::  Yes, Verbal Permission Granted  Name::        Agency::     Relationship::     Contact Information:     Housing/Transportation Living arrangements for the past 2 months:  Skilled Nursing Facility, Single Family Home Source of Information:  Patient Patient Interpreter Needed:  None Criminal Activity/Legal Involvement Pertinent to Current Situation/Hospitalization:  No - Comment as needed Significant Relationships:  Siblings Lives with:  Self Do you feel safe going back to the place where you live?  Yes Need for family participation in patient care:  Yes (Comment)  Care giving concerns:  Patient typically resides at home but [facility] [place] and [facility] because he was getting sicker and the facility was not addressing it.    Social Worker assessment / plan:  Patient will require IV ABX and wound care. Patient was denied by LTAC this time due to new medicare regulations. Patient is agreeable to SNF and doesn't really have a preference. He stated he has heard that [facility] is a relatively new facility. CSW has extended the current bed offers.   Employment status:  Disabled (Comment on whether or not currently receiving Disability) Insurance information:  Medic[place], [place] Recommendations:    Information / Referral to community resources:     Patient/Family's Response to care:  Patient expressed appreciation for CSW assistance.  Patient/Family's Understanding of and Emotional Response to Diagnosis, Current Treatment, and Prognosis:  Patient disappointed [facility]  Emotional Assessment Appearance:  Appears stated age Attitude/Demeanor/Rapport:   (pleasant and cooperative) Affect (typically observed):  Accepting, Adaptable, Calm Orientation:  Oriented to Self, Oriented to Place, Oriented to  Time, Oriented to Situation Alcohol / Substance use:  Not Applicable Psych involvement (Current and /or in the community):  No (Comment)  Discharge Needs  Concerns to be addressed:  Care Coordination Readmission within the last 30 days:  No Current discharge risk:  None Barriers to Discharge:  No Barriers Identified   York SpanielMonica Damon Hargrove, LCSW 02/18/2017, 2:58 PM

## 2017-02-18 NOTE — Care Management Important Message (Signed)
Important Message  Patient Details  Name: Jonathon LeedsRichard L York MRN: [id number] Date of Birth: 08/29/1975   Medicare Important Message Given:  Yes    Chapman FitchBOWEN, Cherl Gorney T, RN 02/18/2017, 12:12 PM

## 2017-02-18 NOTE — Progress Notes (Signed)
[facility] [place] at [facility]   PATIENT NAME: Jonathon York    MR#:  [id number]  DATE OF BIRTH:  [birth year]  SUBJECTIVE:  Patient presented with foul-smelling discharge from his decubitus. He has some nausea. Eating better now No fever  REVIEW OF SYSTEMS:   Review of Systems  Constitutional: Positive for weight loss. Negative for chills and fever.  HENT: Negative for ear discharge, ear pain and nosebleeds.   Eyes: Negative for blurred vision, pain and discharge.  Respiratory: Negative for sputum production, shortness of breath, wheezing and stridor.   Cardiovascular: Negative for chest pain, palpitations, orthopnea and PND.  Gastrointestinal: Positive for nausea. Negative for abdominal pain, diarrhea and vomiting.  Genitourinary: Negative for frequency and urgency.  Musculoskeletal: Negative for back pain and joint pain.  Skin:       Chronic stage IV ulcers  Neurological: Negative for sensory change, speech change, focal weakness and weakness.  Psychiatric/Behavioral: Negative for depression and hallucinations. The patient is not nervous/anxious.    Tolerating Diet: Some Tolerating PT: Bedbound secondary to paraplegia  DRUG ALLERGIES:   Allergies  Allergen Reactions  . Orange Fruit [Citrus] Other (See Comments)    blisters    VITALS:  Blood pressure (!) 91/54, pulse 98, temperature 98.1 F (36.7 C), temperature source Oral, resp. rate 20, height 5\' 9"  (1.753 m), weight 43.1 kg (95 lb), SpO2 100 %.  PHYSICAL EXAMINATION:   Physical Exam  GENERAL:  41 y.o.-year-old patient lying in the bed with no acute distress.  EYES: Pupils equal, round, reactive to light and accommodation. No scleral icterus. Extraocular muscles intact.  HEENT: Head atraumatic, normocephalic. Oropharynx and nasopharynx clear.  NECK:  Supple, no jugular venous distention. No thyroid enlargement, no tenderness.  LUNGS: Normal breath sounds bilaterally, no wheezing,  rales, rhonchi. No use of accessory muscles of respiration.  CARDIOVASCULAR: S1, S2 normal. No murmurs, rubs, or gallops.  ABDOMEN: Soft, nontender, nondistended. Bowel sounds present. No organomegaly or mass.  EXTREMITIES: No cyanosis, clubbing or edema b/l.   Chronic atrophy Severe decubitus++ with dressing NEUROLOGIC: Functional paraplegia PSYCHIATRIC:  patient is alert and oriented x 3.  SKIN: Stage IV decubitus chronic  LABORATORY PANEL:  CBC  Recent Labs Lab 02/15/17 0328  WBC 8.2  HGB 8.2*  HCT 25.9*  PLT 491*    Chemistries   Recent Labs Lab 02/14/17 1941 02/15/17 0328  NA 137 139  K 4.0 3.3*  CL 101 109  CO2 25 25  GLUCOSE 110* 121*  BUN 11 9  CREATININE 0.60* 0.43*  CALCIUM 8.7* 7.7*  AST 21  --   ALT 29  --   ALKPHOS 134*  --   BILITOT 0.3  --    Cardiac Enzymes  Recent Labs Lab 02/14/17 1941  TROPONINI <0.03   RADIOLOGY:  No results found. ASSESSMENT AND PLAN:   Jonathon York  is a 41 y.o. male who presents with complaint of malaise and foul smell from his decubitus ulcers. He came in to the ED and was found to meet sepsis criteria with tachycardia, fever, leukocytosis  1. 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  PICC line placed.  2. Nausea Prn zofran -prn phenergan  3. Bladder dysfunction -has chronic foley cath +  4. Failure to thrive With severe protein calorie malnutrition  -Dietitian to see  5. Misc: lovenox for dvt proph  6. Functional paraplegia secondary to spinal cord injury  CSW/CM for d/c planning LTAC versus rehab  Case discussed with Care  Management/Social Worker. Management plans discussed with the patient, family and they are in agreement.  CODE STATUS: full  DVT Prophylaxis: lovenox  TOTAL TIME TAKING CARE OF THIS PATIENT: *30* minutes.  >50% time spent on counselling and coordination of care  POSSIBLE D/C IN few DAYS, DEPENDING ON CLINICAL CONDITION.  Note: This dictation was prepared with Dragon dictation along with smaller phrase technology. Any transcriptional errors that result from this process are unintentional.  Jonathon York M.D on 02/18/2017 at 2:16 PM  Between 7am to 6pm - Pager - [phone]  After 6pm go to [email or web address] - Social research officer, governmentpassword [email or web address]  [facility]  Office  [phone]  CC: Primary care physician; Jonathon HumphreysMebane, Jonathon York Primary CarePatient ID: Jonathon York, male   DOB: 01-23-1976, 41 y.o.   MRN: [id number]

## 2017-02-18 NOTE — Care Management (Signed)
Patient admitted for sepsis due to infect ulcer.  Patient lives at home alone.  Hx of paraplegia. Patient is mobile with WC.  Patient states that he was discharged from [facility] at the end of Aug 2018, and [place]  Patient states that he signed him self out on Oct 5th, and states "they weren't giving me the care I needed, and I was getting sicker". Patient will require IV antibiotics at discharge.  Patient would like to pursue LTACH transfer for IV antibiotics and wound care.  Referral was made to [facility] 02/17/17.  See previous note.  RNCM following

## 2017-02-18 NOTE — Progress Notes (Signed)
Pharmacy Antibiotic Note  Jonathon York is a 41 y.o. male admitted on 02/14/2017 with sepsis.  Pharmacy has been consulted for vancomycin/Zosyn dosing.  Plan: Vancomycin 500 mg IV every 12 hours.  Goal trough 15-20 mcg/mL. Zosyn 3.375g IV q8h (4 hour infusion).  Trough before 4th dose of regimen.   10/21:  VT @ 23:50 = 4 mcg/mL.   Pt appears to have received all scheduled doses on time.   Will increase to Vancomycin 1 gm IV Q8H to start on 10/22 @ 0900. Will recheck VT before 3rd new dose on 10/23 @ 00:30.   10/23 @ 0027 VT < 4 subtherapeutic. Patient lost IV access on 10/22 and has not received any abx doses since 10/21. PICC is planned for 10/23. Please f/u on line placement and re-initiation of abx.  Vanc regimen will have to be started over: Ke 0.0659 T1/2 10.5 ~ 12 hrs MD: 750 mg    Height: 5\' 9"  (175.3 cm) Weight: 95 lb (43.1 kg) IBW/kg (Calculated) : 70.7  Temp (24hrs), Avg:98.3 F (36.8 C), Min:98.1 F (36.7 C), Max:98.4 F (36.9 C)   Recent Labs Lab 02/14/17 1941 02/14/17 2145 02/15/17 0328 02/16/17 2350 02/18/17 0027  WBC 13.2*  --  8.2  --   --   CREATININE 0.60*  --  0.43*  --   --   LATICACIDVEN 2.1* 0.7  --   --   --   VANCOTROUGH  --   --   --  4* <4*    Estimated Creatinine Clearance: 74.1 mL/min (A) (by C-G formula based on SCr of 0.43 mg/dL (L)).    Allergies  Allergen Reactions  . Orange Fruit [Citrus] Other (See Comments)    blisters    Antimicrobials this admission: Vancomycin/Zosyn 10/19 >>  Dose adjustments this admission:   Microbiology results: 10/19  BCx: sent 10/19  UCx: sent    Thank you for allowing pharmacy to be a part of this patient's care.  Thomasene Rippleavid Jennipher Weatherholtz, PharmD, BCPS Clinical Pharmacist 02/18/2017

## 2017-02-19 LAB — CULTURE, BLOOD (ROUTINE X 2)
Culture: NO GROWTH
Culture: NO GROWTH
SPECIAL REQUESTS: ADEQUATE
SPECIAL REQUESTS: ADEQUATE

## 2017-02-19 LAB — CREATININE, SERUM
Creatinine, Ser: 0.39 mg/dL — ABNORMAL LOW (ref 0.61–1.24)
GFR calc non Af Amer: >60 mL/min (ref >60)

## 2017-02-19 MED ORDER — ASCORBIC ACID 500 MG PO TABS: 500.0000 mg | ORAL_TABLET | Freq: Two times a day (BID) | ORAL | 1 refills | Status: DC

## 2017-02-19 MED ORDER — PIPERACILLIN-TAZOBACTAM 3.375 G IVPB: 3.3750 g | Freq: Three times a day (TID) | INTRAVENOUS | 0 refills | Status: DC

## 2017-02-19 MED ORDER — PIPERACILLIN-TAZOBACTAM 3.375 G IVPB: 3.3750 g | Freq: Three times a day (TID) | INTRAVENOUS | 0 refills | Status: AC

## 2017-02-19 MED ORDER — MEGESTROL ACETATE 40 MG/ML PO SUSP: 800.0000 mg | Freq: Every day | ORAL | 0 refills | Status: DC

## 2017-02-19 MED ORDER — VANCOMYCIN HCL IN DEXTROSE 1-5 GM/200ML-% IV SOLN
1000.0000 mg | Freq: Three times a day (TID) | INTRAVENOUS | 0 refills | Status: AC
Start: 2017-02-19 — End: 2017-03-31

## 2017-02-19 MED ORDER — HYDROCODONE-ACETAMINOPHEN 5-325 MG PO TABS: 1.0000 | ORAL_TABLET | Freq: Four times a day (QID) | ORAL | 0 refills | Status: DC | PRN

## 2017-02-19 MED ORDER — VANCOMYCIN HCL IN DEXTROSE 1-5 GM/200ML-% IV SOLN: 1000.0000 mg | Freq: Three times a day (TID) | INTRAVENOUS | 0 refills | Status: DC

## 2017-02-19 NOTE — Progress Notes (Signed)
Pt released to EMS, given discharge instructions and VS normal. He will be transported to [facility]. PICC line dressing clean, flushed and intact.

## 2017-02-19 NOTE — Discharge Summary (Signed)
[facility] -  at [facility]   PATIENT NAME: Jonathon York    MR#:  [id number]  DATE OF BIRTH:  [birth year]  DATE OF ADMISSION:  02/14/2017 ADMITTING PHYSICIAN: Oralia Manis, MD  DATE OF DISCHARGE: 02/19/2017  PRIMARY CARE PHYSICIAN: [place]    ADMISSION DIAGNOSIS:  Sepsis, due to unspecified organism (HCC) [A41.9]  DISCHARGE DIAGNOSIS:  Sepsis on admission--resolved Stage 5 severe Decubitus ulcers with Osteomyelitis acute on chronic--PICC line with IV abxs  SECONDARY DIAGNOSIS:   Past Medical History:  Diagnosis Date  . Anxiety   . Decubitus ulcer   . Depression   . Neurogenic bladder   . Osteomyelitis (HCC)   . Paraparesis of both lower limbs (HCC) 02/21/00    HOSPITAL COURSE:  RichardWilsonis a 41 y.o.malewho presents with complaint of malaise and foul smell from his decubitus ulcers. He came in to the ED and was found to meet sepsis criteria with tachycardia, fever, leukocytosis  1. 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  PICC line placed.  2. Nausea Prn zofran -prn phenergan  3. Bladder dysfunction -has chronic foley cath +  4. Failure to thrive With severe protein calorie malnutrition  -Dietitian to see  5. Misc: lovenox for dvt proph  6. Functional paraplegia secondary to spinal cord injury  CSW for d/c planning to [facility]  WOC wound follow up Wound type: Chronic nonhealing wounds to sacrum and left ischium.  Has completed three days of Dakins and wounds are improved in odor,  friability and debridement. Measurement: unchanged but no yellow exudate or odor noted.  Wound WUJ:WJXB pink nongranulating.  Drainage (amount, consistency, odor) minimal serosanguinous  nO odor Periwound:maceration has improved  Dressing procedure/placement/frequency:Discontinue Dakin's and begin Cleanse wounds to sacrum and ischium with NS.  Apply Aquacel Ag to wound bed.  Cover with Dry 4x4 gauze and ABD pad/tape.  Change M/W/F   CONSULTS OBTAINED:  Treatment Team:  Mick Sell, MD  DRUG ALLERGIES:   Allergies  Allergen Reactions  . Orange Fruit [Citrus] Other (See Comments)    blisters    DISCHARGE MEDICATIONS:   Current Discharge Medication List    START taking these medications   Details  piperacillin-tazobactam (ZOSYN) 3.375 GM/50ML IVPB Inject 50 mLs (3.375 g total) into the vein every 8 (eight) hours. Qty: 50 mL, Refills: 0    vancomycin (VANCOCIN) 1-5 GM/200ML-% SOLN Inject 200 mLs (1,000 mg total) into the vein every 8 (eight) hours. Qty: 4000 mL, Refills: 0    vitamin C (VITAMIN C) 500 MG tablet Take 1 tablet (500 mg total) by mouth 2 (two) times daily. Qty: 60 tablet, Refills: 1      CONTINUE these medications which have CHANGED   Details  HYDROcodone-acetaminophen (NORCO/VICODIN) 5-325 MG tablet Take 1-2 tablets by mouth every 6 (six) hours as needed for moderate pain. Qty: 20 tablet, Refills: 0    megestrol (MEGACE) 40 MG/ML suspension Take 20 mLs (800 mg total) by mouth daily. Qty: 240 mL, Refills: 0      CONTINUE these medications which have NOT CHANGED   Details  Amino Acids-Protein Hydrolys (FEEDING SUPPLEMENT,  PRO-STAT SUGAR FREE 64,) LIQD Take 30 mLs by mouth 2 (two) times daily. Qty: 900 mL, Refills: 0    feeding supplement, ENSURE ENLIVE, (ENSURE ENLIVE) LIQD Take 237 mLs by mouth 2 (two) times daily between meals. Qty: 237 mL, Refills: 12    Multiple Vitamin (MULTIVITAMIN WITH MINERALS) TABS tablet Take 1 tablet by mouth daily. Qty:  30 tablet, Refills: 0    nicotine (NICODERM CQ - DOSED IN MG/24 HR) 7 mg/24hr patch Place 1 patch (7 mg total) onto the skin daily. Qty: 28 patch, Refills: 0    oxybutynin (DITROPAN) 5 MG tablet Take 15 mg by mouth 2 (two) times daily.      STOP taking these medications     ibuprofen (ADVIL,MOTRIN) 200 MG tablet         If you experience worsening of your admission symptoms, develop shortness of breath, life threatening emergency, suicidal or homicidal thoughts you must seek medical attention immediately by calling 911 or calling your MD immediately  if symptoms less severe.  You Must read complete instructions/literature along with all the possible adverse reactions/side effects for all the Medicines you take and that have been prescribed to you. Take any new Medicines after you have completely understood and accept all the possible adverse reactions/side effects.   Please note  You were cared for by a hospitalist during your hospital stay. If you have any questions about your discharge medications or the care you received while you were in the hospital after you are discharged, you can call the unit and asked to speak with the hospitalist on call if the hospitalist that took care of you is not available. Once you are discharged, your primary care physician will handle any further medical issues. Please note that NO REFILLS for any discharge medications will be authorized once you are discharged, as it is imperative that you return to your primary care physician (or establish a relationship with a primary care physician if you do not have one) for your aftercare needs so that they can reassess your need for medications and monitor your lab values. Today   SUBJECTIVE   Doing ok  VITAL SIGNS:  Blood pressure (!) 101/54, pulse 78, temperature 97.7 F (36.5 C), temperature source Oral, resp. rate 18, height 5\' 9"  (1.753 m), weight 43.1 kg (95 lb), SpO2 93 %.  I/O:    Intake/Output  Summary (Last 24 hours) at 02/19/17 1053 Last data filed at 02/19/17 0631  Gross per 24 hour  Intake              215 ml  Output             1300 ml  Net            -1085 ml    PHYSICAL EXAMINATION:  GENERAL:  41 y.o.-year-old patient lying in the bed with no acute distress. Cachectic, thin EYES: Pupils equal, round, reactive to light and accommodation. No scleral icterus. Extraocular muscles intact.  HEENT: Head atraumatic, normocephalic. Oropharynx and nasopharynx clear.  NECK:  Supple, no jugular venous distention. No thyroid enlargement, no tenderness.  LUNGS: Normal breath sounds bilaterally, no wheezing, rales,rhonchi or crepitation. No use of accessory muscles of respiration.  CARDIOVASCULAR: S1, S2 normal. No murmurs, rubs, or gallops.  ABDOMEN: Soft, non-tender, non-distended. Bowel sounds present. No organomegaly or mass.  EXTREMITIES: atrophied both LE NEUROLOGIC functional paraplegia  PSYCHIATRIC: The patient is alert and oriented x 3.  SKIN: stage 5 Decubitus++, chronic  DATA  REVIEW:   CBC   Recent Labs Lab 02/15/17 0328  WBC 8.2  HGB 8.2*  HCT 25.9*  PLT 491*    Chemistries   Recent Labs Lab 02/14/17 1941 02/15/17 0328  02/19/17 0920  NA 137 139  --   --   K 4.0 3.3*  --   --   CL 101 109  --   --   CO2 25 25  --   --   GLUCOSE 110* 121*  --   --   BUN 11 9  --   --   CREATININE 0.60* 0.43*  < > 0.39*  CALCIUM 8.7* 7.7*  --   --   AST 21  --   --   --   ALT 29  --   --   --   ALKPHOS 134*  --   --   --   BILITOT 0.3  --   --   --   < > = values in this interval not displayed.  Microbiology Results   Recent Results (from the past 240 hour(s))  Blood Culture (routine x 2)     Status: None   Collection Time: 02/14/17  7:39 PM  Result Value Ref Range Status   Specimen Description BLOOD RIGHT ASSIST CONTROL  Final   Special Requests   Final    BOTTLES DRAWN AEROBIC AND ANAEROBIC Blood Culture adequate volume   Culture NO GROWTH 5 DAYS  Final    Report Status 02/19/2017 FINAL  Final  Blood Culture (routine x 2)     Status: None   Collection Time: 02/14/17  7:46 PM  Result Value Ref Range Status   Specimen Description BLOOD LEFT FOREARM  Final   Special Requests   Final    BOTTLES DRAWN AEROBIC AND ANAEROBIC Blood Culture adequate volume   Culture NO GROWTH 5 DAYS  Final   Report Status 02/19/2017 FINAL  Final  Urine culture     Status: Abnormal   Collection Time: 02/14/17  9:45 PM  Result Value Ref Range Status   Specimen Description URINE, RANDOM  Final   Special Requests NONE  Final   Culture >=100,000 COLONIES/mL ESCHERICHIA COLI (A)  Final   Report Status 02/17/2017 FINAL  Final   Organism ID, Bacteria ESCHERICHIA COLI (A)  Final      Susceptibility   Escherichia coli - MIC*    AMPICILLIN <=2 SENSITIVE Sensitive     CEFAZOLIN <=4 SENSITIVE Sensitive     CEFTRIAXONE <=1 SENSITIVE Sensitive     CIPROFLOXACIN <=0.25 SENSITIVE Sensitive     GENTAMICIN <=1 SENSITIVE Sensitive     IMIPENEM <=0.25 SENSITIVE Sensitive     NITROFURANTOIN <=16 SENSITIVE Sensitive     TRIMETH/SULFA <=20 SENSITIVE Sensitive     AMPICILLIN/SULBACTAM <=2 SENSITIVE Sensitive     PIP/TAZO <=4 SENSITIVE Sensitive     Extended ESBL NEGATIVE Sensitive     * >=100,000 COLONIES/mL ESCHERICHIA COLI  MRSA PCR Screening     Status: Abnormal   Collection Time: 02/14/17 11:23 PM  Result Value Ref Range Status   MRSA by PCR POSITIVE (A) NEGATIVE Final    Comment:        The GeneXpert MRSA Assay (FDA approved for NASAL specimens only), is one component of a comprehensive MRSA colonization surveillance program. It is not intended to diagnose MRSA infection nor to guide or monitor treatment for MRSA infections. RESULT CALLED TO, READ BACK BY AND VERIFIED WITH: STEVEN SYKES AT 0120 ON 02/15/17  RWW   Aerobic Culture (superficial specimen)     Status: None (Preliminary result)   Collection Time: 02/17/17  3:00 PM  Result Value Ref Range Status    Specimen Description WOUND  Final   Special Requests Normal  Final   Gram Stain   Final    NO WBC SEEN NO SQUAMOUS EPITHELIAL CELLS SEEN FEW GRAM NEGATIVE RODS FEW GRAM POSITIVE RODS RARE GRAM POSITIVE COCCI IN CLUSTERS    Culture   Final    CULTURE REINCUBATED FOR BETTER GROWTH Performed at [facility], [address], [place], [place] [address]    Report Status PENDING  Incomplete    RADIOLOGY:  No results found.   Management plans discussed with the patient, family and they are in agreement.  CODE STATUS:     Code Status Orders        Start     Ordered   02/14/17 2322  Full code  Continuous     02/14/17 2321    Code Status History    Date Active Date Inactive Code Status Order ID Comments User Context   11/19/2016  5:20 PM 11/21/2016 10:07 PM Full Code [id number]  Auburn Bilberry, MD Inpatient   04/15/2016  6:02 PM 04/19/2016  2:11 AM Full Code [id number]  Shaune Pollack, MD Inpatient   04/29/2015 10:17 AM 04/30/2015  1:39 PM Full Code [id number]  Linus Galas, MD Inpatient   04/28/2015  4:38 AM 04/29/2015 10:17 AM Full Code [id number]  Milagros Loll, MD ED      TOTAL TIME TAKING CARE OF THIS PATIENT: *40* minutes.    Mirjana Tarleton M.D on 02/19/2017 at 10:53 AM  Between 7am to 6pm - Pager - [phone] After 6pm go to [email or web address] - Social research officer, government  [facility]  Office  [phone]  CC: Primary care physician; [facility]

## 2017-02-19 NOTE — Progress Notes (Signed)
[facility] informed her of the 3 Stage 3 pressure ulcers and the antibiotics already given this morning. He will need a private room for MRSA and the Bactroban for his nares.

## 2017-02-19 NOTE — Progress Notes (Signed)
Infectious Disease Long Term IV Antibiotic Orders Jonathon York [birth year]  Diagnosis: Infected decubitus ulcer, paraplegia  Dced to [facility]  Culture results Order Status: Completed Specimen: Wound from Wound Updated: 02/18/17 1227   Specimen Description WOUND   Special Requests Normal   Gram Stain --   NO WBC SEEN  NO SQUAMOUS EPITHELIAL CELLS SEEN  FEW GRAM NEGATIVE RODS  FEW GRAM POSITIVE RODS  RARE GRAM POSITIVE COCCI IN CLUSTERS    Culture --   CULTURE REINCUBATED FOR BETTER GROWTH  Performed at [facility], [address], [place], [place] [address]    Report Status PENDING   LABS Lab Results  Component Value Date   CREATININE 0.39 (L) 02/19/2017   Lab Results  Component Value Date   WBC 8.2 02/15/2017   HGB 8.2 (L) 02/15/2017   HCT 25.9 (L) 02/15/2017   MCV 74.8 (L) 02/15/2017   PLT 491 (H) 02/15/2017   Lab Results  Component Value Date   ESRSEDRATE 127 (H) 02/18/2017   Lab Results  Component Value Date   CRP 10.3 (H) 02/18/2017    Allergies:  Allergies  Allergen Reactions  . Orange Fruit [Citrus] Other (See Comments)    blisters    Discharge antibiotics Vancomycin   1000               mg  every      8         hours .     Goal vancomycin trough 15-20.    Pharmacy to adjust dosing based on levels Zosyn  3.375 grams every 8 hours  PICC Care per protocol Labs weekly while on IV antibiotics -FAX weekly labs to [phone] CBC w diff   Comprehensive met panel Vancomycin Trough   CRP   Planned duration of antibiotics  6 weeks   Stop date Dec 3rd  Follow up clinic date 1-2 weeks   Leonel Ramsay, MD

## 2017-02-19 NOTE — Consult Note (Addendum)
WOC Nurse wound follow up Wound type: Chronic nonhealing wounds to sacrum and left ischium.  Has completed three days of Dakins and wounds are improved in odor, friability and debridement.  Patient is curt and irritated during assessment today. Emotional support provided.  Measurement: unchanged but no yellow exudate or odor noted.  Wound WUJ:WJXBbed:pale pink nongranulating.  Drainage (amount, consistency, odor) minimal serosanguinous  nO odor Periwound:maceration has improved  Dressing procedure/placement/frequency:Discontinue Dakin's and begin Cleanse wounds to sacrum and ischium with NS.  Apply Aquacel Ag to wound bed.  Cover with Dry 4x4 gauze and ABD pad/tape.  Change M/W/F Will not follow at this time.  Please re-consult if needed.  Jonathon HudsonKaren Mairead Schwarzkopf RN BSN CWON Pager [phone]

## 2017-02-19 NOTE — Progress Notes (Signed)
Patient stated that his dressing was "done around 1300 this afternoon (10/23) and that it was usually only done once a day", that he didn't want it done again until the wound nurse came in the morning;  Informed patient that the Dakins solution was ordered twice a day, but patient insisted that he didn't want it done again this evening; will defer until the am. Windy Carinaurner,Shadai Mcclane K, RN 12:03 AM 02/19/2017

## 2017-02-19 NOTE — Clinical Social Work Placement (Signed)
   CLINICAL SOCIAL WORK PLACEMENT  NOTE  Date:  02/19/2017  Patient Details  Name: Jonathon York MRN: [id number] Date of Birth: 08/21/1975  Clinical Social Work is seeking post-discharge placement for this patient at the Skilled  Nursing Facility level of care (*CSW will initial, date and re-position this form in  chart as items are completed):  Yes   Patient/family provided with [facility] Social Work Department's list of facilities offering this level of care within the geographic area requested by the patient (or if unable, by the patient's family).  Yes   Patient/family informed of their freedom to choose among providers that offer the needed level of care, that participate in Medicare, Medicaid or managed care program needed by the patient, have an available bed and are willing to accept the patient.  Yes   Patient/family informed of [facility]'s ownership interest in [facility] and [facility], as well as of the fact that they are under no obligation to receive care at these facilities.  PASRR submitted to EDS on       PASRR number received on       Existing PASRR number confirmed on 02/18/17     FL2 transmitted to all facilities in geographic area requested by pt/family on 02/18/17     FL2 transmitted to all facilities within larger geographic area on       Patient informed that his/her managed care company has contracts with or will negotiate with certain facilities, including the following:        Yes   Patient/family informed of bed offers received.  Patient [facility] bed at  [facility]     Physician recommends and patient chooses bed at  [facility](S[facility])    Patient to be transferred to  [facility] on 02/19/17.  Patient to be transferred to facility by  (EMS)     Patient family notified on 02/19/17 of transfer.  Name of family member notified:   (patient is oriented and will notify family)     PHYSICIAN       Additional Comment:     _______________________________________________ York SpanielMonica Delma Drone, LCSW 02/19/2017, 12:59 PM

## 2017-02-19 NOTE — Progress Notes (Signed)
[name] INFECTIOUS DISEASE PROGRESS NOTE Date of Admission:  02/14/2017     ID: Jonathon York is a 41 y.o. male with decubitus ulcers Principal Problem:   Sepsis ([place]) Active Problems:   Paraplegia ([place])   Neurogenic bladder   Decubitus ulcer of sacral region   Severe recurrent major depression without psychotic features (HCC)  Subjective: Denied by [facility], going to [facility] today. No fevers.   ROS  Eleven systems are reviewed and negative except per hpi  Medications:  Antibiotics Given (last 72 hours)    Date/Time Action Medication Dose Rate   02/16/17 2314 New Bag/Given   piperacillin-tazobactam (ZOSYN) IVPB 3.375 g 3.375 g 12.5 mL/hr   02/17/17 0103 New Bag/Given   vancomycin (VANCOCIN) 500 mg in sodium chloride 0.9 % 100 mL IVPB 500 mg 100 mL/hr   02/18/17 1034 New Bag/Given   vancomycin (VANCOCIN) IVPB 1000 mg/200 mL premix 1,000 mg 200 mL/hr   02/18/17 1408 New Bag/Given   piperacillin-tazobactam (ZOSYN) IVPB 3.375 g 3.375 g 12.5 mL/hr   02/18/17 1850 New Bag/Given   vancomycin (VANCOCIN) IVPB 1000 mg/200 mL premix 1,000 mg 200 mL/hr   02/18/17 2108 New Bag/Given   piperacillin-tazobactam (ZOSYN) IVPB 3.375 g 3.375 g 12.5 mL/hr   02/19/17 0105 New Bag/Given   vancomycin (VANCOCIN) IVPB 1000 mg/200 mL premix 1,000 mg 200 mL/hr   02/19/17 0604 New Bag/Given   piperacillin-tazobactam (ZOSYN) IVPB 3.375 g 3.375 g 12.5 mL/hr   02/19/17 1002 New Bag/Given   vancomycin (VANCOCIN) IVPB 1000 mg/200 mL premix 1,000 mg 200 mL/hr   02/19/17 1335 New Bag/Given   piperacillin-tazobactam (ZOSYN) IVPB 3.375 g 3.375 g 12.5 mL/hr     . Chlorhexidine Gluconate Cloth  6 each Topical Q0600  . enoxaparin (LOVENOX) injection  30 mg Subcutaneous Q24H  . feeding supplement (ENSURE ENLIVE)  237 mL Oral BID BM  . Influenza vac split quadrivalent PF  0.5 mL Intramuscular Tomorrow-1000  . megestrol  800 mg Oral Daily  . multivitamin with minerals  1 tablet Oral Daily  . mupirocin  ointment  1 application Nasal BID  . nicotine  7 mg Transdermal Daily  . nutrition supplement (JUVEN)  1 packet Oral BID WC  . oxybutynin  15 mg Oral BID  . sodium chloride flush  10-40 mL Intracatheter Q12H  . vitamin C  500 mg Oral BID  . zinc sulfate  220 mg Oral Daily    Objective: Vital signs in last 24 hours: Temp:  [97.7 F (36.5 C)-98.9 F (37.2 C)] 98.1 F (36.7 C) (10/24 1241) Pulse Rate:  [69-94] 94 (10/24 1241) Resp:  [18-20] 18 (10/24 0421) BP: (84-101)/(48-54) 95/48 (10/24 1241) SpO2:  [93 %-100 %] 100 % (10/24 1241) Constitutional: He is oriented to person, place, and time. Thin, muscular upper bod HENT: anicteric Mouth/Throat: Oropharynx is clear. No oropharyngeal exudate.  Cardiovascular: Normal rate, regular rhythm and normal heart sounds. \Pulmonary/Chest: Effort normal and breath sounds normal. No respiratory distress. He has no wheezes.  Abdominal: Soft. Bowel sounds are normal. He exhibits no distension. There is no tenderness.  Lymphadenopathy: He has no cervical adenopathy.  Neurological: He is alert and oriented to person, place, and time. LE paraplegia Skin: very large but relatively shallow sacral ulcer, with exposed muscle but clean base. No exposed bone. L ischial wound 4x 5 cm with some undermining, exposed bone.  Psychiatric: He has a normal mood and affect. His behavior is normal.   Lab Results  Recent Labs  02/18/17 0027  02/19/17 0920  CREATININE 0.46* 0.39*    Microbiology: Results for orders placed or performed during the hospital encounter of 02/14/17  Blood Culture (routine x 2)     Status: None   Collection Time: 02/14/17  7:39 PM  Result Value Ref Range Status   Specimen Description BLOOD RIGHT ASSIST CONTROL  Final   Special Requests   Final    BOTTLES DRAWN AEROBIC AND ANAEROBIC Blood Culture adequate volume   Culture NO GROWTH 5 DAYS  Final   Report Status 02/19/2017 FINAL  Final  Blood Culture (routine x 2)     Status: None    Collection Time: 02/14/17  7:46 PM  Result Value Ref Range Status   Specimen Description BLOOD LEFT FOREARM  Final   Special Requests   Final    BOTTLES DRAWN AEROBIC AND ANAEROBIC Blood Culture adequate volume   Culture NO GROWTH 5 DAYS  Final   Report Status 02/19/2017 FINAL  Final  Urine culture     Status: Abnormal   Collection Time: 02/14/17  9:45 PM  Result Value Ref Range Status   Specimen Description URINE, RANDOM  Final   Special Requests NONE  Final   Culture >=100,000 COLONIES/mL ESCHERICHIA COLI (A)  Final   Report Status 02/17/2017 FINAL  Final   Organism ID, Bacteria ESCHERICHIA COLI (A)  Final      Susceptibility   Escherichia coli - MIC*    AMPICILLIN <=2 SENSITIVE Sensitive     CEFAZOLIN <=4 SENSITIVE Sensitive     CEFTRIAXONE <=1 SENSITIVE Sensitive     CIPROFLOXACIN <=0.25 SENSITIVE Sensitive     GENTAMICIN <=1 SENSITIVE Sensitive     IMIPENEM <=0.25 SENSITIVE Sensitive     NITROFURANTOIN <=16 SENSITIVE Sensitive     TRIMETH/SULFA <=20 SENSITIVE Sensitive     AMPICILLIN/SULBACTAM <=2 SENSITIVE Sensitive     PIP/TAZO <=4 SENSITIVE Sensitive     Extended ESBL NEGATIVE Sensitive     * >=100,000 COLONIES/mL ESCHERICHIA COLI  MRSA PCR Screening     Status: Abnormal   Collection Time: 02/14/17 11:23 PM  Result Value Ref Range Status   MRSA by PCR POSITIVE (A) NEGATIVE Final    Comment:        The GeneXpert MRSA Assay (FDA approved for NASAL specimens only), is one component of a comprehensive MRSA colonization surveillance program. It is not intended to diagnose MRSA infection nor to guide or monitor treatment for MRSA infections. RESULT CALLED TO, READ BACK BY AND VERIFIED WITH: STEVEN SYKES AT 0120 ON 02/15/17 RWW   Aerobic Culture (superficial specimen)     Status: None (Preliminary result)   Collection Time: 02/17/17  3:00 PM  Result Value Ref Range Status   Specimen Description WOUND  Final   Special Requests Normal  Final   Gram Stain   Final     NO WBC SEEN NO SQUAMOUS EPITHELIAL CELLS SEEN FEW GRAM NEGATIVE RODS FEW GRAM POSITIVE RODS RARE GRAM POSITIVE COCCI IN CLUSTERS    Culture   Final    CULTURE REINCUBATED FOR BETTER GROWTH Performed at [facility], [place] [address] [place], [place] [address]    Report Status PENDING  Incomplete    Studies/Results: No results found.  Assessment/Plan: Jonathon York is a 41 y.o. male with paraplegia and a large sacral ulcer present for several years as well as newer L ischial wound admitted with fevers, leukocytosis, infection of his wounds.  CT shows changes of osteomyelitis.  He will need  extensive long term care to heal this wound. For now would benefit from 4-6 weeks IV abx to treat infection, try to encourage healing and prepare prior to evaluation by plastic surgery for potential flap.  I had seen him in 03/2016 and he was dced on IV zosyn and was supposed to followup with me but never did.  Currently CRP 10.3, esr 127. Cxs pending so far only E coli on UCX.  Recommendations Continue vanco and zosyn for now.   See abx order sheet.  I will fu on his cxs and can dc vanco if no MRSA identified. Once I have seen In fu will work to get him into [facility] for evaluation Advised him to avoid tobacco which will make healing worse.  Thank you very much for the consult. Will follow with you.  Leonel Ramsay   02/19/2017, 1:53 PM

## 2017-02-23 LAB — AEROBIC CULTURE  (SUPERFICIAL SPECIMEN)
GRAM STAIN: NONE SEEN
SPECIAL REQUESTS: NORMAL

## 2017-02-23 LAB — AEROBIC CULTURE W GRAM STAIN (SUPERFICIAL SPECIMEN)

## 2017-12-25 ENCOUNTER — Encounter: Payer: Self-pay | Admitting: *Deleted

## 2017-12-25 ENCOUNTER — Other Ambulatory Visit: Payer: Self-pay

## 2017-12-25 ENCOUNTER — Emergency Department
Admission: EM | Admit: 2017-12-25 | Discharge: 2017-12-25 | Disposition: A | Discharge status: Home / Self Care | Payer: Medicare Other | Attending: Emergency Medicine | Admitting: Emergency Medicine

## 2017-12-25 ENCOUNTER — Emergency Department: Payer: Medicare Other

## 2017-12-25 DIAGNOSIS — L97512 Non-pressure chronic ulcer of other part of right foot with fat layer exposed: Secondary | ICD-10-CM | POA: Diagnosis not present

## 2017-12-25 DIAGNOSIS — Z89421 Acquired absence of other right toe(s): Secondary | ICD-10-CM | POA: Insufficient documentation

## 2017-12-25 DIAGNOSIS — Z87891 Personal history of nicotine dependence: Secondary | ICD-10-CM | POA: Diagnosis not present

## 2017-12-25 DIAGNOSIS — Z79899 Other long term (current) drug therapy: Secondary | ICD-10-CM | POA: Insufficient documentation

## 2017-12-25 DIAGNOSIS — R238 Other skin changes: Secondary | ICD-10-CM | POA: Diagnosis present

## 2017-12-25 DIAGNOSIS — G8220 Paraplegia, unspecified: Secondary | ICD-10-CM | POA: Insufficient documentation

## 2017-12-25 LAB — BASIC METABOLIC PANEL
Anion gap: 10 (ref 5–15)
BUN: 15 mg/dL (ref 6–20)
CHLORIDE: 106 mmol/L (ref 98–111)
CO2: 24 mmol/L (ref 22–32)
CREATININE: 0.48 mg/dL — AB (ref 0.61–1.24)
Calcium: 8.9 mg/dL (ref 8.9–10.3)
GFR calc non Af Amer: >60 mL/min (ref >60)
Glucose, Bld: 93 mg/dL (ref 70–99)
Potassium: 3.8 mmol/L (ref 3.5–5.1)
SODIUM: 140 mmol/L (ref 135–145)

## 2017-12-25 LAB — CBC WITH DIFFERENTIAL/PLATELET
BASOS ABS: 0.1 10*3/uL (ref 0–0.1)
Basophils Relative: 1 %
Eosinophils Absolute: 0.1 10*3/uL (ref 0–0.7)
Eosinophils Relative: 1 %
HEMATOCRIT: 31.5 % — AB (ref 40.0–52.0)
HEMOGLOBIN: 9.9 g/dL — AB (ref 13.0–18.0)
Lymphocytes Relative: 14 %
Lymphs Abs: 1.4 10*3/uL (ref 1.0–3.6)
MCH: 19.3 pg — ABNORMAL LOW (ref 26.0–34.0)
MCHC: 31.3 g/dL — ABNORMAL LOW (ref 32.0–36.0)
MCV: 61.8 fL — ABNORMAL LOW (ref 80.0–100.0)
MONO ABS: 0.5 10*3/uL (ref 0.2–1.0)
Monocytes Relative: 5 %
NEUTROS ABS: 8 10*3/uL — AB (ref 1.4–6.5)
NEUTROS PCT: 79 %
Platelets: 513 10*3/uL — ABNORMAL HIGH (ref 150–440)
RBC: 5.11 MIL/uL (ref 4.40–5.90)
RDW: 21.3 % — AB (ref 11.5–14.5)
WBC: 10 10*3/uL (ref 3.8–10.6)

## 2017-12-25 LAB — LACTIC ACID, PLASMA: Lactic Acid, Venous: 1.4 mmol/L (ref 0.5–1.9)

## 2017-12-25 MED ORDER — SULFAMETHOXAZOLE-TRIMETHOPRIM 800-160 MG PO TABS: 1.0000 | ORAL_TABLET | Freq: Two times a day (BID) | ORAL | 0 refills | Status: DC

## 2017-12-25 NOTE — ED Provider Notes (Signed)
[facility] Emergency Department Provider Note  ____________________________________________   I have reviewed the triage vital signs and the nursing notes. Where available I have reviewed prior notes and, if possible and indicated, outside hospital notes.    HISTORY  Chief Complaint Wound Infection    HPI Jonathon York is a 42 y.o. male with a history of paraplegia, chronic ulcers to his bottom which he manages well with at home, and chronic ulcer to his right foot.  He states that he saw something brown on the right ulcer of his right foot and thought he would come in to make sure it was not MRSA.  He has had no fever or infectious symptoms.  He otherwise feels to be at his baseline.  Past Medical History:  Diagnosis Date  . Anxiety   . Decubitus ulcer   . Depression   . Neurogenic bladder   . Osteomyelitis (HCC)   . 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 Protein-calorie malnutrition, severe 11/20/2016  . Herpes simplex infection of penis   . Decubitus ulcer 11/19/2016  . 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 Decubitus ulcer of sacral region   . Sepsis (HCC) 04/15/2016  . 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 Pressure ulcer stage III 04/30/2015  . Sterile pyuria 04/30/2015  . Paraplegia (HCC) 04/30/2015  . Neurogenic bladder 04/30/2015  . 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 Malnutrition of moderate degree 04/28/2015    Past Surgical History:  Procedure Laterality Date  . AMPUTATION TOE Right 04/29/2015   Procedure: AMPUTATION TOE;  Surgeon: Linus Galasodd Cline, MD;  Location: [facility] ORS;  Service: Podiatry;  Laterality: Right;  . IRRIGATION AND DEBRIDEMENT BUTTOCKS    . TOE AMPUTATION Right     Prior to Admission medications   Medication Sig Start Date End  Date Taking? Authorizing Provider  Amino Acids-Protein Hydrolys (FEEDING SUPPLEMENT, PRO-STAT SUGAR FREE 64,) LIQD Take 30 mLs by mouth 2 (two) times daily. 11/21/16   Altamese DillingVachhani, Vaibhavkumar, MD  feeding supplement, ENSURE ENLIVE, (ENSURE ENLIVE) LIQD Take 237 mLs by mouth 2 (two) times daily between meals. 11/21/16   Altamese DillingVachhani, Vaibhavkumar, MD  HYDROcodone-acetaminophen (NORCO/VICODIN) 5-325 MG tablet Take 1-2 tablets by mouth every 6 (six) hours as needed for moderate pain. 02/19/17   Enedina FinnerPatel, Sona, MD  megestrol (MEGACE) 40 MG/ML suspension Take 20 mLs (800 mg total) by mouth daily. 02/19/17   Enedina FinnerPatel, Sona, MD  Multiple Vitamin (MULTIVITAMIN WITH MINERALS) TABS tablet Take 1 tablet by mouth daily. 11/22/16   Altamese DillingVachhani, Vaibhavkumar, MD  nicotine (NICODERM CQ - DOSED IN MG/24 HR) 7 mg/24hr patch Place 1 patch (7 mg total) onto the skin daily. 11/22/16   Altamese DillingVachhani, Vaibhavkumar, MD  oxybutynin (DITROPAN) 5 MG tablet Take 15 mg by mouth 2 (two) times daily.    [provider]  vitamin C (VITAMIN C) 500 MG tablet Take 1 tablet (500 mg total) by mouth 2 (two) times daily. 02/19/17   Enedina FinnerPatel, Sona, MD    Allergies Orange fruit [citrus]  Family History  Problem Relation Age of Onset  . Lymphoma Sister     Social History Social History   Tobacco Use  . Smoking status: Former Smoker    Packs/day: 0.50    Types: Cigarettes  . Smokeless tobacco: Never Used  Substance Use  Topics  . Alcohol use: No  . Drug use: Yes    Types: Marijuana    Review of Systems Constitutional: No fever/chills Eyes: No visual changes. ENT: No sore throat. No stiff neck no neck pain Cardiovascular: Denies chest pain. Respiratory: Denies shortness of breath. Gastrointestinal:   no vomiting.  No diarrhea.  No constipation. Genitourinary: Negative for dysuria. Musculoskeletal: Negative lower extremity swelling Skin: HPI Neurological: Negative for severe headaches, focal weakness or  numbness.   ____________________________________________   PHYSICAL EXAM:  VITAL SIGNS: ED Triage Vitals  Enc Vitals Group     BP 12/25/17 1606 (!) 111/92     Pulse Rate 12/25/17 1606 (!) 110     Resp 12/25/17 1606 20     Temp 12/25/17 1606 98.1 F (36.7 C)     Temp Source 12/25/17 1606 Oral     SpO2 12/25/17 1606 100 %     Weight 12/25/17 1607 100 lb (45.4 kg)     Height 12/25/17 1607 5\' 9"  (1.753 m)     Head Circumference --      Peak Flow --      Pain Score 12/25/17 1607 7     Pain Loc --      Pain Edu? --      Excl. in GC? --     Constitutional: Alert and oriented. Well appearing and in no acute distress. Eyes: Conjunctivae are normal Head: Atraumatic HEENT: No congestion/rhinnorhea. Mucous membranes are moist.  Oropharynx non-erythematous Neck:   Nontender with no meningismus, no masses, no stridor Cardiovascular: Normal rate, regular rhythm. Grossly normal heart sounds.  Good peripheral circulation. Respiratory: Normal respiratory effort.  No retractions. Lungs CTAB. Abdominal: Soft and nontender. No distention. No guarding no rebound Back:  There is no focal tenderness or step off.  there is no midline tenderness there are no lesions noted. there is no CVA tenderness Musculoskeletal: No lower extremity tenderness, no upper extremity tenderness. No joint effusions, no DVT signs strong distal pulses no edema, patient has atrophy of the muscles of his lower extremity from paraplegia, Neurologic:  Normal speech and language.  Lesion noted Skin:  Skin is warm, dry and intact.  But healthy appearing chronic decubitus ulcers noted to bilateral buttocks, with beefy red granulomatous tissue noted with no evidence of infection or cellulitic changes.  In addition, there is a very small chronic appearing ulcer to the right lateral aspect of the right distal foot.  Is not red or swollen there is no surrounding cellulitis there is no erythema there is no purulent discharge, again  beefy red noted tissue underneath which appears healthy, there is no crepitus, Psychiatric: Mood and affect are normal. Speech and behavior are normal.  ____________________________________________   LABS (all labs ordered are listed, but only abnormal results are displayed)  Labs Reviewed  CBC WITH DIFFERENTIAL/PLATELET - Abnormal; Notable for the following components:      Result Value   Hemoglobin 9.9 (*)    HCT 31.5 (*)    MCV 61.8 (*)    MCH 19.3 (*)    MCHC 31.3 (*)    RDW 21.3 (*)    Platelets 513 (*)    Neutro Abs 8.0 (*)    All other components within normal limits  BASIC METABOLIC PANEL - Abnormal; Notable for the following components:   Creatinine, Ser 0.48 (*)    All other components within normal limits  AEROBIC CULTURE (SUPERFICIAL SPECIMEN)  AEROBIC CULTURE (SUPERFICIAL SPECIMEN)  LACTIC ACID, PLASMA  Pertinent labs  results that were available during my care of the patient were reviewed by me and considered in my medical decision making (see chart for details). ____________________________________________  EKG  I personally interpreted any EKGs ordered by me or triage  ____________________________________________  RADIOLOGY  Pertinent labs & imaging results that were available during my care of the patient were reviewed by me and considered in my medical decision making (see chart for details). If possible, patient and/or family made aware of any abnormal findings.  Dg Foot Complete Right  Result Date: 12/25/2017 CLINICAL DATA:  Paraplegic. Wound on RIGHT foot and rash on face. Intermittent fevers. EXAM: RIGHT FOOT COMPLETE - 3+ VIEW COMPARISON:  Plain film of the RIGHT foot dated 04/28/2015. FINDINGS: Interval amputation at the fourth MTP joint. Earlier amputation at the fifth MTP joint. Remaining osseous structures are diffusely osteopenic, limiting characterization of osseous detail, however, there is no acute appearing osseous abnormality identified.  Characterization of the first distal phalanx is also limited by overlapping osseous structures. No fracture line or displaced fracture fragment. No destructive change or new focal demineralization to suggest osteomyelitis. No soft tissue gas seen. IMPRESSION: 1. No acute findings, with mild study limitations detailed above. 2. Osteopenia. 3. No soft tissue gas seen. Electronically Signed   By: Bary Cordell M.D.   On: 12/25/2017 19:08   ____________________________________________    PROCEDURES  Procedure(s) performed: None  Procedures  Critical Care performed: None  ____________________________________________   INITIAL IMPRESSION / ASSESSMENT AND PLAN / ED COURSE  Pertinent labs & imaging results that were available during my care of the patient were reviewed by me and considered in my medical decision making (see chart for details).  She was worried that he saw something brown on his chronic skin ulcer, does not appear to be infected we are sending culture.  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  Return precautions and follow-up given and clearly understood.    ____________________________________________   FINAL CLINICAL IMPRESSION(S) / ED DIAGNOSES  Final diagnoses:  None      This chart was dictated using voice recognition software.  Despite best efforts to proofread,  errors can occur which can change meaning.      Jeanmarie Plant, MD 12/25/17 2006

## 2017-12-25 NOTE — ED Triage Notes (Signed)
Pt to triage via wheelchair.  Pt is paraplegic.  Pt reports wound on right foot and rash on face.  Pt reports intermittent fevers.

## 2017-12-25 NOTE — Discharge Instructions (Signed)
There could be an early infection in your foot but at this time we do not see any significant markers for it.  We will give antibiotics as a precaution for the next few days.  Return to the emergency room if you have any new or worrisome symptoms including fever redness, increased pain, more swelling or other concerns.

## 2017-12-28 LAB — AEROBIC CULTURE W GRAM STAIN (SUPERFICIAL SPECIMEN)

## 2017-12-28 LAB — AEROBIC CULTURE  (SUPERFICIAL SPECIMEN): GRAM STAIN: NONE SEEN

## 2017-12-29 NOTE — Progress Notes (Signed)
ED Antimicrobial Stewardship Positive Culture Follow Up   Jonathon York is an 42 y.o. male who presented to [facility] on 12/25/2017 with a chief complaint of  Chief Complaint  Patient presents with  . Wound Infection    Recent Results (from the past 720 hour(s))  Aerobic Culture (superficial specimen)     Status: None   Collection Time: 12/25/17  7:00 PM  Result Value Ref Range Status   Specimen Description   Final    WOUND Performed at [facility], [address], [place], [place] [address]    Special Requests   Final    NONE Performed at [facility], [address] [place], [place] [address]    Gram Stain   Final    NO WBC SEEN NO ORGANISMS SEEN Performed at [facility], [address], [place], [place] [address]    Culture FEW METHICILLIN RESISTANT STAPHYLOCOCCUS AUREUS  Final   Report Status 12/28/2017 FINAL  Final   Organism ID, Bacteria METHICILLIN RESISTANT STAPHYLOCOCCUS AUREUS  Final      Susceptibility   Methicillin resistant staphylococcus aureus - MIC*    CIPROFLOXACIN >=8 RESISTANT Resistant     ERYTHROMYCIN >=8 RESISTANT Resistant     GENTAMICIN <=0.5 SENSITIVE Sensitive     OXACILLIN >=4 RESISTANT Resistant     TETRACYCLINE <=1 SENSITIVE Sensitive     VANCOMYCIN 1 SENSITIVE Sensitive     TRIMETH/SULFA 160 RESISTANT Resistant     CLINDAMYCIN <=0.25 SENSITIVE Sensitive     RIFAMPIN <=0.5 SENSITIVE Sensitive     Inducible Clindamycin NEGATIVE Sensitive     * FEW METHICILLIN RESISTANT STAPHYLOCOCCUS AUREUS    [x]  Treated with Bactrim, organism resistant to prescribed antimicrobial  New antibiotic prescription: Doxycycline 100 mg po bid x 7 days  ED Provider: Governor Rooks  12/29/17 1627: 1st attempt to contact patient:  Called phone number listed in the chart [phone] (voicemail not set up)and in Care Everywhere [phone] (unable to leave a message). Will f/u   Alhassan Everingham A 12/29/2017, 4:30 PM Clinical  Pharmacist

## 2018-03-18 ENCOUNTER — Other Ambulatory Visit: Payer: Self-pay

## 2018-03-18 ENCOUNTER — Emergency Department: Payer: Medicare Other

## 2018-03-18 ENCOUNTER — Inpatient Hospital Stay
Admission: EM | Admit: 2018-03-18 | Discharge: 2018-03-23 | DRG: 503 | Disposition: A | Discharge status: Home Health | Payer: Medicare Other | Attending: Family Medicine | Admitting: Family Medicine

## 2018-03-18 ENCOUNTER — Inpatient Hospital Stay: Payer: Medicare Other

## 2018-03-18 DIAGNOSIS — E440 Moderate protein-calorie malnutrition: Secondary | ICD-10-CM | POA: Diagnosis present

## 2018-03-18 DIAGNOSIS — L089 Local infection of the skin and subcutaneous tissue, unspecified: Secondary | ICD-10-CM | POA: Diagnosis present

## 2018-03-18 DIAGNOSIS — F1290 Cannabis use, unspecified, uncomplicated: Secondary | ICD-10-CM | POA: Diagnosis present

## 2018-03-18 DIAGNOSIS — B9562 Methicillin resistant Staphylococcus aureus infection as the cause of diseases classified elsewhere: Secondary | ICD-10-CM | POA: Diagnosis present

## 2018-03-18 DIAGNOSIS — M79671 Pain in right foot: Secondary | ICD-10-CM | POA: Diagnosis present

## 2018-03-18 DIAGNOSIS — Z960 Presence of urogenital implants: Secondary | ICD-10-CM | POA: Diagnosis not present

## 2018-03-18 DIAGNOSIS — Z7401 Bed confinement status: Secondary | ICD-10-CM | POA: Diagnosis not present

## 2018-03-18 DIAGNOSIS — K5900 Constipation, unspecified: Secondary | ICD-10-CM | POA: Diagnosis present

## 2018-03-18 DIAGNOSIS — D638 Anemia in other chronic diseases classified elsewhere: Secondary | ICD-10-CM | POA: Diagnosis present

## 2018-03-18 DIAGNOSIS — Z89421 Acquired absence of other right toe(s): Secondary | ICD-10-CM | POA: Diagnosis not present

## 2018-03-18 DIAGNOSIS — Z91018 Allergy to other foods: Secondary | ICD-10-CM

## 2018-03-18 DIAGNOSIS — R110 Nausea: Secondary | ICD-10-CM | POA: Diagnosis not present

## 2018-03-18 DIAGNOSIS — N319 Neuromuscular dysfunction of bladder, unspecified: Secondary | ICD-10-CM | POA: Diagnosis present

## 2018-03-18 DIAGNOSIS — D649 Anemia, unspecified: Secondary | ICD-10-CM | POA: Diagnosis not present

## 2018-03-18 DIAGNOSIS — G8220 Paraplegia, unspecified: Secondary | ICD-10-CM

## 2018-03-18 DIAGNOSIS — Z87891 Personal history of nicotine dependence: Secondary | ICD-10-CM

## 2018-03-18 DIAGNOSIS — E876 Hypokalemia: Secondary | ICD-10-CM | POA: Diagnosis present

## 2018-03-18 DIAGNOSIS — M86171 Other acute osteomyelitis, right ankle and foot: Secondary | ICD-10-CM | POA: Diagnosis present

## 2018-03-18 DIAGNOSIS — M8668 Other chronic osteomyelitis, other site: Secondary | ICD-10-CM | POA: Diagnosis present

## 2018-03-18 DIAGNOSIS — R1115 Cyclical vomiting syndrome unrelated to migraine: Secondary | ICD-10-CM

## 2018-03-18 DIAGNOSIS — M869 Osteomyelitis, unspecified: Secondary | ICD-10-CM | POA: Diagnosis not present

## 2018-03-18 DIAGNOSIS — Z872 Personal history of diseases of the skin and subcutaneous tissue: Secondary | ICD-10-CM | POA: Diagnosis not present

## 2018-03-18 DIAGNOSIS — L89154 Pressure ulcer of sacral region, stage 4: Secondary | ICD-10-CM | POA: Diagnosis present

## 2018-03-18 DIAGNOSIS — Z681 Body mass index (BMI) 19 or less, adult: Secondary | ICD-10-CM

## 2018-03-18 DIAGNOSIS — M86651 Other chronic osteomyelitis, right thigh: Secondary | ICD-10-CM

## 2018-03-18 DIAGNOSIS — L8990 Pressure ulcer of unspecified site, unspecified stage: Secondary | ICD-10-CM

## 2018-03-18 DIAGNOSIS — R109 Unspecified abdominal pain: Secondary | ICD-10-CM | POA: Diagnosis not present

## 2018-03-18 LAB — URINALYSIS, COMPLETE (UACMP) WITH MICROSCOPIC
BILIRUBIN URINE: NEGATIVE
GLUCOSE, UA: NEGATIVE mg/dL
Hgb urine dipstick: NEGATIVE
KETONES UR: 20 mg/dL — AB
NITRITE: POSITIVE — AB
PROTEIN: 100 mg/dL — AB
Specific Gravity, Urine: >1.046 — ABNORMAL HIGH (ref 1.005–1.030)
Squamous Epithelial / LPF: NONE SEEN (ref 0–5)
pH: 7 (ref 5.0–8.0)

## 2018-03-18 LAB — COMPREHENSIVE METABOLIC PANEL
ALBUMIN: 3.5 g/dL (ref 3.5–5.0)
ALK PHOS: 82 U/L (ref 38–126)
ALT: 13 U/L (ref 0–44)
AST: 17 U/L (ref 15–41)
Anion gap: 10 (ref 5–15)
BUN: 14 mg/dL (ref 6–20)
CO2: 27 mmol/L (ref 22–32)
Calcium: 9.1 mg/dL (ref 8.9–10.3)
Chloride: 106 mmol/L (ref 98–111)
Creatinine, Ser: 0.5 mg/dL — ABNORMAL LOW (ref 0.61–1.24)
GFR calc Af Amer: >60 mL/min (ref >60)
GFR calc non Af Amer: >60 mL/min (ref >60)
Glucose, Bld: 146 mg/dL — ABNORMAL HIGH (ref 70–99)
Potassium: 3.4 mmol/L — ABNORMAL LOW (ref 3.5–5.1)
SODIUM: 143 mmol/L (ref 135–145)
Total Bilirubin: 0.4 mg/dL (ref 0.3–1.2)
Total Protein: 8.3 g/dL — ABNORMAL HIGH (ref 6.5–8.1)

## 2018-03-18 LAB — CBC
HEMATOCRIT: 31.6 % — AB (ref 39.0–52.0)
HEMOGLOBIN: 8.7 g/dL — AB (ref 13.0–17.0)
MCH: 17.7 pg — ABNORMAL LOW (ref 26.0–34.0)
MCHC: 27.5 g/dL — AB (ref 30.0–36.0)
MCV: 64.4 fL — ABNORMAL LOW (ref 80.0–100.0)
Platelets: 522 10*3/uL — ABNORMAL HIGH (ref 150–400)
RBC: 4.91 MIL/uL (ref 4.22–5.81)
RDW: 20.5 % — ABNORMAL HIGH (ref 11.5–15.5)
WBC: 9.3 10*3/uL (ref 4.0–10.5)
nRBC: 0 % (ref 0.0–0.2)

## 2018-03-18 LAB — MRSA PCR SCREENING: MRSA BY PCR: NEGATIVE

## 2018-03-18 LAB — LIPASE, BLOOD: Lipase: 25 U/L (ref 11–51)

## 2018-03-18 MED ORDER — SODIUM CHLORIDE 0.9 % IV BOLUS
1000.0000 mL | Freq: Once | INTRAVENOUS | Status: AC
  Administered 2018-03-18: 1000 mL via INTRAVENOUS

## 2018-03-18 MED ORDER — KETOROLAC TROMETHAMINE 30 MG/ML IJ SOLN
30.0000 mg | Freq: Four times a day (QID) | INTRAMUSCULAR | Status: AC | PRN
  Administered 2018-03-18 – 2018-03-19 (×2): 30 mg via INTRAVENOUS
  Filled 2018-03-18 (×2): qty 1

## 2018-03-18 MED ORDER — HYDROCODONE-ACETAMINOPHEN 5-325 MG PO TABS
1.0000 | ORAL_TABLET | Freq: Four times a day (QID) | ORAL | Status: DC | PRN
  Administered 2018-03-18: 1 via ORAL
  Filled 2018-03-18 (×2): qty 1

## 2018-03-18 MED ORDER — VANCOMYCIN HCL IN DEXTROSE 1-5 GM/200ML-% IV SOLN
1000.0000 mg | Freq: Once | INTRAVENOUS | Status: AC
  Administered 2018-03-18: 1000 mg via INTRAVENOUS
  Filled 2018-03-18: qty 200

## 2018-03-18 MED ORDER — LACTULOSE 10 GM/15ML PO SOLN: 30.0000 g | Freq: Two times a day (BID) | ORAL | Status: DC | PRN

## 2018-03-18 MED ORDER — ONDANSETRON HCL 4 MG/2ML IJ SOLN
4.0000 mg | Freq: Once | INTRAMUSCULAR | Status: AC
  Administered 2018-03-18: 4 mg via INTRAVENOUS
  Filled 2018-03-18: qty 2

## 2018-03-18 MED ORDER — ENOXAPARIN SODIUM 40 MG/0.4ML ~~LOC~~ SOLN
40.0000 mg | SUBCUTANEOUS | Status: DC
  Administered 2018-03-18 – 2018-03-22 (×4): 40 mg via SUBCUTANEOUS
  Filled 2018-03-18 (×4): qty 0.4

## 2018-03-18 MED ORDER — METRONIDAZOLE IN NACL 5-0.79 MG/ML-% IV SOLN
500.0000 mg | Freq: Three times a day (TID) | INTRAVENOUS | Status: DC
  Administered 2018-03-18 – 2018-03-20 (×6): 500 mg via INTRAVENOUS
  Filled 2018-03-18 (×9): qty 100

## 2018-03-18 MED ORDER — ONDANSETRON HCL 4 MG/2ML IJ SOLN
4.0000 mg | Freq: Four times a day (QID) | INTRAMUSCULAR | Status: DC | PRN
  Administered 2018-03-18 – 2018-03-19 (×3): 4 mg via INTRAVENOUS
  Filled 2018-03-18 (×3): qty 2

## 2018-03-18 MED ORDER — VANCOMYCIN HCL IN DEXTROSE 750-5 MG/150ML-% IV SOLN
750.0000 mg | Freq: Two times a day (BID) | INTRAVENOUS | Status: DC
  Administered 2018-03-18 – 2018-03-19 (×2): 750 mg via INTRAVENOUS
  Filled 2018-03-18 (×5): qty 150

## 2018-03-18 MED ORDER — ACETAMINOPHEN 650 MG RE SUPP: 650.0000 mg | Freq: Four times a day (QID) | RECTAL | Status: DC | PRN

## 2018-03-18 MED ORDER — IOHEXOL 300 MG/ML  SOLN
100.0000 mL | Freq: Once | INTRAMUSCULAR | Status: AC | PRN
  Administered 2018-03-18: 100 mL via INTRAVENOUS

## 2018-03-18 MED ORDER — OXYBUTYNIN CHLORIDE 5 MG PO TABS
15.0000 mg | ORAL_TABLET | Freq: Two times a day (BID) | ORAL | Status: DC
  Filled 2018-03-18 (×6): qty 3

## 2018-03-18 MED ORDER — SODIUM CHLORIDE 0.9 % IV SOLN
2.0000 g | Freq: Two times a day (BID) | INTRAVENOUS | Status: DC
  Administered 2018-03-18 – 2018-03-23 (×10): 2 g via INTRAVENOUS
  Filled 2018-03-18 (×12): qty 2

## 2018-03-18 MED ORDER — KETOROLAC TROMETHAMINE 30 MG/ML IJ SOLN
15.0000 mg | INTRAMUSCULAR | Status: AC
  Administered 2018-03-18: 15 mg via INTRAVENOUS

## 2018-03-18 MED ORDER — ACETAMINOPHEN 325 MG PO TABS: 650.0000 mg | ORAL_TABLET | Freq: Four times a day (QID) | ORAL | Status: DC | PRN

## 2018-03-18 MED ORDER — KETOROLAC TROMETHAMINE 30 MG/ML IJ SOLN
INTRAMUSCULAR | Status: AC
  Filled 2018-03-18: qty 1

## 2018-03-18 MED ORDER — ONDANSETRON HCL 4 MG PO TABS
4.0000 mg | ORAL_TABLET | Freq: Four times a day (QID) | ORAL | Status: DC | PRN
  Filled 2018-03-18: qty 1

## 2018-03-18 MED ORDER — POLYETHYLENE GLYCOL 3350 17 G PO PACK: 17.0000 g | PACK | Freq: Every day | ORAL | Status: DC | PRN

## 2018-03-18 MED ORDER — SODIUM CHLORIDE 0.9 % IV SOLN
2.0000 g | Freq: Once | INTRAVENOUS | Status: AC
  Administered 2018-03-18: 2 g via INTRAVENOUS
  Filled 2018-03-18: qty 2

## 2018-03-18 MED ORDER — FAMOTIDINE IN NACL 20-0.9 MG/50ML-% IV SOLN
20.0000 mg | Freq: Once | INTRAVENOUS | Status: AC
  Administered 2018-03-18: 20 mg via INTRAVENOUS
  Filled 2018-03-18: qty 50

## 2018-03-18 NOTE — Consult Note (Signed)
[facility]                                                      Patient Demographics  Jonathon York, is a 42 y.o. male   MRN: [id number]   DOB - 03-02-1976  Admit Date - 03/18/2018    Outpatient Primary MD for the patient is [place], [facility]  Consult requested in the Hospital by [place] SirenSainani, Vivek J, MD, On 03/18/2018    Reason for consult osteomyelitis right foot   With History of -  Past Medical History:  Diagnosis Date  . Anxiety   . Decubitus ulcer   . Depression   . Neurogenic bladder   . Osteomyelitis (HCC)   . 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 AMPUTATION TOE Right 04/29/2015   Procedure: AMPUTATION TOE;  Surgeon: Linus Galasodd Cline, MD;  Location: [facility] ORS;  Service: Podiatry;  Laterality: Right;  . IRRIGATION AND DEBRIDEMENT BUTTOCKS    . TOE AMPUTATION Right     in for   Chief Complaint  Patient presents with  . Abdominal Pain     HPI  Jonathon York  is a 42 y.o. male, patient is a paraplegic secondary to work injury with no significant activity of his right lower extremity.  States it is completely numb also.  Does have any pain with the area.  He had fourth fifth toes removed about 4 years ago by Dr. Ether GriffinsFowler because of osteomyelitis and infection was doing well up until recently.  He came to the emergency room in August and had a little bit of drainage from the area and came back and recently with higher level of drainage and erosion down to bone to the region.    Review of Systems he is alert well oriented and pleasant  In addition to the HPI above,  No Fever-chills, No Headache, No changes with Vision or hearing, No problems swallowing food or Liquids, No Chest pain, Cough or Shortness of Breath, No Abdominal pain, No Nausea or Vommitting, Bowel  movements are regular, No Blood in stool or Urine, No dysuria, No new skin rashes or bruises, No new joints pains-aches,  No new weakness, tingling, numbness in any extremity, No recent weight gain or loss, No polyuria, polydypsia or polyphagia, As noted patient is paraplegic with no function to his lower extremities.  A full 10 point Review of Systems was done, except as stated above, all other Review of Systems were negative.   Social History Social History   Tobacco Use  . Smoking status: Former Smoker    Packs/day: 0.50    Types: Cigarettes  . Smokeless tobacco: Never Used  Substance Use Topics  . Alcohol use: No    Family History Family History  Problem Relation Age of Onset  . Lymphoma Sister     Prior to Admission medications   Medication Sig Start Date End Date Taking? Authorizing Provider  Amino Acids-Protein Hydrolys (FEEDING SUPPLEMENT, PRO-STAT SUGAR FREE 64,) LIQD Take 30 mLs by mouth 2 (two) times daily. Patient not taking: Reported on 03/18/2018 11/21/16   Altamese DillingVachhani, Vaibhavkumar, MD  feeding supplement, ENSURE ENLIVE, (ENSURE ENLIVE) LIQD Take 237 mLs by mouth 2 (two) times daily between meals. Patient not taking: Reported on 03/18/2018  11/21/16   Altamese Dilling, MD  HYDROcodone-acetaminophen (NORCO/VICODIN) 5-325 MG tablet Take 1-2 tablets by mouth every 6 (six) hours as needed for moderate pain. Patient not taking: Reported on 03/18/2018 02/19/17   Enedina Finner, MD  megestrol (MEGACE) 40 MG/ML suspension Take 20 mLs (800 mg total) by mouth daily. Patient not taking: Reported on 03/18/2018 02/19/17   Enedina Finner, MD  Multiple Vitamin (MULTIVITAMIN WITH MINERALS) TABS tablet Take 1 tablet by mouth daily. Patient not taking: Reported on 03/18/2018 11/22/16   Altamese Dilling, MD  nicotine (NICODERM CQ - DOSED IN MG/24 HR) 7 mg/24hr patch Place 1 patch (7 mg total) onto the skin daily. Patient not taking: Reported on 03/18/2018 11/22/16   Altamese Dilling, MD  oxybutynin (DITROPAN) 5 MG tablet Take 15 mg by mouth 2 (two) times daily.    [provider]  sulfamethoxazole-trimethoprim (BACTRIM DS,SEPTRA DS) 800-160 MG tablet Take 1 tablet by mouth 2 (two) times daily. Patient not taking: Reported on 03/18/2018 12/25/17   Jeanmarie Plant, MD  vitamin C (VITAMIN C) 500 MG tablet Take 1 tablet (500 mg total) by mouth 2 (two) times daily. Patient not taking: Reported on 03/18/2018 02/19/17   Enedina Finner, MD    Anti-infectives (From admission, onward)   Start     Dose/Rate Route Frequency Ordered Stop   03/18/18 2300  vancomycin (VANCOCIN) IVPB 750 mg/150 ml premix     750 mg 150 mL/hr over 60 Minutes Intravenous Every 12 hours 03/18/18 1018     03/18/18 2200  ceFEPIme (MAXIPIME) 2 g in sodium chloride 0.9 % 100 mL IVPB     2 g 200 mL/hr over 30 Minutes Intravenous Every 12 hours 03/18/18 1018     03/18/18 0930  ceFEPIme (MAXIPIME) 2 g in sodium chloride 0.9 % 100 mL IVPB     2 g 200 mL/hr over 30 Minutes Intravenous  Once 03/18/18 0926 03/18/18 1020   03/18/18 0930  metroNIDAZOLE (FLAGYL) IVPB 500 mg     500 mg 100 mL/hr over 60 Minutes Intravenous Every 8 hours 03/18/18 0926     03/18/18 0930  vancomycin (VANCOCIN) IVPB 1000 mg/200 mL premix     1,000 mg 200 mL/hr over 60 Minutes Intravenous  Once 03/18/18 0926 03/18/18 1241      Scheduled Meds: . enoxaparin (LOVENOX) injection  40 mg Subcutaneous Q24H  . ketorolac      . oxybutynin  15 mg Oral BID   Continuous Infusions: . ceFEPime (MAXIPIME) IV    . metronidazole 500 mg (03/18/18 1700)  . vancomycin     PRN Meds:.acetaminophen **OR** acetaminophen, ketorolac, lactulose, ondansetron **OR** ondansetron (ZOFRAN) IV, polyethylene glycol  Allergies  Allergen Reactions  . Orange Fruit [Citrus] Other (See Comments)    blisters    Physical Exam  Vitals  Blood pressure (!) 104/56, pulse 66, temperature 98.2 F (36.8 C), temperature source Oral, resp. rate  20, height 5\' 9"  (1.753 m), weight 52.2 kg, SpO2 97 %.  Lower Extremity exam:  Vascular: Palpable bilateral  Dermatological: Patient has a wound over the residual fifth metatarsal head.  The fourth fifth toes been amputated.  Fourth toe site looks stable but the fifth metatarsal head is exposed in the wound at this timeframe.  Bilateral redness or cellulitis around the region.  A culture was taken from that area in the ER.  Neurological: Significant peripheral numbness secondary to his quadriplegia  Ortho: Reeves amputation fourth fifth toes with a prominent fifth metatarsal head right foot  Data Review  CBC Recent Labs  Lab 03/18/18 0810  WBC 9.3  HGB 8.7*  HCT 31.6*  PLT 522*  MCV 64.4*  MCH 17.7*  MCHC 27.5*  RDW 20.5*   ------------------------------------------------------------------------------------------------------------------  Chemistries  Recent Labs  Lab 03/18/18 0810  NA 143  K 3.4*  CL 106  CO2 27  GLUCOSE 146*  BUN 14  CREATININE 0.50*  CALCIUM 9.1  AST 17  ALT 13  ALKPHOS 82  BILITOT 0.4   -------------------------------------------------------------------------------Imaging results:   Ct Abdomen Pelvis W Contrast  Result Date: 03/18/2018 CLINICAL DATA:  Vomiting, abdominal pain. EXAM: CT ABDOMEN AND PELVIS WITH CONTRAST TECHNIQUE: Multidetector CT imaging of the abdomen and pelvis was performed using the standard protocol following bolus administration of intravenous contrast. CONTRAST:  OMNIPAQUE IOHEXOL 300 MG/ML  SOLN COMPARISON:  02/15/2017 FINDINGS: Lower chest: Lung bases are clear. No effusions. Heart is normal size. Hepatobiliary: No focal hepatic abnormality. Gallbladder unremarkable. Tiny cyst centrally in the right hepatic lobe. Pancreas: No focal abnormality or ductal dilatation. Spleen: No focal abnormality.  Normal size. Adrenals/Urinary Tract: Small left renal cysts. No hydronephrosis bilaterally. Adrenal glands unremarkable.  Urinary bladder decompressed with Foley catheter in place. Stomach/Bowel: Moderate stool burden throughout the colon. Appendix is normal. Stomach and small bowel grossly unremarkable. No evidence of bowel obstruction. Vascular/Lymphatic: Aortic atherosclerosis. No enlarged abdominal or pelvic lymph nodes. Reproductive: No visible focal abnormality. Other: No free fluid or free air. Musculoskeletal: Extensive decubitus ulcers noted over the sacrum and ischium bilaterally, most pronounced over the right ischium. Sclerosis noted within the right ischium and sacrum which may reflect chronic osteomyelitis. IMPRESSION: Moderate stool burden throughout the colon. Chronic decubitus ulcers over the ischium bilaterally and sacrum with sclerosis throughout the right ischium and sacrum, likely chronic osteomyelitis. Chronic indwelling Foley catheter noted with bladder decompressed. Scattered aortic atherosclerosis. No acute findings in the abdomen or pelvis. Electronically Signed   By: Charlett Nose M.D.   On: 03/18/2018 09:47   Dg Chest Portable 1 View  Result Date: 03/18/2018 CLINICAL DATA:  Pain and vomiting.  Renal failure EXAM: PORTABLE CHEST 1 VIEW COMPARISON:  February 14, 2017 FINDINGS: No edema or consolidation. Heart is upper normal in size with pulmonary vascularity normal. No adenopathy. Postoperative changes noted in the thoracic and upper lumbar regions. IMPRESSION: No edema or consolidation.  Heart upper normal in size. Electronically Signed   By: Bretta Bang III M.D.   On: 03/18/2018 08:48   Dg Foot Complete Right  Result Date: 03/18/2018 CLINICAL DATA:  Wound fifth metatarsal region EXAM: RIGHT FOOT COMPLETE - 3+ VIEW COMPARISON:  December 25, 2017 FINDINGS: Frontal, oblique, and lateral views were obtained. Patient has had previous amputation at the fourth and fifth MTP joint levels. There is soft tissue ulceration along the lateral distal foot region. There is loss cortex along a portion of the  lateral aspect of the distal most aspect of the fifth metatarsal, concerning for osteomyelitis. No other bony destruction evident. There is diffuse osteoporosis. There are flexion deformities of the first IP as well as the second and third PIP and DIP joints. There is moderate narrowing of the first MTP joint. No erosive changes. There is moderate soft tissue swelling. IMPRESSION: 1. Soft tissue ulceration along the lateral distal aspect of the foot with underlying loss of cortex along the lateral aspect of the distal most aspect of the fifth metatarsal, felt to represent localized osteomyelitis. No other bony destruction evident. 2. Status post previous amputations at  the fourth and fifth MTP joint levels. 3.  Diffuse osteoporosis. 4. Narrowing first MTP joint. Flexion of the first IP as well as the second and third PIP and DIP joints. 5.  Moderate soft tissue swelling. Electronically Signed   By: Bretta Bang III M.D.   On: 03/18/2018 08:47    Assessment & Plan: X-rays from today versus 3 months ago showed destructive and demineralization changes to the fifth metatarsal head consistent with osteomyelitis in the region.  I am awaiting MRI to be able to fully evaluate the extent of involvement of that metatarsal.  Plan the patient that we will need to surgically remove that bone but we may be able to leave the fourth without any real problems.  The schedule I believe for the MRI tomorrow and at that time I will be able to evaluate it and will try to set some surgery at form on Friday to remove that involved fifth metatarsal bone right foot.  Has a history of MRSA this likely was causing the problem at this timeframe.  Will get bone cultures at the time of surgery as well as proximal margins for evaluation of clean margins.  Active Problems:   Acute osteomyelitis of right foot [facility])   Family Communication: Plan discussed with patient  Recardo Evangelist M.D on 03/18/2018 at 5:21 PM  Thank you for the  consult, we will follow the patient with you in the Hospital.

## 2018-03-18 NOTE — ED Provider Notes (Signed)
[facility] Emergency Department Provider Note  ____________________________________________  Time seen: Approximately 9:40 AM  I have reviewed the triage vital signs and the nursing notes.   HISTORY  Chief Complaint Abdominal Pain    HPI Jonathon York is a 42 y.o. 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  Multiple episodes of vomiting.  After persistent vomiting he started to notice a small amount of coffee grounds in the emesis.  No black or bloody stool.  Normal bowel movement yesterday.  Chronic Foley catheter use, he changed to himself 1 week ago.  Abdominal pain is constant, nonradiating, no aggravating or alleviating factors.  Moderate intensity, aching.  He also complains of increased swelling of the right foot for the past several days.  He has a history of fourth and fifth toe amputation.   Patient has not seen a doctor in close to a year, no ongoing professional wound care.   Past Medical History:  Diagnosis Date  . Anxiety   . Decubitus ulcer   . Depression   . Neurogenic bladder   . Osteomyelitis (HCC)   . Paraparesis of both lower limbs (HCC) 02/21/00     Patient Active Problem List   Diagnosis Date Noted  . 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 Protein-calorie malnutrition, severe 11/20/2016  . Herpes simplex infection of penis   . Decubitus ulcer 11/19/2016  . 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 Decubitus ulcer of sacral region   . Sepsis (HCC) 04/15/2016  . 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 Pressure ulcer stage III 04/30/2015  . Sterile pyuria 04/30/2015  . Paraplegia (HCC) 04/30/2015  . Neurogenic bladder 04/30/2015  . 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 Malnutrition of moderate degree  04/28/2015     Past Surgical History:  Procedure Laterality Date  . 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 IRRIGATION AND DEBRIDEMENT BUTTOCKS    . TOE AMPUTATION Right   Thoracic spinal fusion   Prior to Admission medications   Medication Sig Start Date End Date Taking? Authorizing Provider  Amino Acids-Protein Hydrolys (FEEDING SUPPLEMENT, PRO-STAT SUGAR FREE 64,) LIQD Take 30 mLs by mouth 2 (two) times daily. 11/21/16   Altamese Dilling, MD  feeding supplement, ENSURE ENLIVE, (ENSURE ENLIVE) LIQD Take 237 mLs by mouth 2 (two) times daily between meals. 11/21/16   Altamese Dilling, MD  HYDROcodone-acetaminophen (NORCO/VICODIN) 5-325 MG tablet Take 1-2 tablets by mouth every 6 (six) hours as needed for moderate pain. 02/19/17   Enedina Finner, MD  megestrol (MEGACE) 40 MG/ML suspension Take 20 mLs (800 mg total) by mouth daily. 02/19/17   Enedina Finner, MD  Multiple Vitamin (MULTIVITAMIN WITH MINERALS) TABS tablet Take 1 tablet by mouth daily. 11/22/16   Altamese Dilling, MD  nicotine (NICODERM CQ - DOSED IN MG/24 HR) 7 mg/24hr patch Place 1 patch (7 mg total) onto the skin daily. 11/22/16   Altamese Dilling, MD  oxybutynin (DITROPAN) 5 MG tablet Take 15 mg by mouth 2 (two) times daily.    [provider]  sulfamethoxazole-trimethoprim (BACTRIM DS,SEPTRA DS) 800-160 MG tablet Take 1 tablet by mouth 2 (two) times daily. 12/25/17   Jeanmarie Plant, MD  vitamin  C (VITAMIN C) 500 MG tablet Take 1 tablet (500 mg total) by mouth 2 (two) times daily. 02/19/17   Enedina FinnerPatel, Sona, MD     Allergies Orange fruit [citrus]   Family History  Problem Relation Age of Onset  . Lymphoma Sister     Social History Social History   Tobacco Use  . Smoking status: Former Smoker    Packs/day: 0.50    Types: Cigarettes  . Smokeless tobacco: Never Used  Substance Use  Topics  . Alcohol use: No  . Drug use: Yes    Types: Marijuana    Comment: 2 days ago    Review of Systems  Constitutional:   No fever or chills.  ENT:   No sore throat. No rhinorrhea. Cardiovascular:   No chest pain or syncope. Respiratory:   No dyspnea or cough. Gastrointestinal:   Positive as above for abdominal pain and vomiting..  Musculoskeletal:   Positive right foot swelling.  Positive for chronic bilateral lower leg paraplegia. All other systems reviewed and are negative except as documented above in ROS and HPI.  ____________________________________________   PHYSICAL EXAM:  VITAL SIGNS: ED Triage Vitals  Enc Vitals Group     BP 03/18/18 0746 108/75     Pulse Rate 03/18/18 0746 92     Resp 03/18/18 0746 16     Temp 03/18/18 0746 98.6 F (37 C)     Temp Source 03/18/18 0746 Oral     SpO2 03/18/18 0746 100 %     Weight 03/18/18 0734 115 lb (52.2 kg)     Height 03/18/18 0734 5\' 9"  (1.753 m)     Head Circumference --      Peak Flow --      Pain Score 03/18/18 0734 8     Pain Loc --      Pain Edu? --      Excl. in GC? --     Vital signs reviewed, nursing assessments reviewed.   Constitutional:   Alert and oriented. Non-toxic appearance. Eyes:   Conjunctivae are normal. EOMI. PERRL. ENT      Head:   Normocephalic and atraumatic.      Nose:   No congestion/rhinnorhea.       Mouth/Throat:   Dry mucous membranes, no pharyngeal erythema. No peritonsillar mass.       Neck:   No meningismus. Full ROM. Hematological/Lymphatic/Immunilogical:   No cervical lymphadenopathy. Cardiovascular:   RRR. Symmetric bilateral radial and DP pulses.  No murmurs. Cap refill less than 2 seconds. Respiratory:   Normal respiratory effort without tachypnea/retractions. Breath sounds are clear and equal bilaterally. No wheezes/rales/rhonchi. Gastrointestinal:   Soft with generalized tenderness . Non distended. There is no CVA tenderness.  No rebound, rigidity, or guarding.  Rectal exam  reveals brown stool, Hemoccult negative. Genitourinary:   Normal Musculoskeletal:   Bilateral lower leg paralysis and atrophy.  Total of 3 stage IV decubitus ulcers posteriorly over the lumbar spine and bilateral sacroiliac soft tissue.  Right sacral ulcer shows exposed periosteum in the base of the ulcer and probes to the ischium.  There is some tunneling and undermining of the wound, but no purulent drainage or inflammatory features.  No necrotic tissue. Right foot is warm and well-perfused, status post amputation of fourth and fifth toes.  There is a 1 cm soft tissue ulceration at the distal end of the fifth metatarsal with exposure of the metatarsal.  Wound probes to bone.  No purulent drainage but there is a  foul odor.  Soft tissues are inflamed and swollen.  No crepitus. Neurologic:   Normal speech and language.  Motor at baseline with chronic paraplegia. No acute focal neurologic deficits are appreciated.  Skin:    Skin is warm, dry with multiple ulcerations as above. No rash noted.  No petechiae, purpura, or bullae.  ____________________________________________    LABS (pertinent positives/negatives) (all labs ordered are listed, but only abnormal results are displayed) Labs Reviewed  COMPREHENSIVE METABOLIC PANEL - Abnormal; Notable for the following components:      Result Value   Potassium 3.4 (*)    Glucose, Bld 146 (*)    Creatinine, Ser 0.50 (*)    Total Protein 8.3 (*)    All other components within normal limits  CBC - Abnormal; Notable for the following components:   Hemoglobin 8.7 (*)    HCT 31.6 (*)    MCV 64.4 (*)    MCH 17.7 (*)    MCHC 27.5 (*)    RDW 20.5 (*)    Platelets 522 (*)    All other components within normal limits  URINE CULTURE  CULTURE, BLOOD (SINGLE)  LIPASE, BLOOD  URINALYSIS, COMPLETE (UACMP) WITH MICROSCOPIC   ____________________________________________   EKG    ____________________________________________    RADIOLOGY  Ct  Abdomen Pelvis W Contrast  Result Date: 03/18/2018 CLINICAL DATA:  Vomiting, abdominal pain. EXAM: CT ABDOMEN AND PELVIS WITH CONTRAST TECHNIQUE: Multidetector CT imaging of the abdomen and pelvis was performed using the standard protocol following bolus administration of intravenous contrast. CONTRAST:  OMNIPAQUE IOHEXOL 300 MG/ML  SOLN COMPARISON:  02/15/2017 FINDINGS: Lower chest: Lung bases are clear. No effusions. Heart is normal size. Hepatobiliary: No focal hepatic abnormality. Gallbladder unremarkable. Tiny cyst centrally in the right hepatic lobe. Pancreas: No focal abnormality or ductal dilatation. Spleen: No focal abnormality.  Normal size. Adrenals/Urinary Tract: Small left renal cysts. No hydronephrosis bilaterally. Adrenal glands unremarkable. Urinary bladder decompressed with Foley catheter in place. Stomach/Bowel: Moderate stool burden throughout the colon. Appendix is normal. Stomach and small bowel grossly unremarkable. No evidence of bowel obstruction. Vascular/Lymphatic: Aortic atherosclerosis. No enlarged abdominal or pelvic lymph nodes. Reproductive: No visible focal abnormality. Other: No free fluid or free air. Musculoskeletal: Extensive decubitus ulcers noted over the sacrum and ischium bilaterally, most pronounced over the right ischium. Sclerosis noted within the right ischium and sacrum which may reflect chronic osteomyelitis. IMPRESSION: Moderate stool burden throughout the colon. Chronic decubitus ulcers over the ischium bilaterally and sacrum with sclerosis throughout the right ischium and sacrum, likely chronic osteomyelitis. Chronic indwelling Foley catheter noted with bladder decompressed. Scattered aortic atherosclerosis. No acute findings in the abdomen or pelvis. Electronically Signed   By: Charlett Nose M.D.   On: 03/18/2018 09:47   Dg Chest Portable 1 View  Result Date: 03/18/2018 CLINICAL DATA:  Pain and vomiting.  Renal failure EXAM: PORTABLE CHEST 1 VIEW  COMPARISON:  February 14, 2017 FINDINGS: No edema or consolidation. Heart is upper normal in size with pulmonary vascularity normal. No adenopathy. Postoperative changes noted in the thoracic and upper lumbar regions. IMPRESSION: No edema or consolidation.  Heart upper normal in size. Electronically Signed   By: Bretta Bang III M.D.   On: 03/18/2018 08:48   Dg Foot Complete Right  Result Date: 03/18/2018 CLINICAL DATA:  Wound fifth metatarsal region EXAM: RIGHT FOOT COMPLETE - 3+ VIEW COMPARISON:  December 25, 2017 FINDINGS: Frontal, oblique, and lateral views were obtained. Patient has had previous amputation at the fourth  and fifth MTP joint levels. There is soft tissue ulceration along the lateral distal foot region. There is loss cortex along a portion of the lateral aspect of the distal most aspect of the fifth metatarsal, concerning for osteomyelitis. No other bony destruction evident. There is diffuse osteoporosis. There are flexion deformities of the first IP as well as the second and third PIP and DIP joints. There is moderate narrowing of the first MTP joint. No erosive changes. There is moderate soft tissue swelling. IMPRESSION: 1. Soft tissue ulceration along the lateral distal aspect of the foot with underlying loss of cortex along the lateral aspect of the distal most aspect of the fifth metatarsal, felt to represent localized osteomyelitis. No other bony destruction evident. 2. Status post previous amputations at the fourth and fifth MTP joint levels. 3.  Diffuse osteoporosis. 4. Narrowing first MTP joint. Flexion of the first IP as well as the second and third PIP and DIP joints. 5.  Moderate soft tissue swelling. Electronically Signed   By: Bretta Bang III M.D.   On: 03/18/2018 08:47    ____________________________________________   PROCEDURES Procedures  ____________________________________________  DIFFERENTIAL DIAGNOSIS   Osteomyelitis of right foot, diverticulitis,  pancreatitis, appendicitis, intra-abdominal abscess  CLINICAL IMPRESSION / ASSESSMENT AND PLAN / ED COURSE  Pertinent labs & imaging results that were available during my care of the patient were reviewed by me and considered in my medical decision making (see chart for details).    Patient presents with abdominal pain and foot swelling.  Vital signs are normal and patient is nontoxic, but with his medical history and findings today, I suspect that he has osteomyelitis of the right foot.  Also worried about intra-abdominal pathology and will obtain labs and a CT scan.  Plan to give antibiotics and admit to the hospital for further management.  Clinical Course as of Mar 18 1012  Wed Mar 18, 2018  1000 CT abdomen pelvis shows no acute findings but does describe a chronic decubitus ulcerations and evidence of chronic osteomyelitis of the right ischium, consistent with exam..   [PS]    Clinical Course User Index [PS] Sharman Cheek, MD     ____________________________________________   FINAL CLINICAL IMPRESSION(S) / ED DIAGNOSES    Final diagnoses:  Other acute osteomyelitis of right foot (HCC)  Pressure injury of sacral region, stage 4 (HCC)  Chronic osteomyelitis involving pelvic region and thigh affecting right side (HCC)  Paraplegia [facility])     ED Discharge Orders    None      Portions of this note were generated with dragon dictation software. Dictation errors may occur despite best attempts at proofreading.    Sharman Cheek, MD 03/18/18 1013

## 2018-03-18 NOTE — Consult Note (Addendum)
Pharmacy Antibiotic Note  Jonathon LeedsRichard L Helderman is a 42 y.o. male admitted on 03/18/2018 with sepsis. Patient is paraplegic, has a chronic foley catheter, and a right foot wound. 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 Pharmacy has been consulted for Vancomycin, Cefepime, Flagyl dosing.  Plan: Patient received Cefepime 2 g IV x 1 and Vancomycin 1 g x 1 in the ED.   Continue Cefepime 2 g IV q12h and will order Vancomycin 750 mg Q12h 8 hours after ED dose. Vancomycin trough goal 15-20. Will order trough for 11/21 at 2030.  Spoke with physician about continuing Cefepime/Vancomycin/Flagyl instead of Zosyn/Vancomycin due to nephrotoxicity especially in the setting of paraplegia.  Vancomycin Kinetics Using adjusted CrCl for paraplegia 71 mL/min and actual BW of 52.2 kg ke 0.063 Vd 36.5 T1/2 11 hrs   Height: 5\' 9"  (175.3 cm) Weight: 115 lb (52.2 kg) IBW/kg (Calculated) : 70.7  Temp (24hrs), Avg:98.6 F (37 C), Min:98.6 F (37 C), Max:98.6 F (37 C)  Recent Labs  Lab 03/18/18 0810  WBC 9.3  CREATININE 0.50*    Estimated Creatinine Clearance: 88.8 mL/min (A) (by C-G formula based on SCr of 0.5 mg/dL (L)).    Allergies  Allergen Reactions  . Orange Fruit [Citrus] Other (See Comments)    blisters    Antimicrobials this admission: 11/20 Cefepime >>  11/20 Vancomycin >>  11/20 Flagyl >>  Dose adjustments this admission: N/A  Microbiology results: 11/20 BCx: sent 11/20 UCx: sent    Thank you for allowing pharmacy to be a part of this patient's care.   Mauri ReadingSavanna M Kirin Brandenburger, PharmD Pharmacy Resident  03/18/2018 10:16 AM

## 2018-03-18 NOTE — ED Triage Notes (Signed)
Pt arrived via ems for report of vomiting and abd pain x1 day - he reports hemoptysis - pt is paraplegic - has a foley cath (last changed 1 week ago) - has infection that is documented in right foot

## 2018-03-18 NOTE — Consult Note (Signed)
WOC Nurse wound consult note [facility]  No family present. Reason for Consult: Wounds to sacrum, bilateral ischium, right foot Wound type: Stage 4 PI to sacral, right and left ischial areas.  Full thickness, infectious wound to right foot (Osteomyelitis) POA: Yes Measurements:  Sacrum 7.3 cm x 5.3 cm x 0.6 cm; 100% clean pink, no odor, no induration, no drainage, no dressing on wound.  Patient states he places dry gauze into/on all three posterior wounds (sacrum, bilateral ischium) and changes it every day. Right ischium 7.3 cm x 7 cm x 2.3 cm with 2 cm of undermining along inferior margin. Wound bed 100% clean, pink, no odor, no induration.  Patient states this wound drains heavily. Left ischium 5 cm x 2.8 cm x 1.2 cm. 100% clean, pink with a central area that is cream colored, but is not hard like bone (uncertain what this one area represent--it does not appear as slough). No undermining, no odor, no induration. Right lateral foot wound measures 1.4 cm x 1.4 cm x 0.7 cm.  The wound bed is pink, no odor, no induration. The patient states this wound drains heavily.  Tissue surrounding all wounds is normal color and texture. Dressing procedure/placement/frequency:  Place Xeroform gauze [place] # [id number]) into the wound bed of the sacrum and left ischium.  Cover with ABD pads. Tape in place. Change daily. Place AqualCel Ag+ [place] [phone]) into the wound beds of the right ischium and right foot.  Cover the right ischium with ABD pad. Cover the right foot wound with dry gauze.  Tape in place. Change daily. Additional plan of care measures include a low air loss mattress and linen instructions. Monitor the wound area(s) for worsening of condition such as: Signs/symptoms of infection,  Increase in size,  Development of or worsening of odor, Development of pain, or increased pain at the affected locations.  Notify the medical team if any of these develop.  Thank you for the consult.   Discussed plan of care with the patient and bedside nurse.  WOC nurse will not follow at this time.  Please re-consult the WOC team if needed.  Helmut MusterSherry Breyden Jeudy, RN, MSN, CWOCN, CNS-BC, pager [phone]

## 2018-03-18 NOTE — H&P (Signed)
[employer] - [place] at [facility]    PATIENT NAME: Jonathon York    MR#:  [id number]  DATE OF BIRTH:  10/16/1975  DATE OF ADMISSION:  03/18/2018  PRIMARY CARE PHYSICIAN: Dan Humphreys, [facility]   REQUESTING/REFERRING PHYSICIAN: Dr. Alfonse Flavors  CHIEF COMPLAINT:   Chief Complaint  Patient presents with  . Abdominal Pain    HISTORY OF PRESENT ILLNESS:  Jonathon York  is a 42 y.o. 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  Patient says he was in his usual state of health and developed nausea vomiting yesterday associated with some abdominal pain and also was complaining of some right foot pain and had a ulcer there.  He came to the ER underwent CT scan of the abdomen pelvis which was suggestive just constipation but the x-ray of his right foot was suggestive of possible early osteomyelitis.  Patient is currently afebrile and hemodynamically stable with a normal white cell count.  Given his x-ray findings on the right foot hospitalist services were contacted for admission.  Patient denies any fever but admits to some chills, no chest pain, shortness of breath or any other associated symptoms presently.  PAST MEDICAL HISTORY:   Past Medical History:  Diagnosis Date  . Anxiety   . Decubitus ulcer   . Depression   . Neurogenic bladder   . Osteomyelitis (HCC)   . Paraparesis of both lower limbs (HCC) 02/21/00    PAST SURGICAL HISTORY:   Past Surgical History:  Procedure Laterality Date  . 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 IRRIGATION AND DEBRIDEMENT BUTTOCKS    . TOE AMPUTATION Right      SOCIAL HISTORY:   Social History   Tobacco Use  . Smoking status: Former Smoker    Packs/day: 0.50    Types: Cigarettes  . Smokeless tobacco: Never Used  Substance Use Topics  . Alcohol use: No    FAMILY HISTORY:   Family History  Problem Relation Age of Onset  . Lymphoma Sister     DRUG ALLERGIES:   Allergies  Allergen Reactions  . Orange Fruit [Citrus] Other (See Comments)    blisters    REVIEW OF SYSTEMS:   Review of Systems  Constitutional: Negative for fever and weight loss.  HENT: Negative for congestion, nosebleeds and tinnitus.   Eyes: Negative for blurred vision, double vision and redness.  Respiratory: Negative for cough, hemoptysis and shortness of breath.   Cardiovascular: Negative for chest pain, orthopnea, leg swelling and PND.  Gastrointestinal: Positive for abdominal pain, nausea and vomiting. Negative for diarrhea and melena.  Genitourinary: Negative for dysuria, hematuria and urgency.  Musculoskeletal: Negative for falls and joint pain.  Neurological: Negative for dizziness, tingling, sensory change, focal weakness, seizures, weakness and headaches.  Endo/Heme/Allergies: Negative for polydipsia. Does not bruise/bleed easily.  Psychiatric/Behavioral: Negative for depression and memory loss. The patient is not nervous/anxious.     MEDICATIONS AT HOME:   Prior to Admission medications   Medication Sig Start Date End Date Taking? Authorizing Provider  Amino Acids-Protein Hydrolys (FEEDING SUPPLEMENT, PRO-STAT SUGAR FREE 64,) LIQD Take 30 mLs by mouth 2 (two) times daily. Patient not taking:  Reported on 03/18/2018 11/21/16   Altamese DillingVachhani, Vaibhavkumar, MD  feeding supplement, ENSURE ENLIVE, (ENSURE ENLIVE) LIQD Take 237 mLs by mouth 2 (two) times daily between meals. Patient not taking: Reported on 03/18/2018 11/21/16   Altamese DillingVachhani, Vaibhavkumar, MD  HYDROcodone-acetaminophen (NORCO/VICODIN) 5-325 MG tablet Take 1-2 tablets by mouth every 6 (six) hours as  needed for moderate pain. Patient not taking: Reported on 03/18/2018 02/19/17   Enedina FinnerPatel, Sona, MD  megestrol (MEGACE) 40 MG/ML suspension Take 20 mLs (800 mg total) by mouth daily. Patient not taking: Reported on 03/18/2018 02/19/17   Enedina FinnerPatel, Sona, MD  Multiple Vitamin (MULTIVITAMIN WITH MINERALS) TABS tablet Take 1 tablet by mouth daily. Patient not taking: Reported on 03/18/2018 11/22/16   Altamese DillingVachhani, Vaibhavkumar, MD  nicotine (NICODERM CQ - DOSED IN MG/24 HR) 7 mg/24hr patch Place 1 patch (7 mg total) onto the skin daily. Patient not taking: Reported on 03/18/2018 11/22/16   Altamese DillingVachhani, Vaibhavkumar, MD  oxybutynin (DITROPAN) 5 MG tablet Take 15 mg by mouth 2 (two) times daily.    [provider]  sulfamethoxazole-trimethoprim (BACTRIM DS,SEPTRA DS) 800-160 MG tablet Take 1 tablet by mouth 2 (two) times daily. Patient not taking: Reported on 03/18/2018 12/25/17   Jeanmarie PlantMcShane, James A, MD  vitamin C (VITAMIN C) 500 MG tablet Take 1 tablet (500 mg total) by mouth 2 (two) times daily. Patient not taking: Reported on 03/18/2018 02/19/17   Enedina FinnerPatel, Sona, MD      VITAL SIGNS:  Blood pressure 108/75, pulse 92, temperature 98.6 F (37 C), temperature source Oral, resp. rate 16, height 5\' 9"  (1.753 m), weight 52.2 kg, SpO2 100 %.  PHYSICAL EXAMINATION:  Physical Exam  GENERAL:  42 y.o.-year-old patient lying in the bed in no acute distress.  EYES: Pupils equal, round, reactive to light and accommodation. No scleral icterus. Extraocular muscles intact.  HEENT: Head atraumatic, normocephalic. Oropharynx and nasopharynx clear. No oropharyngeal erythema, moist oral mucosa  NECK:  Supple, no jugular venous distention. No thyroid enlargement, no tenderness.  LUNGS: Normal breath sounds bilaterally, no wheezing, rales, rhonchi. No use of accessory muscles of respiration.  CARDIOVASCULAR: S1, S2 RRR. No murmurs, rubs, gallops, clicks.  ABDOMEN: Soft, nontender, nondistended. Bowel sounds present. No  organomegaly or mass.  EXTREMITIES: No pedal edema, cyanosis, or clubbing. + 2 pedal & radial pulses b/l.    NEUROLOGIC: Cranial nerves II through XII are intact. No focal Motor or sensory deficits appreciated b/l PSYCHIATRIC: The patient is alert and oriented x 3. Good affect.  SKIN: No obvious rash, lesion, Stage III-IV Sacral decubitus Ulcer.  Right foot stage II pressure ulcer on the lateral part of the right foot with no acute drainage noted.  Chronic indwelling Foley catheter in place with yellow urine draining.  LABORATORY PANEL:   CBC Recent Labs  Lab 03/18/18 0810  WBC 9.3  HGB 8.7*  HCT 31.6*  PLT 522*   ------------------------------------------------------------------------------------------------------------------  Chemistries  Recent Labs  Lab 03/18/18 0810  NA 143  K 3.4*  CL 106  CO2 27  GLUCOSE 146*  BUN 14  CREATININE 0.50*  CALCIUM 9.1  AST 17  ALT 13  ALKPHOS 82  BILITOT 0.4   ------------------------------------------------------------------------------------------------------------------  Cardiac Enzymes No results for input(s): TROPONINI in the last 168 hours. ------------------------------------------------------------------------------------------------------------------  RADIOLOGY:  Ct Abdomen Pelvis W Contrast  Result Date: 03/18/2018 CLINICAL DATA:  Vomiting, abdominal pain. EXAM: CT ABDOMEN AND PELVIS WITH CONTRAST TECHNIQUE: Multidetector CT imaging of the abdomen and pelvis was performed using the standard protocol following bolus  administration of intravenous contrast. CONTRAST:  OMNIPAQUE IOHEXOL 300 MG/ML  SOLN COMPARISON:  02/15/2017 FINDINGS: Lower chest: Lung bases are clear. No effusions. Heart is normal size. Hepatobiliary: No focal hepatic abnormality. Gallbladder unremarkable. Tiny cyst centrally in the right hepatic lobe. Pancreas: No focal abnormality or ductal dilatation. Spleen: No focal abnormality.  Normal size.  Adrenals/Urinary Tract: Small left renal cysts. No hydronephrosis bilaterally. Adrenal glands unremarkable. Urinary bladder decompressed with Foley catheter in place. Stomach/Bowel: Moderate stool burden throughout the colon. Appendix is normal. Stomach and small bowel grossly unremarkable. No evidence of bowel obstruction. Vascular/Lymphatic: Aortic atherosclerosis. No enlarged abdominal or pelvic lymph nodes. Reproductive: No visible focal abnormality. Other: No free fluid or free air. Musculoskeletal: Extensive decubitus ulcers noted over the sacrum and ischium bilaterally, most pronounced over the right ischium. Sclerosis noted within the right ischium and sacrum which may reflect chronic osteomyelitis. IMPRESSION: Moderate stool burden throughout the colon. Chronic decubitus ulcers over the ischium bilaterally and sacrum with sclerosis throughout the right ischium and sacrum, likely chronic osteomyelitis. Chronic indwelling Foley catheter noted with bladder decompressed. Scattered aortic atherosclerosis. No acute findings in the abdomen or pelvis. Electronically Signed   By: Charlett Nose M.D.   On: 03/18/2018 09:47   Dg Chest Portable 1 View  Result Date: 03/18/2018 CLINICAL DATA:  Pain and vomiting.  Renal failure EXAM: PORTABLE CHEST 1 VIEW COMPARISON:  February 14, 2017 FINDINGS: No edema or consolidation. Heart is upper normal in size with pulmonary vascularity normal. No adenopathy. Postoperative changes noted in the thoracic and upper lumbar regions. IMPRESSION: No edema or consolidation.  Heart upper normal in size. Electronically Signed   By: Bretta Bang III M.D.   On: 03/18/2018 08:48   Dg Foot Complete Right  Result Date: 03/18/2018 CLINICAL DATA:  Wound fifth metatarsal region EXAM: RIGHT FOOT COMPLETE - 3+ VIEW COMPARISON:  December 25, 2017 FINDINGS: Frontal, oblique, and lateral views were obtained. Patient has had previous amputation at the fourth and fifth MTP joint levels. There is  soft tissue ulceration along the lateral distal foot region. There is loss cortex along a portion of the lateral aspect of the distal most aspect of the fifth metatarsal, concerning for osteomyelitis. No other bony destruction evident. There is diffuse osteoporosis. There are flexion deformities of the first IP as well as the second and third PIP and DIP joints. There is moderate narrowing of the first MTP joint. No erosive changes. There is moderate soft tissue swelling. IMPRESSION: 1. Soft tissue ulceration along the lateral distal aspect of the foot with underlying loss of cortex along the lateral aspect of the distal most aspect of the fifth metatarsal, felt to represent localized osteomyelitis. No other bony destruction evident. 2. Status post previous amputations at the fourth and fifth MTP joint levels. 3.  Diffuse osteoporosis. 4. Narrowing first MTP joint. Flexion of the first IP as well as the second and third PIP and DIP joints. 5.  Moderate soft tissue swelling. Electronically Signed   By: Bretta Bang III M.D.   On: 03/18/2018 08:47     IMPRESSION AND PLAN:   42 year old male with past medical history of spinal injury from a fall resulting in paraplegia, neurogenic bladder status post chronic Foley, urinary incontinence, previous history of osteomyelitis, chronic stage III-IV sacral decubitus ulcer with chronic osteomyelitis who presents to the hospital due to abdominal pain nausea vomiting and right foot pain.  1.  Nausea vomiting/abdominal pain-etiology unclear but suspected to be secondary to constipation. -  Patient is afebrile and hemodynamically stable with a normal white cell count.  Patient CT abdomen pelvis is suggestive just constipation and no other acute pathology. - I will place the patient on some stool softeners and follow response.  2.  Right foot osteomyelitis-patient presents with right foot pain and is status post previous fourth and fifth MTP amputations.  Patient on  x-ray is noted to have soft tissue ulceration of the lateral distal aspect of the foot suggestive of possible localized osteomyelitis. - We will get MRI of the right foot, get a podiatry consult.  Empirically treat patient with IV vancomycin, Zosyn.  Patient is afebrile, with a normal white cell count and hemodynamically stable.  3.  Hypokalemia-will replace potassium orally.  Repeat level in the morning.  4.  Anemia-this is anemia of chronic disease.  Hemoglobin stable no acute need for transfusion.  5.  Urinary incontinence-continue oxybutynin.    All the records are reviewed and case discussed with ED provider. Management plans discussed with the patient, family and they are in agreement.  CODE STATUS: Full code  TOTAL TIME TAKING CARE OF THIS PATIENT: 45 minutes.    Houston Siren M.D on 03/18/2018 at 11:00 AM  Between 7am to 6pm - Pager - [phone]  After 6pm go to [email or web address] - password [email or web address] [facility]  [facility]  Office  [phone]  CC: Primary care physician; [facility]

## 2018-03-18 NOTE — ED Notes (Signed)
Called lab they said they already added urine culture on to the UA.

## 2018-03-19 ENCOUNTER — Inpatient Hospital Stay: Payer: Medicare Other | Admitting: Anesthesiology

## 2018-03-19 ENCOUNTER — Encounter: Payer: Self-pay | Admitting: Anesthesiology

## 2018-03-19 ENCOUNTER — Encounter
Admission: EM | Disposition: A | Discharge status: Home Health | Payer: Self-pay | Source: Home / Self Care | Attending: Family Medicine

## 2018-03-19 HISTORY — PX: AMPUTATION: SHX166

## 2018-03-19 LAB — VANCOMYCIN, TROUGH: VANCOMYCIN TR: 14 ug/mL — AB (ref 15–20)

## 2018-03-19 LAB — BASIC METABOLIC PANEL
ANION GAP: 8 (ref 5–15)
BUN: 16 mg/dL (ref 6–20)
CO2: 23 mmol/L (ref 22–32)
Calcium: 8 mg/dL — ABNORMAL LOW (ref 8.9–10.3)
Chloride: 111 mmol/L (ref 98–111)
Creatinine, Ser: 0.38 mg/dL — ABNORMAL LOW (ref 0.61–1.24)
GLUCOSE: 87 mg/dL (ref 70–99)
Potassium: 3.3 mmol/L — ABNORMAL LOW (ref 3.5–5.1)
Sodium: 142 mmol/L (ref 135–145)

## 2018-03-19 LAB — CBC
HCT: 25.9 % — ABNORMAL LOW (ref 39.0–52.0)
HEMOGLOBIN: 7.2 g/dL — AB (ref 13.0–17.0)
MCH: 18 pg — AB (ref 26.0–34.0)
MCHC: 27.8 g/dL — ABNORMAL LOW (ref 30.0–36.0)
MCV: 64.8 fL — ABNORMAL LOW (ref 80.0–100.0)
NRBC: 0 % (ref 0.0–0.2)
Platelets: 367 10*3/uL (ref 150–400)
RBC: 4 MIL/uL — AB (ref 4.22–5.81)
RDW: 20.4 % — ABNORMAL HIGH (ref 11.5–15.5)
WBC: 5.3 10*3/uL (ref 4.0–10.5)

## 2018-03-19 SURGERY — AMPUTATION, FOOT, RAY
Anesthesia: Monitor Anesthesia Care | Site: Foot | Laterality: Right

## 2018-03-19 MED ORDER — PROPOFOL 500 MG/50ML IV EMUL
INTRAVENOUS | Status: DC | PRN
  Administered 2018-03-19: 100 ug/kg/min via INTRAVENOUS

## 2018-03-19 MED ORDER — LIDOCAINE HCL 1 % IJ SOLN
INTRAMUSCULAR | Status: DC | PRN
  Administered 2018-03-19: 5 mL

## 2018-03-19 MED ORDER — SODIUM CHLORIDE 0.9 % IV SOLN
INTRAVENOUS | Status: DC | PRN
  Administered 2018-03-19: 14:00:00 via INTRAVENOUS

## 2018-03-19 MED ORDER — PROPOFOL 500 MG/50ML IV EMUL
INTRAVENOUS | Status: AC
  Filled 2018-03-19: qty 50

## 2018-03-19 MED ORDER — BUPIVACAINE HCL (PF) 0.5 % IJ SOLN
INTRAMUSCULAR | Status: AC
  Filled 2018-03-19: qty 30

## 2018-03-19 MED ORDER — BUPIVACAINE HCL 0.5 % IJ SOLN
INTRAMUSCULAR | Status: DC | PRN
  Administered 2018-03-19: 5 mL

## 2018-03-19 MED ORDER — MIDAZOLAM HCL 2 MG/2ML IJ SOLN
INTRAMUSCULAR | Status: DC | PRN
  Administered 2018-03-19: 2 mg via INTRAVENOUS

## 2018-03-19 MED ORDER — LIDOCAINE HCL (CARDIAC) PF 100 MG/5ML IV SOSY
PREFILLED_SYRINGE | INTRAVENOUS | Status: DC | PRN
  Administered 2018-03-19: 40 mg via INTRATRACHEAL

## 2018-03-19 MED ORDER — LIDOCAINE HCL (PF) 2 % IJ SOLN
INTRAMUSCULAR | Status: AC
  Filled 2018-03-19: qty 10

## 2018-03-19 MED ORDER — VANCOMYCIN HCL 1000 MG IV SOLR
INTRAVENOUS | Status: DC | PRN
  Administered 2018-03-19: 1000 mg

## 2018-03-19 MED ORDER — ONDANSETRON HCL 4 MG/2ML IJ SOLN: 4.0000 mg | Freq: Once | INTRAMUSCULAR | Status: DC | PRN

## 2018-03-19 MED ORDER — PROMETHAZINE HCL 25 MG/ML IJ SOLN
12.5000 mg | Freq: Four times a day (QID) | INTRAMUSCULAR | Status: DC | PRN
  Administered 2018-03-19 – 2018-03-21 (×4): 12.5 mg via INTRAVENOUS
  Filled 2018-03-19 (×5): qty 1

## 2018-03-19 MED ORDER — LIDOCAINE HCL (PF) 1 % IJ SOLN
INTRAMUSCULAR | Status: AC
  Filled 2018-03-19: qty 30

## 2018-03-19 MED ORDER — VANCOMYCIN HCL 1000 MG IV SOLR
INTRAVENOUS | Status: AC
  Filled 2018-03-19: qty 1000

## 2018-03-19 MED ORDER — FENTANYL CITRATE (PF) 100 MCG/2ML IJ SOLN
INTRAMUSCULAR | Status: DC | PRN
  Administered 2018-03-19 (×2): 25 ug via INTRAVENOUS
  Administered 2018-03-19: 50 ug via INTRAVENOUS

## 2018-03-19 MED ORDER — PROPOFOL 10 MG/ML IV BOLUS
INTRAVENOUS | Status: AC
  Filled 2018-03-19: qty 20

## 2018-03-19 MED ORDER — MIDAZOLAM HCL 2 MG/2ML IJ SOLN
INTRAMUSCULAR | Status: AC
  Filled 2018-03-19: qty 2

## 2018-03-19 MED ORDER — PROPOFOL 10 MG/ML IV BOLUS
INTRAVENOUS | Status: DC | PRN
  Administered 2018-03-19: 50 mg via INTRAVENOUS

## 2018-03-19 MED ORDER — FENTANYL CITRATE (PF) 100 MCG/2ML IJ SOLN: 25.0000 ug | INTRAMUSCULAR | Status: DC | PRN

## 2018-03-19 MED ORDER — VANCOMYCIN HCL IN DEXTROSE 750-5 MG/150ML-% IV SOLN
750.0000 mg | Freq: Three times a day (TID) | INTRAVENOUS | Status: DC
  Administered 2018-03-20 (×2): 750 mg via INTRAVENOUS
  Filled 2018-03-19 (×4): qty 150

## 2018-03-19 MED ORDER — FENTANYL CITRATE (PF) 100 MCG/2ML IJ SOLN
INTRAMUSCULAR | Status: AC
  Filled 2018-03-19: qty 2

## 2018-03-19 SURGICAL SUPPLY — 43 items
"PENCIL ELECTRO HAND CTR " (MISCELLANEOUS) ×1 IMPLANT
BANDAGE ACE 4X5 VEL STRL LF (GAUZE/BANDAGES/DRESSINGS) ×1 IMPLANT
BANDAGE ELASTIC 4 LF NS (GAUZE/BANDAGES/DRESSINGS) ×2 IMPLANT
BLADE MED AGGRESSIVE (BLADE) ×2 IMPLANT
BLADE SURG 15 STRL LF DISP TIS (BLADE) ×4 IMPLANT
BLADE SURG 15 STRL SS (BLADE) ×4
BNDG CONFORM 3 STRL LF (GAUZE/BANDAGES/DRESSINGS) ×2 IMPLANT
BNDG ESMARK 4X12 TAN STRL LF (GAUZE/BANDAGES/DRESSINGS) ×2 IMPLANT
BNDG GAUZE 4.5X4.1 6PLY STRL (MISCELLANEOUS) ×2 IMPLANT
CANISTER SUCT 1200ML W/VALVE (MISCELLANEOUS) ×2 IMPLANT
COVER WAND RF STERILE (DRAPES) ×2 IMPLANT
CUFF TOURN 18 STER (MISCELLANEOUS) ×3 IMPLANT
CUFF TOURN DUAL PL 12 NO SLV (MISCELLANEOUS) ×2 IMPLANT
DRAPE FLUOR MINI C-ARM 54X84 (DRAPES) ×2 IMPLANT
DURAPREP 26ML APPLICATOR (WOUND CARE) ×2 IMPLANT
ELECT REM PT RETURN 9FT ADLT (ELECTROSURGICAL) ×2
ELECTRODE REM PT RTRN 9FT ADLT (ELECTROSURGICAL) ×1 IMPLANT
GAUZE PETRO XEROFOAM 1X8 (MISCELLANEOUS) ×2 IMPLANT
GAUZE SPONGE 4X4 12PLY STRL (GAUZE/BANDAGES/DRESSINGS) ×2 IMPLANT
GAUZE XEROFORM 4X4 STRL (GAUZE/BANDAGES/DRESSINGS) ×1 IMPLANT
GLOVE BIO SURGEON STRL SZ8 (GLOVE) ×2 IMPLANT
GLOVE INDICATOR 8.0 STRL GRN (GLOVE) ×2 IMPLANT
GOWN STRL REUS W/ TWL LRG LVL3 (GOWN DISPOSABLE) ×2 IMPLANT
GOWN STRL REUS W/TWL LRG LVL3 (GOWN DISPOSABLE) ×2
KIT STIMULAN RAPID CURE 5CC (Orthopedic Implant) ×1 IMPLANT
KIT TURNOVER KIT A (KITS) ×2 IMPLANT
LABEL OR SOLS (LABEL) ×2 IMPLANT
NDL HYPO 25X1 1.5 SAFETY (NEEDLE) ×3 IMPLANT
NDL SAFETY ECLIPSE 18X1.5 (NEEDLE) ×1 IMPLANT
NEEDLE HYPO 18GX1.5 SHARP (NEEDLE) ×1
NEEDLE HYPO 25X1 1.5 SAFETY (NEEDLE) ×6 IMPLANT
NS IRRIG 500ML POUR BTL (IV SOLUTION) ×2 IMPLANT
PACK EXTREMITY ARMC (MISCELLANEOUS) ×2 IMPLANT
PENCIL ELECTRO HAND CTR (MISCELLANEOUS) ×2 IMPLANT
RASP SM TEAR CROSS CUT (RASP) ×2 IMPLANT
STOCKINETTE STRL 6IN 960660 (GAUZE/BANDAGES/DRESSINGS) ×2 IMPLANT
SUT ETH BLK MONO 3 0 FS 1 12/B (SUTURE) ×2 IMPLANT
SUT ETHILON 3-0 FS-10 30 BLK (SUTURE) ×2
SUT ETHILON 5 0 PS 2 18 (SUTURE) ×2 IMPLANT
SUT VIC AB 4-0 FS2 27 (SUTURE) ×2 IMPLANT
SUTURE EHLN 3-0 FS-10 30 BLK (SUTURE) IMPLANT
SWAB CULTURE AMIES ANAERIB BLU (MISCELLANEOUS) ×2 IMPLANT
SYR 10ML LL (SYRINGE) ×2 IMPLANT

## 2018-03-19 NOTE — Op Note (Signed)
Operative note   Surgeon: Dr. Recardo EvangelistMatthew Khayman Kirsch, DPM.    Assistant: None    Preop diagnosis: Osteomyelitis fifth metatarsal right foot    Postop diagnosis: Same    Procedure:   1.  Excision of distal 3/4th of fifth metatarsal right foot           EBL: 15 cc    Anesthesia:IV sedation by the anesthesia team I delivered 10 cc of 0.5% Marcaine plain at the base of the fifth metatarsal preoperatively    Hemostasis: Ankle tourniquet 220 mils mercury pressure for 10 minutes    Specimen: 1.  Bone culture from the fifth metatarsal head 2.  Fifth metatarsal head with proximal portion marked as well as a secondary section of the proximal bone that was marked with suture.  The proximal portion of the fifth metatarsal that was resected initially was marked with marking pen    Complications: None    Operative indications: Osteomyelitis fifth metatarsal    Procedure:  Patient was brought into the OR and placed on the operating table in thesupine position. After anesthesia was obtained theright lower extremity was prepped and draped in usual sterile fashion.  Operative Report: This time tissue directed to the right fifth metatarsal.  To look semielliptical incisions were made starting at the proximal one quarter of the metatarsal and extended out and incorporated the ulcerative skin damage at the end of the metatarsal head.  Tarsal head was exposed in the wound.  This ellipse of skin was then removed and the metatarsal head was freed from soft tissue attachments.  Prior to removal of this I took a culture of the obviously damaged fifth metatarsal head for aerobic and anaerobic culture and sensitivity.  At this point the proximal portion of the metatarsal was checked and the point where the bone was seen to be firmer was evaluated and the power saw was used to resect the bone from that area.  At the bone cut the bone was removed and soft tissue attachments and removed.  I marked the proximal portion with  a marking pen checked the residual bone and elected to remove a little more proximally another 8 mm of bone.  I took this and labeled it with a nylon suture and sent that down as well.  Hopefully will evaluate both these areas for clean margins.  After this tourniquet was released bleeders clamped bovied as required.  Copious irrigation was achieved and vancomycin stimuli and beads were then placed up against the bone resection region and into the wound.  4-0 Vicryl was then used to close the deeper tissues with simple interrupted sutures.  Skin was then closed with 3-0 nylon simple interrupted sutures. A sterile dressing was placed across the area consisting of Xeroform gauze for force conformer Kerlix and Ace wrap.    Patient tolerated the procedure and anesthesia well.  Was transported from the OR to the PACU with all vital signs stable and vascular status intact. To be discharged per routine protocol.  Will follow up in approximately 1 week in the outpatient clinic.

## 2018-03-19 NOTE — Progress Notes (Signed)
I agree with this assessment as preceptor for M. Tumey RN

## 2018-03-19 NOTE — Anesthesia Preprocedure Evaluation (Signed)
Anesthesia Evaluation  Patient identified by MRN, date of birth, ID band Patient awake    Reviewed: Allergy & Precautions, NPO status , Patient's Chart, lab work & pertinent test results, reviewed documented beta blocker date and time   Airway Mallampati: II  TM Distance: >3 FB     Dental  (+) Chipped   Pulmonary former smoker,           Cardiovascular      Neuro/Psych PSYCHIATRIC DISORDERS Anxiety Depression    GI/Hepatic   Endo/Other    Renal/GU      Musculoskeletal   Abdominal   Peds  Hematology   Anesthesia Other Findings Paraperesis. Hb 7.2. CXR and EKG ok.  Reproductive/Obstetrics                             Anesthesia Physical Anesthesia Plan  ASA: III  Anesthesia Plan: MAC   Post-op Pain Management:    Induction:   PONV Risk Score and Plan:   Airway Management Planned:   Additional Equipment:   Intra-op Plan:   Post-operative Plan:   Informed Consent: I have reviewed the patients History and Physical, chart, labs and discussed the procedure including the risks, benefits and alternatives for the proposed anesthesia with the patient or authorized representative who has indicated his/her understanding and acceptance.     Plan Discussed with: CRNA  Anesthesia Plan Comments:         Anesthesia Quick Evaluation

## 2018-03-19 NOTE — Anesthesia Post-op Follow-up Note (Signed)
Anesthesia QCDR form completed.        

## 2018-03-19 NOTE — Progress Notes (Signed)
Pharmacy Antibiotic Note  Jonathon LeedsRichard L Fasnacht is a 42 y.o. male admitted on 03/18/2018 with sepsis. Patient is paraplegic, has a chronic foley catheter, and a right foot wound. 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 Pharmacy has been consulted for Vancomycin, Cefepime, Flagyl dosing.  Plan: Patient received Cefepime 2 g IV x 1 and Vancomycin 1 g x 1 in the ED.   Continue Cefepime 2 g IV q12h and will order Vancomycin 750 mg Q12h 8 hours after ED dose. Vancomycin trough goal 15-20. Will order trough for 11/21 at 2030.  Spoke with physician about continuing Cefepime/Vancomycin/Flagyl instead of Zosyn/Vancomycin due to nephrotoxicity especially in the setting of paraplegia.  Vancomycin Kinetics Using adjusted CrCl for paraplegia 71 mL/min and actual BW of 52.2 kg ke 0.063 Vd 36.5 T1/2 11 hrs  11/21:  VT @ 20:15 = 14 mcg/mL Will increase dose to Vancomycin 750 mg IV Q8H to start on 11/21 @ 22:00. Will recheck VT before 3 rd new dose on 11/22 @ 13:30.   Height: 5\' 9"  (175.3 cm) Weight: 115 lb (52.2 kg) IBW/kg (Calculated) : 70.7  Temp (24hrs), Avg:97.6 F (36.4 C), Min:97.2 F (36.2 C), Max:98 F (36.7 C)  Recent Labs  Lab 03/18/18 0810 03/19/18 0720 03/19/18 2015  WBC 9.3 5.3  --   CREATININE 0.50* 0.38*  --   VANCOTROUGH  --   --  14*    Estimated Creatinine Clearance: 88.8 mL/min (A) (by C-G formula based on SCr of 0.38 mg/dL (L)).    Allergies  Allergen Reactions  . Orange Fruit [Citrus] Other (See Comments)    blisters    Antimicrobials this admission:   >>    >>   Dose adjustments this admission:   Microbiology results:  BCx:   UCx:    Sputum:    MRSA PCR:   Thank you for allowing pharmacy to be a part of this patient's care.  Keedan Sample D 03/19/2018 9:01 PM

## 2018-03-19 NOTE — H&P (Signed)
H and P has been reviewed and no changes are noted.  

## 2018-03-19 NOTE — Care Management Note (Addendum)
Case Management Note  Patient Details  Name: Jonathon LeedsRichard L York MRN: [id number] Date of Birth: 25-Nov-1975  Subjective/Objective:     Admitted to [facility] with the diagnosis of acute osteomyelitis of right foot. Lives alone. Sister is Elease Hashimotoatricia [phone]). [facility] "a while [facility].." [facility] [place]. Home Health per [facility] in the past "and many more." [facility] Nursing facility at [facility] "and many more."  No home oxygen. [facility] 10/2016 and 03/2016.  Wheelchair in the home. Takes care of all basic activities of daily living himself. "My ride will transport."  Possible surgery on right toe tomorrow   [name] is a paraplegic from a work related accident. Chronic foley in place           Action/Plan: Will continue to follow for discharge plans   Expected Discharge Date:                  Expected Discharge Plan:     In-House Referral:   yes  Discharge planning Services   yes  Post Acute Care Choice:    Choice offered to:     DME Arranged:    DME Agency:     HH Arranged:    HH Agency:     Status of Service:     If discussed at [employer] Meetings, dates discussed:    Additional Comments:  Gwenette GreetBrenda S Carmita Boom, RN MSN CCM Care Management [phone] 03/19/2018, 9:39 AM

## 2018-03-19 NOTE — Progress Notes (Signed)
[employer] - [place] [facility]                                                                                                                                                                                  Patient Demographics   Jonathon York, is a 42 y.o. male, DOB - 10-07-1975, ZOX:[id number]  Admit date - 03/18/2018   Admitting Physician Houston Siren, MD  Outpatient Primary MD for the patient is [place], [facility]   LOS - 1  Subjective: His nausea is improved    Review of Systems:   CONSTITUTIONAL: No documented fever. No fatigue, weakness. No weight gain, no weight loss.  EYES: No blurry or double vision.  ENT: No tinnitus. No postnasal drip. No redness of the oropharynx.  RESPIRATORY: No cough, no wheeze, no hemoptysis. No dyspnea.  CARDIOVASCULAR: No chest pain. No orthopnea. No palpitations. No syncope.  GASTROINTESTINAL: No nausea, no vomiting or diarrhea. No abdominal pain. No melena or hematochezia.  GENITOURINARY: No dysuria or hematuria.  ENDOCRINE: No polyuria or nocturia. No heat or cold intolerance.  HEMATOLOGY: No anemia. No bruising. No bleeding.  INTEGUMENTARY: No rashes. No lesions.  MUSCULOSKELETAL: Right foot osteomyelitis.  NEUROLOGIC: No numbness, tingling, or ataxia. No seizure-type activity.  PSYCHIATRIC: No anxiety. No insomnia. No ADD.    Vitals:   Vitals:   03/18/18 1217 03/18/18 1918 03/19/18 0453 03/19/18 0500  BP:  104/64 (!) 79/48 (!) 96/55  Pulse:  71 65 66  Resp:      Temp:  98.7 F (37.1 C) 98 F (36.7 C)   TempSrc:  Oral Oral   SpO2: 97% 97% 99% 99%  Weight:      Height:        Wt Readings from Last 3 Encounters:  03/18/18 52.2 kg  12/25/17 45.4 kg  02/14/17 43.1 kg     Intake/Output Summary (Last 24 hours) at 03/19/2018 1251 Last data filed at 03/19/2018 0500 Gross per 24 hour  Intake 344.15 ml  Output 250 ml  Net 94.15 ml    Physical Exam:   GENERAL: Pleasant-appearing in no  apparent distress.  HEAD, EYES, EARS, NOSE AND THROAT: Atraumatic, normocephalic. Extraocular muscles are intact. Pupils equal and reactive to light. Sclerae anicteric. No conjunctival injection. No oro-pharyngeal erythema.  NECK: Supple. There is no jugular venous distention. No bruits, no lymphadenopathy, no thyromegaly.  HEART: Regular rate and rhythm,. No murmurs, no rubs, no clicks.  LUNGS: Clear to auscultation bilaterally. No rales or rhonchi. No wheezes.  ABDOMEN: Soft, flat, nontender, nondistended. Has good bowel sounds. No hepatosplenomegaly appreciated.  EXTREMITIES: No evidence of any cyanosis, clubbing, or peripheral edema.  +2  pedal and radial pulses bilaterally.  Right foot dressing in place NEUROLOGIC: The patient is alert, awake, and oriented x3 with no focal motor or sensory deficits appreciated bilaterally.  SKIN: Moist and warm with no rashes appreciated.  Psych: Not anxious, depressed LN: No inguinal LN enlargement    Antibiotics   Anti-infectives (From admission, onward)   Start     Dose/Rate Route Frequency Ordered Stop   03/18/18 2300  vancomycin (VANCOCIN) IVPB 750 mg/150 ml premix     750 mg 150 mL/hr over 60 Minutes Intravenous Every 12 hours 03/18/18 1018     03/18/18 2200  ceFEPIme (MAXIPIME) 2 g in sodium chloride 0.9 % 100 mL IVPB     2 g 200 mL/hr over 30 Minutes Intravenous Every 12 hours 03/18/18 1018     03/18/18 0930  ceFEPIme (MAXIPIME) 2 g in sodium chloride 0.9 % 100 mL IVPB     2 g 200 mL/hr over 30 Minutes Intravenous  Once 03/18/18 0926 03/18/18 1020   03/18/18 0930  metroNIDAZOLE (FLAGYL) IVPB 500 mg     500 mg 100 mL/hr over 60 Minutes Intravenous Every 8 hours 03/18/18 0926     03/18/18 0930  vancomycin (VANCOCIN) IVPB 1000 mg/200 mL premix     1,000 mg 200 mL/hr over 60 Minutes Intravenous  Once 03/18/18 0926 03/18/18 1241      Medications   Scheduled Meds: . enoxaparin (LOVENOX) injection  40 mg Subcutaneous Q24H  . oxybutynin  15  mg Oral BID   Continuous Infusions: . ceFEPime (MAXIPIME) IV Stopped (03/18/18 2124)  . metronidazole 500 mg (03/19/18 1224)  . vancomycin Stopped (03/19/18 0001)   PRN Meds:.acetaminophen **OR** acetaminophen, HYDROcodone-acetaminophen, ketorolac, lactulose, ondansetron **OR** ondansetron (ZOFRAN) IV, polyethylene glycol   Data Review:   Micro Results Recent Results (from the past 240 hour(s))  Urine culture     Status: Abnormal (Preliminary result)   Collection Time: 03/18/18  8:10 AM  Result Value Ref Range Status   Specimen Description   Final    URINE, RANDOM Performed at [facility], [address] [place], [place] [address]    Special Requests   Final    NONE Performed at [facility], [address] [place], [place] [address]    Culture >=100,000 COLONIES/mL GRAM NEGATIVE RODS (A)  Final   Report Status PENDING  Incomplete  Blood culture (single)     Status: None (Preliminary result)   Collection Time: 03/18/18  8:10 AM  Result Value Ref Range Status   Specimen Description BLOOD LEFT WRIST  Final   Special Requests   Final    BOTTLES DRAWN AEROBIC AND ANAEROBIC Blood Culture results may not be optimal due to an excessive volume of blood received in culture bottles   Culture   Final    NO GROWTH < 24 HOURS Performed at [facility], [address] [place], [place] [address]    Report Status PENDING  Incomplete  MRSA PCR Screening     Status: None   Collection Time: 03/18/18  2:38 PM  Result Value Ref Range Status   MRSA by PCR NEGATIVE NEGATIVE Final    Comment:        The GeneXpert MRSA Assay (FDA approved for NASAL specimens only), is one component of a comprehensive MRSA colonization surveillance program. It is not intended to diagnose MRSA infection nor to guide or monitor treatment for MRSA infections. Performed at [facility], [address] [place], [place] [address]  Radiology Reports Ct Abdomen  Pelvis W Contrast  Result Date: 03/18/2018 CLINICAL DATA:  Vomiting, abdominal pain. EXAM: CT ABDOMEN AND PELVIS WITH CONTRAST TECHNIQUE: Multidetector CT imaging of the abdomen and pelvis was performed using the standard protocol following bolus administration of intravenous contrast. CONTRAST:  OMNIPAQUE IOHEXOL 300 MG/ML  SOLN COMPARISON:  02/15/2017 FINDINGS: Lower chest: Lung bases are clear. No effusions. Heart is normal size. Hepatobiliary: No focal hepatic abnormality. Gallbladder unremarkable. Tiny cyst centrally in the right hepatic lobe. Pancreas: No focal abnormality or ductal dilatation. Spleen: No focal abnormality.  Normal size. Adrenals/Urinary Tract: Small left renal cysts. No hydronephrosis bilaterally. Adrenal glands unremarkable. Urinary bladder decompressed with Foley catheter in place. Stomach/Bowel: Moderate stool burden throughout the colon. Appendix is normal. Stomach and small bowel grossly unremarkable. No evidence of bowel obstruction. Vascular/Lymphatic: Aortic atherosclerosis. No enlarged abdominal or pelvic lymph nodes. Reproductive: No visible focal abnormality. Other: No free fluid or free air. Musculoskeletal: Extensive decubitus ulcers noted over the sacrum and ischium bilaterally, most pronounced over the right ischium. Sclerosis noted within the right ischium and sacrum which may reflect chronic osteomyelitis. IMPRESSION: Moderate stool burden throughout the colon. Chronic decubitus ulcers over the ischium bilaterally and sacrum with sclerosis throughout the right ischium and sacrum, likely chronic osteomyelitis. Chronic indwelling Foley catheter noted with bladder decompressed. Scattered aortic atherosclerosis. No acute findings in the abdomen or pelvis. Electronically Signed   By: Charlett Nose M.D.   On: 03/18/2018 09:47   Mr Foot Right Wo Contrast  Result Date: 03/18/2018 CLINICAL DATA:  42 year old male diabetic with foot swelling. Suspect osteomyelitis. EXAM:  MRI OF THE RIGHT FOREFOOT WITHOUT CONTRAST TECHNIQUE: Multiplanar, multisequence MR imaging of the right foot was performed. No intravenous contrast was administered. COMPARISON:  Radiographs of the right foot from 12/25/2017 and 03/18/2018 FINDINGS: Bones/Joint/Cartilage Cortical bone destruction along the lateral aspect of the fifth metatarsal head with periosteal new bone formation of the neck and underlying marrow edema involving the distal third of the fifth metatarsal are in keeping with acute osteomyelitis. Soft tissue ulceration and attenuation is suggested adjacent to fifth metatarsal. First through fourth metatarsals are of normal signal intensity morphology. The first through third toes are intact. 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 Ligaments Noncontributory Muscles and Tendons No evidence of pyomyositis. Mild neurogenic changes of the plantar forefoot musculature. Soft tissues Soft tissue attenuation and ulceration adjacent to the fifth metatarsal head and shaft. IMPRESSION: Acute osteomyelitis with bone destruction and periosteal new bone formation involving the fifth metatarsal head and neck. Marrow signal abnormality involves the distal third of the fifth metatarsal with adjacent overlying soft tissue ulceration and edema. No drainable fluid collection or evidence of pyomyositis. Electronically Signed   By: Tollie Eth M.D.   On: 03/18/2018 22:26   Dg Chest Portable 1 View  Result Date: 03/18/2018 CLINICAL DATA:  Pain and vomiting.  Renal failure EXAM: PORTABLE CHEST 1 VIEW COMPARISON:  February 14, 2017 FINDINGS: No edema or consolidation. Heart is upper normal in size with pulmonary vascularity normal. No adenopathy. Postoperative changes noted in the thoracic and upper lumbar regions. IMPRESSION: No edema or consolidation.  Heart upper normal in size. Electronically Signed   By: Bretta Bang III M.D.   On: 03/18/2018 08:48   Dg Foot  Complete Right  Result Date: 03/18/2018 CLINICAL DATA:  Wound fifth metatarsal region EXAM: RIGHT FOOT COMPLETE - 3+ VIEW COMPARISON:  December 25, 2017 FINDINGS:  Frontal, oblique, and lateral views were obtained. Patient has had previous amputation at the fourth and fifth MTP joint levels. There is soft tissue ulceration along the lateral distal foot region. There is loss cortex along a portion of the lateral aspect of the distal most aspect of the fifth metatarsal, concerning for osteomyelitis. No other bony destruction evident. There is diffuse osteoporosis. There are flexion deformities of the first IP as well as the second and third PIP and DIP joints. There is moderate narrowing of the first MTP joint. No erosive changes. There is moderate soft tissue swelling. IMPRESSION: 1. Soft tissue ulceration along the lateral distal aspect of the foot with underlying loss of cortex along the lateral aspect of the distal most aspect of the fifth metatarsal, felt to represent localized osteomyelitis. No other bony destruction evident. 2. Status post previous amputations at the fourth and fifth MTP joint levels. 3.  Diffuse osteoporosis. 4. Narrowing first MTP joint. Flexion of the first IP as well as the second and third PIP and DIP joints. 5.  Moderate soft tissue swelling. Electronically Signed   By: Bretta Bang III M.D.   On: 03/18/2018 08:47     CBC Recent Labs  Lab 03/18/18 0810 03/19/18 0720  WBC 9.3 5.3  HGB 8.7* 7.2*  HCT 31.6* 25.9*  PLT 522* 367  MCV 64.4* 64.8*  MCH 17.7* 18.0*  MCHC 27.5* 27.8*  RDW 20.5* 20.4*    Chemistries  Recent Labs  Lab 03/18/18 0810 03/19/18 0720  NA 143 142  K 3.4* 3.3*  CL 106 111  CO2 27 23  GLUCOSE 146* 87  BUN 14 16  CREATININE 0.50* 0.38*  CALCIUM 9.1 8.0*  AST 17  --   ALT 13  --   ALKPHOS 82  --   BILITOT 0.4  --     ------------------------------------------------------------------------------------------------------------------ estimated creatinine clearance is 88.8 mL/min (A) (by C-G formula based on SCr of 0.38 mg/dL (L)). ------------------------------------------------------------------------------------------------------------------ No results for input(s): HGBA1C in the last 72 hours. ------------------------------------------------------------------------------------------------------------------ No results for input(s): CHOL, HDL, LDLCALC, TRIG, CHOLHDL, LDLDIRECT in the last 72 hours. ------------------------------------------------------------------------------------------------------------------ No results for input(s): TSH, T4TOTAL, T3FREE, THYROIDAB in the last 72 hours.  Invalid input(s): FREET3 ------------------------------------------------------------------------------------------------------------------ No results for input(s): VITAMINB12, FOLATE, FERRITIN, TIBC, IRON, RETICCTPCT in the last 72 hours.  Coagulation profile No results for input(s): INR, PROTIME in the last 168 hours.  No results for input(s): DDIMER in the last 72 hours.  Cardiac Enzymes No results for input(s): CKMB, TROPONINI, MYOGLOBIN in the last 168 hours.  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  1.  Nausea vomiting/abdominal pain- now improved could be related to his osteomyelitis   2.  Right foot  osteomyelitis- Continue IV antibiotic Plan for surgery later today  3.  Hypokalemia-will replace potassium orally.  Repeat level in the morning.  4.  Anemia-this is anemia of chronic disease.  Hemoglobin stable no acute need for transfusion.  5.  Urinary incontinence-continue oxybutynin.     Code Status Orders  (From admission, onward)         Start     Ordered   03/18/18 1120  Full  code  Continuous     03/18/18 1119        Code Status History    Date Active Date Inactive Code Status Order ID Comments User Context   02/14/2017 2321 02/19/2017 2046 Full Code [id number]  Oralia Manis, MD Inpatient   11/19/2016 1720 11/21/2016 2207 Full Code [id number]  Auburn Bilberry, MD Inpatient   04/15/2016 1802 04/19/2016 0211 Full Code [id number]  Shaune Pollack, MD Inpatient   04/29/2015 1017 04/30/2015 1339 Full Code [id number]  Linus Galas, MD Inpatient   04/28/2015 0438 04/29/2015 1017 Full Code [id number]  Milagros Loll, MD ED           Consults podiatry  DVT Prophylaxis  Lovenox   Lab Results  Component Value Date   PLT 367 03/19/2018     Time Spent in minutes 35 minutes greater than 50% of time spent in care coordination and counseling patient regarding the condition and plan of care.   Auburn Bilberry M.D on 03/19/2018 at 12:51 PM  Between 7am to 6pm - Pager - [phone]  After 6pm go to [email or web address] - Social research officer, government  [employer]   Office  [phone]

## 2018-03-19 NOTE — Transfer of Care (Signed)
Immediate Anesthesia Transfer of Care Note  Patient: AREND BAHL  Procedure(s) Performed: 5th Metatarsal Resection (Right Foot)  Patient Location: PACU  Anesthesia Type:MAC  Level of Consciousness: awake, alert , oriented and patient cooperative  Airway & Oxygen Therapy: Patient Spontanous Breathing  Post-op Assessment: Report given to RN and Post -op Vital signs reviewed and stable  Post vital signs: Reviewed and stable  Last Vitals:  Vitals Value Taken Time  BP    Temp    Pulse 82 03/19/2018  3:13 PM  Resp 17 03/19/2018  3:13 PM  SpO2 100 % 03/19/2018  3:13 PM  Vitals shown include unvalidated device data.  Last Pain:  Vitals:   03/19/18 1259  TempSrc: Tympanic  PainSc: 0-No pain         Complications: No apparent anesthesia complications

## 2018-03-19 NOTE — Anesthesia Postprocedure Evaluation (Signed)
Anesthesia Post Note  Patient: Jonathon York  Procedure(s) Performed: 5th Metatarsal Resection (Right Foot)  Patient location during evaluation: PACU Anesthesia Type: MAC Level of consciousness: awake and alert Pain management: pain level controlled Vital Signs Assessment: post-procedure vital signs reviewed and stable Respiratory status: spontaneous breathing, nonlabored ventilation, respiratory function stable and patient connected to nasal cannula oxygen Cardiovascular status: stable and blood pressure returned to baseline Postop Assessment: no apparent nausea or vomiting Anesthetic complications: no     Last Vitals:  Vitals:   03/19/18 1602 03/19/18 1626  BP: (!) 100/47 (!) 99/53  Pulse: (!) 45 (!) 52  Resp: 18 18  Temp: 36.4 C (!) 36.4 C  SpO2: 100% 100%    Last Pain:  Vitals:   03/19/18 1950  TempSrc:   PainSc: 0-No pain                 Michol Emory S

## 2018-03-20 ENCOUNTER — Encounter: Payer: Self-pay | Admitting: Podiatry

## 2018-03-20 DIAGNOSIS — D649 Anemia, unspecified: Secondary | ICD-10-CM

## 2018-03-20 DIAGNOSIS — M869 Osteomyelitis, unspecified: Secondary | ICD-10-CM

## 2018-03-20 DIAGNOSIS — R110 Nausea: Secondary | ICD-10-CM

## 2018-03-20 DIAGNOSIS — Z91018 Allergy to other foods: Secondary | ICD-10-CM

## 2018-03-20 DIAGNOSIS — Z872 Personal history of diseases of the skin and subcutaneous tissue: Secondary | ICD-10-CM

## 2018-03-20 DIAGNOSIS — Z960 Presence of urogenital implants: Secondary | ICD-10-CM

## 2018-03-20 DIAGNOSIS — N319 Neuromuscular dysfunction of bladder, unspecified: Secondary | ICD-10-CM

## 2018-03-20 DIAGNOSIS — Z87891 Personal history of nicotine dependence: Secondary | ICD-10-CM

## 2018-03-20 DIAGNOSIS — L089 Local infection of the skin and subcutaneous tissue, unspecified: Secondary | ICD-10-CM

## 2018-03-20 DIAGNOSIS — R109 Unspecified abdominal pain: Secondary | ICD-10-CM

## 2018-03-20 DIAGNOSIS — G8220 Paraplegia, unspecified: Secondary | ICD-10-CM

## 2018-03-20 DIAGNOSIS — Z89421 Acquired absence of other right toe(s): Secondary | ICD-10-CM

## 2018-03-20 DIAGNOSIS — K5900 Constipation, unspecified: Secondary | ICD-10-CM

## 2018-03-20 LAB — BASIC METABOLIC PANEL
ANION GAP: 10 (ref 5–15)
BUN: 12 mg/dL (ref 6–20)
CO2: 22 mmol/L (ref 22–32)
CREATININE: 0.48 mg/dL — AB (ref 0.61–1.24)
Calcium: 8.3 mg/dL — ABNORMAL LOW (ref 8.9–10.3)
Chloride: 110 mmol/L (ref 98–111)
GFR calc Af Amer: >60 mL/min (ref >60)
Glucose, Bld: 113 mg/dL — ABNORMAL HIGH (ref 70–99)
Potassium: 3.3 mmol/L — ABNORMAL LOW (ref 3.5–5.1)
Sodium: 142 mmol/L (ref 135–145)

## 2018-03-20 LAB — URINE CULTURE: Culture: >=100000 — AB

## 2018-03-20 LAB — CBC
HCT: 30.3 % — ABNORMAL LOW (ref 39.0–52.0)
Hemoglobin: 8.2 g/dL — ABNORMAL LOW (ref 13.0–17.0)
MCH: 17.7 pg — ABNORMAL LOW (ref 26.0–34.0)
MCHC: 27.1 g/dL — AB (ref 30.0–36.0)
MCV: 65.3 fL — AB (ref 80.0–100.0)
NRBC: 0 % (ref 0.0–0.2)
PLATELETS: 474 10*3/uL — AB (ref 150–400)
RBC: 4.64 MIL/uL (ref 4.22–5.81)
RDW: 20.5 % — AB (ref 11.5–15.5)
WBC: 7.7 10*3/uL (ref 4.0–10.5)

## 2018-03-20 LAB — VANCOMYCIN, TROUGH: Vancomycin Tr: 11 ug/mL — ABNORMAL LOW (ref 15–20)

## 2018-03-20 LAB — HIV ANTIBODY (ROUTINE TESTING W REFLEX): HIV Screen 4th Generation wRfx: NONREACTIVE

## 2018-03-20 MED ORDER — ONDANSETRON HCL 4 MG/2ML IJ SOLN
4.0000 mg | INTRAMUSCULAR | Status: AC | PRN
  Administered 2018-03-20 (×2): 4 mg via INTRAVENOUS
  Filled 2018-03-20 (×2): qty 2

## 2018-03-20 MED ORDER — JUVEN PO PACK
1.0000 | PACK | Freq: Two times a day (BID) | ORAL | Status: DC
  Administered 2018-03-20: 1 via ORAL

## 2018-03-20 MED ORDER — PANTOPRAZOLE SODIUM 40 MG IV SOLR
40.0000 mg | Freq: Two times a day (BID) | INTRAVENOUS | Status: DC
  Administered 2018-03-20 – 2018-03-22 (×5): 40 mg via INTRAVENOUS
  Filled 2018-03-20 (×5): qty 40

## 2018-03-20 MED ORDER — POTASSIUM CHLORIDE 10 MEQ/100ML IV SOLN
10.0000 meq | INTRAVENOUS | Status: AC
  Administered 2018-03-20 (×2): 10 meq via INTRAVENOUS
  Filled 2018-03-20 (×2): qty 100

## 2018-03-20 MED ORDER — ADULT MULTIVITAMIN W/MINERALS CH
1.0000 | ORAL_TABLET | Freq: Every day | ORAL | Status: DC
  Administered 2018-03-21 – 2018-03-23 (×3): 1 via ORAL
  Filled 2018-03-20 (×3): qty 1

## 2018-03-20 MED ORDER — ENSURE ENLIVE PO LIQD
237.0000 mL | Freq: Three times a day (TID) | ORAL | Status: DC
  Administered 2018-03-20 – 2018-03-21 (×3): 237 mL via ORAL

## 2018-03-20 MED ORDER — OCUVITE-LUTEIN PO CAPS
1.0000 | ORAL_CAPSULE | Freq: Every day | ORAL | Status: DC
  Administered 2018-03-20 – 2018-03-22 (×3): 1 via ORAL
  Filled 2018-03-20 (×4): qty 1

## 2018-03-20 MED ORDER — VANCOMYCIN HCL IN DEXTROSE 750-5 MG/150ML-% IV SOLN
750.0000 mg | Freq: Three times a day (TID) | INTRAVENOUS | Status: DC
  Administered 2018-03-20: 18:00:00 750 mg via INTRAVENOUS
  Filled 2018-03-20 (×3): qty 150

## 2018-03-20 MED ORDER — METRONIDAZOLE 500 MG PO TABS
500.0000 mg | ORAL_TABLET | Freq: Three times a day (TID) | ORAL | Status: DC
  Administered 2018-03-20 – 2018-03-23 (×8): 500 mg via ORAL
  Filled 2018-03-20 (×8): qty 1

## 2018-03-20 MED ORDER — ONDANSETRON HCL 4 MG PO TABS: 4.0000 mg | ORAL_TABLET | Freq: Four times a day (QID) | ORAL | Status: DC | PRN

## 2018-03-20 MED ORDER — ONDANSETRON HCL 4 MG/2ML IJ SOLN
4.0000 mg | Freq: Four times a day (QID) | INTRAMUSCULAR | Status: DC | PRN
  Administered 2018-03-20 – 2018-03-21 (×2): 4 mg via INTRAVENOUS
  Filled 2018-03-20 (×2): qty 2

## 2018-03-20 MED ORDER — VITAMIN C 500 MG PO TABS
500.0000 mg | ORAL_TABLET | Freq: Two times a day (BID) | ORAL | Status: DC
  Administered 2018-03-20 – 2018-03-23 (×7): 500 mg via ORAL
  Filled 2018-03-20 (×7): qty 1

## 2018-03-20 MED ORDER — VANCOMYCIN HCL IN DEXTROSE 1-5 GM/200ML-% IV SOLN
1000.0000 mg | Freq: Three times a day (TID) | INTRAVENOUS | Status: DC
  Administered 2018-03-21 – 2018-03-23 (×7): 1000 mg via INTRAVENOUS
  Filled 2018-03-20 (×12): qty 200

## 2018-03-20 MED ORDER — SODIUM CHLORIDE 0.9 % IV SOLN: 8.0000 mg | Freq: Once | INTRAVENOUS | Status: DC

## 2018-03-20 MED ORDER — SODIUM CHLORIDE 0.9 % IV SOLN
INTRAVENOUS | Status: DC
  Administered 2018-03-20 – 2018-03-21 (×3): via INTRAVENOUS

## 2018-03-20 NOTE — Progress Notes (Addendum)
Pharmacy Antibiotic Note  Jonathon York is a 42 y.o. male admitted on 03/18/2018 with sepsis. Patient is paraplegic, has a chronic foley catheter, and a right foot wound. 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 Pharmacy has been consulted for Vancomycin, Cefepime, Flagyl dosing.  Plan: Patient received Cefepime 2 g IV x 1 and Vancomycin 1 g x 1 in the ED.   Spoke with physician about continuing Cefepime/Vancomycin/Flagyl instead of Zosyn/Vancomycin due to nephrotoxicity especially in the setting of paraplegia.  Vancomycin Kinetics Using adjusted CrCl for paraplegia 71 mL/min and actual BW of 52.2 kg ke 0.063 Vd 36.5 T1/2 11 hrs  Current dosing Cefepime 2g q12 Vancomycin 750mg  q 8h  Height: 5\' 9"  (175.3 cm) Weight: 115 lb (52.2 kg) IBW/kg (Calculated) : 70.7  Temp (24hrs), Avg:97.7 F (36.5 C), Min:97.2 F (36.2 C), Max:98.5 F (36.9 C)  Recent Labs  Lab 03/18/18 0810 03/19/18 0720 03/19/18 2015 03/20/18 0525  WBC 9.3 5.3  --  7.7  CREATININE 0.50* 0.38*  --  0.48*  VANCOTROUGH  --   --  14*  --     Estimated Creatinine Clearance: 88.8 mL/min (A) (by C-G formula based on SCr of 0.48 mg/dL (L)).    Allergies  Allergen Reactions  . Orange Fruit [Citrus] Other (See Comments)    blisters    Antimicrobials this admission:  Cefepime 11/20 >>   Vancomycin 11/20 >>  Metronidazole 11/20 >>  Dose adjustments this admission: 11/21 Adj Vanc dose from 750 q 12h to 750 q 8h per trough level 14  11/22 - late dose - intended 0730 - given 0930 - adjusted next dose and trough times  Currently next dose due 1730, with trough @1700  (30 minutes prior) Will reassess dosing at that time - will be a slightly LOWER trough than a true steady state, but should provide relevant information  Microbiology results:  BCx:  11/20 No growth x 2 days  UCx:  11/20 Final 11/22 Acinetobacter Cefepime sensitive  Mrsa pcr neg  Bone/Wound  culture 11/22 >> pending   Thank you for allowing pharmacy to be a part of this patient's care.  Albina Billetharles M Snow Peoples, PharmD Clinical Pharmacist 03/20/2018 9:46 AM

## 2018-03-20 NOTE — Progress Notes (Signed)
[facility]                                                      Patient Demographics  Jonathon LyRichard Muscato, is a 42 y.o. male   MRN: [id number]   DOB - [birth year]  Admit Date - 03/18/2018    Outpatient Primary MD for the patient is Dan Humphre[place], [facility]  Consult requested in the Hospital by Auburn BilberryPatel, Shreyang, MD, On 03/20/2018   With History of -  Past Medical History:  Diagnosis Date  . Anxiety   . Decubitus ulcer   . Depression   . Neurogenic bladder   . Osteomyelitis (HCC)   . 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 AMPUTATION TOE Right 04/29/2015   Procedure: AMPUTATION TOE;  Surgeon: Linus Galasodd Cline, MD;  Location: [facility] ORS;  Service: Podiatry;  Laterality: Right;  . IRRIGATION AND DEBRIDEMENT BUTTOCKS    . TOE AMPUTATION Right     in for   Chief Complaint  Patient presents with  . Abdominal Pain     HPI  Jonathon York  is a 42 y.o. male, 1 day status post resection of fifth metatarsal right foot due to osteomyelitis    Review of Systems    In addition to the HPI above,  No Fever-chills, No Headache, No changes with Vision or hearing, No problems swallowing food or Liquids, No Chest pain, Cough or Shortness of Breath, No Abdominal pain, No Nausea or Vommitting, Bowel movements are regular, No Blood in stool or Urine, No dysuria, No new skin rashes or bruises, No new joints pains-aches,  No new weakness, tingling, numbness in any extremity, No recent weight gain or loss, No polyuria, polydypsia or polyphagia, No significant Mental Stressors.  A full 10 point Review of Systems was done, except as stated above, all other Review of Systems were negative.   Social History Social History   Tobacco Use  . Smoking status: Former Smoker    Packs/day: 0.50   Types: Cigarettes  . Smokeless tobacco: Never Used  Substance Use Topics  . Alcohol use: No    Family History Family History  Problem Relation Age of Onset  . Lymphoma Sister     Prior to Admission medications   Medication Sig Start Date End Date Taking? Authorizing Provider  Amino Acids-Protein Hydrolys (FEEDING SUPPLEMENT, PRO-STAT SUGAR FREE 64,) LIQD Take 30 mLs by mouth 2 (two) times daily. Patient not taking: Reported on 03/18/2018 11/21/16   Altamese DillingVachhani, Vaibhavkumar, MD  feeding supplement, ENSURE ENLIVE, (ENSURE ENLIVE) LIQD Take 237 mLs by mouth 2 (two) times daily between meals. Patient not taking: Reported on 03/18/2018 11/21/16   Altamese DillingVachhani, Vaibhavkumar, MD  HYDROcodone-acetaminophen (NORCO/VICODIN) 5-325 MG tablet Take 1-2 tablets by mouth every 6 (six) hours as needed for moderate pain. Patient not taking: Reported on 03/18/2018 02/19/17   Enedina FinnerPatel, Sona, MD  megestrol (MEGACE) 40 MG/ML suspension Take 20 mLs (800 mg total) by mouth daily. Patient not taking: Reported on 03/18/2018 02/19/17   Enedina FinnerPatel, Sona, MD  Multiple Vitamin (MULTIVITAMIN WITH MINERALS) TABS tablet Take 1 tablet by mouth daily. Patient not taking: Reported on 03/18/2018 11/22/16   Altamese DillingVachhani, Vaibhavkumar, MD  nicotine (NICODERM CQ - DOSED IN MG/24 HR) 7 mg/24hr patch Place 1 patch (  7 mg total) onto the skin daily. Patient not taking: Reported on 03/18/2018 11/22/16   Altamese Dilling, MD  oxybutynin (DITROPAN) 5 MG tablet Take 15 mg by mouth 2 (two) times daily.    [provider]  sulfamethoxazole-trimethoprim (BACTRIM DS,SEPTRA DS) 800-160 MG tablet Take 1 tablet by mouth 2 (two) times daily. Patient not taking: Reported on 03/18/2018 12/25/17   Jeanmarie Plant, MD  vitamin C (VITAMIN C) 500 MG tablet Take 1 tablet (500 mg total) by mouth 2 (two) times daily. Patient not taking: Reported on 03/18/2018 02/19/17   Enedina Finner, MD    Anti-infectives (From admission, onward)   Start     Dose/Rate  Route Frequency Ordered Stop   03/20/18 1730  vancomycin (VANCOCIN) IVPB 750 mg/150 ml premix     750 mg 150 mL/hr over 60 Minutes Intravenous Every 8 hours 03/20/18 0932     03/19/18 2200  vancomycin (VANCOCIN) IVPB 750 mg/150 ml premix  Status:  Discontinued     750 mg 150 mL/hr over 60 Minutes Intravenous Every 8 hours 03/19/18 2058 03/20/18 0932   03/19/18 1458  vancomycin (VANCOCIN) powder  Status:  Discontinued       As needed 03/19/18 1458 03/19/18 1509   03/18/18 2300  vancomycin (VANCOCIN) IVPB 750 mg/150 ml premix  Status:  Discontinued     750 mg 150 mL/hr over 60 Minutes Intravenous Every 12 hours 03/18/18 1018 03/19/18 2058   03/18/18 2200  ceFEPIme (MAXIPIME) 2 g in sodium chloride 0.9 % 100 mL IVPB     2 g 200 mL/hr over 30 Minutes Intravenous Every 12 hours 03/18/18 1018     03/18/18 0930  ceFEPIme (MAXIPIME) 2 g in sodium chloride 0.9 % 100 mL IVPB     2 g 200 mL/hr over 30 Minutes Intravenous  Once 03/18/18 0926 03/18/18 1020   03/18/18 0930  metroNIDAZOLE (FLAGYL) IVPB 500 mg     500 mg 100 mL/hr over 60 Minutes Intravenous Every 8 hours 03/18/18 0926     03/18/18 0930  vancomycin (VANCOCIN) IVPB 1000 mg/200 mL premix     1,000 mg 200 mL/hr over 60 Minutes Intravenous  Once 03/18/18 0926 03/18/18 1241      Scheduled Meds: . enoxaparin (LOVENOX) injection  40 mg Subcutaneous Q24H  . oxybutynin  15 mg Oral BID  . pantoprazole (PROTONIX) IV  40 mg Intravenous Q12H   Continuous Infusions: . sodium chloride 75 mL/hr at 03/20/18 0953  . ceFEPime (MAXIPIME) IV Stopped (03/19/18 2337)  . metronidazole Stopped (03/20/18 0557)  . vancomycin     PRN Meds:.acetaminophen **OR** acetaminophen, HYDROcodone-acetaminophen, ketorolac, lactulose, ondansetron (ZOFRAN) IV **OR** ondansetron, polyethylene glycol, promethazine  Allergies  Allergen Reactions  . Orange Fruit [Citrus] Other (See Comments)    blisters    Physical Exam  Vitals  Blood pressure (!) 122/56,  pulse 67, temperature 98.2 F (36.8 C), temperature source Oral, resp. rate 20, height 5\' 9"  (1.753 m), weight 52.2 kg, SpO2 100 %.  Lower Extremity exam: Bandages clean dry and intact patient does not have any pain or discomfort overall stable  Data Review  CBC Recent Labs  Lab 03/18/18 0810 03/19/18 0720 03/20/18 0525  WBC 9.3 5.3 7.7  HGB 8.7* 7.2* 8.2*  HCT 31.6* 25.9* 30.3*  PLT 522* 367 474*  MCV 64.4* 64.8* 65.3*  MCH 17.7* 18.0* 17.7*  MCHC 27.5* 27.8* 27.1*  RDW 20.5* 20.4* 20.5*   ------------------------------------------------------------------------------------------------------------------  Chemistries  Recent Labs  Lab 03/18/18 0810 03/19/18  0720 03/20/18 0525  NA 143 142 142  K 3.4* 3.3* 3.3*  CL 106 111 110  CO2 27 23 22   GLUCOSE 146* 87 113*  BUN 14 16 12   CREATININE 0.50* 0.38* 0.48*  CALCIUM 9.1 8.0* 8.3*  AST 17  --   --   ALT 13  --   --   ALKPHOS 82  --   --   BILITOT 0.4  --   --    -------------Assessment & Plan: Patient's vitals are stable he is having some problems with nausea but to try to get that under control.  He is on Zofran and started him on Protonix recently.  We will leave the dressing intact today I will change tomorrow basically have to wait for culture reports on the bone as well as the pathology report to make sure there is no evidence of osteo-at the proximal margins.  Active Problems:   Acute osteomyelitis of right foot (HCC)   Pressure injury of skin   Family Communication: Plan discussed with patient   Recardo Evangelist M.D on 03/20/2018 at 10:12 AM  Thank you for the consult, we will follow the patient with you in the Hospital.

## 2018-03-20 NOTE — Progress Notes (Signed)
[employer] [facility] - [place] at [facility]                                                                                                                                                                                  Patient Demographics   Jonathon York, is a 42 y.o. male, DOB - 26-Sep-1975, UJW:[id number]  Admit date - 03/18/2018   Admitting Physician Houston Siren, MD  Outpatient Primary MD for the patient is [place], [facility]   LOS - 2  Subjective: Patient requesting his Protonix patient restarted states that he is continued to be nauseous    Review of Systems:   CONSTITUTIONAL: No documented fever. No fatigue, weakness. No weight gain, no weight loss.  EYES: No blurry or double vision.  ENT: No tinnitus. No postnasal drip. No redness of the oropharynx.  RESPIRATORY: No cough, no wheeze, no hemoptysis. No dyspnea.  CARDIOVASCULAR: No chest pain. No orthopnea. No palpitations. No syncope.  GASTROINTESTINAL: No nausea, no vomiting or diarrhea. No abdominal pain. No melena or hematochezia.  GENITOURINARY: No dysuria or hematuria.  ENDOCRINE: No polyuria or nocturia. No heat or cold intolerance.  HEMATOLOGY: No anemia. No bruising. No bleeding.  INTEGUMENTARY: No rashes. No lesions.  MUSCULOSKELETAL: Right foot osteomyelitis.  NEUROLOGIC: No numbness, tingling, or ataxia. No seizure-type activity.  PSYCHIATRIC: No anxiety. No insomnia. No ADD.    Vitals:   Vitals:   03/19/18 1626 03/19/18 2201 03/20/18 0534 03/20/18 1319  BP: (!) 99/53 117/62 (!) 122/56 (!) 93/55  Pulse: (!) 52 64 67 67  Resp: 18 20 20 20   Temp: (!) 97.5 F (36.4 C) 98.5 F (36.9 C) 98.2 F (36.8 C) 98.2 F (36.8 C)  TempSrc: Oral Oral Oral Oral  SpO2: 100% 100% 100% 98%  Weight:      Height:        Wt Readings from Last 3 Encounters:  03/18/18 52.2 kg  12/25/17 45.4 kg  02/14/17 43.1 kg     Intake/Output Summary (Last 24 hours) at 03/20/2018 1403 Last data filed at  03/20/2018 0208 Gross per 24 hour  Intake 500 ml  Output 780 ml  Net -280 ml    Physical Exam:   GENERAL: Pleasant-appearing in no apparent distress.  HEAD, EYES, EARS, NOSE AND THROAT: Atraumatic, normocephalic. Extraocular muscles are intact. Pupils equal and reactive to light. Sclerae anicteric. No conjunctival injection. No oro-pharyngeal erythema.  NECK: Supple. There is no jugular venous distention. No bruits, no lymphadenopathy, no thyromegaly.  HEART: Regular rate and rhythm,. No murmurs, no rubs, no clicks.  LUNGS: Clear to auscultation bilaterally. No rales or rhonchi. No wheezes.  ABDOMEN: Soft, flat, nontender, nondistended. Has  good bowel sounds. No hepatosplenomegaly appreciated.  EXTREMITIES: No evidence of any cyanosis, clubbing, or peripheral edema.  +2 pedal and radial pulses bilaterally.  Right foot dressing in place NEUROLOGIC: The patient is alert, awake, and oriented x3 with no focal motor or sensory deficits appreciated bilaterally.  SKIN: Moist and warm with no rashes appreciated.  Psych: Not anxious, depressed LN: No inguinal LN enlargement    Antibiotics   Anti-infectives (From admission, onward)   Start     Dose/Rate Route Frequency Ordered Stop   03/20/18 1730  vancomycin (VANCOCIN) IVPB 750 mg/150 ml premix     750 mg 150 mL/hr over 60 Minutes Intravenous Every 8 hours 03/20/18 0932     03/20/18 1200  metroNIDAZOLE (FLAGYL) tablet 500 mg     500 mg Oral Every 8 hours 03/20/18 1156     03/19/18 2200  vancomycin (VANCOCIN) IVPB 750 mg/150 ml premix  Status:  Discontinued     750 mg 150 mL/hr over 60 Minutes Intravenous Every 8 hours 03/19/18 2058 03/20/18 0932   03/19/18 1458  vancomycin (VANCOCIN) powder  Status:  Discontinued       As needed 03/19/18 1458 03/19/18 1509   03/18/18 2300  vancomycin (VANCOCIN) IVPB 750 mg/150 ml premix  Status:  Discontinued     750 mg 150 mL/hr over 60 Minutes Intravenous Every 12 hours 03/18/18 1018 03/19/18 2058    03/18/18 2200  ceFEPIme (MAXIPIME) 2 g in sodium chloride 0.9 % 100 mL IVPB     2 g 200 mL/hr over 30 Minutes Intravenous Every 12 hours 03/18/18 1018     03/18/18 0930  ceFEPIme (MAXIPIME) 2 g in sodium chloride 0.9 % 100 mL IVPB     2 g 200 mL/hr over 30 Minutes Intravenous  Once 03/18/18 0926 03/18/18 1020   03/18/18 0930  metroNIDAZOLE (FLAGYL) IVPB 500 mg  Status:  Discontinued     500 mg 100 mL/hr over 60 Minutes Intravenous Every 8 hours 03/18/18 0926 03/20/18 1156   03/18/18 0930  vancomycin (VANCOCIN) IVPB 1000 mg/200 mL premix     1,000 mg 200 mL/hr over 60 Minutes Intravenous  Once 03/18/18 0926 03/18/18 1241      Medications   Scheduled Meds: . enoxaparin (LOVENOX) injection  40 mg Subcutaneous Q24H  . feeding supplement (ENSURE ENLIVE)  237 mL Oral TID BM  . metroNIDAZOLE  500 mg Oral Q8H  . [START ON 03/21/2018] multivitamin with minerals  1 tablet Oral Daily  . multivitamin-lutein  1 capsule Oral Daily  . nutrition supplement (JUVEN)  1 packet Oral BID BM  . oxybutynin  15 mg Oral BID  . pantoprazole (PROTONIX) IV  40 mg Intravenous Q12H  . vitamin C  500 mg Oral BID   Continuous Infusions: . sodium chloride 75 mL/hr at 03/20/18 0953  . ceFEPime (MAXIPIME) IV 2 g (03/20/18 1051)  . potassium chloride 10 mEq (03/20/18 1251)  . vancomycin     PRN Meds:.acetaminophen **OR** acetaminophen, HYDROcodone-acetaminophen, ketorolac, lactulose, ondansetron (ZOFRAN) IV **OR** ondansetron, polyethylene glycol, promethazine   Data Review:   Micro Results Recent Results (from the past 240 hour(s))  Urine culture     Status: Abnormal   Collection Time: 03/18/18  8:10 AM  Result Value Ref Range Status   Specimen Description   Final    URINE, RANDOM Performed at [facility], [address] [place], [place] [address]    Special Requests   Final    NONE Performed at [facility], [address], [place], [place] [address]    Culture (A)  Final     >=100,000 COLONIES/mL ACINETOBACTER CALCOACETICUS/BAUMANNII COMPLEX   Report Status 03/20/2018 FINAL  Final   Organism ID, Bacteria ACINETOBACTER CALCOACETICUS/BAUMANNII COMPLEX (A)  Final      Susceptibility   Acinetobacter calcoaceticus/baumannii complex - MIC*    CEFTAZIDIME <=1 SENSITIVE Sensitive     CEFTRIAXONE <=1 SENSITIVE Sensitive     CIPROFLOXACIN <=0.25 SENSITIVE Sensitive     GENTAMICIN 4 SENSITIVE Sensitive     IMIPENEM 1 SENSITIVE Sensitive     PIP/TAZO <=4 SENSITIVE Sensitive     TRIMETH/SULFA <=20 SENSITIVE Sensitive     CEFEPIME <=1 SENSITIVE Sensitive     AMPICILLIN/SULBACTAM 16 INTERMEDIATE Intermediate     * >=100,000 COLONIES/mL ACINETOBACTER CALCOACETICUS/BAUMANNII COMPLEX  Blood culture (single)     Status: None (Preliminary result)   Collection Time: 03/18/18  8:10 AM  Result Value Ref Range Status   Specimen Description BLOOD LEFT WRIST  Final   Special Requests   Final    BOTTLES DRAWN AEROBIC AND ANAEROBIC Blood Culture results may not be optimal due to an excessive volume of blood received in culture bottles   Culture   Final    NO GROWTH 2 DAYS Performed at [facility], [address] [place], [place] [address]    Report Status PENDING  Incomplete  MRSA PCR Screening     Status: None   Collection Time: 03/18/18  2:38 PM  Result Value Ref Range Status   MRSA by PCR NEGATIVE NEGATIVE Final    Comment:        The GeneXpert MRSA Assay (FDA approved for NASAL specimens only), is one component of a comprehensive MRSA colonization surveillance program. It is not intended to diagnose MRSA infection nor to guide or monitor treatment for MRSA infections. Performed at [facility], [address] [place], [place] [address]   Aerobic/Anaerobic Culture (surgical/deep wound)     Status: None (Preliminary result)   Collection Time: 03/19/18  2:43 PM  Result Value Ref Range Status   Specimen Description   Final    BONE Performed at  [facility], [address], [place], [place] [address]    Special Requests   Final    RIGHT 5TH METATARSAL Performed at [facility], [address] [place], [place] [address]    Gram Stain NO WBC SEEN RARE GRAM NEGATIVE RODS   Final   Culture   Final    TOO YOUNG TO READ Performed at [facility], [address] [place], [place] [address]    Report Status PENDING  Incomplete    Radiology Reports Ct Abdomen Pelvis W Contrast  Result Date: 03/18/2018 CLINICAL DATA:  Vomiting, abdominal pain. EXAM: CT ABDOMEN AND PELVIS WITH CONTRAST TECHNIQUE: Multidetector CT imaging of the abdomen and pelvis was performed using the standard protocol following bolus administration of intravenous contrast. CONTRAST:  OMNIPAQUE IOHEXOL 300 MG/ML  SOLN COMPARISON:  02/15/2017 FINDINGS: Lower chest: Lung bases are clear. No effusions. Heart is normal size. Hepatobiliary: No focal hepatic abnormality. Gallbladder unremarkable. Tiny cyst centrally in the right hepatic lobe. Pancreas: No focal abnormality or ductal dilatation. Spleen: No focal abnormality.  Normal size. Adrenals/Urinary Tract: Small left renal cysts. No hydronephrosis bilaterally. Adrenal glands unremarkable. Urinary bladder decompressed with Foley catheter in place. Stomach/Bowel: Moderate stool burden throughout the colon. Appendix is normal. Stomach and small bowel grossly unremarkable. No evidence of bowel obstruction.  Vascular/Lymphatic: Aortic atherosclerosis. No enlarged abdominal or pelvic lymph nodes. Reproductive: No visible focal abnormality. Other: No free fluid or free air. Musculoskeletal: Extensive decubitus ulcers noted over the sacrum and ischium bilaterally, most pronounced over the right ischium. Sclerosis noted within the right ischium and sacrum which may reflect chronic osteomyelitis. IMPRESSION: Moderate stool burden throughout the colon. Chronic decubitus ulcers over the ischium bilaterally and  sacrum with sclerosis throughout the right ischium and sacrum, likely chronic osteomyelitis. Chronic indwelling Foley catheter noted with bladder decompressed. Scattered aortic atherosclerosis. No acute findings in the abdomen or pelvis. Electronically Signed   By: Charlett NoseKevin  Dover M.D.   On: 03/18/2018 09:47   Mr Foot Right Wo Contrast  Result Date: 03/18/2018 CLINICAL DATA:  42 year old male diabetic with foot swelling. Suspect osteomyelitis. EXAM: MRI OF THE RIGHT FOREFOOT WITHOUT CONTRAST TECHNIQUE: Multiplanar, multisequence MR imaging of the right foot was performed. No intravenous contrast was administered. COMPARISON:  Radiographs of the right foot from 12/25/2017 and 03/18/2018 FINDINGS: Bones/Joint/Cartilage Cortical bone destruction along the lateral aspect of the fifth metatarsal head with periosteal new bone formation of the neck and underlying marrow edema involving the distal third of the fifth metatarsal are in keeping with acute osteomyelitis. Soft tissue ulceration and attenuation is suggested adjacent to fifth metatarsal. First through fourth metatarsals are of normal signal intensity morphology. The first through third toes are intact. 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 Ligaments Noncontributory Muscles and Tendons No evidence of pyomyositis. Mild neurogenic changes of the plantar forefoot musculature. Soft tissues Soft tissue attenuation and ulceration adjacent to the fifth metatarsal head and shaft. IMPRESSION: Acute osteomyelitis with bone destruction and periosteal new bone formation involving the fifth metatarsal head and neck. Marrow signal abnormality involves the distal third of the fifth metatarsal with adjacent overlying soft tissue ulceration and edema. No drainable fluid collection or evidence of pyomyositis. Electronically Signed   By: Tollie Ethavid  Kwon M.D.   On: 03/18/2018 22:26   Dg Chest Portable 1 View  Result Date:  03/18/2018 CLINICAL DATA:  Pain and vomiting.  Renal failure EXAM: PORTABLE CHEST 1 VIEW COMPARISON:  February 14, 2017 FINDINGS: No edema or consolidation. Heart is upper normal in size with pulmonary vascularity normal. No adenopathy. Postoperative changes noted in the thoracic and upper lumbar regions. IMPRESSION: No edema or consolidation.  Heart upper normal in size. Electronically Signed   By: Bretta BangWilliam  Woodruff III M.D.   On: 03/18/2018 08:48   Dg Foot Complete Right  Result Date: 03/18/2018 CLINICAL DATA:  Wound fifth metatarsal region EXAM: RIGHT FOOT COMPLETE - 3+ VIEW COMPARISON:  December 25, 2017 FINDINGS: Frontal, oblique, and lateral views were obtained. Patient has had previous amputation at the fourth and fifth MTP joint levels. There is soft tissue ulceration along the lateral distal foot region. There is loss cortex along a portion of the lateral aspect of the distal most aspect of the fifth metatarsal, concerning for osteomyelitis. No other bony destruction evident. There is diffuse osteoporosis. There are flexion deformities of the first IP as well as the second and third PIP and DIP joints. There is moderate narrowing of the first MTP joint. No erosive changes. There is moderate soft tissue swelling. IMPRESSION: 1. Soft tissue ulceration along the lateral distal aspect of the foot with underlying loss of cortex along the lateral aspect of the distal most aspect of the fifth metatarsal, felt to represent localized osteomyelitis. No other bony destruction evident.  2. Status post previous amputations at the fourth and fifth MTP joint levels. 3.  Diffuse osteoporosis. 4. Narrowing first MTP joint. Flexion of the first IP as well as the second and third PIP and DIP joints. 5.  Moderate soft tissue swelling. Electronically Signed   By: Bretta Bang III M.D.   On: 03/18/2018 08:47     CBC Recent Labs  Lab 03/18/18 0810 03/19/18 0720 03/20/18 0525  WBC 9.3 5.3 7.7  HGB 8.7* 7.2* 8.2*   HCT 31.6* 25.9* 30.3*  PLT 522* 367 474*  MCV 64.4* 64.8* 65.3*  MCH 17.7* 18.0* 17.7*  MCHC 27.5* 27.8* 27.1*  RDW 20.5* 20.4* 20.5*    Chemistries  Recent Labs  Lab 03/18/18 0810 03/19/18 0720 03/20/18 0525  NA 143 142 142  K 3.4* 3.3* 3.3*  CL 106 111 110  CO2 27 23 22   GLUCOSE 146* 87 113*  BUN 14 16 12   CREATININE 0.50* 0.38* 0.48*  CALCIUM 9.1 8.0* 8.3*  AST 17  --   --   ALT 13  --   --   ALKPHOS 82  --   --   BILITOT 0.4  --   --    ------------------------------------------------------------------------------------------------------------------ estimated creatinine clearance is 88.8 mL/min (A) (by C-G formula based on SCr of 0.48 mg/dL (L)). ------------------------------------------------------------------------------------------------------------------ No results for input(s): HGBA1C in the last 72 hours. ------------------------------------------------------------------------------------------------------------------ No results for input(s): CHOL, HDL, LDLCALC, TRIG, CHOLHDL, LDLDIRECT in the last 72 hours. ------------------------------------------------------------------------------------------------------------------ No results for input(s): TSH, T4TOTAL, T3FREE, THYROIDAB in the last 72 hours.  Invalid input(s): FREET3 ------------------------------------------------------------------------------------------------------------------ No results for input(s): VITAMINB12, FOLATE, FERRITIN, TIBC, IRON, RETICCTPCT in the last 72 hours.  Coagulation profile No results for input(s): INR, PROTIME in the last 168 hours.  No results for input(s): DDIMER in the last 72 hours.  Cardiac Enzymes No results for input(s): CKMB, TROPONINI, MYOGLOBIN in the last 168 hours.  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  1.  Nausea vomiting/abdominal pain- Continue antiemetic We will place patient on IV Protonix  2.  Right foot osteomyelitis- Continue IV antibiotic Status post Excision of distal 3/4th of fifth metatarsal right foot Preliminary intraoperative cultures are showing gram-negative rods ID has seen the patient they state that he likely will need not need IV antibiotic      3.  Hypokalemia- will replace potassium again  4.  Anemia-this is anemia of chronic disease.  Hemoglobin stable no acute need for transfusion.  5.  Urinary incontinence-continue oxybutynin.  6.  Bedbound state    Code Status Orders  (From admission, onward)         Start     Ordered   03/18/18 1120  Full code  Continuous     03/18/18 1119        Code Status History    Date Active Date Inactive Code Status Order ID Comments User Context   02/14/2017 2321 02/19/2017 2046 Full Code [id number]  Oralia Manis, MD Inpatient   11/19/2016 1720 11/21/2016 2207 Full Code [id number]  Auburn Bilberry, MD Inpatient   04/15/2016 1802 04/19/2016 0211 Full Code [id number]  Shaune Pollack, MD Inpatient  04/29/2015 1017 04/30/2015 1339 Full Code [id number]  Linus Galas, MD Inpatient   04/28/2015 0438 04/29/2015 1017 Full Code [id number]  Milagros Loll, MD ED           Consults podiatry  DVT Prophylaxis  Lovenox   Lab Results  Component Value Date   PLT 474 (H) 03/20/2018     Time Spent in minutes 35 minutes greater than 50% of time spent in care coordination and counseling patient regarding the condition and plan of care.   Auburn Bilberry M.D on 03/20/2018 at  2:03 PM  Between 7am to 6pm - Pager - [phone]  After 6pm go to [email or web address] - Social research officer, government  [employer]   Office  [phone]

## 2018-03-20 NOTE — Care Management (Signed)
Patient has excision of distal 3/4th of fifth metatarsal right foot on 11.21..Dressing will be changed by podiatry tomorrow.  Cultures pending

## 2018-03-20 NOTE — Progress Notes (Signed)
Attempted 3 times today to do pt dressing changes, pt states that he will wait until later tonight for dressing changes, states that he is resting right now and doesn't want to be disturbed, will pass this on in report to night shift nurse

## 2018-03-20 NOTE — Consult Note (Signed)
NAME: Jonathon York  DOB: Aug 11, 1975  MRN: [id number]  Date/Time: 03/20/2018 10:59 AM  troxler Subjective:  REASON FOR CONSULT: rt foot infection ? Jonathon York is a 42 y.o. 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 Started on vanco and cefepime and flagyl.. Seen by podiatrist and taken for surgery and underwent Excision of distal 3/4th of fifth metatarsal right foot on 03/19/18 I am seeing the patient for antibiotic management. Pt says he is feeling better Pain abdomen is much improved No vomiting  Past Medical History:  Diagnosis Date  . Anxiety   . Decubitus ulcer   . Depression   . Neurogenic bladder   . Osteomyelitis ([place])   . Paraparesis of both lower limbs ([place]) 02/21/00    Past Surgical History:  Procedure Laterality Date  . AMPUTATION TOE Right 04/29/2015   Procedure: AMPUTATION TOE;  Surgeon: Sharlotte Alamo, MD;  Location: [facility] ORS;  Service: Podiatry;  Laterality: Right;  . IRRIGATION AND DEBRIDEMENT BUTTOCKS    . TOE AMPUTATION Right     B[place]in Lives on his own Former smoker occasional marijuana  Family History  Problem Relation Age of Onset  . Lymphoma Sister    Allergies  Allergen Reactions  . Orange Fruit [Citrus] Other (See Comments)    blisters  ? Current Facility-Administered Medications  Medication Dose Route Frequency Provider Last Rate Last Dose  . 0.9 %  sodium chloride infusion   Intravenous Continuous Dustin Flock, MD 75 mL/hr at 03/20/18 0953    . acetaminophen (TYLENOL) tablet 650 mg  650 mg Oral Q6H PRN Henreitta Leber, MD       Or  . acetaminophen (TYLENOL) suppository 650 mg  650 mg Rectal Q6H PRN Henreitta Leber, MD      . ceFEPIme (MAXIPIME) 2 g in sodium chloride 0.9 % 100 mL IVPB  2 g Intravenous Q12H Paticia Stack, RPH 200 mL/hr at 03/20/18 1051 2 g  at 03/20/18 1051  . enoxaparin (LOVENOX) injection 40 mg  40 mg Subcutaneous Q24H Henreitta Leber, MD   40 mg at 03/18/18 1408  . HYDROcodone-acetaminophen (NORCO/VICODIN) 5-325 MG per tablet 1 tablet  1 tablet Oral Q6H PRN Vaughan Basta, MD   1 tablet at 03/18/18 2256  . ketorolac (TORADOL) 30 MG/ML injection 30 mg  30 mg Intravenous Q6H PRN Henreitta Leber, MD   30 mg at 03/19/18 2142  . lactulose (CHRONULAC) 10 GM/15ML solution 30 g  30 g Oral BID PRN Henreitta Leber, MD      . metroNIDAZOLE (FLAGYL) IVPB 500 mg  500 mg Intravenous Q8H Carrie Mew, MD   Stopped at 03/20/18 0557  . ondansetron (ZOFRAN) injection 4 mg  4 mg Intravenous Q6H PRN Dustin Flock, MD       Or  . ondansetron (ZOFRAN) tablet 4 mg  4 mg Oral Q6H PRN Dustin Flock, MD      . oxybutynin (DITROPAN) tablet 15 mg  15 mg Oral BID Henreitta Leber, MD      . pantoprazole (PROTONIX) injection 40 mg  40 mg Intravenous Q12H Dustin Flock, MD   40 mg at 03/20/18 0951  . polyethylene glycol (MIRALAX / GLYCOLAX) packet 17 g  17 g Oral Daily PRN Henreitta Leber, MD      . promethazine (PHENERGAN) injection  12.5 mg  12.5 mg Intravenous Q6H PRN Pyreddy, Reatha Harps, MD   12.5 mg at 03/20/18 0600  . vancomycin (VANCOCIN) IVPB 750 mg/150 ml premix  750 mg Intravenous Q8H Shanlever, Pierce Crane, RPH         Abtx:  Anti-infectives (From admission, onward)   Start     Dose/Rate Route Frequency Ordered Stop   03/20/18 1730  vancomycin (VANCOCIN) IVPB 750 mg/150 ml premix     750 mg 150 mL/hr over 60 Minutes Intravenous Every 8 hours 03/20/18 0932     03/19/18 2200  vancomycin (VANCOCIN) IVPB 750 mg/150 ml premix  Status:  Discontinued     750 mg 150 mL/hr over 60 Minutes Intravenous Every 8 hours 03/19/18 2058 03/20/18 0932   03/19/18 1458  vancomycin (VANCOCIN) powder  Status:  Discontinued       As needed 03/19/18 1458 03/19/18 1509   03/18/18 2300  vancomycin (VANCOCIN) IVPB 750 mg/150 ml premix  Status:   Discontinued     750 mg 150 mL/hr over 60 Minutes Intravenous Every 12 hours 03/18/18 1018 03/19/18 2058   03/18/18 2200  ceFEPIme (MAXIPIME) 2 g in sodium chloride 0.9 % 100 mL IVPB     2 g 200 mL/hr over 30 Minutes Intravenous Every 12 hours 03/18/18 1018     03/18/18 0930  ceFEPIme (MAXIPIME) 2 g in sodium chloride 0.9 % 100 mL IVPB     2 g 200 mL/hr over 30 Minutes Intravenous  Once 03/18/18 0926 03/18/18 1020   03/18/18 0930  metroNIDAZOLE (FLAGYL) IVPB 500 mg     500 mg 100 mL/hr over 60 Minutes Intravenous Every 8 hours 03/18/18 0926     03/18/18 0930  vancomycin (VANCOCIN) IVPB 1000 mg/200 mL premix     1,000 mg 200 mL/hr over 60 Minutes Intravenous  Once 03/18/18 0926 03/18/18 1241      REVIEW OF SYSTEMS:  Const: negative fever, negative chills, fluctuating  weight  Eyes: negative diplopia or visual changes, negative eye pain ENT: negative coryza, negative sore throat Resp: negative cough, hemoptysis, dyspnea Cards: negative for chest pain, palpitations, lower extremity edema GU: has foley catheter GI:  abdominal pain, no diarrhea, bleeding, constipation Skin: negative for rash and pruritus Heme: negative for easy bruising and gum/nose bleeding MS:  muscle weakness Neurolo:paralysis legs Psych: negative for feelings of anxiety, depression  Endocrine: negative for thyroid, diabetes Allergy/Immunology- negative for any medication  allergies ?  Objective:  VITALS:  BP (!) 122/56 (BP Location: Right Arm)   Pulse 67   Temp 98.2 F (36.8 C) (Oral)   Resp 20   Ht _0  (1.753 m)   Wt 52.2 kg   SpO2 100%   BMI 16.98 kg/m  PHYSICAL EXAM:  General: Alert, cooperative, no distress, appears stated age. Pale and thin Head: Normocephalic, without obvious abnormality, atraumatic. Eyes: Conjunctivae clear, anicteric sclerae. Pupils are equal ENT Nares normal. No drainage or sinus tenderness. Lips, mucosa, and tongue normal. No Thrush Neck: Supple, symmetrical, no  adenopathy, thyroid: non tender no carotid bruit and no JVD. Back: sacral decubs have skin covering them- no slough or infected tissue    Lungs: Clear to auscultation bilaterally. No Wheezing or Rhonchi. No rales. Heart: Regular rate and rhythm, no murmur, rub or gallop. Abdomen: Soft, non-tender,not distended. Bowel sounds normal. No masses Extremities: wasting of legs Rt leg n  Surgical site clean  Skin: No rashes or lesions. Or bruising Lymph: Cervical, supraclavicular normal. Neurologic: Grossly non-focal Pertinent Labs Lab  Results CBC    Component Value Date/Time   WBC 7.7 03/20/2018 0525   RBC 4.64 03/20/2018 0525   HGB 8.2 (L) 03/20/2018 0525   HGB 8.6 (L) 12/25/2013 0542   HCT 30.3 (L) 03/20/2018 0525   HCT 28.3 (L) 12/25/2013 0542   PLT 474 (H) 03/20/2018 0525   PLT 488 (H) 12/30/2013 0518   MCV 65.3 (L) 03/20/2018 0525   MCV 73 (L) 12/25/2013 0542   MCH 17.7 (L) 03/20/2018 0525   MCHC 27.1 (L) 03/20/2018 0525   RDW 20.5 (H) 03/20/2018 0525   RDW 18.0 (H) 12/25/2013 0542   LYMPHSABS 1.4 12/25/2017 1657   LYMPHSABS 1.2 12/25/2013 0542   MONOABS 0.5 12/25/2017 1657   MONOABS 0.5 12/25/2013 0542   EOSABS 0.1 12/25/2017 1657   EOSABS 0.3 12/25/2013 0542   BASOSABS 0.1 12/25/2017 1657   BASOSABS 0.1 12/25/2013 0542    CMP Latest Ref Rng & Units 03/20/2018 03/19/2018 03/18/2018  Glucose 70 - 99 mg/dL 113(H) 87 146(H)  BUN 6 - 20 mg/dL _0 Creatinine 0.61 - 1.24 mg/dL 0.48(L) 0.38(L) 0.50(L)  Sodium 135 - 145 mmol/L 142 142 143  Potassium 3.5 - 5.1 mmol/L 3.3(L) 3.3(L) 3.4(L)  Chloride 98 - 111 mmol/L 110 111 106  CO2 22 - 32 mmol/L _1 Calcium 8.9 - 10.3 mg/dL 8.3(L) 8.0(L) 9.1  Total Protein 6.5 - 8.1 g/dL - - 8.3(H)  Total Bilirubin 0.3 - 1.2 mg/dL - - 0.4  Alkaline Phos 38 - 126 U/L - - 82  AST 15 - 41 U/L - - 17  ALT 0 - 44 U/L - - 13      Microbiology: Recent Results (from the past 240 hour(s))  Urine culture     Status: Abnormal     Collection Time: 03/18/18  8:10 AM  Result Value Ref Range Status   Specimen Description   Final    URINE, RANDOM Performed at [facility], [address] [place], [place] [address]    Special Requests   Final    NONE Performed at [facility], [address], [place], [place] [address]    Culture (A)  Final    >=100,000 COLONIES/mL ACINETOBACTER CALCOACETICUS/BAUMANNII COMPLEX   Report Status 03/20/2018 FINAL  Final   Organism ID, Bacteria ACINETOBACTER CALCOACETICUS/BAUMANNII COMPLEX (A)  Final      Susceptibility   Acinetobacter calcoaceticus/baumannii complex - MIC*    CEFTAZIDIME <=1 SENSITIVE Sensitive     CEFTRIAXONE <=1 SENSITIVE Sensitive     CIPROFLOXACIN <=0.25 SENSITIVE Sensitive     GENTAMICIN 4 SENSITIVE Sensitive     IMIPENEM 1 SENSITIVE Sensitive     PIP/TAZO <=4 SENSITIVE Sensitive     TRIMETH/SULFA <=20 SENSITIVE Sensitive     CEFEPIME <=1 SENSITIVE Sensitive     AMPICILLIN/SULBACTAM 16 INTERMEDIATE Intermediate     * >=100,000 COLONIES/mL ACINETOBACTER CALCOACETICUS/BAUMANNII COMPLEX  Blood culture (single)     Status: None (Preliminary result)   Collection Time: 03/18/18  8:10 AM  Result Value Ref Range Status   Specimen Description BLOOD LEFT WRIST  Final   Special Requests   Final    BOTTLES DRAWN AEROBIC AND ANAEROBIC Blood Culture results may not be optimal due to an excessive volume of blood received in culture bottles   Culture   Final    NO GROWTH 2 DAYS Performed at [facility], [address] [place], [place] [address]    Report Status PENDING  Incomplete  MRSA PCR Screening     Status: None   Collection Time: 03/18/18  2:38 PM  Result Value Ref Range Status   MRSA by PCR NEGATIVE NEGATIVE Final    Comment:        The GeneXpert MRSA Assay (FDA approved for NASAL specimens only), is one component of a comprehensive MRSA colonization surveillance program. It is not intended to diagnose MRSA infection nor  to guide or monitor treatment for MRSA infections. Performed at [facility], [address] [place], [place] [address]   Aerobic/Anaerobic Culture (surgical/deep wound)     Status: None (Preliminary result)   Collection Time: 03/19/18  2:43 PM  Result Value Ref Range Status   Specimen Description   Final    BONE Performed at [facility], [address] [place], [place] [address]    Special Requests   Final    RIGHT 5TH METATARSAL Performed at [facility], [address] [place], [place] [address]    Gram Stain   Final    NO WBC SEEN RARE GRAM NEGATIVE RODS Performed at [facility],  [address], [place],  [address]    Culture PENDING  Incomplete   Report Status PENDING  Incomplete   IMAGING RESULTS: ? Impression/Recommendation ?42 y.o. 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 ?Rt foot infection with osteo of the 5th met head- s/p excision. Currently on vanco and zosyn  Abdominal pain/nausea and vomiting- likely due to moderate stool burden in the colon- patient manually removes the stool every day  He has foley and colonized with acinetobacter - doubt it is a true infection  Sacral decubs have healed and has ingrowing skin.. No obvious infection  Paraplegia  Chronic anemia  _pt seen with Dr.Troxler   ________________________________________________ Discussed with patient,

## 2018-03-20 NOTE — Care Management Important Message (Signed)
Copy of signed IM left with patient in room.  

## 2018-03-20 NOTE — Progress Notes (Signed)
Initial Nutrition Assessment  DOCUMENTATION CODES:   Non-severe (moderate) malnutrition in context of social or environmental circumstances  INTERVENTION:   Ensure Enlive po TID, each supplement provides 350 kcal and 20 grams of protein  MVI daily   Ocuvite daily for wound healing (provides zinc, vitamin A, vitamin C, Vitamin E, copper, and selenium)  Vitamin C 574m po BID  Juven Fruit Punch BID, each serving provides 95kcal and 2.5g of protein (amino acids glutamine and arginine)  Recommend check iron/anemia labs including iron, TIBC, transferrin, B12, folate and ferritin   NUTRITION DIAGNOSIS:   Moderate Malnutrition related to social / environmental circumstances as evidenced by moderate to severe fat depletions, moderate to severe muscle depletions.  GOAL:   Patient will meet greater than or equal to 90% of their needs  MONITOR:   PO intake, Supplement acceptance, Labs, Weight trends, Skin, I & O's  REASON FOR ASSESSMENT:   Other (Comment)(low BMI)    ASSESSMENT:   42y.o. male, patient is a paraplegic secondary to work injury admitted with R foot osteomyelitis now s/p excision of distal 3/4th of fifth metatarsal 11/21  Met with pt in room today. Pt reports poor appetite and oral intake for at least 3 days pta but states "probably more". Pt had ordered and was planning on eating some oatmeal for breakfast this morning. Pt does not drink any supplements or vitamins at home but is willing to "drink whatever I need to" here. RD will order vitamins and supplements to encourage wound healing. Pt also reports that he avoids all citrus fruits as they burn his mouth. Recommend vitamin C supplementation until wounds are completely healed.  Per chart, pt appears weight stable pta. Pt denies any weight loss but also reports that he is wheelchair bound and has not weighed in years. Pt noted to have microcytic anemia and h/o iron deficiency; recommend check iron anemia labs.    Medications reviewed and include: lovenox, protonix, NaCl @75ml /hr, cefepime, metronidazole, vancomycin, zofran  Labs reviewed: K 3.3(L), creat 0.48(L) Hgb 8.2(L), Hct 30.3(L), MCV 65.3(L), MCH 65.3(L), MCH 17.7(L), MCHC 27.1(L)  NUTRITION - FOCUSED PHYSICAL EXAM:    Most Recent Value  Orbital Region  Mild depletion  Upper Arm Region  Moderate depletion  Thoracic and Lumbar Region  Moderate depletion  Buccal Region  Mild depletion  Temple Region  Mild depletion  Clavicle Bone Region  Severe depletion  Clavicle and Acromion Bone Region  Severe depletion  Scapular Bone Region  Moderate depletion  Dorsal Hand  Mild depletion  Patellar Region  Severe depletion  Anterior Thigh Region  Severe depletion  Posterior Calf Region  Severe depletion  Edema (RD Assessment)  None  Hair  Reviewed  Eyes  Reviewed  Mouth  Reviewed  Skin  Reviewed  Nails  Reviewed     Diet Order:   Diet Order            Diet regular Room service appropriate? Yes; Fluid consistency: Thin  Diet effective now             EDUCATION NEEDS:   Education needs have been addressed  Skin:  Skin Assessment: Reviewed RN Assessment(Sacrum 7.3 cm x 5.3 cm x 0.6 cm, Right ischium 7.3 cm x 7 cm x 2.3 cm, Left ischium 5 cm x 2.8 cm x 1.2 cm, Right lateral foot wound measures 1.4 cm x 1.4 cm x 0.7 cm. )  Last BM:  pta  Height:   Ht Readings from Last 1 Encounters:  03/18/18 5' 9"  (1.753 m)    Weight:   Wt Readings from Last 1 Encounters:  03/18/18 52.2 kg    Ideal Body Weight:  72.7 kg  BMI:  Body mass index is 16.98 kg/m.  Estimated Nutritional Needs:   Kcal:  1700-2000kcal/day   Protein:  78-89g/day   Fluid:  >1.6L/day   Koleen Distance MS, RD, LDN Pager #- [phone] Office#- [phone] After Hours Pager: [phone]

## 2018-03-20 NOTE — Progress Notes (Signed)
Pharmacy Antibiotic Note  Jonathon York is a 42 y.o. male admitted on 03/18/2018 with sepsis. Patient is paraplegic, has a chronic foley catheter, and a right foot wound. 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 Pharmacy has been consulted for Vancomycin, Cefepime, Flagyl dosing.  Plan: Patient received Cefepime 2 g IV x 1 and Vancomycin 1 g x 1 in the ED.   Spoke with physician about continuing Cefepime/Vancomycin/Flagyl instead of Zosyn/Vancomycin due to nephrotoxicity especially in the setting of paraplegia.  Vancomycin Kinetics Using adjusted CrCl for paraplegia 71 mL/min and actual BW of 52.2 kg ke 0.063 Vd 36.5 T1/2 11 hrs  11/22:  VT @ 17:00 = 11 mcg/mL Will increase to Vancomycin 1 gm IV Q8H to start on 11/23 @ 0200. Will draw next Vanc trough before 3rd new dose on 11/23 @ 17:30.  Current dosing Cefepime 2g q12 Vancomycin 100 mg q 8h  Height: 5\' 9"  (175.3 cm) Weight: 115 lb (52.2 kg) IBW/kg (Calculated) : 70.7  Temp (24hrs), Avg:98.3 F (36.8 C), Min:98.2 F (36.8 C), Max:98.5 F (36.9 C)  Recent Labs  Lab 03/18/18 0810 03/19/18 0720 03/19/18 2015 03/20/18 0525 03/20/18 1654  WBC 9.3 5.3  --  7.7  --   CREATININE 0.50* 0.38*  --  0.48*  --   VANCOTROUGH  --   --  14*  --  11*    Estimated Creatinine Clearance: 88.8 mL/min (A) (by C-G formula based on SCr of 0.48 mg/dL (L)).    Allergies  Allergen Reactions  . Orange Fruit [Citrus] Other (See Comments)    blisters    Antimicrobials this admission:  Cefepime 11/20 >>   Vancomycin 11/20 >>  Metronidazole 11/20 >>  Dose adjustments this admission: 11/21 Adj Vanc dose from 750 q 12h to 750 q 8h per trough level 14  11/22 - late dose - intended 0730 - given 0930 - adjusted next dose and trough times  Currently next dose due 1730, with trough @1700  (30 minutes prior) Will reassess dosing at that time - will be a slightly LOWER trough than a true  steady state, but should provide relevant information  Microbiology results:  BCx:  11/20 No growth x 2 days  UCx:  11/20 Final 11/22 Acinetobacter Cefepime sensitive  Mrsa pcr neg  Bone/Wound culture 11/22 >> pending   Thank you for allowing pharmacy to be a part of this patient's care.  Scherrie Gerlachobbins,Alaysiah Browder D, PharmD Clinical Pharmacist 03/20/2018 6:35 PM

## 2018-03-21 ENCOUNTER — Inpatient Hospital Stay: Payer: Medicare Other

## 2018-03-21 LAB — BASIC METABOLIC PANEL
Anion gap: 11 (ref 5–15)
BUN: 8 mg/dL (ref 6–20)
CALCIUM: 8 mg/dL — AB (ref 8.9–10.3)
CO2: 20 mmol/L — AB (ref 22–32)
CREATININE: 0.39 mg/dL — AB (ref 0.61–1.24)
Chloride: 110 mmol/L (ref 98–111)
GFR calc Af Amer: >60 mL/min (ref >60)
GLUCOSE: 82 mg/dL (ref 70–99)
Potassium: 3.1 mmol/L — ABNORMAL LOW (ref 3.5–5.1)
Sodium: 141 mmol/L (ref 135–145)

## 2018-03-21 LAB — CBC
HEMATOCRIT: 28.6 % — AB (ref 39.0–52.0)
Hemoglobin: 7.7 g/dL — ABNORMAL LOW (ref 13.0–17.0)
MCH: 17.6 pg — AB (ref 26.0–34.0)
MCHC: 26.9 g/dL — AB (ref 30.0–36.0)
MCV: 65.4 fL — AB (ref 80.0–100.0)
PLATELETS: 370 10*3/uL (ref 150–400)
RBC: 4.37 MIL/uL (ref 4.22–5.81)
RDW: 20.6 % — AB (ref 11.5–15.5)
WBC: 5.6 10*3/uL (ref 4.0–10.5)
nRBC: 0 % (ref 0.0–0.2)

## 2018-03-21 LAB — VANCOMYCIN, TROUGH: Vancomycin Tr: 19 ug/mL (ref 15–20)

## 2018-03-21 MED ORDER — POLYETHYLENE GLYCOL 3350 17 G PO PACK
17.0000 g | PACK | Freq: Every day | ORAL | Status: DC
  Administered 2018-03-21 – 2018-03-22 (×2): 17 g via ORAL
  Filled 2018-03-21 (×2): qty 1

## 2018-03-21 MED ORDER — SENNOSIDES-DOCUSATE SODIUM 8.6-50 MG PO TABS
2.0000 | ORAL_TABLET | Freq: Every day | ORAL | Status: DC
  Administered 2018-03-22: 2 via ORAL
  Filled 2018-03-21 (×2): qty 2

## 2018-03-21 MED ORDER — POTASSIUM CHLORIDE 10 MEQ/100ML IV SOLN
10.0000 meq | INTRAVENOUS | Status: AC
  Administered 2018-03-21 (×2): 10 meq via INTRAVENOUS
  Filled 2018-03-21 (×2): qty 100

## 2018-03-21 NOTE — Progress Notes (Signed)
Family Meeting Note  Advance Directive:yes  Today a meeting took place with the Patient.  Patient is able to participate   The following clinical team members were present during this meeting:MD  The following were discussed:Patient's diagnosis: Chronic bedbound state, paraplegic, osteomyelitis, Patient's progosis: Unable to determine and Goals for treatment: Full Code  Additional follow-up to be provided: prn  Time spent during discussion:20 minutes  Bertrum SolMontell D Ege Muckey, MD

## 2018-03-21 NOTE — Progress Notes (Signed)
Patient refused dressing changes to scarum

## 2018-03-21 NOTE — Progress Notes (Signed)
Pharmacy Antibiotic Note  Jonathon LeedsRichard L York is a 42 y.o. male admitted on 03/18/2018 with sepsis. Patient is paraplegic, has a chronic foley catheter, and a right foot wound. 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 Pharmacy has been consulted for Vancomycin, Cefepime, Flagyl dosing.  Plan: Patient received Cefepime 2 g IV x 1 and Vancomycin 1 g x 1 in the ED.   Spoke with physician about continuing Cefepime/Vancomycin/Flagyl instead of Zosyn/Vancomycin due to nephrotoxicity especially in the setting of paraplegia.  Vancomycin Kinetics Using adjusted CrCl for paraplegia 71 mL/min and actual BW of 52.2 kg ke 0.063 Vd 36.5 T1/2 11 hrs  11/22:  VT @ 17:00 = 11 mcg/mL Will increase to Vancomycin 1 gm IV Q8H to start on 11/23 @ 0200. Will draw next Vanc trough before 3rd new dose on 11/23 @ 17:30.  Current dosing Cefepime 2g q12 Vancomycin 100 mg q 8h  Height: 5\' 9"  (175.3 cm) Weight: 115 lb (52.2 kg) IBW/kg (Calculated) : 70.7  Temp (24hrs), Avg:98.2 F (36.8 C), Min:98.1 F (36.7 C), Max:98.4 F (36.9 C)  Recent Labs  Lab 03/18/18 0810 03/19/18 0720  03/20/18 0525 03/20/18 1654 03/21/18 0734 03/21/18 1730  WBC 9.3 5.3  --  7.7  --  5.6  --   CREATININE 0.50* 0.38*  --  0.48*  --  0.39*  --   VANCOTROUGH  --   --    < >  --  11*  --  19   < > = values in this interval not displayed.    Estimated Creatinine Clearance: 88.8 mL/min (A) (by C-G formula based on SCr of 0.39 mg/dL (L)).    Allergies  Allergen Reactions  . Orange Fruit [Citrus] Other (See Comments)    blisters    Antimicrobials this admission:  Cefepime 11/20 >>   Vancomycin 11/20 >>  Metronidazole 11/20 >>  Dose adjustments this admission: 11/21 Adj Vanc dose from 750 q 12h to 750 q 8h per trough level 14  11/22 - late dose - intended 0730 - given 0930 - adjusted next dose and trough times  11/23 - Trough 19, will continue current  dosing.  Currently next dose due 1730, with trough @1700  (30 minutes prior) Will reassess dosing at that time - will be a slightly LOWER trough than a true steady state, but should provide relevant information  Microbiology results:  BCx:  11/20 No growth x 2 days  UCx:  11/20 Final 11/22 Acinetobacter Cefepime sensitive  Mrsa pcr neg  Bone/Wound culture 11/22 >> pending   Thank you for allowing pharmacy to be a part of this patient's care.  Orinda Kennerhris A Cayle Thunder, PharmD Clinical Pharmacist 03/21/2018 6:29 PM

## 2018-03-21 NOTE — Progress Notes (Signed)
Pt refused dressing change because he reported that someone did some of the changes already so he think he will be fine.

## 2018-03-21 NOTE — Progress Notes (Signed)
[employer] - [place] [facility]                                                                                                                                                                                  Patient Demographics   Jonathon York, is a 42 y.o. male, DOB - [birth year], ZOX:[id number]  Admit date - 03/18/2018   Admitting Physician Houston Siren, MD  Outpatient Primary MD for the patient is [place], [facility]   LOS - 3  Subjective: Complains of nausea and vomiting, KUB consistent with constipation, podiatry input appreciated    Review of Systems:   CONSTITUTIONAL: No documented fever. No fatigue, weakness. No weight gain, no weight loss.  EYES: No blurry or double vision.  ENT: No tinnitus. No postnasal drip. No redness of the oropharynx.  RESPIRATORY: No cough, no wheeze, no hemoptysis. No dyspnea.  CARDIOVASCULAR: No chest pain. No orthopnea. No palpitations. No syncope.  GASTROINTESTINAL: No nausea, no vomiting or diarrhea. No abdominal pain. No melena or hematochezia.  GENITOURINARY: No dysuria or hematuria.  ENDOCRINE: No polyuria or nocturia. No heat or cold intolerance.  HEMATOLOGY: No anemia. No bruising. No bleeding.  INTEGUMENTARY: No rashes. No lesions.  MUSCULOSKELETAL: Right foot osteomyelitis.  NEUROLOGIC: No numbness, tingling, or ataxia. No seizure-type activity.  PSYCHIATRIC: No anxiety. No insomnia. No ADD.    Vitals:   Vitals:   03/20/18 0534 03/20/18 1319 03/20/18 1936 03/21/18 0405  BP: (!) 122/56 (!) 93/55 (!) 101/55 (!) 91/53  Pulse: 67 67 65 65  Resp: 20 20 13 13   Temp: 98.2 F (36.8 C) 98.2 F (36.8 C) 98.4 F (36.9 C) 98.1 F (36.7 C)  TempSrc: Oral Oral Oral Oral  SpO2: 100% 98% 98% 98%  Weight:      Height:        Wt Readings from Last 3 Encounters:  03/18/18 52.2 kg  12/25/17 45.4 kg  02/14/17 43.1 kg     Intake/Output Summary (Last 24 hours) at 03/21/2018 1135 Last data filed at  03/21/2018 8119 Gross per 24 hour  Intake 676.29 ml  Output 1500 ml  Net -823.71 ml    Physical Exam:   GENERAL: Pleasant-appearing in no apparent distress.  HEAD, EYES, EARS, NOSE AND THROAT: Atraumatic, normocephalic. Extraocular muscles are intact. Pupils equal and reactive to light. Sclerae anicteric. No conjunctival injection. No oro-pharyngeal erythema.  NECK: Supple. There is no jugular venous distention. No bruits, no lymphadenopathy, no thyromegaly.  HEART: Regular rate and rhythm,. No murmurs, no rubs, no clicks.  LUNGS: Clear to auscultation bilaterally. No rales or rhonchi. No wheezes.  ABDOMEN: Soft, flat, nontender, nondistended. Has good bowel sounds.  No hepatosplenomegaly appreciated.  EXTREMITIES: No evidence of any cyanosis, clubbing, or peripheral edema.  +2 pedal and radial pulses bilaterally.  Right foot dressing in place NEUROLOGIC: The patient is alert, awake, and oriented x3 with no focal motor or sensory deficits appreciated bilaterally.  SKIN: Moist and warm with no rashes appreciated.  Psych: Not anxious, depressed LN: No inguinal LN enlargement    Antibiotics   Anti-infectives (From admission, onward)   Start     Dose/Rate Route Frequency Ordered Stop   03/21/18 0200  vancomycin (VANCOCIN) IVPB 1000 mg/200 mL premix     1,000 mg 200 mL/hr over 60 Minutes Intravenous Every 8 hours 03/20/18 1833     03/20/18 1730  vancomycin (VANCOCIN) IVPB 750 mg/150 ml premix  Status:  Discontinued     750 mg 150 mL/hr over 60 Minutes Intravenous Every 8 hours 03/20/18 0932 03/20/18 1832   03/20/18 1200  metroNIDAZOLE (FLAGYL) tablet 500 mg     500 mg Oral Every 8 hours 03/20/18 1156     03/19/18 2200  vancomycin (VANCOCIN) IVPB 750 mg/150 ml premix  Status:  Discontinued     750 mg 150 mL/hr over 60 Minutes Intravenous Every 8 hours 03/19/18 2058 03/20/18 0932   03/19/18 1458  vancomycin (VANCOCIN) powder  Status:  Discontinued       As needed 03/19/18 1458 03/19/18  1509   03/18/18 2300  vancomycin (VANCOCIN) IVPB 750 mg/150 ml premix  Status:  Discontinued     750 mg 150 mL/hr over 60 Minutes Intravenous Every 12 hours 03/18/18 1018 03/19/18 2058   03/18/18 2200  ceFEPIme (MAXIPIME) 2 g in sodium chloride 0.9 % 100 mL IVPB     2 g 200 mL/hr over 30 Minutes Intravenous Every 12 hours 03/18/18 1018     03/18/18 0930  ceFEPIme (MAXIPIME) 2 g in sodium chloride 0.9 % 100 mL IVPB     2 g 200 mL/hr over 30 Minutes Intravenous  Once 03/18/18 0926 03/18/18 1020   03/18/18 0930  metroNIDAZOLE (FLAGYL) IVPB 500 mg  Status:  Discontinued     500 mg 100 mL/hr over 60 Minutes Intravenous Every 8 hours 03/18/18 0926 03/20/18 1156   03/18/18 0930  vancomycin (VANCOCIN) IVPB 1000 mg/200 mL premix     1,000 mg 200 mL/hr over 60 Minutes Intravenous  Once 03/18/18 0926 03/18/18 1241      Medications   Scheduled Meds: . enoxaparin (LOVENOX) injection  40 mg Subcutaneous Q24H  . feeding supplement (ENSURE ENLIVE)  237 mL Oral TID BM  . metroNIDAZOLE  500 mg Oral Q8H  . multivitamin with minerals  1 tablet Oral Daily  . multivitamin-lutein  1 capsule Oral Daily  . nutrition supplement (JUVEN)  1 packet Oral BID BM  . oxybutynin  15 mg Oral BID  . pantoprazole (PROTONIX) IV  40 mg Intravenous Q12H  . polyethylene glycol  17 g Oral Daily  . senna-docusate  2 tablet Oral QHS  . vitamin C  500 mg Oral BID   Continuous Infusions: . sodium chloride 75 mL/hr at 03/21/18 0400  . ceFEPime (MAXIPIME) IV 2 g (03/21/18 1025)  . vancomycin 1,000 mg (03/21/18 0826)   PRN Meds:.acetaminophen **OR** acetaminophen, HYDROcodone-acetaminophen, ketorolac, lactulose, ondansetron (ZOFRAN) IV **OR** ondansetron, promethazine   Data Review:   Micro Results Recent Results (from the past 240 hour(s))  Urine culture     Status: Abnormal   Collection Time: 03/18/18  8:10 AM  Result Value Ref Range Status  Specimen Description   Final    URINE, RANDOM Performed at [facility], [address] [place], [place] [address]    Special Requests   Final    NONE Performed at [facility], [address] [place], [place] [address]    Culture (A)  Final    >=100,000 COLONIES/mL ACINETOBACTER CALCOACETICUS/BAUMANNII COMPLEX   Report Status 03/20/2018 FINAL  Final   Organism ID, Bacteria ACINETOBACTER CALCOACETICUS/BAUMANNII COMPLEX (A)  Final      Susceptibility   Acinetobacter calcoaceticus/baumannii complex - MIC*    CEFTAZIDIME <=1 SENSITIVE Sensitive     CEFTRIAXONE <=1 SENSITIVE Sensitive     CIPROFLOXACIN <=0.25 SENSITIVE Sensitive     GENTAMICIN 4 SENSITIVE Sensitive     IMIPENEM 1 SENSITIVE Sensitive     PIP/TAZO <=4 SENSITIVE Sensitive     TRIMETH/SULFA <=20 SENSITIVE Sensitive     CEFEPIME <=1 SENSITIVE Sensitive     AMPICILLIN/SULBACTAM 16 INTERMEDIATE Intermediate     * >=100,000 COLONIES/mL ACINETOBACTER CALCOACETICUS/BAUMANNII COMPLEX  Blood culture (single)     Status: None (Preliminary result)   Collection Time: 03/18/18  8:10 AM  Result Value Ref Range Status   Specimen Description BLOOD LEFT WRIST  Final   Special Requests   Final    BOTTLES DRAWN AEROBIC AND ANAEROBIC Blood Culture results may not be optimal due to an excessive volume of blood received in culture bottles   Culture   Final    NO GROWTH 3 DAYS Performed at [facility], [address], [place], [place] [address]    Report Status PENDING  Incomplete  MRSA PCR Screening     Status: None   Collection Time: 03/18/18  2:38 PM  Result Value Ref Range Status   MRSA by PCR NEGATIVE NEGATIVE Final    Comment:        The GeneXpert MRSA Assay (FDA approved for NASAL specimens only), is one component of a comprehensive MRSA colonization surveillance program. It is not intended to diagnose MRSA infection nor to guide or monitor treatment for MRSA infections. Performed at [facility], [address] [place], [place] [address]    Aerobic/Anaerobic Culture (surgical/deep wound)     Status: None (Preliminary result)   Collection Time: 03/19/18  2:43 PM  Result Value Ref Range Status   Specimen Description   Final    BONE Performed at [facility], [address] [place], [place] [address]    Special Requests   Final    RIGHT 5TH METATARSAL Performed at [facility], [address] [place], [place] [address]    Gram Stain NO WBC SEEN RARE GRAM NEGATIVE RODS   Final   Culture   Final    CULTURE REINCUBATED FOR BETTER GROWTH Performed at [facility], [address], [place], [place] [address]    Report Status PENDING  Incomplete    Radiology Reports Dg Abd 1 View  Result Date: 03/21/2018 CLINICAL DATA:  Nausea. EXAM: ABDOMEN - 1 VIEW COMPARISON:  CT abdomen pelvis 03/18/2018 FINDINGS: Lung bases are clear. Spinal fusion hardware. Large amount of stool throughout the colon. Presumed catheter over the pelvis. Nonobstructed bowel gas pattern. Supine evaluation limited for free intraperitoneal air. Re demonstrated chronic changes involving the pelvic osseous structures. IMPRESSION: Nonobstructed bowel gas pattern. Stool throughout the colon as can be seen with constipation. Electronically Signed   By: Annia Belt M.D.   On: 03/21/2018 10:56   Ct Abdomen Pelvis W Contrast  Result Date: 03/18/2018 CLINICAL DATA:  Vomiting, abdominal pain. EXAM: CT ABDOMEN AND PELVIS WITH CONTRAST TECHNIQUE: Multidetector CT imaging of the abdomen and pelvis was performed using the standard protocol following bolus administration of intravenous contrast. CONTRAST:  OMNIPAQUE IOHEXOL 300 MG/ML  SOLN COMPARISON:  02/15/2017 FINDINGS: Lower chest: Lung bases are clear. No effusions. Heart is normal size. Hepatobiliary: No focal hepatic abnormality. Gallbladder unremarkable. Tiny cyst centrally in the right hepatic lobe. Pancreas: No focal abnormality or ductal dilatation. Spleen: No focal abnormality.   Normal size. Adrenals/Urinary Tract: Small left renal cysts. No hydronephrosis bilaterally. Adrenal glands unremarkable. Urinary bladder decompressed with Foley catheter in place. Stomach/Bowel: Moderate stool burden throughout the colon. Appendix is normal. Stomach and small bowel grossly unremarkable. No evidence of bowel obstruction. Vascular/Lymphatic: Aortic atherosclerosis. No enlarged abdominal or pelvic lymph nodes. Reproductive: No visible focal abnormality. Other: No free fluid or free air. Musculoskeletal: Extensive decubitus ulcers noted over the sacrum and ischium bilaterally, most pronounced over the right ischium. Sclerosis noted within the right ischium and sacrum which may reflect chronic osteomyelitis. IMPRESSION: Moderate stool burden throughout the colon. Chronic decubitus ulcers over the ischium bilaterally and sacrum with sclerosis throughout the right ischium and sacrum, likely chronic osteomyelitis. Chronic indwelling Foley catheter noted with bladder decompressed. Scattered aortic atherosclerosis. No acute findings in the abdomen or pelvis. Electronically Signed   By: Charlett Nose M.D.   On: 03/18/2018 09:47   Mr Foot Right Wo Contrast  Result Date: 03/18/2018 CLINICAL DATA:  42 year old male diabetic with foot swelling. Suspect osteomyelitis. EXAM: MRI OF THE RIGHT FOREFOOT WITHOUT CONTRAST TECHNIQUE: Multiplanar, multisequence MR imaging of the right foot was performed. No intravenous contrast was administered. COMPARISON:  Radiographs of the right foot from 12/25/2017 and 03/18/2018 FINDINGS: Bones/Joint/Cartilage Cortical bone destruction along the lateral aspect of the fifth metatarsal head with periosteal new bone formation of the neck and underlying marrow edema involving the distal third of the fifth metatarsal are in keeping with acute osteomyelitis. Soft tissue ulceration and attenuation is suggested adjacent to fifth metatarsal. First through fourth metatarsals are of normal  signal intensity morphology. The first through third toes are intact. 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 Ligaments Noncontributory Muscles and Tendons No evidence of pyomyositis. Mild neurogenic changes of the plantar forefoot musculature. Soft tissues Soft tissue attenuation and ulceration adjacent to the fifth metatarsal head and shaft. IMPRESSION: Acute osteomyelitis with bone destruction and periosteal new bone formation involving the fifth metatarsal head and neck. Marrow signal abnormality involves the distal third of the fifth metatarsal with adjacent overlying soft tissue ulceration and edema. No drainable fluid collection or evidence of pyomyositis. Electronically Signed   By: Tollie Eth M.D.   On: 03/18/2018 22:26   Dg Chest Portable 1 View  Result Date: 03/18/2018 CLINICAL DATA:  Pain and vomiting.  Renal failure EXAM: PORTABLE CHEST 1 VIEW COMPARISON:  February 14, 2017 FINDINGS: No edema or consolidation. Heart is upper normal in size with pulmonary vascularity normal. No adenopathy. Postoperative changes noted in the thoracic and upper lumbar regions. IMPRESSION: No edema or consolidation.  Heart upper normal in size. Electronically Signed   By: Bretta Bang III M.D.   On: 03/18/2018 08:48   Dg Foot Complete Right  Result Date: 03/18/2018 CLINICAL DATA:  Wound fifth metatarsal region EXAM: RIGHT FOOT COMPLETE - 3+ VIEW COMPARISON:  December 25, 2017 FINDINGS: Frontal, oblique, and lateral views were obtained. Patient has  had previous amputation at the fourth and fifth MTP joint levels. There is soft tissue ulceration along the lateral distal foot region. There is loss cortex along a portion of the lateral aspect of the distal most aspect of the fifth metatarsal, concerning for osteomyelitis. No other bony destruction evident. There is diffuse osteoporosis. There are flexion deformities of the first IP as well as the second and third PIP  and DIP joints. There is moderate narrowing of the first MTP joint. No erosive changes. There is moderate soft tissue swelling. IMPRESSION: 1. Soft tissue ulceration along the lateral distal aspect of the foot with underlying loss of cortex along the lateral aspect of the distal most aspect of the fifth metatarsal, felt to represent localized osteomyelitis. No other bony destruction evident. 2. Status post previous amputations at the fourth and fifth MTP joint levels. 3.  Diffuse osteoporosis. 4. Narrowing first MTP joint. Flexion of the first IP as well as the second and third PIP and DIP joints. 5.  Moderate soft tissue swelling. Electronically Signed   By: Bretta BangWilliam  Woodruff III M.D.   On: 03/18/2018 08:47     CBC Recent Labs  Lab 03/18/18 0810 03/19/18 0720 03/20/18 0525 03/21/18 0734  WBC 9.3 5.3 7.7 5.6  HGB 8.7* 7.2* 8.2* 7.7*  HCT 31.6* 25.9* 30.3* 28.6*  PLT 522* 367 474* 370  MCV 64.4* 64.8* 65.3* 65.4*  MCH 17.7* 18.0* 17.7* 17.6*  MCHC 27.5* 27.8* 27.1* 26.9*  RDW 20.5* 20.4* 20.5* 20.6*    Chemistries  Recent Labs  Lab 03/18/18 0810 03/19/18 0720 03/20/18 0525 03/21/18 0734  NA 143 142 142 141  K 3.4* 3.3* 3.3* 3.1*  CL 106 111 110 110  CO2 27 23 22  20*  GLUCOSE 146* 87 113* 82  BUN 14 16 12 8   CREATININE 0.50* 0.38* 0.48* 0.39*  CALCIUM 9.1 8.0* 8.3* 8.0*  AST 17  --   --   --   ALT 13  --   --   --   ALKPHOS 82  --   --   --   BILITOT 0.4  --   --   --    ------------------------------------------------------------------------------------------------------------------ estimated creatinine clearance is 88.8 mL/min (A) (by C-G formula based on SCr of 0.39 mg/dL (L)). ------------------------------------------------------------------------------------------------------------------ No results for input(s): HGBA1C in the last 72 hours. ------------------------------------------------------------------------------------------------------------------ No results for  input(s): CHOL, HDL, LDLCALC, TRIG, CHOLHDL, LDLDIRECT in the last 72 hours. ------------------------------------------------------------------------------------------------------------------ No results for input(s): TSH, T4TOTAL, T3FREE, THYROIDAB in the last 72 hours.  Invalid input(s): FREET3 ------------------------------------------------------------------------------------------------------------------ No results for input(s): VITAMINB12, FOLATE, FERRITIN, TIBC, IRON, RETICCTPCT in the last 72 hours.  Coagulation profile No results for input(s): INR, PROTIME in the last 168 hours.  No results for input(s): DDIMER in the last 72 hours.  Cardiac Enzymes No results for input(s): CKMB, TROPONINI, MYOGLOBIN in the last 168 hours.  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  *Nausea vomiting/abdominal pain Secondary to constipation Continue antiemetic, Senokot at bedtime, MiraLAX daily, fleets enema x1  *Right foot osteomyelitis Resolving Continue IV antibiotic Status post Excision of distal 3/4th of fifth metatarsal right foot Preliminary intraoperative cultures are showing  gram-negative rods ID has seen the patient they state that he likely will need not need IV antibiotic Podiatry input appreciated   *Hypokalemia Replete PRN  *Anemia secondary to chronic disease Stable  *Urinary incontinence Stable continue oxybutynin.  *Bedbound state Stable Increase nursing care  PRN  *Acute constipation Plan of care per above    Code Status Orders  (From admission, onward)         Start     Ordered   03/18/18 1120  Full code  Continuous     03/18/18 1119        Code Status History    Date Active Date Inactive Code Status Order ID Comments User Context   02/14/2017 2321 02/19/2017 2046 Full Code [id number]  Oralia Manis, MD Inpatient   11/19/2016 1720 11/21/2016 2207 Full Code [id number]  Auburn Bilberry, MD Inpatient   04/15/2016 1802 04/19/2016 0211 Full Code [id number]  Shaune Pollack, MD Inpatient   04/29/2015 1017 04/30/2015 1339 Full Code [id number]  Linus Galas, MD Inpatient   04/28/2015 0438 04/29/2015 1017 Full Code [id number]  Milagros Loll, MD ED           Consults podiatry  DVT Prophylaxis  Lovenox   Lab Results  Component Value Date   PLT 370 03/21/2018     Time Spent in minutes 35 minutes greater than 50% of time spent in care coordination and counseling patient regarding the condition and plan of care.   Evelena Asa Axell Trigueros M.D on 03/21/2018 at 11:35 AM  Between 7am to 6pm - Pager - [phone]  After 6pm go to [email or web address] - Social research officer, government  [employer]   Office  [phone]

## 2018-03-21 NOTE — Progress Notes (Signed)
ID Waiting for bone culture to finalize to decide on antibiotic for discharge

## 2018-03-21 NOTE — Progress Notes (Signed)
Pt is reting well with some nausea still, but better than yesterday.  Still awaiting bone CandS and pathology.  Leave foot dressing intact today.

## 2018-03-22 LAB — POTASSIUM: POTASSIUM: 3.2 mmol/L — AB (ref 3.5–5.1)

## 2018-03-22 MED ORDER — FAMOTIDINE 20 MG PO TABS
20.0000 mg | ORAL_TABLET | Freq: Every day | ORAL | Status: DC
  Administered 2018-03-23: 11:00:00 20 mg via ORAL
  Filled 2018-03-22: qty 1

## 2018-03-22 MED ORDER — LACTULOSE 10 GM/15ML PO SOLN
30.0000 g | Freq: Two times a day (BID) | ORAL | Status: DC
  Administered 2018-03-22: 30 g via ORAL
  Filled 2018-03-22: qty 60

## 2018-03-22 MED ORDER — POTASSIUM CHLORIDE 20 MEQ PO PACK
40.0000 meq | PACK | Freq: Once | ORAL | Status: AC
  Administered 2018-03-22: 40 meq via ORAL

## 2018-03-22 MED ORDER — METOCLOPRAMIDE HCL 5 MG/ML IJ SOLN
10.0000 mg | Freq: Three times a day (TID) | INTRAMUSCULAR | Status: DC
  Administered 2018-03-22 – 2018-03-23 (×3): 10 mg via INTRAVENOUS
  Filled 2018-03-22 (×3): qty 2

## 2018-03-22 NOTE — Progress Notes (Signed)
Pt has order for low air loss mattress because of several stage 2 and 3 pressure injuries.  Offered bed exchange to pt and he refused stating that he was leaving tomorrow and did not want the air loss mattresses.  Henriette CombsSarah Soni Kegel RN

## 2018-03-22 NOTE — Progress Notes (Signed)
[employer] - [place] at [facility]                                                                                                                                                                                  Patient Demographics   Jonathon York, is a 42 y.o. male, DOB - January 05, 1976, ZOX:[id number]  Admit date - 03/18/2018   Admitting Physician Houston Siren, MD  Outpatient Primary MD for the patient is [place], [facility]   LOS - 4  Subjective: Continues to complain of nausea and vomiting, KUB noted for constipation, patient not interested in enemas, start lactulose twice daily, replete potassium, infectious disease and podiatry input greatly appreciated  Review of Systems:   CONSTITUTIONAL: No documented fever. No fatigue, weakness. No weight gain, no weight loss.  EYES: No blurry or double vision.  ENT: No tinnitus. No postnasal drip. No redness of the oropharynx.  RESPIRATORY: No cough, no wheeze, no hemoptysis. No dyspnea.  CARDIOVASCULAR: No chest pain. No orthopnea. No palpitations. No syncope.  GASTROINTESTINAL: No nausea, no vomiting or diarrhea. No abdominal pain. No melena or hematochezia.  GENITOURINARY: No dysuria or hematuria.  ENDOCRINE: No polyuria or nocturia. No heat or cold intolerance.  HEMATOLOGY: No anemia. No bruising. No bleeding.  INTEGUMENTARY: No rashes. No lesions.  MUSCULOSKELETAL: Right foot osteomyelitis.  NEUROLOGIC: No numbness, tingling, or ataxia. No seizure-type activity.  PSYCHIATRIC: No anxiety. No insomnia. No ADD.    Vitals:   Vitals:   03/21/18 0405 03/21/18 1312 03/21/18 1957 03/22/18 0408  BP: (!) 91/53 104/61 100/60 (!) 93/55  Pulse: 65 83 80 75  Resp: 13 16 18 17   Temp: 98.1 F (36.7 C) 98.2 F (36.8 C) 98.6 F (37 C) 98.2 F (36.8 C)  TempSrc: Oral Oral Oral Oral  SpO2: 98% 98% 100% 97%  Weight:      Height:        Wt Readings from Last 3 Encounters:  03/18/18 52.2 kg  12/25/17 45.4 kg   02/14/17 43.1 kg     Intake/Output Summary (Last 24 hours) at 03/22/2018 0941 Last data filed at 03/22/2018 0502 Gross per 24 hour  Intake 2377.98 ml  Output -  Net 2377.98 ml    Physical Exam:   GENERAL: Pleasant-appearing in no apparent distress.  HEAD, EYES, EARS, NOSE AND THROAT: Atraumatic, normocephalic. Extraocular muscles are intact. Pupils equal and reactive to light. Sclerae anicteric. No conjunctival injection. No oro-pharyngeal erythema.  NECK: Supple. There is no jugular venous distention. No bruits, no lymphadenopathy, no thyromegaly.  HEART: Regular rate and rhythm,. No murmurs, no rubs, no clicks.  LUNGS: Clear to auscultation bilaterally. No rales or rhonchi.  No wheezes.  ABDOMEN: Soft, flat, nontender, nondistended. Has good bowel sounds. No hepatosplenomegaly appreciated.  EXTREMITIES: No evidence of any cyanosis, clubbing, or peripheral edema.  +2 pedal and radial pulses bilaterally.  Right foot dressing in place NEUROLOGIC: The patient is alert, awake, and oriented x3 with no focal motor or sensory deficits appreciated bilaterally.  SKIN: Moist and warm with no rashes appreciated.  Psych: Not anxious, depressed LN: No inguinal LN enlargement    Antibiotics   Anti-infectives (From admission, onward)   Start     Dose/Rate Route Frequency Ordered Stop   03/21/18 0200  vancomycin (VANCOCIN) IVPB 1000 mg/200 mL premix     1,000 mg 200 mL/hr over 60 Minutes Intravenous Every 8 hours 03/20/18 1833     03/20/18 1730  vancomycin (VANCOCIN) IVPB 750 mg/150 ml premix  Status:  Discontinued     750 mg 150 mL/hr over 60 Minutes Intravenous Every 8 hours 03/20/18 0932 03/20/18 1832   03/20/18 1200  metroNIDAZOLE (FLAGYL) tablet 500 mg     500 mg Oral Every 8 hours 03/20/18 1156     03/19/18 2200  vancomycin (VANCOCIN) IVPB 750 mg/150 ml premix  Status:  Discontinued     750 mg 150 mL/hr over 60 Minutes Intravenous Every 8 hours 03/19/18 2058 03/20/18 0932   03/19/18  1458  vancomycin (VANCOCIN) powder  Status:  Discontinued       As needed 03/19/18 1458 03/19/18 1509   03/18/18 2300  vancomycin (VANCOCIN) IVPB 750 mg/150 ml premix  Status:  Discontinued     750 mg 150 mL/hr over 60 Minutes Intravenous Every 12 hours 03/18/18 1018 03/19/18 2058   03/18/18 2200  ceFEPIme (MAXIPIME) 2 g in sodium chloride 0.9 % 100 mL IVPB     2 g 200 mL/hr over 30 Minutes Intravenous Every 12 hours 03/18/18 1018     03/18/18 0930  ceFEPIme (MAXIPIME) 2 g in sodium chloride 0.9 % 100 mL IVPB     2 g 200 mL/hr over 30 Minutes Intravenous  Once 03/18/18 0926 03/18/18 1020   03/18/18 0930  metroNIDAZOLE (FLAGYL) IVPB 500 mg  Status:  Discontinued     500 mg 100 mL/hr over 60 Minutes Intravenous Every 8 hours 03/18/18 0926 03/20/18 1156   03/18/18 0930  vancomycin (VANCOCIN) IVPB 1000 mg/200 mL premix     1,000 mg 200 mL/hr over 60 Minutes Intravenous  Once 03/18/18 0926 03/18/18 1241      Medications   Scheduled Meds: . enoxaparin (LOVENOX) injection  40 mg Subcutaneous Q24H  . famotidine  20 mg Oral Daily  . feeding supplement (ENSURE ENLIVE)  237 mL Oral TID BM  . lactulose  30 g Oral BID  . metoCLOPramide (REGLAN) injection  10 mg Intravenous Q8H  . metroNIDAZOLE  500 mg Oral Q8H  . multivitamin with minerals  1 tablet Oral Daily  . multivitamin-lutein  1 capsule Oral Daily  . nutrition supplement (JUVEN)  1 packet Oral BID BM  . oxybutynin  15 mg Oral BID  . polyethylene glycol  17 g Oral Daily  . potassium chloride  40 mEq Oral Once  . senna-docusate  2 tablet Oral QHS  . vitamin C  500 mg Oral BID   Continuous Infusions: . ceFEPime (MAXIPIME) IV 200 mL/hr at 03/21/18 2213  . vancomycin 1,000 mg (03/22/18 0303)   PRN Meds:.acetaminophen **OR** acetaminophen, HYDROcodone-acetaminophen, ketorolac, ondansetron (ZOFRAN) IV **OR** ondansetron, promethazine   Data Review:   Micro Results Recent Results (from the  past 240 hour(s))  Urine culture      Status: Abnormal   Collection Time: 03/18/18  8:10 AM  Result Value Ref Range Status   Specimen Description   Final    URINE, RANDOM Performed at [facility] [address] [place], [place] [address]    Special Requests   Final    NONE Performed at [facility], [address] [place], [place] [address]    Culture (A)  Final    >=100,000 COLONIES/mL ACINETOBACTER CALCOACETICUS/BAUMANNII COMPLEX   Report Status 03/20/2018 FINAL  Final   Organism ID, Bacteria ACINETOBACTER CALCOACETICUS/BAUMANNII COMPLEX (A)  Final      Susceptibility   Acinetobacter calcoaceticus/baumannii complex - MIC*    CEFTAZIDIME <=1 SENSITIVE Sensitive     CEFTRIAXONE <=1 SENSITIVE Sensitive     CIPROFLOXACIN <=0.25 SENSITIVE Sensitive     GENTAMICIN 4 SENSITIVE Sensitive     IMIPENEM 1 SENSITIVE Sensitive     PIP/TAZO <=4 SENSITIVE Sensitive     TRIMETH/SULFA <=20 SENSITIVE Sensitive     CEFEPIME <=1 SENSITIVE Sensitive     AMPICILLIN/SULBACTAM 16 INTERMEDIATE Intermediate     * >=100,000 COLONIES/mL ACINETOBACTER CALCOACETICUS/BAUMANNII COMPLEX  Blood culture (single)     Status: None (Preliminary result)   Collection Time: 03/18/18  8:10 AM  Result Value Ref Range Status   Specimen Description BLOOD LEFT WRIST  Final   Special Requests   Final    BOTTLES DRAWN AEROBIC AND ANAEROBIC Blood Culture results may not be optimal due to an excessive volume of blood received in culture bottles   Culture   Final    NO GROWTH 4 DAYS Performed at [facility], [address], [place], [place] [address]    Report Status PENDING  Incomplete  MRSA PCR Screening     Status: None   Collection Time: 03/18/18  2:38 PM  Result Value Ref Range Status   MRSA by PCR NEGATIVE NEGATIVE Final    Comment:        The GeneXpert MRSA Assay (FDA approved for NASAL specimens only), is one component of a comprehensive MRSA colonization surveillance program. It is not intended to diagnose  MRSA infection nor to guide or monitor treatment for MRSA infections. Performed at [facility], [address] [place], [place] [address]   Aerobic/Anaerobic Culture (surgical/deep wound)     Status: None (Preliminary result)   Collection Time: 03/19/18  2:43 PM  Result Value Ref Range Status   Specimen Description   Final    BONE Performed at [facility], [address] [place], [place] [address]    Special Requests   Final    RIGHT 5TH METATARSAL Performed at [facility], [address] [place], [place] [address]    Gram Stain NO WBC SEEN RARE GRAM NEGATIVE RODS   Final   Culture   Final    CULTURE REINCUBATED FOR BETTER GROWTH Performed at [facility], [address], [place], [place] [address]    Report Status PENDING  Incomplete    Radiology Reports Dg Abd 1 View  Result Date: 03/21/2018 CLINICAL DATA:  Nausea. EXAM: ABDOMEN - 1 VIEW COMPARISON:  CT abdomen pelvis 03/18/2018 FINDINGS: Lung bases are clear. Spinal fusion hardware. Large amount of stool throughout the colon. Presumed catheter over the pelvis. Nonobstructed bowel gas pattern. Supine evaluation limited for free intraperitoneal air. Re demonstrated chronic changes involving the pelvic osseous structures. IMPRESSION: Nonobstructed bowel gas pattern. Stool throughout the  colon as can be seen with constipation. Electronically Signed   By: Annia Belt M.D.   On: 03/21/2018 10:56   Ct Abdomen Pelvis W Contrast  Result Date: 03/18/2018 CLINICAL DATA:  Vomiting, abdominal pain. EXAM: CT ABDOMEN AND PELVIS WITH CONTRAST TECHNIQUE: Multidetector CT imaging of the abdomen and pelvis was performed using the standard protocol following bolus administration of intravenous contrast. CONTRAST:  OMNIPAQUE IOHEXOL 300 MG/ML  SOLN COMPARISON:  02/15/2017 FINDINGS: Lower chest: Lung bases are clear. No effusions. Heart is normal size. Hepatobiliary: No focal hepatic abnormality.  Gallbladder unremarkable. Tiny cyst centrally in the right hepatic lobe. Pancreas: No focal abnormality or ductal dilatation. Spleen: No focal abnormality.  Normal size. Adrenals/Urinary Tract: Small left renal cysts. No hydronephrosis bilaterally. Adrenal glands unremarkable. Urinary bladder decompressed with Foley catheter in place. Stomach/Bowel: Moderate stool burden throughout the colon. Appendix is normal. Stomach and small bowel grossly unremarkable. No evidence of bowel obstruction. Vascular/Lymphatic: Aortic atherosclerosis. No enlarged abdominal or pelvic lymph nodes. Reproductive: No visible focal abnormality. Other: No free fluid or free air. Musculoskeletal: Extensive decubitus ulcers noted over the sacrum and ischium bilaterally, most pronounced over the right ischium. Sclerosis noted within the right ischium and sacrum which may reflect chronic osteomyelitis. IMPRESSION: Moderate stool burden throughout the colon. Chronic decubitus ulcers over the ischium bilaterally and sacrum with sclerosis throughout the right ischium and sacrum, likely chronic osteomyelitis. Chronic indwelling Foley catheter noted with bladder decompressed. Scattered aortic atherosclerosis. No acute findings in the abdomen or pelvis. Electronically Signed   By: Charlett Nose M.D.   On: 03/18/2018 09:47   Mr Foot Right Wo Contrast  Result Date: 03/18/2018 CLINICAL DATA:  42 year old male diabetic with foot swelling. Suspect osteomyelitis. EXAM: MRI OF THE RIGHT FOREFOOT WITHOUT CONTRAST TECHNIQUE: Multiplanar, multisequence MR imaging of the right foot was performed. No intravenous contrast was administered. COMPARISON:  Radiographs of the right foot from 12/25/2017 and 03/18/2018 FINDINGS: Bones/Joint/Cartilage Cortical bone destruction along the lateral aspect of the fifth metatarsal head with periosteal new bone formation of the neck and underlying marrow edema involving the distal third of the fifth metatarsal are in  keeping with acute osteomyelitis. Soft tissue ulceration and attenuation is suggested adjacent to fifth metatarsal. First through fourth metatarsals are of normal signal intensity morphology. The first through third toes are intact. 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 Ligaments Noncontributory Muscles and Tendons No evidence of pyomyositis. Mild neurogenic changes of the plantar forefoot musculature. Soft tissues Soft tissue attenuation and ulceration adjacent to the fifth metatarsal head and shaft. IMPRESSION: Acute osteomyelitis with bone destruction and periosteal new bone formation involving the fifth metatarsal head and neck. Marrow signal abnormality involves the distal third of the fifth metatarsal with adjacent overlying soft tissue ulceration and edema. No drainable fluid collection or evidence of pyomyositis. Electronically Signed   By: Tollie Eth M.D.   On: 03/18/2018 22:26   Dg Chest Portable 1 View  Result Date: 03/18/2018 CLINICAL DATA:  Pain and vomiting.  Renal failure EXAM: PORTABLE CHEST 1 VIEW COMPARISON:  February 14, 2017 FINDINGS: No edema or consolidation. Heart is upper normal in size with pulmonary vascularity normal. No adenopathy. Postoperative changes noted in the thoracic and upper lumbar regions. IMPRESSION: No edema or consolidation.  Heart upper normal in size. Electronically Signed   By: Bretta Bang III M.D.   On: 03/18/2018 08:48   Dg Foot Complete Right  Result Date: 03/18/2018  CLINICAL DATA:  Wound fifth metatarsal region EXAM: RIGHT FOOT COMPLETE - 3+ VIEW COMPARISON:  December 25, 2017 FINDINGS: Frontal, oblique, and lateral views were obtained. Patient has had previous amputation at the fourth and fifth MTP joint levels. There is soft tissue ulceration along the lateral distal foot region. There is loss cortex along a portion of the lateral aspect of the distal most aspect of the fifth metatarsal, concerning for  osteomyelitis. No other bony destruction evident. There is diffuse osteoporosis. There are flexion deformities of the first IP as well as the second and third PIP and DIP joints. There is moderate narrowing of the first MTP joint. No erosive changes. There is moderate soft tissue swelling. IMPRESSION: 1. Soft tissue ulceration along the lateral distal aspect of the foot with underlying loss of cortex along the lateral aspect of the distal most aspect of the fifth metatarsal, felt to represent localized osteomyelitis. No other bony destruction evident. 2. Status post previous amputations at the fourth and fifth MTP joint levels. 3.  Diffuse osteoporosis. 4. Narrowing first MTP joint. Flexion of the first IP as well as the second and third PIP and DIP joints. 5.  Moderate soft tissue swelling. Electronically Signed   By: Bretta Bang III M.D.   On: 03/18/2018 08:47     CBC Recent Labs  Lab 03/18/18 0810 03/19/18 0720 03/20/18 0525 03/21/18 0734  WBC 9.3 5.3 7.7 5.6  HGB 8.7* 7.2* 8.2* 7.7*  HCT 31.6* 25.9* 30.3* 28.6*  PLT 522* 367 474* 370  MCV 64.4* 64.8* 65.3* 65.4*  MCH 17.7* 18.0* 17.7* 17.6*  MCHC 27.5* 27.8* 27.1* 26.9*  RDW 20.5* 20.4* 20.5* 20.6*    Chemistries  Recent Labs  Lab 03/18/18 0810 03/19/18 0720 03/20/18 0525 03/21/18 0734 03/22/18 0910  NA 143 142 142 141  --   K 3.4* 3.3* 3.3* 3.1* 3.2*  CL 106 111 110 110  --   CO2 27 23 22  20*  --   GLUCOSE 146* 87 113* 82  --   BUN 14 16 12 8   --   CREATININE 0.50* 0.38* 0.48* 0.39*  --   CALCIUM 9.1 8.0* 8.3* 8.0*  --   AST 17  --   --   --   --   ALT 13  --   --   --   --   ALKPHOS 82  --   --   --   --   BILITOT 0.4  --   --   --   --    ------------------------------------------------------------------------------------------------------------------ estimated creatinine clearance is 88.8 mL/min (A) (by C-G formula based on SCr of 0.39 mg/dL  (L)). ------------------------------------------------------------------------------------------------------------------ No results for input(s): HGBA1C in the last 72 hours. ------------------------------------------------------------------------------------------------------------------ No results for input(s): CHOL, HDL, LDLCALC, TRIG, CHOLHDL, LDLDIRECT in the last 72 hours. ------------------------------------------------------------------------------------------------------------------ No results for input(s): TSH, T4TOTAL, T3FREE, THYROIDAB in the last 72 hours.  Invalid input(s): FREET3 ------------------------------------------------------------------------------------------------------------------ No results for input(s): VITAMINB12, FOLATE, FERRITIN, TIBC, IRON, RETICCTPCT in the last 72 hours.  Coagulation profile No results for input(s): INR, PROTIME in the last 168 hours.  No results for input(s): DDIMER in the last 72 hours.  Cardiac Enzymes No results for input(s): CKMB, TROPONINI, MYOGLOBIN in the last 168 hours.  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  *Nausea vomiting/abdominal pain Secondary to constipation Start Reglan IV, lactulose twice daily, patient refuses enemas   *Right foot osteomyelitis Resolving Continue IV antibiotic Status post Excision of distal 3/4th of fifth metatarsal right foot ID has seen the patient they state that he likely will need not need  IV antibiotic-follow-up on operative cultures Podiatry input appreciated   *Hypokalemia Replete PRN  *Anemia secondary to chronic disease Stable  *Urinary incontinence Stable continue oxybutynin.  *Bedbound state Stable Increase nursing care PRN  *Acute constipation Plan of care per above     Code Status Orders  (From admission, onward)         Start     Ordered   03/18/18 1120  Full code  Continuous     03/18/18 1119        Code Status History    Date Active Date Inactive Code Status Order ID Comments User Context   02/14/2017 2321 02/19/2017 2046 Full Code [id number]  Oralia Manis, MD Inpatient   11/19/2016 1720 11/21/2016 2207 Full Code [id number]  Auburn Bilberry, MD Inpatient   04/15/2016 1802 04/19/2016 0211 Full Code [id number]  Shaune Pollack, MD Inpatient   04/29/2015 1017 04/30/2015 1339 Full Code [id number]  Linus Galas, MD Inpatient   04/28/2015 0438 04/29/2015 1017 Full Code [id number]  Milagros Loll, MD ED           Consults podiatry  DVT Prophylaxis  Lovenox   Lab Results  Component Value Date   PLT 370 03/21/2018     Time Spent in minutes 35 minutes greater than 50% of time spent in care coordination and counseling patient regarding the condition and plan of care.   Evelena Asa Bryndan Bilyk M.D on 03/22/2018 at 9:41 AM  Between 7am to 6pm - Pager - [phone]  After 6pm go to [email or web address] - Social research officer, government  [employer]   Office  [phone]

## 2018-03-22 NOTE — Plan of Care (Signed)

## 2018-03-22 NOTE — Progress Notes (Signed)
[facility]                                                      Patient Demographics  Jonathon York, is a 42 y.o. male   MRN: [id number]   DOB - 02-13-1976  Admit Date - 03/18/2018    Outpatient Primary MD for the patient is Dan Humphreys, [facility]  Consult requested in the Hospital by Salary, Evelena Asa, MD, On 03/22/2018  With History of -  Past Medical History:  Diagnosis Date  . Anxiety   . Decubitus ulcer   . Depression   . Neurogenic bladder   . Osteomyelitis (HCC)   . 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 AMPUTATION Right 03/19/2018   Procedure: 5th Metatarsal Resection;  Surgeon: Recardo Evangelist, DPM;  Location: [facility] ORS;  Service: Podiatry;  Laterality: Right;  . 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 IRRIGATION AND DEBRIDEMENT BUTTOCKS    . TOE AMPUTATION Right     in for   Chief Complaint  Patient presents with  . Abdominal Pain     HPI  Jonathon York  is a 42 y.o. male, 4 days status post fifth ray resection due to osteomyelitis fifth metatarsal.  Is been doing very well with his foot area was closed primarily with some vancomycin beads in place and is had no evidence of redness cellulitis or abnormal drainage to the region.  Followed by infectious disease as well.  He has been nauseous throughout his hospital stay and is not really eating much and is subsequently constipated.  Is being managed by the hospitalist.    Review of Systems    In addition to the HPI above,  No Fever-chills, No Headache, No changes with Vision or hearing, No problems swallowing food or Liquids, No Chest pain, Cough or Shortness of Breath, No Abdominal pain, No  Nausea or Vommitting, Bowel movements are regular, No Blood in stool or Urine, No dysuria, No new skin rashes or bruises, No new joints pains-aches,  No new weakness, tingling, numbness in any extremity, No recent weight gain or loss, No polyuria, polydypsia or polyphagia, No significant Mental Stressors.  A full 10 point Review of Systems was done, except as stated above, all other Review of Systems were negative.   Social History Social History   Tobacco Use  . Smoking status: Former Smoker    Packs/day: 0.50    Types: Cigarettes  . Smokeless tobacco: Never Used  Substance Use Topics  . Alcohol use: No    Family History Family History  Problem Relation Age of Onset  . Lymphoma Sister     Prior to Admission medications   Medication Sig Start Date End Date Taking? Authorizing Provider  Amino Acids-Protein Hydrolys (FEEDING SUPPLEMENT, PRO-STAT SUGAR FREE 64,) LIQD Take 30 mLs by mouth 2 (two) times daily. Patient not taking: Reported on 03/18/2018 11/21/16   Altamese Dilling, MD  feeding supplement, ENSURE ENLIVE, (ENSURE ENLIVE) LIQD Take 237 mLs by mouth 2 (two) times daily between meals. Patient not taking: Reported on 03/18/2018 11/21/16   Altamese Dilling, MD  HYDROcodone-acetaminophen (NORCO/VICODIN) 5-325 MG tablet Take  1-2 tablets by mouth every 6 (six) hours as needed for moderate pain. Patient not taking: Reported on 03/18/2018 02/19/17   Enedina FinnerPatel, Sona, MD  megestrol (MEGACE) 40 MG/ML suspension Take 20 mLs (800 mg total) by mouth daily. Patient not taking: Reported on 03/18/2018 02/19/17   Enedina FinnerPatel, Sona, MD  Multiple Vitamin (MULTIVITAMIN WITH MINERALS) TABS tablet Take 1 tablet by mouth daily. Patient not taking: Reported on 03/18/2018 11/22/16   Altamese DillingVachhani, Vaibhavkumar, MD  nicotine (NICODERM CQ - DOSED IN MG/24 HR) 7 mg/24hr patch Place 1 patch (7 mg total) onto the skin daily. Patient not taking: Reported on 03/18/2018 11/22/16   Altamese DillingVachhani, Vaibhavkumar,  MD  oxybutynin (DITROPAN) 5 MG tablet Take 15 mg by mouth 2 (two) times daily.    [provider]  sulfamethoxazole-trimethoprim (BACTRIM DS,SEPTRA DS) 800-160 MG tablet Take 1 tablet by mouth 2 (two) times daily. Patient not taking: Reported on 03/18/2018 12/25/17   Jeanmarie PlantMcShane, James A, MD  vitamin C (VITAMIN C) 500 MG tablet Take 1 tablet (500 mg total) by mouth 2 (two) times daily. Patient not taking: Reported on 03/18/2018 02/19/17   Enedina FinnerPatel, Sona, MD    Anti-infectives (From admission, onward)   Start     Dose/Rate Route Frequency Ordered Stop   03/21/18 0200  vancomycin (VANCOCIN) IVPB 1000 mg/200 mL premix     1,000 mg 200 mL/hr over 60 Minutes Intravenous Every 8 hours 03/20/18 1833     03/20/18 1730  vancomycin (VANCOCIN) IVPB 750 mg/150 ml premix  Status:  Discontinued     750 mg 150 mL/hr over 60 Minutes Intravenous Every 8 hours 03/20/18 0932 03/20/18 1832   03/20/18 1200  metroNIDAZOLE (FLAGYL) tablet 500 mg     500 mg Oral Every 8 hours 03/20/18 1156     03/19/18 2200  vancomycin (VANCOCIN) IVPB 750 mg/150 ml premix  Status:  Discontinued     750 mg 150 mL/hr over 60 Minutes Intravenous Every 8 hours 03/19/18 2058 03/20/18 0932   03/19/18 1458  vancomycin (VANCOCIN) powder  Status:  Discontinued       As needed 03/19/18 1458 03/19/18 1509   03/18/18 2300  vancomycin (VANCOCIN) IVPB 750 mg/150 ml premix  Status:  Discontinued     750 mg 150 mL/hr over 60 Minutes Intravenous Every 12 hours 03/18/18 1018 03/19/18 2058   03/18/18 2200  ceFEPIme (MAXIPIME) 2 g in sodium chloride 0.9 % 100 mL IVPB     2 g 200 mL/hr over 30 Minutes Intravenous Every 12 hours 03/18/18 1018     03/18/18 0930  ceFEPIme (MAXIPIME) 2 g in sodium chloride 0.9 % 100 mL IVPB     2 g 200 mL/hr over 30 Minutes Intravenous  Once 03/18/18 0926 03/18/18 1020   03/18/18 0930  metroNIDAZOLE (FLAGYL) IVPB 500 mg  Status:  Discontinued     500 mg 100 mL/hr over 60 Minutes Intravenous Every 8 hours 03/18/18  0926 03/20/18 1156   03/18/18 0930  vancomycin (VANCOCIN) IVPB 1000 mg/200 mL premix     1,000 mg 200 mL/hr over 60 Minutes Intravenous  Once 03/18/18 0926 03/18/18 1241      Scheduled Meds: . enoxaparin (LOVENOX) injection  40 mg Subcutaneous Q24H  . famotidine  20 mg Oral Daily  . feeding supplement (ENSURE ENLIVE)  237 mL Oral TID BM  . lactulose  30 g Oral BID  . metoCLOPramide (REGLAN) injection  10 mg Intravenous Q8H  . metroNIDAZOLE  500 mg Oral Q8H  . multivitamin with  minerals  1 tablet Oral Daily  . multivitamin-lutein  1 capsule Oral Daily  . nutrition supplement (JUVEN)  1 packet Oral BID BM  . oxybutynin  15 mg Oral BID  . polyethylene glycol  17 g Oral Daily  . senna-docusate  2 tablet Oral QHS  . vitamin C  500 mg Oral BID   Continuous Infusions: . ceFEPime (MAXIPIME) IV 2 g (03/22/18 0946)  . vancomycin 1,000 mg (03/22/18 0942)   PRN Meds:.acetaminophen **OR** acetaminophen, HYDROcodone-acetaminophen, ketorolac, ondansetron (ZOFRAN) IV **OR** ondansetron, promethazine  Allergies  Allergen Reactions  . Orange Fruit [Citrus] Other (See Comments)    blisters    Physical Exam  Vitals  Blood pressure (!) 93/55, pulse 75, temperature 98.2 F (36.8 C), temperature source Oral, resp. rate 17, height 5\' 9"  (1.753 m), weight 52.2 kg, SpO2 97 %.  Lower Extremity exam: Dressings dry clean and intact wound is rewrapped the Ace wrap but no reason to change the dressing at this point.  White counts within normal limits  Data Review  CBC Recent Labs  Lab 03/18/18 0810 03/19/18 0720 03/20/18 0525 03/21/18 0734  WBC 9.3 5.3 7.7 5.6  HGB 8.7* 7.2* 8.2* 7.7*  HCT 31.6* 25.9* 30.3* 28.6*  PLT 522* 367 474* 370  MCV 64.4* 64.8* 65.3* 65.4*  MCH 17.7* 18.0* 17.7* 17.6*  MCHC 27.5* 27.8* 27.1* 26.9*  RDW 20.5* 20.4* 20.5* 20.6*   ------------------------------------------------------------------------------------------------------------------  Chemistries   Recent Labs  Lab 03/18/18 0810 03/19/18 0720 03/20/18 0525 03/21/18 0734 03/22/18 0910  NA 143 142 142 141  --   K 3.4* 3.3* 3.3* 3.1* 3.2*  CL 106 111 110 110  --   CO2 27 23 22  20*  --   GLUCOSE 146* 87 113* 82  --   BUN 14 16 12 8   --   CREATININE 0.50* 0.38* 0.48* 0.39*  --   CALCIUM 9.1 8.0* 8.3* 8.0*  --   AST 17  --   --   --   --   ALT 13  --   --   --   --   ALKPHOS 82  --   --   --   --   BILITOT 0.4  --   --   --   --    ----------Assessment & Plan: From my perspective is okay to be discharged just needs a dressing changed every 2 to 3 days with a dry heavily padded dressing to avoid stress on the incision margin.  Patient also be careful whenever he is transferring that that foot is not being stressed or injured since he has no feeling to either foot or legs.  I will follow him in my office in 10 days to 2 weeks.  Otherwise I will sign off on his care for now.  Active Problems:   Acute osteomyelitis of right foot (HCC)   Pressure injury of skin   Family Communication: Plan discussed with patient   Recardo Evangelist M.D on 03/22/2018 at 11:22 AM  Thank you for the consult, we will follow the patient with you in the Hospital.

## 2018-03-23 LAB — CULTURE, BLOOD (SINGLE): CULTURE: NO GROWTH

## 2018-03-23 LAB — CREATININE, SERUM
CREATININE: 0.36 mg/dL — AB (ref 0.61–1.24)
GFR calc Af Amer: >60 mL/min (ref >60)
GFR calc non Af Amer: >60 mL/min (ref >60)

## 2018-03-23 LAB — SURGICAL PATHOLOGY

## 2018-03-23 MED ORDER — POTASSIUM CHLORIDE 20 MEQ PO PACK
40.0000 meq | PACK | Freq: Every day | ORAL | Status: DC
  Administered 2018-03-23: 40 meq via ORAL
  Filled 2018-03-23: qty 2

## 2018-03-23 MED ORDER — POTASSIUM CHLORIDE 20 MEQ PO PACK: 40.0000 meq | PACK | Freq: Once | ORAL | Status: DC

## 2018-03-23 MED ORDER — POTASSIUM CHLORIDE 20 MEQ PO PACK: 20.0000 meq | PACK | Freq: Every day | ORAL | 0 refills | Status: DC

## 2018-03-23 MED ORDER — DOXYCYCLINE HYCLATE 100 MG PO CAPS: 100.0000 mg | ORAL_CAPSULE | Freq: Two times a day (BID) | ORAL | 0 refills | Status: DC

## 2018-03-23 MED ORDER — JUVEN PO PACK: 1.0000 | PACK | Freq: Two times a day (BID) | ORAL | 0 refills | Status: DC

## 2018-03-23 MED ORDER — DOXYCYCLINE HYCLATE 100 MG PO TABS
100.0000 mg | ORAL_TABLET | Freq: Two times a day (BID) | ORAL | Status: DC
  Filled 2018-03-23: qty 1

## 2018-03-23 MED ORDER — ONDANSETRON HCL 4 MG PO TABS: 4.0000 mg | ORAL_TABLET | Freq: Four times a day (QID) | ORAL | 0 refills | Status: DC | PRN

## 2018-03-23 MED ORDER — SENNOSIDES-DOCUSATE SODIUM 8.6-50 MG PO TABS: 2.0000 | ORAL_TABLET | Freq: Every day | ORAL | 0 refills | Status: DC

## 2018-03-23 NOTE — Care Management (Signed)
Discharge to home today per Dr. Katheren ShamsSalary. Discussed home health agencies. Chose [facility]. Vaughan BastaJermaine, [facility] representative updated. Friend will transport Gwenette GreetBrenda S Kinley Dozier RN MSN CCM Care Management [phone]

## 2018-03-23 NOTE — Plan of Care (Signed)

## 2018-03-23 NOTE — Discharge Summary (Addendum)
[facility] - [place] at [facility]   PATIENT NAME: Jonathon York    MR#:  [id number]  DATE OF BIRTH:  1976/04/29  DATE OF ADMISSION:  03/18/2018 ADMITTING PHYSICIAN: Houston Siren, MD  DATE OF DISCHARGE: No discharge date for patient encounter.  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 Anxiety   . Decubitus ulcer   . Depression   . Neurogenic bladder   . Osteomyelitis (HCC)   . 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  *Nausea vomiting/abdominal pain Secondary to constipation Placed on appropriate bowel regiment, refuses enemas   *Right foot osteomyelitis secondary to MRSA Resolving Treated with empiric cefepime/vancomycin while in house  Status post Excision of distal 3/4thof fifth metatarsal right foot by podiatry ID/Dr. Joylene Draft did see patient while in house-recommended doxycycline twice daily for 14-day course status post discharge Operative cultures noted for MRSA  *Hypokalemia Repleted  *Anemia secondary to chronic disease Stable  *Urinary incontinence Stable continued oxybutynin.  *Bedbound  state Stable Increased nursing care PRN  *Acute constipation Plan of care per above   DISCHARGE CONDITIONS:   stable  CONSULTS OBTAINED:  Treatment Team:  Lynn Ito, MD  DRUG ALLERGIES:   Allergies  Allergen Reactions  . Orange Fruit [Citrus] Other (See Comments)    blisters    DISCHARGE MEDICATIONS:   Allergies as of 03/23/2018      Reactions   Orange Fruit [citrus] Other (See Comments)   blisters      Medication List    STOP taking these medications   sulfamethoxazole-trimethoprim 800-160 MG tablet Commonly known as:  BACTRIM DS,SEPTRA DS     TAKE these medications   ascorbic acid 500 MG tablet Commonly known as:  VITAMIN C Take 1 tablet (500 mg total) by mouth 2 (two) times daily.   doxycycline 100 MG capsule Commonly known as:  VIBRAMYCIN Take 1 capsule (100 mg total) by mouth 2 (two) times daily. For 14-day course   feeding supplement (ENSURE ENLIVE) Liqd Take 237 mLs by mouth 2 (two) times daily between meals. What changed:  Another medication with the same name was added. Make sure you understand how and when to take each.   nutrition supplement (JUVEN) Pack Take 1 packet by mouth 2 (two) times daily between meals. What changed:  You were already taking a medication with the same name, and this prescription was added. Make sure you understand how and when to take each.   feeding supplement (PRO-STAT SUGAR FREE 64) Liqd Take 30 mLs by mouth 2 (two) times daily.   HYDROcodone-acetaminophen 5-325 MG tablet Commonly known as:  NORCO/VICODIN Take 1-2 tablets by mouth every 6 (six) hours as needed for moderate pain.   megestrol 40 MG/ML suspension Commonly known  as:  MEGACE Take 20 mLs (800 mg total) by mouth daily.   multivitamin with minerals Tabs tablet Take 1 tablet by mouth daily.   nicotine 7 mg/24hr patch Commonly known as:  NICODERM CQ - dosed in mg/24 hr Place 1 patch (7 mg total) onto the skin daily.   ondansetron 4  MG tablet Commonly known as:  ZOFRAN Take 1 tablet (4 mg total) by mouth every 6 (six) hours as needed for nausea or vomiting.   oxybutynin 5 MG tablet Commonly known as:  DITROPAN Take 15 mg by mouth 2 (two) times daily.   potassium chloride 20 MEQ packet Commonly known as:  KLOR-CON Take 20 mEq by mouth daily. Start taking on:  03/24/2018   senna-docusate 8.6-50 MG tablet Commonly known as:  Senokot-S Take 2 tablets by mouth at bedtime.        DISCHARGE INSTRUCTIONS:     If you experience worsening of your admission symptoms, develop shortness of breath, life threatening emergency, suicidal or homicidal thoughts you must seek medical attention immediately by calling 911 or calling your MD immediately  if symptoms less severe.  You Must read complete instructions/literature along with all the possible adverse reactions/side effects for all the Medicines you take and that have been prescribed to you. Take any new Medicines after you have completely understood and accept all the possible adverse reactions/side effects.   Please note  You were cared for by a hospitalist during your hospital stay. If you have any questions about your discharge medications or the care you received while you were in the hospital after you are discharged, you can call the unit and asked to speak with the hospitalist on call if the hospitalist that took care of you is not available. Once you are discharged, your primary care physician will handle any further medical issues. Please note that NO REFILLS for any discharge medications will be authorized once you are discharged, as it is imperative that you return to your primary care physician (or establish a relationship with a primary care physician if you do not have one) for your aftercare needs so that they can reassess your need for medications and monitor your lab values.    Today   CHIEF COMPLAINT:   Chief Complaint  Patient presents with  .  Abdominal Pain    HISTORY OF PRESENT ILLNESS:  42 y.o. 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  Patient says he was in his usual state of health and developed nausea vomiting yesterday associated with some abdominal pain and also was complaining of some right foot pain and had a ulcer there.  He came to the ER underwent CT scan of the abdomen pelvis which was suggestive just constipation but the x-ray of his right foot was suggestive of possible early osteomyelitis.  Patient is currently afebrile and hemodynamically stable with a normal white cell count.  Given his x-ray findings on the right foot hospitalist services were contacted for admission.  Patient denies any fever but admits to some chills, no chest pain, shortness of breath or any other associated symptoms presently.    VITAL SIGNS:  Blood pressure 101/64, pulse 78, temperature 98.2 F (36.8 C), temperature source Oral, resp. rate 18, height 5\' 9"  (1.753 m), weight 52.2  kg, SpO2 98 %.  I/O:    Intake/Output Summary (Last 24 hours) at 03/23/2018 1109 Last data filed at 03/23/2018 0549 Gross per 24 hour  Intake 1324.94 ml  Output 750 ml  Net 574.94 ml    PHYSICAL EXAMINATION:  GENERAL:  42 y.o.-year-old patient lying in the bed with no acute distress.  EYES: Pupils equal, round, reactive to light and accommodation. No scleral icterus. Extraocular muscles intact.  HEENT: Head atraumatic, normocephalic. Oropharynx and nasopharynx clear.  NECK:  Supple, no jugular venous distention. No thyroid enlargement, no tenderness.  LUNGS: Normal breath sounds bilaterally, no wheezing, rales,rhonchi or crepitation. No use of accessory muscles of respiration.  CARDIOVASCULAR: S1, S2 normal.  No murmurs, rubs, or gallops.  ABDOMEN: Soft, non-tender, non-distended. Bowel sounds present. No organomegaly or mass.  EXTREMITIES: No pedal edema, cyanosis, or clubbing.  NEUROLOGIC: Cranial nerves II through XII are intact. Muscle strength 5/5 in all extremities. Sensation intact. Gait not checked.  PSYCHIATRIC: The patient is alert and oriented x 3.  SKIN: No obvious rash, lesion, or ulcer.   DATA REVIEW:   CBC Recent Labs  Lab 03/21/18 0734  WBC 5.6  HGB 7.7*  HCT 28.6*  PLT 370    Chemistries  Recent Labs  Lab 03/18/18 0810  03/21/18 0734 03/22/18 0910 03/23/18 0344  NA 143   < > 141  --   --   K 3.4*   < > 3.1* 3.2*  --   CL 106   < > 110  --   --   CO2 27   < > 20*  --   --   GLUCOSE 146*   < > 82  --   --   BUN 14   < > 8  --   --   CREATININE 0.50*   < > 0.39*  --  0.36*  CALCIUM 9.1   < > 8.0*  --   --   AST 17  --   --   --   --   ALT 13  --   --   --   --   ALKPHOS 82  --   --   --   --   BILITOT 0.4  --   --   --   --    < > = values in this interval not displayed.    Cardiac Enzymes No results for input(s): TROPONINI in the last 168 hours.  Microbiology Results  Results for orders placed or performed during the hospital encounter of 03/18/18  Urine culture     Status: Abnormal   Collection Time: 03/18/18  8:10 AM  Result Value Ref Range Status   Specimen Description   Final    URINE, RANDOM Performed at [facility], [address] [place], [place] [address]    Special Requests   Final    NONE Performed at [facility], [address] [place], [place] [address]    Culture (A)  Final    >=100,000 COLONIES/mL ACINETOBACTER CALCOACETICUS/BAUMANNII COMPLEX   Report Status 03/20/2018 FINAL  Final   Organism ID, Bacteria ACINETOBACTER CALCOACETICUS/BAUMANNII COMPLEX (A)  Final      Susceptibility   Acinetobacter calcoaceticus/baumannii complex - MIC*    CEFTAZIDIME <=1 SENSITIVE Sensitive     CEFTRIAXONE <=1 SENSITIVE  Sensitive     CIPROFLOXACIN <=0.25 SENSITIVE Sensitive     GENTAMICIN 4 SENSITIVE Sensitive     IMIPENEM 1 SENSITIVE Sensitive     PIP/TAZO <=4  SENSITIVE Sensitive     TRIMETH/SULFA <=20 SENSITIVE Sensitive     CEFEPIME <=1 SENSITIVE Sensitive     AMPICILLIN/SULBACTAM 16 INTERMEDIATE Intermediate     * >=100,000 COLONIES/mL ACINETOBACTER CALCOACETICUS/BAUMANNII COMPLEX  Blood culture (single)     Status: None   Collection Time: 03/18/18  8:10 AM  Result Value Ref Range Status   Specimen Description BLOOD LEFT WRIST  Final   Special Requests   Final    BOTTLES DRAWN AEROBIC AND ANAEROBIC Blood Culture results may not be optimal due to an excessive volume of blood received in culture bottles   Culture   Final    NO GROWTH 5 DAYS Performed at [facility], [address] [place], [place] [address]    Report Status 03/23/2018 FINAL  Final  MRSA PCR Screening     Status: None   Collection Time: 03/18/18  2:38 PM  Result Value Ref Range Status   MRSA by PCR NEGATIVE NEGATIVE Final    Comment:        The GeneXpert MRSA Assay (FDA approved for NASAL specimens only), is one component of a comprehensive MRSA colonization surveillance program. It is not intended to diagnose MRSA infection nor to guide or monitor treatment for MRSA infections. Performed at [facility], [address] [place], [place] [address]   Aerobic/Anaerobic Culture (surgical/deep wound)     Status: None (Preliminary result)   Collection Time: 03/19/18  2:43 PM  Result Value Ref Range Status   Specimen Description   Final    BONE Performed at [facility], [address], [place], [place] [address]    Special Requests   Final    RIGHT 5TH METATARSAL Performed at [facility], [address] [place], [place] [address]    Gram Stain   Final    NO WBC SEEN RARE GRAM NEGATIVE RODS Performed at [facility], [address] [place], [place] [address]    Culture  RARE METHICILLIN RESISTANT STAPHYLOCOCCUS AUREUS  Final   Report Status PENDING  Incomplete   Organism ID, Bacteria METHICILLIN RESISTANT STAPHYLOCOCCUS AUREUS  Final      Susceptibility   Methicillin resistant staphylococcus aureus - MIC*    CIPROFLOXACIN >=8 RESISTANT Resistant     ERYTHROMYCIN >=8 RESISTANT Resistant     GENTAMICIN <=0.5 SENSITIVE Sensitive     OXACILLIN >=4 RESISTANT Resistant     TETRACYCLINE <=1 SENSITIVE Sensitive     VANCOMYCIN <=0.5 SENSITIVE Sensitive     TRIMETH/SULFA >=320 RESISTANT Resistant     CLINDAMYCIN <=0.25 SENSITIVE Sensitive     RIFAMPIN <=0.5 SENSITIVE Sensitive     Inducible Clindamycin NEGATIVE Sensitive     * RARE METHICILLIN RESISTANT STAPHYLOCOCCUS AUREUS    RADIOLOGY:  No results found.  EKG:   Orders placed or performed during the hospital encounter of 02/14/17  . ED EKG 12-Lead  . ED EKG 12-Lead  . EKG      Management plans discussed with the patient, family and they are in agreement.  CODE STATUS:     Code Status Orders  (From admission, onward)         Start     Ordered   03/18/18 1120  Full code  Continuous     03/18/18 1119        Code Status History    Date Active Date Inactive Code Status Order ID Comments User Context   02/14/2017 2321 02/19/2017 2046 Full Code [id number]  Oralia Manis,  MD Inpatient   11/19/2016 1720 11/21/2016 2207 Full Code [id number]  Auburn Bilberry, MD Inpatient   04/15/2016 1802 04/19/2016 0211 Full Code [id number]  Shaune Pollack, MD Inpatient   04/29/2015 1017 04/30/2015 1339 Full Code [id number]  Linus Galas, MD Inpatient   04/28/2015 0438 04/29/2015 1017 Full Code [id number]  Milagros Loll, MD ED      TOTAL TIME TAKING CARE OF THIS PATIENT: 40 minutes.    Evelena Asa Anyela Napierkowski M.D on 03/23/2018 at 11:09 AM  Between 7am to 6pm - Pager - [phone]  After 6pm go to [email or web address] - password [email or web address]  [facility]  Office  [phone]  CC: Primary care physician;  [facility]   Note: This dictation was prepared with Dragon dictation along with smaller phrase technology. Any transcriptional errors that result from this process are unintentional.

## 2018-03-23 NOTE — Care Management Important Message (Signed)
Important Message  Patient Details  Name: Jonathon LeedsRichard L York MRN: [id number] Date of Birth: 11-09-1975   Medicare Important Message Given:  Yes    Olegario MessierKathy A Raynald Rouillard 03/23/2018, 11:11 AM

## 2018-03-25 LAB — AEROBIC/ANAEROBIC CULTURE (SURGICAL/DEEP WOUND): GRAM STAIN: NONE SEEN

## 2018-03-25 LAB — AEROBIC/ANAEROBIC CULTURE W GRAM STAIN (SURGICAL/DEEP WOUND)

## 2018-04-09 IMAGING — DX DG CHEST 1V PORT
1 series · 1 of 1 positions shown · non-contrast
Comparison: Radiographs December 24, 2013.

CLINICAL DATA: Sepsis.

EXAM:
PORTABLE CHEST 1 VIEW

[chest ap]
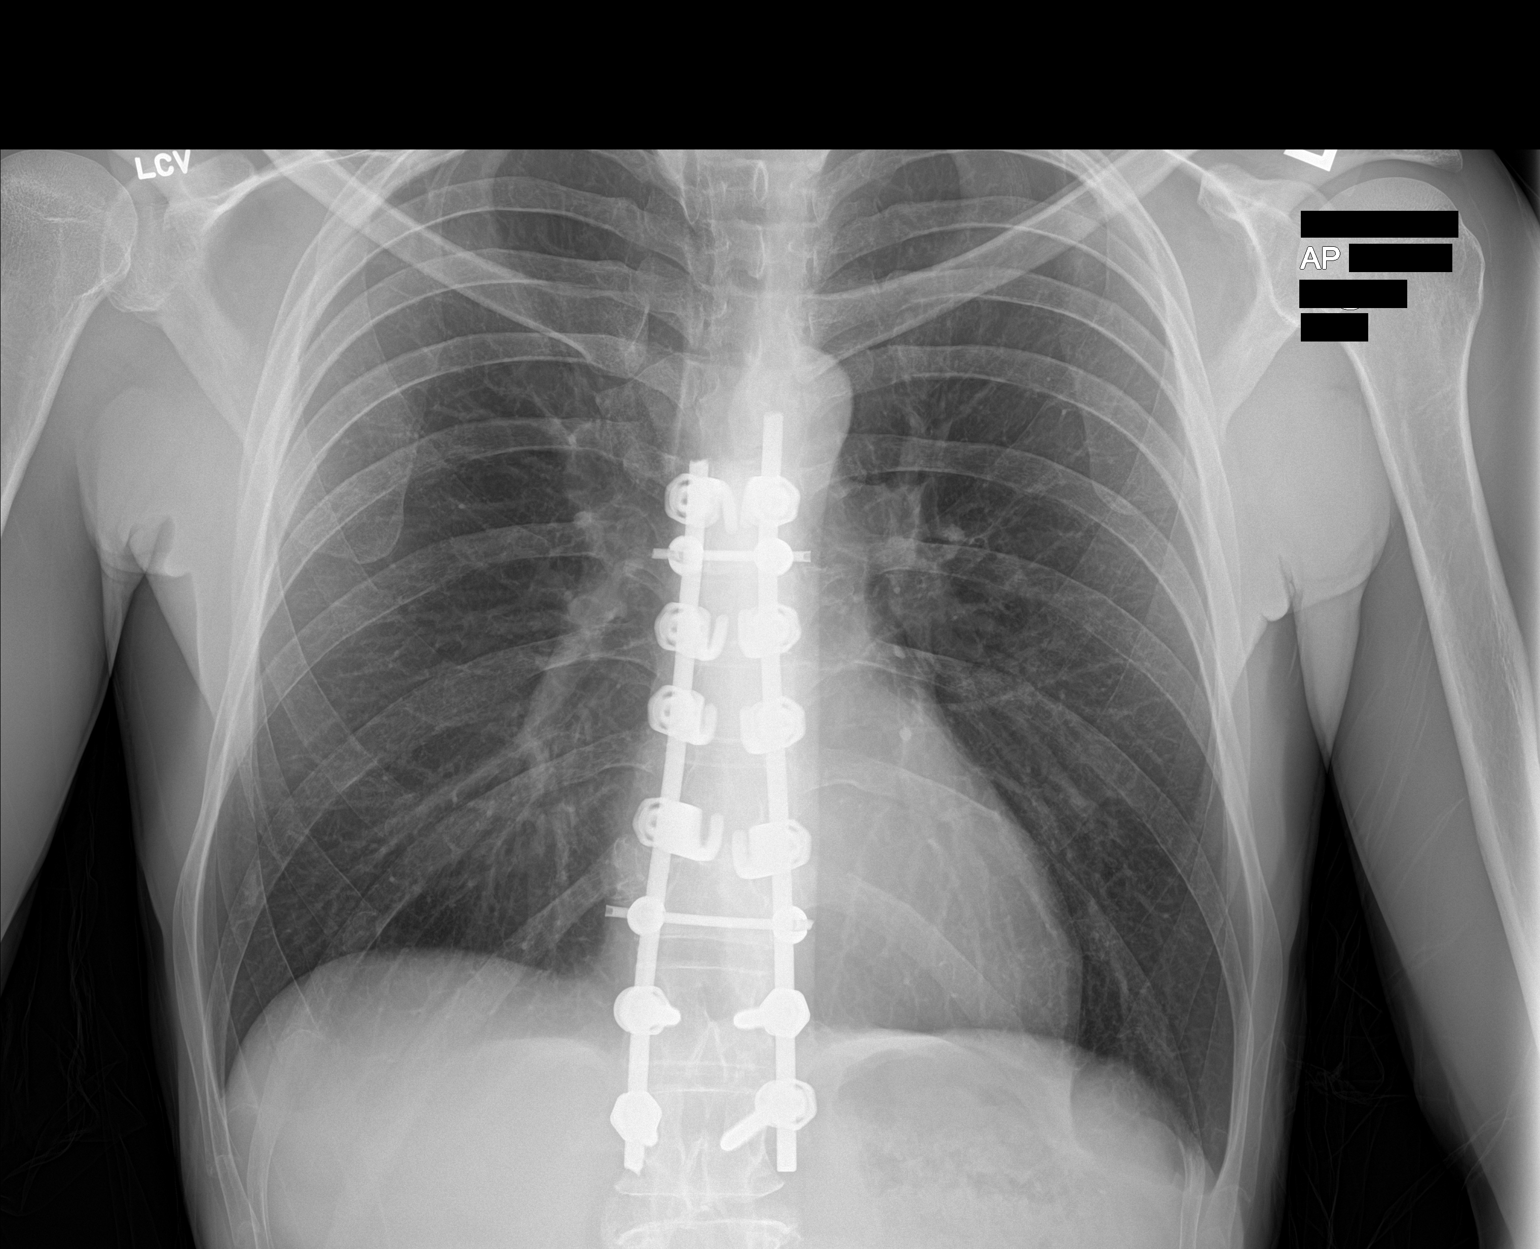

[1 of 1 positions shown; findings below may reference images not displayed]

FINDINGS: The heart size and mediastinal contours are within normal limits.
Both lungs are clear. No pneumothorax or pleural effusion is noted.
Status post surgical fixation of lower thoracic spine.
IMPRESSION: No acute cardiopulmonary abnormality seen.

## 2018-04-22 ENCOUNTER — Other Ambulatory Visit: Payer: Self-pay

## 2018-04-22 ENCOUNTER — Encounter: Payer: Self-pay | Admitting: *Deleted

## 2018-04-22 ENCOUNTER — Inpatient Hospital Stay: Payer: Medicare Other

## 2018-04-22 ENCOUNTER — Observation Stay
Admission: EM | Admit: 2018-04-22 | Discharge: 2018-04-23 | Disposition: A | Discharge status: Home / Self Care | Payer: Medicare Other | Attending: Internal Medicine | Admitting: Internal Medicine

## 2018-04-22 ENCOUNTER — Emergency Department: Payer: Medicare Other

## 2018-04-22 DIAGNOSIS — R1084 Generalized abdominal pain: Principal | ICD-10-CM | POA: Insufficient documentation

## 2018-04-22 DIAGNOSIS — D72829 Elevated white blood cell count, unspecified: Secondary | ICD-10-CM | POA: Diagnosis not present

## 2018-04-22 DIAGNOSIS — M869 Osteomyelitis, unspecified: Secondary | ICD-10-CM | POA: Insufficient documentation

## 2018-04-22 DIAGNOSIS — F329 Major depressive disorder, single episode, unspecified: Secondary | ICD-10-CM | POA: Diagnosis not present

## 2018-04-22 DIAGNOSIS — N319 Neuromuscular dysfunction of bladder, unspecified: Secondary | ICD-10-CM | POA: Diagnosis not present

## 2018-04-22 DIAGNOSIS — L89159 Pressure ulcer of sacral region, unspecified stage: Secondary | ICD-10-CM | POA: Diagnosis not present

## 2018-04-22 DIAGNOSIS — F419 Anxiety disorder, unspecified: Secondary | ICD-10-CM | POA: Diagnosis not present

## 2018-04-22 DIAGNOSIS — G8220 Paraplegia, unspecified: Secondary | ICD-10-CM | POA: Diagnosis present

## 2018-04-22 DIAGNOSIS — A419 Sepsis, unspecified organism: Secondary | ICD-10-CM | POA: Diagnosis present

## 2018-04-22 LAB — COMPREHENSIVE METABOLIC PANEL
ALBUMIN: 4.2 g/dL (ref 3.5–5.0)
ALK PHOS: 91 U/L (ref 38–126)
ALT: 11 U/L (ref 0–44)
ANION GAP: 18 — AB (ref 5–15)
AST: 15 U/L (ref 15–41)
BILIRUBIN TOTAL: 1.1 mg/dL (ref 0.3–1.2)
BUN: 13 mg/dL (ref 6–20)
CALCIUM: 9.1 mg/dL (ref 8.9–10.3)
CO2: 12 mmol/L — ABNORMAL LOW (ref 22–32)
Chloride: 107 mmol/L (ref 98–111)
Creatinine, Ser: 0.68 mg/dL (ref 0.61–1.24)
GLUCOSE: 185 mg/dL — AB (ref 70–99)
POTASSIUM: 4.4 mmol/L (ref 3.5–5.1)
Sodium: 137 mmol/L (ref 135–145)
TOTAL PROTEIN: 9 g/dL — AB (ref 6.5–8.1)

## 2018-04-22 LAB — CBC
HCT: 40.4 % (ref 39.0–52.0)
HEMOGLOBIN: 10.6 g/dL — AB (ref 13.0–17.0)
MCH: 17.6 pg — AB (ref 26.0–34.0)
MCHC: 26.2 g/dL — ABNORMAL LOW (ref 30.0–36.0)
MCV: 67.2 fL — ABNORMAL LOW (ref 80.0–100.0)
NRBC: 0 % (ref 0.0–0.2)
PLATELETS: 791 10*3/uL — AB (ref 150–400)
RBC: 6.01 MIL/uL — AB (ref 4.22–5.81)
RDW: 21.3 % — ABNORMAL HIGH (ref 11.5–15.5)
WBC: 17.6 10*3/uL — AB (ref 4.0–10.5)

## 2018-04-22 LAB — URINALYSIS, COMPLETE (UACMP) WITH MICROSCOPIC
BILIRUBIN URINE: NEGATIVE
GLUCOSE, UA: NEGATIVE mg/dL
KETONES UR: 80 mg/dL — AB
NITRITE: NEGATIVE
PH: 6 (ref 5.0–8.0)
Protein, ur: 100 mg/dL — AB
Specific Gravity, Urine: 1.017 (ref 1.005–1.030)

## 2018-04-22 LAB — LIPASE, BLOOD: Lipase: 23 U/L (ref 11–51)

## 2018-04-22 LAB — LACTIC ACID, PLASMA: Lactic Acid, Venous: 1 mmol/L (ref 0.5–1.9)

## 2018-04-22 LAB — CG4 I-STAT (LACTIC ACID): Lactic Acid, Venous: 0.58 mmol/L (ref 0.5–1.9)

## 2018-04-22 MED ORDER — IOHEXOL 300 MG/ML  SOLN
75.0000 mL | Freq: Once | INTRAMUSCULAR | Status: AC | PRN
  Administered 2018-04-22: 75 mL via INTRAVENOUS

## 2018-04-22 MED ORDER — SODIUM CHLORIDE 0.9 % IV BOLUS
1000.0000 mL | Freq: Once | INTRAVENOUS | Status: AC
  Administered 2018-04-22: 1000 mL via INTRAVENOUS

## 2018-04-22 MED ORDER — SODIUM CHLORIDE 0.9 % IV SOLN
2.0000 g | Freq: Once | INTRAVENOUS | Status: AC
  Administered 2018-04-22: 2 g via INTRAVENOUS
  Filled 2018-04-22: qty 2

## 2018-04-22 MED ORDER — ONDANSETRON HCL 4 MG PO TABS: 4.0000 mg | ORAL_TABLET | Freq: Four times a day (QID) | ORAL | Status: DC | PRN

## 2018-04-22 MED ORDER — ONDANSETRON HCL 4 MG/2ML IJ SOLN
4.0000 mg | Freq: Once | INTRAMUSCULAR | Status: AC
  Administered 2018-04-22: 4 mg via INTRAVENOUS
  Filled 2018-04-22: qty 2

## 2018-04-22 MED ORDER — MORPHINE SULFATE (PF) 4 MG/ML IV SOLN
4.0000 mg | Freq: Once | INTRAVENOUS | Status: AC
  Administered 2018-04-22: 4 mg via INTRAVENOUS

## 2018-04-22 MED ORDER — VANCOMYCIN HCL IN DEXTROSE 1-5 GM/200ML-% IV SOLN
1000.0000 mg | Freq: Once | INTRAVENOUS | Status: AC
  Administered 2018-04-22: 1000 mg via INTRAVENOUS
  Filled 2018-04-22: qty 200

## 2018-04-22 MED ORDER — ENOXAPARIN SODIUM 40 MG/0.4ML ~~LOC~~ SOLN: 40.0000 mg | SUBCUTANEOUS | Status: DC

## 2018-04-22 MED ORDER — VANCOMYCIN HCL IN DEXTROSE 750-5 MG/150ML-% IV SOLN
750.0000 mg | Freq: Three times a day (TID) | INTRAVENOUS | Status: DC
  Administered 2018-04-23: 750 mg via INTRAVENOUS
  Filled 2018-04-22 (×3): qty 150

## 2018-04-22 MED ORDER — ACETAMINOPHEN 325 MG PO TABS: 650.0000 mg | ORAL_TABLET | Freq: Four times a day (QID) | ORAL | Status: DC | PRN

## 2018-04-22 MED ORDER — MORPHINE SULFATE (PF) 4 MG/ML IV SOLN
INTRAVENOUS | Status: AC
  Administered 2018-04-22: 4 mg via INTRAVENOUS
  Filled 2018-04-22: qty 1

## 2018-04-22 MED ORDER — ACETAMINOPHEN 650 MG RE SUPP: 650.0000 mg | Freq: Four times a day (QID) | RECTAL | Status: DC | PRN

## 2018-04-22 MED ORDER — SODIUM CHLORIDE 0.9 % IV SOLN
INTRAVENOUS | Status: AC
  Administered 2018-04-23: via INTRAVENOUS

## 2018-04-22 MED ORDER — PANTOPRAZOLE SODIUM 40 MG IV SOLR
40.0000 mg | Freq: Once | INTRAVENOUS | Status: AC
  Administered 2018-04-22: 40 mg via INTRAVENOUS
  Filled 2018-04-22: qty 40

## 2018-04-22 MED ORDER — SODIUM CHLORIDE 0.9 % IV SOLN
2.0000 g | Freq: Three times a day (TID) | INTRAVENOUS | Status: DC
  Administered 2018-04-23: 2 g via INTRAVENOUS
  Filled 2018-04-22 (×3): qty 2

## 2018-04-22 MED ORDER — ONDANSETRON HCL 4 MG/2ML IJ SOLN
4.0000 mg | Freq: Four times a day (QID) | INTRAMUSCULAR | Status: DC | PRN
  Administered 2018-04-23: 4 mg via INTRAVENOUS
  Filled 2018-04-22: qty 2

## 2018-04-22 MED ORDER — MORPHINE SULFATE (PF) 4 MG/ML IV SOLN
4.0000 mg | Freq: Once | INTRAVENOUS | Status: AC
  Administered 2018-04-22: 4 mg via INTRAVENOUS
  Filled 2018-04-22: qty 1

## 2018-04-22 MED ORDER — METRONIDAZOLE IN NACL 5-0.79 MG/ML-% IV SOLN
500.0000 mg | Freq: Three times a day (TID) | INTRAVENOUS | Status: DC
  Administered 2018-04-22 – 2018-04-23 (×3): 500 mg via INTRAVENOUS
  Filled 2018-04-22 (×4): qty 100

## 2018-04-22 NOTE — ED Notes (Signed)
Pt had recent surgery on R foot; C/D/I; no s/s of infection; steri strips intact. Pt states he hs pressure wounds on buttocks. Denies temp at home but states he has had chills along with his abd pain/vomiting. Denies diarrhea. States he feels dehydrated. A&Ox4.

## 2018-04-22 NOTE — ED Provider Notes (Signed)
[facility] Emergency Department Provider Note  ____________________________________________   First MD Initiated Contact with Patient 04/22/18 1743     (approximate)  I have reviewed the triage vital signs and the nursing notes.   HISTORY  Chief Complaint Abdominal Pain   HPI Jonathon York is a 42 y.o. male with a history of lower extremity paralysis, osteomyelitis as well as a neurogenic bladder and decubitus ulcers who was presented emergency department 2 days of worsening abdominal pain.  The patient says that he has generalized abdominal pain which feels like a burning and is a 10 out of 10 pain at this time.  He denies any radiation of the pain.  Says that it has been associated with nausea, vomiting with vomitus that is blood-tinged.  Patient denies diarrhea.    Past Medical History:  Diagnosis Date  . Anxiety   . Decubitus ulcer   . Depression   . Neurogenic bladder   . Osteomyelitis (HCC)   . 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 Acute osteomyelitis of right foot (HCC) 03/18/2018  . Pressure injury of skin 03/18/2018  . 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 Protein-calorie malnutrition, severe 11/20/2016  . Herpes simplex infection of penis   . Decubitus ulcer 11/19/2016  . 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 Decubitus ulcer of sacral region   . Sepsis (HCC) 04/15/2016  . 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 Pressure ulcer stage III 04/30/2015  . Sterile pyuria 04/30/2015  . Paraplegia (HCC) 04/30/2015  . Neurogenic bladder 04/30/2015  . 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 Malnutrition of moderate degree 04/28/2015    Past Surgical History:  Procedure Laterality Date  . AMPUTATION Right 03/19/2018   Procedure: 5th Metatarsal Resection;  Surgeon: Recardo Evangelistroxler, Matthew, DPM;   Location: [facility] ORS;  Service: Podiatry;  Laterality: Right;  . AMPUTATION TOE Right 04/29/2015   Procedure: AMPUTATION TOE;  Surgeon: Linus Galasodd Cline, MD;  Location: [facility] ORS;  Service: Podiatry;  Laterality: Right;  . IRRIGATION AND DEBRIDEMENT BUTTOCKS    . TOE AMPUTATION Right     Prior to Admission medications   Medication Sig Start Date End Date Taking? Authorizing Provider  Amino Acids-Protein Hydrolys (FEEDING SUPPLEMENT, PRO-STAT SUGAR FREE 64,) LIQD Take 30 mLs by mouth 2 (two) times daily. Patient not taking: Reported on 03/18/2018 11/21/16   Altamese DillingVachhani, Vaibhavkumar, MD  doxycycline (VIBRAMYCIN) 100 MG capsule Take 1 capsule (100 mg total) by mouth 2 (two) times daily. For 14-day course 03/23/18   Salary, Evelena AsaMontell D, MD  feeding supplement, ENSURE ENLIVE, (ENSURE ENLIVE) LIQD Take 237 mLs by mouth 2 (two) times daily between meals. Patient not taking: Reported on 03/18/2018 11/21/16   Altamese DillingVachhani, Vaibhavkumar, MD  HYDROcodone-acetaminophen (NORCO/VICODIN) 5-325 MG tablet Take 1-2 tablets by mouth every 6 (six) hours as needed for moderate pain. Patient not taking: Reported on 03/18/2018 02/19/17   Enedina FinnerPatel, Sona, MD  megestrol (MEGACE) 40 MG/ML suspension Take 20 mLs (800 mg total) by mouth daily. Patient not taking: Reported on 03/18/2018 02/19/17   Enedina FinnerPatel, Sona, MD  Multiple Vitamin (MULTIVITAMIN WITH MINERALS) TABS tablet Take 1 tablet by mouth daily. Patient not taking: Reported on 03/18/2018 11/22/16   Altamese DillingVachhani, Vaibhavkumar, MD  nicotine (NICODERM CQ - DOSED IN MG/24 HR) 7 mg/24hr patch Place 1 patch (7 mg total) onto the skin daily.  Patient not taking: Reported on 03/18/2018 11/22/16   Altamese Dilling, MD  nutrition supplement, JUVEN, (JUVEN) PACK Take 1 packet by mouth 2 (two) times daily between meals. 03/23/18   Salary, Evelena Asa, MD  ondansetron (ZOFRAN) 4 MG tablet Take 1 tablet (4 mg total) by mouth every 6 (six) hours as needed for nausea or vomiting. 03/23/18   Salary, Evelena Asa, MD  oxybutynin (DITROPAN) 5 MG tablet Take 15 mg by mouth 2 (two) times daily.    [provider]  potassium chloride (KLOR-CON) 20 MEQ packet Take 20 mEq by mouth daily. 03/24/18   Salary, Evelena Asa, MD  senna-docusate (SENOKOT-S) 8.6-50 MG tablet Take 2 tablets by mouth at bedtime. 03/23/18   Salary, Evelena Asa, MD  vitamin C (VITAMIN C) 500 MG tablet Take 1 tablet (500 mg total) by mouth 2 (two) times daily. Patient not taking: Reported on 03/18/2018 02/19/17   Enedina Finner, MD    Allergies Orange fruit [citrus]  Family History  Problem Relation Age of Onset  . Lymphoma Sister     Social History Social History   Tobacco Use  . Smoking status: Former Smoker    Packs/day: 0.50    Types: Cigarettes  . Smokeless tobacco: Never Used  Substance Use Topics  . Alcohol use: No  . Drug use: Yes    Types: Marijuana    Comment: 2 days ago    Review of Systems  Constitutional: No fever/chills Eyes: No visual changes. ENT: No sore throat. Cardiovascular: Denies chest pain. Respiratory: Denies shortness of breath. Gastrointestinal: No diarrhea.  No constipation. Genitourinary: Negative for dysuria. Musculoskeletal: Negative for back pain. Skin: Negative for rash. Neurological: Negative for headaches, focal weakness or numbness.   ____________________________________________   PHYSICAL EXAM:  VITAL SIGNS: ED Triage Vitals  Enc Vitals Group     BP 04/22/18 1517 118/77     Pulse Rate 04/22/18 1517 (!) 104     Resp 04/22/18 1517 16     Temp 04/22/18 1517 97.8 F (36.6 C)     Temp Source 04/22/18 1517 Oral     SpO2 04/22/18 1517 98 %     Weight 04/22/18 1519 110 lb (49.9 kg)     Height 04/22/18 1519 5\' 10"  (1.778 m)     Head Circumference --      Peak Flow --      Pain Score 04/22/18 1519 10     Pain Loc --      Pain Edu? --      Excl. in GC? --     Constitutional: Alert and oriented.  Appears uncomfortable Eyes: Conjunctivae are normal.  Head:  Atraumatic. Nose: No congestion/rhinnorhea. Mouth/Throat: Mucous membranes are moist.  Neck: No stridor.   Cardiovascular: Normal rate, regular rhythm. Grossly normal heart sounds.   Respiratory: Normal respiratory effort.  No retractions. Lungs CTAB. Gastrointestinal: Soft with moderate and generalized abdominal pain without any focal tenderness.  No rebound or guarding.No distention. No CVA tenderness.  Genitourinary: Foley catheter in place with yellow, cloudy urine present.  Musculoskeletal: No lower extremity tenderness nor edema.  No joint effusions. Neurologic:  Normal speech and language. No gross focal neurologic deficits are appreciated. Skin: Multiple sacral wounds and stage and stages 3 and 4.  There is a foul odor but tissue appears to be pink and without duskiness.  No bone visualized.  No exudate. Psychiatric: Mood and affect are normal. Speech and behavior are normal.  ____________________________________________   LABS (all labs ordered  are listed, but only abnormal results are displayed)  Labs Reviewed  COMPREHENSIVE METABOLIC PANEL - Abnormal; Notable for the following components:      Result Value   CO2 12 (*)    Glucose, Bld 185 (*)    Total Protein 9.0 (*)    Anion gap 18 (*)    All other components within normal limits  CBC - Abnormal; Notable for the following components:   WBC 17.6 (*)    RBC 6.01 (*)    Hemoglobin 10.6 (*)    MCV 67.2 (*)    MCH 17.6 (*)    MCHC 26.2 (*)    RDW 21.3 (*)    Platelets 791 (*)    All other components within normal limits  URINALYSIS, COMPLETE (UACMP) WITH MICROSCOPIC - Abnormal; Notable for the following components:   Color, Urine YELLOW (*)    APPearance CLOUDY (*)    Hgb urine dipstick SMALL (*)    Ketones, ur 80 (*)    Protein, ur 100 (*)    Leukocytes, UA SMALL (*)    Bacteria, UA RARE (*)    All other components within normal limits  CULTURE, BLOOD (ROUTINE X 2)  CULTURE, BLOOD (ROUTINE X 2)  LIPASE, BLOOD    LACTIC ACID, PLASMA  URINALYSIS, ROUTINE W REFLEX MICROSCOPIC  LACTIC ACID, PLASMA  I-STAT CG4 LACTIC ACID, ED  I-STAT CG4 LACTIC ACID, ED   ____________________________________________  EKG   ____________________________________________  RADIOLOGY  Chronic bilateral sacral decubitus ulcers.  Sclerotic appearance of the sacrum, as she and posterior walls of the acetabular are compatible with changes of chronic osteomyelitis. ____________________________________________   PROCEDURES  Procedure(s) performed:   Procedures  Critical Care performed:   ____________________________________________   INITIAL IMPRESSION / ASSESSMENT AND PLAN / ED COURSE  Pertinent labs & imaging results that were available during my care of the patient were reviewed by me and considered in my medical decision making (see chart for details).  Differential diagnosis includes, but is not limited to, biliary disease (biliary colic, acute cholecystitis, cholangitis, choledocholithiasis, etc), intrathoracic causes for epigastric abdominal pain including ACS, gastritis, duodenitis, pancreatitis, small bowel or large bowel obstruction, abdominal aortic aneurysm, hernia, and ulcer(s). Differential diagnosis includes, but is not limited to, acute appendicitis, renal colic, testicular torsion, urinary tract infection/pyelonephritis, prostatitis,  epididymitis, diverticulitis, small bowel obstruction or ileus, colitis, abdominal aortic aneurysm, gastroenteritis, hernia, etc. As part of my medical decision making, I reviewed the following data within the electronic MEDICAL RECORD NUMBER Notes from prior ED visits  ----------------------------------------- 7:27 PM on 04/22/2018 -----------------------------------------  Patient with white blood cell count of 17.  Continued abdominal pain but says that it is decreased after medication.  Said that the last time he was here the symptoms he had sepsis from osteomyelitis  of his foot.  Concerned because of increased white blood cell count and without any acute process on his abdomen that he could have a systemic infection causing the symptoms.  Likely infection/bacteremia from his sacral wounds.  Will be given broad-spectrum antibiotics and admitted to the hospital.  Signed out to Dr. Nancy MarusMayo.   ____________________________________________   FINAL CLINICAL IMPRESSION(S) / ED DIAGNOSES  Osteomyelitis.  Abdominal pain.  Nausea and vomiting.  NEW MEDICATIONS STARTED DURING THIS VISIT:  New Prescriptions   No medications on file     Note:  This document was prepared using Dragon voice recognition software and may include unintentional dictation errors.     Myrna BlazerSchaevitz, David Matthew, MD 04/22/18 [phone]

## 2018-04-22 NOTE — Progress Notes (Signed)
Pharmacy Antibiotic Note  Jonathon York is a 42 y.o. male admitted on 04/22/2018 with sepsis.  Pharmacy has been consulted for Vancomycin and Cefepime dosing.  Plan:   Vancomycin 1g IV given in ED.  Vancomycin 750 mg IV every 8 hours.  Goal trough 15-20 mcg/mL.  Trough level ordered for 04/24/18 at 04:30.  Cefepime 2g IV every 8 hours.  Height: 5\' 10"  (177.8 cm) Weight: 110 lb (49.9 kg) IBW/kg (Calculated) : 73  Temp (24hrs), Avg:97.8 F (36.6 C), Min:97.8 F (36.6 C), Max:97.8 F (36.6 C)  Recent Labs  Lab 04/22/18 1534 04/22/18 1829 04/22/18 2013  WBC 17.6*  --   --   CREATININE 0.68  --   --   LATICACIDVEN  --  1.0 0.58    Estimated Creatinine Clearance: 84.9 mL/min (by C-G formula based on SCr of 0.68 mg/dL).    Allergies  Allergen Reactions  . Orange Fruit [Citrus] Other (See Comments)    blisters    Antimicrobials this admission: Metronidazole 12/25 >>  Cefepime  12/25 >>  Vancomycin 12/25 >>  Dose adjustments this admission:   Microbiology results: 12/25 BCx: pending  Thank you for allowing pharmacy to be a part of this patient's care.  Jonathon York,Jonathon York, [facility] CenterRPH 04/22/2018 8:56 PM

## 2018-04-22 NOTE — ED Notes (Signed)
Pt has 3 deep & tunneled pressure wounds to sacrum. Drainage is brown. Site is pink/red. Painful. Cleansed and new dressings applied.

## 2018-04-22 NOTE — ED Notes (Signed)
Pt now agreeable to butterfly stick for 2nd set of cultures.

## 2018-04-22 NOTE — Progress Notes (Signed)
CODE SEPSIS - PHARMACY COMMUNICATION  **Broad Spectrum Antibiotics should be administered within 1 hour of Sepsis diagnosis**  Time Code Sepsis Called/Page Received: 18:11  Antibiotics Ordered: Metronidazole, Cefepime, Vancomycine  Time of 1st antibiotic administration: 19:44  Additional action taken by pharmacy:   If necessary, Name of Provider/Nurse Contacted:     Jonathon York,Jonathon York, [facility]LLCRPH Clinical Pharmacist  04/22/2018  7:49 PM

## 2018-04-22 NOTE — ED Notes (Signed)
EDP notified in person lab lactic taken in place of I-stat lactic as was unable to find correct tubing to collect.

## 2018-04-22 NOTE — H&P (Signed)
[facility] - [place] at [facility]   PATIENT NAME: Jonathon York    MR#:  [id number]  DATE OF BIRTH:  1975/10/30  DATE OF ADMISSION:  04/22/2018  PRIMARY CARE PHYSICIAN: [place]errilyn [place], [facility]   REQUESTING/REFERRING PHYSICIAN: Pershing ProudSchaevitz, MD  CHIEF COMPLAINT:   Chief Complaint  Patient presents with  . Abdominal Pain    HISTORY OF PRESENT ILLNESS:  Jonathon LyRichard Shappell  is a 42 y.o. male who presents with chief complaint as above.  Patient presents to the ED with malaise, nausea vomiting.  He states that he typically has these symptoms when he has developed some infection.  Work-up here initially is consistent with sepsis, with tachycardia and leukocytosis.  Patient has some chronic decubitus ulcers.  He also was recently operated on his right foot for osteomyelitis.  Hospitalist called for admission  PAST MEDICAL HISTORY:   Past Medical History:  Diagnosis Date  . Anxiety   . Decubitus ulcer   . Depression   . Neurogenic bladder   . Osteomyelitis (HCC)   . 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 AMPUTATION Right 03/19/2018   Procedure: 5th Metatarsal Resection;  Surgeon: Recardo Evangelistroxler, Matthew, DPM;  Location: [facility] ORS;  Service: Podiatry;  Laterality: Right;  . AMPUTATION TOE Right 04/29/2015   Procedure: AMPUTATION TOE;  Surgeon: Linus Galasodd Cline, MD;  Location: [facility] ORS;  Service: Podiatry;  Laterality: Right;  . IRRIGATION AND DEBRIDEMENT BUTTOCKS    . TOE AMPUTATION Right      SOCIAL HISTORY:   Social History   Tobacco Use  . Smoking status: Former Smoker    Packs/day: 0.50    Types: Cigarettes  . Smokeless tobacco: Never Used  Substance Use Topics  . Alcohol use: No     FAMILY HISTORY:   Family History  Problem Relation Age of Onset  . Lymphoma Sister      DRUG ALLERGIES:   Allergies  Allergen Reactions  . Orange Fruit [Citrus] Other  (See Comments)    blisters    MEDICATIONS AT HOME:   Prior to Admission medications   Medication Sig Start Date End Date Taking? Authorizing Provider  Multiple Vitamin (MULTIVITAMIN WITH MINERALS) TABS tablet Take 1 tablet by mouth daily. 11/22/16  Yes Altamese DillingVachhani, Vaibhavkumar, MD  nutrition supplement, JUVEN, (JUVEN) PACK Take 1 packet by mouth 2 (two) times daily between meals. Patient not taking: Reported on 04/22/2018 03/23/18   Salary, Jetty DuhamelMontell D, MD  potassium chloride (KLOR-CON) 20 MEQ packet Take 20 mEq by mouth daily. Patient not taking: Reported on 04/22/2018 03/24/18   Salary, Evelena AsaMontell D, MD    REVIEW OF SYSTEMS:  Review of Systems  Constitutional: Positive for malaise/fatigue. Negative for chills, fever and weight loss.  HENT: Negative for ear pain, hearing loss and tinnitus.   Eyes: Negative for blurred vision, double vision, pain and redness.  Respiratory: Negative for cough, hemoptysis and shortness of breath.   Cardiovascular: Negative for chest pain, palpitations, orthopnea and leg swelling.  Gastrointestinal: Positive for nausea and vomiting. Negative for abdominal pain, constipation and diarrhea.  Genitourinary: Negative for dysuria, frequency and hematuria.  Musculoskeletal: Negative for back pain, joint pain and neck pain.  Skin:       Chronic decubitus ulcers  Neurological: Negative for dizziness, tremors, focal weakness and weakness.  Endo/Heme/Allergies: Negative for polydipsia. Does not bruise/bleed easily.  Psychiatric/Behavioral: Negative for depression. The patient is not  nervous/anxious and does not have insomnia.      VITAL SIGNS:   Vitals:   04/22/18 1856 04/22/18 1900 04/22/18 2139 04/22/18 2140  BP: 114/66 109/63 102/62   Pulse: 88 80 83 76  Resp:      Temp:      TempSrc:      SpO2: 98% 100% 100% 100%  Weight:      Height:       Wt Readings from Last 3 Encounters:  04/22/18 49.9 kg  03/18/18 52.2 kg  12/25/17 45.4 kg    PHYSICAL  EXAMINATION:  Physical Exam  Vitals reviewed. Constitutional: He is oriented to person, place, and time. He appears well-developed and well-nourished. No distress.  HENT:  Head: Normocephalic and atraumatic.  Mouth/Throat: Oropharynx is clear and moist.  Eyes: Pupils are equal, round, and reactive to light. Conjunctivae and EOM are normal. No scleral icterus.  Neck: Normal range of motion. Neck supple. No JVD present. No thyromegaly present.  Cardiovascular: Normal rate, regular rhythm and intact distal pulses. Exam reveals no gallop and no friction rub.  No murmur heard. Respiratory: Effort normal and breath sounds normal. No respiratory distress. He has no wheezes. He has no rales.  GI: Soft. Bowel sounds are normal. He exhibits no distension. There is no abdominal tenderness.  Musculoskeletal: Normal range of motion.        General: No edema.     Comments: No arthritis, no gout  Lymphadenopathy:    He has no cervical adenopathy.  Neurological: He is alert and oriented to person, place, and time. No cranial nerve deficit.  No dysarthria, no aphasia  Skin: Skin is warm and dry. No rash noted. No erythema.  Chronic decubitus ulcers, dry well healing incision on the lateral aspect of his right foot  Psychiatric: He has a normal mood and affect. His behavior is normal. Judgment and thought content normal.    LABORATORY PANEL:   CBC Recent Labs  Lab 04/22/18 1534  WBC 17.6*  HGB 10.6*  HCT 40.4  PLT 791*   ------------------------------------------------------------------------------------------------------------------  Chemistries  Recent Labs  Lab 04/22/18 1534  NA 137  K 4.4  CL 107  CO2 12*  GLUCOSE 185*  BUN 13  CREATININE 0.68  CALCIUM 9.1  AST 15  ALT 11  ALKPHOS 91  BILITOT 1.1   ------------------------------------------------------------------------------------------------------------------  Cardiac Enzymes No results for input(s): TROPONINI in the  last 168 hours. ------------------------------------------------------------------------------------------------------------------  RADIOLOGY:  Ct Abdomen Pelvis W Contrast  Result Date: 04/22/2018 CLINICAL DATA:  Paraplegic with Foley catheter in place. Status post recent foot surgery due to infection. Patient presents with generalized abdominal pain for 2 days blood tinged emesis. EXAM: CT ABDOMEN AND PELVIS WITH CONTRAST TECHNIQUE: Multidetector CT imaging of the abdomen and pelvis was performed using the standard protocol following bolus administration of intravenous contrast. CONTRAST:  75mL OMNIPAQUE IOHEXOL 300 MG/ML  SOLN COMPARISON:  03/18/2018 FINDINGS: Lower chest: No acute abnormality. Hepatobiliary: Tiny 4 mm hypodensity in the right hepatic lobe too small to further characterize but statistically consistent with a tiny cyst or hemangioma. The gallbladder is unremarkable. Pancreas: Normal Spleen: Normal Adrenals/Urinary Tract: Stable bilateral adrenal glands. Symmetric cortical enhancement of both kidneys with left-sided interpolar renal cysts unchanged in appearance. Delayed repeat imaging demonstrates symmetric pyelograms both kidneys. The urinary bladder is thick-walled in appearance are partially decompressed Foley catheter as before. Chronic cystitis might for this appearance in addition to underdistention Stomach/Bowel: Stomach is within normal limits. Appendix appears normal. No evidence  of bowel wall thickening, distention, or inflammatory changes. Vascular/Lymphatic: Aortic atherosclerosis. No enlarged abdominal or pelvic lymph nodes. Reproductive: Normal size prostate. Other: Chronic bilateral sacral decubitus ulcers. Musculoskeletal: Chronic sclerotic appearance of the posterior walls of the acetabula, ischii, sacrum and left iliac bones compatible changes of chronic osteomyelitis. Sclerosis of the sacrum is also noted SP no acute osseous appearing abnormality. Partially included upper  spinal fusion hardware appear intact. Fatty atrophy of the erector spinae muscles. IMPRESSION: 1. Chronic bilateral sacral decubitus ulcers. Sclerotic appearance of the sacrum, ischia and posterior walls of the acetabula are compatible with changes of chronic osteomyelitis. 2. Thick-walled appearance of the urinary bladder which may be due to underdistention. Chronic cystitis is not excluded. 3. No acute bowel inflammation or obstruction. Aortic Atherosclerosis (ICD10-I70.0). Electronically Signed   By: Tollie Eth M.D.   On: 04/22/2018 18:48    EKG:   Orders placed or performed during the hospital encounter of 04/22/18  . ED EKG 12-Lead  . ED EKG 12-Lead    IMPRESSION AND PLAN:  Principal Problem:   Sepsis (HCC) -likely related to infection of his decubitus ulcer.  However, he had a recent procedure done for osteomyelitis of his right foot.  The wound appears to be healing well and does not have any surrounding erythema or drainage.  However, we will x-ray that foot to evaluate for any possible changes.  We have also started IV antibiotics.  His lactic acid was within normal limits, his blood pressure is stable.  Cultures sent Active Problems:   Decubitus ulcer of sacral region -wound team consult, IV antibiotics as above   Paraplegia (HCC) -sepsis stage for his decubitus ulcers as above.  Patient states that he has not been to any outpatient wound treatment as he is not able to get a ride.  We will get a social work case management consult   Anxiety -not on chronic meds for this, treat PRN  Chart review performed and case discussed with ED provider. Labs, imaging and/or ECG reviewed by provider and discussed with patient/family. Management plans discussed with the patient and/or family.  DVT PROPHYLAXIS: SubQ lovenox   GI PROPHYLAXIS:  None  ADMISSION STATUS: Inpatient     CODE STATUS: Full Code Status History    Date Active Date Inactive Code Status Order ID Comments User Context    03/18/2018 1119 03/23/2018 1659 Full Code [id number]  Houston Siren, MD ED   02/14/2017 2321 02/19/2017 2046 Full Code [id number]  Oralia Manis, MD Inpatient   11/19/2016 1720 11/21/2016 2207 Full Code [id number]  Auburn Bilberry, MD Inpatient   04/15/2016 1802 04/19/2016 0211 Full Code [id number]  Shaune Pollack, MD Inpatient   04/29/2015 1017 04/30/2015 1339 Full Code [id number]  Linus Galas, MD Inpatient   04/28/2015 0438 04/29/2015 1017 Full Code [id number]  Milagros Loll, MD ED      TOTAL TIME TAKING CARE OF THIS PATIENT: 45 minutes.   Serita Degroote FIELDING 04/22/2018, 9:49 PM  [facility]  Office  [phone]  CC: Primary care physician; [facility]  Note:  This document was prepared using Dragon voice recognition software and may include unintentional dictation errors.

## 2018-04-22 NOTE — ED Notes (Signed)
Pt given ginger ale.

## 2018-04-22 NOTE — ED Notes (Signed)
Pt given warm blankets.

## 2018-04-22 NOTE — ED Triage Notes (Signed)
Pt to ED reporting generalized abd pain x 2 days with pink blood tinged vomit. Pt denies having been able to eat for the past two days due to the pain and the nausea.   4mg  of Zofran given by EMS. PT also reporting sores on his butt that he has had for the past 7 years. Pt is pale upon arrival.

## 2018-04-22 NOTE — ED Notes (Signed)
Stuck twice with butterfly. 2nd blue culture sent to lab. Unable to collect enough blood for 2nd red culture.

## 2018-04-22 NOTE — ED Notes (Signed)
Pt leaving for CT.  

## 2018-04-23 DIAGNOSIS — R1084 Generalized abdominal pain: Secondary | ICD-10-CM | POA: Diagnosis not present

## 2018-04-23 LAB — MRSA PCR SCREENING: MRSA by PCR: NEGATIVE

## 2018-04-23 LAB — CBC
HCT: 31.6 % — ABNORMAL LOW (ref 39.0–52.0)
Hemoglobin: 8.3 g/dL — ABNORMAL LOW (ref 13.0–17.0)
MCH: 17.7 pg — ABNORMAL LOW (ref 26.0–34.0)
MCHC: 26.3 g/dL — ABNORMAL LOW (ref 30.0–36.0)
MCV: 67.2 fL — ABNORMAL LOW (ref 80.0–100.0)
Platelets: 539 10*3/uL — ABNORMAL HIGH (ref 150–400)
RBC: 4.7 MIL/uL (ref 4.22–5.81)
RDW: 20.7 % — ABNORMAL HIGH (ref 11.5–15.5)
WBC: 10.7 10*3/uL — ABNORMAL HIGH (ref 4.0–10.5)
nRBC: 0 % (ref 0.0–0.2)

## 2018-04-23 LAB — BASIC METABOLIC PANEL
Anion gap: 10 (ref 5–15)
BUN: 6 mg/dL (ref 6–20)
CO2: 15 mmol/L — ABNORMAL LOW (ref 22–32)
Calcium: 8.5 mg/dL — ABNORMAL LOW (ref 8.9–10.3)
Chloride: 114 mmol/L — ABNORMAL HIGH (ref 98–111)
Creatinine, Ser: 0.48 mg/dL — ABNORMAL LOW (ref 0.61–1.24)
GFR calc Af Amer: >60 mL/min (ref >60)
GFR calc non Af Amer: >60 mL/min (ref >60)
GLUCOSE: 99 mg/dL (ref 70–99)
Potassium: 3.9 mmol/L (ref 3.5–5.1)
Sodium: 139 mmol/L (ref 135–145)

## 2018-04-23 MED ORDER — PANTOPRAZOLE SODIUM 40 MG PO TBEC: 40.0000 mg | DELAYED_RELEASE_TABLET | Freq: Every day | ORAL | Status: DC

## 2018-04-23 MED ORDER — MORPHINE SULFATE (PF) 2 MG/ML IV SOLN
2.0000 mg | INTRAVENOUS | Status: DC | PRN
  Administered 2018-04-23: 2 mg via INTRAVENOUS
  Filled 2018-04-23: qty 1

## 2018-04-23 MED ORDER — TRAMADOL HCL 50 MG PO TABS: 50.0000 mg | ORAL_TABLET | Freq: Four times a day (QID) | ORAL | Status: DC | PRN

## 2018-04-23 MED ORDER — PANTOPRAZOLE SODIUM 40 MG PO TBEC: 40.0000 mg | DELAYED_RELEASE_TABLET | Freq: Every day | ORAL | 0 refills | Status: DC

## 2018-04-23 MED ORDER — ENOXAPARIN SODIUM 30 MG/0.3ML ~~LOC~~ SOLN: 30.0000 mg | SUBCUTANEOUS | Status: DC
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
## 2018-04-23 NOTE — Plan of Care (Signed)

## 2018-04-23 NOTE — Progress Notes (Signed)
Discharge instructions given to pt. IV removed. Pt anxious and ready to leave. Daughter at bedside and will transport pt home.

## 2018-04-23 NOTE — Care Management Obs Status (Signed)
MEDICARE OBSERVATION STATUS NOTIFICATION   Patient Details  Name: Winfred LeedsRichard L Hole MRN: [id number] Date of Birth: [birth year]   Medicare Observation Status Notification Given:  Yes    Collie Siadngela Rashauna Tep, RN 04/23/2018, 12:24 PM

## 2018-04-23 NOTE — Discharge Summary (Signed)
[facility]   PATIENT NAME: Jonathon York    MR#:  [id number]  DATE OF BIRTH:  1975/11/04  DATE OF ADMISSION:  04/22/2018 ADMITTING PHYSICIAN: Oralia Manis, MD  DATE OF DISCHARGE: 04/23/2018  PRIMARY CARE PHYSICIAN: [place], [facility]    ADMISSION DIAGNOSIS:  Osteomyelitis (HCC) [M86.9] Generalized abdominal pain [R10.84] Osteomyelitis, unspecified site, unspecified type (HCC) [M86.9]  DISCHARGE DIAGNOSIS:  abdominal pain with episode of vomiting suspected due to gastroenteritis/gastritis resolved leukocytosis improved. Chronic severe decubitus ulcers--- does not appear infected  SECONDARY DIAGNOSIS:   Past Medical History:  Diagnosis Date  . Anxiety   . Decubitus ulcer   . Depression   . Neurogenic bladder   . Osteomyelitis (HCC)   . Paraparesis of both lower limbs [facility]) 02/21/00    HOSPITAL COURSE:   Jonathon York  is a 42 y.o. male who presents with chief complaint as above.  Patient presents to the ED with malaise, nausea vomiting.   Work-up here initially is consistent with sepsis, with tachycardia and leukocytosis.  Patient has some chronic decubitus ulcers  1. Abdominal pain an episode of vomiting with some coffee ground emesis suspected due to acute gastritis/gastroenteritis. -No more vomiting.  -Patient tolerated regular diet. -Received IV fluids  2. Suspected sepsis--- no source of infection. Patient has chronic decubitus ulcers which do not appear infected. Sepsis ruled out -discontinue antibiotics  3. 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 According to her eval wound appears not infected. -Continue current dressing changes  4. Leukocytosis appears reactive  5. Paraplegia functional, chronic Foley, chronic severe decubitus  Patient overall hemodynamically stable. Discharge  to home. Patient agreeable. CONSULTS OBTAINED:    DRUG ALLERGIES:   Allergies  Allergen Reactions  . Orange Fruit [Citrus] Other (See Comments)    blisters    DISCHARGE MEDICATIONS:   Allergies as of 04/23/2018      Reactions   Orange Fruit [citrus] Other (See Comments)   blisters      Medication List    STOP taking these medications   nutrition supplement (JUVEN) Pack   potassium chloride 20 MEQ packet Commonly known as:  KLOR-CON     TAKE these medications   multivitamin with minerals Tabs tablet Take 1 tablet by mouth daily.   pantoprazole 40 MG tablet Commonly known as:  PROTONIX Take 1 tablet (40 mg total) by mouth daily.       If you experience worsening of your admission symptoms, develop shortness of breath, life threatening emergency, suicidal or homicidal thoughts you must seek medical attention immediately by calling 911 or calling your MD immediately  if symptoms less severe.  You Must read complete instructions/literature along with all the possible adverse reactions/side effects for all the Medicines you take and that have been prescribed to you. Take any new Medicines after you have completely understood and accept all the possible adverse reactions/side effects.   Please note  You were cared for by a hospitalist during your hospital stay. If you have any questions about your discharge medications or the care you received while you were in the hospital after you are discharged, you can call the unit and asked to speak with the hospitalist on call if the hospitalist that took care of you is not available. Once you are discharged, your primary care physician will handle any further medical issues.  Please note that NO REFILLS for any discharge medications will be authorized once you are discharged, as it is imperative that you return to your primary care physician (or establish a relationship with a primary care physician if you do not have one) for your  aftercare needs so that they can reassess your need for medications and monitor your lab values. Today   SUBJECTIVE   Came in after having an episode of vomiting. Currently hungry wants to eat food  VITAL SIGNS:  Blood pressure (!) 105/58, pulse 80, temperature 98 F (36.7 C), temperature source Oral, resp. rate 20, height 5\' 10"  (1.778 m), weight 43.7 kg, SpO2 100 %.  I/O:    Intake/Output Summary (Last 24 hours) at 04/23/2018 1311 Last data filed at 04/23/2018 0553 Gross per 24 hour  Intake 990.02 ml  Output -  Net 990.02 ml    PHYSICAL EXAMINATION:  GENERAL:  42 y.o.-year-old patient lying in the bed with no acute distress. Thin cachectic malnourished EYES: Pupils equal, round, reactive to light and accommodation. No scleral icterus. Extraocular muscles intact.  HEENT: Head atraumatic, normocephalic. Oropharynx and nasopharynx clear.  NECK:  Supple, no jugular venous distention. No thyroid enlargement, no tenderness.  LUNGS: Normal breath sounds bilaterally, no wheezing, rales,rhonchi or crepitation. No use of accessory muscles of respiration.  CARDIOVASCULAR: S1, S2 normal. No murmurs, rubs, or gallops.  ABDOMEN: Soft, non-tender, non-distended. Bowel sounds present. No organomegaly or mass. Chronic Foley catheter EXTREMITIES: No pedal edema, cyanosis, or clubbing.  NEUROLOGIC: Cranial nerves II through XII are intact. Functional paraplegia.  Bilateral lower extremity atrophy secondary to paraplegia PSYCHIATRIC: The patient is alert and oriented x 3.  SKIN: Sacral and bilateral ischial wounds are clean and pink. Drainage (amount, consistency, odor) Minimal on existing ABD pads. Periwound: Intact, normal color and texture  DATA REVIEW:   CBC  Recent Labs  Lab 04/23/18 0355  WBC 10.7*  HGB 8.3*  HCT 31.6*  PLT 539*    Chemistries  Recent Labs  Lab 04/22/18 1534 04/23/18 0355  NA 137 139  K 4.4 3.9  CL 107 114*  CO2 12* 15*  GLUCOSE 185* 99  BUN 13 6   CREATININE 0.68 0.48*  CALCIUM 9.1 8.5*  AST 15  --   ALT 11  --   ALKPHOS 91  --   BILITOT 1.1  --     Microbiology Results   Recent Results (from the past 240 hour(s))  Blood Culture (routine x 2)     Status: None (Preliminary result)   Collection Time: 04/22/18  6:19 PM  Result Value Ref Range Status   Specimen Description BLOOD LEFT ANTECUBITAL  Final   Special Requests   Final    BOTTLES DRAWN AEROBIC AND ANAEROBIC Blood Culture adequate volume   Culture   Final    NO GROWTH < 12 HOURS Performed at [facility], [address] [place], [place] [address]    Report Status PENDING  Incomplete  Blood Culture (routine x 2)     Status: None (Preliminary result)   Collection Time: 04/22/18  7:32 PM  Result Value Ref Range Status   Specimen Description BLOOD BLOOD RIGHT ARM  Final   Special Requests   Final    BOTTLES DRAWN AEROBIC ONLY Blood Culture adequate volume   Culture   Final    NO GROWTH < 12 HOURS Performed at [facility], [address] [place], [place] [address]    Report Status PENDING  Incomplete  MRSA PCR Screening     Status: None   Collection Time: 04/23/18 10:53 AM  Result Value Ref Range Status   MRSA by PCR NEGATIVE NEGATIVE Final    Comment:        The GeneXpert MRSA Assay (FDA approved for NASAL specimens only), is one component of a comprehensive MRSA colonization surveillance program. It is not intended to diagnose MRSA infection nor to guide or monitor treatment for MRSA infections. Performed at [facility], [address] [place], [place] [address]     RADIOLOGY:  Ct Abdomen Pelvis W Contrast  Result Date: 04/22/2018 CLINICAL DATA:  Paraplegic with Foley catheter in place. Status post recent foot surgery due to infection. Patient presents with generalized abdominal pain for 2 days blood tinged emesis. EXAM: CT ABDOMEN AND PELVIS WITH CONTRAST TECHNIQUE: Multidetector CT imaging of the abdomen and pelvis  was performed using the standard protocol following bolus administration of intravenous contrast. CONTRAST:  75mL OMNIPAQUE IOHEXOL 300 MG/ML  SOLN COMPARISON:  03/18/2018 FINDINGS: Lower chest: No acute abnormality. Hepatobiliary: Tiny 4 mm hypodensity in the right hepatic lobe too small to further characterize but statistically consistent with a tiny cyst or hemangioma. The gallbladder is unremarkable. Pancreas: Normal Spleen: Normal Adrenals/Urinary Tract: Stable bilateral adrenal glands. Symmetric cortical enhancement of both kidneys with left-sided interpolar renal cysts unchanged in appearance. Delayed repeat imaging demonstrates symmetric pyelograms both kidneys. The urinary bladder is thick-walled in appearance are partially decompressed Foley catheter as before. Chronic cystitis might for this appearance in addition to underdistention Stomach/Bowel: Stomach is within normal limits. Appendix appears normal. No evidence of bowel wall thickening, distention, or inflammatory changes. Vascular/Lymphatic: Aortic atherosclerosis. No enlarged abdominal or pelvic lymph nodes. Reproductive: Normal size prostate. Other: Chronic bilateral sacral decubitus ulcers. Musculoskeletal: Chronic sclerotic appearance of the posterior walls of the acetabula, ischii, sacrum and left iliac bones compatible changes of chronic osteomyelitis. Sclerosis of the sacrum is also noted SP no acute osseous appearing abnormality. Partially included upper spinal fusion hardware appear intact. Fatty atrophy of the erector spinae muscles. IMPRESSION: 1. Chronic bilateral sacral decubitus ulcers. Sclerotic appearance of the sacrum, ischia and posterior walls of the acetabula are compatible with changes of chronic osteomyelitis. 2. Thick-walled appearance of the urinary bladder which may be due to underdistention. Chronic cystitis is not excluded. 3. No acute bowel inflammation or obstruction. Aortic Atherosclerosis (ICD10-I70.0). Electronically  Signed   By: Tollie Eth M.D.   On: 04/22/2018 18:48   Dg Foot 2 Views Right  Result Date: 04/22/2018 CLINICAL DATA:  Osteomyelitis. EXAM: RIGHT FOOT - 2 VIEW COMPARISON:  MRI of the RIGHT foot March 18, 2018 and RIGHT foot radiograph March 18, 2018. FINDINGS: Interval fifth transmetatarsal amputation. 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 Flexed first through third toe seen with hammertoe deformities or contractures. Osteopenia without destructive bony lesions. Soft tissue planes are non suspicious. IMPRESSION: 1. Osteopenia.  No destructive bony lesions. 2. Status post fifth transmetatarsal and fourth toe amputations. Electronically Signed   By: Awilda Metro M.D.   On: 04/22/2018 22:57     CODE STATUS:     Code Status Orders  (From admission, onward)         Start     Ordered   04/22/18 2309  Full code  Continuous     04/22/18 2308        Code Status History    Date Active Date Inactive Code Status Order ID Comments User Context   03/18/2018 1119 03/23/2018 1659 Full  Code [id number]  [place] SirenSainani, Vivek J, MD ED   02/14/2017 2321 02/19/2017 2046 Full Code [id number]  Oralia ManisWillis, David, MD Inpatient   11/19/2016 1720 11/21/2016 2207 Full Code [id number]  Auburn BilberryPatel, Shreyang, MD Inpatient   04/15/2016 1802 04/19/2016 0211 Full Code [id number]  Shaune Pollackhen, Qing, MD Inpatient   04/29/2015 1017 04/30/2015 1339 Full Code [id number]  Linus Galasline, Todd, MD Inpatient   04/28/2015 0438 04/29/2015 1017 Full Code [id number]  Milagros LollSudini, Srikar, MD ED      TOTAL TIME TAKING CARE OF THIS PATIENT: *40* minutes.    Enedina FinnerSona Keisean Skowron M.D on 04/23/2018 at 1:11 PM  Between 7am to 6pm - Pager - [phone] After 6pm go to [email or web address] - Social research officer, [email or web address] [email or web address]  [facility]  Office  [phone]  CC: Primary care physician; [place], [facility]

## 2018-04-23 NOTE — Progress Notes (Signed)
PHARMACIST - PHYSICIAN COMMUNICATION  CONCERNING:  Enoxaparin (Lovenox) for DVT Prophylaxis    RECOMMENDATION: Patient was prescribed enoxaprin 40mg  q24 hours for VTE prophylaxis.   Filed Weights   04/22/18 1519 04/22/18 2326  Weight: 110 lb (49.9 kg) 96 lb 6.4 oz (43.7 kg)    Body mass index is 13.83 kg/m.  Estimated Creatinine Clearance: 74.4 mL/min (A) (by C-G formula based on SCr of 0.48 mg/dL (L)).   [facility] patient is candidate for enoxaparin 30 mg every 24 hour dosing due to ABW <57 kg.  DESCRIPTION: Pharmacy has [facility]aparin [facility].  Patient is now receiving enoxaparin 30mg  every 24 hours.  Carola FrostNathan A Cheryll Keisler, PharmD Clinical Pharmacist  04/23/2018 9:28 AM

## 2018-04-23 NOTE — Progress Notes (Addendum)
Pt asking for pain medication. Pt educated MD has stopped IV pain medication and provided ultram PRN. Pt states "I don't want that shit. Who's changing up the plan". Pt educated that Dr Allena KatzPatel who was in his room earlier is his doctor today. Pt states "She's not my doctor. This is crap. I ain't taking tylenol". Pt educated that ultram is stronger than tylenol. Pt states "That's not true. This is bullshit". Pt educated that language is not appropriate. No response from pt.

## 2018-04-23 NOTE — Care Management CC44 (Signed)
Condition Code 44 Documentation Completed  Patient Details  Name: Jonathon York MRN: [id number] Date of Birth: 09-28-1975   Condition Code 44 given:  Yes Patient signature on Condition Code 44 notice:  Yes Documentation of 2 MD's agreement:  Yes Code 44 added to claim:  Yes    Collie SiadAngela Mikayah Joy, RN 04/23/2018, 12:24 PM

## 2018-04-23 NOTE — Care Management (Signed)
Dr. Allena KatzPatel agrees to code 9044- UR RN updated.

## 2018-04-23 NOTE — Consult Note (Signed)
WOC Nurse wound consult note [facility]  No family present.  Dr. Allena KatzPatel in room during time of my assessment. Reason for Consult: Sacral wounds Wound type: Chronic, non-healing Stage 4 PIs consistent with osteomyelitis per CT results from 04/22/18. Pressure Injury POA: Yes Measurements: Documented on Flowsheet. Wound bed: Sacral and bilateral ischial wounds are clean and pink. Drainage (amount, consistency, odor) Minimal on existing ABD pads. Periwound: Intact, normal color and texture. Dressing procedure/placement/frequency: Apply Xeroform gauze [place] # [id number]) into all open wound beds on the sacrum, bilateral ischium.  Cover with ABD pads, tape in place. Change daily. Additional care item:  Low air loss mattress. Monitor the wound area(s) for worsening of condition such as: Signs/symptoms of infection,  Increase in size,  Development of or worsening of odor, Development of pain, or increased pain at the affected locations.  Notify the medical team if any of these develop.  Thank you for the consult.  Discussed plan of care with the patient and bedside nurse.  WOC nurse will not follow at this time.  Please re-consult the WOC team if needed.  Helmut MusterSherry Aynslee Mulhall, RN, MSN, CWOCN, CNS-BC, pager [phone]

## 2018-04-24 NOTE — Care Management (Signed)
Post discharge note entry 04/24/18- RNCM notified by Barbara CowerJason with [facility] that patient refused home health start of care.

## 2018-04-27 LAB — CULTURE, BLOOD (ROUTINE X 2)
CULTURE: NO GROWTH
Culture: NO GROWTH
Special Requests: ADEQUATE
Special Requests: ADEQUATE

## 2018-11-25 ENCOUNTER — Other Ambulatory Visit: Payer: Self-pay

## 2018-11-25 ENCOUNTER — Encounter: Payer: Self-pay | Admitting: *Deleted

## 2018-11-25 DIAGNOSIS — G8220 Paraplegia, unspecified: Secondary | ICD-10-CM | POA: Diagnosis present

## 2018-11-25 DIAGNOSIS — Z807 Family history of other malignant neoplasms of lymphoid, hematopoietic and related tissues: Secondary | ICD-10-CM

## 2018-11-25 DIAGNOSIS — N368 Other specified disorders of urethra: Secondary | ICD-10-CM | POA: Diagnosis present

## 2018-11-25 DIAGNOSIS — D638 Anemia in other chronic diseases classified elsewhere: Secondary | ICD-10-CM | POA: Diagnosis present

## 2018-11-25 DIAGNOSIS — D509 Iron deficiency anemia, unspecified: Secondary | ICD-10-CM | POA: Diagnosis present

## 2018-11-25 DIAGNOSIS — N3000 Acute cystitis without hematuria: Secondary | ICD-10-CM | POA: Diagnosis present

## 2018-11-25 DIAGNOSIS — M4628 Osteomyelitis of vertebra, sacral and sacrococcygeal region: Secondary | ICD-10-CM | POA: Diagnosis present

## 2018-11-25 DIAGNOSIS — L89313 Pressure ulcer of right buttock, stage 3: Secondary | ICD-10-CM | POA: Diagnosis present

## 2018-11-25 DIAGNOSIS — Z87891 Personal history of nicotine dependence: Secondary | ICD-10-CM

## 2018-11-25 DIAGNOSIS — T83518A Infection and inflammatory reaction due to other urinary catheter, initial encounter: Principal | ICD-10-CM | POA: Diagnosis present

## 2018-11-25 DIAGNOSIS — N319 Neuromuscular dysfunction of bladder, unspecified: Secondary | ICD-10-CM | POA: Diagnosis present

## 2018-11-25 DIAGNOSIS — Z89421 Acquired absence of other right toe(s): Secondary | ICD-10-CM

## 2018-11-25 DIAGNOSIS — Z20828 Contact with and (suspected) exposure to other viral communicable diseases: Secondary | ICD-10-CM | POA: Diagnosis present

## 2018-11-25 DIAGNOSIS — R52 Pain, unspecified: Secondary | ICD-10-CM | POA: Diagnosis not present

## 2018-11-25 DIAGNOSIS — L89154 Pressure ulcer of sacral region, stage 4: Secondary | ICD-10-CM | POA: Diagnosis present

## 2018-11-25 DIAGNOSIS — B9620 Unspecified Escherichia coli [E. coli] as the cause of diseases classified elsewhere: Secondary | ICD-10-CM | POA: Diagnosis present

## 2018-11-25 DIAGNOSIS — Y846 Urinary catheterization as the cause of abnormal reaction of the patient, or of later complication, without mention of misadventure at the time of the procedure: Secondary | ICD-10-CM | POA: Diagnosis present

## 2018-11-25 LAB — CBC WITH DIFFERENTIAL/PLATELET
Abs Immature Granulocytes: 0.03 10*3/uL (ref 0.00–0.07)
Basophils Absolute: 0.1 10*3/uL (ref 0.0–0.1)
Basophils Relative: 1 %
Eosinophils Absolute: 0.1 10*3/uL (ref 0.0–0.5)
Eosinophils Relative: 1 %
HCT: 32.4 % — ABNORMAL LOW (ref 39.0–52.0)
Hemoglobin: 8.4 g/dL — ABNORMAL LOW (ref 13.0–17.0)
Immature Granulocytes: 0 %
Lymphocytes Relative: 24 %
Lymphs Abs: 1.7 10*3/uL (ref 0.7–4.0)
MCH: 15.7 pg — ABNORMAL LOW (ref 26.0–34.0)
MCHC: 25.9 g/dL — ABNORMAL LOW (ref 30.0–36.0)
MCV: 60.7 fL — ABNORMAL LOW (ref 80.0–100.0)
Monocytes Absolute: 0.3 10*3/uL (ref 0.1–1.0)
Monocytes Relative: 4 %
Neutro Abs: 4.9 10*3/uL (ref 1.7–7.7)
Neutrophils Relative %: 70 %
Platelets: 587 10*3/uL — ABNORMAL HIGH (ref 150–400)
RBC: 5.34 MIL/uL (ref 4.22–5.81)
RDW: 21.7 % — ABNORMAL HIGH (ref 11.5–15.5)
WBC: 7.1 10*3/uL (ref 4.0–10.5)
nRBC: 0 % (ref 0.0–0.2)

## 2018-11-25 LAB — COMPREHENSIVE METABOLIC PANEL
ALT: 8 U/L (ref 0–44)
AST: 11 U/L — ABNORMAL LOW (ref 15–41)
Albumin: 3.3 g/dL — ABNORMAL LOW (ref 3.5–5.0)
Alkaline Phosphatase: 78 U/L (ref 38–126)
Anion gap: 9 (ref 5–15)
BUN: 8 mg/dL (ref 6–20)
CO2: 25 mmol/L (ref 22–32)
Calcium: 8.8 mg/dL — ABNORMAL LOW (ref 8.9–10.3)
Chloride: 105 mmol/L (ref 98–111)
Creatinine, Ser: 0.46 mg/dL — ABNORMAL LOW (ref 0.61–1.24)
GFR calc Af Amer: >60 mL/min (ref >60)
GFR calc non Af Amer: >60 mL/min (ref >60)
Glucose, Bld: 112 mg/dL — ABNORMAL HIGH (ref 70–99)
Potassium: 3.6 mmol/L (ref 3.5–5.1)
Sodium: 139 mmol/L (ref 135–145)
Total Bilirubin: 0.4 mg/dL (ref 0.3–1.2)
Total Protein: 8.2 g/dL — ABNORMAL HIGH (ref 6.5–8.1)

## 2018-11-25 LAB — LACTIC ACID, PLASMA: Lactic Acid, Venous: 1 mmol/L (ref 0.5–1.9)

## 2018-11-25 NOTE — ED Triage Notes (Addendum)
Pt to ED with worsening sacral wounds. Pt reports they have started weeping more and more and the odor is similar to urine. Pt has also been self-cathing and reports fears that he has punctured his bladder. Decreased urine output with catheters and pt has noted increased on pts wheelchair cushion. Pt has not been seen by wound care in over a year.   Pt reports his sacral wounds have turned septic multiple times in the past and pt is starting to feel weaker.

## 2018-11-26 ENCOUNTER — Inpatient Hospital Stay
Admission: EM | Admit: 2018-11-26 | Discharge: 2018-11-30 | DRG: 698 | Disposition: A | Discharge status: Home Health | Payer: Medicare Other | Attending: Internal Medicine | Admitting: Internal Medicine

## 2018-11-26 ENCOUNTER — Emergency Department: Payer: Medicare Other

## 2018-11-26 DIAGNOSIS — Z960 Presence of urogenital implants: Secondary | ICD-10-CM

## 2018-11-26 DIAGNOSIS — Z91018 Allergy to other foods: Secondary | ICD-10-CM | POA: Diagnosis not present

## 2018-11-26 DIAGNOSIS — L89319 Pressure ulcer of right buttock, unspecified stage: Secondary | ICD-10-CM | POA: Diagnosis not present

## 2018-11-26 DIAGNOSIS — M4628 Osteomyelitis of vertebra, sacral and sacrococcygeal region: Secondary | ICD-10-CM | POA: Diagnosis present

## 2018-11-26 DIAGNOSIS — N319 Neuromuscular dysfunction of bladder, unspecified: Secondary | ICD-10-CM

## 2018-11-26 DIAGNOSIS — L89313 Pressure ulcer of right buttock, stage 3: Secondary | ICD-10-CM | POA: Diagnosis present

## 2018-11-26 DIAGNOSIS — T83518A Infection and inflammatory reaction due to other urinary catheter, initial encounter: Secondary | ICD-10-CM | POA: Diagnosis present

## 2018-11-26 DIAGNOSIS — Z807 Family history of other malignant neoplasms of lymphoid, hematopoietic and related tissues: Secondary | ICD-10-CM | POA: Diagnosis not present

## 2018-11-26 DIAGNOSIS — G8220 Paraplegia, unspecified: Secondary | ICD-10-CM | POA: Diagnosis present

## 2018-11-26 DIAGNOSIS — R52 Pain, unspecified: Secondary | ICD-10-CM | POA: Diagnosis present

## 2018-11-26 DIAGNOSIS — Z87891 Personal history of nicotine dependence: Secondary | ICD-10-CM

## 2018-11-26 DIAGNOSIS — Z89421 Acquired absence of other right toe(s): Secondary | ICD-10-CM | POA: Diagnosis not present

## 2018-11-26 DIAGNOSIS — N368 Other specified disorders of urethra: Secondary | ICD-10-CM | POA: Diagnosis present

## 2018-11-26 DIAGNOSIS — Z8739 Personal history of other diseases of the musculoskeletal system and connective tissue: Secondary | ICD-10-CM | POA: Diagnosis not present

## 2018-11-26 DIAGNOSIS — Z872 Personal history of diseases of the skin and subcutaneous tissue: Secondary | ICD-10-CM

## 2018-11-26 DIAGNOSIS — D638 Anemia in other chronic diseases classified elsewhere: Secondary | ICD-10-CM

## 2018-11-26 DIAGNOSIS — Z20828 Contact with and (suspected) exposure to other viral communicable diseases: Secondary | ICD-10-CM | POA: Diagnosis present

## 2018-11-26 DIAGNOSIS — B9620 Unspecified Escherichia coli [E. coli] as the cause of diseases classified elsewhere: Secondary | ICD-10-CM | POA: Diagnosis present

## 2018-11-26 DIAGNOSIS — L89159 Pressure ulcer of sacral region, unspecified stage: Secondary | ICD-10-CM

## 2018-11-26 DIAGNOSIS — N390 Urinary tract infection, site not specified: Secondary | ICD-10-CM | POA: Diagnosis not present

## 2018-11-26 DIAGNOSIS — Y846 Urinary catheterization as the cause of abnormal reaction of the patient, or of later complication, without mention of misadventure at the time of the procedure: Secondary | ICD-10-CM | POA: Diagnosis present

## 2018-11-26 DIAGNOSIS — L89154 Pressure ulcer of sacral region, stage 4: Secondary | ICD-10-CM | POA: Diagnosis present

## 2018-11-26 DIAGNOSIS — M8660 Other chronic osteomyelitis, unspecified site: Secondary | ICD-10-CM

## 2018-11-26 DIAGNOSIS — N3000 Acute cystitis without hematuria: Secondary | ICD-10-CM | POA: Diagnosis present

## 2018-11-26 DIAGNOSIS — D509 Iron deficiency anemia, unspecified: Secondary | ICD-10-CM | POA: Diagnosis present

## 2018-11-26 LAB — CBC
HCT: 28.9 % — ABNORMAL LOW (ref 39.0–52.0)
Hemoglobin: 7.6 g/dL — ABNORMAL LOW (ref 13.0–17.0)
MCH: 15.9 pg — ABNORMAL LOW (ref 26.0–34.0)
MCHC: 26.3 g/dL — ABNORMAL LOW (ref 30.0–36.0)
MCV: 60.3 fL — ABNORMAL LOW (ref 80.0–100.0)
Platelets: 520 10*3/uL — ABNORMAL HIGH (ref 150–400)
RBC: 4.79 MIL/uL (ref 4.22–5.81)
RDW: 21.6 % — ABNORMAL HIGH (ref 11.5–15.5)
WBC: 9.5 10*3/uL (ref 4.0–10.5)
nRBC: 0 % (ref 0.0–0.2)

## 2018-11-26 LAB — IRON AND TIBC
Iron: 9 ug/dL — ABNORMAL LOW (ref 45–182)
Saturation Ratios: 4 % — ABNORMAL LOW (ref 17.9–39.5)
TIBC: 257 ug/dL (ref 250–450)
UIBC: 249 ug/dL

## 2018-11-26 LAB — URINALYSIS, COMPLETE (UACMP) WITH MICROSCOPIC
Bilirubin Urine: NEGATIVE
Glucose, UA: NEGATIVE mg/dL
Hgb urine dipstick: NEGATIVE
Ketones, ur: NEGATIVE mg/dL
Nitrite: NEGATIVE
Protein, ur: NEGATIVE mg/dL
Specific Gravity, Urine: 1.031 — ABNORMAL HIGH (ref 1.005–1.030)
Squamous Epithelial / HPF: NONE SEEN (ref 0–5)
WBC, UA: >50 WBC/hpf — ABNORMAL HIGH (ref 0–5)
pH: 6 (ref 5.0–8.0)

## 2018-11-26 LAB — SEDIMENTATION RATE: Sed Rate: 73 mm/hr — ABNORMAL HIGH (ref 0–15)

## 2018-11-26 LAB — BASIC METABOLIC PANEL
Anion gap: 7 (ref 5–15)
BUN: 11 mg/dL (ref 6–20)
CO2: 25 mmol/L (ref 22–32)
Calcium: 8.4 mg/dL — ABNORMAL LOW (ref 8.9–10.3)
Chloride: 106 mmol/L (ref 98–111)
Creatinine, Ser: 0.47 mg/dL — ABNORMAL LOW (ref 0.61–1.24)
GFR calc Af Amer: >60 mL/min (ref >60)
GFR calc non Af Amer: >60 mL/min (ref >60)
Glucose, Bld: 109 mg/dL — ABNORMAL HIGH (ref 70–99)
Potassium: 3.7 mmol/L (ref 3.5–5.1)
Sodium: 138 mmol/L (ref 135–145)

## 2018-11-26 LAB — FERRITIN: Ferritin: 15 ng/mL — ABNORMAL LOW (ref 24–336)

## 2018-11-26 LAB — C-REACTIVE PROTEIN: CRP: 7.2 mg/dL — ABNORMAL HIGH (ref <1.0)

## 2018-11-26 LAB — MRSA PCR SCREENING: MRSA by PCR: NEGATIVE

## 2018-11-26 LAB — SARS CORONAVIRUS 2 BY RT PCR (HOSPITAL ORDER, PERFORMED IN ~~LOC~~ HOSPITAL LAB): SARS Coronavirus 2: NEGATIVE

## 2018-11-26 MED ORDER — VANCOMYCIN HCL IN DEXTROSE 750-5 MG/150ML-% IV SOLN
750.0000 mg | Freq: Two times a day (BID) | INTRAVENOUS | Status: DC
  Administered 2018-11-26 – 2018-11-27 (×2): 750 mg via INTRAVENOUS
  Filled 2018-11-26 (×6): qty 150

## 2018-11-26 MED ORDER — ACETAMINOPHEN 650 MG RE SUPP: 650.0000 mg | Freq: Four times a day (QID) | RECTAL | Status: DC | PRN

## 2018-11-26 MED ORDER — ACETAMINOPHEN 325 MG PO TABS: 650.0000 mg | ORAL_TABLET | Freq: Four times a day (QID) | ORAL | Status: DC | PRN

## 2018-11-26 MED ORDER — SODIUM CHLORIDE 0.9 % IV SOLN
2.0000 g | Freq: Three times a day (TID) | INTRAVENOUS | Status: DC
  Administered 2018-11-26 – 2018-11-27 (×3): 2 g via INTRAVENOUS
  Filled 2018-11-26 (×7): qty 2

## 2018-11-26 MED ORDER — ENSURE ENLIVE PO LIQD
237.0000 mL | Freq: Three times a day (TID) | ORAL | Status: DC
  Administered 2018-11-26 – 2018-11-28 (×6): 237 mL via ORAL

## 2018-11-26 MED ORDER — ONDANSETRON HCL 4 MG/2ML IJ SOLN: 4.0000 mg | Freq: Four times a day (QID) | INTRAMUSCULAR | Status: DC | PRN

## 2018-11-26 MED ORDER — DIPHENHYDRAMINE HCL 50 MG/ML IJ SOLN
INTRAMUSCULAR | Status: AC
  Administered 2018-11-26: 12.5 mg via INTRAVENOUS
  Filled 2018-11-26: qty 1

## 2018-11-26 MED ORDER — PIPERACILLIN-TAZOBACTAM 3.375 G IVPB 30 MIN
3.3750 g | Freq: Once | INTRAVENOUS | Status: AC
  Administered 2018-11-26: 3.375 g via INTRAVENOUS
  Filled 2018-11-26: qty 50

## 2018-11-26 MED ORDER — ONDANSETRON HCL 4 MG PO TABS: 4.0000 mg | ORAL_TABLET | Freq: Four times a day (QID) | ORAL | Status: DC | PRN

## 2018-11-26 MED ORDER — VITAMIN C 500 MG PO TABS
250.0000 mg | ORAL_TABLET | Freq: Two times a day (BID) | ORAL | Status: DC
  Administered 2018-11-26 – 2018-11-30 (×8): 250 mg via ORAL
  Filled 2018-11-26 (×8): qty 1

## 2018-11-26 MED ORDER — DIPHENHYDRAMINE HCL 50 MG/ML IJ SOLN
12.5000 mg | Freq: Once | INTRAMUSCULAR | Status: AC
  Administered 2018-11-26: 04:00:00 12.5 mg via INTRAVENOUS

## 2018-11-26 MED ORDER — HYDROMORPHONE HCL 1 MG/ML IJ SOLN
0.5000 mg | Freq: Once | INTRAMUSCULAR | Status: AC
  Administered 2018-11-26: 0.5 mg via INTRAVENOUS
  Filled 2018-11-26: qty 1

## 2018-11-26 MED ORDER — OCUVITE-LUTEIN PO CAPS
1.0000 | ORAL_CAPSULE | Freq: Every day | ORAL | Status: DC
  Administered 2018-11-27 – 2018-11-30 (×4): 1 via ORAL
  Filled 2018-11-26 (×4): qty 1

## 2018-11-26 MED ORDER — VANCOMYCIN HCL IN DEXTROSE 1-5 GM/200ML-% IV SOLN
1000.0000 mg | Freq: Once | INTRAVENOUS | Status: AC
  Administered 2018-11-26: 1000 mg via INTRAVENOUS
  Filled 2018-11-26: qty 200

## 2018-11-26 MED ORDER — HYDROXYZINE HCL 25 MG PO TABS
25.0000 mg | ORAL_TABLET | Freq: Three times a day (TID) | ORAL | Status: DC | PRN
  Administered 2018-11-26 (×2): 25 mg via ORAL
  Filled 2018-11-26 (×2): qty 1

## 2018-11-26 MED ORDER — ADULT MULTIVITAMIN W/MINERALS CH
1.0000 | ORAL_TABLET | Freq: Every day | ORAL | Status: DC
  Administered 2018-11-26 – 2018-11-30 (×5): 1 via ORAL
  Filled 2018-11-26 (×6): qty 1

## 2018-11-26 MED ORDER — OXYCODONE HCL 5 MG PO TABS
5.0000 mg | ORAL_TABLET | ORAL | Status: DC | PRN
  Administered 2018-11-27: 5 mg via ORAL
  Filled 2018-11-26: qty 1

## 2018-11-26 MED ORDER — HYDROMORPHONE HCL 1 MG/ML IJ SOLN
1.0000 mg | Freq: Once | INTRAMUSCULAR | Status: AC
  Administered 2018-11-26: 1 mg via INTRAVENOUS
  Filled 2018-11-26: qty 1

## 2018-11-26 MED ORDER — MORPHINE SULFATE (PF) 2 MG/ML IV SOLN
2.0000 mg | INTRAVENOUS | Status: DC | PRN
  Administered 2018-11-26 (×2): 2 mg via INTRAVENOUS
  Filled 2018-11-26 (×2): qty 1

## 2018-11-26 MED ORDER — IOHEXOL 300 MG/ML  SOLN
100.0000 mL | Freq: Once | INTRAMUSCULAR | Status: AC | PRN
  Administered 2018-11-26: 100 mL via INTRAVENOUS

## 2018-11-26 NOTE — Progress Notes (Signed)
Pt still itching after benadryl, requesting something else for itching. MD paged. Dr. Earleen Newport put in orders for atarax PRN. Will give  And continue to monitor.  Conley Simmonds, RN, BSN

## 2018-11-26 NOTE — Consult Note (Signed)
[place] Nurse wound follow up [place] Nurse is working remotely today.  Dr. Estanislado Pandy and Bedside RN Richardson Landry have been contacted regarding patient's request.  WOC RN does not provide direct patient care and has assessed the patient's wounds via technology (Dr. Pasty Arch photographs taken at 4am). Orders provided. Dr. Estanislado Pandy to discuss refusal for wound measurements/wound care with patient.  [place] nursing team will not follow, but will remain available to this patient, the nursing and medical teams.  Please re-consult if needed. Thanks, Maudie Flakes, MSN, RN, [place], Arther Abbott  Pager# [phone]

## 2018-11-26 NOTE — Progress Notes (Signed)
[place] [facility] [place] NAME: Jonathon York    MR#:  [id number]  DATE OF BIRTH:  04-18-1976  SUBJECTIVE:  CHIEF COMPLAINT:   Chief Complaint  Patient presents with   Wound Check  Patient seen and evaluated today Concerned that urine is leaking from his wound Has buttock wounds Has paraplegia No complaints of chest pain, shortness of breath  REVIEW OF SYSTEMS:    ROS  CONSTITUTIONAL: No documented fever. No fatigue, weakness. No weight gain, no weight loss.  EYES: No blurry or double vision.  ENT: No tinnitus. No postnasal drip. No redness of the oropharynx.  RESPIRATORY: No cough, no wheeze, no hemoptysis. No dyspnea.  CARDIOVASCULAR: No chest pain. No orthopnea. No palpitations. No syncope.  GASTROINTESTINAL: No nausea, no vomiting or diarrhea. No abdominal pain. No melena or hematochezia.  GENITOURINARY: No dysuria or hematuria.  ENDOCRINE: No polyuria or nocturia. No heat or cold intolerance.  HEMATOLOGY: No anemia. No bruising. No bleeding.  INTEGUMENTARY: Has buttock wounds.  MUSCULOSKELETAL: No arthritis. No swelling. No gout.  NEUROLOGIC: No numbness, tingling, or ataxia. No seizure-type activity.  PSYCHIATRIC: No anxiety. No insomnia. No ADD.   DRUG ALLERGIES:   Allergies  Allergen Reactions   Orange Fruit [Citrus] Other (See Comments)    blisters    VITALS:  Blood pressure 104/74, pulse 70, temperature 97.6 F (36.4 C), temperature source Oral, resp. rate 18, height 5\' 10"  (1.778 m), weight 52.2 kg, SpO2 97 %.  PHYSICAL EXAMINATION:   Physical Exam  GENERAL:  43 y.o.-year-old patient lying in the bed with no acute distress.  EYES: Pupils equal, round, reactive to light and accommodation. No scleral icterus. Extraocular muscles intact.  HEENT: Head atraumatic, normocephalic. Oropharynx and nasopharynx clear.  NECK:  Supple, no jugular venous distention. No thyroid enlargement, no tenderness.  LUNGS: Normal breath  sounds bilaterally, no wheezing, rales, rhonchi. No use of accessory muscles of respiration.  CARDIOVASCULAR: S1, S2 normal. No murmurs, rubs, or gallops.  ABDOMEN: Soft, nontender, nondistended. Bowel sounds present. No organomegaly or mass.  EXTREMITIES: No cyanosis, clubbing or edema b/l.    NEUROLOGIC: Cranial nerves II through XII are intact. Motor strength 5/ 5 upper extremities Paraplegia PSYCHIATRIC: The patient is alert and oriented x 3.  SKIN: Decubitus ulcers noted    LABORATORY PANEL:   CBC Recent Labs  Lab 11/26/18 0447  WBC 9.5  HGB 7.6*  HCT 28.9*  PLT 520*   ------------------------------------------------------------------------------------------------------------------ Chemistries  Recent Labs  Lab 11/25/18 2153 11/26/18 0447  NA 139 138  K 3.6 3.7  CL 105 106  CO2 25 25  GLUCOSE 112* 109*  BUN 8 11  CREATININE 0.46* 0.47*  CALCIUM 8.8* 8.4*  AST 11*  --   ALT 8  --   ALKPHOS 78  --   BILITOT 0.4  --    ------------------------------------------------------------------------------------------------------------------  Cardiac Enzymes No results for input(s): TROPONINI in the last 168 hours. ------------------------------------------------------------------------------------------------------------------  RADIOLOGY:  Ct Abdomen Pelvis W Contrast  Result Date: 11/26/2018 CLINICAL DATA:  Rectal vesicular fistula sacral wounds EXAM: CT ABDOMEN AND PELVIS WITH CONTRAST TECHNIQUE: Multidetector CT imaging of the abdomen and pelvis was performed using the standard protocol following bolus administration of intravenous contrast. CONTRAST:  17mL OMNIPAQUE IOHEXOL 300 MG/ML  SOLN COMPARISON:  04/22/2018, 03/18/2018 FINDINGS: Lower chest: Lung bases demonstrate no acute consolidation or effusion. Heart size is within normal limits. Hepatobiliary: Subcentimeter hypodensity within the right hepatic lobe, too small to further characterize. No  calcified  gallstone or biliary dilatation Pancreas: Unremarkable. No pancreatic ductal dilatation or surrounding inflammatory changes. Spleen: Normal in size without focal abnormality. Adrenals/Urinary Tract: Adrenal glands are normal. Cyst in the left kidney. No hydronephrosis. Catheter is present within the urinary bladder. The bladder is empty and thick-walled in appearance with small amount of air. Delayed images of the bladder demonstrate no extravasation to the extra or intraperitoneal soft tissues. Stomach/Bowel: Moderate fluid in the stomach. No dilated small bowel. No bowel wall thickening. Negative appendix. Vascular/Lymphatic: Moderate to marked aortic atherosclerosis without aneurysm. Slightly enlarged left greater than right pelvic sidewall nodes. These measure up to 9 mm on the left and 7 mm on the right and or slightly increased compared to prior. Reproductive: Prostate is unremarkable. Other: Negative for free air or free fluid Musculoskeletal: Bony resorptive changes involving the sacrum, posterior acetabula, bilateral ischial bones. Sclerosis within the left posterior iliac bone, acetabula, and ischial bones consistent with chronic osteomyelitis, these findings do not appear significantly changed. Severe sacral decubitus ulcers. Progressed soft tissue loss at the base of the right penis with possible exposure of a portion of the Foley catheter. IMPRESSION: 1. Advanced sacral decubitus ulcers with progressed soft tissue loss at the base of the right penis. Possible exposure of a portion of the urinary catheter here, recommend correlation with direct inspection. 2. Negative for intra or extraperitoneal bladder rupture. The bladder is severely thick-walled 3. Bony resorptive changes and sclerosis involving the sacrum, posterior acetabula and ischial bones consistent with chronic osteomyelitis, overall unchanged as compared with the prior exam 4. Slight increased pelvic adenopathy Electronically Signed   By: Jasmine PangKim   Fujinaga M.D.   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 Management plans discussed with the patient, family and they are in agreement.  CODE STATUS: Full code  DVT Prophylaxis: SCDs  TOTAL TIME TAKING CARE OF THIS PATIENT: 45 minutes.   POSSIBLE D/C IN  2 to 3 DAYS, DEPENDING ON CLINICAL CONDITION.  Ihor AustinPavan Aireonna Bauer M.D on 11/26/2018 at 11:59 AM  Between 7am to 6pm - Pager - [phone]  After 6pm go to [email or web address] - password [email or web address]  [facility]  Office  [phone]  CC: Primary care physician; [place]err[place]n [place], [facility]  Note: This dictation was prepared with Dragon dictation along with smaller phrase technology. Any transcriptional errors that result from this process are unintentional.

## 2018-11-26 NOTE — Progress Notes (Signed)
Transfer from 2A tele. Oriented to [facility] room 202. A/o. Placed on specialty bed with explanation. Patient able to transfer himself over to the new bed. Arm strength good.

## 2018-11-26 NOTE — Plan of Care (Signed)
  Problem: Clinical Measurements: Goal: Respiratory complications will improve Outcome: Progressing   Problem: Nutrition: Goal: Adequate nutrition will be maintained Outcome: Progressing   Problem: Coping: Goal: Level of anxiety will decrease Outcome: Progressing   Problem: Pain Managment: Goal: General experience of comfort will improve Outcome: Progressing Note: Treated for pain once with morphine   Problem: Safety: Goal: Ability to remain free from injury will improve Outcome: Progressing   Problem: Education: Goal: Knowledge of General Education information will improve Description: Including pain rating scale, medication(s)/side effects and non-pharmacologic comfort measures Outcome: Completed/Met

## 2018-11-26 NOTE — Consult Note (Signed)
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 Past diagnosis of osteomyelitis.  Seen intermittently by the [facility] team for the past 4 years, last seen in December 2019 by my partner, S. Tora Perches. He has been seen in the past by surgery (Dr. Dahlia Byes). At present, there is no obvious indication for an operative procedure. Attempts at NPWT therapy in the past have been unsuccessful due to the wounds proximity to the rectum and due to the fact that patient sits in his wheelchair or upright in his bed the majority of the time. Nutritional support is lacking. Wound type:Pressure, chronic Pressure Injury POA: Yes Measurement: To be taken by Bedside RN, Richardson Landry and entered into flow sheet per our conversation at 8:25am. Wound bed:red, smooth (nongranulating tissue). See photo taken by Dr. Leslye Peer in his note from earlier today. Drainage (amount, consistency, odor) moderate amounts of serous to light yellow exudate. Periwound: intact with evidence of previous wound healing and contraction (scarring). Mild maceration. Dressing procedure/placement/frequency:  Patient reportedly ran out of wound care supplies 2 months ago. Reports he uses salt water and vinegar to clean his wounds.   Wounds are clean and without evidence of necrotic tissue.  I have initiated guidance for the care of these wounds via the Orders. Daily care will be to cleanse with NS, pat gently dry.  The periwound skin is to be protected with a skin barrier film wipe and allowed to dry completely. Wounds will be covered with a silver hydrofiber dressing (Aquacel Ag+) and topped with dry dressings/secured with tape. Patient is on a mattress replacement with low air loss feature.  Nursing is provided with guidance for [facility] to be at or below a 30 degree angle (for ischial tuberosity) and for patient to avoid the supine position (for sacrum).  Social  issues (supplies, care provider, nutrition) and patient's willingness/ability to adhere to treatment plan regarding pressure redistribution have prevented healing in the past. These wounds would fall into the  "maintenance" descriptor and are not likely to heal.  Recommend consideration of surgical, social work and nutritional consultations.  If you agree, please order/arrange.  I have discussed the POC with patient's Bedside RN Richardson Landry) this morning. He will assist me by measuring the patients 3 wounds and entering those measurements on the daily flow sheet when he performs wound care.   [place] nursing team will not follow, but will remain available to this patient, the nursing and medical teams.  Please re-consult if needed. Thanks, Maudie Flakes, MSN, RN, [place], [facility] Abbott  Pager# [phone]

## 2018-11-26 NOTE — H&P (Addendum)
[name]    MR#:  [id number]  DATE OF BIRTH:  1975/07/18  DATE OF ADMISSION:  11/26/2018  PRIMARY CARE PHYSICIAN: No PCP  REQUESTING/REFERRING PHYSICIAN: Dr Jonathon York  CHIEF COMPLAINT:   Chief Complaint  Patient presents with  . Wound Check    HISTORY OF PRESENT ILLNESS:  Jonathon York  is a 43 y.o. 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  He has needed in and out catheterizations for the last 3 years.  He ran out of the supplies 2 months ago.  He rigged a long tube to collect his urine.  He thought he perforated his bladder because urine is been leaking out of his sacral wounds.  He has a fever and he was concerned that he may be septic.  He is having worsening pain and required 3 rounds of IV pain medications in the emergency room.  Urine analysis was found to be positive.  Hospitalist services contacted for further evaluation.  CT scan was negative for bladder rupture.  Positive for chronic osteomyelitis  PAST MEDICAL HISTORY:   Past Medical History:  Diagnosis Date  . Anxiety   . Decubitus ulcer   . Depression   . Neurogenic bladder   . Osteomyelitis ([place])   . Paraparesis of both lower limbs ([place]) 02/21/00    PAST SURGICAL HISTORY:   Past Surgical History:  Procedure Laterality Date  . AMPUTATION Right 03/19/2018   Procedure: 5th Metatarsal Resection;  Surgeon: Jonathon York, DPM;  Location: [facility] ORS;  Service: Podiatry;  Laterality: Right;  . AMPUTATION TOE Right 04/29/2015   Procedure: AMPUTATION TOE;  Surgeon: Jonathon Alamo, MD;  Location: [facility] ORS;  Service: Podiatry;  Laterality: Right;  . IRRIGATION AND DEBRIDEMENT BUTTOCKS    . TOE AMPUTATION Right     SOCIAL HISTORY:   Social History   Tobacco Use  . Smoking status: Former Smoker    Packs/day: 0.50    Types: Cigarettes  . Smokeless tobacco: Never Used  Substance Use Topics  .  Alcohol use: No    FAMILY HISTORY:   Family History  Problem Relation Age of Onset  . Lymphoma Sister     DRUG ALLERGIES:   Allergies  Allergen Reactions  . Orange Fruit [Citrus] Other (See Comments)    blisters    REVIEW OF SYSTEMS:  CONSTITUTIONAL: Positive for fever and chills.  Positive for weight loss.  Positive for fatigue.  EYES: No blurred or double vision.  EARS, NOSE, AND THROAT: No tinnitus or ear pain. No sore throat.  Positive for runny nose RESPIRATORY: No cough, positive for shortness of breath, no wheezing or hemoptysis.  CARDIOVASCULAR: No chest pain, orthopnea, edema.  GASTROINTESTINAL: No nausea, vomiting, diarrhea or abdominal pain. No blood in bowel movements GENITOURINARY: No dysuria, hematuria.  ENDOCRINE: No polyuria, nocturia,  HEMATOLOGY: No anemia, easy bruising or bleeding SKIN: No rash or lesion. MUSCULOSKELETAL: Pain in the hips and buttock NEUROLOGIC: No tingling, numbness, weakness.  PSYCHIATRY: Positive for anxiety and depression.   MEDICATIONS AT HOME:   Prior to Admission medications   Medication Sig Start Date End Date Taking? Authorizing Provider  ibuprofen (ADVIL) 200 MG tablet Take 400 mg by mouth every 6 (six) hours as needed for mild pain.   Yes [provider]  Multiple Vitamin (MULTIVITAMIN WITH MINERALS) TABS tablet Take 1 tablet by mouth daily. 11/22/16  Yes Jonathon York,  Jonathon Macadamia, MD      VITAL SIGNS:  Blood pressure 127/82, pulse 77, temperature 98.7 F (37.1 C), temperature source Oral, resp. rate 16, height 5' 10"  (1.778 m), weight 52.2 kg, SpO2 100 %.  PHYSICAL EXAMINATION:  GENERAL:  43 y.o.-year-old patient lying in the bed with no acute distress.  EYES: Pupils equal, round, reactive to light and accommodation. No scleral icterus. Extraocular muscles intact.  HEENT: Head atraumatic, normocephalic. Oropharynx and nasopharynx clear.  NECK:  Supple, no jugular venous distention. No thyroid enlargement, no  tenderness.  LUNGS: Decreased breath sounds bilateral bases, no wheezing, rales,rhonchi or crepitation. No use of accessory muscles of respiration.  CARDIOVASCULAR: S1, S2 normal. No murmurs, rubs, or gallops.  ABDOMEN: Soft, nontender, nondistended. Bowel sounds present. No organomegaly or mass.  EXTREMITIES: No pedal edema, cyanosis, or clubbing.  NEUROLOGIC: Cranial nerves II through XII are intact. Muscle strength 5/5 in all extremities. Sensation intact. Gait not checked.  PSYCHIATRIC: The patient is alert and oriented x 3.  SKIN: 3 large decubitus ulcers.  Please see picture    LABORATORY PANEL:   CBC Recent Labs  Lab 11/25/18 2153  WBC 7.1  HGB 8.4*  HCT 32.4*  PLT 587*   ------------------------------------------------------------------------------------------------------------------  Chemistries  Recent Labs  Lab 11/25/18 2153  NA 139  K 3.6  CL 105  CO2 25  GLUCOSE 112*  BUN 8  CREATININE 0.46*  CALCIUM 8.8*  AST 11*  ALT 8  ALKPHOS 78  BILITOT 0.4   ------------------------------------------------------------------------------------------------------------------    RADIOLOGY:  Ct Abdomen Pelvis W Contrast  Result Date: 11/26/2018 CLINICAL DATA:  Rectal vesicular fistula sacral wounds EXAM: CT ABDOMEN AND PELVIS WITH CONTRAST TECHNIQUE: Multidetector CT imaging of the abdomen and pelvis was performed using the standard protocol following bolus administration of intravenous contrast. CONTRAST:  142m OMNIPAQUE IOHEXOL 300 MG/ML  SOLN COMPARISON:  04/22/2018, 03/18/2018 FINDINGS: Lower chest: Lung bases demonstrate no acute consolidation or effusion. Heart size is within normal limits. Hepatobiliary: Subcentimeter hypodensity within the right hepatic lobe, too small to further characterize. No calcified gallstone or biliary dilatation Pancreas: Unremarkable. No pancreatic ductal dilatation or surrounding inflammatory changes. Spleen: Normal in size without  focal abnormality. Adrenals/Urinary Tract: Adrenal glands are normal. Cyst in the left kidney. No hydronephrosis. Catheter is present within the urinary bladder. The bladder is empty and thick-walled in appearance with small amount of air. Delayed images of the bladder demonstrate no extravasation to the extra or intraperitoneal soft tissues. Stomach/Bowel: Moderate fluid in the stomach. No dilated small bowel. No bowel wall thickening. Negative appendix. Vascular/Lymphatic: Moderate to marked aortic atherosclerosis without aneurysm. Slightly enlarged left greater than right pelvic sidewall nodes. These measure up to 9 mm on the left and 7 mm on the right and or slightly increased compared to prior. Reproductive: Prostate is unremarkable. Other: Negative for free air or free fluid Musculoskeletal: Bony resorptive changes involving the sacrum, posterior acetabula, bilateral ischial bones. Sclerosis within the left posterior iliac bone, acetabula, and ischial bones consistent with chronic osteomyelitis, these findings do not appear significantly changed. Severe sacral decubitus ulcers. Progressed soft tissue loss at the base of the right penis with possible exposure of a portion of the Foley catheter. IMPRESSION: 1. Advanced sacral decubitus ulcers with progressed soft tissue loss at the base of the right penis. Possible exposure of a portion of the urinary catheter here, recommend correlation with direct inspection. 2. Negative for intra or extraperitoneal bladder rupture. The bladder is severely thick-walled 3. Bony resorptive changes  and sclerosis involving the sacrum, posterior acetabula and ischial bones consistent with chronic osteomyelitis, overall unchanged as compared with the prior exam 4. Slight increased pelvic adenopathy Electronically Signed   By: Donavan Foil M.D.   On: 11/26/2018 02:23      IMPRESSION AND PLAN:   1.  Intractable pain.  Start oral oxycodone and IV morphine since the Dilaudid is  causing itching.  PRN Benadryl. 2.  Acute cystitis secondary to self catheterizations with nonsterile material.  Follow-up urine culture.  Aggressive antibiotics. 3.  Chronic osteomyelitis with 3 large sacral decubiti.  ER physician sent off culture.  Will consult infectious disease.  Aggressive antibiotics with vancomycin and cefepime.  Send off an ESR and CRP.  Wound care consult. 4.  Anemia.  Send off iron studies.  Likely of chronic disease. 5.  Paralysis from the waist down secondary to injury of falling through a roof. 6.  History of anxiety depression  All the records are reviewed and case discussed with ED provider. Management plans discussed with the patient, and he is in agreement.  CODE STATUS: Full code  TOTAL TIME TAKING CARE OF THIS PATIENT: 50 minutes.    Loletha Grayer M.D on 11/26/2018 at 4:14 AM  Between 7am to 6pm - Pager - [phone]  After 6pm call admission pager [phone]  [facility]  [phone]  CC: Primary care physician; no PCP

## 2018-11-26 NOTE — Consult Note (Signed)
NAME: Jonathon York  DOB: [birth year]  MRN: [id number]  Date/Time: 11/26/2018 7:42 PM  REQUESTING PROVIDER: Dr. Hilton SinclairWeiting Subjective:  REASON FOR CONSULT: Sacral decubitus ? Jonathon York is a 43 y.o. male with a history of paraplegia, chronic pressure ulcers with osteomyelitis in the past and currently has been doing well l, neurogenic bladder, presented to the ED yesterday as he was worried that he had punctured his bladder when he was self catheterizing and found that urine was leaking from the sacral wound. As per patient he has had chronic Foley catheter.  He changed it himself every month.  But 3 months ago he could not get anymore catheter as he had to go to see a urologist.  He did not want to go to see any physicians because of the pandemic.  So he had a box of straight cath and he was using it and taping it to drain his bladder.  A week ago he noted that the sacral wound was having more drainage than usual and it smelled and felt like urine.  So he was worried that he had punctured his bladder with a straight cath.  And then came to the ED.  He did not have any fever but he was worried that he could get septic.  He had some pain in his lower abdomen.  He lives on his own.  He keeps his wound very clean and takes care of it by himself.  Past Medical History:  Diagnosis Date  . Anxiety   . Decubitus ulcer   . Depression   . Neurogenic bladder   . Osteomyelitis (HCC)   . 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 AMPUTATION Right 03/19/2018   Procedure: 5th Metatarsal Resection;  Surgeon: Recardo Evangelistroxler, Matthew, DPM;  Location: [facility] ORS;  Service: Podiatry;  Laterality: Right;  . AMPUTATION TOE Right 04/29/2015   Procedure: AMPUTATION TOE;  Surgeon: Linus Galasodd Cline, MD;  Location: [facility] ORS;  Service: Podiatry;  Laterality: Right;  . IRRIGATION AND DEBRIDEMENT BUTTOCKS    . TOE AMPUTATION Right     Social History   Socioeconomic  History  . Marital status: Single    Spouse name: Not on file  . Number of children: Not on file  . Years of education: Not on file  . Highest education level: Not on file  Occupational History  . Not on file  Social Needs  . Financial resource strain: Not on file  . Food insecurity    Worry: Not on file    Inability: Not on file  . Transportation needs    Medical: Not on file    Non-medical: Not on file  Tobacco Use  . Smoking status: Former Smoker    Packs/day: 0.50    Types: Cigarettes  . Smokeless tobacco: Never Used  Substance and Sexual Activity  . Alcohol use: No  . Drug use: Yes    Types: Marijuana    Comment: 2 days ago  . Sexual activity: Never  Lifestyle  . Physical activity    Days per week: Not on file    Minutes per session: Not on file  . Stress: Not on file  Relationships  . Social Musicianconnections    Talks on phone: Not on file    Gets together: Not on file    Attends religious service: Not on file    Active member of club or organization: Not on file  Attends meetings of clubs or organizations: Not on file    Relationship status: Not on file  . Intimate partner violence    Fear of current or ex partner: Not on file    Emotionally abused: Not on file    Physically abused: Not on file    Forced sexual activity: Not on file  Other Topics Concern  . Not on file  Social History Narrative  . Not on file    Family History  Problem Relation Age of Onset  . Lymphoma Sister    Allergies  Allergen Reactions  . Orange Fruit [Citrus] Other (See Comments)    blisters    ? Current Facility-Administered Medications  Medication Dose Route Frequency Provider Last Rate Last Dose  . acetaminophen (TYLENOL) tablet 650 mg  650 mg Oral Q6H PRN Alford HighlandWieting, [place]fle[place], MD       Or  . acetaminophen (TYLENOL) suppository 650 mg  650 mg Rectal Q6H PRN Wieting, Clarion, MD      . ceFEPIme (MAXIPIME) 2 g in sodium chloride 0.9 % 100 mL IVPB  2 g Intravenous Q8H Wieting,  Rayden, MD 200 mL/hr at 11/26/18 0930 2 g at 11/26/18 0930  . feeding supplement (ENSURE ENLIVE) (ENSURE ENLIVE) liquid 237 mL  237 mL Oral TID BM Pyreddy, Pavan, MD   237 mL at 11/26/18 1248  . hydrOXYzine (ATARAX/VISTARIL) tablet 25 mg  25 mg Oral TID PRN Alford HighlandWieting, Sonnie, MD   25 mg at 11/26/18 1735  . morphine 2 MG/ML injection 2 mg  2 mg Intravenous Q4H PRN Alford HighlandWieting, Marquin, MD   2 mg at 11/26/18 1735  . multivitamin with minerals tablet 1 tablet  1 tablet Oral Daily Alford HighlandWieting, Ruston, MD   1 tablet at 11/26/18 [phone]  . [START ON 11/27/2018] multivitamin-lutein (OCUVITE-LUTEIN) capsule 1 capsule  1 capsule Oral Daily Pyreddy, Pavan, MD      . ondansetron (ZOFRAN) tablet 4 mg  4 mg Oral Q6H PRN Wieting, Kyros, MD       Or  . ondansetron (ZOFRAN) injection 4 mg  4 mg Intravenous Q6H PRN Wieting, Arbor, MD      . oxyCODONE (Oxy IR/ROXICODONE) immediate release tablet 5 mg  5 mg Oral Q4H PRN Wieting, Jarad, MD      . vancomycin (VANCOCIN) IVPB 750 mg/150 ml premix  750 mg Intravenous Q12H Alford HighlandWieting, Edmund, MD 150 mL/hr at 11/26/18 1859 750 mg at 11/26/18 1859  . vitamin C (ASCORBIC ACID) tablet 250 mg  250 mg Oral BID Ihor AustinPyreddy, Pavan, MD         Abtx:  Anti-infectives (From admission, onward)   Start     Dose/Rate Route Frequency Ordered Stop   11/26/18 1800  vancomycin (VANCOCIN) IVPB 750 mg/150 ml premix     750 mg 150 mL/hr over 60 Minutes Intravenous Every 12 hours 11/26/18 0842     11/26/18 1100  ceFEPIme (MAXIPIME) 2 g in sodium chloride 0.9 % 100 mL IVPB     2 g 200 mL/hr over 30 Minutes Intravenous Every 8 hours 11/26/18 0842     11/26/18 0245  vancomycin (VANCOCIN) IVPB 1000 mg/200 mL premix     1,000 mg 200 mL/hr over 60 Minutes Intravenous  Once 11/26/18 0232 11/26/18 0421   11/26/18 0245  piperacillin-tazobactam (ZOSYN) IVPB 3.375 g     3.375 g 100 mL/hr over 30 Minutes Intravenous  Once 11/26/18 0232 11/26/18 0320      REVIEW OF SYSTEMS:  Const: negative fever,  negative chills, negative  weight loss Eyes: negative diplopia or visual changes, negative eye pain ENT: negative coryza, negative sore throat Resp: negative cough, hemoptysis, dyspnea Cards: negative for chest pain, palpitations, lower extremity edema GU: Neurogenic bladder see above   GI: Negative for abdominal pain, diarrhea, bleeding, constipation Skin: negative for rash and pruritus Heme: negative for easy bruising and gum/nose bleeding MS: Some weakness Neurolo: Paraplegia Psych: negative for feelings of anxiety, depression  Endocrine: negative for thyroid, diabetes issues Allergy/Immunology-allergic to orange fruit Objective:  VITALS:  BP 104/74 (BP Location: Right Arm)   Pulse 70   Temp 97.6 F (36.4 C) (Oral)   Resp 18   Ht 5\' 10"  (1.778 m)   Wt 52.2 kg   SpO2 97%   BMI 16.50 kg/m  PHYSICAL EXAM:  General: Alert, cooperative, no distress, appears stated age.  Pale Head: Normocephalic, without obvious abnormality, atraumatic. Eyes: Conjunctivae clear, anicteric sclerae. Pupils are equal ENT did not examine because of the mask Neck: Supple,  Back: Scar over the thoracic spine 3 areas of large sacral wounds which are pink with no evidence of infection.  There is no discharge from them.   .  Foley catheter present Lungs: Clear to auscultation bilaterally. No Wheezing or Rhonchi. No rales. Heart: Regular rate and rhythm, no murmur, rub or gallop. Abdomen: Soft, non-tender,not distended. Bowel sounds normal. No masses Extremities: Right thigh has healing burn  skin: As above Neurologic: Paraplegia Pertinent Labs Lab Results CBC    Component Value Date/Time   WBC 9.5 11/26/2018 0447   RBC 4.79 11/26/2018 0447   HGB 7.6 (L) 11/26/2018 0447   HGB 8.6 (L) 12/25/2013 0542   HCT 28.9 (L) 11/26/2018 0447   HCT 28.3 (L) 12/25/2013 0542   PLT 520 (H) 11/26/2018 0447   PLT 488 (H) 12/30/2013 0518   MCV 60.3 (L) 11/26/2018 0447   MCV 73 (L) 12/25/2013 0542   MCH 15.9  (L) 11/26/2018 0447   MCHC 26.3 (L) 11/26/2018 0447   RDW 21.6 (H) 11/26/2018 0447   RDW 18.0 (H) 12/25/2013 0542   LYMPHSABS 1.7 11/25/2018 2153   LYMPHSABS 1.2 12/25/2013 0542   MONOABS 0.3 11/25/2018 2153   MONOABS 0.5 12/25/2013 0542   EOSABS 0.1 11/25/2018 2153   EOSABS 0.3 12/25/2013 0542   BASOSABS 0.1 11/25/2018 2153   BASOSABS 0.1 12/25/2013 0542    CMP Latest Ref Rng & Units 11/26/2018 11/25/2018 04/23/2018  Glucose 70 - 99 mg/dL 409(W) 119(J) 99  BUN 6 - 20 mg/dL 11 8 6   Creatinine 0.61 - 1.24 mg/dL 4.78(G) 9.56(O) 1.30(Q)  Sodium 135 - 145 mmol/L 138 139 139  Potassium 3.5 - 5.1 mmol/L 3.7 3.6 3.9  Chloride 98 - 111 mmol/L 106 105 114(H)  CO2 22 - 32 mmol/L 25 25 15(L)  Calcium 8.9 - 10.3 mg/dL 6.5(H) 8.4(O) 9.6(E)  Total Protein 6.5 - 8.1 g/dL - 8.2(H) -  Total Bilirubin 0.3 - 1.2 mg/dL - 0.4 -  Alkaline Phos 38 - 126 U/L - 78 -  AST 15 - 41 U/L - 11(L) -  ALT 0 - 44 U/L - 8 -      Microbiology: Recent Results (from the past 240 hour(s))  Aerobic/Anaerobic Culture (surgical/deep wound)     Status: None (Preliminary result)   Collection Time: 11/26/18  1:19 AM   Specimen: Buttocks; Wound  Result Value Ref Range Status   Specimen Description   Final    BUTTOCKS Performed at [facility], [address], [place], [place]  [address]    Special Requests   Final    Normal Performed at [facility], [place] [address]    Gram Stain   Final    FEW WBC PRESENT, PREDOMINANTLY PMN NO ORGANISMS SEEN Performed at [facility] [address] [place], [place] [address]    Culture PENDING  Incomplete   Report Status PENDING  Incomplete  SARS Coronavirus 2 (CEPHEID - Performed in [facility] hospital lab), Hosp Order     Status: None   Collection Time: 11/26/18  1:30 AM   Specimen: Buttocks; Nasopharyngeal  Result Value Ref Range Status   SARS Coronavirus 2 NEGATIVE NEGATIVE Final    Comment: (NOTE) If result is  NEGATIVE SARS-CoV-2 target nucleic acids are NOT DETECTED. The SARS-CoV-2 RNA is generally detectable in upper and lower  respiratory specimens during the acute phase of infection. The lowest  concentration of SARS-CoV-2 viral copies this assay can detect is 250  copies / mL. A negative result does not preclude SARS-CoV-2 infection  and should not be used as the sole basis for treatment or other  patient management decisions.  A negative result may occur with  improper specimen collection / handling, submission of specimen other  than nasopharyngeal swab, presence of viral mutation(s) within the  areas targeted by this assay, and inadequate number of viral copies  (<250 copies / mL). A negative result must be combined with clinical  observations, patient history, and epidemiological information. If result is POSITIVE SARS-CoV-2 target nucleic acids are DETECTED. The SARS-CoV-2 RNA is generally detectable in upper and lower  respiratory specimens dur ing the acute phase of infection.  Positive  results are indicative of active infection with SARS-CoV-2.  Clinical  correlation with patient history and other diagnostic information is  necessary to determine patient infection status.  Positive results do  not rule out bacterial infection or co-infection with other viruses. If result is PRESUMPTIVE POSTIVE SARS-CoV-2 nucleic acids MAY BE PRESENT.   A presumptive positive result was obtained on the submitted specimen  and confirmed on repeat testing.  While 2019 novel coronavirus  (SARS-CoV-2) nucleic acids may be present in the submitted sample  additional confirmatory testing may be necessary for epidemiological  and / or clinical management purposes  to differentiate between  SARS-CoV-2 and other Sarbecovirus currently known to infect humans.  If clinically indicated additional testing with an alternate test  methodology [phone]) is advised. The SARS-CoV-2 RNA is generally  detectable  in upper and lower respiratory sp ecimens during the acute  phase of infection. The expected result is Negative. Fact Sheet for Patients:  StrictlyIdeas.no Fact Sheet for Healthcare Providers: BankingDealers.co.za This test is not yet approved or cleared by the [place] FDA and has been authorized for detection and/or diagnosis of SARS-CoV-2 by FDA under an Emergency Use Authorization (EUA).  This EUA will remain in effect (meaning this test can be used) for the duration of the COVID-19 declaration under Section 564(b)(1) of the Act, 21 U.S.C. section 360bbb-3(b)(1), unless the authorization is terminated or revoked sooner. Performed at [facility], [address] [place], [place] [address]   Culture, blood (routine x 2)     Status: None (Preliminary result)   Collection Time: 11/26/18  1:31 AM   Specimen: BLOOD  Result Value Ref Range Status   Specimen Description BLOOD LEFT ANTECUBITAL  Final   Special Requests   Final    BOTTLES DRAWN AEROBIC AND ANAEROBIC Blood  Culture results may not be optimal due to an excessive volume of blood received in culture bottles   Culture   Final    NO GROWTH < 12 HOURS Performed at [facility], [address] [place], [place] [address]    Report Status PENDING  Incomplete  Culture, blood (routine x 2)     Status: None (Preliminary result)   Collection Time: 11/26/18  2:13 AM   Specimen: BLOOD  Result Value Ref Range Status   Specimen Description BLOOD RIGHT ANTECUBITAL  Final   Special Requests   Final    BOTTLES DRAWN AEROBIC AND ANAEROBIC Blood Culture results may not be optimal due to an excessive volume of blood received in culture bottles   Culture   Final    NO GROWTH < 12 HOURS Performed at [facility], [address] [place], [place] [address]    Report Status PENDING  Incomplete  MRSA PCR Screening     Status: None   Collection Time: 11/26/18   5:25 AM   Specimen: Nasal Mucosa; Nasopharyngeal  Result Value Ref Range Status   MRSA by PCR NEGATIVE NEGATIVE Final    Comment:        The GeneXpert MRSA Assay (FDA approved for NASAL specimens only), is one component of a comprehensive MRSA colonization surveillance program. It is not intended to diagnose MRSA infection nor to guide or monitor treatment for MRSA infections. Performed at [facility], [address] [place], [place] [address]    CT abdomen and pelvis IMAGING RESULTS: Musculoskeletal: Bony resorptive changes involving the sacrum,posterior acetabula, bilateral ischial bones. Sclerosis within the left posterior iliac bone, acetabula, and ischial bones consistent with chronic osteomyelitis, these findings do not appear significantly changed. Severe sacral decubitus ulcers. Progressed soft tissue loss at the base of the right penis with possible exposure of a portion of the Foley catheter. I have personally reviewed the films ? Impression/Recommendation ?43 y.o. male with a history of paraplegia, chronic pressure ulcers with osteomyelitis in the past and currently has been doing well l, neurogenic bladder, presented to the ED yesterday as he was worried that he had punctured his bladder when he was self catheterizing and found that urine was leaking from the sacral wound.  3 sacral pressure wounds with chronic osteomyelitis of the underlying bone.    The ulcer on the right buttock is probably close to the base of the penis on the right side.  All 3 look very clean with pink tissue and no evidence of infection or discharge.  Did not probe the wound up to the scrotal base.  But on CT pelvis it is shown that soft tissue loss at the base of the right penis with possible exposure of a portion of the Foley catheter.  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  [facility] the  future for possible surgery if needed ? He likely will not need long-term IV antibiotics. Await urine culture.  We will continue antibiotics IV for a few days currently on IV Vanco and cefepime.  May be able to stop the vancomycin once we have the urine culture report.  Anemia of chronic disease: Has low iron and low ferritin. ? ___________________________________________________ Discussed with patient, and his nurse and the urologist. Note:  This document was prepared using Dragon voice recognition software and may include unintentional dictation errors.

## 2018-11-26 NOTE — Progress Notes (Signed)
Patient defers wound care at this time until urologist looks at sacral wounds. States" I think when I straight cathed myself I punctured something and I think the urine is coming out of the sacral wounds. These are the worst I have ever had"

## 2018-11-26 NOTE — Progress Notes (Signed)
Patient is refusing ALL wound measurements, assessments, and wound dressing changes unless, per patient, he sees the [facility] RN physically, she assesses him in person, and does the first wound dressing change at the bedside. Reasons completely unknown. Also refused to let [place], RN perform any wound measurement, assessment, or first change. Have already informed physician in person and [place] RN via text message. Awaiting possible response from her. Will continue to monitor wound dressing status(es) for the remainder of the shift. Wenda Low [facility]

## 2018-11-26 NOTE — Progress Notes (Signed)
Received call from [place], gave report. All questions answered. Will tube medications PRN. Will transport momentarily. Wenda Low [facility]

## 2018-11-26 NOTE — ED Notes (Signed)
Sacral wounds covered with non adherent dressing and abdominal pads. Pt tolerated well.

## 2018-11-26 NOTE — Progress Notes (Signed)
Advanced care plan. Purpose of the Encounter: CODE STATUS Parties in Attendance: Patient Patient's Decision Capacity: Good Subjective/Patient's story: Jonathon York  is a 43 y.o. 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  He has needed in and out catheterizations for the last 3 years.  He ran out of the supplies 2 months ago.  He rigged a long tube to collect his urine.  He thought he perforated his bladder because urine is been leaking out of his sacral wounds.  He has a fever and he was concerned that he may be septic.  He is having worsening pain and required 3 rounds of IV pain medications in the emergency room.  Urine analysis was found to be positive.  Hospitalist services contacted for further evaluation.  CT scan was negative for bladder rupture.  Positive for chronic osteomyelitis Objective/Medical story Patient needs evaluation and treatment by wound care team.  Needs IV antibiotics and urology evaluation for the urinary leak into the wounds.  Needs aggressive wound care treatment. Goals of care determination:  Advance care directives goals of care treatment plan discussed Patient is full resuscitation CODE STATUS: Full code Time spent discussing advanced care planning: 16 minutes

## 2018-11-26 NOTE — Consult Note (Signed)
Pharmacy Antibiotic Note  Jonathon York is a 43 y.o. male admitted on 11/26/2018 with cellulitis.  Pharmacy has been consulted for cefepime and vancomycin dosing. ID to follow per note.   Plan: Pt received pip/tazo and vancomycin in the ED.   Will order cefepime 2 g q8H   Will start a maintenance of vancomycin 750 mg q12H with a predicted AUC of 515. Goal AUC is 400-550. Scr used 0.8 (rounded up). Used TBW as IBW > TBW. Plan to order level in 4-5 days. Continue to monitor Scr.   Height: 5\' 10"  (177.8 cm) Weight: 115 lb (52.2 kg) IBW/kg (Calculated) : 73  Temp (24hrs), Avg:98.4 F (36.9 C), Min:98 F (36.7 C), Max:98.7 F (37.1 C)  Recent Labs  Lab 11/25/18 2153 11/26/18 0447  WBC 7.1 9.5  CREATININE 0.46* 0.47*  LATICACIDVEN 1.0  --     Estimated Creatinine Clearance: 87.9 mL/min (A) (by C-G formula based on SCr of 0.47 mg/dL (L)).    Allergies  Allergen Reactions  . Orange Fruit [Citrus] Other (See Comments)    blisters    Antimicrobials this admission: 7/30 pip/tazo x 1 7/30 cefepime >>  7/30 vancomycin >>   Dose adjustments this admission: None  Microbiology results: 7/30 BCx: pending 7/30 UCx: pending  7/30 buttocks wound cx: pending 7/30 MRSA PCR: negative  Thank you for allowing pharmacy to be a part of this patient's care.  Oswald Hillock, PharmD, BCPS 11/26/2018 8:38 AM

## 2018-11-26 NOTE — ED Provider Notes (Signed)
[facility] Emergency Department Provider Note   ____________________________________________   First MD Initiated Contact with Patient 11/26/18 [phone]     (approximate)  I have reviewed the triage vital signs and the nursing notes.   HISTORY  Chief Complaint Wound Check    HPI Jonathon York is a 43 y.o. male who presents to the ED from home via EMS with a chief complaint of pain from chronic sacral decubitis ulcers. Patient has a history of para paresis of both lower limbs, neurogenic bladder, chronic sacral decubiti, chronic osteomyelitis who presents with pain in his wounds and feeling a tract in his right buttock.  Also he ran out of in and out cath supplies several months ago and has been using a creation involving a condom cath rigged to a Foley catheter.  He is afraid he perforated his bladder 1 week ago because he has been noticing less urine in the bag and has been noticing wet fluid on his wheelchair.  Complains of chills and right buttock pain.  Denies cough, chest pain, shortness of breath, abdominal pain.        Past Medical History:  Diagnosis Date  . Anxiety   . Decubitus ulcer   . Depression   . Neurogenic bladder   . Osteomyelitis (HCC)   . 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 Intractable pain 11/26/2018  . Anxiety 04/22/2018  . Acute osteomyelitis of right foot (HCC) 03/18/2018  . Pressure injury of skin 03/18/2018  . 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 Protein-calorie malnutrition, severe 11/20/2016  . Herpes simplex infection of penis   . Decubitus ulcer 11/19/2016  . 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 Decubitus ulcer of sacral region   . Sepsis (HCC) 04/15/2016  . 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 Pressure ulcer stage III 04/30/2015  . Sterile pyuria 04/30/2015  .  Paraplegia (HCC) 04/30/2015  . Neurogenic bladder 04/30/2015  . 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 Malnutrition of moderate degree 04/28/2015    Past Surgical History:  Procedure Laterality Date  . AMPUTATION Right 03/19/2018   Procedure: 5th Metatarsal Resection;  Surgeon: Recardo Evangelistroxler, Matthew, DPM;  Location: [facility] ORS;  Service: Podiatry;  Laterality: Right;  . AMPUTATION TOE Right 04/29/2015   Procedure: AMPUTATION TOE;  Surgeon: Linus Galasodd Cline, MD;  Location: [facility] ORS;  Service: Podiatry;  Laterality: Right;  . IRRIGATION AND DEBRIDEMENT BUTTOCKS    . TOE AMPUTATION Right     Prior to Admission medications   Medication Sig Start Date End Date Taking? Authorizing Provider  Multiple Vitamin (MULTIVITAMIN WITH MINERALS) TABS tablet Take 1 tablet by mouth daily. 11/22/16   Altamese DillingVachhani, Vaibhavkumar, MD  pantoprazole (PROTONIX) 40 MG tablet Take 1 tablet (40 mg total) by mouth daily. 04/23/18   Enedina FinnerPatel, Sona, MD    Allergies Orange fruit [citrus]  Family History  Problem Relation Age of Onset  . Lymphoma Sister     Social History Social History   Tobacco Use  . Smoking status: Former Smoker    Packs/day: 0.50    Types: Cigarettes  . Smokeless tobacco: Never Used  Substance Use Topics  . Alcohol use: No  . Drug use: Yes    Types: Marijuana    Comment: 2 days ago    Review of Systems  Constitutional: No fever/chills Eyes: No visual  changes. ENT: No sore throat. Cardiovascular: Denies chest pain. Respiratory: Denies shortness of breath. Gastrointestinal: No abdominal pain.  No nausea, no vomiting.  No diarrhea.  No constipation. Genitourinary: Positive for cloudy urine.  Negative for dysuria. Musculoskeletal: Negative for back pain. Skin: Positive for sacral decubitus wounds.  Negative for rash. Neurological: Negative for headaches, focal weakness or numbness.   ____________________________________________   PHYSICAL EXAM:  VITAL SIGNS: ED Triage Vitals  Enc  Vitals Group     BP 11/25/18 2152 109/81     Pulse Rate 11/25/18 2152 (!) 107     Resp 11/25/18 2152 16     Temp 11/25/18 2152 98.7 F (37.1 C)     Temp Source 11/25/18 2152 Oral     SpO2 11/25/18 2152 100 %     Weight 11/25/18 2144 115 lb (52.2 kg)     Height 11/25/18 2144 5\' 10"  (1.778 m)     Head Circumference --      Peak Flow --      Pain Score 11/25/18 2144 7     Pain Loc --      Pain Edu? --      Excl. in [place]? --     Constitutional: Alert and oriented. Well appearing and in no acute distress. Eyes: Conjunctivae are normal. PERRL. EOMI. Head: Atraumatic. Nose: No congestion/rhinnorhea. Mouth/Throat: Mucous membranes are moist.  Oropharynx non-erythematous. Neck: No stridor.   Cardiovascular: Normal rate, regular rhythm. Grossly normal heart sounds.  Good peripheral circulation. Respiratory: Normal respiratory effort.  No retractions. Lungs CTAB. Gastrointestinal: Soft and nontender. No distention. No abdominal bruits. No CVA tenderness. Genitourinary: Condom cath noted. Buttocks: 3 large sacral decubiti which appear clean.  Left side with white substance ?  bone.  Right side with curvilinear tract. Musculoskeletal: BLE contractures.  No joint effusions. Neurologic:  Normal speech and language. No gross focal neurologic deficits are appreciated to BUE. Skin:  Skin is warm, dry and intact. No rash noted. Psychiatric: Mood and affect are normal. Speech and behavior are normal.  ____________________________________________   LABS (all labs ordered are listed, but only abnormal results are displayed)  Labs Reviewed  COMPREHENSIVE METABOLIC PANEL - Abnormal; Notable for the following components:      Result Value   Glucose, Bld 112 (*)    Creatinine, Ser 0.46 (*)    Calcium 8.8 (*)    Total Protein 8.2 (*)    Albumin 3.3 (*)    AST 11 (*)    All other components within normal limits  CBC WITH DIFFERENTIAL/PLATELET - Abnormal; Notable for the following components:    Hemoglobin 8.4 (*)    HCT 32.4 (*)    MCV 60.7 (*)    MCH 15.7 (*)    MCHC 25.9 (*)    RDW 21.7 (*)    Platelets 587 (*)    All other components within normal limits  URINALYSIS, COMPLETE (UACMP) WITH MICROSCOPIC - Abnormal; Notable for the following components:   Color, Urine YELLOW (*)    APPearance HAZY (*)    Specific Gravity, Urine 1.031 (*)    Leukocytes,Ua MODERATE (*)    WBC, UA >50 (*)    Bacteria, UA MANY (*)    All other components within normal limits  SARS CORONAVIRUS 2 (HOSPITAL ORDER, [place] [facility])  CULTURE, BLOOD (ROUTINE X 2)  CULTURE, BLOOD (ROUTINE X 2)  URINE CULTURE  AEROBIC/ANAEROBIC CULTURE (SURGICAL/DEEP WOUND)  LACTIC ACID, PLASMA   ____________________________________________  EKG  None  ____________________________________________  RADIOLOGY  ED MD interpretation: Advancing sacral decubiti ulcers, no bladder rupture, chronic osteomyelitis.  Official radiology report(s): Ct Abdomen Pelvis W Contrast  Result Date: 11/26/2018 CLINICAL DATA:  Rectal vesicular fistula sacral wounds EXAM: CT ABDOMEN AND PELVIS WITH CONTRAST TECHNIQUE: Multidetector CT imaging of the abdomen and pelvis was performed using the standard protocol following bolus administration of intravenous contrast. CONTRAST:  100mL OMNIPAQUE IOHEXOL 300 MG/ML  SOLN COMPARISON:  04/22/2018, 03/18/2018 FINDINGS: Lower chest: Lung bases demonstrate no acute consolidation or effusion. Heart size is within normal limits. Hepatobiliary: Subcentimeter hypodensity within the right hepatic lobe, too small to further characterize. No calcified gallstone or biliary dilatation Pancreas: Unremarkable. No pancreatic ductal dilatation or surrounding inflammatory changes. Spleen: Normal in size without focal abnormality. Adrenals/Urinary Tract: Adrenal glands are normal. Cyst in the left kidney. No hydronephrosis. Catheter is present within the urinary bladder. The bladder is empty and  thick-walled in appearance with small amount of air. Delayed images of the bladder demonstrate no extravasation to the extra or intraperitoneal soft tissues. Stomach/Bowel: Moderate fluid in the stomach. No dilated small bowel. No bowel wall thickening. Negative appendix. Vascular/Lymphatic: Moderate to marked aortic atherosclerosis without aneurysm. Slightly enlarged left greater than right pelvic sidewall nodes. These measure up to 9 mm on the left and 7 mm on the right and or slightly increased compared to prior. Reproductive: Prostate is unremarkable. Other: Negative for free air or free fluid Musculoskeletal: Bony resorptive changes involving the sacrum, posterior acetabula, bilateral ischial bones. Sclerosis within the left posterior iliac bone, acetabula, and ischial bones consistent with chronic osteomyelitis, these findings do not appear significantly changed. Severe sacral decubitus ulcers. Progressed soft tissue loss at the base of the right penis with possible exposure of a portion of the Foley catheter. IMPRESSION: 1. Advanced sacral decubitus ulcers with progressed soft tissue loss at the base of the right penis. Possible exposure of a portion of the urinary catheter here, recommend correlation with direct inspection. 2. Negative for intra or extraperitoneal bladder rupture. The bladder is severely thick-walled 3. Bony resorptive changes and sclerosis involving the sacrum, posterior acetabula and ischial bones consistent with chronic osteomyelitis, overall unchanged as compared with the prior exam 4. Slight increased pelvic adenopathy Electronically Signed   By: Jasmine PangKim  Fujinaga M.D.   On: 11/26/2018 02:23    ____________________________________________   PROCEDURES  Procedure(s) performed (including Critical Care):  Procedures   ____________________________________________   INITIAL IMPRESSION / ASSESSMENT AND PLAN / ED COURSE  As part of my medical decision making, I reviewed the  following data within the electronic MEDICAL RECORD NUMBER Nursing notes reviewed and incorporated, Labs reviewed, Old chart reviewed, Radiograph reviewed, Discussed with admitting physician and Notes from prior ED visits     Jonathon Leedsichard L Fees was evaluated in Emergency Department on 11/26/2018 for the symptoms described in the history of present illness. He was evaluated in the context of the global COVID-19 pandemic, which necessitated consideration that the patient might be at risk for infection with the SARS-CoV-2 virus that causes COVID-19. Institutional protocols and algorithms that pertain to the evaluation of patients at risk for COVID-19 are in a state of rapid change based on information released by regulatory bodies including the CDC and federal and state organizations. These policies and algorithms were followed during the patient's care in the ED.   43 year old male with paraparesis of both legs coming in with pain in his sacral decubiti ulcers and increased leakage with concerns for perforating his  bladder.  Differential diagnosis includes but is not limited to UTI, fistula, worsening sacral decubiti, bladder perforation, etc.  Although patient is afebrile with negative lactic acid, patient states he felt like this when he was septic previously.  Will obtain CT abdomen/pelvis with delayed phase particularly to evaluate bladder rupture.  Will initiate broad-spectrum IV antibiotics.  Clinical Course as of Nov 26 347  Thu Nov 26, 2018  0348 Updated patient of CT scan results.  Pain has returned; will administer additional IV Dilaudid.  Discussed with hospitalist Dr. Hilton SinclairWeiting who will evaluate patient in the emergency department for admission.  Patient would benefit from wound care, urology and case management consults.   [JS]    Clinical Course User Index [JS] Irean HongSung, Jade J, MD     ____________________________________________   FINAL CLINICAL IMPRESSION(S) / ED DIAGNOSES  Final diagnoses:   Urinary tract infection without hematuria, site unspecified  Sacral decubitus ulcer, stage IV (HCC)  Chronic osteomyelitis (HCC)  Intractable pain     ED Discharge Orders    None       Note:  This document was prepared using Dragon voice recognition software and may include unintentional dictation errors.   Irean HongSung, Jade J, MD 11/26/18 [phone]

## 2018-11-26 NOTE — Progress Notes (Signed)
Initial Nutrition Assessment  DOCUMENTATION CODES:   Underweight  INTERVENTION:   Ensure Enlive po TID, each supplement provides 350 kcal and 20 grams of protein  MVI daily   Ocuvite daily for wound healing (provides zinc, vitamin A, vitamin C, Vitamin E, copper, and selenium)  Vitamin C 250mg  po BID  NUTRITION DIAGNOSIS:   Increased nutrient needs related to wound healing as evidenced by increased estimated needs.  GOAL:   Patient will meet greater than or equal to 90% of their needs  MONITOR:   PO intake, Supplement acceptance, Weight trends, Labs, Skin, I & O's  REASON FOR ASSESSMENT:   Malnutrition Screening Tool    ASSESSMENT:   43 year old male patient with history of paraplegia, chronic osteomyelitis, sacral decubitus ulcer currently under hospitalist service for wound treatment   RD working remotely.  Spoke with pt via phone. Pt reports good appetite and oral intake at baseline. Pt reports his appetite was decreased for about 2 days pta but has now returned and pt is eating 100% of meals. RD discussed with patient the importance of adequate nutrition needed for wound healing. Pt is willing to drink Ensure supplements. RD will add supplements and vitamins to support wound healing. Pt does not like Juven as he has tried this before. Recommend continue vitamins and supplements after discharge. Per chart, pt with weight gain pta.   Of note, pt is noted to have iron deficiency; would recommend po supplementation.   Pt at high risk for malnutrition but unable to diagnose at this time as NFPE cannot be performed.   Medications reviewed and include: MVI, cefepime, vancomycin   Labs reviewed: Iron 9(L), TIBC 257, ferritin 15(L) Hgb 7.6(L), Hct 28.9(L), MCV 60.3(L), MCHC 26.3(L)  Unable to complete Nutrition-Focused physical exam at this time.   Diet Order:   Diet Order            Diet regular Room service appropriate? Yes; Fluid consistency: Thin  Diet effective  now             EDUCATION NEEDS:   Education needs have been addressed  Skin:  Skin Assessment: Reviewed RN Assessment(multiple Stage III coccyx and buttocks wounds)  Last BM:  7/29  Height:   Ht Readings from Last 1 Encounters:  11/25/18 5\' 10"  (1.778 m)    Weight:   Wt Readings from Last 1 Encounters:  11/25/18 52.2 kg    Ideal Body Weight:  75 kg  BMI:  Body mass index is 16.5 kg/m.  Estimated Nutritional Needs:   Kcal:  1800-2100kcal/day  Protein:  90-105g/day  Fluid:  >1.6L/day  Koleen Distance MS, RD, LDN Pager #- [phone] Office#- [phone] After Hours Pager: [phone]

## 2018-11-26 NOTE — Consult Note (Addendum)
11/26/18 4:12 PM   Jonathon York Dec 08, 1975 [id number]  CC: Decubitus ulcers with possible urethral erosion  HPI: I saw Mr. Jonathon York in consultation for possible urethral erosion from Dr. Renae GlossWieting.  He is a 43 year old male with distant history of paraplegia secondary to a fall who was admitted overnight with pelvic pain, possible UTI, and patient concern for bladder injury.  There are no outside urology records for me to review, and the history is obtained completely from the patient.  He has a history of neurogenic bladder, and was originally managed with oxybutynin.  It sounds like at some point 3 to 4 years ago he developed severe and almost constant urge incontinence, and it was decided to manage his bladder with a chronic Foley catheter.  This was changed on a monthly basis for around 3 years by the patient himself.  A few months ago he apparently could no longer obtain Foley catheters, and was ordering intermittent catheters online.  He would place the intermittent catheter and tape it to the penis, then changed on a daily basis.  He feels this was working well for a few months, however over the last few weeks he noted urine in his chronic decubitus wounds, and minimal urine drainage in the catheters.  He was also having some pain and he presented today for further work-up.  He denies any fevers, chills, gross hematuria, or flank pain.  He denies a history of recurrent urinary tract infections.   On arrival in the ED, a Foley catheter was placed by ED nurse.  Urinalysis on admission showed many bacteria, greater than 50 WBCs, 6-10 RBCs, nitrite negative, moderate leukocytes.   CT abdomen pelvis with contrast today showed no hydronephrosis or urolithiasis.  Foley was in appropriate position in the decompressed bladder, however there were advanced sacral decubitus ulcers with progress soft tissue loss and likely exposure of a portion of the proximal urethral catheter.  Delayed films showed no  extravasation of contrast in the pelvis.  The Foley catheter has been draining clear yellow urine since admission.  PMH: Past Medical History:  Diagnosis Date  . Anxiety   . Decubitus ulcer   . Depression   . Neurogenic bladder   . Osteomyelitis (HCC)   . Paraparesis of both lower limbs (HCC) 02/21/00    Surgical History: Past Surgical History:  Procedure Laterality Date  . AMPUTATION Right 03/19/2018   Procedure: 5th Metatarsal Resection;  Surgeon: Recardo Evangelistroxler, Matthew, DPM;  Location: [facility] ORS;  Service: Podiatry;  Laterality: Right;  . AMPUTATION TOE Right 04/29/2015   Procedure: AMPUTATION TOE;  Surgeon: Linus Galasodd Cline, MD;  Location: [facility] ORS;  Service: Podiatry;  Laterality: Right;  . IRRIGATION AND DEBRIDEMENT BUTTOCKS    . TOE AMPUTATION Right       Allergies:  Allergies  Allergen Reactions  . Orange Fruit [Citrus] Other (See Comments)    blisters    Family History: Family History  Problem Relation Age of Onset  . Lymphoma Sister     Social History:  reports that he has quit smoking. His smoking use included cigarettes. He smoked 0.50 packs per day. He has never used smokeless tobacco. He reports current drug use. Drug: Marijuana. He reports that he does not drink alcohol.  ROS: Please see flowsheet from today's date for complete review of systems.  Physical Exam: BP 104/74 (BP Location: Right Arm)   Pulse 70   Temp 97.6 F (36.4 C) (Oral)   Resp 18   Ht 5\' 10"  (  1.778 m)   Wt 52.2 kg   SpO2 97%   BMI 16.50 kg/m    Constitutional:  Alert and oriented, No acute distress. Cardiovascular: No clubbing, cyanosis, or edema. Respiratory: Normal respiratory effort, no increased work of breathing. GI: Abdomen is soft, nontender, nondistended, no abdominal masses GU: Circumcised phallus without lesions.  Normal scrotum with no lesions.  Urethral meatus patent with no erosion. Decubitus ulcers posteriorly, no frank purulence.  Unable to visualize Foley catheter  through sacral wounds. Foley draining clear yellow urine Lymph: No cervical or inguinal lymphadenopathy.   Laboratory Data: Reviewed  Urinalysis many bacteria, >50 WBCs, moderate leukocytes, nitrite negative Culture pending  Pertinent Imaging: I have personally reviewed the CT abdomen pelvis with contrast dated 11/26/18: No hydro or urolithiasis, foley catheter in bladder, chronic osteomyelitis stable, advanced sacral decubitus ulcers with progressed soft tissue loss  Assessment & Plan:   In summary, the patient is a complex 43 year old male with longstanding paraplegia managed with chronic Foley catheter that he changes himself on a monthly basis.  Unfortunately, it sounds like he ran out of indwelling Foley catheters over the last few months and was unable to obtain these.  Over that period of time he has been managing his bladder by placing a clean intermittent catheter into the bladder and taping it to the penis, and changing this on a daily basis.  Over the last few weeks he has noted urine in his sacral wounds consistent with likely urethral erosion posteriorly.  We discussed management options at length, and I strongly recommended referral to a tertiary care center for evaluation by reconstructive urologist and likely plastic surgery for consideration of definitive repair in the future.  I discussed they would likely perform a retrograde urethrogram to evaluate the extent of urethral erosion, cystoscopy, and possibly a cystogram.  Other options would be chronic Foley changes on a monthly basis, with low likelihood of healing spontaneously or suprapubic tube. Other long term bladder management options would include SP tube or urinary diversion.  -Will place referral to reconstructive urologist at [facility] and plastic surgery for consideration of definitive repair in the future, SP tube, or urinary diversion -Consider 10-day course of antibiotics for possible UTI, follow-up culture.  Urine will  always look infected in the setting of an indwelling Foley. Faythe Ghee for discharge from urology perspective   [place], [facility] [address] [place], [place] [address] [phone]

## 2018-11-26 NOTE — ED Notes (Addendum)
ED TO INPATIENT HANDOFF REPORT  ED Nurse Name and Phone #: Geraldine ContrasDee [phone]  S Name/Age/Gender Winfred Leedsichard L Furtick 43 y.o. male Room/Bed: ED15A/ED15A  Code Status   Code Status: Prior  Home/SNF/Other Home Patient oriented to: self, place, time and situation Is this baseline? Yes   Triage Complete: Triage complete  Chief Complaint wound check  Triage Note Pt to ED with worsening sacral wounds. Pt reports they have started weeping more and more and the odor is similar to urine. Pt has also been self-cathing and reports fears that he has punctured his bladder. Decreased urine output with catheters and pt has noted increased on pts wheelchair cushion. Pt has not been seen by wound care in over a year.   Pt reports his sacral wounds have turned septic multiple times in the past and pt is starting to feel weaker.    Allergies Allergies  Allergen Reactions  . Orange Fruit [Citrus] Other (See Comments)    blisters    Level of Care/Admitting Diagnosis ED Disposition    ED Disposition Condition Comment   Admit  [facility] [[id number]]  Level of Care: Med-Surg [16]  Covid Evaluation: Confirmed COVID Negative  Diagnosis: Intractable pain [708987]  Admitting Physician: Alford HighlandWIETING, [place] [[id number]]  Attending Physician: Alford HighlandWIETING, Frankey [phone][[id number]]  Estimated length of stay: past midnight tomorrow  Certification:: I certify this patient will need inpatient services for at least 2 midnights  PT Class (Do Not Modify): Inpatient [101]  PT Acc Code (Do Not Modify): Private [1]       B Medical/Surgery History Past Medical History:  Diagnosis Date  . Anxiety   . Decubitus ulcer   . Depression   . Neurogenic bladder   . Osteomyelitis (HCC)   . 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 AMPUTATION Right 03/19/2018   Procedure: 5th Metatarsal Resection;  Surgeon: Recardo Evangelistroxler, Matthew, DPM;  Location:  [facility] ORS;  Service: Podiatry;  Laterality: Right;  . AMPUTATION TOE Right 04/29/2015   Procedure: AMPUTATION TOE;  Surgeon: Linus Galasodd Cline, MD;  Location: [facility] ORS;  Service: Podiatry;  Laterality: Right;  . IRRIGATION AND DEBRIDEMENT BUTTOCKS    . TOE AMPUTATION Right      A IV Location/Drains/Wounds Patient Lines/Drains/Airways Status   Active Line/Drains/Airways    Name:   Placement date:   Placement time:   Site:   Days:   Peripheral IV 11/26/18 Left Antecubital   11/26/18    0132    Antecubital   less than 1   Urethral Catheter self Latex   03/12/18    1258    Latex   259   Incision (Closed) 03/19/18 Foot Right   03/19/18    1458     252   Pressure Injury 02/14/17 Stage III -  Full thickness tissue loss. Subcutaneous fat may be visible but bone, tendon or muscle are NOT exposed.   02/14/17    2342     650   Pressure Injury 02/14/17 Stage III -  Full thickness tissue loss. Subcutaneous fat may be visible but bone, tendon or muscle are NOT exposed.   02/14/17    2342     650   Pressure Injury 02/14/17 Stage III -  Full thickness tissue loss. Subcutaneous fat may be visible but bone, tendon or muscle are NOT exposed.   02/14/17    2342     650   Pressure Injury Stage III -  Full thickness tissue loss. Subcutaneous fat may be visible but bone, tendon or muscle are NOT exposed. Pink   -    -        Pressure Injury 04/23/18 Stage III -  Full thickness tissue loss. Subcutaneous fat may be visible but bone, tendon or muscle are NOT exposed.   04/23/18    0113     217   Pressure Injury 04/23/18 Stage III -  Full thickness tissue loss. Subcutaneous fat may be visible but bone, tendon or muscle are NOT exposed.   04/23/18    0116     217   Pressure Injury 04/23/18 Stage IV - Full thickness tissue loss with exposed bone, tendon or muscle.   04/23/18    0116     217          Intake/Output Last 24 hours No intake or output data in the 24 hours ending 11/26/18 0348  Labs/Imaging Results for orders  placed or performed during the hospital encounter of 11/26/18 (from the past 48 hour(s))  Lactic acid, plasma     Status: None   Collection Time: 11/25/18  9:53 PM  Result Value Ref Range   Lactic Acid, Venous 1.0 0.5 - 1.9 mmol/L    Comment: Performed at [facility], [address] [place], [place] [address]  Comprehensive metabolic panel     Status: Abnormal   Collection Time: 11/25/18  9:53 PM  Result Value Ref Range   Sodium 139 135 - 145 mmol/L   Potassium 3.6 3.5 - 5.1 mmol/L   Chloride 105 98 - 111 mmol/L   CO2 25 22 - 32 mmol/L   Glucose, Bld 112 (H) 70 - 99 mg/dL   BUN 8 6 - 20 mg/dL   Creatinine, Ser 0.46 (L) 0.61 - 1.24 mg/dL   Calcium 8.8 (L) 8.9 - 10.3 mg/dL   Total Protein 8.2 (H) 6.5 - 8.1 g/dL   Albumin 3.3 (L) 3.5 - 5.0 g/dL   AST 11 (L) 15 - 41 U/L   ALT 8 0 - 44 U/L   Alkaline Phosphatase 78 38 - 126 U/L   Total Bilirubin 0.4 0.3 - 1.2 mg/dL   GFR calc non Af Amer >60 >60 mL/min   GFR calc Af Amer >60 >60 mL/min   Anion gap 9 5 - 15    Comment: Performed at [facility], [address] [place], [place] [address]  CBC with Differential     Status: Abnormal   Collection Time: 11/25/18  9:53 PM  Result Value Ref Range   WBC 7.1 4.0 - 10.5 K/uL   RBC 5.34 4.22 - 5.81 MIL/uL   Hemoglobin 8.4 (L) 13.0 - 17.0 g/dL    Comment: Reticulocyte Hemoglobin testing may be clinically indicated, consider ordering this additional test [id number]    HCT 32.4 (L) 39.0 - 52.0 %   MCV 60.7 (L) 80.0 - 100.0 fL   MCH 15.7 (L) 26.0 - 34.0 pg   MCHC 25.9 (L) 30.0 - 36.0 g/dL   RDW 21.7 (H) 11.5 - 15.5 %   Platelets 587 (H) 150 - 400 K/uL   nRBC 0.0 0.0 - 0.2 %   Neutrophils Relative % 70 %   Neutro Abs 4.9 1.7 - 7.7 K/uL   Lymphocytes Relative 24 %   Lymphs Abs 1.7 0.7 - 4.0 K/uL   Monocytes Relative 4 %   Monocytes Absolute 0.3 0.1 - 1.0 K/uL   Eosinophils Relative 1 %  Eosinophils Absolute 0.1 0.0 - 0.5 K/uL   Basophils Relative 1 %   Basophils  Absolute 0.1 0.0 - 0.1 K/uL   Immature Granulocytes 0 %   Abs Immature Granulocytes 0.03 0.00 - 0.07 K/uL   [place] PRESENT     Comment: Performed at [facility], [address], [place], [place] [address]  SARS Coronavirus 2 (CEPHEID - Performed in [facility] hospital lab), Hosp Order     Status: None   Collection Time: 11/26/18  1:30 AM   Specimen: Buttocks; Nasopharyngeal  Result Value Ref Range   SARS Coronavirus 2 NEGATIVE NEGATIVE    Comment: (NOTE) If result is NEGATIVE SARS-CoV-2 target nucleic acids are NOT DETECTED. The SARS-CoV-2 RNA is generally detectable in upper and lower  respiratory specimens during the acute phase of infection. The lowest  concentration of SARS-CoV-2 viral copies this assay can detect is 250  copies / mL. A negative result does not preclude SARS-CoV-2 infection  and should not be used as the sole basis for treatment or other  patient management decisions.  A negative result may occur with  improper specimen collection / handling, submission of specimen other  than nasopharyngeal swab, presence of viral mutation(s) within the  areas targeted by this assay, and inadequate number of viral copies  (<250 copies / mL). A negative result must be combined with clinical  observations, patient history, and epidemiological information. If result is POSITIVE SARS-CoV-2 target nucleic acids are DETECTED. The SARS-CoV-2 RNA is generally detectable in upper and lower  respiratory specimens dur ing the acute phase of infection.  Positive  results are indicative of active infection with SARS-CoV-2.  Clinical  correlation with patient history and other diagnostic information is  necessary to determine patient infection status.  Positive results do  not rule out bacterial infection or co-infection with other viruses. If result is PRESUMPTIVE POSTIVE SARS-CoV-2 nucleic acids MAY BE PRESENT.   A presumptive positive result was obtained on the submitted  specimen  and confirmed on repeat testing.  While 2019 novel coronavirus  (SARS-CoV-2) nucleic acids may be present in the submitted sample  additional confirmatory testing may be necessary for epidemiological  and / or clinical management purposes  to differentiate between  SARS-CoV-2 and other Sarbecovirus currently known to infect humans.  If clinically indicated additional testing with an alternate test  methodology [phone]) is advised. The SARS-CoV-2 RNA is generally  detectable in upper and lower respiratory sp ecimens during the acute  phase of infection. The expected result is Negative. Fact Sheet for Patients:  BoilerBrush.com.cy Fact Sheet for Healthcare Providers: https://pope.com/ This test is not yet approved or cleared by the [place] FDA and has been authorized for detection and/or diagnosis of SARS-CoV-2 by FDA under an Emergency Use Authorization (EUA).  This EUA will remain in effect (meaning this test can be used) for the duration of the COVID-19 declaration under Section 564(b)(1) of the Act, 21 U.S.C. section 360bbb-3(b)(1), unless the authorization is terminated or revoked sooner. Performed at [facility], [address] [place], [place] [address]   Urinalysis, Complete w Microscopic     Status: Abnormal   Collection Time: 11/26/18  2:15 AM  Result Value Ref Range   Color, Urine YELLOW (A) YELLOW   APPearance HAZY (A) CLEAR   Specific Gravity, Urine 1.031 (H) 1.005 - 1.030   pH 6.0 5.0 - 8.0   Glucose, UA NEGATIVE NEGATIVE mg/dL   Hgb urine dipstick NEGATIVE NEGATIVE   Bilirubin  Urine NEGATIVE NEGATIVE   Ketones, ur NEGATIVE NEGATIVE mg/dL   Protein, ur NEGATIVE NEGATIVE mg/dL   Nitrite NEGATIVE NEGATIVE   Leukocytes,Ua MODERATE (A) NEGATIVE   RBC / HPF 6-10 0 - 5 RBC/hpf   WBC, UA >50 (H) 0 - 5 WBC/hpf   Bacteria, UA MANY (A) NONE SEEN   Squamous Epithelial / LPF NONE SEEN 0 - 5   Mucus  PRESENT     Comment: Performed at [facility], [address] [place], [place] [address]   Ct Abdomen Pelvis W Contrast  Result Date: 11/26/2018 CLINICAL DATA:  Rectal vesicular fistula sacral wounds EXAM: CT ABDOMEN AND PELVIS WITH CONTRAST TECHNIQUE: Multidetector CT imaging of the abdomen and pelvis was performed using the standard protocol following bolus administration of intravenous contrast. CONTRAST:  100mL OMNIPAQUE IOHEXOL 300 MG/ML  SOLN COMPARISON:  04/22/2018, 03/18/2018 FINDINGS: Lower chest: Lung bases demonstrate no acute consolidation or effusion. Heart size is within normal limits. Hepatobiliary: Subcentimeter hypodensity within the right hepatic lobe, too small to further characterize. No calcified gallstone or biliary dilatation Pancreas: Unremarkable. No pancreatic ductal dilatation or surrounding inflammatory changes. Spleen: Normal in size without focal abnormality. Adrenals/Urinary Tract: Adrenal glands are normal. Cyst in the left kidney. No hydronephrosis. Catheter is present within the urinary bladder. The bladder is empty and thick-walled in appearance with small amount of air. Delayed images of the bladder demonstrate no extravasation to the extra or intraperitoneal soft tissues. Stomach/Bowel: Moderate fluid in the stomach. No dilated small bowel. No bowel wall thickening. Negative appendix. Vascular/Lymphatic: Moderate to marked aortic atherosclerosis without aneurysm. Slightly enlarged left greater than right pelvic sidewall nodes. These measure up to 9 mm on the left and 7 mm on the right and or slightly increased compared to prior. Reproductive: Prostate is unremarkable. Other: Negative for free air or free fluid Musculoskeletal: Bony resorptive changes involving the sacrum, posterior acetabula, bilateral ischial bones. Sclerosis within the left posterior iliac bone, acetabula, and ischial bones consistent with chronic osteomyelitis, these findings do not appear  significantly changed. Severe sacral decubitus ulcers. Progressed soft tissue loss at the base of the right penis with possible exposure of a portion of the Foley catheter. IMPRESSION: 1. Advanced sacral decubitus ulcers with progressed soft tissue loss at the base of the right penis. Possible exposure of a portion of the urinary catheter here, recommend correlation with direct inspection. 2. Negative for intra or extraperitoneal bladder rupture. The bladder is severely thick-walled 3. Bony resorptive changes and sclerosis involving the sacrum, posterior acetabula and ischial bones consistent with chronic osteomyelitis, overall unchanged as compared with the prior exam 4. Slight increased pelvic adenopathy Electronically Signed   By: Jasmine PangKim  Fujinaga M.D.   On: 11/26/2018 02:23    Pending Labs Unresulted Labs (From admission, onward)    Start     Ordered   11/26/18 0116  Aerobic/Anaerobic Culture (surgical/deep wound)  Once,   STAT    Question:  Patient immune status  Answer:  Normal   11/26/18 0115   11/26/18 0114  Culture, blood (routine x 2)  BLOOD CULTURE X 2,   STAT     11/26/18 0114   11/26/18 0114  Urine culture  Once,   STAT     11/26/18 0114          Vitals/Pain Today's Vitals   11/25/18 2144 11/25/18 2152 11/26/18 0119 11/26/18 0220  BP:  109/81 122/69 108/65  Pulse:  (!) 107 (!) 101 89  Resp:  16  Temp:  98.7 F (37.1 C)    TempSrc:  Oral    SpO2:  100% 100% 100%  Weight: 52.2 kg     Height: 5\' 10"  (1.778 m)     PainSc: 7        Isolation Precautions No active isolations  Medications Medications  vancomycin (VANCOCIN) IVPB 1000 mg/200 mL premix (1,000 mg Intravenous New Bag/Given 11/26/18 0319)  HYDROmorphone (DILAUDID) injection 1 mg (has no administration in time range)  HYDROmorphone (DILAUDID) injection 0.5 mg (0.5 mg Intravenous Given 11/26/18 0127)  iohexol (OMNIPAQUE) 300 MG/ML solution 100 mL (100 mLs Intravenous Contrast Given 11/26/18 0135)   piperacillin-tazobactam (ZOSYN) IVPB 3.375 g (0 g Intravenous Stopped 11/26/18 0320)    Mobility manual wheelchair Low fall risk   Focused Assessments    R Recommendations: See Admitting Provider Note  Report given to: Lexi, RN

## 2018-11-26 NOTE — Plan of Care (Signed)
  Problem: Health Behavior/Discharge Planning: Goal: Ability to manage health-related needs will improve Outcome: Not Progressing Note: Patient refusing all wound care / assessment / measurement today, for unknown reasons. Also refusing continuing care + cleaning of urethral / Foley area by NT's. Transfer order activated to either 1 or 2C. Will continue to monitor. Wenda Low [facility]

## 2018-11-27 ENCOUNTER — Telehealth: Payer: Self-pay | Admitting: Urology

## 2018-11-27 DIAGNOSIS — B9620 Unspecified Escherichia coli [E. coli] as the cause of diseases classified elsewhere: Secondary | ICD-10-CM

## 2018-11-27 DIAGNOSIS — L89319 Pressure ulcer of right buttock, unspecified stage: Secondary | ICD-10-CM

## 2018-11-27 DIAGNOSIS — N390 Urinary tract infection, site not specified: Secondary | ICD-10-CM

## 2018-11-27 MED ORDER — SODIUM CHLORIDE 0.9 % IV SOLN
2.0000 g | Freq: Two times a day (BID) | INTRAVENOUS | Status: DC
  Filled 2018-11-27 (×2): qty 2

## 2018-11-27 MED ORDER — SODIUM CHLORIDE 0.9 % IV SOLN
510.0000 mg | Freq: Once | INTRAVENOUS | Status: AC
  Administered 2018-11-27: 510 mg via INTRAVENOUS
  Filled 2018-11-27: qty 17

## 2018-11-27 MED ORDER — SODIUM CHLORIDE 0.9 % IV SOLN
1.0000 g | INTRAVENOUS | Status: DC
  Administered 2018-11-27: 1 g via INTRAVENOUS
  Filled 2018-11-27: qty 1
  Filled 2018-11-27: qty 10

## 2018-11-27 NOTE — Telephone Encounter (Signed)
Referral faxed

## 2018-11-27 NOTE — TOC Initial Note (Addendum)
Transition of Care [facility]) - Initial/Assessment Note    Patient Details  Name: Jonathon York MRN: [id number] Date of Birth: 01/05/1976  [facility] CM/SW Contact:    Candie Chroman, LCSW Phone Number: [phone] 11/27/2018, 12:19 PM  Clinical Narrative:  CSW met with patient, introduced role, and explained that discharge planning would be discussed. Patient confirmed interest in SNF placement for wound care. Patient lives home alone and has found it increasingly difficult to manage his own wound care needs. He has been to SNF in the past. Last time was [facility] in 2018. He stated he has been to [facility] in the past and really liked a physician there but their admissions coordinator stated he is not there now. CSW provided patient with list of CMS scores for facilities within 25 miles of his zip code. PASARR under manual review. No further concerns. CSW encouraged patient to contact CSW as needed. CSW will continue to follow patient for support and facilitate discharge to SNF once medically stable.         2:40 pm: 30-day note in front of chart for MD to sign. Sent MD a message to notify.         Expected Discharge Plan: Skilled Nursing Facility Barriers to Discharge: Continued Medical Work up   Patient Goals and CMS Choice   CMS Medicare.gov Compare Post Acute Care list provided to:: Patient    Expected Discharge Plan and Services Expected Discharge Plan: [place] Choice: [place] arrangements for the past 2 months: Single Family Home                                      Prior Living Arrangements/Services Living arrangements for the past 2 months: Single Family Home Lives with:: Self Patient language and need for interpreter reviewed:: Yes Do you feel safe going back to the place where you live?: Yes      Need for Family Participation in Patient Care: Yes (Comment) Care  giver support system in place?: No (comment) Current home services: DME Criminal Activity/Legal Involvement Pertinent to Current Situation/Hospitalization: No - Comment as needed  Activities of Daily Living Home Assistive Devices/Equipment: Wheelchair, Shower chair with back ADL Screening (condition at time of admission) Patient's cognitive ability adequate to safely complete daily activities?: Yes Is the patient deaf or have difficulty hearing?: No Does the patient have difficulty seeing, even when wearing glasses/contacts?: No Does the patient have difficulty concentrating, remembering, or making decisions?: No Patient able to express need for assistance with ADLs?: Yes Does the patient have difficulty dressing or bathing?: No Independently performs ADLs?: Yes (appropriate for developmental age) Does the patient have difficulty walking or climbing stairs?: Yes Weakness of Legs: Both Weakness of Arms/Hands: None  Permission Sought/Granted Permission sought to share information with : Facility Art therapist granted to share information with : Yes, Verbal Permission Granted     Permission granted to share info w AGENCY: SNF's        Emotional Assessment Appearance:: Appears stated age Attitude/Demeanor/Rapport: Engaged, Gracious Affect (typically observed): Accepting, Appropriate, Calm, Pleasant Orientation: : Oriented to Self, Oriented to Place, Oriented to  Time, Oriented to Situation Alcohol / Substance Use: Never Used Psych Involvement: No (comment)  Admission diagnosis:  Intractable pain [R52] Sacral decubitus ulcer, stage IV (HCC) [L89.154] Chronic osteomyelitis ([place]) [  M86.60] Urinary tract infection without hematuria, site unspecified [N39.0] Patient Active Problem List   Diagnosis Date Noted  . Intractable pain 11/26/2018  . Anxiety 04/22/2018  . Acute osteomyelitis of right foot ([place]) 03/18/2018  . Pressure injury of skin 03/18/2018  . 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 Protein-calorie malnutrition, severe 11/20/2016  . Herpes simplex infection of penis   . Decubitus ulcer 11/19/2016  . 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 Decubitus ulcer of sacral region   . Sepsis ([place]) 04/15/2016  . 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 Pressure ulcer stage III 04/30/2015  . Sterile pyuria 04/30/2015  . Paraplegia ([place]) 04/30/2015  . Neurogenic bladder 04/30/2015  . Toe osteomyelitis, right ([place]) 04/28/2015  . Malnutrition of moderate degree 04/28/2015   PCP:  [facility]:   [facility] [address], [place] [address] Phone: [phone] Fax: [phone]     Social Determinants of Health (SDOH) Interventions    Readmission Risk Interventions No flowsheet data found.

## 2018-11-27 NOTE — Progress Notes (Signed)
0600 medications not available, requested from pharmacy, as of 0650 medication not available.  Moved 0600 Vancomycin and Maxipime to 0745 and reported delay to oncoming RN.

## 2018-11-27 NOTE — Progress Notes (Signed)
MEDICATION RELATED CONSULT NOTE - FOLLOW UP   Pharmacy Consult for iron infusion  Indication: iron deficiency  Allergies  Allergen Reactions  . Orange Fruit [Citrus] Other (See Comments)    blisters    Patient Measurements: Height: 5\' 10"  (177.8 cm) Weight: 115 lb (52.2 kg) IBW/kg (Calculated) : 73   Vital Signs: Temp: 97.8 F (36.6 C) (07/31 0516) Temp Source: Oral (07/31 0516) BP: 96/66 (07/31 0516) Pulse Rate: 74 (07/31 0516) Intake/Output from previous day: 07/30 0701 - 07/31 0700 In: 582.3 [P.O.:480; IV Piggyback:102.3] Out: 400 [Urine:400] Intake/Output from this shift: Total I/O In: 240 [P.O.:240] Out: -   Labs: Recent Labs    11/25/18 2153 11/26/18 0447  WBC 7.1 9.5  HGB 8.4* 7.6*  HCT 32.4* 28.9*  PLT 587* 520*  CREATININE 0.46* 0.47*  ALBUMIN 3.3*  --   PROT 8.2*  --   AST 11*  --   ALT 8  --   ALKPHOS 78  --   BILITOT 0.4  --    Estimated Creatinine Clearance: 87.9 mL/min (A) (by C-G formula based on SCr of 0.47 mg/dL (L)).    Assessment: 43 yo male with low iron and low ferritin. MD requesting IV iron infusion.   Plan:  Feraheme 510 mg IV x1 dose.   Pharmacy will continue to follow.   Rocky Morel 11/27/2018,11:56 AM

## 2018-11-27 NOTE — Telephone Encounter (Signed)
-----   Message from Billey Co, MD sent at 11/27/2018  2:57 PM EDT ----- Regarding: outpatient referral Can you place a referral for this patient to [facility] urology for evaluation by their reconstructive urologist for urethral erosion and neurogenic bladder? Thanks  Nickolas Madrid, MD 11/27/2018

## 2018-11-27 NOTE — Progress Notes (Signed)
[facility] [place] NAME: Jonathon York    MR#:  [id number]  DATE OF BIRTH:  1975/07/08  SUBJECTIVE:  CHIEF COMPLAINT:   Chief Complaint  Patient presents with   Wound Check  Patient seen and evaluated today Has buttock wounds Has paraplegia No complaints of chest pain, shortness of breath No fever  REVIEW OF SYSTEMS:    ROS  CONSTITUTIONAL: No documented fever. Has fatigue, weakness. No weight gain, no weight loss.  EYES: No blurry or double vision.  ENT: No tinnitus. No postnasal drip. No redness of the oropharynx.  RESPIRATORY: No cough, no wheeze, no hemoptysis. No dyspnea.  CARDIOVASCULAR: No chest pain. No orthopnea. No palpitations. No syncope.  GASTROINTESTINAL: No nausea, no vomiting or diarrhea. No abdominal pain. No melena or hematochezia.  GENITOURINARY: No dysuria or hematuria.  ENDOCRINE: No polyuria or nocturia. No heat or cold intolerance.  HEMATOLOGY: No anemia. No bruising. No bleeding.  INTEGUMENTARY: Has buttock wounds.  MUSCULOSKELETAL: No arthritis. No swelling. No gout.  NEUROLOGIC: No numbness, tingling, or ataxia. No seizure-type activity.  PSYCHIATRIC: No anxiety. No insomnia. No ADD.   DRUG ALLERGIES:   Allergies  Allergen Reactions   Orange Fruit [Citrus] Other (See Comments)    blisters    VITALS:  Blood pressure 96/66, pulse 74, temperature 97.8 F (36.6 C), temperature source Oral, resp. rate 18, height 5\' 10"  (1.778 m), weight 52.2 kg, SpO2 99 %.  PHYSICAL EXAMINATION:   Physical Exam  GENERAL:  43 y.o.-year-old patient lying in the bed with no acute distress.  EYES: Pupils equal, round, reactive to light and accommodation. No scleral icterus. Extraocular muscles intact.  HEENT: Head atraumatic, normocephalic. Oropharynx and nasopharynx clear.  NECK:  Supple, no jugular venous distention. No thyroid enlargement, no tenderness.  LUNGS: Normal breath sounds bilaterally, no wheezing,  rales, rhonchi. No use of accessory muscles of respiration.  CARDIOVASCULAR: S1, S2 normal. No murmurs, rubs, or gallops.  ABDOMEN: Soft, nontender, nondistended. Bowel sounds present. No organomegaly or mass.  EXTREMITIES: No cyanosis, clubbing or edema b/l.    NEUROLOGIC: Cranial nerves II through XII are intact. Motor strength 5/ 5 upper extremities Paraplegia PSYCHIATRIC: The patient is alert and oriented x 3.  SKIN: Decubitus ulcers noted    LABORATORY PANEL:   CBC Recent Labs  Lab 11/26/18 0447  WBC 9.5  HGB 7.6*  HCT 28.9*  PLT 520*   ------------------------------------------------------------------------------------------------------------------ Chemistries  Recent Labs  Lab 11/25/18 2153 11/26/18 0447  NA 139 138  K 3.6 3.7  CL 105 106  CO2 25 25  GLUCOSE 112* 109*  BUN 8 11  CREATININE 0.46* 0.47*  CALCIUM 8.8* 8.4*  AST 11*  --   ALT 8  --   ALKPHOS 78  --   BILITOT 0.4  --    ------------------------------------------------------------------------------------------------------------------  Cardiac Enzymes No results for input(s): TROPONINI in the last 168 hours. ------------------------------------------------------------------------------------------------------------------  RADIOLOGY:  Ct Abdomen Pelvis W Contrast  Result Date: 11/26/2018 CLINICAL DATA:  Rectal vesicular fistula sacral wounds EXAM: CT ABDOMEN AND PELVIS WITH CONTRAST TECHNIQUE: Multidetector CT imaging of the abdomen and pelvis was performed using the standard protocol following bolus administration of intravenous contrast. CONTRAST:  154mL OMNIPAQUE IOHEXOL 300 MG/ML  SOLN COMPARISON:  04/22/2018, 03/18/2018 FINDINGS: Lower chest: Lung bases demonstrate no acute consolidation or effusion. Heart size is within normal limits. Hepatobiliary: Subcentimeter hypodensity within the right hepatic lobe, too small to further characterize. No calcified gallstone or biliary dilatation Pancreas:  Unremarkable. No pancreatic ductal dilatation or surrounding inflammatory changes. Spleen: Normal in size without focal abnormality. Adrenals/Urinary Tract: Adrenal glands are normal. Cyst in the left kidney. No hydronephrosis. Catheter is present within the urinary bladder. The bladder is empty and thick-walled in appearance with small amount of air. Delayed images of the bladder demonstrate no extravasation to the extra or intraperitoneal soft tissues. Stomach/Bowel: Moderate fluid in the stomach. No dilated small bowel. No bowel wall thickening. Negative appendix. Vascular/Lymphatic: Moderate to marked aortic atherosclerosis without aneurysm. Slightly enlarged left greater than right pelvic sidewall nodes. These measure up to 9 mm on the left and 7 mm on the right and or slightly increased compared to prior. Reproductive: Prostate is unremarkable. Other: Negative for free air or free fluid Musculoskeletal: Bony resorptive changes involving the sacrum, posterior acetabula, bilateral ischial bones. Sclerosis within the left posterior iliac bone, acetabula, and ischial bones consistent with chronic osteomyelitis, these findings do not appear significantly changed. Severe sacral decubitus ulcers. Progressed soft tissue loss at the base of the right penis with possible exposure of a portion of the Foley catheter. IMPRESSION: 1. Advanced sacral decubitus ulcers with progressed soft tissue loss at the base of the right penis. Possible exposure of a portion of the urinary catheter here, recommend correlation with direct inspection. 2. Negative for intra or extraperitoneal bladder rupture. The bladder is severely thick-walled 3. Bony resorptive changes and sclerosis involving the sacrum, posterior acetabula and ischial bones consistent with chronic osteomyelitis, overall unchanged as compared with the prior exam 4. Slight increased pelvic adenopathy Electronically Signed   By: Jasmine PangKim  Fujinaga M.D.   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 Management plans discussed with the patient, family and they are in agreement.  CODE STATUS: Full code  DVT Prophylaxis: SCDs  TOTAL TIME TAKING CARE OF THIS PATIENT: 36 minutes.   POSSIBLE D/C IN  2 to 3 DAYS, DEPENDING ON CLINICAL CONDITION.  Ihor AustinPavan Tanessa Tidd M.D on 11/27/2018 at 9:55 AM  Between 7am to 6pm - Pager - [phone]  After 6pm go to [email or web address] - password [email or web address]  [facility]  Office  [phone]  CC: Primary care physician; [place], [facility]  Note: This dictation was prepared with Dragon dictation along with smaller phrase technology. Any transcriptional errors that result from this process are unintentional.

## 2018-11-27 NOTE — Progress Notes (Signed)
ID Doing okay No new complaints   Patient Vitals for the past 24 hrs:  BP Temp Temp src Pulse Resp SpO2  11/27/18 0516 96/66 97.8 F (36.6 C) Oral 74 - 99 %  11/26/18 2036 108/63 98.4 F (36.9 C) Oral 84 18 99 %    CBC Latest Ref Rng & Units 11/26/2018 11/25/2018 04/23/2018  WBC 4.0 - 10.5 K/uL 9.5 7.1 10.7(H)  Hemoglobin 13.0 - 17.0 g/dL 7.6(L) 8.4(L) 8.3(L)  Hematocrit 39.0 - 52.0 % 28.9(L) 32.4(L) 31.6(L)  Platelets 150 - 400 K/uL 520(H) 587(H) 539(H)    CMP Latest Ref Rng & Units 11/26/2018 11/25/2018 04/23/2018  Glucose 70 - 99 mg/dL 109(H) 112(H) 99  BUN 6 - 20 mg/dL 11 8 6   Creatinine 0.61 - 1.24 mg/dL 0.47(L) 0.46(L) 0.48(L)  Sodium 135 - 145 mmol/L 138 139 139  Potassium 3.5 - 5.1 mmol/L 3.7 3.6 3.9  Chloride 98 - 111 mmol/L 106 105 114(H)  CO2 22 - 32 mmol/L 25 25 15(L)  Calcium 8.9 - 10.3 mg/dL 8.4(L) 8.8(L) 8.5(L)  Total Protein 6.5 - 8.1 g/dL - 8.2(H) -  Total Bilirubin 0.3 - 1.2 mg/dL - 0.4 -  Alkaline Phos 38 - 126 U/L - 78 -  AST 15 - 41 U/L - 11(L) -  ALT 0 - 44 U/L - 8 -    Impression/Recommendation ?43 y.o. male with a history of paraplegia, chronic pressure ulcers with osteomyelitis in the past and currently has been doing well l, neurogenic bladder, presented to the ED yesterday as he was worried that he had punctured his bladder when he was self catheterizing and found that urine was leaking from the sacral wound.  3 sacral pressure wounds with chronic osteomyelitis of the underlying bone.    The ulcer on the right buttock is probably close to the base of the penis on the right side.  All 3 look very clean with pink tissue and no evidence of infection or discharge.  Did not probe the wound up to the scrotal base.  But on CT pelvis it is shown that soft tissue loss at the base of the right penis with possible exposure of a portion of the Foley catheter.  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 Discussed with urologist who thinks the Foley catheter will heal  the fistula.[name]  He will be referred to [facility] in the future for possible surgery if needed. wound and blood NG so far He  will not need long-term IV antibiotics.?   UTI secondary to trauma  urine culture e.coli.    DC vanco and cefepime and start ceftriaxone. Oral medication once susceptibility is available  Anemia of chronic disease: Has low iron and low ferritin. ? _Discussed the management with the patient  ID will follow peripherally this weekend. Call if needed

## 2018-11-27 NOTE — Consult Note (Signed)
Pharmacy Antibiotic Note  Jonathon York is a 42 y.o. male admitted on 11/26/2018 with cellulitis.  Pharmacy has been consulted for cefepime and vancomycin dosing. ID to follow per note.  Pt received pip/tazo and vancomycin in the ED.   Plan: Cefepime 2 g IV q12H per ID MD   Height: 5\' 10"  (177.8 cm) Weight: 115 lb (52.2 kg) IBW/kg (Calculated) : 73  Temp (24hrs), Avg:98.1 F (36.7 C), Min:97.8 F (36.6 C), Max:98.4 F (36.9 C)  Recent Labs  Lab 11/25/18 2153 11/26/18 0447  WBC 7.1 9.5  CREATININE 0.46* 0.47*  LATICACIDVEN 1.0  --     Estimated Creatinine Clearance: 87.9 mL/min (A) (by C-G formula based on SCr of 0.47 mg/dL (L)).    Allergies  Allergen Reactions  . Orange Fruit [Citrus] Other (See Comments)    blisters    Antimicrobials this admission: 7/30 pip/tazo x 1 7/30 cefepime >>  7/30 vancomycin >> 7/31  Dose adjustments this admission: None  Microbiology results: 7/30 BCx: NGTD 7/30 UCx: GNR  7/30 buttocks wound cx: pending 7/30 MRSA PCR: negative  Thank you for allowing pharmacy to be a part of this patient's care.  Rocky Morel, PharmD, BCPS 11/27/2018 11:53 AM

## 2018-11-27 NOTE — NC FL2 (Signed)
[place] MEDICAID FL2 LEVEL OF CARE SCREENING TOOL     IDENTIFICATION  Patient Name: Jonathon York Birthdate: 04-15-1976 Sex: male Admission Date (Current Location): 11/26/2018  [place]   Facility and Address:  [facility], [address], [place], [place] [address]      Provider Number: [id number]  Attending Physician Name and Address:  Ihor AustinPyreddy, Pavan, MD  Relative Name and Phone Number:       Current Level of Care: Hospital Recommended Level of Care: Skilled Nursing Facility Prior Approval Number:    Date Approved/Denied:   PASRR Number: Manual review  Discharge Plan: SNF    Current Diagnoses: Patient Active Problem List   Diagnosis Date Noted  . Intractable pain 11/26/2018  . Anxiety 04/22/2018  . Acute osteomyelitis of right foot (HCC) 03/18/2018  . Pressure injury of skin 03/18/2018  . 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 Protein-calorie malnutrition, severe 11/20/2016  . Herpes simplex infection of penis   . Decubitus ulcer 11/19/2016  . 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 Decubitus ulcer of sacral region   . Sepsis (HCC) 04/15/2016  . 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 Pressure ulcer stage III 04/30/2015  . Sterile pyuria 04/30/2015  . Paraplegia (HCC) 04/30/2015  . Neurogenic bladder 04/30/2015  . 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 Malnutrition of moderate degree 04/28/2015    Orientation RESPIRATION BLADDER Height & Weight     Self, Time, Situation, Place  Normal Indwelling catheter Weight: 115 lb (52.2 kg) Height:  5\' 10"  (177.8 cm)  BEHAVIORAL SYMPTOMS/MOOD NEUROLOGICAL BOWEL NUTRITION STATUS  (None) (None) Continent Diet(Regular)  AMBULATORY STATUS COMMUNICATION OF NEEDS Skin   Total Care(Paraplegic) Verbally PU Stage and Appropriate Care     PU Stage 3 Dressing: (Mid coccyx, Left  coccyx, and right buttocks: ABD, paper tape prn.)                 Personal Care Assistance Level of Assistance  Bathing, Feeding, Dressing Bathing Assistance: Limited assistance Feeding assistance: Independent Dressing Assistance: Limited assistance     Functional Limitations Info  Sight, Hearing, Speech Sight Info: Adequate Hearing Info: Adequate Speech Info: Adequate    SPECIAL CARE FACTORS FREQUENCY                       Contractures Contractures Info: Not present    Additional Factors Info  Code Status, Allergies Code Status Info: Full code Allergies Info: Orange Fruit (Citrus)           Current Medications (11/27/2018):  This is the current hospital active medication list Current Facility-Administered Medications  Medication Dose Route Frequency Provider Last Rate Last Dose  . acetaminophen (TYLENOL) tablet 650 mg  650 mg Oral Q6H PRN Alford HighlandWieting, Albeiro, MD       Or  . acetaminophen (TYLENOL) suppository 650 mg  650 mg Rectal Q6H PRN Wieting, Arville, MD      . ceFEPIme (MAXIPIME) 2 g in sodium chloride 0.9 % 100 mL IVPB  2 g Intravenous Q12H Ravishankar, Jayashree, MD      . feeding supplement (ENSURE ENLIVE) (ENSURE ENLIVE) liquid 237 mL  237 mL Oral TID BM Pyreddy, Pavan, MD   237 mL at 11/27/18 0935  . ferumoxytol (FERAHEME) 510 mg in sodium chloride 0.9 % 100 mL IVPB  510 mg Intravenous Once Marty HeckWang, Hannah L, RPH      .  hydrOXYzine (ATARAX/VISTARIL) tablet 25 mg  25 mg Oral TID PRN Loletha Grayer, MD   25 mg at 11/26/18 1735  . morphine 2 MG/ML injection 2 mg  2 mg Intravenous Q4H PRN Loletha Grayer, MD   2 mg at 11/26/18 1735  . multivitamin with minerals tablet 1 tablet  1 tablet Oral Daily Loletha Grayer, MD   1 tablet at 11/27/18 0935  . multivitamin-lutein (OCUVITE-LUTEIN) capsule 1 capsule  1 capsule Oral Daily Pyreddy, Pavan, MD      . ondansetron (ZOFRAN) tablet 4 mg  4 mg Oral Q6H PRN Wieting, Boomer, MD       Or  . ondansetron (ZOFRAN)  injection 4 mg  4 mg Intravenous Q6H PRN Wieting, Rondo, MD      . oxyCODONE (Oxy IR/ROXICODONE) immediate release tablet 5 mg  5 mg Oral Q4H PRN Loletha Grayer, MD   5 mg at 11/27/18 0945  . vitamin C (ASCORBIC ACID) tablet 250 mg  250 mg Oral BID Saundra Shelling, MD   250 mg at 11/27/18 8295     Discharge Medications: Please see discharge summary for a list of discharge medications.  Relevant Imaging Results:  Relevant Lab Results:   Additional Information SS#: [id number]  Candie Chroman, LCSW

## 2018-11-28 LAB — URINE CULTURE: Culture: >=100000 — AB

## 2018-11-28 MED ORDER — CEPHALEXIN 250 MG PO CAPS
250.0000 mg | ORAL_CAPSULE | Freq: Four times a day (QID) | ORAL | Status: DC
  Administered 2018-11-28 – 2018-11-30 (×8): 250 mg via ORAL
  Filled 2018-11-28 (×9): qty 1

## 2018-11-28 NOTE — Progress Notes (Signed)
[facility] [place] [facility]   PATIENT NAME: Jonathon York    MR#:  [id number]  DATE OF BIRTH:  Jul 05, 1975  SUBJECTIVE:  CHIEF COMPLAINT:   Chief Complaint  Patient presents with  . Wound Check  Patient seen and evaluated today Has buttock wounds Has paraplegia No complaints of chest pain, shortness of breath No fever  REVIEW OF SYSTEMS:    ROS  CONSTITUTIONAL: No documented fever. Has fatigue, weakness. No weight gain, no weight loss.  EYES: No blurry or double vision.  ENT: No tinnitus. No postnasal drip. No redness of the oropharynx.  RESPIRATORY: No cough, no wheeze, no hemoptysis. No dyspnea.  CARDIOVASCULAR: No chest pain. No orthopnea. No palpitations. No syncope.  GASTROINTESTINAL: No nausea, no vomiting or diarrhea. No abdominal pain. No melena or hematochezia.  GENITOURINARY: No dysuria or hematuria.  ENDOCRINE: No polyuria or nocturia. No heat or cold intolerance.  HEMATOLOGY: No anemia. No bruising. No bleeding.  INTEGUMENTARY: Has buttock wounds.  MUSCULOSKELETAL: No arthritis. No swelling. No gout.  NEUROLOGIC: No numbness, tingling, or ataxia. No seizure-type activity.  PSYCHIATRIC: No anxiety. No insomnia. No ADD.   DRUG ALLERGIES:   Allergies  Allergen Reactions  . Orange Fruit [Citrus] Other (See Comments)    blisters    VITALS:  Blood pressure 102/70, pulse 87, temperature 98.5 F (36.9 C), temperature source Oral, resp. rate 20, height 5\' 10"  (1.778 m), weight 52.2 kg, SpO2 98 %.  PHYSICAL EXAMINATION:   Physical Exam  GENERAL:  43 y.o.-year-old patient lying in the bed with no acute distress.  EYES: Pupils equal, round, reactive to light and accommodation. No scleral icterus. Extraocular muscles intact.  HEENT: Head atraumatic, normocephalic. Oropharynx and nasopharynx clear.  NECK:  Supple, no jugular venous distention. No thyroid enlargement, no tenderness.  LUNGS: Normal breath sounds bilaterally, no wheezing,  rales, rhonchi. No use of accessory muscles of respiration.  CARDIOVASCULAR: S1, S2 normal. No murmurs, rubs, or gallops.  ABDOMEN: Soft, nontender, nondistended. Bowel sounds present. No organomegaly or mass.  EXTREMITIES: No cyanosis, clubbing or edema b/l.    NEUROLOGIC: Cranial nerves II through XII are intact. Motor strength 5/ 5 upper extremities Paraplegia PSYCHIATRIC: The patient is alert and oriented x 3.  SKIN: Decubitus ulcers noted    LABORATORY PANEL:   CBC Recent Labs  Lab 11/26/18 0447  WBC 9.5  HGB 7.6*  HCT 28.9*  PLT 520*   ------------------------------------------------------------------------------------------------------------------ Chemistries  Recent Labs  Lab 11/25/18 2153 11/26/18 0447  NA 139 138  K 3.6 3.7  CL 105 106  CO2 25 25  GLUCOSE 112* 109*  BUN 8 11  CREATININE 0.46* 0.47*  CALCIUM 8.8* 8.4*  AST 11*  --   ALT 8  --   ALKPHOS 78  --   BILITOT 0.4  --    ------------------------------------------------------------------------------------------------------------------  Cardiac Enzymes No results for input(s): TROPONINI in the last 168 hours. ------------------------------------------------------------------------------------------------------------------  RADIOLOGY:  No results found.   ASSESSMENT AND PLAN:   43 year old male patient with history of chronic osteomyelitis, sacral decubitus ulcer currently under hospitalist service for wound treatment  - large sacral decubitus ulcers No debridement necessary as of now Continue IV cefepime antibiotic  infectious disease consult appreciated Continue abx as per Urine cx report. 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  All the records are reviewed and case discussed with Care Management/Social Worker. Management plans discussed with the patient, family and they are in agreement.  CODE STATUS: Full code  DVT Prophylaxis: SCDs  TOTAL TIME TAKING CARE OF THIS PATIENT: 36 minutes.   POSSIBLE D/C IN  2 to 3 DAYS, DEPENDING ON CLINICAL CONDITION.  Vaughan Basta M.D on 11/28/2018 at 2:37 PM  Between 7am to 6pm - Pager - [phone]  After 6pm go to [email or web address] - password [email or web address] [facility]  Office  [phone]  CC: Primary care physician; [facility]  Note: This dictation was prepared with Dragon dictation along with smaller phrase technology. Any transcriptional errors that result from this process are unintentional.

## 2018-11-28 NOTE — TOC Progression Note (Signed)
Transition of Care [facility]) - Progression Note    Patient Details  Name: Jonathon York MRN: [id number] Date of Birth: 10-22-1975  Transition of Care [facility]) CM/SW [place], LCSW Phone Number: 11/28/2018, 10:46 AM  Clinical Narrative:  Met with patient and provided bed offers with CMS scores: [facility], [facility], and [facility]. Out of the three patient prefers [facility]. He asked if CSW would send referral out to facilities in the [place] area since it is closer to his home. He requested CSW to look into [facility] (No admissions this weekend but faxed referral), [facility] (Left voicemail for admissions coordinator), [facility] (No admissions this weekend but faxed referral), [facility] (Left voicemail for admissions coordinator), and [facility] (Only take their ALF/ILF residents). [facility] admissions coordinator is aware that he may accept their bed offer. 30 day note still needs to be signed for [facility]. MD is aware.   Expected Discharge Plan: [place] Barriers to Discharge: Continued Medical Work up  Expected Discharge Plan and Services Expected Discharge Plan: [place]: [place] arrangements for the past 2 months: Single Family Home                                       Social Determinants of Health (SDOH) Interventions    Readmission Risk Interventions No flowsheet data found.

## 2018-11-29 NOTE — Progress Notes (Signed)
[employer] - [place] [facility]   PATIENT NAME: Jonathon LyRichard Clingerman    MR#:  [id number]  DATE OF BIRTH:  02-04-1976  SUBJECTIVE:  CHIEF COMPLAINT:   Chief Complaint  Patient presents with  . Wound Check  Patient seen and evaluated today Has buttock wounds Has paraplegia No complaints of chest pain, shortness of breath No fever  REVIEW OF SYSTEMS:    ROS  CONSTITUTIONAL: No documented fever. Has fatigue, weakness. No weight gain, no weight loss.  EYES: No blurry or double vision.  ENT: No tinnitus. No postnasal drip. No redness of the oropharynx.  RESPIRATORY: No cough, no wheeze, no hemoptysis. No dyspnea.  CARDIOVASCULAR: No chest pain. No orthopnea. No palpitations. No syncope.  GASTROINTESTINAL: No nausea, no vomiting or diarrhea. No abdominal pain. No melena or hematochezia.  GENITOURINARY: No dysuria or hematuria.  ENDOCRINE: No polyuria or nocturia. No heat or cold intolerance.  HEMATOLOGY: No anemia. No bruising. No bleeding.  INTEGUMENTARY: Has buttock wounds.  MUSCULOSKELETAL: No arthritis. No swelling. No gout.  NEUROLOGIC: No numbness, tingling, or ataxia. No seizure-type activity.  PSYCHIATRIC: No anxiety. No insomnia. No ADD.   DRUG ALLERGIES:   Allergies  Allergen Reactions  . Orange Fruit [Citrus] Other (See Comments)    blisters    VITALS:  Blood pressure 101/67, pulse 67, temperature 98 F (36.7 C), temperature source Oral, resp. rate 20, height 5\' 10"  (1.778 m), weight 52.2 kg, SpO2 99 %.  PHYSICAL EXAMINATION:   Physical Exam  GENERAL:  43 y.o.-year-old patient lying in the bed with no acute distress.  EYES: Pupils equal, round, reactive to light and accommodation. No scleral icterus. Extraocular muscles intact.  HEENT: Head atraumatic, normocephalic. Oropharynx and nasopharynx clear.  NECK:  Supple, no jugular venous distention. No thyroid enlargement, no tenderness.  LUNGS: Normal breath sounds bilaterally, no wheezing,  rales, rhonchi. No use of accessory muscles of respiration.  CARDIOVASCULAR: S1, S2 normal. No murmurs, rubs, or gallops.  ABDOMEN: Soft, nontender, nondistended. Bowel sounds present. No organomegaly or mass.  EXTREMITIES: No cyanosis, clubbing or edema b/l.    NEUROLOGIC: Cranial nerves II through XII are intact. Motor strength 5/ 5 upper extremities Paraplegia PSYCHIATRIC: The patient is alert and oriented x 3.  SKIN: Decubitus ulcers noted    LABORATORY PANEL:   CBC Recent Labs  Lab 11/26/18 0447  WBC 9.5  HGB 7.6*  HCT 28.9*  PLT 520*   ------------------------------------------------------------------------------------------------------------------ Chemistries  Recent Labs  Lab 11/25/18 2153 11/26/18 0447  NA 139 138  K 3.6 3.7  CL 105 106  CO2 25 25  GLUCOSE 112* 109*  BUN 8 11  CREATININE 0.46* 0.47*  CALCIUM 8.8* 8.4*  AST 11*  --   ALT 8  --   ALKPHOS 78  --   BILITOT 0.4  --    ------------------------------------------------------------------------------------------------------------------  Cardiac Enzymes No results for input(s): TROPONINI in the last 168 hours. ------------------------------------------------------------------------------------------------------------------  RADIOLOGY:  No results found.   ASSESSMENT AND PLAN:   43 year old male patient with history of chronic osteomyelitis, sacral decubitus ulcer currently under hospitalist service for wound treatment  - large sacral decubitus ulcers No debridement necessary as of now Continue IV cefepime antibiotic  infectious disease consult appreciated Continue abx as per Urine cx report. Wound care team consulted  -Gram negative UTI Continue IV cefepime abx F/u cultures- changed to keflex oral.  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  All the records are reviewed and case discussed with Care Management/Social Worker. Management plans discussed with the patient, family and they are in agreement.  CODE STATUS: Full code  DVT Prophylaxis: SCDs  TOTAL TIME TAKING CARE OF THIS PATIENT: 36 minutes.   POSSIBLE D/C IN  2 to 3 DAYS, DEPENDING ON CLINICAL CONDITION.  Vaughan Basta M.D on 11/29/2018 at 3:21 PM  Between 7am to 6pm - Pager - [phone]  After 6pm go to [email or web address] - password [email or web address] [facility]  Office  [phone]  CC: Primary care physician; [facility]  Note: This dictation was prepared with Dragon dictation along with smaller phrase technology. Any transcriptional errors that result from this process are unintentional.

## 2018-11-30 LAB — ABO/RH: ABO/RH(D): O POS

## 2018-11-30 LAB — SARS CORONAVIRUS 2 BY RT PCR (HOSPITAL ORDER, PERFORMED IN ~~LOC~~ HOSPITAL LAB): SARS Coronavirus 2: NEGATIVE

## 2018-11-30 MED ORDER — CEPHALEXIN 250 MG PO CAPS: 250.0000 mg | ORAL_CAPSULE | Freq: Four times a day (QID) | ORAL | 0 refills | Status: DC

## 2018-11-30 MED ORDER — FERROUS SULFATE 325 (65 FE) MG PO TABS
325.0000 mg | ORAL_TABLET | Freq: Two times a day (BID) | ORAL | Status: DC
  Administered 2018-11-30: 325 mg via ORAL
  Filled 2018-11-30: qty 1

## 2018-11-30 MED ORDER — HYDROXYZINE HCL 25 MG PO TABS: 25.0000 mg | ORAL_TABLET | Freq: Three times a day (TID) | ORAL | 0 refills | Status: DC | PRN

## 2018-11-30 MED ORDER — ASCORBIC ACID 250 MG PO TABS: 250.0000 mg | ORAL_TABLET | Freq: Two times a day (BID) | ORAL | 0 refills | Status: DC

## 2018-11-30 MED ORDER — CEPHALEXIN 250 MG PO CAPS: 250.0000 mg | ORAL_CAPSULE | Freq: Four times a day (QID) | ORAL | 0 refills | Status: AC

## 2018-11-30 MED ORDER — SODIUM CHLORIDE 0.9 % IV SOLN
200.0000 mg | Freq: Once | INTRAVENOUS | Status: AC
  Administered 2018-11-30: 200 mg via INTRAVENOUS
  Filled 2018-11-30: qty 10

## 2018-11-30 MED ORDER — FERROUS SULFATE 325 (65 FE) MG PO TABS: 325.0000 mg | ORAL_TABLET | Freq: Two times a day (BID) | ORAL | 3 refills | Status: DC

## 2018-11-30 NOTE — TOC Transition Note (Signed)
Transition of Care [facility]) - CM/SW Discharge Note   Patient Details  Name: Jonathon York MRN: [id number] Date of Birth: [birth year]  [facility] CM/SW Contact:  Candie Chroman, LCSW Phone Number: 11/30/2018, 3:10 PM   Clinical Narrative: Patient has orders to discharge to [facility] in [place] today. Faxed over negative COVID results. Patient has notified his daughter so she can come pick up his wheelchair. RN will call report to [phone] prior to calling EMS. No further concerns. CSW signing off.    Final next level of care: Skilled Nursing Facility Barriers to Discharge: Barriers Resolved   Patient Goals and CMS Choice   CMS Medicare.gov Compare Post Acute Care list provided to:: Patient Choice offered to / list presented to : Patient  Discharge Placement              Patient chooses bed at: ([facility]) Patient to be transferred to facility by: EMS   Patient and family notified of of transfer: 11/30/18  Discharge Plan and Services     Post Acute Care Choice: [place]                               Social Determinants of Health (SDOH) Interventions     Readmission Risk Interventions No flowsheet data found.

## 2018-11-30 NOTE — Progress Notes (Signed)
ID Pt doing well Patient Vitals for the past 24 hrs:  BP Temp Temp src Pulse Resp SpO2  11/30/18 0503 102/77 (!) 97.5 F (36.4 C) Oral 77 18 94 %    Sacral pressure ulcers are doing well   CBC Latest Ref Rng & Units 11/26/2018 11/25/2018 04/23/2018  WBC 4.0 - 10.5 K/uL 9.5 7.1 10.7(H)  Hemoglobin 13.0 - 17.0 g/dL 7.6(L) 8.4(L) 8.3(L)  Hematocrit 39.0 - 52.0 % 28.9(L) 32.4(L) 31.6(L)  Platelets 150 - 400 K/uL 520(H) 587(H) 539(H)   CMP Latest Ref Rng & Units 11/26/2018 11/25/2018 04/23/2018  Glucose 70 - 99 mg/dL 109(H) 112(H) 99  BUN 6 - 20 mg/dL 11 8 6   Creatinine 0.61 - 1.24 mg/dL 0.47(L) 0.46(L) 0.48(L)  Sodium 135 - 145 mmol/L 138 139 139  Potassium 3.5 - 5.1 mmol/L 3.7 3.6 3.9  Chloride 98 - 111 mmol/L 106 105 114(H)  CO2 22 - 32 mmol/L 25 25 15(L)  Calcium 8.9 - 10.3 mg/dL 8.4(L) 8.8(L) 8.5(L)  Total Protein 6.5 - 8.1 g/dL - 8.2(H) -  Total Bilirubin 0.3 - 1.2 mg/dL - 0.4 -  Alkaline Phos 38 - 126 U/L - 78 -  AST 15 - 41 U/L - 11(L) -  ALT 0 - 44 U/L - 8 -    Impression/Recommendation  43 y.o.malewith a history ofparaplegia, chronic pressure ulcers with osteomyelitis in the past and currently has been doing well l, neurogenic bladder, presented to the ED yesterday as he was worried that he had punctured his bladder when he was self catheterizing and found that urine was leaking from the sacral wound.  3 sacral pressure wounds with chronic osteomyelitis of the underlying bone. The ulcer on the right buttock is probably close to the base of the penis on the right side. All 3 look very clean with pink tissue and no evidence of infection or discharge. Did not probe the wound up to the scrotal base. But on CT pelvis it is shown that soft tissue loss at the base of the right penis with possible exposure of a portion of the Foley catheter. 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 Wound has e.coli like urine   UTI secondary to trauma  urine culture e.coli.  He is now on keflex which he will need for 14 days    Anemia of chronic disease: Has low iron and low ferritin. Discussed with patient ID will sign off- call if needed

## 2018-11-30 NOTE — Discharge Summary (Addendum)
[facility] - [place] at [facility]   PATIENT NAME: Jonathon York    MR#:  [id number]  DATE OF BIRTH:  March 16, 1976  DATE OF ADMISSION:  11/26/2018 ADMITTING PHYSICIAN: Alford Highlandichard Wieting, MD  DATE OF DISCHARGE: 11/30/2018   PRIMARY CARE PHYSICIAN: [place], [facility]    ADMISSION DIAGNOSIS:  Intractable pain [R52] Sacral decubitus ulcer, stage IV (HCC) [L89.154] Chronic osteomyelitis (HCC) [M86.60] Urinary tract infection without hematuria, site unspecified [N39.0]  DISCHARGE DIAGNOSIS:  Active Problems:   Intractable pain   SECONDARY DIAGNOSIS:   Past Medical History:  Diagnosis Date  . Anxiety   . Decubitus ulcer   . Depression   . Neurogenic bladder   . Osteomyelitis (HCC)   . Paraparesis of both lower limbs (HCC) 02/21/00    HOSPITAL COURSE:   43 year old male patient with history of chronic osteomyelitis, sacral decubitus ulcer currently under hospitalist service for wound treatment  - large sacral decubitus ulcers No debridement necessary as of now Continue IV cefepime antibiotic  infectious disease consult appreciated Continue abx as per Urine cx report. Wound care team consulted  -Gram negative UTI Continue IV cefepime abx F/u cultures- changed to keflex oral.  give 14 more days oral as per ID recommendations.  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  DISCHARGE CONDITIONS:   Stable.  CONSULTS OBTAINED:  Treatment Team:  Sondra ComeSninsky, Brian C, MD  DRUG ALLERGIES:   Allergies  Allergen Reactions  . Orange Fruit [Citrus] Other (See Comments)    Blisters only from ORANGES!!! Patient is not allergic from all citrus    DISCHARGE MEDICATIONS:   Allergies as of 11/30/2018      Reactions   Orange Fruit [citrus] Other (See Comments)   Blisters only from ORANGES!!! Patient is not allergic from all citrus      Medication List    TAKE these medications   Advil 200 MG tablet Generic drug: ibuprofen Take 400 mg by mouth every 6 (six) hours as needed for mild pain.   ascorbic acid 250 MG tablet Commonly known as: VITAMIN C Take 1 tablet (250 mg total) by mouth 2 (two) times daily.   cephALEXin 250 MG capsule Commonly known as: KEFLEX Take 1 capsule (250 mg total) by mouth every 6 (six) hours for 5 days.   ferrous sulfate 325 (65 FE) MG tablet Take 1 tablet (325 mg total) by mouth 2 (two) times daily with a meal.   hydrOXYzine 25 MG tablet Commonly known as: ATARAX/VISTARIL Take 1 tablet (25 mg total) by mouth 3 (three) times daily as needed for itching.   multivitamin with minerals Tabs tablet Take 1 tablet by mouth daily.        DISCHARGE INSTRUCTIONS:   Follow with PMD in 1-2 weeks.  If you experience worsening of your admission symptoms, develop shortness of breath, life threatening emergency, suicidal or homicidal thoughts you must seek  medical attention immediately by calling 911 or calling your MD immediately  if symptoms less severe.  You Must read complete instructions/literature along with all the possible adverse reactions/side effects for all the Medicines you take and that have been prescribed to you. Take any new Medicines after you have completely understood and accept all the possible adverse reactions/side effects.   Please note  You were cared for by a hospitalist during your hospital  stay. If you have any questions about your discharge medications or the care you received while you were in the hospital after you are discharged, you can call the unit and asked to speak with the hospitalist on call if the hospitalist that took care of you is not available. Once you are discharged, your primary care physician will handle any further medical issues. Please note that NO REFILLS for any discharge medications will be authorized once you are discharged, as it is imperative that you return to your primary care physician (or establish a relationship with a primary care physician if you do not have one) for your aftercare needs so that they can reassess your need for medications and monitor your lab values.    Today   CHIEF COMPLAINT:   Chief Complaint  Patient presents with  . Wound Check    HISTORY OF PRESENT ILLNESS:  Jonathon York  is a 43 y.o. male with a known history of paralysis from the waist down in 3 wounds on his buttock.  He has needed in and out catheterizations for the last 3 years.  He ran out of the supplies 2 months ago.  He rigged a long tube to collect his urine.  He thought he perforated his bladder because urine is been leaking out of his sacral wounds.  He has a fever and he was concerned that he may be septic.  He is having worsening pain and required 3 rounds of IV pain medications in the emergency room.  Urine analysis was found to be positive.  Hospitalist services contacted for further evaluation.  CT scan was negative for bladder rupture.  Positive for chronic osteomyelitis    VITAL SIGNS:  Blood pressure 102/77, pulse 77, temperature (!) 97.5 F (36.4 C), temperature source Oral, resp. rate 18, height 5\' 10"  (1.778 m), weight 52.2 kg, SpO2 94 %.  I/O:    Intake/Output Summary (Last 24 hours) at 11/30/2018 1248 Last data filed at 11/30/2018 0928 Gross per 24 hour  Intake 1080 ml  Output 1150 ml  Net -70 ml    PHYSICAL EXAMINATION:   GENERAL:  43  y.o.-year-old patient lying in the bed with no acute distress.  EYES: Pupils equal, round, reactive to light and accommodation. No scleral icterus. Extraocular muscles intact.  HEENT: Head atraumatic, normocephalic. Oropharynx and nasopharynx clear.  NECK:  Supple, no jugular venous distention. No thyroid enlargement, no tenderness.  LUNGS: Normal breath sounds bilaterally, no wheezing, rales, rhonchi. No use of accessory muscles of respiration.  CARDIOVASCULAR: S1, S2 normal. No murmurs, rubs, or gallops.  ABDOMEN: Soft, nontender, nondistended. Bowel sounds present. No organomegaly or mass.  EXTREMITIES: No cyanosis, clubbing or edema b/l.    NEUROLOGIC: Cranial nerves II through XII are intact. Motor strength 5/ 5 upper extremities Paraplegia PSYCHIATRIC: The patient is alert and oriented x 3.  SKIN: Decubitus ulcers noted  DATA REVIEW:   CBC Recent Labs  Lab 11/26/18 0447  WBC 9.5  HGB 7.6*  HCT 28.9*  PLT 520*    Chemistries  Recent  Labs  Lab 11/25/18 2153 11/26/18 0447  NA 139 138  K 3.6 3.7  CL 105 106  CO2 25 25  GLUCOSE 112* 109*  BUN 8 11  CREATININE 0.46* 0.47*  CALCIUM 8.8* 8.4*  AST 11*  --   ALT 8  --   ALKPHOS 78  --   BILITOT 0.4  --     Cardiac Enzymes No results for input(s): TROPONINI in the last 168 hours.  Microbiology Results  Results for orders placed or performed during the hospital encounter of 11/26/18  Aerobic/Anaerobic Culture (surgical/deep wound)     Status: None (Preliminary result)   Collection Time: 11/26/18  1:19 AM   Specimen: Buttocks; Wound  Result Value Ref Range Status   Specimen Description BUTTOCKS  Final   Special Requests Normal  Final   Gram Stain   Final    FEW WBC PRESENT, PREDOMINANTLY PMN NO ORGANISMS SEEN    Culture   Final    RARE ESCHERICHIA COLI NO ANAEROBES ISOLATED; CULTURE IN PROGRESS FOR 5 DAYS    Report Status PENDING  Incomplete   Organism ID, Bacteria ESCHERICHIA COLI  Final       Susceptibility   Escherichia coli - MIC*    AMPICILLIN <=2 SENSITIVE Sensitive     CEFAZOLIN <=4 SENSITIVE Sensitive     CEFEPIME <=1 SENSITIVE Sensitive     CEFTAZIDIME <=1 SENSITIVE Sensitive     CEFTRIAXONE <=1 SENSITIVE Sensitive     CIPROFLOXACIN <=0.25 SENSITIVE Sensitive     GENTAMICIN <=1 SENSITIVE Sensitive     IMIPENEM <=0.25 SENSITIVE Sensitive     TRIMETH/SULFA <=20 SENSITIVE Sensitive     AMPICILLIN/SULBACTAM <=2 SENSITIVE Sensitive     PIP/TAZO <=4 SENSITIVE Sensitive     Extended ESBL Value in next row Sensitive      NEGATIVEPerformed at [facility], [address] [place], [place] [address]    * RARE ESCHERICHIA COLI  SARS Coronavirus 2 (CEPHEID - Performed in [facility] hospital lab), Hosp Order     Status: None   Collection Time: 11/26/18  1:30 AM   Specimen: Buttocks; Nasopharyngeal  Result Value Ref Range Status   SARS Coronavirus 2 NEGATIVE NEGATIVE Final    Comment: (NOTE) If result is NEGATIVE SARS-CoV-2 target nucleic acids are NOT DETECTED. The SARS-CoV-2 RNA is generally detectable in upper and lower  respiratory specimens during the acute phase of infection. The lowest  concentration of SARS-CoV-2 viral copies this assay can detect is 250  copies / mL. A negative result does not preclude SARS-CoV-2 infection  and should not be used as the sole basis for treatment or other  patient management decisions.  A negative result may occur with  improper specimen collection / handling, submission of specimen other  than nasopharyngeal swab, presence of viral mutation(s) within the  areas targeted by this assay, and inadequate number of viral copies  (<250 copies / mL). A negative result must be combined with clinical  observations, patient history, and epidemiological information. If result is POSITIVE SARS-CoV-2 target nucleic acids are DETECTED. The SARS-CoV-2 RNA is generally detectable in upper and lower  respiratory specimens dur ing the acute  phase of infection.  Positive  results are indicative of active infection with SARS-CoV-2.  Clinical  correlation with patient history and other diagnostic information is  necessary to determine patient infection status.  Positive results do  not rule out bacterial infection or co-infection with other viruses. If result is PRESUMPTIVE POSTIVE SARS-CoV-2  nucleic acids MAY BE PRESENT.   A presumptive positive result was obtained on the submitted specimen  and confirmed on repeat testing.  While 2019 novel coronavirus  (SARS-CoV-2) nucleic acids may be present in the submitted sample  additional confirmatory testing may be necessary for epidemiological  and / or clinical management purposes  to differentiate between  SARS-CoV-2 and other Sarbecovirus currently known to infect humans.  If clinically indicated additional testing with an alternate test  methodology [phone]([id number]) is advised. The SARS-CoV-2 RNA is generally  detectable in upper and lower respiratory sp ecimens during the acute  phase of infection. The expected result is Negative. Fact Sheet for Patients:  BoilerBrush.com.cyhttps://www.fda.gov/media/136312/download Fact Sheet for Healthcare Providers: https://pope.com/https://www.fda.gov/media/136313/download This test is not yet approved or cleared by the [place] FDA and has been authorized for detection and/or diagnosis of SARS-CoV-2 by FDA under an Emergency Use Authorization (EUA).  This EUA will remain in effect (meaning this test can be used) for the duration of the COVID-19 declaration under Section 564(b)(1) of the Act, 21 U.S.C. section 360bbb-3(b)(1), unless the authorization is terminated or revoked sooner. Performed at [facility], [address] [place], [place] [address]   Culture, blood (routine x 2)     Status: None (Preliminary result)   Collection Time: 11/26/18  1:31 AM   Specimen: BLOOD  Result Value Ref Range Status   Specimen Description BLOOD LEFT ANTECUBITAL  Final    Special Requests   Final    BOTTLES DRAWN AEROBIC AND ANAEROBIC Blood Culture results may not be optimal due to an excessive volume of blood received in culture bottles   Culture   Final    NO GROWTH 4 DAYS Performed at [facility], [address] [place], [place] [address]    Report Status PENDING  Incomplete  Culture, blood (routine x 2)     Status: None (Preliminary result)   Collection Time: 11/26/18  2:13 AM   Specimen: BLOOD  Result Value Ref Range Status   Specimen Description BLOOD RIGHT ANTECUBITAL  Final   Special Requests   Final    BOTTLES DRAWN AEROBIC AND ANAEROBIC Blood Culture results may not be optimal due to an excessive volume of blood received in culture bottles   Culture   Final    NO GROWTH 4 DAYS Performed at [facility], [address] [place], [place] [address]    Report Status PENDING  Incomplete  Urine culture     Status: Abnormal   Collection Time: 11/26/18  2:13 AM   Specimen: Urine, Random  Result Value Ref Range Status   Specimen Description   Final    URINE, RANDOM Performed at [facility], [address] [place], [place] [address]    Special Requests   Final    NONE Performed at [facility], [address] [place], [place] [address]    Culture >=100,000 COLONIES/mL ESCHERICHIA COLI (A)  Final   Report Status 11/28/2018 FINAL  Final   Organism ID, Bacteria ESCHERICHIA COLI (A)  Final      Susceptibility   Escherichia coli - MIC*    AMPICILLIN <=2 SENSITIVE Sensitive     CEFAZOLIN <=4 SENSITIVE Sensitive     CEFTRIAXONE <=1 SENSITIVE Sensitive     CIPROFLOXACIN <=0.25 SENSITIVE Sensitive     GENTAMICIN <=1 SENSITIVE Sensitive     IMIPENEM <=0.25 SENSITIVE Sensitive     NITROFURANTOIN <=16 SENSITIVE Sensitive     TRIMETH/SULFA <=20 SENSITIVE Sensitive  AMPICILLIN/SULBACTAM <=2 SENSITIVE Sensitive     PIP/TAZO <=4 SENSITIVE Sensitive     Extended ESBL NEGATIVE Sensitive     * >=100,000  COLONIES/mL ESCHERICHIA COLI  MRSA PCR Screening     Status: None   Collection Time: 11/26/18  5:25 AM   Specimen: Nasal Mucosa; Nasopharyngeal  Result Value Ref Range Status   MRSA by PCR NEGATIVE NEGATIVE Final    Comment:        The GeneXpert MRSA Assay (FDA approved for NASAL specimens only), is one component of a comprehensive MRSA colonization surveillance program. It is not intended to diagnose MRSA infection nor to guide or monitor treatment for MRSA infections. Performed at [facility], [address], [place], [place] [address]     RADIOLOGY:  No results found.  EKG:   Orders placed or performed during the hospital encounter of 02/14/17  . ED EKG 12-Lead  . ED EKG 12-Lead  . EKG      Management plans discussed with the patient, family and they are in agreement.  CODE STATUS:     Code Status Orders  (From admission, onward)         Start     Ordered   11/26/18 0411  Full code  Continuous     11/26/18 0411        Code Status History    Date Active Date Inactive Code Status Order ID Comments User Context   04/22/2018 2308 04/23/2018 1622 Full Code [id number]  Lance Coon, MD Inpatient   03/18/2018 1119 03/23/2018 1659 Full Code [id number]  Henreitta Leber, MD ED   02/14/2017 2321 02/19/2017 2046 Full Code [id number]  Lance Coon, MD Inpatient   11/19/2016 1720 11/21/2016 2207 Full Code [id number]  Dustin Flock, MD Inpatient   04/15/2016 1802 04/19/2016 0211 Full Code [id number]  Demetrios Loll, MD Inpatient   04/29/2015 1017 04/30/2015 1339 Full Code [id number]  Sharlotte Alamo, MD Inpatient   04/28/2015 0438 04/29/2015 1017 Full Code [id number]  Hillary Bow, MD ED   [facility] Care Planning Activity      TOTAL TIME TAKING CARE OF THIS PATIENT: 35 minutes.    Vaughan Basta M.D on 11/30/2018 at 12:48 PM  Between 7am to 6pm - Pager - [phone]  After 6pm go to [email or web address] - password [email or web address] [place] [facility]  Office   [phone]  CC: Primary care physician; [facility]   Note: This dictation was prepared with Dragon dictation along with smaller phrase technology. Any transcriptional errors that result from this process are unintentional.

## 2018-11-30 NOTE — Care Management Important Message (Signed)
Important Message  Patient Details  Name: Jonathon York MRN: [id number] Date of Birth: 1975-09-28   Medicare Important Message Given:  Yes     Dannette Barbara 11/30/2018, 11:42 AM

## 2018-11-30 NOTE — Plan of Care (Signed)
Patient will be discharging home with [facility]. Patient originally had accepted a bed at [facility] in [place] but was uncomfortable with the idea of patients having contracted COVID while there. He is terrified that with his compromised immune system, he would be taking a big risk by going to the facility. Patients daughter agreed to do dressing changes in the event that [facility] is not able to do so. Using the teachback method, she was educated on how to do dressing changes. What supplies to use, and the frequency in which the bandages should be changed.   Pt IV removed. Foley in place. Prescriptions given. Belongings gathered. Patient will be discharged to home via POV.

## 2018-11-30 NOTE — Plan of Care (Signed)
Discharged to home with Foley in place

## 2018-11-30 NOTE — TOC Transition Note (Signed)
Transition of Care [facility]) - CM/SW Discharge Note   Patient Details  Name: JUSTN QUALE MRN: [id number] Date of Birth: [birth year]  [facility] CM/SW Contact:  Candie Chroman, LCSW Phone Number: 11/30/2018, 4:24 PM   Clinical Narrative:   Patient now prefers to return home at discharge. He has worked with [facility] in the past and would like to continue with them. Referral made to [facility]. Corene Cornea will figure out who supplies patient's Aquacel. No further concerns. CSW signing off.   Final next level of care: Home w Home Health Services Barriers to Discharge: Barriers Resolved   Patient Goals and CMS Choice   CMS Medicare.gov Compare Post Acute Care list provided to:: Patient Choice offered to / list presented to : Patient, Adult Children  Discharge Placement              Patient chooses bed at: ([facility]) Patient to be transferred to facility by: Daughter will drive Name of family member notified: Silvino Selman Patient and family notified of of transfer: 11/30/18  Discharge Plan and Services     Post Acute Care Choice: [place]          DME Arranged: N/A         HH Arranged: RN [place] Agency: [place] ([place]) Date [place]: 11/30/18   Representative spoke with at [place]: [place] ([place]) Interventions     Readmission Risk Interventions No flowsheet data found.

## 2018-11-30 NOTE — TOC Progression Note (Addendum)
Transition of Care [facility]) - Progression Note    Patient Details  Name: Jonathon York MRN: [id number] Date of Birth: 09-19-1975  Transition of Care [facility]) CM/SW [place], LCSW Phone Number: 11/30/2018, 9:45 AM  Clinical Narrative: Faxed requested documents to [facility] for PASARR review. [facility] did not receive referral that was sent on Saturday so CSW faxed again. [facility] is not currently accepting outside referrals. Faxed referral to [employer] again and also faxed referral to [facility].     9:59 am: Patient has a bed at [facility] in [place]. He is aware and will ask his daughter to pick up his wheelchair since EMS will not be able to take it with him. Patient will need a new COVID test prior to discharge. Sent MD a message to notify.  1:18 pm: Faxed discharge summary to [employer]. COVID test still pending.  Expected Discharge Plan: [place] Barriers to Discharge: Continued Medical Work up  Expected Discharge Plan and Services Expected Discharge Plan: [place] Choice: [place] arrangements for the past 2 months: Single Family Home                                       Social Determinants of Health (SDOH) Interventions    Readmission Risk Interventions No flowsheet data found.

## 2018-12-01 LAB — CULTURE, BLOOD (ROUTINE X 2)
Culture: NO GROWTH
Culture: NO GROWTH

## 2018-12-01 LAB — AEROBIC/ANAEROBIC CULTURE (SURGICAL/DEEP WOUND): Special Requests: NORMAL

## 2018-12-01 LAB — AEROBIC/ANAEROBIC CULTURE W GRAM STAIN (SURGICAL/DEEP WOUND)

## 2018-12-23 ENCOUNTER — Other Ambulatory Visit: Payer: Self-pay | Admitting: Internal Medicine

## 2018-12-23 DIAGNOSIS — Z20822 Contact with and (suspected) exposure to covid-19: Secondary | ICD-10-CM

## 2018-12-24 LAB — SPECIMEN STATUS REPORT

## 2018-12-24 LAB — NOVEL CORONAVIRUS, NAA: SARS-CoV-2, NAA: NOT DETECTED

## 2019-05-10 ENCOUNTER — Other Ambulatory Visit: Payer: Self-pay

## 2019-05-10 ENCOUNTER — Encounter: Payer: Self-pay | Admitting: Emergency Medicine

## 2019-05-10 ENCOUNTER — Emergency Department
Admission: EM | Admit: 2019-05-10 | Discharge: 2019-05-10 | Disposition: A | Discharge status: Home / Self Care | Payer: Medicare Other | Attending: Emergency Medicine | Admitting: Emergency Medicine

## 2019-05-10 ENCOUNTER — Emergency Department: Payer: Medicare Other

## 2019-05-10 DIAGNOSIS — Z79899 Other long term (current) drug therapy: Secondary | ICD-10-CM | POA: Diagnosis not present

## 2019-05-10 DIAGNOSIS — R319 Hematuria, unspecified: Secondary | ICD-10-CM | POA: Diagnosis not present

## 2019-05-10 DIAGNOSIS — G8220 Paraplegia, unspecified: Secondary | ICD-10-CM | POA: Diagnosis not present

## 2019-05-10 DIAGNOSIS — L89154 Pressure ulcer of sacral region, stage 4: Secondary | ICD-10-CM | POA: Insufficient documentation

## 2019-05-10 DIAGNOSIS — Z87891 Personal history of nicotine dependence: Secondary | ICD-10-CM | POA: Insufficient documentation

## 2019-05-10 DIAGNOSIS — R112 Nausea with vomiting, unspecified: Secondary | ICD-10-CM

## 2019-05-10 LAB — COMPREHENSIVE METABOLIC PANEL
ALT: 12 U/L (ref 0–44)
AST: 18 U/L (ref 15–41)
Albumin: 3.7 g/dL (ref 3.5–5.0)
Alkaline Phosphatase: 79 U/L (ref 38–126)
Anion gap: 15 (ref 5–15)
BUN: 14 mg/dL (ref 6–20)
CO2: 23 mmol/L (ref 22–32)
Calcium: 9 mg/dL (ref 8.9–10.3)
Chloride: 103 mmol/L (ref 98–111)
Creatinine, Ser: 0.59 mg/dL — ABNORMAL LOW (ref 0.61–1.24)
GFR calc Af Amer: >60 mL/min (ref >60)
GFR calc non Af Amer: >60 mL/min (ref >60)
Glucose, Bld: 138 mg/dL — ABNORMAL HIGH (ref 70–99)
Potassium: 3.2 mmol/L — ABNORMAL LOW (ref 3.5–5.1)
Sodium: 141 mmol/L (ref 135–145)
Total Bilirubin: 0.6 mg/dL (ref 0.3–1.2)
Total Protein: 7.9 g/dL (ref 6.5–8.1)

## 2019-05-10 LAB — CBC
HCT: 38.8 % — ABNORMAL LOW (ref 39.0–52.0)
Hemoglobin: 11 g/dL — ABNORMAL LOW (ref 13.0–17.0)
MCH: 19.7 pg — ABNORMAL LOW (ref 26.0–34.0)
MCHC: 28.4 g/dL — ABNORMAL LOW (ref 30.0–36.0)
MCV: 69.5 fL — ABNORMAL LOW (ref 80.0–100.0)
Platelets: 468 10*3/uL — ABNORMAL HIGH (ref 150–400)
RBC: 5.58 MIL/uL (ref 4.22–5.81)
RDW: 20.4 % — ABNORMAL HIGH (ref 11.5–15.5)
WBC: 9.4 10*3/uL (ref 4.0–10.5)
nRBC: 0 % (ref 0.0–0.2)

## 2019-05-10 LAB — LIPASE, BLOOD: Lipase: 20 U/L (ref 11–51)

## 2019-05-10 MED ORDER — MORPHINE SULFATE (PF) 4 MG/ML IV SOLN
4.0000 mg | Freq: Once | INTRAVENOUS | Status: AC
  Administered 2019-05-10: 4 mg via INTRAVENOUS
  Filled 2019-05-10: qty 1

## 2019-05-10 MED ORDER — ONDANSETRON HCL 4 MG/2ML IJ SOLN
4.0000 mg | Freq: Once | INTRAMUSCULAR | Status: AC
  Administered 2019-05-10: 4 mg via INTRAVENOUS
  Filled 2019-05-10: qty 2

## 2019-05-10 MED ORDER — ONDANSETRON 4 MG PO TBDP: 4.0000 mg | ORAL_TABLET | Freq: Three times a day (TID) | ORAL | 0 refills | Status: DC | PRN

## 2019-05-10 MED ORDER — IOHEXOL 300 MG/ML  SOLN
75.0000 mL | Freq: Once | INTRAMUSCULAR | Status: AC | PRN
  Administered 2019-05-10: 75 mL via INTRAVENOUS

## 2019-05-10 MED ORDER — POTASSIUM CHLORIDE CRYS ER 20 MEQ PO TBCR
40.0000 meq | EXTENDED_RELEASE_TABLET | Freq: Once | ORAL | Status: AC
  Administered 2019-05-10: 40 meq via ORAL
  Filled 2019-05-10: qty 2

## 2019-05-10 MED ORDER — SODIUM CHLORIDE 0.9 % IV BOLUS
1000.0000 mL | Freq: Once | INTRAVENOUS | Status: AC
  Administered 2019-05-10: 19:00:00 1000 mL via INTRAVENOUS

## 2019-05-10 MED ORDER — SODIUM CHLORIDE 0.9% FLUSH: 3.0000 mL | Freq: Once | INTRAVENOUS | Status: DC

## 2019-05-10 NOTE — ED Provider Notes (Signed)
[facility] Emergency Department Provider Note  Time seen: 5:17 PM  I have reviewed the triage vital signs and the nursing notes.   HISTORY  Chief Complaint Nausea and Emesis   HPI Jonathon York is a 44 y.o. male with a past medical history anxiety, paraplegia in the lower extremities, chronic decubitus ulcerations, presents to the emergency department with vomiting.  According to the patient he has been nauseated over the past 2 days with frequent episodes of vomiting.  He also states he placed his Foley catheter a week ago noticed some blood after doing so which has since resolved but was concerned that he could have injured his bladder when placing the catheter.  No lower abdominal pain.  Patient denies any fever cough or shortness of breath.  Reassuring vitals.  States he is actually feeling much less nauseous since receiving nausea medication in the emergency department.   Past Medical History:  Diagnosis Date  . Anxiety   . Decubitus ulcer   . Depression   . Neurogenic bladder   . Osteomyelitis (HCC)   . 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 Intractable pain 11/26/2018  . Anxiety 04/22/2018  . Acute osteomyelitis of right foot (HCC) 03/18/2018  . Pressure injury of skin 03/18/2018  . 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 Protein-calorie malnutrition, severe 11/20/2016  . Herpes simplex infection of penis   . Decubitus ulcer 11/19/2016  . 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 Decubitus ulcer of sacral region   . Sepsis (HCC) 04/15/2016  . 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 Pressure ulcer stage III 04/30/2015  . Sterile pyuria 04/30/2015  . Paraplegia (HCC) 04/30/2015  . Neurogenic bladder 04/30/2015  . 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 Malnutrition of moderate degree 04/28/2015     Past Surgical History:  Procedure Laterality Date  . AMPUTATION Right 03/19/2018   Procedure: 5th Metatarsal Resection;  Surgeon: Recardo Evangelist, DPM;  Location: [facility] ORS;  Service: Podiatry;  Laterality: Right;  . 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 IRRIGATION AND DEBRIDEMENT BUTTOCKS    . TOE AMPUTATION Right     Prior to Admission medications   Medication Sig Start Date End Date Taking? Authorizing Provider  ferrous sulfate 325 (65 FE) MG tablet Take 1 tablet (325 mg total) by mouth 2 (two) times daily with a meal. 11/30/18   Altamese Dilling, MD  hydrOXYzine (ATARAX/VISTARIL) 25 MG tablet Take 1 tablet (25 mg total) by mouth 3 (three) times daily as needed for itching. 11/30/18   Altamese Dilling, MD  ibuprofen (ADVIL) 200 MG tablet Take 400 mg by mouth every 6 (six) hours as needed for mild pain.    [provider]  Multiple Vitamin (MULTIVITAMIN WITH MINERALS) TABS tablet Take 1 tablet by mouth daily. 11/22/16   Altamese Dilling, MD  vitamin C (VITAMIN C) 250 MG tablet Take 1 tablet (250 mg total) by mouth 2 (two) times daily. 11/30/18   Altamese Dilling, MD    Allergies  Allergen Reactions  . Orange Fruit [Citrus] Other (See Comments)    Blisters only from ORANGES!!! Patient is not allergic from all citrus    Family History  Problem Relation Age of Onset  . Lymphoma  Sister     Social History Social History   Tobacco Use  . Smoking status: Former Smoker    Packs/day: 0.50    Types: Cigarettes  . Smokeless tobacco: Never Used  Substance Use Topics  . Alcohol use: No  . Drug use: Yes    Types: Marijuana    Comment: 2 days ago    Review of Systems Constitutional: Negative for fever. Cardiovascular: Negative for chest pain. Respiratory: Negative for shortness of breath.  Negative for cough. Gastrointestinal: Negative  for abdominal pain.  Positive for nausea and vomiting over the past several days.  No diarrhea. Genitourinary: Negative for urinary compaints Musculoskeletal: Negative for musculoskeletal complaints Neurological: Negative for headache All other ROS negative  ____________________________________________   PHYSICAL EXAM:  VITAL SIGNS: ED Triage Vitals  Enc Vitals Group     BP 05/10/19 1536 110/66     Pulse Rate 05/10/19 1536 82     Resp 05/10/19 1536 16     Temp 05/10/19 1536 98.1 F (36.7 C)     Temp Source 05/10/19 1536 Oral     SpO2 05/10/19 1536 100 %     Weight 05/10/19 1539 100 lb (45.4 kg)     Height 05/10/19 1539 5\' 9"  (1.753 m)     Head Circumference --      Peak Flow --      Pain Score 05/10/19 1539 7     Pain Loc --      Pain Edu? --      Excl. in GC? --    Constitutional: Alert and oriented. Well appearing and in no distress. Eyes: Normal exam ENT      Head: Normocephalic and atraumatic.      Mouth/Throat: Mucous membranes are moist. Cardiovascular: Normal rate, regular rhythm. No murmur Respiratory: Normal respiratory effort without tachypnea nor retractions. Breath sounds are clear  Gastrointestinal: Soft and nontender. No distention.  Significant sacral ulcerations but appear chronic.  No significant surrounding erythema or discharge to suggest acute infection. Musculoskeletal: Patient able to move himself around using upper extremities, no lower extremity edema. Neurologic:  Normal speech and language. No gross focal neurologic deficits  Skin: Sacral ulcerations as described above. Psychiatric: Mood and affect are normal.   ____________________________________________   INITIAL IMPRESSION / ASSESSMENT AND PLAN / ED COURSE  Pertinent labs & imaging results that were available during my care of the patient were reviewed by me and considered in my medical decision making (see chart for details).   Patient presents to the emergency department with nausea  vomiting also states he placed a catheter 1 week ago noticed some blood upon placing the catheter.  Patient states he had a history of rupturing his bladder previously and was concerned something like back be going on although the patient denies any abdominal pain.  No abdominal tenderness on my exam.  Patient's blood work is largely reassuring very slight hypokalemia with mild dehydration.  We will IV hydrate.  I reviewed the patient's prior CT in July showing erosion of the decubitus ulcer to the base of the penis possible erosion to the urethra.  Patient states he never followed up with anyone.  We will repeat a CT scan of the abdomen as a precaution.  Patient agreeable to plan of care.  CT is largely unchanged from prior CT.  Foley extends into the urinary bladder.  We will have the patient follow-up with urology as well as surgery.  Patient agreeable to plan and will follow up.  [name] was evaluated in Emergency Department on 05/10/2019 for the symptoms described in the history of present illness. He was evaluated in the context of the global COVID-19 pandemic, which necessitated consideration that the patient might be at risk for infection with the SARS-CoV-2 virus that causes COVID-19. Institutional protocols and algorithms that pertain to the evaluation of patients at risk for COVID-19 are in a state of rapid change based on information released by regulatory bodies including the CDC and federal and state organizations. These policies and algorithms were followed during the patient's care in the ED.  ____________________________________________   FINAL CLINICAL IMPRESSION(S) / ED DIAGNOSES  Sacral ulcers Nausea vomiting   Harvest Dark, MD 05/10/19 [phone]

## 2019-05-10 NOTE — ED Notes (Signed)
IV fluids currently infusing; pt will be d/c'd upon completion.

## 2019-05-10 NOTE — ED Triage Notes (Signed)
Pt to ED via [facility] from home for N/V x 3 days. Pt told EMS he had some blood in his vomit today. Pt has not had BM in 2 days. VSS with EMS. Pt was given 4 mg of Zofran and 400 CC of fluid. CBG 160. Pt is in NAD.

## 2019-05-10 NOTE — ED Notes (Signed)
Flush PIV to right hand, pt states that he prefers the fluids to go slower due to increased discomfort when running fast. Pt PIV intact, flushes with ease, no swelling, redness or irritation. Pt understand that he needs IV fluids to infuse before going home.

## 2019-07-13 ENCOUNTER — Inpatient Hospital Stay
Admission: EM | Admit: 2019-07-13 | Discharge: 2019-07-16 | DRG: 539 | Disposition: A | Discharge status: Home Health | Payer: Medicare Other | Attending: Internal Medicine | Admitting: Internal Medicine

## 2019-07-13 ENCOUNTER — Other Ambulatory Visit: Payer: Self-pay

## 2019-07-13 ENCOUNTER — Emergency Department: Payer: Medicare Other

## 2019-07-13 DIAGNOSIS — M8660 Other chronic osteomyelitis, unspecified site: Secondary | ICD-10-CM | POA: Diagnosis not present

## 2019-07-13 DIAGNOSIS — L89154 Pressure ulcer of sacral region, stage 4: Secondary | ICD-10-CM | POA: Diagnosis present

## 2019-07-13 DIAGNOSIS — E46 Unspecified protein-calorie malnutrition: Secondary | ICD-10-CM

## 2019-07-13 DIAGNOSIS — N319 Neuromuscular dysfunction of bladder, unspecified: Secondary | ICD-10-CM | POA: Diagnosis present

## 2019-07-13 DIAGNOSIS — Z681 Body mass index (BMI) 19 or less, adult: Secondary | ICD-10-CM | POA: Diagnosis not present

## 2019-07-13 DIAGNOSIS — N390 Urinary tract infection, site not specified: Secondary | ICD-10-CM

## 2019-07-13 DIAGNOSIS — Z89421 Acquired absence of other right toe(s): Secondary | ICD-10-CM

## 2019-07-13 DIAGNOSIS — D509 Iron deficiency anemia, unspecified: Secondary | ICD-10-CM

## 2019-07-13 DIAGNOSIS — M4628 Osteomyelitis of vertebra, sacral and sacrococcygeal region: Secondary | ICD-10-CM | POA: Diagnosis present

## 2019-07-13 DIAGNOSIS — B951 Streptococcus, group B, as the cause of diseases classified elsewhere: Secondary | ICD-10-CM | POA: Diagnosis present

## 2019-07-13 DIAGNOSIS — G8220 Paraplegia, unspecified: Secondary | ICD-10-CM | POA: Diagnosis present

## 2019-07-13 DIAGNOSIS — L89324 Pressure ulcer of left buttock, stage 4: Secondary | ICD-10-CM | POA: Diagnosis present

## 2019-07-13 DIAGNOSIS — Z87891 Personal history of nicotine dependence: Secondary | ICD-10-CM | POA: Diagnosis not present

## 2019-07-13 DIAGNOSIS — L89314 Pressure ulcer of right buttock, stage 4: Secondary | ICD-10-CM | POA: Diagnosis present

## 2019-07-13 DIAGNOSIS — Z807 Family history of other malignant neoplasms of lymphoid, hematopoietic and related tissues: Secondary | ICD-10-CM | POA: Diagnosis not present

## 2019-07-13 DIAGNOSIS — E440 Moderate protein-calorie malnutrition: Secondary | ICD-10-CM

## 2019-07-13 DIAGNOSIS — K5900 Constipation, unspecified: Secondary | ICD-10-CM

## 2019-07-13 DIAGNOSIS — N368 Other specified disorders of urethra: Secondary | ICD-10-CM | POA: Diagnosis present

## 2019-07-13 DIAGNOSIS — Z20822 Contact with and (suspected) exposure to covid-19: Secondary | ICD-10-CM | POA: Diagnosis present

## 2019-07-13 DIAGNOSIS — Z960 Presence of urogenital implants: Secondary | ICD-10-CM | POA: Diagnosis present

## 2019-07-13 DIAGNOSIS — L89159 Pressure ulcer of sacral region, unspecified stage: Secondary | ICD-10-CM

## 2019-07-13 LAB — COMPREHENSIVE METABOLIC PANEL
ALT: 14 U/L (ref 0–44)
AST: 15 U/L (ref 15–41)
Albumin: 4.3 g/dL (ref 3.5–5.0)
Alkaline Phosphatase: 91 U/L (ref 38–126)
Anion gap: 18 — ABNORMAL HIGH (ref 5–15)
BUN: 20 mg/dL (ref 6–20)
CO2: 21 mmol/L — ABNORMAL LOW (ref 22–32)
Calcium: 9.9 mg/dL (ref 8.9–10.3)
Chloride: 100 mmol/L (ref 98–111)
Creatinine, Ser: 0.55 mg/dL — ABNORMAL LOW (ref 0.61–1.24)
GFR calc Af Amer: >60 mL/min (ref >60)
GFR calc non Af Amer: >60 mL/min (ref >60)
Glucose, Bld: 131 mg/dL — ABNORMAL HIGH (ref 70–99)
Potassium: 3.7 mmol/L (ref 3.5–5.1)
Sodium: 139 mmol/L (ref 135–145)
Total Bilirubin: 1.2 mg/dL (ref 0.3–1.2)
Total Protein: 9.1 g/dL — ABNORMAL HIGH (ref 6.5–8.1)

## 2019-07-13 LAB — URINALYSIS, COMPLETE (UACMP) WITH MICROSCOPIC
Bacteria, UA: NONE SEEN
Bilirubin Urine: NEGATIVE
Glucose, UA: NEGATIVE mg/dL
Ketones, ur: 80 mg/dL — AB
Nitrite: NEGATIVE
Protein, ur: >=300 mg/dL — AB
RBC / HPF: >50 RBC/hpf — ABNORMAL HIGH (ref 0–5)
Specific Gravity, Urine: >1.046 — ABNORMAL HIGH (ref 1.005–1.030)
pH: 7 (ref 5.0–8.0)

## 2019-07-13 LAB — CBC
HCT: 41.9 % (ref 39.0–52.0)
Hemoglobin: 12.2 g/dL — ABNORMAL LOW (ref 13.0–17.0)
MCH: 19.9 pg — ABNORMAL LOW (ref 26.0–34.0)
MCHC: 29.1 g/dL — ABNORMAL LOW (ref 30.0–36.0)
MCV: 68.2 fL — ABNORMAL LOW (ref 80.0–100.0)
Platelets: 268 10*3/uL (ref 150–400)
RBC: 6.14 MIL/uL — ABNORMAL HIGH (ref 4.22–5.81)
RDW: 20.2 % — ABNORMAL HIGH (ref 11.5–15.5)
WBC: 15.2 10*3/uL — ABNORMAL HIGH (ref 4.0–10.5)
nRBC: 0 % (ref 0.0–0.2)

## 2019-07-13 LAB — HIV ANTIBODY (ROUTINE TESTING W REFLEX): HIV Screen 4th Generation wRfx: NONREACTIVE

## 2019-07-13 LAB — LIPASE, BLOOD: Lipase: 20 U/L (ref 11–51)

## 2019-07-13 LAB — LACTIC ACID, PLASMA: Lactic Acid, Venous: 1.2 mmol/L (ref 0.5–1.9)

## 2019-07-13 MED ORDER — SODIUM CHLORIDE 0.9% FLUSH
3.0000 mL | Freq: Once | INTRAVENOUS | Status: AC
  Administered 2019-07-13: 3 mL via INTRAVENOUS

## 2019-07-13 MED ORDER — IOHEXOL 300 MG/ML  SOLN
100.0000 mL | Freq: Once | INTRAMUSCULAR | Status: DC | PRN
  Filled 2019-07-13: qty 100

## 2019-07-13 MED ORDER — DIPHENHYDRAMINE HCL 50 MG/ML IJ SOLN
25.0000 mg | Freq: Once | INTRAMUSCULAR | Status: AC
  Administered 2019-07-13: 25 mg via INTRAVENOUS
  Filled 2019-07-13: qty 1

## 2019-07-13 MED ORDER — DOCUSATE SODIUM 100 MG PO CAPS: 100.0000 mg | ORAL_CAPSULE | Freq: Two times a day (BID) | ORAL | Status: DC | PRN

## 2019-07-13 MED ORDER — VANCOMYCIN HCL IN DEXTROSE 1-5 GM/200ML-% IV SOLN
1000.0000 mg | Freq: Once | INTRAVENOUS | Status: AC
  Administered 2019-07-13: 1000 mg via INTRAVENOUS
  Filled 2019-07-13: qty 200

## 2019-07-13 MED ORDER — SODIUM CHLORIDE 0.9 % IV SOLN: INTRAVENOUS | Status: DC

## 2019-07-13 MED ORDER — SODIUM CHLORIDE 0.9 % IV SOLN
1.0000 g | Freq: Once | INTRAVENOUS | Status: AC
  Administered 2019-07-13: 1 g via INTRAVENOUS
  Filled 2019-07-13: qty 10

## 2019-07-13 MED ORDER — MORPHINE SULFATE (PF) 2 MG/ML IV SOLN
2.0000 mg | Freq: Once | INTRAVENOUS | Status: AC
  Administered 2019-07-13: 2 mg via INTRAVENOUS
  Filled 2019-07-13: qty 1

## 2019-07-13 MED ORDER — BISACODYL 5 MG PO TBEC: 5.0000 mg | DELAYED_RELEASE_TABLET | Freq: Every day | ORAL | Status: DC | PRN

## 2019-07-13 MED ORDER — ONDANSETRON HCL 4 MG/2ML IJ SOLN
4.0000 mg | Freq: Four times a day (QID) | INTRAMUSCULAR | Status: DC | PRN
  Administered 2019-07-13 – 2019-07-16 (×5): 4 mg via INTRAVENOUS
  Filled 2019-07-13 (×5): qty 2

## 2019-07-13 MED ORDER — ONDANSETRON HCL 4 MG/2ML IJ SOLN
4.0000 mg | Freq: Once | INTRAMUSCULAR | Status: AC
  Administered 2019-07-13: 4 mg via INTRAVENOUS
  Filled 2019-07-13: qty 2

## 2019-07-13 MED ORDER — TRAMADOL HCL 50 MG PO TABS
50.0000 mg | ORAL_TABLET | Freq: Four times a day (QID) | ORAL | Status: DC | PRN
  Administered 2019-07-13 – 2019-07-15 (×4): 50 mg via ORAL
  Filled 2019-07-13 (×4): qty 1

## 2019-07-13 MED ORDER — IOHEXOL 300 MG/ML  SOLN
80.0000 mL | Freq: Once | INTRAMUSCULAR | Status: AC | PRN
  Administered 2019-07-13: 80 mL via INTRAVENOUS
  Filled 2019-07-13: qty 80

## 2019-07-13 MED ORDER — ACETAMINOPHEN 325 MG PO TABS: 650.0000 mg | ORAL_TABLET | Freq: Four times a day (QID) | ORAL | Status: DC | PRN

## 2019-07-13 MED ORDER — ONDANSETRON HCL 4 MG PO TABS
4.0000 mg | ORAL_TABLET | Freq: Four times a day (QID) | ORAL | Status: DC | PRN
  Filled 2019-07-13: qty 1

## 2019-07-13 MED ORDER — SODIUM CHLORIDE 0.9 % IV BOLUS
1000.0000 mL | Freq: Once | INTRAVENOUS | Status: AC
  Administered 2019-07-13: 1000 mL via INTRAVENOUS

## 2019-07-13 MED ORDER — VANCOMYCIN HCL 1250 MG/250ML IV SOLN
1250.0000 mg | INTRAVENOUS | Status: DC
  Administered 2019-07-14: 1250 mg via INTRAVENOUS
  Filled 2019-07-13: qty 250

## 2019-07-13 MED ORDER — ACETAMINOPHEN 650 MG RE SUPP: 650.0000 mg | Freq: Four times a day (QID) | RECTAL | Status: DC | PRN

## 2019-07-13 MED ORDER — SODIUM CHLORIDE 0.9 % IV SOLN
1.0000 g | INTRAVENOUS | Status: DC
  Administered 2019-07-14 – 2019-07-15 (×2): 1 g via INTRAVENOUS
  Filled 2019-07-13: qty 10
  Filled 2019-07-13 (×2): qty 1

## 2019-07-13 MED ORDER — MORPHINE SULFATE (PF) 2 MG/ML IV SOLN: 2.0000 mg | INTRAVENOUS | Status: DC | PRN

## 2019-07-13 MED ORDER — ENOXAPARIN SODIUM 30 MG/0.3ML ~~LOC~~ SOLN
30.0000 mg | SUBCUTANEOUS | Status: DC
  Administered 2019-07-13 – 2019-07-14 (×2): 30 mg via SUBCUTANEOUS
  Filled 2019-07-13 (×3): qty 0.3

## 2019-07-13 NOTE — H&P (Signed)
History and Physical    Jonathon York WJX:[id number] DOB: July 06, 1975 DOA: 07/13/2019  PCP: [facility]  Patient coming from: home   Chief Complaint: nausea, vomiting, sweating  HPI: 44 y/o M w/ PMH of paraplegic secondary to falling through a roof in 2001 who presents w/ nausea, vomiting and sweating x 2 days. Pt has been vomiting all day for the past 2 days. Furthermore, c/o sweating a lot recently & pt c/o worsening of his chronic sacral wounds x 8 years. Pt lives alone and has no friends or family to help. Pt sometimes has home health for a couple weeks to help with wound care but after then pt is on his own. Pt denies any fevers, chills, chest pain, shortness of breath, urinary frequency, urinary urgency, diarrhea, or constipation.   Review of Systems: As per HPI otherwise 10 point review of systems negative.    Past Medical History:  Diagnosis Date  . Anxiety   . Decubitus ulcer   . Depression   . Neurogenic bladder   . Osteomyelitis (HCC)   . 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 AMPUTATION Right 03/19/2018   Procedure: 5th Metatarsal Resection;  Surgeon: Recardo Evangelist, DPM;  Location: [facility] ORS;  Service: Podiatry;  Laterality: Right;  . 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 IRRIGATION AND DEBRIDEMENT BUTTOCKS    . TOE AMPUTATION Right      reports that he has quit smoking. His smoking use included cigarettes. He smoked 0.50 packs per day. He has never used smokeless tobacco. He reports current drug use. Drug: Marijuana. He reports that he does not drink alcohol.  Allergies  Allergen Reactions  . Orange Fruit [Citrus] Other (See Comments)    Blisters only from ORANGES!!! Patient is not allergic from all citrus    Family History  Problem Relation Age of Onset  . Lymphoma  Sister      Prior to Admission medications   Medication Sig Start Date End Date Taking? Authorizing Provider  ferrous sulfate 325 (65 FE) MG tablet Take 1 tablet (325 mg total) by mouth 2 (two) times daily with a meal. 11/30/18   Altamese Dilling, MD  hydrOXYzine (ATARAX/VISTARIL) 25 MG tablet Take 1 tablet (25 mg total) by mouth 3 (three) times daily as needed for itching. 11/30/18   Altamese Dilling, MD  ibuprofen (ADVIL) 200 MG tablet Take 400 mg by mouth every 6 (six) hours as needed for mild pain.    [provider]  Multiple Vitamin (MULTIVITAMIN WITH MINERALS) TABS tablet Take 1 tablet by mouth daily. 11/22/16   Altamese Dilling, MD  ondansetron (ZOFRAN ODT) 4 MG disintegrating tablet Take 1 tablet (4 mg total) by mouth every 8 (eight) hours as needed for nausea or vomiting. 05/10/19   Minna Antis, MD  vitamin C (VITAMIN C) 250 MG tablet Take 1 tablet (250 mg total) by mouth 2 (two) times daily. 11/30/18   Altamese Dilling, MD    Physical Exam: Vitals:   07/13/19 1303 07/13/19 1304  BP: 108/68   Pulse: 99   Resp: 16   Temp: 98.6 F (37 C)   TempSrc: Oral   SpO2: 99%   Weight:  49.9 kg  Height:  5\' 9"  (1.753 m)    Constitutional: NAD, calm, comfortable Vitals:   07/13/19 1303 07/13/19  1304  BP: 108/68   Pulse: 99   Resp: 16   Temp: 98.6 F (37 C)   TempSrc: Oral   SpO2: 99%   Weight:  49.9 kg  Height:  5\' 9"  (1.753 m)   Eyes: PERRL, lids and conjunctivae normal ENMT: Mucous membranes are moist. Posterior pharynx clear of any exudate or lesions. Neck: normal, supple Respiratory: diminished breath sounds b/l. Rhonchi b/l Cardiovascular: S1/S2+, noubs / gallops. No extremity edema.  Abdomen:  Soft, no tenderness, ND. hypoactive bowel sounds   Musculoskeletal: no  cyanosis. Atrophy of b/l LE. No sensation of b/l LE  Skin: sacral decubitus ulcer w/ pus draining intermittently, erythematous  Neurologic: moves b/l UE, normal ROM of b/l  UE. A & Ox4.  Psychiatric: Normal judgment and insight.Normal mood.     Labs on Admission: I have personally reviewed following labs and imaging studies  CBC: Recent Labs  Lab 07/13/19 1315  WBC 15.2*  HGB 12.2*  HCT 41.9  MCV 68.2*  PLT 401   Basic Metabolic Panel: Recent Labs  Lab 07/13/19 1315  NA 139  K 3.7  CL 100  CO2 21*  GLUCOSE 131*  BUN 20  CREATININE 0.55*  CALCIUM 9.9   GFR: Estimated Creatinine Clearance: 83.2 mL/min (A) (by C-G formula based on SCr of 0.55 mg/dL (L)). Liver Function Tests: Recent Labs  Lab 07/13/19 1315  AST 15  ALT 14  ALKPHOS 91  BILITOT 1.2  PROT 9.1*  ALBUMIN 4.3   Recent Labs  Lab 07/13/19 1315  LIPASE 20   No results for input(s): AMMONIA in the last 168 hours. Coagulation Profile: No results for input(s): INR, PROTIME in the last 168 hours. Cardiac Enzymes: No results for input(s): CKTOTAL, CKMB, CKMBINDEX, TROPONINI in the last 168 hours. BNP (last 3 results) No results for input(s): PROBNP in the last 8760 hours. HbA1C: No results for input(s): HGBA1C in the last 72 hours. CBG: No results for input(s): GLUCAP in the last 168 hours. Lipid Profile: No results for input(s): CHOL, HDL, LDLCALC, TRIG, CHOLHDL, LDLDIRECT in the last 72 hours. Thyroid Function Tests: No results for input(s): TSH, T4TOTAL, FREET4, T3FREE, THYROIDAB in the last 72 hours. Anemia Panel: No results for input(s): VITAMINB12, FOLATE, FERRITIN, TIBC, IRON, RETICCTPCT in the last 72 hours. Urine analysis:    Component Value Date/Time   COLORURINE YELLOW (A) 11/26/2018 0215   APPEARANCEUR HAZY (A) 11/26/2018 0215   APPEARANCEUR Turbid 12/24/2013 0313   LABSPEC 1.031 (H) 11/26/2018 0215   LABSPEC 1.021 12/24/2013 0313   PHURINE 6.0 11/26/2018 0215   GLUCOSEU NEGATIVE 11/26/2018 0215   GLUCOSEU Negative 12/24/2013 0313   HGBUR NEGATIVE 11/26/2018 0215   BILIRUBINUR NEGATIVE 11/26/2018 0215   BILIRUBINUR Negative 12/24/2013 0313    KETONESUR NEGATIVE 11/26/2018 0215   PROTEINUR NEGATIVE 11/26/2018 0215   NITRITE NEGATIVE 11/26/2018 0215   LEUKOCYTESUR MODERATE (A) 11/26/2018 0215   LEUKOCYTESUR 1+ 12/24/2013 0313    Radiological Exams on Admission: CT ABDOMEN PELVIS W CONTRAST  Result Date: 07/13/2019 CLINICAL DATA:  Sacral decubitus ulceration with concern for osteomyelitis. Abdominal pain with nausea and vomiting EXAM: CT ABDOMEN AND PELVIS WITH CONTRAST TECHNIQUE: Multidetector CT imaging of the abdomen and pelvis was performed using the standard protocol following bolus administration of intravenous contrast. CONTRAST:  52mL OMNIPAQUE IOHEXOL 300 MG/ML  SOLN COMPARISON:  May 10, 2019 FINDINGS: Lower chest: Lung bases are clear. Hepatobiliary: Subcentimeter cysts are again noted in the liver. No new liver lesions are evident. Gallbladder wall  is not appreciably thickened. There is no biliary duct dilatation. Pancreas: There is no pancreatic mass or inflammatory focus. Spleen: No splenic lesions are evident. Adrenals/Urinary Tract: Adrenals bilaterally appear unremarkable. There is a cyst in the anterior mid left kidney measuring 1.4 x 1.1 cm. There is a cyst in the lateral mid left kidney measuring 1.2 x 1.2 cm. There is a 5 mm cyst in the mid left kidney. There is no evident hydronephrosis on either side. There is no evident renal or ureteral calculus on either side. The urinary bladder contains air with wall thickening. A Foley catheter is present which does not extend beyond the level of the prostatic urethra. Foley balloon is noted at the prostatic urethra level. Stomach/Bowel: The rectum is distended with stool. Rectal wall does not appear significantly thickened, and there is no appreciable perirectal soft tissue stranding. Elsewhere there is moderate stool in the colon. There is no appreciable bowel dilatation or bowel wall thickening. No evident bowel obstruction. The terminal ileum appears normal. There is no  pneumoperitoneum or portal venous air. Vascular/Lymphatic: No abdominal aortic aneurysm. There is aortic and common iliac artery atherosclerosis. There is extensive atherosclerosis at the origins of each common iliac artery. Major venous structures appear patent. There are multiple lymph nodes in the obturator node chain region which do not appear enlarged and likely have reactive etiology given other changes in the pelvis and perineal regions. These changes are stable. There are also borderline prominent inguinal lymph nodes, stable and likely of reactive etiology. Reproductive: The prostate and seminal vesicles appear normal in size and contour. No evident pelvic mass. Other: There is again noted air tracking at the base of the Foley catheter in the region of the prostatic urethra, a stable finding. This area has not progressed. There is marked soft tissue thickening with decubitus ulceration in the pelvis with stable peroneal thickening. Soft tissue air is also noted in the right perineum-thigh junction with soft tissue thickening in this area consistent with chronic changes from decubitus ulceration. There is no intraperitoneal or retroperitoneal abscess or ascites. No appendiceal region inflammation evident. Musculoskeletal: Sclerosis due to chronic osteomyelitis is noted in each medial ischium extending to the pubic symphysis. Erosion at each ischial tuberosity is noted with sclerosis in this region on the right, consistent with chronic osteomyelitis. There is sclerosis consistent with chronic osteomyelitis involving the coccyx distally. 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 Sclerosis in the inferior left iliac bone is stable and probably represents residua of chronic osteomyelitis. No new osteomyelitis evident compared to the previous study. No acute fracture or dislocation. No intramuscular lesions evident beyond advanced atrophy throughout the  perisacral musculature. There is essentially complete fatty replacement in this region. There remains soft tissue thickening on the left posteriorly and inferiorly at the base of the penis, likely due to decubitus ulceration and chronic inflammation. Appearance stable. There is postoperative change in the lower thoracic and upper lumbar regions. IMPRESSION: 1. In comparison with the previous study, the Foley catheter no longer extends into the urinary bladder. The balloon is distended at the level of the prostatic urethra. Within the urinary bladder, there is soft tissue air which likely is due to gas-forming organism. There is also wall thickening in the bladder consistent with cystitis. 2.  Rectum distended with stool.  No obvious stercoral proctitis. 3. Chronic osteomyelitis involving the coccyx as well as portions of the inferior left iliac bone and ischia, particularly  medially on each side. No acute appearing bony destruction evident. 4. Extraperitoneal air is noted at the base of the penis with soft tissue thickening in this area. These changes are chronic and stable and likely secondary to the chronic decubitus ulceration. Air in the right region of right proximal thigh remains consistent with chronic inflammation. Advanced muscle atrophy is noted throughout these areas, particularly the Peri sacral regions, stable. 5. There is no intraperitoneal or retroperitoneal abscess. No bowel obstruction or periappendiceal region inflammation. 6.  No evident renal or ureteral calculus.  No hydronephrosis. 7. Aortic Atherosclerosis (ICD10-I70.0). Suspected hemodynamically significant obstruction at the origins of the iliac arteries due to calcifications/plaque. Electronically Signed   By: Bretta Bang III M.D.   On: 07/13/2019 15:08    EKG: Independently reviewed.   Assessment/Plan Active Problems:   * No active hospital problems. *  Sacral decubitus ulcer: present on admission. Chronic osteomyelitis as  noted on CT scan. Wound care consulted. General surgery consulted for possible debridement. Will continue on IV rocephin & vanco. Blood cxs ordered  Possible UTI: pt has chronic foley. UA is positive, urine cx is ordered. Continue on IV abxs. Ordered placed to have foley changed as it is in the prostatic urethra as per CT scan   Constipation: continue w/ colace, dulcolax  Leukocytosis: likely secondary to infection. Continue on IV abxs  Iron deficiency anemia: continue on iron supplements  Likely severe protein malnutrition: BMI 16.2. Will consult nutrition   Paraplegic: since 2001 after the pt through a roof. Continue w/ supportive care  DVT prophylaxis: lovenox Code Status: full  Family Communication:  Disposition Plan:  Consults called: general surgery, Dr. Aleen Campi  Admission status: inpatient    Charise Killian MD [facility] Pager [phone]   If 7PM-7AM, please contact night-coverage [email or web address]   07/13/2019, 4:12 PM

## 2019-07-13 NOTE — ED Notes (Signed)
Pt states he changes his foley himself every 3-4 weeks. States last change was 2 weeks ago. States he irrigates it every few days.

## 2019-07-13 NOTE — ED Notes (Signed)
Attempted to call report at this time, RN instructed to please look at ED-IP handoff and call back before the 15 minute mark.

## 2019-07-13 NOTE — ED Triage Notes (Addendum)
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 Pt states he has chronic wounds of the sacral buttock area. Pt has an indwelling catheter on arrival.

## 2019-07-13 NOTE — Progress Notes (Signed)
Assumed care of patient this evening. Patient alert oriented and pleasant. Patient did have c/o buttock burning and nausea. Patient appears to have extensive sacral wounds noted. Mepilex applied to the area. It also appeared the far right wound was weeping clear fluid.  Safety checks completed and call light placed within reach.

## 2019-07-13 NOTE — ED Notes (Signed)
PT COVID SWABBED AT THIS TIME AND WALKED TO LAB, AWAITING ORDER. ASKED HOSPITALIST TO ORDER SWAB.

## 2019-07-13 NOTE — ED Notes (Signed)
This RN attempted to establish a PIV 2x.

## 2019-07-13 NOTE — ED Provider Notes (Signed)
[facility] Emergency Department Provider Note  ____________________________________________   First MD Initiated Contact with Patient 07/13/19 1331     (approximate)  I have reviewed the triage vital signs and the nursing notes.   HISTORY  Chief Complaint Abdominal Pain   HPI Jonathon LeedsRichard L Osterman is a 44 y.o. male with below list of previous medical conditions presents emergency department secondary to concern for sacral decubitus ulcer infection as patient has noted foul odor and greenish-yellow drainage.  Patient also admits to generalized abdominal pain nausea and vomiting as well which began yesterday.  Patient denies any fever.       Past Medical History:  Diagnosis Date  . Anxiety   . Decubitus ulcer   . Depression   . Neurogenic bladder   . Osteomyelitis (HCC)   . 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 Sacral decubitus ulcer 07/13/2019  . Intractable pain 11/26/2018  . Anxiety 04/22/2018  . Acute osteomyelitis of right foot (HCC) 03/18/2018  . Pressure injury of skin 03/18/2018  . 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 Protein-calorie malnutrition, severe 11/20/2016  . Herpes simplex infection of penis   . Decubitus ulcer 11/19/2016  . 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 Decubitus ulcer of sacral region   . Sepsis (HCC) 04/15/2016  . 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 Pressure ulcer stage III 04/30/2015  . Sterile pyuria 04/30/2015  . Paraplegia (HCC) 04/30/2015  . Neurogenic bladder 04/30/2015  . 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 Malnutrition of moderate degree 04/28/2015    Past Surgical History:  Procedure Laterality Date  . AMPUTATION Right 03/19/2018   Procedure: 5th Metatarsal Resection;  Surgeon: Recardo Evangelistroxler, Matthew, DPM;  Location: [facility] ORS;  Service: Podiatry;   Laterality: Right;  . AMPUTATION TOE Right 04/29/2015   Procedure: AMPUTATION TOE;  Surgeon: Linus Galasodd Cline, MD;  Location: [facility] ORS;  Service: Podiatry;  Laterality: Right;  . IRRIGATION AND DEBRIDEMENT BUTTOCKS    . TOE AMPUTATION Right     Prior to Admission medications   Not on File    Allergies Orange fruit [citrus]  Family History  Problem Relation Age of Onset  . Lymphoma Sister     Social History Social History   Tobacco Use  . Smoking status: Former Smoker    Packs/day: 0.50    Types: Cigarettes  . Smokeless tobacco: Never Used  Substance Use Topics  . Alcohol use: No  . Drug use: Yes    Types: Marijuana    Comment: 2 days ago    Review of Systems Constitutional: No fever/chills Eyes: No visual changes. ENT: No sore throat. Cardiovascular: Denies chest pain. Respiratory: Denies shortness of breath. Gastrointestinal: Positive for abdominal pain nausea and vomiting Genitourinary: Negative for dysuria. Musculoskeletal: Negative for neck pain.  Negative for back pain. Integumentary: Negative for rash. Neurological: Negative for headaches, focal weakness or numbness.   ____________________________________________   PHYSICAL EXAM:  VITAL SIGNS: ED Triage Vitals  Enc Vitals Group     BP 07/13/19 1303 108/68     Pulse Rate 07/13/19 1303 99     Resp 07/13/19 1303 16     Temp 07/13/19 1303 98.6 F (37 C)     Temp Source 07/13/19 1303 Oral     SpO2 07/13/19 1303 99 %     Weight 07/13/19 1304 49.9  kg (110 lb)     Height 07/13/19 1304 1.753 m (5\' 9" )     Head Circumference --      Peak Flow --      Pain Score 07/13/19 1304 9     Pain Loc --      Pain Edu? --      Excl. in GC? --     Constitutional: Alert and oriented.  Eyes: Conjunctivae are normal.  Mouth/Throat: Patient is wearing a mask. Neck: No stridor.  No meningeal signs.   Cardiovascular: Normal rate, regular rhythm. Good peripheral circulation. Grossly normal heart sounds. Respiratory:  Normal respiratory effort.  No retractions. Gastrointestinal: Soft and nontender. No distention.  Musculoskeletal: No lower extremity tenderness nor edema. No gross deformities of extremities. Neurologic:  Normal speech and language. No gross focal neurologic deficits are appreciated.  Skin: large malodorous with greenish-yellow drainage sacral decubitus ulcer noted.  Bilateral ischial tuberosity stage IV decubitus ulcers also noted again with malodor and greenish-yellow drainage Psychiatric: Mood and affect are normal. Speech and behavior are normal.  ____________________________________________   LABS (all labs ordered are listed, but only abnormal results are displayed)  Labs Reviewed  COMPREHENSIVE METABOLIC PANEL - Abnormal; Notable for the following components:      Result Value   CO2 21 (*)    Glucose, Bld 131 (*)    Creatinine, Ser 0.55 (*)    Total Protein 9.1 (*)    Anion gap 18 (*)    All other components within normal limits  CBC - Abnormal; Notable for the following components:   WBC 15.2 (*)    RBC 6.14 (*)    Hemoglobin 12.2 (*)    MCV 68.2 (*)    MCH 19.9 (*)    MCHC 29.1 (*)    RDW 20.2 (*)    All other components within normal limits  URINE CULTURE  CULTURE, BLOOD (ROUTINE X 2)  CULTURE, BLOOD (ROUTINE X 2)  LIPASE, BLOOD  LACTIC ACID, PLASMA  URINALYSIS, COMPLETE (UACMP) WITH MICROSCOPIC  HIV ANTIBODY (ROUTINE TESTING W REFLEX)  BASIC METABOLIC PANEL  CBC   ______________________________  RADIOLOGY I, Pax N Yatzil Clippinger, personally viewed and evaluated these images (plain radiographs) as part of my medical decision making, as well as reviewing the written report by the radiologist.  ED MD interpretation:    Official radiology report(s): CT ABDOMEN PELVIS W CONTRAST  Result Date: 07/13/2019 CLINICAL DATA:  Sacral decubitus ulceration with concern for osteomyelitis. Abdominal pain with nausea and vomiting EXAM: CT ABDOMEN AND PELVIS WITH CONTRAST  TECHNIQUE: Multidetector CT imaging of the abdomen and pelvis was performed using the standard protocol following bolus administration of intravenous contrast. CONTRAST:  89mL OMNIPAQUE IOHEXOL 300 MG/ML  SOLN COMPARISON:  May 10, 2019 FINDINGS: Lower chest: Lung bases are clear. Hepatobiliary: Subcentimeter cysts are again noted in the liver. No new liver lesions are evident. Gallbladder wall is not appreciably thickened. There is no biliary duct dilatation. Pancreas: There is no pancreatic mass or inflammatory focus. Spleen: No splenic lesions are evident. Adrenals/Urinary Tract: Adrenals bilaterally appear unremarkable. There is a cyst in the anterior mid left kidney measuring 1.4 x 1.1 cm. There is a cyst in the lateral mid left kidney measuring 1.2 x 1.2 cm. There is a 5 mm cyst in the mid left kidney. There is no evident hydronephrosis on either side. There is no evident renal or ureteral calculus on either side. The urinary bladder contains air with wall thickening. A Foley catheter is  present which does not extend beyond the level of the prostatic urethra. Foley balloon is noted at the prostatic urethra level. Stomach/Bowel: The rectum is distended with stool. Rectal wall does not appear significantly thickened, and there is no appreciable perirectal soft tissue stranding. Elsewhere there is moderate stool in the colon. There is no appreciable bowel dilatation or bowel wall thickening. No evident bowel obstruction. The terminal ileum appears normal. There is no pneumoperitoneum or portal venous air. Vascular/Lymphatic: No abdominal aortic aneurysm. There is aortic and common iliac artery atherosclerosis. There is extensive atherosclerosis at the origins of each common iliac artery. Major venous structures appear patent. There are multiple lymph nodes in the obturator node chain region which do not appear enlarged and likely have reactive etiology given other changes in the pelvis and perineal regions.  These changes are stable. There are also borderline prominent inguinal lymph nodes, stable and likely of reactive etiology. Reproductive: The prostate and seminal vesicles appear normal in size and contour. No evident pelvic mass. Other: There is again noted air tracking at the base of the Foley catheter in the region of the prostatic urethra, a stable finding. This area has not progressed. There is marked soft tissue thickening with decubitus ulceration in the pelvis with stable peroneal thickening. Soft tissue air is also noted in the right perineum-thigh junction with soft tissue thickening in this area consistent with chronic changes from decubitus ulceration. There is no intraperitoneal or retroperitoneal abscess or ascites. No appendiceal region inflammation evident. Musculoskeletal: Sclerosis due to chronic osteomyelitis is noted in each medial ischium extending to the pubic symphysis. Erosion at each ischial tuberosity is noted with sclerosis in this region on the right, consistent with chronic osteomyelitis. There is sclerosis consistent with chronic osteomyelitis involving the coccyx distally. 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 Sclerosis in the inferior left iliac bone is stable and probably represents residua of chronic osteomyelitis. No new osteomyelitis evident compared to the previous study. No acute fracture or dislocation. No intramuscular lesions evident beyond advanced atrophy throughout the perisacral musculature. There is essentially complete fatty replacement in this region. There remains soft tissue thickening on the left posteriorly and inferiorly at the base of the penis, likely due to decubitus ulceration and chronic inflammation. Appearance stable. There is postoperative change in the lower thoracic and upper lumbar regions. IMPRESSION: 1. In comparison with the previous study, the Foley catheter no longer extends into the  urinary bladder. The balloon is distended at the level of the prostatic urethra. Within the urinary bladder, there is soft tissue air which likely is due to gas-forming organism. There is also wall thickening in the bladder consistent with cystitis. 2.  Rectum distended with stool.  No obvious stercoral proctitis. 3. Chronic osteomyelitis involving the coccyx as well as portions of the inferior left iliac bone and ischia, particularly medially on each side. No acute appearing bony destruction evident. 4. Extraperitoneal air is noted at the base of the penis with soft tissue thickening in this area. These changes are chronic and stable and likely secondary to the chronic decubitus ulceration. Air in the right region of right proximal thigh remains consistent with chronic inflammation. Advanced muscle atrophy is noted throughout these areas, particularly the Peri sacral regions, stable. 5. There is no intraperitoneal or retroperitoneal abscess. No bowel obstruction or periappendiceal region inflammation. 6.  No evident renal or ureteral calculus.  No hydronephrosis. 7. Aortic Atherosclerosis (ICD10-I70.0). Suspected hemodynamically significant  obstruction at the origins of the iliac arteries due to calcifications/plaque. Electronically Signed   By: Bretta Bang III M.D.   On: 07/13/2019 15:08      Procedures   ____________________________________________   INITIAL IMPRESSION / MDM / ASSESSMENT AND PLAN / ED COURSE  As part of my medical decision making, I reviewed the following data within the electronic MEDICAL RECORD NUMBER 44 year old male presented with above-stated history and physical exam concerning for infected sacral/bilateral ischial tuberosity decubitus ulcer.  Patient states that he has been caring for these wounds by himself.  Patient states that he would like to be placed in a facility and that he has been placed before however he left secondary to lack of care".  In addition patient's  urine at bedside turbid in appearance, UA and CT scan findings consistent with possible urinary tract infection.  Patient given IV vancomycin and ceftriaxone on evaluation in the emergency department patient discussed with Dr. Mayford Knife for hospital admission for further evaluation and management      ____________________________________________  FINAL CLINICAL IMPRESSION(S) / ED DIAGNOSES  Final diagnoses:  Sacral decubitus ulcer, stage IV (HCC)  Urinary tract infection   MEDICATIONS GIVEN DURING THIS VISIT:  Medications  enoxaparin (LOVENOX) injection 30 mg (has no administration in time range)  acetaminophen (TYLENOL) tablet 650 mg (has no administration in time range)    Or  acetaminophen (TYLENOL) suppository 650 mg (has no administration in time range)  traMADol (ULTRAM) tablet 50 mg (has no administration in time range)  bisacodyl (DULCOLAX) EC tablet 5 mg (has no administration in time range)  ondansetron (ZOFRAN) tablet 4 mg (has no administration in time range)    Or  ondansetron (ZOFRAN) injection 4 mg (has no administration in time range)  cefTRIAXone (ROCEPHIN) 1 g in sodium chloride 0.9 % 100 mL IVPB (has no administration in time range)  morphine 2 MG/ML injection 2 mg (has no administration in time range)  vancomycin (VANCOREADY) IVPB 1250 mg/250 mL (has no administration in time range)  docusate sodium (COLACE) capsule 100 mg (has no administration in time range)  0.9 %  sodium chloride infusion (has no administration in time range)  sodium chloride flush (NS) 0.9 % injection 3 mL (3 mLs Intravenous Given 07/13/19 1501)  vancomycin (VANCOCIN) IVPB 1000 mg/200 mL premix (0 mg Intravenous Stopped 07/13/19 1600)  cefTRIAXone (ROCEPHIN) 1 g in sodium chloride 0.9 % 100 mL IVPB (0 g Intravenous Stopped 07/13/19 1454)  sodium chloride 0.9 % bolus 1,000 mL (0 mLs Intravenous Stopped 07/13/19 1641)  morphine 2 MG/ML injection 2 mg (2 mg Intravenous Given 07/13/19 1409)    ondansetron (ZOFRAN) injection 4 mg (4 mg Intravenous Given 07/13/19 1411)  iohexol (OMNIPAQUE) 300 MG/ML solution 80 mL (80 mLs Intravenous Contrast Given 07/13/19 1424)  diphenhydrAMINE (BENADRYL) injection 25 mg (25 mg Intravenous Given 07/13/19 1611)     ED Discharge Orders    None      *Please note:  Jonathon York was evaluated in Emergency Department on 07/13/2019 for the symptoms described in the history of present illness. He was evaluated in the context of the global COVID-19 pandemic, which necessitated consideration that the patient might be at risk for infection with the SARS-CoV-2 virus that causes COVID-19. Institutional protocols and algorithms that pertain to the evaluation of patients at risk for COVID-19 are in a state of rapid change based on information released by regulatory bodies including the CDC and federal and state organizations. These policies and algorithms  were followed during the patient's care in the ED.  Some ED evaluations and interventions may be delayed as a result of limited staffing during the pandemic.*  Note:  This document was prepared using Dragon voice recognition software and may include unintentional dictation errors.   Darci Current, MD 07/13/19 [phone]

## 2019-07-13 NOTE — Progress Notes (Signed)
PHARMACIST - PHYSICIAN COMMUNICATION  CONCERNING:  Enoxaparin (Lovenox) for DVT Prophylaxis    RECOMMENDATION: Patient was prescribed enoxaparin 40mg  q24 hours for VTE prophylaxis.   Filed Weights   07/13/19 1304  Weight: 110 lb (49.9 kg)    Body mass index is 16.24 kg/m.  Estimated Creatinine Clearance: 83.2 mL/min (A) (by C-G formula based on SCr of 0.55 mg/dL (L)).   Patient is candidate for enoxaparin 30mg  every 24 hours based on CrCl <53ml/min or Weight less then 45kg for women or <57kg for men   DESCRIPTION: Pharmacy has adjusted enoxaparin dose per [facility] policy.  Patient is now receiving enoxaparin 30mg  every 24 hours.  31m Pharmacy Resident 07/13/2019 4:50 PM

## 2019-07-13 NOTE — Consult Note (Signed)
Pharmacy Antibiotic Note  Jonathon York is a 44 y.o. male admitted on 07/13/2019 with infected sacral decubitus ulcer/cellulitis.    Pharmacy has been consulted for Vancomycin dosing.  Plan: Patient received Vancomycin 1g IV x 1 in ED  Will start:  Vancomycin 1250 mg IV Q 24 hrs. Goal AUC 400-550. Expected AUC: 473 SCr used: 0.8 (actual Scr 0.55)   Height: 5\' 9"  (175.3 cm) Weight: 110 lb (49.9 kg) IBW/kg (Calculated) : 70.7  Temp (24hrs), Avg:98.6 F (37 C), Min:98.6 F (37 C), Max:98.6 F (37 C)  Recent Labs  Lab 07/13/19 1315 07/13/19 1502  WBC 15.2*  --   CREATININE 0.55*  --   LATICACIDVEN  --  1.2    Estimated Creatinine Clearance: 83.2 mL/min (A) (by C-G formula based on SCr of 0.55 mg/dL (L)).    Allergies  Allergen Reactions  . Orange Fruit [Citrus] Other (See Comments)    Blisters only from ORANGES!!! Patient is not allergic from all citrus    Antimicrobials this admission: Ceftriaxone 3/16 >> Vancomycin 3/16 >>  Dose adjustments this admission: N/A  Microbiology results: 3/16 UCx pending 3/16 BCx pending  Thank you for allowing pharmacy to be a part of this patient's care.  4/16, PharmD, BCPS Clinical Pharmacist 07/13/2019 4:43 PM

## 2019-07-14 ENCOUNTER — Telehealth: Payer: Self-pay | Admitting: Urology

## 2019-07-14 DIAGNOSIS — L89154 Pressure ulcer of sacral region, stage 4: Secondary | ICD-10-CM

## 2019-07-14 DIAGNOSIS — E440 Moderate protein-calorie malnutrition: Secondary | ICD-10-CM

## 2019-07-14 DIAGNOSIS — D509 Iron deficiency anemia, unspecified: Secondary | ICD-10-CM

## 2019-07-14 DIAGNOSIS — K5900 Constipation, unspecified: Secondary | ICD-10-CM

## 2019-07-14 DIAGNOSIS — M8660 Other chronic osteomyelitis, unspecified site: Secondary | ICD-10-CM

## 2019-07-14 LAB — CBC
HCT: 32.2 % — ABNORMAL LOW (ref 39.0–52.0)
Hemoglobin: 9.5 g/dL — ABNORMAL LOW (ref 13.0–17.0)
MCH: 20 pg — ABNORMAL LOW (ref 26.0–34.0)
MCHC: 29.5 g/dL — ABNORMAL LOW (ref 30.0–36.0)
MCV: 67.6 fL — ABNORMAL LOW (ref 80.0–100.0)
Platelets: 365 10*3/uL (ref 150–400)
RBC: 4.76 MIL/uL (ref 4.22–5.81)
RDW: 19 % — ABNORMAL HIGH (ref 11.5–15.5)
WBC: 8.7 10*3/uL (ref 4.0–10.5)
nRBC: 0 % (ref 0.0–0.2)

## 2019-07-14 LAB — BASIC METABOLIC PANEL
Anion gap: 8 (ref 5–15)
BUN: 15 mg/dL (ref 6–20)
CO2: 25 mmol/L (ref 22–32)
Calcium: 8.3 mg/dL — ABNORMAL LOW (ref 8.9–10.3)
Chloride: 108 mmol/L (ref 98–111)
Creatinine, Ser: 0.44 mg/dL — ABNORMAL LOW (ref 0.61–1.24)
GFR calc Af Amer: >60 mL/min (ref >60)
GFR calc non Af Amer: >60 mL/min (ref >60)
Glucose, Bld: 92 mg/dL (ref 70–99)
Potassium: 3.1 mmol/L — ABNORMAL LOW (ref 3.5–5.1)
Sodium: 141 mmol/L (ref 135–145)

## 2019-07-14 LAB — SARS CORONAVIRUS 2 (TAT 6-24 HRS): SARS Coronavirus 2: NEGATIVE

## 2019-07-14 LAB — MRSA PCR SCREENING: MRSA by PCR: NEGATIVE

## 2019-07-14 MED ORDER — ASCORBIC ACID 500 MG PO TABS
500.0000 mg | ORAL_TABLET | Freq: Two times a day (BID) | ORAL | Status: DC
  Administered 2019-07-14 – 2019-07-16 (×4): 500 mg via ORAL
  Filled 2019-07-14 (×4): qty 1

## 2019-07-14 MED ORDER — METRONIDAZOLE 500 MG PO TABS
500.0000 mg | ORAL_TABLET | Freq: Three times a day (TID) | ORAL | Status: DC
  Administered 2019-07-14 – 2019-07-16 (×5): 500 mg via ORAL
  Filled 2019-07-14 (×7): qty 1

## 2019-07-14 MED ORDER — OCUVITE-LUTEIN PO CAPS
1.0000 | ORAL_CAPSULE | Freq: Every day | ORAL | Status: DC
  Administered 2019-07-15: 1 via ORAL
  Filled 2019-07-14 (×2): qty 1

## 2019-07-14 MED ORDER — CHLORHEXIDINE GLUCONATE CLOTH 2 % EX PADS
6.0000 | MEDICATED_PAD | Freq: Every day | CUTANEOUS | Status: DC
  Administered 2019-07-14 – 2019-07-15 (×2): 6 via TOPICAL

## 2019-07-14 MED ORDER — ENSURE ENLIVE PO LIQD
237.0000 mL | Freq: Three times a day (TID) | ORAL | Status: DC
  Administered 2019-07-14 (×2): 237 mL via ORAL

## 2019-07-14 MED ORDER — VANCOMYCIN HCL 750 MG/150ML IV SOLN
750.0000 mg | Freq: Two times a day (BID) | INTRAVENOUS | Status: DC
  Administered 2019-07-14 – 2019-07-16 (×4): 750 mg via INTRAVENOUS
  Filled 2019-07-14 (×5): qty 150

## 2019-07-14 MED ORDER — METRONIDAZOLE 500 MG PO TABS
500.0000 mg | ORAL_TABLET | Freq: Three times a day (TID) | ORAL | Status: DC
  Filled 2019-07-14 (×3): qty 1
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
## 2019-07-14 NOTE — Telephone Encounter (Signed)
This is currently an inpatient.  He was seen by Dr. Richardo Hanks for the same thing in July 2020.  I do see that outpatient referral was placed to [facility]rology for reconstructive evaluation.  The referral was faxed but never scheduled.  Please place another referral for the same thing and ensure that it gets scheduled.  Vanna Scotland, MD

## 2019-07-14 NOTE — Consult Note (Signed)
Infectious Disease     Reason for Consult: Sacral osteomyelitis    Referring Physician: Dr Earleen Newport Date of Admission:  07/13/2019   Active Problems:   Sacral decubitus ulcer, stage IV ([place])   HPI: Jonathon York is a 44 y.o. male with paraplegia dealing with chronic sacral wounds for several years, as well as chronic indwelling foley. He provides his own wound care at home.  He has had issues with urinary drainage from one of wounds and was previously referred to [facility] for eval for surgery for this but says it was never set up.  Per surgery eval here the wound bases appear healthy and no surgery needed. On admit wbc 15, no fevers.  Bcx neg, wound cx pending.  No other recent wound cxs done. CT shows chronic osteomyelitis.   Past Medical History:  Diagnosis Date  . Anxiety   . Decubitus ulcer   . Depression   . Neurogenic bladder   . Osteomyelitis ([place])   . Paraparesis of both lower limbs ([place]) 02/21/00   Past Surgical History:  Procedure Laterality Date  . AMPUTATION Right 03/19/2018   Procedure: 5th Metatarsal Resection;  Surgeon: Albertine Patricia, DPM;  Location: [facility] ORS;  Service: Podiatry;  Laterality: Right;  . AMPUTATION TOE Right 04/29/2015   Procedure: AMPUTATION TOE;  Surgeon: Sharlotte Alamo, MD;  Location: [facility] ORS;  Service: Podiatry;  Laterality: Right;  . IRRIGATION AND DEBRIDEMENT BUTTOCKS    . TOE AMPUTATION Right    Social History   Tobacco Use  . Smoking status: Former Smoker    Packs/day: 0.50    Types: Cigarettes  . Smokeless tobacco: Never Used  Substance Use Topics  . Alcohol use: No  . Drug use: Yes    Types: Marijuana    Comment: 2 days ago   Family History  Problem Relation Age of Onset  . Lymphoma Sister     Allergies:  Allergies  Allergen Reactions  . Orange Fruit [Citrus] Other (See Comments)    Blisters only from [place]!!! Patient is not allergic from all citrus    Current antibiotics: Antibiotics Given (last 72 hours)    Date/Time  Action Medication Dose Rate   07/13/19 1413 New Bag/Given   cefTRIAXone (ROCEPHIN) 1 g in sodium chloride 0.9 % 100 mL IVPB 1 g 200 mL/hr   07/13/19 1500 New Bag/Given   vancomycin (VANCOCIN) IVPB 1000 mg/200 mL premix 1,000 mg 200 mL/hr   07/14/19 1040 New Bag/Given   vancomycin (VANCOREADY) IVPB 1250 mg/250 mL 1,250 mg 166.7 mL/hr      MEDICATIONS: . vitamin C  500 mg Oral BID  . Chlorhexidine Gluconate Cloth  6 each Topical Daily  . enoxaparin (LOVENOX) injection  30 mg Subcutaneous Q24H  . feeding supplement (ENSURE ENLIVE)  237 mL Oral TID BM  . [START ON 07/15/2019] multivitamin-lutein  1 capsule Oral Daily    Review of Systems - 11 systems reviewed and negative per HPI   OBJECTIVE: Temp:  [98 F (36.7 C)-99 F (37.2 C)] 98 F (36.7 C) (03/17 1301) Pulse Rate:  [58-90] 58 (03/17 1301) Resp:  [16-18] 16 (03/17 1301) BP: (92-110)/(54-75) 110/66 (03/17 1301) SpO2:  [97 %-100 %] 100 % (03/17 1301) Weight:  [48.4 kg] 48.4 kg (03/17 1241) Physical Exam  Constitutional: He is oriented to person, place, and time. Thin Mouth/Throat: Oropharynx is clear and moist. No oropharyngeal exudate.  Cardiovascular: Normal rate, regular rhythm and normal heart sounds. Exam reveals no gallop and no friction rub.  No murmur heard.  Pulmonary/Chest: Effort normal and breath sounds normal. No respiratory distress. He has no wheezes.  Abdominal: Soft. Bowel sounds are normal. He exhibits no distension. There is no tenderness.  Lymphadenopathy: He has no cervical adenopathy.  Neurological: paraplegic Skin: wounds eval, clean base, no purulence. No. Obvious urine drainage Psychiatric: He has a normal mood and affect. His behavior is normal.     LABS: Results for orders placed or performed during the hospital encounter of 07/13/19 (from the past 48 hour(s))  Lipase, blood     Status: None   Collection Time: 07/13/19  1:15 PM  Result Value Ref Range   Lipase 20 11 - 51 U/L    Comment:  Performed at [facility], [address] [place], [place] [address]  Comprehensive metabolic panel     Status: Abnormal   Collection Time: 07/13/19  1:15 PM  Result Value Ref Range   Sodium 139 135 - 145 mmol/L    Comment: RESULT REPEATED AND VERIFIED   Potassium 3.7 3.5 - 5.1 mmol/L   Chloride 100 98 - 111 mmol/L   CO2 21 (L) 22 - 32 mmol/L   Glucose, Bld 131 (H) 70 - 99 mg/dL    Comment: Glucose reference range applies only to samples taken after fasting for at least 8 hours.   BUN 20 6 - 20 mg/dL   Creatinine, Ser 0.55 (L) 0.61 - 1.24 mg/dL   Calcium 9.9 8.9 - 10.3 mg/dL   Total Protein 9.1 (H) 6.5 - 8.1 g/dL   Albumin 4.3 3.5 - 5.0 g/dL   AST 15 15 - 41 U/L   ALT 14 0 - 44 U/L   Alkaline Phosphatase 91 38 - 126 U/L   Total Bilirubin 1.2 0.3 - 1.2 mg/dL   GFR calc non Af Amer >60 >60 mL/min   GFR calc Af Amer >60 >60 mL/min   Anion gap 18 (H) 5 - 15    Comment: Performed at [facility], [address] [place], [place] [address]  CBC     Status: Abnormal   Collection Time: 07/13/19  1:15 PM  Result Value Ref Range   WBC 15.2 (H) 4.0 - 10.5 K/uL   RBC 6.14 (H) 4.22 - 5.81 MIL/uL   Hemoglobin 12.2 (L) 13.0 - 17.0 g/dL   HCT 41.9 39.0 - 52.0 %   MCV 68.2 (L) 80.0 - 100.0 fL   MCH 19.9 (L) 26.0 - 34.0 pg   MCHC 29.1 (L) 30.0 - 36.0 g/dL   RDW 20.2 (H) 11.5 - 15.5 %   Platelets 268 150 - 400 K/uL   nRBC 0.0 0.0 - 0.2 %    Comment: Performed at [facility], [address] [place], [place] [address]  Urinalysis, Complete w Microscopic     Status: Abnormal   Collection Time: 07/13/19  1:15 PM  Result Value Ref Range   Color, Urine YELLOW (A) YELLOW   APPearance HAZY (A) CLEAR   Specific Gravity, Urine >1.046 (H) 1.005 - 1.030   pH 7.0 5.0 - 8.0   Glucose, UA NEGATIVE NEGATIVE mg/dL   Hgb urine dipstick MODERATE (A) NEGATIVE   Bilirubin Urine NEGATIVE NEGATIVE   Ketones, ur 80 (A) NEGATIVE mg/dL   Protein, ur >=300 (A) NEGATIVE mg/dL    Nitrite NEGATIVE NEGATIVE   Leukocytes,Ua MODERATE (A) NEGATIVE   RBC / HPF >50 (H) 0 - 5 RBC/hpf   WBC, UA 21-50 0 - 5 WBC/hpf   Bacteria,  UA NONE SEEN NONE SEEN   Squamous Epithelial / LPF 0-5 0 - 5   Mucus PRESENT     Comment: Performed at [facility], [address] [place], [place] [address]  Lactic acid, plasma     Status: None   Collection Time: 07/13/19  3:02 PM  Result Value Ref Range   Lactic Acid, Venous 1.2 0.5 - 1.9 mmol/L    Comment: Performed at [facility], [address] [place], [place] [address]  HIV Antibody (routine testing w rflx)     Status: None   Collection Time: 07/13/19  4:26 PM  Result Value Ref Range   HIV Screen 4th Generation wRfx NON REACTIVE NON REACTIVE    Comment: Performed at [facility], [place] [address] [place], [place] [address]  CULTURE, BLOOD (ROUTINE X 2) w Reflex to ID Panel     Status: None (Preliminary result)   Collection Time: 07/13/19  4:28 PM   Specimen: BLOOD  Result Value Ref Range   Specimen Description BLOOD BLOOD RIGHT HAND    Special Requests      BOTTLES DRAWN AEROBIC AND ANAEROBIC Blood Culture adequate volume   Culture      NO GROWTH < 24 HOURS Performed at [facility], [address] [place], [place] [address]    Report Status PENDING   CULTURE, BLOOD (ROUTINE X 2) w Reflex to ID Panel     Status: None (Preliminary result)   Collection Time: 07/13/19  4:33 PM   Specimen: BLOOD  Result Value Ref Range   Specimen Description BLOOD BLOOD RIGHT FOREARM    Special Requests      BOTTLES DRAWN AEROBIC AND ANAEROBIC Blood Culture adequate volume   Culture      NO GROWTH < 24 HOURS Performed at [facility], [place], [place] [address]    Report Status PENDING   SARS CORONAVIRUS 2 (TAT 6-24 HRS) Nasopharyngeal Nasopharyngeal Swab     Status: None   Collection Time: 07/13/19  4:42 PM   Specimen: Nasopharyngeal Swab  Result Value Ref Range   SARS Coronavirus  2 NEGATIVE NEGATIVE    Comment: (NOTE) SARS-CoV-2 target nucleic acids are NOT DETECTED. The SARS-CoV-2 RNA is generally detectable in upper and lower respiratory specimens during the acute phase of infection. Negative results do not preclude SARS-CoV-2 infection, do not rule out co-infections with other pathogens, and should not be used as the sole basis for treatment or other patient management decisions. Negative results must be combined with clinical observations, patient history, and epidemiological information. The expected result is Negative. Fact Sheet for Patients: SugarRoll.be Fact Sheet for Healthcare Providers: https://www.woods-mathews.com/ This test is not yet approved or cleared by the [place] FDA and  has been authorized for detection and/or diagnosis of SARS-CoV-2 by FDA under an Emergency Use Authorization (EUA). This EUA will remain  in effect (meaning this test can be used) for the duration of the COVID-19 declaration under Section 56 4(b)(1) of the Act, 21 U.S.C. section 360bbb-3(b)(1), unless the authorization is terminated or revoked sooner. Performed at [facility], [place] [address] [place], [place] [address]   Basic metabolic panel     Status: Abnormal   Collection Time: 07/14/19  5:14 AM  Result Value Ref Range   Sodium 141 135 - 145 mmol/L   Potassium 3.1 (L) 3.5 - 5.1 mmol/L   Chloride 108 98 - 111 mmol/L   CO2 25 22 -  32 mmol/L   Glucose, Bld 92 70 - 99 mg/dL    Comment: Glucose reference range applies only to samples taken after fasting for at least 8 hours.   BUN 15 6 - 20 mg/dL   Creatinine, Ser 0.44 (L) 0.61 - 1.24 mg/dL   Calcium 8.3 (L) 8.9 - 10.3 mg/dL   GFR calc non Af Amer >60 >60 mL/min   GFR calc Af Amer >60 >60 mL/min   Anion gap 8 5 - 15    Comment: Performed at [facility], [address] [place], [place] [address]  CBC     Status: Abnormal   Collection Time: 07/14/19   5:14 AM  Result Value Ref Range   WBC 8.7 4.0 - 10.5 K/uL   RBC 4.76 4.22 - 5.81 MIL/uL   Hemoglobin 9.5 (L) 13.0 - 17.0 g/dL   HCT 32.2 (L) 39.0 - 52.0 %   MCV 67.6 (L) 80.0 - 100.0 fL   MCH 20.0 (L) 26.0 - 34.0 pg   MCHC 29.5 (L) 30.0 - 36.0 g/dL   RDW 19.0 (H) 11.5 - 15.5 %   Platelets 365 150 - 400 K/uL   nRBC 0.0 0.0 - 0.2 %    Comment: Performed at [facility], [address], [place], [place] [address]  MRSA PCR Screening     Status: None   Collection Time: 07/14/19 10:18 AM   Specimen: Nasopharyngeal  Result Value Ref Range   MRSA by PCR NEGATIVE NEGATIVE    Comment:        The GeneXpert MRSA Assay (FDA approved for NASAL specimens only), is one component of a comprehensive MRSA colonization surveillance program. It is not intended to diagnose MRSA infection nor to guide or monitor treatment for MRSA infections. Performed at [facility], [address] [place], [place] [address]    No components found for: ESR, C REACTIVE PROTEIN MICRO: Recent Results (from the past 720 hour(s))  CULTURE, BLOOD (ROUTINE X 2) w Reflex to ID Panel     Status: None (Preliminary result)   Collection Time: 07/13/19  4:28 PM   Specimen: BLOOD  Result Value Ref Range Status   Specimen Description BLOOD BLOOD RIGHT HAND  Final   Special Requests   Final    BOTTLES DRAWN AEROBIC AND ANAEROBIC Blood Culture adequate volume   Culture   Final    NO GROWTH < 24 HOURS Performed at [facility], [address] [place], [place] [address]    Report Status PENDING  Incomplete  CULTURE, BLOOD (ROUTINE X 2) w Reflex to ID Panel     Status: None (Preliminary result)   Collection Time: 07/13/19  4:33 PM   Specimen: BLOOD  Result Value Ref Range Status   Specimen Description BLOOD BLOOD RIGHT FOREARM  Final   Special Requests   Final    BOTTLES DRAWN AEROBIC AND ANAEROBIC Blood Culture adequate volume   Culture   Final    NO GROWTH < 24 HOURS Performed at  [facility], [place], [place] [address]    Report Status PENDING  Incomplete  SARS CORONAVIRUS 2 (TAT 6-24 HRS) Nasopharyngeal Nasopharyngeal Swab     Status: None   Collection Time: 07/13/19  4:42 PM   Specimen: Nasopharyngeal Swab  Result Value Ref Range Status   SARS Coronavirus 2 NEGATIVE NEGATIVE Final    Comment: (NOTE) SARS-CoV-2 target nucleic acids are NOT DETECTED. The SARS-CoV-2 RNA is generally detectable in upper  and lower respiratory specimens during the acute phase of infection. Negative results do not preclude SARS-CoV-2 infection, do not rule out co-infections with other pathogens, and should not be used as the sole basis for treatment or other patient management decisions. Negative results must be combined with clinical observations, patient history, and epidemiological information. The expected result is Negative. Fact Sheet for Patients: SugarRoll.be Fact Sheet for Healthcare Providers: https://www.woods-mathews.com/ This test is not yet approved or cleared by the [place] FDA and  has been authorized for detection and/or diagnosis of SARS-CoV-2 by FDA under an Emergency Use Authorization (EUA). This EUA will remain  in effect (meaning this test can be used) for the duration of the COVID-19 declaration under Section 56 4(b)(1) of the Act, 21 U.S.C. section 360bbb-3(b)(1), unless the authorization is terminated or revoked sooner. Performed at [facility], [place] [address], [place], [place] [address]   MRSA PCR Screening     Status: None   Collection Time: 07/14/19 10:18 AM   Specimen: Nasopharyngeal  Result Value Ref Range Status   MRSA by PCR NEGATIVE NEGATIVE Final    Comment:        The GeneXpert MRSA Assay (FDA approved for NASAL specimens only), is one component of a comprehensive MRSA colonization surveillance program. It is not intended to diagnose MRSA infection nor to  guide or monitor treatment for MRSA infections. Performed at [facility], [address] [place], [place] [address]     IMAGING: CT ABDOMEN PELVIS W CONTRAST  Result Date: 07/13/2019 CLINICAL DATA:  Sacral decubitus ulceration with concern for osteomyelitis. Abdominal pain with nausea and vomiting EXAM: CT ABDOMEN AND PELVIS WITH CONTRAST TECHNIQUE: Multidetector CT imaging of the abdomen and pelvis was performed using the standard protocol following bolus administration of intravenous contrast. CONTRAST:  63m OMNIPAQUE IOHEXOL 300 MG/ML  SOLN COMPARISON:  May 10, 2019 FINDINGS: Lower chest: Lung bases are clear. Hepatobiliary: Subcentimeter cysts are again noted in the liver. No new liver lesions are evident. Gallbladder wall is not appreciably thickened. There is no biliary duct dilatation. Pancreas: There is no pancreatic mass or inflammatory focus. Spleen: No splenic lesions are evident. Adrenals/Urinary Tract: Adrenals bilaterally appear unremarkable. There is a cyst in the anterior mid left kidney measuring 1.4 x 1.1 cm. There is a cyst in the lateral mid left kidney measuring 1.2 x 1.2 cm. There is a 5 mm cyst in the mid left kidney. There is no evident hydronephrosis on either side. There is no evident renal or ureteral calculus on either side. The urinary bladder contains air with wall thickening. A Foley catheter is present which does not extend beyond the level of the prostatic urethra. Foley balloon is noted at the prostatic urethra level. Stomach/Bowel: The rectum is distended with stool. Rectal wall does not appear significantly thickened, and there is no appreciable perirectal soft tissue stranding. Elsewhere there is moderate stool in the colon. There is no appreciable bowel dilatation or bowel wall thickening. No evident bowel obstruction. The terminal ileum appears normal. There is no pneumoperitoneum or portal venous air. Vascular/Lymphatic: No abdominal aortic aneurysm.  There is aortic and common iliac artery atherosclerosis. There is extensive atherosclerosis at the origins of each common iliac artery. Major venous structures appear patent. There are multiple lymph nodes in the obturator node chain region which do not appear enlarged and likely have reactive etiology given other changes in the pelvis and perineal regions. These changes are stable. There are also borderline prominent inguinal  lymph nodes, stable and likely of reactive etiology. Reproductive: The prostate and seminal vesicles appear normal in size and contour. No evident pelvic mass. Other: There is again noted air tracking at the base of the Foley catheter in the region of the prostatic urethra, a stable finding. This area has not progressed. There is marked soft tissue thickening with decubitus ulceration in the pelvis with stable peroneal thickening. Soft tissue air is also noted in the right perineum-thigh junction with soft tissue thickening in this area consistent with chronic changes from decubitus ulceration. There is no intraperitoneal or retroperitoneal abscess or ascites. No appendiceal region inflammation evident. Musculoskeletal: Sclerosis due to chronic osteomyelitis is noted in each medial ischium extending to the pubic symphysis. Erosion at each ischial tuberosity is noted with sclerosis in this region on the right, consistent with chronic osteomyelitis. There is sclerosis consistent with chronic osteomyelitis involving the coccyx distally. 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 Sclerosis in the inferior left iliac bone is stable and probably represents residua of chronic osteomyelitis. No new osteomyelitis evident compared to the previous study. No acute fracture or dislocation. No intramuscular lesions evident beyond advanced atrophy throughout the perisacral musculature. There is essentially complete fatty replacement in this region. There  remains soft tissue thickening on the left posteriorly and inferiorly at the base of the penis, likely due to decubitus ulceration and chronic inflammation. Appearance stable. There is postoperative change in the lower thoracic and upper lumbar regions. IMPRESSION: 1. In comparison with the previous study, the Foley catheter no longer extends into the urinary bladder. The balloon is distended at the level of the prostatic urethra. Within the urinary bladder, there is soft tissue air which likely is due to gas-forming organism. There is also wall thickening in the bladder consistent with cystitis. 2.  Rectum distended with stool.  No obvious stercoral proctitis. 3. Chronic osteomyelitis involving the coccyx as well as portions of the inferior left iliac bone and ischia, particularly medially on each side. No acute appearing bony destruction evident. 4. Extraperitoneal air is noted at the base of the penis with soft tissue thickening in this area. These changes are chronic and stable and likely secondary to the chronic decubitus ulceration. Air in the right region of right proximal thigh remains consistent with chronic inflammation. Advanced muscle atrophy is noted throughout these areas, particularly the Peri sacral regions, stable. 5. There is no intraperitoneal or retroperitoneal abscess. No bowel obstruction or periappendiceal region inflammation. 6.  No evident renal or ureteral calculus.  No hydronephrosis. 7. Aortic Atherosclerosis (ICD10-I70.0). Suspected hemodynamically significant obstruction at the origins of the iliac arteries due to calcifications/plaque. Electronically Signed   By: Lowella Grip III M.D.   On: 07/13/2019 15:08    Assessment:   Jonathon York is a 44 y.o. male with paraplegia, chronic sacral wounds.  He has drainage of urine through his wound per his report and CT shows gas in bladder which would support this.  Started on vanco and ceftriaxone and wbc down to 8. Seen by  urology.  Recommendations His wounds are chronic and will be difficult to heal without multidisciplinary approach.  Check ESR and CRP.  Cont vanco and ceftriaxone.  Thank you very much for allowing me to participate in the care of this patient. Please call with questions.   Cheral Marker. Ola Spurr, MD

## 2019-07-14 NOTE — Progress Notes (Signed)
Pt self repositions

## 2019-07-14 NOTE — Consult Note (Signed)
WOC Nurse; reason for consult Multiple pressure injuries; bilateral ischial, sacrum/perineal area; POA  WOC reviewed records, previous images and new CT results.  General surgery has seen this patient and made recommendations for care.  Does not appear patient has diverting ostomy; would recommend consideration for management of chronic non healing wounds  Updated the orders for wound care and need for low air loss mattress for pressure redistribution and moisture management.     Re consult if needed, will not follow at this time. Thanks  Calen Posch [employer], RN,CWOCN, CNS, CWON-AP [phone])

## 2019-07-14 NOTE — Consult Note (Signed)
Urology Consult  I have been asked to see the patient by Dr. Hilton Sinclair, for evaluation and management of urethral erosion/neurogenic bladder.  Chief Complaint: Urine from sacral wound  History of Present Illness: Jonathon York is a 44 y.o. year old male with paraplegia and neurogenic bladder managed with chronic indwelling Foley catheter who was admitted with possible osteomyelitis worsening of sacral wounds.  Notably, the patient was seen and evaluated by Dr. Legrand Rams on 10/2018 for similar complaint.  At that time, he notes that he has urine draining into his sacral decubitus ulcer.  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  This was supported by CT scan findings.  Due to the complexity of this case and need for likely complex urinary diversion, the patient was referred on outpatient setting to [facility].  There is documentation of this referral however it does not look like the appointment was ever made.  The patient reports he never heard from [facility] but also never called them in order to call our office to follow-up.  Today, the situation is essentially unchanged.  He has a chronic indwelling Foley which he changes himself.  CT scan indicated prostatic Foley balloon which was subsequently changed to [address] [place] and is draining well.  He reports that whenever his catheter is not draining appropriately, he has increased amount of urine flow out his sacral wound.  He occasionally will flush his catheter as needed to reposition it to help with this.  This not any worse or better than it was 9 months ago.  CT scan performed just yesterday shows consistent stable bladder wall thickening with air tracking within the prostatic urethra along the Foley with soft tissue thickening at the site of the decubitus ulcers along with stable peritoneal thickening.   Past Medical History:  Diagnosis Date  . Anxiety   .  Decubitus ulcer   . Depression   . Neurogenic bladder   . Osteomyelitis (HCC)   . 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 AMPUTATION Right 03/19/2018   Procedure: 5th Metatarsal Resection;  Surgeon: Recardo Evangelist, DPM;  Location: [facility] ORS;  Service: Podiatry;  Laterality: Right;  . 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 IRRIGATION AND DEBRIDEMENT BUTTOCKS    . TOE AMPUTATION Right     Home Medications:  No outpatient medications have been marked as taking for the 07/13/19 encounter [facility] Encounter).    Allergies:  Allergies  Allergen Reactions  . Orange Fruit [Citrus] Other (See Comments)    Blisters only from ORANGES!!! Patient is not allergic from all citrus    Family History  Problem Relation Age of Onset  . Lymphoma Sister     Social History:  reports that he has quit smoking. His smoking use included cigarettes. He smoked 0.50 packs per day. He has never used smokeless tobacco. He reports current drug use. Drug: Marijuana. He reports that he does not drink alcohol.  ROS: A complete review of systems was performed.  In addition to above, he he complains of nausea vomiting sweating.  All other 10 point review systems is negative.  Physical Exam:  Vital signs in last 24 hours: Temp:  [98 F (36.7 C)-99 F (37.2 C)] 98  F (36.7 C) (03/17 1301) Pulse Rate:  [58-90] 58 (03/17 1301) Resp:  [16-18] 16 (03/17 1301) BP: (92-110)/(54-75) 110/66 (03/17 1301) SpO2:  [97 %-100 %] 100 % (03/17 1301) Weight:  [48.4 kg] 48.4 kg (03/17 1241) Constitutional:  Alert and oriented, No acute distress HEENT: [place] AT, moist mucus membranes.  Trachea midline, no masses Respiratory: Normal respiratory effort,  GI: Abdomen is soft, nontender, nondistended, no abdominal masses GU: Circumcised phallus with 14  French Foley catheter in place draining clear yellow urine.  Normal scrotum.  Urethral meatus is intact.  Patient repositioned in the lateral decubitus position with the left side down.  Dressings were taken down which are relatively clear but stage IV at the deep base.  The catheter was unable to be visualized and there was no obvious drainage purulence or erythema. Skin: No rashes, bruises or suspicious lesions MSK: Lower extremity atrophy appreciated   Laboratory Data:  Recent Labs    07/13/19 1315 07/14/19 0514  WBC 15.2* 8.7  HGB 12.2* 9.5*  HCT 41.9 32.2*   Recent Labs    07/13/19 1315 07/14/19 0514  NA 139 141  K 3.7 3.1*  CL 100 108  CO2 21* 25  GLUCOSE 131* 92  BUN 20 15  CREATININE 0.55* 0.44*  CALCIUM 9.9 8.3*    Radiologic Imaging: CT ABDOMEN PELVIS W CONTRAST  Result Date: 07/13/2019 CLINICAL DATA:  Sacral decubitus ulceration with concern for osteomyelitis. Abdominal pain with nausea and vomiting EXAM: CT ABDOMEN AND PELVIS WITH CONTRAST TECHNIQUE: Multidetector CT imaging of the abdomen and pelvis was performed using the standard protocol following bolus administration of intravenous contrast. CONTRAST:  8mL OMNIPAQUE IOHEXOL 300 MG/ML  SOLN COMPARISON:  May 10, 2019 FINDINGS: Lower chest: Lung bases are clear. Hepatobiliary: Subcentimeter cysts are again noted in the liver. No new liver lesions are evident. Gallbladder wall is not appreciably thickened. There is no biliary duct dilatation. Pancreas: There is no pancreatic mass or inflammatory focus. Spleen: No splenic lesions are evident. Adrenals/Urinary Tract: Adrenals bilaterally appear unremarkable. There is a cyst in the anterior mid left kidney measuring 1.4 x 1.1 cm. There is a cyst in the lateral mid left kidney measuring 1.2 x 1.2 cm. There is a 5 mm cyst in the mid left kidney. There is no evident hydronephrosis on either side. There is no evident renal or ureteral calculus on either side. The urinary  bladder contains air with wall thickening. A Foley catheter is present which does not extend beyond the level of the prostatic urethra. Foley balloon is noted at the prostatic urethra level. Stomach/Bowel: The rectum is distended with stool. Rectal wall does not appear significantly thickened, and there is no appreciable perirectal soft tissue stranding. Elsewhere there is moderate stool in the colon. There is no appreciable bowel dilatation or bowel wall thickening. No evident bowel obstruction. The terminal ileum appears normal. There is no pneumoperitoneum or portal venous air. Vascular/Lymphatic: No abdominal aortic aneurysm. There is aortic and common iliac artery atherosclerosis. There is extensive atherosclerosis at the origins of each common iliac artery. Major venous structures appear patent. There are multiple lymph nodes in the obturator node chain region which do not appear enlarged and likely have reactive etiology given other changes in the pelvis and perineal regions. These changes are stable. There are also borderline prominent inguinal lymph nodes, stable and likely of reactive etiology. Reproductive: The prostate and seminal vesicles appear normal in size and contour. No evident pelvic mass. Other: There is again  noted air tracking at the base of the Foley catheter in the region of the prostatic urethra, a stable finding. This area has not progressed. There is marked soft tissue thickening with decubitus ulceration in the pelvis with stable peroneal thickening. Soft tissue air is also noted in the right perineum-thigh junction with soft tissue thickening in this area consistent with chronic changes from decubitus ulceration. There is no intraperitoneal or retroperitoneal abscess or ascites. No appendiceal region inflammation evident. Musculoskeletal: Sclerosis due to chronic osteomyelitis is noted in each medial ischium extending to the pubic symphysis. Erosion at each ischial tuberosity is noted  with sclerosis in this region on the right, consistent with chronic osteomyelitis. There is sclerosis consistent with chronic osteomyelitis involving the coccyx distally. 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 Sclerosis in the inferior left iliac bone is stable and probably represents residua of chronic osteomyelitis. No new osteomyelitis evident compared to the previous study. No acute fracture or dislocation. No intramuscular lesions evident beyond advanced atrophy throughout the perisacral musculature. There is essentially complete fatty replacement in this region. There remains soft tissue thickening on the left posteriorly and inferiorly at the base of the penis, likely due to decubitus ulceration and chronic inflammation. Appearance stable. There is postoperative change in the lower thoracic and upper lumbar regions. IMPRESSION: 1. In comparison with the previous study, the Foley catheter no longer extends into the urinary bladder. The balloon is distended at the level of the prostatic urethra. Within the urinary bladder, there is soft tissue air which likely is due to gas-forming organism. There is also wall thickening in the bladder consistent with cystitis. 2.  Rectum distended with stool.  No obvious stercoral proctitis. 3. Chronic osteomyelitis involving the coccyx as well as portions of the inferior left iliac bone and ischia, particularly medially on each side. No acute appearing bony destruction evident. 4. Extraperitoneal air is noted at the base of the penis with soft tissue thickening in this area. These changes are chronic and stable and likely secondary to the chronic decubitus ulceration. Air in the right region of right proximal thigh remains consistent with chronic inflammation. Advanced muscle atrophy is noted throughout these areas, particularly the Peri sacral regions, stable. 5. There is no intraperitoneal or retroperitoneal abscess.  No bowel obstruction or periappendiceal region inflammation. 6.  No evident renal or ureteral calculus.  No hydronephrosis. 7. Aortic Atherosclerosis (ICD10-I70.0). Suspected hemodynamically significant obstruction at the origins of the iliac arteries due to calcifications/plaque. Electronically Signed   By: Bretta Bang III M.D.   On: 07/13/2019 15:08    CT scan was personally reviewed.  Agree with radiologic interpretation.  Foley catheter has since been repositioned.  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  As per our previous consultation, options could include replacement of his penile Foley catheter with suprapubic tube although this will likely not be sufficient.  As per previous recommendations, would be best served at a tertiary care center with reconstructive urology for consideration of higher level urinary diversion.  I do see where the referral was previously placed but do not see any communication between the patient and [facility] and the patient did not follow-up  or follow through with this.  We will place another referral.  I have also encouraged him to advocate for himself and if he does not hear from 2, please contact our office to help him facilitate this or [facility] directly.  He was somewhat argumentative and circular today about this issue.  At the end of our conversation, he understands the plan.     07/14/2019, 4:56 PM  Hollice Espy,  MD

## 2019-07-14 NOTE — Progress Notes (Signed)
   07/14/19 1405  Clinical Encounter Type  Visited With Patient  Visit Type Initial;Spiritual support  Referral From Chaplain  Consult/Referral To Chaplain  While doing rounds Chaplain checked in on patient. Patient said he is feeling a little better today than yesterday. Patient mentioned having an infection and being on antibiotics. Chaplain offered pastoral presence, empathy, and prayer.

## 2019-07-14 NOTE — Progress Notes (Signed)
Patient declines air loss mattress at this time.  Advised if he changes his mind we can order for him.

## 2019-07-14 NOTE — Consult Note (Signed)
Date of Consultation:  07/14/2019  Requesting Physician:  Eppie Gibson, MD  Reason for Consultation:  Sacral decubitus wounds  History of Present Illness: Jonathon York is a 44 y.o. male admitted today with sacral decubitus wounds.  Patient reports that he feels the wounds are worsening.  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  He has had sacral wounds for a while now and does his own dressing changes at home.  Reports that he cannot see the wounds well but he does what he can.  There's no one helping with dressing changes.  He will remove the dressings, shower, and apply new ones.  Denies any pain, but has been having nausea/vomiting for two days.  He has indwelling foley catheter that he changes himself.  Past Medical History: Past Medical History:  Diagnosis Date  . Anxiety   . Decubitus ulcer   . Depression   . Neurogenic bladder   . Osteomyelitis ([place])   . 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 AMPUTATION Right 03/19/2018   Procedure: 5th Metatarsal Resection;  Surgeon: Albertine Patricia, DPM;  Location: [facility] ORS;  Service: Podiatry;  Laterality: Right;  . AMPUTATION TOE Right 04/29/2015   Procedure: AMPUTATION TOE;  Surgeon: Sharlotte Alamo, MD;  Location: [facility] ORS;  Service: Podiatry;  Laterality: Right;  . IRRIGATION AND DEBRIDEMENT BUTTOCKS    . TOE AMPUTATION Right     Home Medications: Prior to Admission medications   Not on File    Allergies: Allergies  Allergen Reactions  . Orange Fruit [Citrus] Other (See Comments)    Blisters only from [place]!!! Patient is not allergic from all citrus    Social History:  reports that he has quit smoking. His smoking use included cigarettes. He smoked 0.50 packs per day. He has never used smokeless tobacco. He reports current drug use. Drug: Marijuana. He reports that he does not drink alcohol.   Family History: Family  History  Problem Relation Age of Onset  . Lymphoma Sister     Review of Systems: Review of Systems  Constitutional: Negative for chills and fever.  HENT: Negative for hearing loss.   Respiratory: Negative for shortness of breath.   Cardiovascular: Negative for chest pain.  Gastrointestinal: Positive for nausea and vomiting. Negative for abdominal pain.  Genitourinary: Negative for dysuria.  Musculoskeletal: Negative for myalgias.  Skin: Negative for rash.  Neurological: Negative for dizziness.  Psychiatric/Behavioral: Negative for depression.    Physical Exam BP 110/75 (BP Location: Right Arm)   Pulse 85   Temp 98.8 F (37.1 C) (Oral)   Resp 18   Ht 5\' 9"  (1.753 m)   Wt 49.9 kg   SpO2 99%   BMI 16.24 kg/m  CONSTITUTIONAL: No acute distress HEENT:  Normocephalic, atraumatic, extraocular motion intact. NECK: Trachea is midline, and there is no jugular venous distension. RESPIRATORY:  Lungs are clear, and breath sounds are equal bilaterally. Normal respiratory effort without pathologic use of accessory muscles. CARDIOVASCULAR: Heart is regular without murmurs, gallops, or rubs. GI: The abdomen is soft, non-distended, non-tender. GU: foley catheter in place MUSCULOSKELETAL:  Normal muscle strength and tone in all four extremities.  No peripheral edema or cyanosis. SKIN: The patient has three wounds in the sacral, ischial, and perineal areas.  All have healthy granulation tissue without any purulence or necrosis.  However, during examination, urine came out from the wound near  the base of the scrotum and ischium.  NEUROLOGIC:  Motor and sensation is grossly normal.  Cranial nerves are grossly intact. PSYCH:  Alert and oriented to person, place and time. Affect is normal.  Laboratory Analysis: Results for orders placed or performed during the hospital encounter of 07/13/19 (from the past 24 hour(s))  Lipase, blood     Status: None   Collection Time: 07/13/19  1:15 PM  Result  Value Ref Range   Lipase 20 11 - 51 U/L  Comprehensive metabolic panel     Status: Abnormal   Collection Time: 07/13/19  1:15 PM  Result Value Ref Range   Sodium 139 135 - 145 mmol/L   Potassium 3.7 3.5 - 5.1 mmol/L   Chloride 100 98 - 111 mmol/L   CO2 21 (L) 22 - 32 mmol/L   Glucose, Bld 131 (H) 70 - 99 mg/dL   BUN 20 6 - 20 mg/dL   Creatinine, Ser 2.72 (L) 0.61 - 1.24 mg/dL   Calcium 9.9 8.9 - 53.6 mg/dL   Total Protein 9.1 (H) 6.5 - 8.1 g/dL   Albumin 4.3 3.5 - 5.0 g/dL   AST 15 15 - 41 U/L   ALT 14 0 - 44 U/L   Alkaline Phosphatase 91 38 - 126 U/L   Total Bilirubin 1.2 0.3 - 1.2 mg/dL   GFR calc non Af Amer >60 >60 mL/min   GFR calc Af Amer >60 >60 mL/min   Anion gap 18 (H) 5 - 15  CBC     Status: Abnormal   Collection Time: 07/13/19  1:15 PM  Result Value Ref Range   WBC 15.2 (H) 4.0 - 10.5 K/uL   RBC 6.14 (H) 4.22 - 5.81 MIL/uL   Hemoglobin 12.2 (L) 13.0 - 17.0 g/dL   HCT 64.4 03.4 - 74.2 %   MCV 68.2 (L) 80.0 - 100.0 fL   MCH 19.9 (L) 26.0 - 34.0 pg   MCHC 29.1 (L) 30.0 - 36.0 g/dL   RDW 59.5 (H) 63.8 - 75.6 %   Platelets 268 150 - 400 K/uL   nRBC 0.0 0.0 - 0.2 %  Urinalysis, Complete w Microscopic     Status: Abnormal   Collection Time: 07/13/19  1:15 PM  Result Value Ref Range   Color, Urine YELLOW (A) YELLOW   APPearance HAZY (A) CLEAR   Specific Gravity, Urine >1.046 (H) 1.005 - 1.030   pH 7.0 5.0 - 8.0   Glucose, UA NEGATIVE NEGATIVE mg/dL   Hgb urine dipstick MODERATE (A) NEGATIVE   Bilirubin Urine NEGATIVE NEGATIVE   Ketones, ur 80 (A) NEGATIVE mg/dL   Protein, ur >=433 (A) NEGATIVE mg/dL   Nitrite NEGATIVE NEGATIVE   Leukocytes,Ua MODERATE (A) NEGATIVE   RBC / HPF >50 (H) 0 - 5 RBC/hpf   WBC, UA 21-50 0 - 5 WBC/hpf   Bacteria, UA NONE SEEN NONE SEEN   Squamous Epithelial / LPF 0-5 0 - 5   Mucus PRESENT   Lactic acid, plasma     Status: None   Collection Time: 07/13/19  3:02 PM  Result Value Ref Range   Lactic Acid, Venous 1.2 0.5 - 1.9 mmol/L   HIV Antibody (routine testing w rflx)     Status: None   Collection Time: 07/13/19  4:26 PM  Result Value Ref Range   HIV Screen 4th Generation wRfx NON REACTIVE NON REACTIVE    Imaging: CT ABDOMEN PELVIS W CONTRAST  Result Date: 07/13/2019 CLINICAL DATA:  Sacral decubitus  ulceration with concern for osteomyelitis. Abdominal pain with nausea and vomiting EXAM: CT ABDOMEN AND PELVIS WITH CONTRAST TECHNIQUE: Multidetector CT imaging of the abdomen and pelvis was performed using the standard protocol following bolus administration of intravenous contrast. CONTRAST:  50mL OMNIPAQUE IOHEXOL 300 MG/ML  SOLN COMPARISON:  May 10, 2019 FINDINGS: Lower chest: Lung bases are clear. Hepatobiliary: Subcentimeter cysts are again noted in the liver. No new liver lesions are evident. Gallbladder wall is not appreciably thickened. There is no biliary duct dilatation. Pancreas: There is no pancreatic mass or inflammatory focus. Spleen: No splenic lesions are evident. Adrenals/Urinary Tract: Adrenals bilaterally appear unremarkable. There is a cyst in the anterior mid left kidney measuring 1.4 x 1.1 cm. There is a cyst in the lateral mid left kidney measuring 1.2 x 1.2 cm. There is a 5 mm cyst in the mid left kidney. There is no evident hydronephrosis on either side. There is no evident renal or ureteral calculus on either side. The urinary bladder contains air with wall thickening. A Foley catheter is present which does not extend beyond the level of the prostatic urethra. Foley balloon is noted at the prostatic urethra level. Stomach/Bowel: The rectum is distended with stool. Rectal wall does not appear significantly thickened, and there is no appreciable perirectal soft tissue stranding. Elsewhere there is moderate stool in the colon. There is no appreciable bowel dilatation or bowel wall thickening. No evident bowel obstruction. The terminal ileum appears normal. There is no pneumoperitoneum or portal venous air.  Vascular/Lymphatic: No abdominal aortic aneurysm. There is aortic and common iliac artery atherosclerosis. There is extensive atherosclerosis at the origins of each common iliac artery. Major venous structures appear patent. There are multiple lymph nodes in the obturator node chain region which do not appear enlarged and likely have reactive etiology given other changes in the pelvis and perineal regions. These changes are stable. There are also borderline prominent inguinal lymph nodes, stable and likely of reactive etiology. Reproductive: The prostate and seminal vesicles appear normal in size and contour. No evident pelvic mass. Other: There is again noted air tracking at the base of the Foley catheter in the region of the prostatic urethra, a stable finding. This area has not progressed. There is marked soft tissue thickening with decubitus ulceration in the pelvis with stable peroneal thickening. Soft tissue air is also noted in the right perineum-thigh junction with soft tissue thickening in this area consistent with chronic changes from decubitus ulceration. There is no intraperitoneal or retroperitoneal abscess or ascites. No appendiceal region inflammation evident. Musculoskeletal: Sclerosis due to chronic osteomyelitis is noted in each medial ischium extending to the pubic symphysis. Erosion at each ischial tuberosity is noted with sclerosis in this region on the right, consistent with chronic osteomyelitis. There is sclerosis consistent with chronic osteomyelitis involving the coccyx distally. 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 Sclerosis in the inferior left iliac bone is stable and probably represents residua of chronic osteomyelitis. No new osteomyelitis evident compared to the previous study. No acute fracture or dislocation. No intramuscular lesions evident beyond advanced atrophy throughout the perisacral musculature. There is essentially  complete fatty replacement in this region. There remains soft tissue thickening on the left posteriorly and inferiorly at the base of the penis, likely due to decubitus ulceration and chronic inflammation. Appearance stable. There is postoperative change in the lower thoracic and upper lumbar regions. IMPRESSION: 1. In comparison with the previous study, the  Foley catheter no longer extends into the urinary bladder. The balloon is distended at the level of the prostatic urethra. Within the urinary bladder, there is soft tissue air which likely is due to gas-forming organism. There is also wall thickening in the bladder consistent with cystitis. 2.  Rectum distended with stool.  No obvious stercoral proctitis. 3. Chronic osteomyelitis involving the coccyx as well as portions of the inferior left iliac bone and ischia, particularly medially on each side. No acute appearing bony destruction evident. 4. Extraperitoneal air is noted at the base of the penis with soft tissue thickening in this area. These changes are chronic and stable and likely secondary to the chronic decubitus ulceration. Air in the right region of right proximal thigh remains consistent with chronic inflammation. Advanced muscle atrophy is noted throughout these areas, particularly the Peri sacral regions, stable. 5. There is no intraperitoneal or retroperitoneal abscess. No bowel obstruction or periappendiceal region inflammation. 6.  No evident renal or ureteral calculus.  No hydronephrosis. 7. Aortic Atherosclerosis (ICD10-I70.0). Suspected hemodynamically significant obstruction at the origins of the iliac arteries due to calcifications/plaque. Electronically Signed   By: Bretta Bang III M.D.   On: 07/13/2019 15:08    Assessment and Plan: This is a 44 y.o. male with sacral and ischial wounds, with what appears to be erosion of the wound onto the urethra.  Despite of the urine extravasation, the wounds, appear to be healthy, without  any purulence, necrosis, erythema.  I think the main concern is the urine in the wound, and he would benefit from Urology consult.  It appears that he was seen by Dr. Richardo Hanks in 10/2018, and referral was made to [facility], but the patient reports that an appointment was never made.    From general surgery standpoint, no procedures are needed, and can continue doing dressing changes as currently doing.  Optimize nutrition and off-loading as able.  Face-to-face time spent with the patient and care providers was 55 minutes, with more than 50% of the time spent counseling, educating, and coordinating care of the patient.     Howie Ill, MD [facility] Pg:  [phone]

## 2019-07-14 NOTE — Consult Note (Signed)
Pharmacy Antibiotic Note  Jonathon York is a 44 y.o. male admitted on 07/13/2019 with an infected sacral decubitus ulcer/cellulitis. The patient paraplegic secondary to falling through a roof in 2001 with worsening of his chronic sacral wounds x 8 years.  He was admitted in late 2018 to [facility] and was on vancomycin during that admission. Data from that admission is being used to guide therapy.   Plan: Adjust vancomycin dose to 750 mg IV Q 12 hrs   Goal AUC 400-550   Expected AUC: 431.2  SCr used: 0.60 (actual SCr 0.44 mcg/mL)  T1/2: 7.2h  Css (calculated): 30.4/10.5 mcg/mL  SCr in am  Levels following 4th dose  Height: 5\' 9"  (175.3 cm) Weight: 110 lb (49.9 kg) IBW/kg (Calculated) : 70.7  Temp (24hrs), Avg:98.7 F (37.1 C), Min:98.3 F (36.8 C), Max:99 F (37.2 C)  Recent Labs  Lab 07/13/19 1315 07/13/19 1502 07/14/19 0514  WBC 15.2*  --  8.7  CREATININE 0.55*  --  0.44*  LATICACIDVEN  --  1.2  --     Estimated Creatinine Clearance: 83.2 mL/min (A) (by C-G formula based on SCr of 0.44 mg/dL (L)).    Antimicrobials this admission: Ceftriaxone 3/16 >> Vancomycin 3/16 >>  Microbiology results: 3/17 WCx: pending 3/16 UCx pending 3/16 BCx pending 3/16 SARS CoV-2: negative  Thank you for allowing pharmacy to be a part of this patient's care.  4/16, PharmD, BCPS Clinical Pharmacist 07/14/2019 8:59 AM

## 2019-07-14 NOTE — Progress Notes (Signed)
Patient ID: [name], male   DOB: 12-25-1975, 44 y.o.   MRN: [id number] [facility] PROGRESS NOTE  JAMARIEN RODKEY PRF:[id number] DOB: Sep 23, 1975 DOA: 07/13/2019 PCP: [facility]  HPI/Subjective: Patient very concerned about infection.  He states that he has urine coming out of his decubitus ulcer.  He states every time he gets an infection he sometimes loses a toe.  Objective: Vitals:   07/14/19 0440 07/14/19 1301  BP: (!) 92/54 110/66  Pulse: 63 (!) 58  Resp: 16 16  Temp: 98.3 F (36.8 C) 98 F (36.7 C)  SpO2: 98% 100%    Intake/Output Summary (Last 24 hours) at 07/14/2019 1344 Last data filed at 07/14/2019 1040 Gross per 24 hour  Intake 1963.05 ml  Output 150 ml  Net 1813.05 ml   Filed Weights   07/13/19 1304 07/14/19 1241  Weight: 49.9 kg 48.4 kg    ROS: Review of Systems  Constitutional: Negative for fever.  Eyes: Negative for blurred vision.  Respiratory: Negative for shortness of breath.   Cardiovascular: Negative for chest pain.  Gastrointestinal: Positive for constipation. Negative for abdominal pain, nausea and vomiting.  Genitourinary: Negative for dysuria.  Musculoskeletal: Positive for back pain. Negative for joint pain.  Neurological: Negative for headaches.   Exam: Physical Exam  HENT:  Nose: No mucosal edema.  Mouth/Throat: No oropharyngeal exudate or posterior oropharyngeal edema.  Eyes: Conjunctivae and lids are normal.  Cardiovascular: S1 normal and S2 normal. Exam reveals no gallop.  No murmur heard. Respiratory: No respiratory distress. He has no wheezes. He has no rhonchi. He has no rales.  GI: Soft. Bowel sounds are normal. There is no abdominal tenderness.  Musculoskeletal:     Right ankle: No swelling.     Left ankle: No swelling.  Lymphadenopathy:    He has no cervical adenopathy.  Neurological: He is alert. No cranial nerve deficit.  Patient unable to move his legs  Skin: Skin is warm.  Psychiatric: He has  a normal mood and affect.        Data Reviewed: Basic Metabolic Panel: Recent Labs  Lab 07/13/19 1315 07/14/19 0514  NA 139 141  K 3.7 3.1*  CL 100 108  CO2 21* 25  GLUCOSE 131* 92  BUN 20 15  CREATININE 0.55* 0.44*  CALCIUM 9.9 8.3*   Liver Function Tests: Recent Labs  Lab 07/13/19 1315  AST 15  ALT 14  ALKPHOS 91  BILITOT 1.2  PROT 9.1*  ALBUMIN 4.3   Recent Labs  Lab 07/13/19 1315  LIPASE 20   CBC: Recent Labs  Lab 07/13/19 1315 07/14/19 0514  WBC 15.2* 8.7  HGB 12.2* 9.5*  HCT 41.9 32.2*  MCV 68.2* 67.6*  PLT 268 365     Recent Results (from the past 240 hour(s))  CULTURE, BLOOD (ROUTINE X 2) w Reflex to ID Panel     Status: None (Preliminary result)   Collection Time: 07/13/19  4:28 PM   Specimen: BLOOD  Result Value Ref Range Status   Specimen Description BLOOD BLOOD RIGHT HAND  Final   Special Requests   Final    BOTTLES DRAWN AEROBIC AND ANAEROBIC Blood Culture adequate volume   Culture   Final    NO GROWTH < 24 HOURS Performed at [facility], [address] [place], [place] [address]    Report Status PENDING  Incomplete  CULTURE, BLOOD (ROUTINE X 2) w Reflex to ID Panel     Status: None (Preliminary  result)   Collection Time: 07/13/19  4:33 PM   Specimen: BLOOD  Result Value Ref Range Status   Specimen Description BLOOD BLOOD RIGHT FOREARM  Final   Special Requests   Final    BOTTLES DRAWN AEROBIC AND ANAEROBIC Blood Culture adequate volume   Culture   Final    NO GROWTH < 24 HOURS Performed at [facility], [address], [place], [place] [address]    Report Status PENDING  Incomplete  SARS CORONAVIRUS 2 (TAT 6-24 HRS) Nasopharyngeal Nasopharyngeal Swab     Status: None   Collection Time: 07/13/19  4:42 PM   Specimen: Nasopharyngeal Swab  Result Value Ref Range Status   SARS Coronavirus 2 NEGATIVE NEGATIVE Final    Comment: (NOTE) SARS-CoV-2 target nucleic acids are NOT DETECTED. The SARS-CoV-2 RNA is  generally detectable in upper and lower respiratory specimens during the acute phase of infection. Negative results do not preclude SARS-CoV-2 infection, do not rule out co-infections with other pathogens, and should not be used as the sole basis for treatment or other patient management decisions. Negative results must be combined with clinical observations, patient history, and epidemiological information. The expected result is Negative. Fact Sheet for Patients: HairSlick.no Fact Sheet for Healthcare Providers: quierodirigir.com This test is not yet approved or cleared by the [place] FDA and  has been authorized for detection and/or diagnosis of SARS-CoV-2 by FDA under an Emergency Use Authorization (EUA). This EUA will remain  in effect (meaning this test can be used) for the duration of the COVID-19 declaration under Section 56 4(b)(1) of the Act, 21 U.S.C. section 360bbb-3(b)(1), unless the authorization is terminated or revoked sooner. Performed at [facility], [address] [place], [place] [address]   MRSA PCR Screening     Status: None   Collection Time: 07/14/19 10:18 AM   Specimen: Nasopharyngeal  Result Value Ref Range Status   MRSA by PCR NEGATIVE NEGATIVE Final    Comment:        The GeneXpert MRSA Assay (FDA approved for NASAL specimens only), is one component of a comprehensive MRSA colonization surveillance program. It is not intended to diagnose MRSA infection nor to guide or monitor treatment for MRSA infections. Performed at [facility], [address] [place], [place] [address]      Studies: CT ABDOMEN PELVIS W CONTRAST  Result Date: 07/13/2019 CLINICAL DATA:  Sacral decubitus ulceration with concern for osteomyelitis. Abdominal pain with nausea and vomiting EXAM: CT ABDOMEN AND PELVIS WITH CONTRAST TECHNIQUE: Multidetector CT imaging of the abdomen and pelvis was  performed using the standard protocol following bolus administration of intravenous contrast. CONTRAST:  63mL OMNIPAQUE IOHEXOL 300 MG/ML  SOLN COMPARISON:  May 10, 2019 FINDINGS: Lower chest: Lung bases are clear. Hepatobiliary: Subcentimeter cysts are again noted in the liver. No new liver lesions are evident. Gallbladder wall is not appreciably thickened. There is no biliary duct dilatation. Pancreas: There is no pancreatic mass or inflammatory focus. Spleen: No splenic lesions are evident. Adrenals/Urinary Tract: Adrenals bilaterally appear unremarkable. There is a cyst in the anterior mid left kidney measuring 1.4 x 1.1 cm. There is a cyst in the lateral mid left kidney measuring 1.2 x 1.2 cm. There is a 5 mm cyst in the mid left kidney. There is no evident hydronephrosis on either side. There is no evident renal or ureteral calculus on either side. The urinary bladder contains air with wall thickening. A Foley catheter is present which does  not extend beyond the level of the prostatic urethra. Foley balloon is noted at the prostatic urethra level. Stomach/Bowel: The rectum is distended with stool. Rectal wall does not appear significantly thickened, and there is no appreciable perirectal soft tissue stranding. Elsewhere there is moderate stool in the colon. There is no appreciable bowel dilatation or bowel wall thickening. No evident bowel obstruction. The terminal ileum appears normal. There is no pneumoperitoneum or portal venous air. Vascular/Lymphatic: No abdominal aortic aneurysm. There is aortic and common iliac artery atherosclerosis. There is extensive atherosclerosis at the origins of each common iliac artery. Major venous structures appear patent. There are multiple lymph nodes in the obturator node chain region which do not appear enlarged and likely have reactive etiology given other changes in the pelvis and perineal regions. These changes are stable. There are also borderline prominent  inguinal lymph nodes, stable and likely of reactive etiology. Reproductive: The prostate and seminal vesicles appear normal in size and contour. No evident pelvic mass. Other: There is again noted air tracking at the base of the Foley catheter in the region of the prostatic urethra, a stable finding. This area has not progressed. There is marked soft tissue thickening with decubitus ulceration in the pelvis with stable peroneal thickening. Soft tissue air is also noted in the right perineum-thigh junction with soft tissue thickening in this area consistent with chronic changes from decubitus ulceration. There is no intraperitoneal or retroperitoneal abscess or ascites. No appendiceal region inflammation evident. Musculoskeletal: Sclerosis due to chronic osteomyelitis is noted in each medial ischium extending to the pubic symphysis. Erosion at each ischial tuberosity is noted with sclerosis in this region on the right, consistent with chronic osteomyelitis. There is sclerosis consistent with chronic osteomyelitis involving the coccyx distally. 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 Sclerosis in the inferior left iliac bone is stable and probably represents residua of chronic osteomyelitis. No new osteomyelitis evident compared to the previous study. No acute fracture or dislocation. No intramuscular lesions evident beyond advanced atrophy throughout the perisacral musculature. There is essentially complete fatty replacement in this region. There remains soft tissue thickening on the left posteriorly and inferiorly at the base of the penis, likely due to decubitus ulceration and chronic inflammation. Appearance stable. There is postoperative change in the lower thoracic and upper lumbar regions. IMPRESSION: 1. In comparison with the previous study, the Foley catheter no longer extends into the urinary bladder. The balloon is distended at the level of the  prostatic urethra. Within the urinary bladder, there is soft tissue air which likely is due to gas-forming organism. There is also wall thickening in the bladder consistent with cystitis. 2.  Rectum distended with stool.  No obvious stercoral proctitis. 3. Chronic osteomyelitis involving the coccyx as well as portions of the inferior left iliac bone and ischia, particularly medially on each side. No acute appearing bony destruction evident. 4. Extraperitoneal air is noted at the base of the penis with soft tissue thickening in this area. These changes are chronic and stable and likely secondary to the chronic decubitus ulceration. Air in the right region of right proximal thigh remains consistent with chronic inflammation. Advanced muscle atrophy is noted throughout these areas, particularly the Peri sacral regions, stable. 5. There is no intraperitoneal or retroperitoneal abscess. No bowel obstruction or periappendiceal region inflammation. 6.  No evident renal or ureteral calculus.  No hydronephrosis. 7. Aortic Atherosclerosis (ICD10-I70.0). Suspected hemodynamically significant obstruction at the  origins of the iliac arteries due to calcifications/plaque. Electronically Signed   By: Bretta Bang III M.D.   On: 07/13/2019 15:08    Scheduled Meds: . vitamin C  500 mg Oral BID  . Chlorhexidine Gluconate Cloth  6 each Topical Daily  . enoxaparin (LOVENOX) injection  30 mg Subcutaneous Q24H  . feeding supplement (ENSURE ENLIVE)  237 mL Oral TID BM  . [START ON 07/15/2019] multivitamin-lutein  1 capsule Oral Daily   Continuous Infusions: . sodium chloride Stopped (07/14/19 1040)  . cefTRIAXone (ROCEPHIN)  IV    . vancomycin      Assessment/Plan:  1. 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  I am not sure what to make of the chronic osteomyelitis because I think he has been treated for this in the past.  I will get infectious disease to see the  patient to get their opinion on how to proceed with antibiotics moving forward.  Patient is on IV Rocephin and vancomycin for now.  Follow-up blood cultures.  We will get a wound culture.  Seen by general surgery and nothing to debride.  Wound care nurse to make their recommendations. 2. Chronic Foley catheter.  This was changed in the emergency room.  Awaiting urine culture.  Patient requested a urology consultation because he has urine coming out of his decubitus ulcer.  They had seen him in the past and recommend he get to a tertiary care center like [facility] for reconstruction surgery. 3. Moderate malnutrition.  Dietary supplements 4. Constipation has Colace and Dulcolax 5. Iron deficiency anemia 6. 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  Code Status:     Code Status Orders  (From admission, onward)         Start     Ordered   07/13/19 1627  Full code  Continuous     07/13/19 1627        Code Status History    Date Active Date Inactive Code Status Order ID Comments User Context   11/26/2018 0411 11/30/2018 2011 Full Code [id number]  Alford Highland, MD ED   04/22/2018 2308 04/23/2018 1622 Full Code [id number]  Oralia Manis, MD Inpatient   03/18/2018 1119 03/23/2018 1659 Full Code [id number]  Houston Siren, MD ED   02/14/2017 2321 02/19/2017 2046 Full Code [id number]  Oralia Manis, MD Inpatient   11/19/2016 1720 11/21/2016 2207 Full Code [id number]  Auburn Bilberry, MD Inpatient   04/15/2016 1802 04/19/2016 0211 Full Code [id number]  Shaune Pollack, MD Inpatient   04/29/2015 1017 04/30/2015 1339 Full Code [id number]  Linus Galas, MD Inpatient   04/28/2015 0438 04/29/2015 1017 Full Code [id number]  Milagros Loll, MD ED   Advance Care Planning Activity     Disposition Plan: Need to await urine culture and infectious disease opinion prior to disposition.  Likely will need a tertiary care center urologist at some point but will need to treat infection first.  Consultants:  General  surgery  Infectious disease  Urology  Antibiotics:  Rocephin  Vancomycin  Time spent: 28 minutes  [employer]

## 2019-07-15 LAB — URINE CULTURE

## 2019-07-15 LAB — CREATININE, SERUM
Creatinine, Ser: 0.41 mg/dL — ABNORMAL LOW (ref 0.61–1.24)
GFR calc Af Amer: >60 mL/min (ref >60)
GFR calc non Af Amer: >60 mL/min (ref >60)

## 2019-07-15 LAB — SEDIMENTATION RATE: Sed Rate: 37 mm/hr — ABNORMAL HIGH (ref 0–15)

## 2019-07-15 LAB — C-REACTIVE PROTEIN: CRP: 6.8 mg/dL — ABNORMAL HIGH (ref <1.0)

## 2019-07-15 MED ORDER — LACTULOSE 10 GM/15ML PO SOLN: 20.0000 g | Freq: Every day | ORAL | Status: DC

## 2019-07-15 MED ORDER — ENOXAPARIN SODIUM 40 MG/0.4ML ~~LOC~~ SOLN
40.0000 mg | SUBCUTANEOUS | Status: DC
  Administered 2019-07-15: 40 mg via SUBCUTANEOUS
  Filled 2019-07-15: qty 0.4

## 2019-07-15 MED ORDER — LACTULOSE 10 GM/15ML PO SOLN: 30.0000 g | Freq: Every day | ORAL | Status: DC | PRN

## 2019-07-15 NOTE — Progress Notes (Signed)
Patient ID: Jonathon York, male   DOB: 09-Nov-1975, 44 y.o.   MRN: [id number] [facility] PROGRESS NOTE  Jonathon LeedsRichard L Renfrow WGN:[id number] DOB: 09-Nov-1975 DOA: 07/13/2019 PCP: [place], [facility]  HPI/Subjective: Patient seen this morning does not feel well at all.  Having nausea and dry heaves but no vomiting.  No abdominal pain.  Always has back pain peer  Objective: Vitals:   07/15/19 0603 07/15/19 1230  BP: 103/61 110/64  Pulse: 65 73  Resp: 18 18  Temp: 97.6 F (36.4 C) 98.5 F (36.9 C)  SpO2: 100% 100%    Intake/Output Summary (Last 24 hours) at 07/15/2019 1426 Last data filed at 07/15/2019 0300 Gross per 24 hour  Intake 1179.25 ml  Output 400 ml  Net 779.25 ml   Filed Weights   07/13/19 1304 07/14/19 1241  Weight: 49.9 kg 48.4 kg    ROS: Review of Systems  Constitutional: Negative for fever.  Eyes: Negative for blurred vision.  Respiratory: Negative for shortness of breath.   Cardiovascular: Negative for chest pain.  Gastrointestinal: Positive for nausea. Negative for abdominal pain and vomiting.  Genitourinary: Negative for dysuria.  Musculoskeletal: Positive for back pain. Negative for joint pain.  Neurological: Negative for headaches.   Exam: Physical Exam  HENT:  Nose: No mucosal edema.  Mouth/Throat: No oropharyngeal exudate or posterior oropharyngeal edema.  Eyes: Conjunctivae and lids are normal.  Cardiovascular: S1 normal and S2 normal. Exam reveals no gallop.  No murmur heard. Respiratory: No respiratory distress. He has no wheezes. He has no rhonchi. He has no rales.  GI: Soft. Bowel sounds are normal. There is no abdominal tenderness.  Musculoskeletal:     Right ankle: No swelling.     Left ankle: No swelling.  Lymphadenopathy:    He has no cervical adenopathy.  Neurological: He is alert. No cranial nerve deficit.  Patient unable to move his legs  Skin: Skin is warm.  Psychiatric: He has a normal mood and affect.        Data  Reviewed: Basic Metabolic Panel: Recent Labs  Lab 07/13/19 1315 07/14/19 0514 07/15/19 0611  NA 139 141  --   K 3.7 3.1*  --   CL 100 108  --   CO2 21* 25  --   GLUCOSE 131* 92  --   BUN 20 15  --   CREATININE 0.55* 0.44* 0.41*  CALCIUM 9.9 8.3*  --    Liver Function Tests: Recent Labs  Lab 07/13/19 1315  AST 15  ALT 14  ALKPHOS 91  BILITOT 1.2  PROT 9.1*  ALBUMIN 4.3   Recent Labs  Lab 07/13/19 1315  LIPASE 20   CBC: Recent Labs  Lab 07/13/19 1315 07/14/19 0514  WBC 15.2* 8.7  HGB 12.2* 9.5*  HCT 41.9 32.2*  MCV 68.2* 67.6*  PLT 268 365     Recent Results (from the past 240 hour(s))  CULTURE, BLOOD (ROUTINE X 2) w Reflex to ID Panel     Status: None (Preliminary result)   Collection Time: 07/13/19  4:28 PM   Specimen: BLOOD  Result Value Ref Range Status   Specimen Description BLOOD BLOOD RIGHT HAND  Final   Special Requests   Final    BOTTLES DRAWN AEROBIC AND ANAEROBIC Blood Culture adequate volume   Culture   Final    NO GROWTH 2 DAYS Performed at [facility], [address] [place], [place] [address]    Report Status PENDING  Incomplete  CULTURE, BLOOD (ROUTINE X 2) w Reflex to ID Panel     Status: None (Preliminary result)   Collection Time: 07/13/19  4:33 PM   Specimen: BLOOD  Result Value Ref Range Status   Specimen Description BLOOD BLOOD RIGHT FOREARM  Final   Special Requests   Final    BOTTLES DRAWN AEROBIC AND ANAEROBIC Blood Culture adequate volume   Culture   Final    NO GROWTH 2 DAYS Performed at [facility], [address] [place], [place] [address]    Report Status PENDING  Incomplete  SARS CORONAVIRUS 2 (TAT 6-24 HRS) Nasopharyngeal Nasopharyngeal Swab     Status: None   Collection Time: 07/13/19  4:42 PM   Specimen: Nasopharyngeal Swab  Result Value Ref Range Status   SARS Coronavirus 2 NEGATIVE NEGATIVE Final    Comment: (NOTE) SARS-CoV-2 target nucleic acids are NOT DETECTED. The SARS-CoV-2 RNA  is generally detectable in upper and lower respiratory specimens during the acute phase of infection. Negative results do not preclude SARS-CoV-2 infection, do not rule out co-infections with other pathogens, and should not be used as the sole basis for treatment or other patient management decisions. Negative results must be combined with clinical observations, patient history, and epidemiological information. The expected result is Negative. Fact Sheet for Patients: HairSlick.no Fact Sheet for Healthcare Providers: quierodirigir.com This test is not yet approved or cleared by the [place] FDA and  has been authorized for detection and/or diagnosis of SARS-CoV-2 by FDA under an Emergency Use Authorization (EUA). This EUA will remain  in effect (meaning this test can be used) for the duration of the COVID-19 declaration under Section 56 4(b)(1) of the Act, 21 U.S.C. section 360bbb-3(b)(1), unless the authorization is terminated or revoked sooner. Performed at [facility], [address] [place], [place] [address]   Urine culture     Status: Abnormal   Collection Time: 07/13/19  6:08 PM   Specimen: Urine, Random  Result Value Ref Range Status   Specimen Description   Final    URINE, RANDOM Performed at [facility], [address] [place], [place] [address]    Special Requests   Final    NONE Performed at [facility], [address] [place], [place] [address]    Culture MULTIPLE SPECIES PRESENT, SUGGEST RECOLLECTION (A)  Final   Report Status 07/15/2019 FINAL  Final  Aerobic/Anaerobic Culture (surgical/deep wound)     Status: None (Preliminary result)   Collection Time: 07/14/19 10:18 AM   Specimen: Wound  Result Value Ref Range Status   Specimen Description   Final    WOUND Performed at [facility], [address] [place], [place] [address]    Special Requests   Final     SACRAL Performed at [facility], [address] [place], [place] [address]    Gram Stain NO WBC SEEN NO ORGANISMS SEEN   Final   Culture   Final    RARE GROUP B STREP(S.AGALACTIAE)ISOLATED TESTING AGAINST S. AGALACTIAE NOT ROUTINELY PERFORMED DUE TO PREDICTABILITY OF AMP/PEN/VAN SUSCEPTIBILITY. Performed at [facility], [address] [place], [place] [address]    Report Status PENDING  Incomplete  MRSA PCR Screening     Status: None   Collection Time: 07/14/19 10:18 AM   Specimen: Nasopharyngeal  Result Value Ref Range Status   MRSA by PCR NEGATIVE NEGATIVE Final    Comment:        The GeneXpert  MRSA Assay (FDA approved for NASAL specimens only), is one component of a comprehensive MRSA colonization surveillance program. It is not intended to diagnose MRSA infection nor to guide or monitor treatment for MRSA infections. Performed at [facility], [address] [place], [place] [address]      Studies: CT ABDOMEN PELVIS W CONTRAST  Result Date: 07/13/2019 CLINICAL DATA:  Sacral decubitus ulceration with concern for osteomyelitis. Abdominal pain with nausea and vomiting EXAM: CT ABDOMEN AND PELVIS WITH CONTRAST TECHNIQUE: Multidetector CT imaging of the abdomen and pelvis was performed using the standard protocol following bolus administration of intravenous contrast. CONTRAST:  68mL OMNIPAQUE IOHEXOL 300 MG/ML  SOLN COMPARISON:  May 10, 2019 FINDINGS: Lower chest: Lung bases are clear. Hepatobiliary: Subcentimeter cysts are again noted in the liver. No new liver lesions are evident. Gallbladder wall is not appreciably thickened. There is no biliary duct dilatation. Pancreas: There is no pancreatic mass or inflammatory focus. Spleen: No splenic lesions are evident. Adrenals/Urinary Tract: Adrenals bilaterally appear unremarkable. There is a cyst in the anterior mid left kidney measuring 1.4 x 1.1 cm. There is a cyst in the lateral mid left kidney  measuring 1.2 x 1.2 cm. There is a 5 mm cyst in the mid left kidney. There is no evident hydronephrosis on either side. There is no evident renal or ureteral calculus on either side. The urinary bladder contains air with wall thickening. A Foley catheter is present which does not extend beyond the level of the prostatic urethra. Foley balloon is noted at the prostatic urethra level. Stomach/Bowel: The rectum is distended with stool. Rectal wall does not appear significantly thickened, and there is no appreciable perirectal soft tissue stranding. Elsewhere there is moderate stool in the colon. There is no appreciable bowel dilatation or bowel wall thickening. No evident bowel obstruction. The terminal ileum appears normal. There is no pneumoperitoneum or portal venous air. Vascular/Lymphatic: No abdominal aortic aneurysm. There is aortic and common iliac artery atherosclerosis. There is extensive atherosclerosis at the origins of each common iliac artery. Major venous structures appear patent. There are multiple lymph nodes in the obturator node chain region which do not appear enlarged and likely have reactive etiology given other changes in the pelvis and perineal regions. These changes are stable. There are also borderline prominent inguinal lymph nodes, stable and likely of reactive etiology. Reproductive: The prostate and seminal vesicles appear normal in size and contour. No evident pelvic mass. Other: There is again noted air tracking at the base of the Foley catheter in the region of the prostatic urethra, a stable finding. This area has not progressed. There is marked soft tissue thickening with decubitus ulceration in the pelvis with stable peroneal thickening. Soft tissue air is also noted in the right perineum-thigh junction with soft tissue thickening in this area consistent with chronic changes from decubitus ulceration. There is no intraperitoneal or retroperitoneal abscess or ascites. No appendiceal  region inflammation evident. Musculoskeletal: Sclerosis due to chronic osteomyelitis is noted in each medial ischium extending to the pubic symphysis. Erosion at each ischial tuberosity is noted with sclerosis in this region on the right, consistent with chronic osteomyelitis. There is sclerosis consistent with chronic osteomyelitis involving the coccyx distally. 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 Sclerosis in the inferior left iliac bone is stable and probably represents residua of chronic osteomyelitis. No new osteomyelitis evident compared to the previous study. No acute fracture or dislocation. No  intramuscular lesions evident beyond advanced atrophy throughout the perisacral musculature. There is essentially complete fatty replacement in this region. There remains soft tissue thickening on the left posteriorly and inferiorly at the base of the penis, likely due to decubitus ulceration and chronic inflammation. Appearance stable. There is postoperative change in the lower thoracic and upper lumbar regions. IMPRESSION: 1. In comparison with the previous study, the Foley catheter no longer extends into the urinary bladder. The balloon is distended at the level of the prostatic urethra. Within the urinary bladder, there is soft tissue air which likely is due to gas-forming organism. There is also wall thickening in the bladder consistent with cystitis. 2.  Rectum distended with stool.  No obvious stercoral proctitis. 3. Chronic osteomyelitis involving the coccyx as well as portions of the inferior left iliac bone and ischia, particularly medially on each side. No acute appearing bony destruction evident. 4. Extraperitoneal air is noted at the base of the penis with soft tissue thickening in this area. These changes are chronic and stable and likely secondary to the chronic decubitus ulceration. Air in the right region of right proximal thigh remains  consistent with chronic inflammation. Advanced muscle atrophy is noted throughout these areas, particularly the Peri sacral regions, stable. 5. There is no intraperitoneal or retroperitoneal abscess. No bowel obstruction or periappendiceal region inflammation. 6.  No evident renal or ureteral calculus.  No hydronephrosis. 7. Aortic Atherosclerosis (ICD10-I70.0). Suspected hemodynamically significant obstruction at the origins of the iliac arteries due to calcifications/plaque. Electronically Signed   By: Bretta Bang III M.D.   On: 07/13/2019 15:08    Scheduled Meds: . vitamin C  500 mg Oral BID  . Chlorhexidine Gluconate Cloth  6 each Topical Daily  . enoxaparin (LOVENOX) injection  40 mg Subcutaneous Q24H  . feeding supplement (ENSURE ENLIVE)  237 mL Oral TID BM  . metroNIDAZOLE  500 mg Oral Q8H  . multivitamin-lutein  1 capsule Oral Daily   Continuous Infusions: . sodium chloride 75 mL/hr at 07/15/19 0300  . cefTRIAXone (ROCEPHIN)  IV 1 g (07/15/19 1333)  . vancomycin 750 mg (07/15/19 0850)    Assessment/Plan:  1. 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  Appreciate infectious disease consultation.  The patient is on IV Rocephin and vancomycin for now.  Follow-up blood cultures and wound culture.  Seen by general surgery and nothing to debride. 2. Chronic Foley catheter.  This was changed in the emergency room.  Urine culture showing multiple species.  Urology recommends following up at the tertiary care center for urology evaluation in the future. 3. Moderate malnutrition.  Dietary supplements 4. Constipation has Colace and Dulcolax.  We will give a dose of lactulose today. 5. Iron deficiency anemia 6. 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  Code Status:     Code Status Orders  (From admission, onward)         Start     Ordered   07/13/19 1627  Full code  Continuous     07/13/19 1627        Code  Status History    Date Active Date Inactive Code Status Order ID Comments User Context   11/26/2018 0411 11/30/2018 2011 Full Code [id number]  Alford Highland, MD ED   04/22/2018 2308 04/23/2018 1622 Full Code [id number]  Oralia Manis, MD Inpatient   03/18/2018 1119 03/23/2018 1659 Full Code [id number]  Houston Siren, MD ED   02/14/2017 2321  02/19/2017 2046 Full Code [id number]  Lance Coon, MD Inpatient   11/19/2016 1720 11/21/2016 2207 Full Code [id number]  Dustin Flock, MD Inpatient   04/15/2016 1802 04/19/2016 0211 Full Code [id number]  Demetrios Loll, MD Inpatient   04/29/2015 1017 04/30/2015 1339 Full Code [id number]  Sharlotte Alamo, MD Inpatient   04/28/2015 0438 04/29/2015 1017 Full Code [id number]  Hillary Bow, MD ED   Advance Care Planning Activity     Disposition Plan: Patient feeling nauseous today.  We will try to get a bowel movement going.  Continue IV antibiotics while here.  Hopefully be able to switch over to oral antibiotics upon discharge home.  Evaluate on a daily basis at this point on when to go home.  Consultants:  General surgery  Infectious disease  Urology  Antibiotics:  Rocephin  Vancomycin  Time spent: 28 minutes  [place]

## 2019-07-15 NOTE — Telephone Encounter (Signed)
I called [facility] and they could not find a record of the other referral and the one Richardo Hanks put it auto closed. I have sent it again and I will hold it out and call them back to make sure that they got it.    Jonathon York

## 2019-07-16 LAB — CREATININE, SERUM
Creatinine, Ser: 0.48 mg/dL — ABNORMAL LOW (ref 0.61–1.24)
GFR calc Af Amer: >60 mL/min (ref >60)
GFR calc non Af Amer: >60 mL/min (ref >60)

## 2019-07-16 MED ORDER — TRAMADOL HCL 50 MG PO TABS: 50.0000 mg | ORAL_TABLET | Freq: Four times a day (QID) | ORAL | 0 refills | Status: DC | PRN

## 2019-07-16 MED ORDER — AMOXICILLIN-POT CLAVULANATE 875-125 MG PO TABS: 1.0000 | ORAL_TABLET | Freq: Two times a day (BID) | ORAL | 0 refills | Status: AC

## 2019-07-16 MED ORDER — DOCUSATE SODIUM 100 MG PO CAPS: 100.0000 mg | ORAL_CAPSULE | Freq: Two times a day (BID) | ORAL | 0 refills | Status: DC | PRN

## 2019-07-16 MED ORDER — ENSURE ENLIVE PO LIQD: 237.0000 mL | Freq: Three times a day (TID) | ORAL | 12 refills | Status: DC

## 2019-07-16 MED ORDER — ASCORBIC ACID 500 MG PO TABS: 500.0000 mg | ORAL_TABLET | Freq: Two times a day (BID) | ORAL | 0 refills | Status: DC

## 2019-07-16 MED ORDER — ONDANSETRON HCL 4 MG PO TABS: 4.0000 mg | ORAL_TABLET | Freq: Four times a day (QID) | ORAL | 0 refills | Status: DC | PRN

## 2019-07-16 NOTE — Discharge Instructions (Signed)
Preventing Pressure Injuries  A pressure injury, sometimes called a bedsore or a pressure ulcer, is an injury to the skin and underlying tissue caused by pressure. A pressure injury can happen when your skin presses against a surface, such as a mattress or wheelchair seat, for too long. The pressure on the blood vessels causes reduced blood flow to your skin. This can eventually cause the skin tissue to die and break down into a wound. Pressure injuries usually develop:  Over bony parts of the body, such as the tailbone, shoulders, elbows, hips, and heels.  Under medical devices, such as respiratory equipment, stockings, tubes, and splints. How can this condition affect me? Pressure injuries are caused by a lack of blood supply to an area of skin. These injuries begin as a reddened area on the skin and can become an open sore. They can result from intense pressure over a short period of time or from less pressure over a long period of time. Pressure injuries can vary in severity. They can cause pain, muscle damage, and infection. What can increase my risk? This condition is more likely to develop in people who:  Are in the hospital or an extended care facility.  Are bedridden or in a wheelchair.  Have an injury or disease that keeps them from: ? Moving normally. ? Feeling pain or pressure. ? Communicating if they feel pain or pressure.  Have a condition that: ? Makes them sleepy or less alert. ? Causes poor blood flow.  Need to wear a medical device.  Have poor control of their bladder or bowel functions (incontinence).  Have poor nutrition (malnutrition).  Have had this condition before.  Are of certain ethnicities. People of African American, Latino, or Hispanic descent are at higher risk compared to other ethnic groups. What actions can I take to prevent pressure injuries? Reducing and redistributing pressure  Do not lie or sit in one position for a long time. Move or change  position: ? Every hour when out of bed in a chair. ? Every two hours when in bed. ? As often as told by your health care provider.  Use pillows, wedges, or cushions to redistribute pressure. Ask your health care provider to recommend a mattress, cushions, or pads for you.  Use medical devices that do not rub your skin. Tell your health care provider if one of your medical devices is causing pain or irritation. Skin care If you are in the hospital, your health care providers:  Will inspect your skin, including areas under or around medical devices, at least twice a day.  May recommend that you use certain types of bedding to help prevent pressure injuries. These may include a pad, mattress, or chair cushion that is filled with gel, air, water, or foam.  Will evaluate your nutrition and consult a dietitian if needed.  Will inspect and change any wound dressings regularly.  May help you move into different positions every few hours.  Will adjust any medical devices and braces as needed to limit pressure on your skin.  Will keep your skin clean and dry.  May use gentle cleansers and skin protectants if you are incontinent.  Will moisturize any dry skin. In general, at home:  Keep your skin clean and dry. Gently pat your skin dry.  Do not rub or massage bony areas of your skin.  Moisturize dry skin.  Use gentle cleansers and skin protectants routinely if you are incontinent.  Check your skin at least once   a day for any changes in color and for any new blisters or sores. Make sure to check under and around any medical devices and between skin folds. Have a caregiver do this for you if you are not able.  Lifestyle  Be as active as you can every day. Ask your health care provider to suggest safe exercises or activities.  Do not abuse drugs or alcohol.  Do not use any products that contain nicotine or tobacco, such as cigarettes, e-cigarettes, and chewing tobacco. If you need  help quitting, ask your health care provider. General instructions   Take over-the-counter and prescription medicines only as told by your health care provider.  Work with your health care provider to manage any chronic health conditions.  Eat a healthy diet that includes protein, vitamins, and minerals. Ask your health care provider what types of food you should eat.  Drink enough fluid to keep your urine pale yellow.  Keep all follow-up visits as told by your health care provider. This is important. Contact a health care provider if you:  Feel or see any changes in your skin. Summary  A pressure injury, sometimes called a bedsore or a pressure ulcer, is an injury to the skin and underlying tissue caused by pressure.  Do not lie or sit in one position for a long time.  Check your skin at least once a day for any changes in color and for any new blisters or sores.  Make sure to check under and around any medical devices and between skin folds. Have a caregiver do this for you if you are not able.  Eat a healthy diet that includes protein, vitamins, and minerals. Ask your health care provider what types of food you should eat. This information is not intended to replace advice given to you by your health care provider. Make sure you discuss any questions you have with your health care provider. Document Revised: 08/07/2018 Document Reviewed: 01/06/2018 Elsevier Patient Education  2020 [employer]  

## 2019-07-16 NOTE — Discharge Summary (Signed)
[facility] [place] at [facility]   PATIENT NAME: Jonathon York    MR#:  [id number]  DATE OF BIRTH:  [birth year]  DATE OF ADMISSION:  07/13/2019 ADMITTING PHYSICIAN: Charise Killian, MD  DATE OF DISCHARGE: 07/16/2019  2:58 PM  PRIMARY CARE PHYSICIAN: Dr Sampson Goon   ADMISSION DIAGNOSIS:  Sacral decubitus ulcer [L89.159] Sacral decubitus ulcer, stage IV (HCC) [L89.154]  DISCHARGE DIAGNOSIS:  Active Problems:   Moderate malnutrition (HCC)   Sacral decubitus ulcer, stage IV (HCC)   Chronic osteomyelitis (HCC)   Constipation   Iron deficiency anemia   SECONDARY DIAGNOSIS:   Past Medical History:  Diagnosis Date  . Anxiety   . Decubitus ulcer   . Depression   . Neurogenic bladder   . Osteomyelitis (HCC)   . 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  Stage IV sacral decubitus and buttock decubitus.  Patient has 3 large ulcerations.  Patient also has chronic osteomyelitis.  Appreciate infectious disease consultation.  The patient has been treated for osteomyelitis in the past and no need to treat with IV antibiotics again upon discharge.  The patient was given IV Rocephin and vancomycin here.  Wound culture growing strep agalactiae.  Switch antibiotics over to Augmentin upon discharge.  Seen also by general surgery and they did not see anything that needed debridement.  Home health for dressing changes at home.  Patient was seen in consultation by urology and they recommended following up at [facility] for reconstructive urological procedure.  Patient will likely also need plastics at tertiary care center in coordination of care.  Hospital bed also prescribed. 2.  Chronic Foley catheter.  Patient had his Foley catheter changed in the emergency room.  We will set up home health to change every month.  Patient has urine leaking through one of the decubiti. 3.  Moderate malnutrition.  Dietary supplements 4.  Constipation prescribed  Colace. 5.  Iron deficiency anemia 6.  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  DISCHARGE CONDITIONS:  Satisfactory  CONSULTS OBTAINED:  Treatment Team:  Mick Sell, MD Vanna Scotland, MD  DRUG ALLERGIES:   Allergies  Allergen Reactions  . Orange Fruit [Citrus] Other (See Comments)    Blisters only from ORANGES!!! Patient is not allergic from all citrus    DISCHARGE MEDICATIONS:   Allergies as of 07/16/2019      Reactions   Orange Fruit [citrus] Other (See Comments)   Blisters only from ORANGES!!! Patient is not allergic from all citrus      Medication List    TAKE these medications   amoxicillin-clavulanate 875-125 MG tablet Commonly known as: Augmentin Take 1 tablet by mouth 2 (two) times daily for 10 days.   ascorbic acid 500 MG tablet Commonly known as: VITAMIN C Take 1 tablet (500 mg total) by mouth 2 (two) times daily.   docusate sodium 100 MG capsule Commonly known as: COLACE Take 1 capsule (100 mg total) by mouth 2 (two) times daily as needed for mild constipation.   feeding supplement (ENSURE ENLIVE) Liqd Take 237 mLs by mouth 3 (three) times daily between meals.   ondansetron 4 MG tablet Commonly known as: ZOFRAN Take 1 tablet (4 mg total) by mouth every 6 (six) hours as needed for nausea.   traMADol 50 MG tablet Commonly known as: ULTRAM Take 1 tablet (50 mg total) by mouth every 6 (six) hours as needed for moderate pain or severe pain.  Durable Medical Equipment  (From admission, onward)         Start     Ordered   07/16/19 1245  For home use only DME Other see comment  Once    Comments: Speciality wheel chair  Question:  Length of Need  Answer:  Lifetime   07/16/19 1245   07/16/19 1117  For home use only DME Hospital bed  Once    Question Answer Comment  Length of Need Lifetime   The above medical condition requires: Patient requires the ability to reposition frequently   Bed type Semi-electric    Trapeze Bar Yes   Support Surface: Low Air loss Mattress      07/16/19 1116           DISCHARGE INSTRUCTIONS:   Follow-up with Dr. Sampson Goon and looks like they set up with primary care physician in that office also.  If you experience worsening of your admission symptoms, develop shortness of breath, life threatening emergency, suicidal or homicidal thoughts you must seek medical attention immediately by calling 911 or calling your MD immediately  if symptoms less severe.  You Must read complete instructions/literature along with all the possible adverse reactions/side effects for all the Medicines you take and that have been prescribed to you. Take any new Medicines after you have completely understood and accept all the possible adverse reactions/side effects.   Please note  You were cared for by a hospitalist during your hospital stay. If you have any questions about your discharge medications or the care you received while you were in the hospital after you are discharged, you can call the unit and asked to speak with the hospitalist on call if the hospitalist that took care of you is not available. Once you are discharged, your primary care physician will handle any further medical issues. Please note that NO REFILLS for any discharge medications will be authorized once you are discharged, as it is imperative that you return to your primary care physician (or establish a relationship with a primary care physician if you do not have one) for your aftercare needs so that they can reassess your need for medications and monitor your lab values.    Today   CHIEF COMPLAINT:   Chief Complaint  Patient presents with  . Abdominal Pain    HISTORY OF PRESENT ILLNESS:  Jonathon York  is a 44 y.o. male with a known history of decubitus ulcers presents with infection.   VITAL SIGNS:  Blood pressure 108/67, pulse 65, temperature 97.8 F (36.6 C), temperature source Oral, resp. rate  16, height 5\' 9"  (1.753 m), weight 48.4 kg, SpO2 99 %.  I/O:    Intake/Output Summary (Last 24 hours) at 07/16/2019 1641 Last data filed at 07/16/2019 0700 Gross per 24 hour  Intake 1728.67 ml  Output 450 ml  Net 1278.67 ml    PHYSICAL EXAMINATION:  GENERAL:  44 y.o.-year-old patient lying in the bed with no acute distress.  EYES: Pupils equal, round, reactive to light and accommodation. No scleral icterus. HEENT: Head atraumatic, normocephalic.   LUNGS: Normal breath sounds bilaterally, no wheezing, rales,rhonchi or crepitation. No use of accessory muscles of respiration.  CARDIOVASCULAR: S1, S2 normal. No murmurs, rubs, or gallops.  ABDOMEN: Soft, non-tender, non-distended. EXTREMITIES: No pedal edema.  NEUROLOGIC: Cranial nerves II through XII are intact.  Lower extremity paralysis PSYCHIATRIC: The patient is alert and oriented x 3.  SKIN: Large decubiti is covered today  DATA REVIEW:   CBC  Recent Labs  Lab 07/14/19 0514  WBC 8.7  HGB 9.5*  HCT 32.2*  PLT 365    Chemistries  Recent Labs  Lab 07/13/19 1315 07/13/19 1315 07/14/19 0514 07/15/19 0611 07/16/19 0457  NA 139   < > 141  --   --   K 3.7   < > 3.1*  --   --   CL 100   < > 108  --   --   CO2 21*   < > 25  --   --   GLUCOSE 131*   < > 92  --   --   BUN 20   < > 15  --   --   CREATININE 0.55*   < > 0.44*   < > 0.48*  CALCIUM 9.9   < > 8.3*  --   --   AST 15  --   --   --   --   ALT 14  --   --   --   --   ALKPHOS 91  --   --   --   --   BILITOT 1.2  --   --   --   --    < > = values in this interval not displayed.    Microbiology Results  Results for orders placed or performed during the hospital encounter of 07/13/19  CULTURE, BLOOD (ROUTINE X 2) w Reflex to ID Panel     Status: None (Preliminary result)   Collection Time: 07/13/19  4:28 PM   Specimen: BLOOD  Result Value Ref Range Status   Specimen Description BLOOD BLOOD RIGHT HAND  Final   Special Requests   Final    BOTTLES DRAWN AEROBIC  AND ANAEROBIC Blood Culture adequate volume   Culture   Final    NO GROWTH 3 DAYS Performed at [facility], [address] [place], [place] [address]    Report Status PENDING  Incomplete  CULTURE, BLOOD (ROUTINE X 2) w Reflex to ID Panel     Status: None (Preliminary result)   Collection Time: 07/13/19  4:33 PM   Specimen: BLOOD  Result Value Ref Range Status   Specimen Description BLOOD BLOOD RIGHT FOREARM  Final   Special Requests   Final    BOTTLES DRAWN AEROBIC AND ANAEROBIC Blood Culture adequate volume   Culture   Final    NO GROWTH 3 DAYS Performed at [facility], [address] [place], [place] [address]    Report Status PENDING  Incomplete  SARS CORONAVIRUS 2 (TAT 6-24 HRS) Nasopharyngeal Nasopharyngeal Swab     Status: None   Collection Time: 07/13/19  4:42 PM   Specimen: Nasopharyngeal Swab  Result Value Ref Range Status   SARS Coronavirus 2 NEGATIVE NEGATIVE Final    Comment: (NOTE) SARS-CoV-2 target nucleic acids are NOT DETECTED. The SARS-CoV-2 RNA is generally detectable in upper and lower respiratory specimens during the acute phase of infection. Negative results do not preclude SARS-CoV-2 infection, do not rule out co-infections with other pathogens, and should not be used as the sole basis for treatment or other patient management decisions. Negative results must be combined with clinical observations, patient history, and epidemiological information. The expected result is Negative. Fact Sheet for Patients: HairSlick.no Fact Sheet for Healthcare Providers: quierodirigir.com This test is not yet approved or cleared by the [place] FDA and  has been authorized for detection and/or diagnosis of SARS-CoV-2 by FDA under an Emergency Use Authorization (  EUA). This EUA will remain  in effect (meaning this test can be used) for the duration of the COVID-19 declaration under Section 56  4(b)(1) of the Act, 21 U.S.C. section 360bbb-3(b)(1), unless the authorization is terminated or revoked sooner. Performed at [facility], [place] [address] [place], [place] [address]   Urine culture     Status: Abnormal   Collection Time: 07/13/19  6:08 PM   Specimen: Urine, Random  Result Value Ref Range Status   Specimen Description   Final    URINE, RANDOM Performed at [facility], [address], [place], [place] [address]    Special Requests   Final    NONE Performed at [facility], [address] [place], [place] [address]    Culture MULTIPLE SPECIES PRESENT, SUGGEST RECOLLECTION (A)  Final   Report Status 07/15/2019 FINAL  Final  Aerobic/Anaerobic Culture (surgical/deep wound)     Status: None (Preliminary result)   Collection Time: 07/14/19 10:18 AM   Specimen: Wound  Result Value Ref Range Status   Specimen Description   Final    WOUND Performed at [facility], [address] [place], [place] [address]    Special Requests   Final    SACRAL Performed at [facility], [address] [place], [place] [address]    Gram Stain   Final    NO WBC SEEN NO ORGANISMS SEEN Performed at [facility], [place] [address] [place], [place] [address]    Culture   Final    RARE GROUP B STREP(S.AGALACTIAE)ISOLATED TESTING AGAINST S. AGALACTIAE NOT ROUTINELY PERFORMED DUE TO PREDICTABILITY OF AMP/PEN/VAN SUSCEPTIBILITY. NO ANAEROBES ISOLATED; CULTURE IN PROGRESS FOR 5 DAYS    Report Status PENDING  Incomplete  MRSA PCR Screening     Status: None   Collection Time: 07/14/19 10:18 AM   Specimen: Nasopharyngeal  Result Value Ref Range Status   MRSA by PCR NEGATIVE NEGATIVE Final    Comment:        The GeneXpert MRSA Assay (FDA approved for NASAL specimens only), is one component of a comprehensive MRSA colonization surveillance program. It is not intended to diagnose MRSA infection nor to guide or monitor treatment  for MRSA infections. Performed at [facility], [address] [place],  [address]      Management plans discussed with the patient, and he is in agreement.  CODE STATUS:     Code Status Orders  (From admission, onward)         Start     Ordered   07/13/19 1627  Full code  Continuous     07/13/19 1627        Code Status History    Date Active Date Inactive Code Status Order ID Comments User Context   11/26/2018 0411 11/30/2018 2011 Full Code [id number]  Loletha Grayer, MD ED   04/22/2018 2308 04/23/2018 1622 Full Code [id number]  Lance Coon, MD Inpatient   03/18/2018 1119 03/23/2018 1659 Full Code [id number]  Henreitta Leber, MD ED   02/14/2017 2321 02/19/2017 2046 Full Code [id number]  Lance Coon, MD Inpatient   11/19/2016 1720 11/21/2016 2207 Full Code [id number]  Dustin Flock, MD Inpatient   04/15/2016 1802 04/19/2016 0211 Full Code [id number]  Demetrios Loll, MD Inpatient   04/29/2015 1017 04/30/2015 1339 Full Code [id number]  Sharlotte Alamo, MD Inpatient   04/28/2015 0438 04/29/2015 1017 Full Code [id number]  Hillary Bow, MD ED   Advance Care Planning Activity  TOTAL TIME TAKING CARE OF THIS PATIENT: 34 minutes.    Alford Highlandichard Niels Cranshaw M.D on 07/16/2019 at 4:41 PM  Between 7am to 6pm - Pager - [phone]  After 6pm go to [email or web address] - password [email or web address]  [facility]  CC: Primary care physician; Dr Sampson GoonFitzgerald

## 2019-07-16 NOTE — Progress Notes (Signed)
Durell L Gianfrancesco A and O x4. VSS. Pt tolerating diet well. No complaints of nausea or vomiting. IV removed intact, prescriptions given. Pt voices understanding of discharge instructions with no further questions. Patient discharged via personal wheelchair.   Allergies as of 07/16/2019      Reactions   Orange Fruit [citrus] Other (See Comments)   Blisters only from ORANGES!!! Patient is not allergic from all citrus      Medication List    TAKE these medications   amoxicillin-clavulanate 875-125 MG tablet Commonly known as: Augmentin Take 1 tablet by mouth 2 (two) times daily for 10 days.   ascorbic acid 500 MG tablet Commonly known as: VITAMIN C Take 1 tablet (500 mg total) by mouth 2 (two) times daily.   docusate sodium 100 MG capsule Commonly known as: COLACE Take 1 capsule (100 mg total) by mouth 2 (two) times daily as needed for mild constipation.   feeding supplement (ENSURE ENLIVE) Liqd Take 237 mLs by mouth 3 (three) times daily between meals.   ondansetron 4 MG tablet Commonly known as: ZOFRAN Take 1 tablet (4 mg total) by mouth every 6 (six) hours as needed for nausea.   traMADol 50 MG tablet Commonly known as: ULTRAM Take 1 tablet (50 mg total) by mouth every 6 (six) hours as needed for moderate pain or severe pain.            Durable Medical Equipment  (From admission, onward)         Start     Ordered   07/16/19 1245  For home use only DME Other see comment  Once    Comments: Speciality wheel chair  Question:  Length of Need  Answer:  Lifetime   07/16/19 1245   07/16/19 1117  For home use only DME Hospital bed  Once    Question Answer Comment  Length of Need Lifetime   The above medical condition requires: Patient requires the ability to reposition frequently   Bed type Semi-electric   Trapeze Bar Yes   Support Surface: Low Air loss Mattress      07/16/19 1116          Vitals:   07/15/19 1941 07/16/19 0401  BP: 110/64 108/67  Pulse: 73 65   Resp: 18 16  Temp: 98.4 F (36.9 C) 97.8 F (36.6 C)  SpO2: 100% 99%    Bertha Stakes

## 2019-07-16 NOTE — Care Management (Signed)
Patient to discharge home today.  Start of home health through [facility] to be Monday.  Dr Renae Gloss aware and in agreement

## 2019-07-16 NOTE — Care Management Important Message (Signed)
Important Message  Patient Details  Name: Jonathon York MRN: [id number] Date of Birth: [birth year]   Medicare Important Message Given:  Yes     [name] 07/16/2019, 1:52 PM

## 2019-07-16 NOTE — Care Management (Signed)
Patient has stage 4 pressure ulcers/paraplegia  which requires sacrum to be positioned in ways not feasible with a normal bed. Head must be elevated at least 30 drgrees or severe pain occurs . Sacral wounds frequently requires frequent  changes in body position which cannot be achieved with a normal bed.  Patient will also need low air loss mattress due to stage 4 ulcer

## 2019-07-16 NOTE — TOC Initial Note (Addendum)
Transition of Care [facility]) - Initial/Assessment Note    Patient Details  Name: Jonathon York MRN: [id number] Date of Birth: 1976/02/18  [facility]) CM/SW Contact:    Beverly Sessions, RN Phone Number: 07/16/2019, 10:39 AM  Clinical Narrative:                 Patient admitted from home with sacral decubitus   Patient states that he lives at home alone.   Baseline he is WC bound, is able to drive himself to appointments.  States his children will be picking him up from the hospital today.   Patient states the only home equipment he has is a WC, and there are no other needs   Pharmacy [facility] - denies issues obtaining medications  MD has ordered home health RN.  Will discharge on oral antibiotics.  Patient request that I check with [facility] first.  Corene Cornea with [facility] given referral, and will see if he can accept the patient.   UPDATECorene Cornea with [facility] has accepted referral  Referral made to [facility] with [facility] for hospital bed, low air loss mattress, and speciality wheelchair   Dr. Ola Spurr has accepted patient for new PCP appointment  Expected Discharge Plan: [place] Barriers to Discharge: No Barriers Identified   Patient Goals and CMS Choice        Expected Discharge Plan and Services Expected Discharge Plan: [place]   Discharge Planning Services: CM Consult   Living arrangements for the past 2 months: Single Family Home Expected Discharge Date: 07/16/19                         HH Arranged: RN [place] Agency: [place] ([place]) Date [place]: 07/16/19   Representative spoke with at [place]: Corene Cornea  Prior Living Arrangements/Services Living arrangements for the past 2 months: [place] Lives with:: Self              Current home services: DME    Activities of Daily Living Home Assistive Devices/Equipment: Wheelchair ADL  Screening (condition at time of admission) Patient's cognitive ability adequate to safely complete daily activities?: No Is the patient deaf or have difficulty hearing?: No Does the patient have difficulty seeing, even when wearing glasses/contacts?: No Does the patient have difficulty concentrating, remembering, or making decisions?: No Patient able to express need for assistance with ADLs?: Yes Does the patient have difficulty dressing or bathing?: No Independently performs ADLs?: Yes (appropriate for developmental age) Does the patient have difficulty walking or climbing stairs?: Yes Weakness of Legs: Both Weakness of Arms/Hands: None  Permission Sought/Granted                  Emotional Assessment Appearance:: Appears stated age Attitude/Demeanor/Rapport: Engaged Affect (typically observed): Accepting Orientation: : Oriented to Self, Oriented to Place, Oriented to  Time, Oriented to Situation   Psych Involvement: No (comment)  Admission diagnosis:  Sacral decubitus ulcer [L89.159] Sacral decubitus ulcer, stage IV ([place]) [L89.154] Patient Active Problem List   Diagnosis Date Noted  . Chronic osteomyelitis ([place])   . Constipation   . Iron deficiency anemia   . Sacral decubitus ulcer, stage IV ([place]) 07/13/2019  . Intractable pain 11/26/2018  . Anxiety 04/22/2018  . Acute osteomyelitis of right foot ([place]) 03/18/2018  . Pressure injury of skin 03/18/2018  . Stage IV pressure ulcer of right  buttock (HCC)   . Protein-calorie malnutrition, severe 11/20/2016  . Herpes simplex infection of penis   . Decubitus ulcer 11/19/2016  . 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 Decubitus ulcer of sacral region   . Sepsis (HCC) 04/15/2016  . 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 Pressure ulcer stage III 04/30/2015  . Sterile pyuria 04/30/2015  . Paraplegia (HCC) 04/30/2015  . Neurogenic bladder 04/30/2015  . Toe  osteomyelitis, right (HCC) 04/28/2015  . Moderate malnutrition (HCC) 04/28/2015   PCP:  [facility]:   [facility] [address] [place] - [address] [place] [address] Phone: [phone] Fax: [phone]     Social Determinants of Health (SDOH) Interventions    Readmission Risk Interventions No flowsheet data found.

## 2019-07-18 LAB — CULTURE, BLOOD (ROUTINE X 2)
Culture: NO GROWTH
Culture: NO GROWTH
Special Requests: ADEQUATE
Special Requests: ADEQUATE

## 2019-07-19 LAB — AEROBIC/ANAEROBIC CULTURE W GRAM STAIN (SURGICAL/DEEP WOUND): Gram Stain: NONE SEEN

## 2019-07-21 ENCOUNTER — Other Ambulatory Visit: Payer: Self-pay

## 2019-07-21 ENCOUNTER — Encounter: Payer: Medicare Other | Attending: Internal Medicine | Admitting: Internal Medicine

## 2019-07-21 DIAGNOSIS — G8221 Paraplegia, complete: Secondary | ICD-10-CM | POA: Diagnosis not present

## 2019-07-21 DIAGNOSIS — Z87891 Personal history of nicotine dependence: Secondary | ICD-10-CM | POA: Diagnosis not present

## 2019-07-21 DIAGNOSIS — L89323 Pressure ulcer of left buttock, stage 3: Secondary | ICD-10-CM | POA: Diagnosis not present

## 2019-07-21 DIAGNOSIS — M8668 Other chronic osteomyelitis, other site: Secondary | ICD-10-CM | POA: Insufficient documentation

## 2019-07-21 DIAGNOSIS — L89153 Pressure ulcer of sacral region, stage 3: Secondary | ICD-10-CM | POA: Insufficient documentation

## 2019-07-21 DIAGNOSIS — Z89431 Acquired absence of right foot: Secondary | ICD-10-CM | POA: Diagnosis not present

## 2019-07-21 DIAGNOSIS — E46 Unspecified protein-calorie malnutrition: Secondary | ICD-10-CM | POA: Diagnosis not present

## 2019-07-21 DIAGNOSIS — L89313 Pressure ulcer of right buttock, stage 3: Secondary | ICD-10-CM | POA: Diagnosis not present

## 2019-07-21 NOTE — Progress Notes (Addendum)
MALIKIAH, DEBARR ([id number]) Visit Report for 07/21/2019 Abuse/Suicide Risk Screen Details Patient Name: Jonathon York, Jonathon York. Date of Service: 07/21/2019 1:15 PM Medical Record Number: [id number] Patient Account Number: [id number] Date of Birth/Sex: 1975/08/28 (44 y.o. M) Treating RN: Montey Hora Primary Care Chung Chagoya: Ola Spurr, DAVID Other Clinician: Referring Nilan Iddings: Loletha Grayer Treating Teyah Rossy/Extender: Tito Dine in Treatment: 0 Abuse/Suicide Risk Screen Items Answer ABUSE RISK SCREEN: Has anyone close to you tried to hurt or harm you recentlyo No Do you feel uncomfortable with anyone in your familyo No Has anyone forced you do things that you didnot want to doo No Electronic Signature(s) Signed: 07/21/2019 4:33:06 PM By: Montey Hora Entered By: Montey Hora on 07/21/2019 13:17:27 Kneece, Janine Ores ([id number]) -------------------------------------------------------------------------------- Activities of Daily Living Details Patient Name: Jonathon York, Jonathon York. Date of Service: 07/21/2019 1:15 PM Medical Record Number: [id number] Patient Account Number: [id number] Date of Birth/Sex: November 18, 1975 (44 y.o. M) Treating RN: Montey Hora Primary Care Leonda Cristo: Ola Spurr, DAVID Other Clinician: Referring Latoya Maulding: Loletha Grayer Treating Timberly Yott/Extender: Tito Dine in Treatment: 0 Activities of Daily Living Items Answer Activities of Daily Living (Please select one for each item) Drive Automobile Completely Able Take Medications Completely Able Use Telephone Completely Able Care for Appearance Completely Able Use Toilet Completely Able Bath / Shower Completely Able Dress Self Completely Able Feed Self Completely Able Walk Completely Able Get In / Out Bed Completely Able Housework Completely Able Prepare Meals Completely Able Handle Money Completely Able Shop for Self Completely Able Electronic Signature(s) Signed: 07/21/2019  4:33:06 PM By: Montey Hora Entered By: Montey Hora on 07/21/2019 13:17:47 Jonathon York ([id number]) -------------------------------------------------------------------------------- Education Screening Details Patient Name: Jonathon York. Date of Service: 07/21/2019 1:15 PM Medical Record Number: [id number] Patient Account Number: [id number] Date of Birth/Sex: Jan 01, 1976 (44 y.o. M) Treating RN: Montey Hora Primary Care Amdrew Oboyle: Ola Spurr, DAVID Other Clinician: Referring Brystal Kildow: Loletha Grayer Treating Shifa Brisbon/Extender: Tito Dine in Treatment: 0 Primary Learner Assessed: Patient Learning Preferences/Education Level/Primary Language Learning Preference: Explanation, Demonstration Highest Education Level: College or Above Preferred Language: English Cognitive Barrier Language Barrier: No Translator Needed: No Memory Deficit: No Emotional Barrier: No Cultural/Religious Beliefs Affecting Medical Care: No Physical Barrier Impaired Vision: No Impaired Hearing: No Decreased Hand dexterity: No Knowledge/Comprehension Knowledge Level: Medium Comprehension Level: Medium Ability to understand written instructions: Medium Ability to understand verbal instructions: Medium Motivation Anxiety Level: Calm Cooperation: Cooperative Education Importance: Acknowledges Need Interest in Health Problems: Asks Questions Perception: Coherent Willingness to Engage in Self-Management Medium Activities: Readiness to Engage in Self-Management Medium Activities: Electronic Signature(s) Signed: 07/21/2019 4:33:06 PM By: Montey Hora Entered By: Montey Hora on 07/21/2019 13:18:08 Jonathon York ([id number]) -------------------------------------------------------------------------------- Fall Risk Assessment Details Patient Name: Jonathon York. Date of Service: 07/21/2019 1:15 PM Medical Record Number: [id number] Patient Account Number:  [id number] Date of Birth/Sex: [birth year] (44 y.o. M) Treating RN: Montey Hora Primary Care Riker Collier: FITZGERALD, DAVID Other Clinician: Referring Blane Worthington: Loletha Grayer Treating Jentzen Minasyan/Extender: Tito Dine in Treatment: 0 Fall Risk Assessment Items Have you had 2 or more falls in the last 12 monthso 0 No Have you had any fall that resulted in injury in the last 12 monthso 0 No FALLS RISK SCREEN History of falling - immediate or within 3 months 0 No Secondary diagnosis (Do you have 2 or more medical diagnoseso) 0 No Ambulatory aid None/bed rest/wheelchair/nurse 0 Yes Crutches/cane/walker 0 No Furniture 0 No Intravenous therapy Access/Saline/Heparin Lock 0 No Gait/Transferring Normal/ bed rest/ wheelchair 0 Yes Weak (short  steps with or without shuffle, stooped but able to lift head while walking, may seek 0 No support from furniture) Impaired (short steps with shuffle, may have difficulty arising from chair, head down, impaired 0 No balance) Mental Status Oriented to own ability 0 Yes Electronic Signature(s) Signed: 07/21/2019 4:33:06 PM By: Curtis Sites Entered By: Curtis Sites on 07/21/2019 13:18:26 Jonathon York ([id number]) -------------------------------------------------------------------------------- Foot Assessment Details Patient Name: Jonathon York. Date of Service: 07/21/2019 1:15 PM Medical Record Number: [id number] Patient Account Number: [id number] Date of Birth/Sex: Nov 12, 1975 (44 y.o. M) Treating RN: Curtis Sites Primary Care Ellana Kawa: FITZGERALD, DAVID Other Clinician: Referring Lyza Houseworth: Alford Highland Treating Shyia Fillingim/Extender: Altamese Dodson in Treatment: 0 Foot Assessment Items Site Locations + = Sensation present, - = Sensation absent, C = Callus, U = Ulcer R = Redness, W = Warmth, M = Maceration, PU = Pre-ulcerative lesion F = Fissure, S = Swelling, D = Dryness Assessment Right: Left: Other Deformity: No  No Prior Foot Ulcer: No No Prior Amputation: No No Charcot Joint: No No Ambulatory Status: Non-ambulatory Assistance Device: Wheelchair GaitFutures trader) Signed: 07/21/2019 4:33:06 PM By: Curtis Sites Entered By: Curtis Sites on 07/21/2019 13:18:53 Jonathon York ([id number]) -------------------------------------------------------------------------------- Nutrition Risk Screening Details Patient Name: Jonathon York. Date of Service: 07/21/2019 1:15 PM Medical Record Number: [id number] Patient Account Number: [id number] Date of Birth/Sex: July 21, 1975 (44 y.o. M) Treating RN: Curtis Sites Primary Care Jerrel Tiberio: FITZGERALD, DAVID Other Clinician: Referring Holt Woolbright: Alford Highland Treating Hoyt Leanos/Extender: Altamese Lebam in Treatment: 0 Height (in): 70 Weight (lbs): 98 Body Mass Index (BMI): 14.1 Nutrition Risk Screening Items Score Screening NUTRITION RISK SCREEN: I have an illness or condition that made me change the kind and/or amount of food I eat 0 No I eat fewer than two meals per day 0 No I eat few fruits and vegetables, or milk products 0 No I have three or more drinks of beer, liquor or wine almost every day 0 No I have tooth or mouth problems that make it hard for me to eat 0 No I don't always have enough money to buy the food I need 0 No I eat alone most of the time 0 No I take three or more different prescribed or over-the-counter drugs a day 1 Yes Without wanting to, I have lost or gained 10 pounds in the last six months 0 No I am not always physically able to shop, cook and/or feed myself 0 No Nutrition Protocols Good Risk Protocol 0 No interventions needed Moderate Risk Protocol High Risk Proctocol Risk Level: Good Risk Score: 1 Electronic Signature(s) Signed: 07/21/2019 4:33:06 PM By: Curtis Sites Entered By: Curtis Sites on 07/21/2019 13:18:35

## 2019-07-23 ENCOUNTER — Ambulatory Visit: Payer: Medicare Other | Admitting: Physician Assistant

## 2019-08-04 ENCOUNTER — Ambulatory Visit: Payer: Medicare Other | Admitting: Internal Medicine

## 2019-08-11 ENCOUNTER — Encounter: Payer: Medicare Other | Attending: Internal Medicine | Admitting: Internal Medicine

## 2019-08-11 ENCOUNTER — Other Ambulatory Visit: Payer: Self-pay

## 2019-08-11 DIAGNOSIS — L89153 Pressure ulcer of sacral region, stage 3: Secondary | ICD-10-CM | POA: Diagnosis present

## 2019-08-11 DIAGNOSIS — G8221 Paraplegia, complete: Secondary | ICD-10-CM | POA: Diagnosis not present

## 2019-08-11 DIAGNOSIS — L89323 Pressure ulcer of left buttock, stage 3: Secondary | ICD-10-CM | POA: Insufficient documentation

## 2019-08-11 DIAGNOSIS — L89313 Pressure ulcer of right buttock, stage 3: Secondary | ICD-10-CM | POA: Diagnosis not present

## 2019-08-11 NOTE — Progress Notes (Signed)
RIPKEN, REKOWSKI ([id number]) Visit Report for 07/21/2019 Allergy List Details Patient Name: Jonathon York, Jonathon York. Date of Service: 07/21/2019 1:15 PM Medical Record Number: [id number] Patient Account Number: [id number] Date of Birth/Sex: April 20, 1976 (44 y.o. M) Treating RN: Curtis Sites Primary Care Wardell Pokorski: Sampson Goon, DAVID Other Clinician: Referring Stephanine Reas: Alford Highland Treating Kelbie Moro/Extender: Maxwell Caul Weeks in Treatment: 0 Allergies Active Allergies No Known Allergies Allergy Notes Electronic Signature(s) Signed: 07/21/2019 4:33:06 PM By: Curtis Sites Entered By: Curtis Sites on 07/21/2019 13:15:34 Jonathon York ([id number]) -------------------------------------------------------------------------------- Arrival Information Details Patient Name: Jonathon York, Jonathon York. Date of Service: 07/21/2019 1:15 PM Medical Record Number: [id number] Patient Account Number: [id number] Date of Birth/Sex: 03/22/1976 (44 y.o. M) Treating RN: Curtis Sites Primary Care Jaquelyne Firkus: FITZGERALD, DAVID Other Clinician: Referring Carlas Vandyne: Alford Highland Treating July Nickson/Extender: Altamese Camargo in Treatment: 0 Visit Information Patient Arrived: Wheel Chair Arrival Time: 13:12 Accompanied By: self Transfer Assistance: None Patient Identification Verified: Yes Secondary Verification Process Completed: Yes History Since Last Visit Added or deleted any medications: No Any new allergies or adverse reactions: No Had a fall or experienced change in activities of daily living that may affect risk of falls: No Signs or symptoms of abuse/neglect since last visito No Hospitalized since last visit: No Implantable device outside of the clinic excluding cellular tissue based products placed in the center since last visit: No Electronic Signature(s) Signed: 07/21/2019 4:33:06 PM By: Curtis Sites Entered By: Curtis Sites on 07/21/2019 13:14:31 Jonathon York  ([id number]) -------------------------------------------------------------------------------- Clinic Level of Care Assessment Details Patient Name: Jonathon York. Date of Service: 07/21/2019 1:15 PM Medical Record Number: [id number] Patient Account Number: [id number] Date of Birth/Sex: February 23, 1976 (44 y.o. M) Treating RN: Huel Coventry Primary Care Lachlan Mckim: FITZGERALD, DAVID Other Clinician: Referring Mekel Haverstock: Alford Highland Treating Keyla Milone/Extender: Altamese [place] in Treatment: 0 Clinic Level of Care Assessment Items TOOL 2 Quantity Score []  - Use when only an EandM is performed on the INITIAL visit 0 ASSESSMENTS - Nursing Assessment / Reassessment X - General Physical Exam (combine w/ comprehensive assessment (listed just below) when performed on new pt. 1 20 evals) X- 1 25 Comprehensive Assessment (HX, ROS, Risk Assessments, Wounds Hx, etc.) ASSESSMENTS - Wound and Skin Assessment / Reassessment []  - Simple Wound Assessment / Reassessment - one wound 0 X- 4 5 Complex Wound Assessment / Reassessment - multiple wounds []  - 0 Dermatologic / Skin Assessment (not related to wound area) ASSESSMENTS - Ostomy and/or Continence Assessment and Care []  - Incontinence Assessment and Management 0 []  - 0 Ostomy Care Assessment and Management (repouching, etc.) PROCESS - Coordination of Care X - Simple Patient / Family Education for ongoing care 1 15 []  - 0 Complex (extensive) Patient / Family Education for ongoing care X- 1 10 Staff obtains , Records, Test Results / Process Orders []  - 0 Staff telephones HHA, Nursing Homes / Clarify orders / etc []  - 0 Routine Transfer to another Facility (non-emergent condition) []  - 0 Routine Hospital Admission (non-emergent condition) X- 1 15 New Admissions / / Ordering NPWT, Apligraf, etc. []  - 0 Emergency Hospital Admission (emergent condition) X- 1 10 Simple Discharge Coordination []  - 0 Complex  (extensive) Discharge Coordination PROCESS - Special Needs []  - Pediatric / Minor Patient Management 0 []  - 0 Isolation Patient Management []  - 0 Hearing / Language / Visual special needs []  - 0 Assessment of Community assistance (transportation, D/C planning, etc.) []  - 0 Additional assistance / Altered mentation []  - 0 Support  Surface(s) Assessment (bed, cushion, seat, etc.) INTERVENTIONS - Wound Cleansing / Measurement X - Wound Imaging (photographs - any number of wounds) 1 5 Hollinsworth, Dayle L. ([id number]) []  - 0 Wound Tracing (instead of photographs) []  - 0 Simple Wound Measurement - one wound X- 4 5 Complex Wound Measurement - multiple wounds []  - 0 Simple Wound Cleansing - one wound X- 4 5 Complex Wound Cleansing - multiple wounds INTERVENTIONS - Wound Dressings []  - Small Wound Dressing one or multiple wounds 0 []  - 0 Medium Wound Dressing one or multiple wounds X- 3 20 Large Wound Dressing one or multiple wounds []  - 0 Application of Medications - injection INTERVENTIONS - Miscellaneous []  - External ear exam 0 []  - 0 Specimen Collection (cultures, biopsies, blood, body fluids, etc.) []  - 0 Specimen(s) / Culture(s) sent or taken to Lab for analysis []  - 0 Patient Transfer (multiple staff / Nurse, adultHoyer Lift / Similar devices) []  - 0 Simple Staple / Suture removal (25 or less) []  - 0 Complex Staple / Suture removal (26 or more) []  - 0 Hypo / Hyperglycemic Management (close monitor of Blood Glucose) []  - 0 Ankle / Brachial Index (ABI) - do not check if billed separately Has the patient been seen at the hospital within the last three years: Yes Total Score: 220 Level Of Care: New/Established - Level 5 Electronic Signature(s) Signed: 08/11/2019 11:11:47 AM By: Elliot GurneyWoody, BSN, RN, CWS, Kim RN, BSN Entered By: Elliot GurneyWoody, BSN, RN, CWS, Kim on 07/21/2019 14:04:29 Jonathon York, Jonathon L.  ([id number]) -------------------------------------------------------------------------------- Encounter Discharge Information Details Patient Name: Jonathon York, Jonathon L. Date of Service: 07/21/2019 1:15 PM Medical Record Number: [id number] Patient Account Number: [id number] Date of Birth/Sex: May 08, 1975 (44 y.o. M) Treating RN: Huel CoventryWoody, Kim Primary Care Keyauna Graefe: Sampson GoonFITZGERALD, DAVID Other Clinician: Referring Brendalyn Vallely: Alford HighlandWieting, Camryn Treating Misheel Gowans/Extender: Altamese CarolinaOBSON, MICHAEL G Weeks in Treatment: 0 Encounter Discharge Information Items Discharge Condition: Stable Ambulatory Status: Wheelchair Discharge Destination: Home Transportation: Private Auto Accompanied By: self Schedule Follow-up Appointment: Yes Clinical Summary of Care: Electronic Signature(s) Signed: 08/11/2019 11:11:47 AM By: Elliot GurneyWoody, BSN, RN, CWS, Kim RN, BSN Entered By: Elliot GurneyWoody, BSN, RN, CWS, Kim on 07/21/2019 14:05:49 Jonathon York, Jonathon L. ([id number]) -------------------------------------------------------------------------------- Lower Extremity Assessment Details Patient Name: Jonathon York, Jonathon L. Date of Service: 07/21/2019 1:15 PM Medical Record Number: [id number] Patient Account Number: [id number] Date of Birth/Sex: May 08, 1975 (44 y.o. M) Treating RN: Curtis Sitesorthy, Joanna Primary Care Cipriana Biller: Sampson GoonFITZGERALD, DAVID Other Clinician: Referring Uno Esau: Alford HighlandWieting, Natalie Treating Keante Urizar/Extender: Altamese CarolinaOBSON, MICHAEL G Weeks in Treatment: 0 Electronic Signature(s) Signed: 07/21/2019 4:33:06 PM By: Curtis Sitesorthy, Joanna Entered By: Curtis Sitesorthy, Joanna on 07/21/2019 13:15:28 Jonathon York, Gearald L. ([id number]) -------------------------------------------------------------------------------- Multi Wound Chart Details Patient Name: Jonathon York, Jonathon L. Date of Service: 07/21/2019 1:15 PM Medical Record Number: [id number] Patient Account Number: [id number] Date of Birth/Sex: May 08, 1975 (44 y.o. M) Treating RN: Huel CoventryWoody, Kim Primary Care Jacion Dismore: FITZGERALD, DAVID  Other Clinician: Referring Jakari Sada: Alford HighlandWieting, Rease Treating Tru Leopard/Extender: Altamese CarolinaOBSON, MICHAEL G Weeks in Treatment: 0 Vital Signs Height(in): Pulse(bpm): 92 Weight(lbs): 106 Blood Pressure(mmHg): 132/65 Body Mass Index(BMI): Temperature(F): 98.5 Respiratory Rate(breaths/min): 16 Photos: Wound Location: Sacrum - Midline Right Sacrum Right Ischium Wounding Event: Pressure Injury Pressure Injury Pressure Injury Primary Etiology: Pressure Ulcer Pressure Ulcer Pressure Ulcer Comorbid History: Paraplegia Paraplegia Paraplegia Date Acquired: 04/30/2011 06/28/2019 04/29/2012 Weeks of Treatment: 0 0 0 Wound Status: Open Open Open Measurements L x W x D (cm) 3.8x6x0.2 0.7x0.4x0.1 4.5x8x0.7 Area (cm) : 17.907 0.22 28.274 Volume (cm) : 3.581 0.022 19.792 Position 1 (o'clock): 7 Maximum Distance 1 (cm): 3.7 Tunneling:  No No Yes Classification: Category/Stage III Category/Stage II Category/Stage III Exudate Amount: Medium Small Medium Exudate Type: Serous Serous Serous Exudate Color: amber amber amber Wound Margin: Epibole Flat and Intact Flat and Intact Granulation Amount: Large (67-100%) Large (67-100%) Large (67-100%) Granulation Quality: Pink Pink Pink Necrotic Amount: Small (1-33%) Small (1-33%) Small (1-33%) Exposed Structures: Fat Layer (Subcutaneous Tissue) Fat Layer (Subcutaneous Tissue) Fat Layer (Subcutaneous Tissue) Exposed: Yes Exposed: Yes Exposed: Yes Fascia: No Fascia: No Fascia: No Tendon: No Tendon: No Tendon: No Muscle: No Muscle: No Muscle: No Joint: No Joint: No Joint: No Bone: No Bone: No Bone: No Epithelialization: None Small (1-33%) None Wound Number: 13 N/A N/A Photos: N/A N/A SULAYMAN, MANNING ([id number]) Wound Location: Left Ischium N/A N/A Wounding Event: Pressure Injury N/A N/A Primary Etiology: Pressure Ulcer N/A N/A Comorbid History: Paraplegia N/A N/A Date Acquired: 04/29/2013 N/A N/A Weeks of Treatment: 0 N/A N/A Wound Status: Open  N/A N/A Measurements L x W x D (cm) 2.4x5x1.3 N/A N/A Area (cm) : 9.425 N/A N/A Volume (cm) : 12.252 N/A N/A Tunneling: No N/A N/A Classification: Category/Stage III N/A N/A Exudate Amount: Medium N/A N/A Exudate Type: Serous N/A N/A Exudate Color: amber N/A N/A Wound Margin: Flat and Intact N/A N/A Granulation Amount: Large (67-100%) N/A N/A Granulation Quality: Pink N/A N/A Necrotic Amount: Small (1-33%) N/A N/A Exposed Structures: Fat Layer (Subcutaneous Tissue) N/A N/A Exposed: Yes Fascia: No Tendon: No Muscle: No Joint: No Bone: No Epithelialization: None N/A N/A Treatment Notes Electronic Signature(s) Signed: 07/22/2019 4:13:18 AM By: Baltazar Najjar MD Entered By: Baltazar Najjar on 07/21/2019 13:59:39 Jonathon York ([id number]) -------------------------------------------------------------------------------- Multi-Disciplinary Care Plan Details Patient Name: Jonathon York, Jonathon L. Date of Service: 07/21/2019 1:15 PM Medical Record Number: [id number] Patient Account Number: [id number] Date of Birth/Sex: Apr 15, 1976 (44 y.o. M) Treating RN: Huel Coventry Primary Care Tywon Niday: Sampson Goon, DAVID Other Clinician: Referring Delfina Schreurs: Alford Highland Treating Alexanderia Gorby/Extender: Altamese Bieber in Treatment: 0 Active Inactive Orientation to the Wound Care Program Nursing Diagnoses: Knowledge deficit related to the wound healing center program Goals: Patient/caregiver will verbalize understanding of the Wound Healing Center Program Date Initiated: 07/21/2019 Target Resolution Date: 07/21/2019 Goal Status: Active Interventions: Provide education on orientation to the wound center Notes: Pressure Nursing Diagnoses: Knowledge deficit related to management of pressures ulcers Potential for impaired tissue integrity related to pressure, friction, moisture, and shear Goals: Patient/caregiver will verbalize understanding of pressure ulcer management Date Initiated:  07/21/2019 Target Resolution Date: 07/28/2019 Goal Status: Active Interventions: Assess: immobility, friction, shearing, incontinence upon admission and as needed Assess offloading mechanisms upon admission and as needed Notes: Wound/Skin Impairment Nursing Diagnoses: Impaired tissue integrity Goals: Ulcer/skin breakdown will have a volume reduction of 30% by week 4 Date Initiated: 07/21/2019 Target Resolution Date: 08/21/2019 Goal Status: Active Interventions: Assess ulceration(s) every visit Treatment Activities: Skin care regimen initiated : 07/21/2019 Notes: CHICO, CAWOOD ([id number]) Electronic Signature(s) Signed: 08/11/2019 11:11:47 AM By: Elliot Gurney, BSN, RN, CWS, Kim RN, BSN Entered By: Elliot Gurney, BSN, RN, CWS, Kim on 07/21/2019 13:53:21 Jonathon York ([id number]) -------------------------------------------------------------------------------- Pain Assessment Details Patient Name: Jonathon York, Jonathon L. Date of Service: 07/21/2019 1:15 PM Medical Record Number: [id number] Patient Account Number: [id number] Date of Birth/Sex: Sep 03, 1975 (44 y.o. M) Treating RN: Curtis Sites Primary Care Sharisa Toves: Sampson Goon, DAVID Other Clinician: Referring Levonte Molina: Alford Highland Treating Aswad Wandrey/Extender: Altamese [place] in Treatment: 0 Active Problems Location of Pain Severity and Description of Pain Patient Has Paino Yes Site Locations Pain Location: Pain in Ulcers With Dressing  Change: Yes Duration of the Pain. Constant / Intermittento Constant Pain Management and Medication Current Pain Management: Electronic Signature(s) Signed: 07/21/2019 4:33:06 PM By: Curtis Sites Entered By: Curtis Sites on 07/21/2019 13:15:18 Jonathon York ([id number]) -------------------------------------------------------------------------------- Patient/Caregiver Education Details Patient Name: Jonathon York. Date of Service: 07/21/2019 1:15 PM Medical Record Number:  [id number] Patient Account Number: [id number] Date of Birth/Gender: 30-Jun-1975 (44 y.o. M) Treating RN: Huel Coventry Primary Care Physician: Sampson Goon, DAVID Other Clinician: Referring Physician: Alford Highland Treating Physician/Extender: Altamese Archer in Treatment: 0 Education Assessment Education Provided To: Patient Education Topics Provided Pressure: Handouts: Pressure Ulcers: Care and Offloading Methods: Demonstration, Explain/Verbal Responses: State content correctly Wound/Skin Impairment: Handouts: Caring for Your Ulcer Methods: Demonstration, Explain/Verbal Responses: State content correctly Electronic Signature(s) Signed: 08/11/2019 11:11:47 AM By: Elliot Gurney, BSN, RN, CWS, Kim RN, BSN Entered By: Elliot Gurney, BSN, RN, CWS, Kim on 07/21/2019 14:05:05 Jonathon York ([id number]) -------------------------------------------------------------------------------- Wound Assessment Details Patient Name: Jonathon York, FORDHAM. Date of Service: 07/21/2019 1:15 PM Medical Record Number: [id number] Patient Account Number: [id number] Date of Birth/Sex: 07/16/1975 (44 y.o. M) Treating RN: Curtis Sites Primary Care Rabab Currington: FITZGERALD, DAVID Other Clinician: Referring Jaice Lague: Alford Highland Treating Azeem Poorman/Extender: Altamese [place] in Treatment: 0 Wound Status Wound Number: 10 Primary Etiology: Pressure Ulcer Wound Location: Sacrum - Midline Wound Status: Open Wounding Event: Pressure Injury Comorbid History: Paraplegia Date Acquired: 04/30/2011 Weeks Of Treatment: 0 Clustered Wound: No Photos Wound Measurements Length: (cm) 3.8 % Re Width: (cm) 6 % Re Depth: (cm) 0.2 Epit Area: (cm) 17.907 Tun Volume: (cm) 3.581 Und duction in Area: duction in Volume: helialization: None neling: No ermining: No Wound Description Classification: Category/Stage III Foul Wound Margin: Epibole Slou Exudate Amount: Medium Exudate Type: Serous Exudate Color: amber Odor  After Cleansing: No gh/Fibrino Yes Wound Bed Granulation Amount: Large (67-100%) Exposed Structure Granulation Quality: Pink Fascia Exposed: No Necrotic Amount: Small (1-33%) Fat Layer (Subcutaneous Tissue) Exposed: Yes Necrotic Quality: Adherent Slough Tendon Exposed: No Muscle Exposed: No Joint Exposed: No Bone Exposed: No Treatment Notes Wound #10 (Midline Sacrum) Notes wet-to-dry Electronic Signature(s) Signed: 07/21/2019 4:33:06 PM By: Colin Benton, Ferdinand Lango ([id number]) Entered By: Curtis Sites on 07/21/2019 13:32:58 Jonathon York ([id number]) -------------------------------------------------------------------------------- Wound Assessment Details Patient Name: Jonathon York. Date of Service: 07/21/2019 1:15 PM Medical Record Number: [id number] Patient Account Number: [id number] Date of Birth/Sex: [birth year] (44 y.o. M) Treating RN: Curtis Sites Primary Care Gwendolyne Welford: FITZGERALD, DAVID Other Clinician: Referring Cherylene Ferrufino: Alford Highland Treating Hula Tasso/Extender: Altamese [place] in Treatment: 0 Wound Status Wound Number: 11 Primary Etiology: Pressure Ulcer Wound Location: Right Sacrum Wound Status: Open Wounding Event: Pressure Injury Comorbid History: Paraplegia Date Acquired: 06/28/2019 Weeks Of Treatment: 0 Clustered Wound: No Photos Wound Measurements Length: (cm) 0.7 Width: (cm) 0.4 Depth: (cm) 0.1 Area: (cm) 0.22 Volume: (cm) 0.022 % Reduction in Area: % Reduction in Volume: Epithelialization: Small (1-33%) Tunneling: No Undermining: No Wound Description Classification: Category/Stage II Fou Wound Margin: Flat and Intact Slo Exudate Amount: Small Exudate Type: Serous Exudate Color: amber l Odor After Cleansing: No ugh/Fibrino Yes Wound Bed Granulation Amount: Large (67-100%) Exposed Structure Granulation Quality: Pink Fascia Exposed: No Necrotic Amount: Small (1-33%) Fat Layer (Subcutaneous Tissue) Exposed:  Yes Necrotic Quality: Adherent Slough Tendon Exposed: No Muscle Exposed: No Joint Exposed: No Bone Exposed: No Treatment Notes Wound #11 (Right Sacrum) Notes wet-to-dry Electronic Signature(s) Signed: 07/21/2019 4:33:06 PM By: Colin Benton, Ferdinand Lango ([id number]) Entered By: Curtis Sites on 07/21/2019 13:34:00 Risdon, Abanoub L. (  [id number]) -------------------------------------------------------------------------------- Wound Assessment Details Patient Name: PEDRAM, GOODCHILD. Date of Service: 07/21/2019 1:15 PM Medical Record Number: [id number] Patient Account Number: [id number] Date of Birth/Sex: 1976-04-02 (44 y.o. M) Treating RN: Montey Hora Primary Care Rande Dario: FITZGERALD, DAVID Other Clinician: Referring Deniss Wormley: Loletha Grayer Treating Alixander Rallis/Extender: Tito Dine in Treatment: 0 Wound Status Wound Number: 12 Primary Etiology: Pressure Ulcer Wound Location: Right Ischium Wound Status: Open Wounding Event: Pressure Injury Comorbid History: Paraplegia Date Acquired: 04/29/2012 Weeks Of Treatment: 0 Clustered Wound: No Photos Wound Measurements Length: (cm) 4.5 % R Width: (cm) 8 % R Depth: (cm) 0.7 Epi Area: (cm) 28.274 Tu Volume: (cm) 19.792 eduction in Area: eduction in Volume: thelialization: None nneling: Yes Position (o'clock): 7 Maximum Distance: (cm) 3.7 Undermining: No Wound Description Classification: Category/Stage III Fou Wound Margin: Flat and Intact Slo Exudate Amount: Medium Exudate Type: Serous Exudate Color: amber l Odor After Cleansing: No ugh/Fibrino Yes Wound Bed Granulation Amount: Large (67-100%) Exposed Structure Granulation Quality: Pink Fascia Exposed: No Necrotic Amount: Small (1-33%) Fat Layer (Subcutaneous Tissue) Exposed: Yes Necrotic Quality: Adherent Slough Tendon Exposed: No Muscle Exposed: No Joint Exposed: No Bone Exposed: No Treatment Notes Wound #12 (Right  Ischium) Notes wet-to-dry ALDO, SONDGEROTH ([id number]) Electronic Signature(s) Signed: 07/21/2019 4:33:06 PM By: Montey Hora Entered By: Montey Hora on 07/21/2019 13:35:22 [name] ([id number]) -------------------------------------------------------------------------------- Wound Assessment Details Patient Name: [name]. Date of Service: 07/21/2019 1:15 PM Medical Record Number: [id number] Patient Account Number: [id number] Date of Birth/Sex: February 01, 1976 (44 y.o. M) Treating RN: Montey Hora Primary Care Lafonda Patron: FITZGERALD, DAVID Other Clinician: Referring Ziah Leandro: Loletha Grayer Treating Odaly Peri/Extender: Tito Dine in Treatment: 0 Wound Status Wound Number: 13 Primary Etiology: Pressure Ulcer Wound Location: Left Ischium Wound Status: Open Wounding Event: Pressure Injury Comorbid History: Paraplegia Date Acquired: 04/29/2013 Weeks Of Treatment: 0 Clustered Wound: No Photos Wound Measurements Length: (cm) 2.4 % R Width: (cm) 5 % R Depth: (cm) 1.3 Epi Area: (cm) 9.425 Tu Volume: (cm) 12.252 Un eduction in Area: eduction in Volume: thelialization: None nneling: No dermining: No Wound Description Classification: Category/Stage III Fou Wound Margin: Flat and Intact Slo Exudate Amount: Medium Exudate Type: Serous Exudate Color: amber l Odor After Cleansing: No ugh/Fibrino Yes Wound Bed Granulation Amount: Large (67-100%) Exposed Structure Granulation Quality: Pink Fascia Exposed: No Necrotic Amount: Small (1-33%) Fat Layer (Subcutaneous Tissue) Exposed: Yes Necrotic Quality: Adherent Slough Tendon Exposed: No Muscle Exposed: No Joint Exposed: No Bone Exposed: No Treatment Notes Wound #13 (Left Ischium) Notes wet-to-dry Electronic Signature(s) Signed: 07/21/2019 4:33:06 PM By: Artis Delay, Janine Ores ([id number]) Entered By: Montey Hora on 07/21/2019 13:36:32 [name]  ([id number]) -------------------------------------------------------------------------------- Vitals Details Patient Name: [name]. Date of Service: 07/21/2019 1:15 PM Medical Record Number: [id number] Patient Account Number: [id number] Date of Birth/Sex: 22-Feb-1976 (44 y.o. M) Treating RN: Montey Hora Primary Care Zoe Nordin: FITZGERALD, DAVID Other Clinician: Referring Corwyn Vora: Loletha Grayer Treating Keenen Roessner/Extender: Tito Dine in Treatment: 0 Vital Signs Time Taken: 13:19 Temperature (F): 98.5 Weight (lbs): 106 Pulse (bpm): 92 Source: Stated Respiratory Rate (breaths/min): 16 Blood Pressure (mmHg): 132/65 Reference Range: 80 - 120 mg / dl Electronic Signature(s) Signed: 07/21/2019 4:33:06 PM By: Montey Hora Entered By: Montey Hora on 07/21/2019 13:19:28

## 2019-08-11 NOTE — Progress Notes (Signed)
JAYDRIEN, WASSENAAR ([id number]) Visit Report for 07/21/2019 Chief Complaint Document Details Patient Name: Jonathon York, Jonathon York. Date of Service: 07/21/2019 1:15 PM Medical Record Number: [id number] Patient Account Number: [id number] Date of Birth/Sex: 30-Sep-1975 (44 y.o. M) Treating RN: Huel Coventry Primary Care Provider: Sampson Goon, DAVID Other Clinician: Referring Provider: Alford Highland Treating Provider/Extender: Altamese [place] in Treatment: 0 Information Obtained from: Patient Chief Complaint Sacral and L ischial pressure ulcers. 07/21/2019; patient is here with sacral and bilateral ischial pressure ulcers Electronic Signature(s) Signed: 07/22/2019 4:13:18 AM By: Baltazar Najjar MD Entered By: Baltazar Najjar on 07/21/2019 14:00:11 [name] ([id number]) -------------------------------------------------------------------------------- HPI Details Patient Name: ANDRIS, BROTHERS. Date of Service: 07/21/2019 1:15 PM Medical Record Number: [id number] Patient Account Number: [id number] Date of Birth/Sex: [birth year] (44 y.o. 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 They have intermittently become smaller/larger over that time. He was previously treated here and was recommended for flap closure of his wounds. Unfortunately, he never underwent those procedures because he continued to smoke and had extremely poor nutrition. He returns for followup today. He has been compliant with his alginate dressings and does have a new air mattress. His MRI did show underlying osteomyelitis at the site of his ischial wound. He would qualify for hyperbaric oxygen therapy  but after submitting his work to insurance is proven to be too expensive for him. 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 He has a chronic Foley catheter. He gives a history of longstanding wounds over the sacrum and bilateral ischial areas. Indeed he was apparently in this clinic several occasions prior to it becoming a [facility]. I can only see 1 of these in 2015 at which time he had a left ischial ulcer. He was in [facility] in 2009 with a pressure ulcer of the sacrum and chronic osteomyelitis of the pelvis. At that point he had wounds on his right foot and required ray amputations. He was recently in hospital from 07/13/2019 through 07/16/2019. Initially admitted with a slight leukocytosis at 15.2 CT scan showed chronic osteomyelitis he was seen by infectious disease who did not feel he needed IV antibiotics but discharged him on Augmentin. He did not feel he needed to be treated for the osteomyelitis noted that he had been previously treated for this a year or 2 ago. He saw general surgery they did not think any debridement was necessary. He has now at home. [facility] has [facility] they are looking for orders. He says he is in his bed more and eating relatively well. He does not want an offloading mattress because it is difficult to transfer. He does have a Roho cushion on his wheelchair where he spends about 8 hours a day. Also of note he apparently was noted to have urinary leakage from the right ischial wound. He has an appointment to discuss this with urology at [facility] sometime in May but Dr. Sampson Goon is trying to move this up for him. Electronic Signature(s) Signed: 07/22/2019 4:13:18 AM By: Baltazar Najjar MD Entered By: Baltazar Najjar on 07/21/2019 14:05:56 [name]  ([id number]) -------------------------------------------------------------------------------- Physical Exam Details Patient Name:  Wolfgang, Kamaron L. Date of Service: 07/21/2019 1:15 PM Medical Record Number: [id number] Patient Account Number: [id number] Date of Birth/Sex: Aug 20, 1975 (43 y.o. M) Treating RN: Huel Coventry Primary Care Provider: Sampson Goon, DAVID Other Clinician: Referring Provider: Alford Highland Treating Provider/Extender: Altamese [place] in Treatment: 0 Constitutional Sitting or standing Blood Pressure is within target range for patient.. Pulse regular and within target range for patient.[name] Respirations regular, non- labored and within target range.. Temperature is normal and within the target range for the patient.[name] appears in no distress. Eyes Conjunctivae clear. No discharge. Respiratory Respiratory effort is easy and symmetric bilaterally. Rate is normal at rest and on room air.. Cardiovascular He does not appear dehydrated.. Gastrointestinal (GI) Abdomen is soft and non-distended without masses or tenderness. Bowel sounds active in all quadrants.. No liver or spleen enlargement or tenderness.. Genitourinary (GU) Foley. Integumentary (Hair, Skin) Absolutely no evidence of infection around any of the wounds. Psychiatric No evidence of depression, anxiety, or agitation. Calm, cooperative, and communicative. Appropriate interactions and affect.. Notes Wound exam oHe has 3 essentially similar wounds over the sacrum, and bilateral ischial areas extending inward towards the perineum just below the anal opening. The there are large but surprisingly superficial at stage III. There is no deep structures oOn the right inferior at roughly 7:00 he has a deep probing area where he says there is a carious generally urinary leakage. He says this is stopped since he had a Foley catheter placed oNo evidence of infection here. Electronic Signature(s) Signed: 07/22/2019  4:13:18 AM By: Baltazar Najjar MD Entered By: Baltazar Najjar on 07/21/2019 14:06:47 [name] ([id number]) -------------------------------------------------------------------------------- Physician Orders Details Patient Name: PREM, COYKENDALL. Date of Service: 07/21/2019 1:15 PM Medical Record Number: [id number] Patient Account Number: [id number] Date of Birth/Sex: [birth year] (44 y.o. M) Treating RN: Huel Coventry Primary Care Provider: Sampson Goon, DAVID Other Clinician: Referring Provider: Alford Highland Treating Provider/Extender: Altamese [place] in Treatment: 0 Verbal / Phone Orders: No Diagnosis Coding Wound Cleansing Wound #10 Midline Sacrum o Clean wound with Normal Saline. o Cleanse wound with mild soap and water Wound #11 Right Sacrum o Clean wound with Normal Saline. o Cleanse wound with mild soap and water Wound #12 Right Ischium o Clean wound with Normal Saline. o Cleanse wound with mild soap and water Wound #13 Left Ischium o Clean wound with Normal Saline. o Cleanse wound with mild soap and water Anesthetic (add to Medication List) Wound #10 Midline Sacrum o Topical Lidocaine 4% cream applied to wound bed prior to debridement (In Clinic Only). Wound #11 Right Sacrum o Topical Lidocaine 4% cream applied to wound bed prior to debridement (In Clinic Only). Wound #12 Right Ischium o Topical Lidocaine 4% cream applied to wound bed prior to debridement (In Clinic Only). Wound #13 Left Ischium o Topical Lidocaine 4% cream applied to wound bed prior to debridement (In Clinic Only). Primary Wound Dressing Wound #10 Midline Sacrum o Saline moistened gauze Wound #11 Right Sacrum o Saline moistened gauze Wound #12 Right Ischium o Saline moistened gauze Wound #13 Left Ischium o Saline moistened gauze Secondary Dressing Wound #10 Midline Sacrum o Other - gauze and abd Wound #11 Right Sacrum o Other - gauze and  abd Wound #12 Right Ischium o Other - gauze and abd Wound #13 Left Ischium o Other - gauze and abd SHAMEER, MOLSTAD. ([id number]) Dressing Change Frequency Wound #10 Midline Sacrum o Change dressing every day. - more often as needed Wound #11 Right Sacrum o Change dressing  every day. - more often as needed Wound #12 Right Ischium o Change dressing every day. - more often as needed Wound #13 Left Ischium o Change dressing every day. - more often as needed Follow-up Appointments Wound #10 Midline Sacrum o Return Appointment in 2 weeks. Wound #11 Right Sacrum o Return Appointment in 2 weeks. Wound #12 Right Ischium o Return Appointment in 2 weeks. Wound #13 Left Ischium o Return Appointment in 2 weeks. Off-Loading Wound #10 Midline Sacrum o Turn and reposition every 2 hours Wound #11 Right Sacrum o Turn and reposition every 2 hours Wound #12 Right Ischium o Turn and reposition every 2 hours Wound #13 Left Ischium o Turn and reposition every 2 hours Additional Orders / Instructions Wound #10 Midline Sacrum o Increase protein intake. Wound #11 Right Sacrum o Increase protein intake. Wound #12 Right Ischium o Increase protein intake. Wound #13 Left Ischium o Increase protein intake. Home Health Wound #10 Midline Sacrum o Continue [facility] Visits - [facility] o Home Health Nurse may visit PRN to address patientos wound care needs. o FACE TO FACE ENCOUNTER: MEDICARE and MEDICAID PATIENTS: I certify that this patient is under my care and that I had a face-to- face encounter that meets the physician face-to-face encounter requirements with this patient on this date. The encounter with the patient was in whole or in part for the following MEDICAL CONDITION: (primary reason for Home Healthcare) MEDICAL NECESSITY: I certify, that based on my findings, NURSING services are a medically necessary home health service. HOME BOUND  STATUS: I certify that my clinical findings support that this patient is homebound (i.e., Due to illness or injury, pt requires aid of supportive devices such as crutches, cane, wheelchairs, walkers, the use of special transportation or the assistance of another person to leave their place of residence. There is a normal inability to leave the home and doing so requires considerable and taxing effort. Other absences are for medical reasons / religious services and are infrequent or of short duration when for other reasons). Winfred LeedsWILSON, Tonie L. ([id number]) o If current dressing causes regression in wound condition, may D/C ordered dressing product/s and apply Normal Saline Moist Dressing daily until next Wound Healing Center / Other MD appointment. Notify Wound Healing Center of regression in wound condition at [phone]. o Please direct any NON-WOUND related issues/requests for orders to patient's Primary Care Physician o Continue Home Health Visits - [facility]-once weekly-please order supplies for patient's daily dressing changes. o Home Health Nurse may visit PRN to address patientos wound care needs. o FACE TO FACE ENCOUNTER: MEDICARE and MEDICAID PATIENTS: I certify that this patient is under my care and that I had a face-to- face encounter that meets the physician face-to-face encounter requirements with this patient on this date. The encounter with the patient was in whole or in part for the following MEDICAL CONDITION: (primary reason for Home Healthcare) MEDICAL NECESSITY: I certify, that based on my findings, NURSING services are a medically necessary home health service. HOME BOUND STATUS: I certify that my clinical findings support that this patient is homebound (i.e., Due to illness or injury, pt requires aid of supportive devices such as crutches, cane, wheelchairs, walkers, the use of special transportation or the assistance of another person to leave their  place of residence. There is a normal inability to leave the home and doing so requires considerable and taxing effort. Other absences are for medical reasons / religious services and are infrequent or  of short duration when for other reasons). o If current dressing causes regression in wound condition, may D/C ordered dressing product/s and apply Normal Saline Moist Dressing daily until next Wound Healing Center / Other MD appointment. Notify Wound Healing Center of regression in wound condition at [phone]. o Please direct any NON-WOUND related issues/requests for orders to patient's Primary Care Physician Wound #11 Right Sacrum o Continue Home Health Visits - [facility]-once weekly-please order supplies for patient's daily dressing changes. o Home Health Nurse may visit PRN to address patientos wound care needs. o FACE TO FACE ENCOUNTER: MEDICARE and MEDICAID PATIENTS: I certify that this patient is under my care and that I had a face-to- face encounter that meets the physician face-to-face encounter requirements with this patient on this date. The encounter with the patient was in whole or in part for the following MEDICAL CONDITION: (primary reason for Home Healthcare) MEDICAL NECESSITY: I certify, that based on my findings, NURSING services are a medically necessary home health service. HOME BOUND STATUS: I certify that my clinical findings support that this patient is homebound (i.e., Due to illness or injury, pt requires aid of supportive devices such as crutches, cane, wheelchairs, walkers, the use of special transportation or the assistance of another person to leave their place of residence. There is a normal inability to leave the home and doing so requires considerable and taxing effort. Other absences are for medical reasons / religious services and are infrequent or of short duration when for other reasons). o If current dressing causes regression in wound  condition, may D/C ordered dressing product/s and apply Normal Saline Moist Dressing daily until next Wound Healing Center / Other MD appointment. Notify Wound Healing Center of regression in wound condition at [phone]. o Please direct any NON-WOUND related issues/requests for orders to patient's Primary Care Physician Wound #12 Right Ischium o Continue Home Health Visits - [facility]-once weekly-please order supplies for patient's daily dressing changes. o Home Health Nurse may visit PRN to address patientos wound care needs. o FACE TO FACE ENCOUNTER: MEDICARE and MEDICAID PATIENTS: I certify that this patient is under my care and that I had a face-to- face encounter that meets the physician face-to-face encounter requirements with this patient on this date. The encounter with the patient was in whole or in part for the following MEDICAL CONDITION: (primary reason for Home Healthcare) MEDICAL NECESSITY: I certify, that based on my findings, NURSING services are a medically necessary home health service. HOME BOUND STATUS: I certify that my clinical findings support that this patient is homebound (i.e., Due to illness or injury, pt requires aid of supportive devices such as crutches, cane, wheelchairs, walkers, the use of special transportation or the assistance of another person to leave their place of residence. There is a normal inability to leave the home and doing so requires considerable and taxing effort. Other absences are for medical reasons / religious services and are infrequent or of short duration when for other reasons). o If current dressing causes regression in wound condition, may D/C ordered dressing product/s and apply Normal Saline Moist Dressing daily until next Wound Healing Center / Other MD appointment. Notify Wound Healing Center of regression in wound condition at [phone]. o Please direct any NON-WOUND related issues/requests for orders to  patient's Primary Care Physician Wound #13 Left Ischium o Continue Home Health Visits - [facility]-once weekly-please order supplies for patient's daily dressing changes. o Home Health Nurse may visit PRN  to address patientos wound care needs. o FACE TO FACE ENCOUNTER: MEDICARE and MEDICAID PATIENTS: I certify that this patient is under my care and that I had a face-to- face encounter that meets the physician face-to-face encounter requirements with this patient on this date. The encounter with the patient was in whole or in part for the following MEDICAL CONDITION: (primary reason for Home Healthcare) MEDICAL NECESSITY: I certify, that based on my findings, NURSING services are a medically necessary home health service. HOME BOUND STATUS: I certify that my clinical findings support that this patient is homebound (i.e., Due to illness or injury, pt requires aid of supportive devices such as crutches, cane, wheelchairs, walkers, the use of special transportation or the assistance of another person to leave their place of residence. There is a normal inability to leave the home and doing so requires considerable and taxing effort. Other absences are for medical reasons / religious services and are infrequent or of short duration when for other reasons). o If current dressing causes regression in wound condition, may D/C ordered dressing product/s and apply Normal Saline Moist Dressing daily until next Wound Healing Center / Other MD appointment. Notify Wound Healing Center of regression in wound condition at [phone]. o Please direct any NON-WOUND related issues/requests for orders to patient's Primary Care Physician Notes Follow-up with urology May 18th. SHAHIR, KAREN ([id number]) Electronic Signature(s) Signed: 07/22/2019 4:13:18 AM By: Baltazar Najjar MD Signed: 08/11/2019 11:11:47 AM By: Elliot Gurney, BSN, RN, CWS, Kim RN, BSN Entered By: Elliot Gurney, BSN, RN, CWS, Kim on  07/21/2019 14:01:38 [name] ([id number]) -------------------------------------------------------------------------------- Problem List Details Patient Name: KALE, RONDEAU. Date of Service: 07/21/2019 1:15 PM Medical Record Number: [id number] Patient Account Number: [id number] Date of Birth/Sex: 1975-12-03 (44 y.o. M) Treating RN: Huel Coventry Primary Care Provider: Sampson Goon, DAVID Other Clinician: Referring Provider: Alford Highland Treating Provider/Extender: Altamese [place] in Treatment: 0 Active Problems ICD-10 Evaluated Encounter Code Description Active Date Today Diagnosis L89.153 Pressure ulcer of sacral region, stage 3 07/21/2019 No Yes L89.313 Pressure ulcer of right buttock, stage 3 07/21/2019 No Yes L89.323 Pressure ulcer of left buttock, stage 3 07/21/2019 No Yes G82.21 Paraplegia, complete 07/21/2019 No Yes Inactive Problems Resolved Problems Electronic Signature(s) Signed: 07/22/2019 4:13:18 AM By: Baltazar Najjar MD Entered By: Baltazar Najjar on 07/21/2019 13:59:25 [name] ([id number]) -------------------------------------------------------------------------------- Progress Note Details Patient Name: [name]. Date of Service: 07/21/2019 1:15 PM Medical Record Number: [id number] Patient Account Number: [id number] Date of Birth/Sex: May 13, 1975 (44 y.o. M) Treating RN: Huel Coventry Primary Care Provider: Sampson Goon, DAVID Other Clinician: Referring Provider: Alford Highland Treating Provider/Extender: Altamese [place] in Treatment: 0 Subjective Chief Complaint Information obtained from Patient Sacral and L ischial pressure ulcers. 07/21/2019; patient is here with sacral and bilateral ischial pressure ulcers History of Present Illness (HPI) 38 paraplegic with a sacral and left ischial ulcers which he has had for over 3 years now (first presented to our clinic on 08/19/13). They have intermittently become smaller/larger over  that time. He was previously treated here and was recommended for flap closure of his wounds. Unfortunately, he never underwent those procedures because he continued to smoke and had extremely poor nutrition. He returns for followup today. He has been compliant with his alginate dressings and does have a new air mattress. His MRI did show underlying osteomyelitis at the site of his ischial wound. He would qualify for hyperbaric oxygen therapy but after submitting his work to insurance is proven to be  too expensive for him. 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 He has a chronic Foley catheter. He gives a history of longstanding wounds over the sacrum and bilateral ischial areas. Indeed he was apparently in this clinic several occasions prior to it becoming a [facility]. I can only see 1 of these in 2015 at which time he had a left ischial ulcer. He was in [facility] in 2009 with a pressure ulcer of the sacrum and chronic osteomyelitis of the pelvis. At that point he had wounds on his right foot and required ray amputations. He was recently in hospital from 07/13/2019 through 07/16/2019. Initially admitted with a slight leukocytosis at 15.2 CT scan showed chronic osteomyelitis he was seen by infectious disease who did not feel he needed IV antibiotics but discharged him on Augmentin. He did not feel he needed to be treated for the osteomyelitis noted that he had been previously treated for this a year or 2 ago. He saw general surgery they did not think any debridement was necessary. He has now at home. [facility] are looking for orders. He says he is in his bed more and eating relatively well. He does not want an offloading mattress because it is difficult to transfer. He does have a Roho cushion on his wheelchair where he spends about 8 hours a day. Also of note he  apparently was noted to have urinary leakage from the right ischial wound. He has an appointment to discuss this with urology at [facility] sometime in May but Dr. Sampson Goon is trying to move this up for him. Patient History Information obtained from Patient. Allergies No Known Allergies Family History Cancer - Maternal Grandparents,Siblings, No family history of Diabetes, Heart Disease, Hereditary Spherocytosis, Hypertension, Kidney Disease, Lung Disease, Seizures, Stroke, Thyroid Problems, Tuberculosis. Social History Former smoker - 2 years, Marital Status - Single, Alcohol Use - Never, Drug Use - No History, Caffeine Use - Moderate - coffee. Medical History Genitourinary Denies history of End Stage Renal Disease Integumentary (Skin) Denies history of History of Burn, History of pressure wounds Neurologic Patient has history of Paraplegia Denies history of Dementia, Neuropathy, Quadriplegia, Seizure Disorder Medical And Surgical History Notes ENDRE, COUTTS ([id number]) Genitourinary foley in place Review of Systems (ROS) Constitutional Symptoms (General Health) Denies complaints or symptoms of Fatigue, Fever, Chills, Marked Weight Change. Eyes Denies complaints or symptoms of Dry Eyes, Vision Changes, Glasses / Contacts. Ear/Nose/Mouth/Throat Denies complaints or symptoms of Difficult clearing ears, Sinusitis. Hematologic/Lymphatic Denies complaints or symptoms of Bleeding / Clotting Disorders, Human Immunodeficiency Virus. Respiratory Denies complaints or symptoms of Chronic or frequent coughs, Shortness of Breath. Cardiovascular Denies complaints or symptoms of Chest pain, LE edema. Gastrointestinal Denies complaints or symptoms of Frequent diarrhea, Nausea, Vomiting. Endocrine Denies complaints or symptoms of Hepatitis, Thyroid disease, Polydypsia (Excessive Thirst). Genitourinary Denies complaints or symptoms of Kidney failure/ Dialysis,  Incontinence/dribbling. Immunological Denies complaints or symptoms of Hives, Itching. Integumentary (Skin) Complains or has symptoms of Wounds. Denies complaints or symptoms of Bleeding or bruising tendency, Breakdown, Swelling. Musculoskeletal Denies complaints or symptoms of Muscle Pain, Muscle Weakness. Neurologic Denies complaints or symptoms of Numbness/parasthesias, Focal/Weakness. Psychiatric Denies complaints or symptoms of Anxiety, Claustrophobia. Objective Constitutional Sitting or standing Blood Pressure is within target range for patient.. Pulse regular and within target range for patient.[name] Respirations regular, non- labored and within target range.. Temperature is normal and  within the target range for the patient.[name] appears in no distress. Vitals Time Taken: 1:19 PM, Weight: 106 lbs, Source: Stated, Temperature: 98.5 F, Pulse: 92 bpm, Respiratory Rate: 16 breaths/min, Blood Pressure: 132/65 mmHg. Eyes Conjunctivae clear. No discharge. Respiratory Respiratory effort is easy and symmetric bilaterally. Rate is normal at rest and on room air.. Cardiovascular He does not appear dehydrated.. Gastrointestinal (GI) Abdomen is soft and non-distended without masses or tenderness. Bowel sounds active in all quadrants.. No liver or spleen enlargement or tenderness.. Genitourinary (GU) Foley. Psychiatric No evidence of depression, anxiety, or agitation. Calm, cooperative, and communicative. Appropriate interactions and affect.Andrey Campanile, Ferdinand Lango ([id number]) General Notes: Wound exam He has 3 essentially similar wounds over the sacrum, and bilateral ischial areas extending inward towards the perineum just below the anal opening. The there are large but surprisingly superficial at stage III. There is no deep structures On the right inferior at roughly 7:00 he has a deep probing area where he says there is a carious generally urinary leakage. He says this is stopped since he had a  Foley catheter placed No evidence of infection here. Integumentary (Hair, Skin) Absolutely no evidence of infection around any of the wounds. Wound #10 status is Open. Original cause of wound was Pressure Injury. The wound is located on the Midline Sacrum. The wound measures 3.8cm length x 6cm width x 0.2cm depth; 17.907cm^2 area and 3.581cm^3 volume. There is Fat Layer (Subcutaneous Tissue) Exposed exposed. There is no tunneling or undermining noted. There is a medium amount of serous drainage noted. The wound margin is epibole. There is large (67-100%) pink granulation within the wound bed. There is a small (1-33%) amount of necrotic tissue within the wound bed including Adherent Slough. Wound #11 status is Open. Original cause of wound was Pressure Injury. The wound is located on the Right Sacrum. The wound measures 0.7cm length x 0.4cm width x 0.1cm depth; 0.22cm^2 area and 0.022cm^3 volume. There is Fat Layer (Subcutaneous Tissue) Exposed exposed. There is no tunneling or undermining noted. There is a small amount of serous drainage noted. The wound margin is flat and intact. There is large (67-100%) pink granulation within the wound bed. There is a small (1-33%) amount of necrotic tissue within the wound bed including Adherent Slough. Wound #12 status is Open. Original cause of wound was Pressure Injury. The wound is located on the Right Ischium. The wound measures 4.5cm length x 8cm width x 0.7cm depth; 28.274cm^2 area and 19.792cm^3 volume. There is Fat Layer (Subcutaneous Tissue) Exposed exposed. There is no undermining noted, however, there is tunneling at 7:00 with a maximum distance of 3.7cm. There is a medium amount of serous drainage noted. The wound margin is flat and intact. There is large (67-100%) pink granulation within the wound bed. There is a small (1-33%) amount of necrotic tissue within the wound bed including Adherent Slough. Wound #13 status is Open. Original cause of wound  was Pressure Injury. The wound is located on the Left Ischium. The wound measures 2.4cm length x 5cm width x 1.3cm depth; 9.425cm^2 area and 12.252cm^3 volume. There is Fat Layer (Subcutaneous Tissue) Exposed exposed. There is no tunneling or undermining noted. There is a medium amount of serous drainage noted. The wound margin is flat and intact. There is large (67-100%) pink granulation within the wound bed. There is a small (1-33%) amount of necrotic tissue within the wound bed including Adherent Slough. Assessment Active Problems ICD-10 Pressure ulcer of sacral region, stage 3 Pressure ulcer  of right buttock, stage 3 Pressure ulcer of left buttock, stage 3 Paraplegia, complete Plan Wound Cleansing: Wound #10 Midline Sacrum: Clean wound with Normal Saline. Cleanse wound with mild soap and water Wound #11 Right Sacrum: Clean wound with Normal Saline. Cleanse wound with mild soap and water Wound #12 Right Ischium: Clean wound with Normal Saline. Cleanse wound with mild soap and water Wound #13 Left Ischium: Clean wound with Normal Saline. Cleanse wound with mild soap and water Anesthetic (add to Medication List): Wound #10 Midline Sacrum: Topical Lidocaine 4% cream applied to wound bed prior to debridement (In Clinic Only). Wound #11 Right Sacrum: Topical Lidocaine 4% cream applied to wound bed prior to debridement (In Clinic Only). Wound #12 Right Ischium: Topical Lidocaine 4% cream applied to wound bed prior to debridement (In Clinic Only). Wound #13 Left Ischium: KEALAN, BUCHAN ([id number]) Topical Lidocaine 4% cream applied to wound bed prior to debridement (In Clinic Only). Primary Wound Dressing: Wound #10 Midline Sacrum: Saline moistened gauze Wound #11 Right Sacrum: Saline moistened gauze Wound #12 Right Ischium: Saline moistened gauze Wound #13 Left Ischium: Saline moistened gauze Secondary Dressing: Wound #10 Midline Sacrum: Other - gauze and abd Wound #11  Right Sacrum: Other - gauze and abd Wound #12 Right Ischium: Other - gauze and abd Wound #13 Left Ischium: Other - gauze and abd Dressing Change Frequency: Wound #10 Midline Sacrum: Change dressing every day. - more often as needed Wound #11 Right Sacrum: Change dressing every day. - more often as needed Wound #12 Right Ischium: Change dressing every day. - more often as needed Wound #13 Left Ischium: Change dressing every day. - more often as needed Follow-up Appointments: Wound #10 Midline Sacrum: Return Appointment in 2 weeks. Wound #11 Right Sacrum: Return Appointment in 2 weeks. Wound #12 Right Ischium: Return Appointment in 2 weeks. Wound #13 Left Ischium: Return Appointment in 2 weeks. Off-Loading: Wound #10 Midline Sacrum: Turn and reposition every 2 hours Wound #11 Right Sacrum: Turn and reposition every 2 hours Wound #12 Right Ischium: Turn and reposition every 2 hours Wound #13 Left Ischium: Turn and reposition every 2 hours Additional Orders / Instructions: Wound #10 Midline Sacrum: Increase protein intake. Wound #11 Right Sacrum: Increase protein intake. Wound #12 Right Ischium: Increase protein intake. Wound #13 Left Ischium: Increase protein intake. Home Health: Wound #10 Midline Sacrum: Continue Home Health Visits - [facility] Home Health Nurse may visit PRN to address patient s wound care needs. FACE TO FACE ENCOUNTER: MEDICARE and MEDICAID PATIENTS: I certify that this patient is under my care and that I had a face-to-face encounter that meets the physician face-to-face encounter requirements with this patient on this date. The encounter with the patient was in whole or in part for the following MEDICAL CONDITION: (primary reason for Home Healthcare) MEDICAL NECESSITY: I certify, that based on my findings, NURSING services are a medically necessary home health service. HOME BOUND STATUS: I certify that my clinical findings support that this  patient is homebound (i.e., Due to illness or injury, pt requires aid of supportive devices such as crutches, cane, wheelchairs, walkers, the use of special transportation or the assistance of another person to leave their place of residence. There is a normal inability to leave the home and doing so requires considerable and taxing effort. Other absences are for medical reasons / religious services and are infrequent or of short duration when for other reasons). If current dressing causes regression in wound condition, may D/C ordered  dressing product/s and apply Normal Saline Moist Dressing daily until next Wound Healing Center / Other MD appointment. Notify Wound Healing Center of regression in wound condition at [phone]. Please direct any NON-WOUND related issues/requests for orders to patient's Primary Care Physician Continue Home Health Visits - [facility]-once weekly-please order supplies for patient's daily dressing changes. Home Health Nurse may visit PRN to address patient s wound care needs. LINKYN, GOBIN ([id number]) FACE TO FACE ENCOUNTER: MEDICARE and MEDICAID PATIENTS: I certify that this patient is under my care and that I had a face-to-face encounter that meets the physician face-to-face encounter requirements with this patient on this date. The encounter with the patient was in whole or in part for the following MEDICAL CONDITION: (primary reason for Home Healthcare) MEDICAL NECESSITY: I certify, that based on my findings, NURSING services are a medically necessary home health service. HOME BOUND STATUS: I certify that my clinical findings support that this patient is homebound (i.e., Due to illness or injury, pt requires aid of supportive devices such as crutches, cane, wheelchairs, walkers, the use of special transportation or the assistance of another person to leave their place of residence. There is a normal inability to leave the home and doing so  requires considerable and taxing effort. Other absences are for medical reasons / religious services and are infrequent or of short duration when for other reasons). If current dressing causes regression in wound condition, may D/C ordered dressing product/s and apply Normal Saline Moist Dressing daily until next Wound Healing Center / Other MD appointment. Notify Wound Healing Center of regression in wound condition at [phone]. Please direct any NON-WOUND related issues/requests for orders to patient's Primary Care Physician Wound #11 Right Sacrum: Continue Home Health Visits - [facility]-once weekly-please order supplies for patient's daily dressing changes. Home Health Nurse may visit PRN to address patient s wound care needs. FACE TO FACE ENCOUNTER: MEDICARE and MEDICAID PATIENTS: I certify that this patient is under my care and that I had a face-to-face encounter that meets the physician face-to-face encounter requirements with this patient on this date. The encounter with the patient was in whole or in part for the following MEDICAL CONDITION: (primary reason for Home Healthcare) MEDICAL NECESSITY: I certify, that based on my findings, NURSING services are a medically necessary home health service. HOME BOUND STATUS: I certify that my clinical findings support that this patient is homebound (i.e., Due to illness or injury, pt requires aid of supportive devices such as crutches, cane, wheelchairs, walkers, the use of special transportation or the assistance of another person to leave their place of residence. There is a normal inability to leave the home and doing so requires considerable and taxing effort. Other absences are for medical reasons / religious services and are infrequent or of short duration when for other reasons). If current dressing causes regression in wound condition, may D/C ordered dressing product/s and apply Normal Saline Moist Dressing daily until  next Wound Healing Center / Other MD appointment. Notify Wound Healing Center of regression in wound condition at [phone]. Please direct any NON-WOUND related issues/requests for orders to patient's Primary Care Physician Wound #12 Right Ischium: Continue Home Health Visits - [facility]-once weekly-please order supplies for patient's daily dressing changes. Home Health Nurse may visit PRN to address patient s wound care needs. FACE TO FACE ENCOUNTER: MEDICARE and MEDICAID PATIENTS: I certify that this patient is under my care and that I had a face-to-face encounter that  meets the physician face-to-face encounter requirements with this patient on this date. The encounter with the patient was in whole or in part for the following MEDICAL CONDITION: (primary reason for [place]) MEDICAL NECESSITY: I certify, that based on my findings, NURSING services are a medically necessary home health service. HOME BOUND STATUS: I certify that my clinical findings support that this patient is homebound (i.e., Due to illness or injury, pt requires aid of supportive devices such as crutches, cane, wheelchairs, walkers, the use of special transportation or the assistance of another person to leave their place of residence. There is a normal inability to leave the home and doing so requires considerable and taxing effort. Other absences are for medical reasons / religious services and are infrequent or of short duration when for other reasons). If current dressing causes regression in wound condition, may D/C ordered dressing product/s and apply Normal Saline Moist Dressing daily until next [place] / Other MD appointment. [place] of regression in wound condition at [phone]. Please direct any NON-WOUND related issues/requests for orders to patient's Primary Care Physician Wound #13 Left Ischium: [place]  weekly-please order supplies for patient's daily dressing changes. Home Health Nurse may visit PRN to address patient s wound care needs. FACE TO FACE ENCOUNTER: MEDICARE and MEDICAID PATIENTS: I certify that this patient is under my care and that I had a face-to-face encounter that meets the physician face-to-face encounter requirements with this patient on this date. The encounter with the patient was in whole or in part for the following MEDICAL CONDITION: (primary reason for [place]) MEDICAL NECESSITY: I certify, that based on my findings, NURSING services are a medically necessary home health service. HOME BOUND STATUS: I certify that my clinical findings support that this patient is homebound (i.e., Due to illness or injury, pt requires aid of supportive devices such as crutches, cane, wheelchairs, walkers, the use of special transportation or the assistance of another person to leave their place of residence. There is a normal inability to leave the home and doing so requires considerable and taxing effort. Other absences are for medical reasons / religious services and are infrequent or of short duration when for other reasons). If current dressing causes regression in wound condition, may D/C ordered dressing product/s and apply Normal Saline Moist Dressing daily until next [place] / Other MD appointment. [place] of regression in wound condition at [phone]. Please direct any NON-WOUND related issues/requests for orders to patient's Primary Care Physician General Notes: Follow-up with urology May 18th. 1. At this point I could not see anything else that made sense other than a normal saline wet-to-dry with ABD pads over this area. This is what he is already doing. Although I could imagine doing a more aggressive things to this area it would really mean that he would need to stay off these wounds much more than he is able or willing to do. These  are chronic wounds and I am doubtful that they will heal although we will follow him serially. I will see him again in 2 weeks 2. 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 3. He apparently is still on Augmentin although Dr. Ola Spurr did not think he needed IV antibiotics for the underlying chronic osteomyelitis. He had a recent CT scan during last week's hospitalization 4. He is chronically listed  is malnourished because he is a relatively small man and does not weigh that much however his albumin in the hospital was 4.3 I spent 35 minutes in review of this patient's record face-to-face evaluation and preparation of this note NOMAR, BROAD ([id number]) Electronic Signature(s) Signed: 07/22/2019 4:13:18 AM By: Baltazar Najjar MD Entered By: Baltazar Najjar on 07/21/2019 14:09:16 [name] ([id number]) -------------------------------------------------------------------------------- ROS/PFSH Details Patient Name: [name]. Date of Service: 07/21/2019 1:15 PM Medical Record Number: [id number] Patient Account Number: [id number] Date of Birth/Sex: [birth year] (44 y.o. M) Treating RN: Curtis Sites Primary Care Provider: Sampson Goon, DAVID Other Clinician: Referring Provider: Alford Highland Treating Provider/Extender: Altamese [place] in Treatment: 0 Label Progress Note Print Version as History and Physical for this encounter Information Obtained From Patient Constitutional Symptoms (General Health) Complaints and Symptoms: Negative for: Fatigue; Fever; Chills; Marked Weight Change Eyes Complaints and Symptoms: Negative for: Dry Eyes; Vision Changes; Glasses / Contacts Ear/Nose/Mouth/Throat Complaints and Symptoms: Negative for: Difficult clearing ears; Sinusitis Hematologic/Lymphatic Complaints and Symptoms: Negative for: Bleeding / Clotting Disorders; Human Immunodeficiency  Virus Respiratory Complaints and Symptoms: Negative for: Chronic or frequent coughs; Shortness of Breath Cardiovascular Complaints and Symptoms: Negative for: Chest pain; LE edema Gastrointestinal Complaints and Symptoms: Negative for: Frequent diarrhea; Nausea; Vomiting Endocrine Complaints and Symptoms: Negative for: Hepatitis; Thyroid disease; Polydypsia (Excessive Thirst) Genitourinary Complaints and Symptoms: Negative for: Kidney failure/ Dialysis; Incontinence/dribbling Medical History: Negative for: End Stage Renal Disease Past Medical History Notes: foley in place Immunological Complaints and Symptoms: Negative for: Hives; Itching MART, COLPITTS ([id number]) Integumentary (Skin) Complaints and Symptoms: Positive for: Wounds Negative for: Bleeding or bruising tendency; Breakdown; Swelling Medical History: Negative for: History of Burn; History of pressure wounds Musculoskeletal Complaints and Symptoms: Negative for: Muscle Pain; Muscle Weakness Neurologic Complaints and Symptoms: Negative for: Numbness/parasthesias; Focal/Weakness Medical History: Positive for: Paraplegia Negative for: Dementia; Neuropathy; Quadriplegia; Seizure Disorder Psychiatric Complaints and Symptoms: Negative for: Anxiety; Claustrophobia Oncologic Immunizations Pneumococcal Vaccine: Received Pneumococcal Vaccination: No Immunization Notes: up to date Implantable Devices None Family and Social History Cancer: Yes - Maternal Grandparents,Siblings; Diabetes: No; Heart Disease: No; Hereditary Spherocytosis: No; Hypertension: No; Kidney Disease: No; Lung Disease: No; Seizures: No; Stroke: No; Thyroid Problems: No; Tuberculosis: No; Former smoker - 2 years; Marital Status - Single; Alcohol Use: Never; Drug Use: No History; Caffeine Use: Moderate - coffee; Financial Concerns: No; Food, Clothing or Shelter Needs: No; Support System Lacking: No; Transportation Concerns: No Electronic  Signature(s) Signed: 07/21/2019 4:33:06 PM By: Curtis Sites Signed: 07/22/2019 4:13:18 AM By: Baltazar Najjar MD Entered By: Curtis Sites on 07/21/2019 13:17:20 [name] ([id number]) -------------------------------------------------------------------------------- SuperBill Details Patient Name: ALCARIO, TINKEY. Date of Service: 07/21/2019 Medical Record Number: [id number] Patient Account Number: [id number] Date of Birth/Sex: [birth year] (44 y.o. M) Treating RN: Huel [place] Primary Care Provider: Sampson Goon, DAVID Other Clinician: Referring Provider: Alford Highland Treating Provider/Extender: Altamese [place] in Treatment: 0 Diagnosis Coding ICD-10 Codes Code Description L89.153 Pressure ulcer of sacral region, stage 3 L89.313 Pressure ulcer of right buttock, stage 3 L89.323 Pressure ulcer of left buttock, stage 3 G82.21 Paraplegia, complete Facility Procedures CPT4 Code: [id number] Description: 63845 - WOUND CARE VISIT-LEV 5 EST PT Modifier: Quantity: 1 Physician Procedures CPT4 Code: [id number] Description: WC PHYS LEVEL 3 o NEW PT Modifier: Quantity: 1 CPT4 Code: Description: ICD-10 Diagnosis Description L89.153 Pressure ulcer of sacral region, stage 3 L89.313 Pressure ulcer of right buttock, stage 3 L89.323 Pressure ulcer of left buttock, stage 3 G82.21 Paraplegia, complete Modifier:  Quantity: Electronic Signature(s) Signed: 07/22/2019 4:13:18 AM By: Baltazar Najjar MD Entered By: Baltazar Najjar on 07/21/2019 14:09:51

## 2019-08-12 NOTE — Progress Notes (Signed)
TOBE, KERVIN ([id number]) Visit Report for 08/11/2019 Arrival Information Details Patient Name: BRACH, BIRDSALL. Date of Service: 08/11/2019 1:30 PM Medical Record Number: [id number] Patient Account Number: [id number] Date of Birth/Sex: 08-10-1975 (44 y.o. M) Treating RN: Huel Coventry Primary Care Detrich Rakestraw: FITZGERALD, DAVID Other Clinician: Referring Jaylin Roundy: FITZGERALD, DAVID Treating Raenette Sakata/Extender: Altamese [place] in Treatment: 3 Visit Information History Since Last Visit Added or deleted any medications: No Patient Arrived: Wheel Chair Any new allergies or adverse reactions: No Arrival Time: 13:25 Had a fall or experienced change in No Accompanied By: self activities of daily living that may affect Transfer Assistance: None risk of falls: Patient Identification Verified: Yes Signs or symptoms of abuse/neglect since last visito No Secondary Verification Process Completed: Yes Hospitalized since last visit: No Implantable device outside of the clinic excluding No cellular tissue based products placed in the center since last visit: Has Dressing in Place as Prescribed: Yes Pain Present Now: Yes Electronic Signature(s) Signed: 08/11/2019 3:16:46 PM By: Dayton Martes RCP, RRT, CHT Entered By: Weyman Rodney, Lucio Edward on 08/11/2019 13:27:54 [name] ([id number]) -------------------------------------------------------------------------------- Clinic Level of Care Assessment Details Patient Name: BERISH, BOHMAN L. Date of Service: 08/11/2019 1:30 PM Medical Record Number: [id number] Patient Account Number: [id number] Date of Birth/Sex: 1976/02/21 (44 y.o. M) Treating RN: Rodell Perna Primary Care Devyne Hauger: FITZGERALD, DAVID Other Clinician: Referring Kaitrin Seybold: FITZGERALD, DAVID Treating Jden Want/Extender: Altamese [place] in Treatment: 3 Clinic Level of Care Assessment Items TOOL 4 Quantity Score []  - Use when only an EandM is  performed on FOLLOW-UP visit 0 ASSESSMENTS - Nursing Assessment / Reassessment X - Reassessment of Co-morbidities (includes updates in patient status) 1 10 X- 1 5 Reassessment of Adherence to Treatment Plan ASSESSMENTS - Wound and Skin Assessment / Reassessment []  - Simple Wound Assessment / Reassessment - one wound 0 X- 4 5 Complex Wound Assessment / Reassessment - multiple wounds []  - 0 Dermatologic / Skin Assessment (not related to wound area) ASSESSMENTS - Focused Assessment []  - Circumferential Edema Measurements - multi extremities 0 []  - 0 Nutritional Assessment / Counseling / Intervention []  - 0 Lower Extremity Assessment (monofilament, tuning fork, pulses) []  - 0 Peripheral Arterial Disease Assessment (using hand held doppler) ASSESSMENTS - Ostomy and/or Continence Assessment and Care []  - Incontinence Assessment and Management 0 []  - 0 Ostomy Care Assessment and Management (repouching, etc.) PROCESS - Coordination of Care X - Simple Patient / Family Education for ongoing care 1 15 []  - 0 Complex (extensive) Patient / Family Education for ongoing care X- 1 10 Staff obtains , Records, Test Results / Process Orders []  - 0 Staff telephones HHA, Nursing Homes / Clarify orders / etc []  - 0 Routine Transfer to another Facility (non-emergent condition) []  - 0 Routine Hospital Admission (non-emergent condition) []  - 0 New Admissions / / Ordering NPWT, Apligraf, etc. []  - 0 Emergency Hospital Admission (emergent condition) X- 1 10 Simple Discharge Coordination []  - 0 Complex (extensive) Discharge Coordination PROCESS - Special Needs []  - Pediatric / Minor Patient Management 0 []  - 0 Isolation Patient Management []  - 0 Hearing / Language / Visual special needs []  - 0 Assessment of Community assistance (transportation, D/C planning, etc.) OPIE, MACLAUGHLIN ( ) []  - 0 Additional assistance / Altered mentation []  - 0 Support  Surface(s) Assessment (bed, cushion, seat, etc.) INTERVENTIONS - Wound Cleansing / Measurement []  - Simple Wound Cleansing - one wound 0 X- 4 5 Complex Wound Cleansing - multiple wounds X-  1 5 Wound Imaging (photographs - any number of wounds) []  - 0 Wound Tracing (instead of photographs) []  - 0 Simple Wound Measurement - one wound X- 4 5 Complex Wound Measurement - multiple wounds INTERVENTIONS - Wound Dressings []  - Small Wound Dressing one or multiple wounds 0 X- 4 15 Medium Wound Dressing one or multiple wounds []  - 0 Large Wound Dressing one or multiple wounds []  - 0 Application of Medications - topical []  - 0 Application of Medications - injection INTERVENTIONS - Miscellaneous []  - External ear exam 0 []  - 0 Specimen Collection (cultures, biopsies, blood, body fluids, etc.) []  - 0 Specimen(s) / Culture(s) sent or taken to Lab for analysis []  - 0 Patient Transfer (multiple staff / Civil Service fast streamer / Similar devices) []  - 0 Simple Staple / Suture removal (25 or less) []  - 0 Complex Staple / Suture removal (26 or more) []  - 0 Hypo / Hyperglycemic Management (close monitor of Blood Glucose) []  - 0 Ankle / Brachial Index (ABI) - do not check if billed separately X- 1 5 Vital Signs Has the patient been seen at the hospital within the last three years: Yes Total Score: 180 Level Of Care: New/Established - Level 5 Electronic Signature(s) Signed: 08/12/2019 9:44:09 AM By: Army Melia Entered By: Army Melia on 08/11/2019 14:17:07 [name] ([id number]) -------------------------------------------------------------------------------- Encounter Discharge Information Details Patient Name: [name]. Date of Service: 08/11/2019 1:30 PM Medical Record Number: [id number] Patient Account Number: [id number] Date of Birth/Sex: 1975/10/16 (43 y.o. M) Treating RN: Cornell Barman Primary Care Abryanna Musolino: FITZGERALD, DAVID Other Clinician: Referring Alphonsa Brickle: FITZGERALD,  DAVID Treating Lakeem Rozo/Extender: Tito Dine in Treatment: 3 Encounter Discharge Information Items Discharge Condition: Stable Ambulatory Status: Ambulatory Discharge Destination: Home Transportation: Private Auto Accompanied By: self Schedule Follow-up Appointment: Yes Clinical Summary of Care: Electronic Signature(s) Signed: 08/11/2019 3:38:44 PM By: Gretta Cool, BSN, RN, CWS, Kim RN, BSN Entered By: Gretta Cool, BSN, RN, CWS, Kim on 08/11/2019 15:38:44 [name] ([id number]) -------------------------------------------------------------------------------- Lower Extremity Assessment Details Patient Name: RONOLD, HARDGROVE. Date of Service: 08/11/2019 1:30 PM Medical Record Number: [id number] Patient Account Number: [id number] Date of Birth/Sex: April 03, 1976 (44 y.o. M) Treating RN: Army Melia Primary Care Charrise Lardner: FITZGERALD, DAVID Other Clinician: Referring Merle Whitehorn: FITZGERALD, DAVID Treating Isamu Trammel/Extender: Ricard Dillon Weeks in Treatment: 3 Electronic Signature(s) Signed: 08/12/2019 9:44:09 AM By: Army Melia Entered By: Army Melia on 08/11/2019 13:52:12 [name] ([id number]) -------------------------------------------------------------------------------- Multi Wound Chart Details Patient Name: [name]. Date of Service: 08/11/2019 1:30 PM Medical Record Number: [id number] Patient Account Number: [id number] Date of Birth/Sex: [birth year] (44 y.o. M) Treating RN: Army Melia Primary Care Cesar Alf: FITZGERALD, DAVID Other Clinician: Referring Ivianna Notch: FITZGERALD, DAVID Treating Kushal Saunders/Extender: Ricard Dillon Weeks in Treatment: 3 Vital Signs Height(in): Pulse(bpm): 85 Weight(lbs): 106 Blood Pressure(mmHg): 128/75 Body Mass Index(BMI): Temperature(F): 98.2 Respiratory Rate(breaths/min): 16 Photos: Wound Location: Midline Sacrum Right Sacrum Right Ischium Wounding Event: Pressure Injury Pressure Injury Pressure  Injury Primary Etiology: Pressure Ulcer Pressure Ulcer Pressure Ulcer Comorbid History: Paraplegia Paraplegia Paraplegia Date Acquired: 04/30/2011 06/28/2019 04/29/2012 Weeks of Treatment: 3 3 3  Wound Status: Open Open Open Measurements L x W x D (cm) 3x6.1x0.2 0x0x0 4x5.7x2.7 Area (cm) : 14.373 0 17.907 Volume (cm) : 2.875 0 48.349 % Reduction in Area: 19.70% 100.00% 36.70% % Reduction in Volume: 19.70% 100.00% -144.30% Classification: Category/Stage III Category/Stage II Category/Stage III Exudate Amount: Medium None Present Medium Exudate Type: Serous N/A Serous Exudate Color: amber N/A amber Wound Margin: Epibole Flat and  Intact Flat and Intact Granulation Amount: Large (67-100%) None Present (0%) Large (67-100%) Granulation Quality: Pink N/A Pink Necrotic Amount: Small (1-33%) None Present (0%) Small (1-33%) Exposed Structures: Fat Layer (Subcutaneous Tissue) Fascia: No Fat Layer (Subcutaneous Tissue) Exposed: Yes Fat Layer (Subcutaneous Tissue) Exposed: Yes Fascia: No Exposed: No Fascia: No Tendon: No Tendon: No Tendon: No Muscle: No Muscle: No Muscle: No Joint: No Joint: No Joint: No Bone: No Bone: No Bone: No Epithelialization: None Large (67-100%) None Wound Number: 13 N/A N/A Photos: N/A N/A SLAYTON, LUBITZ ([id number]) Wound Location: Left Ischium N/A N/A Wounding Event: Pressure Injury N/A N/A Primary Etiology: Pressure Ulcer N/A N/A Comorbid History: Paraplegia N/A N/A Date Acquired: 04/29/2013 N/A N/A Weeks of Treatment: 3 N/A N/A Wound Status: Open N/A N/A Measurements L x W x D (cm) 2.5x6.5x1.2 N/A N/A Area (cm) : 12.763 N/A N/A Volume (cm) : 15.315 N/A N/A % Reduction in Area: -35.40% N/A N/A % Reduction in Volume: -25.00% N/A N/A Classification: Category/Stage III N/A N/A Exudate Amount: Medium N/A N/A Exudate Type: Serous N/A N/A Exudate Color: amber N/A N/A Wound Margin: Flat and Intact N/A N/A Granulation Amount: Large (67-100%) N/A  N/A Granulation Quality: Pink N/A N/A Necrotic Amount: Small (1-33%) N/A N/A Exposed Structures: Fat Layer (Subcutaneous Tissue) N/A N/A Exposed: Yes Fascia: No Tendon: No Muscle: No Joint: No Bone: No Epithelialization: None N/A N/A Treatment Notes Electronic Signature(s) Signed: 08/11/2019 4:08:23 PM By: Baltazar Najjar MD Entered By: Baltazar Najjar on 08/11/2019 15:14:44 [name] ([id number]) -------------------------------------------------------------------------------- Multi-Disciplinary Care Plan Details Patient Name: KAISER, BELLUOMINI L. Date of Service: 08/11/2019 1:30 PM Medical Record Number: [id number] Patient Account Number: [id number] Date of Birth/Sex: 1976/03/20 (44 y.o. M) Treating RN: Rodell Perna Primary Care Imya Mance: FITZGERALD, DAVID Other Clinician: Referring Reagen Haberman: FITZGERALD, DAVID Treating Sheela Mcculley/Extender: Altamese Dawes in Treatment: 3 Active Inactive Orientation to the Wound Care Program Nursing Diagnoses: Knowledge deficit related to the wound healing center program Goals: Patient/caregiver will verbalize understanding of the Wound Healing Center Program Date Initiated: 07/21/2019 Target Resolution Date: 07/21/2019 Goal Status: Active Interventions: Provide education on orientation to the wound center Notes: Pressure Nursing Diagnoses: Knowledge deficit related to management of pressures ulcers Potential for impaired tissue integrity related to pressure, friction, moisture, and shear Goals: Patient/caregiver will verbalize understanding of pressure ulcer management Date Initiated: 07/21/2019 Target Resolution Date: 07/28/2019 Goal Status: Active Interventions: Assess: immobility, friction, shearing, incontinence upon admission and as needed Assess offloading mechanisms upon admission and as needed Notes: Wound/Skin Impairment Nursing Diagnoses: Impaired tissue integrity Goals: Ulcer/skin breakdown will have a volume  reduction of 30% by week 4 Date Initiated: 07/21/2019 Target Resolution Date: 08/21/2019 Goal Status: Active Interventions: Assess ulceration(s) every visit Treatment Activities: Skin care regimen initiated : 07/21/2019 Notes: JEFFEREY, LIPPMANN ([id number]) Electronic Signature(s) Signed: 08/12/2019 9:44:09 AM By: Rodell Perna Entered By: Rodell Perna on 08/11/2019 14:14:48 [name] ([id number]) -------------------------------------------------------------------------------- Pain Assessment Details Patient Name: [name]. Date of Service: 08/11/2019 1:30 PM Medical Record Number: [id number] Patient Account Number: [id number] Date of Birth/Sex: 04/14/1976 (44 y.o. M) Treating RN: Rodell Perna Primary Care Alyzah Pelly: FITZGERALD, DAVID Other Clinician: Referring Vermell Madrid: FITZGERALD, DAVID Treating Anahla Bevis/Extender: Maxwell Caul Weeks in Treatment: 3 Active Problems Location of Pain Severity and Description of Pain Patient Has Paino Yes Site Locations Pain Location: Pain in Ulcers Rate the pain. Current Pain Level: 6 Pain Management and Medication Current Pain Management: Electronic Signature(s) Signed: 08/12/2019 9:44:09 AM By: Rodell Perna Entered By: Rodell Perna on  08/11/2019 13:43:47 Winfred LeedsWILSON, Sylvio L. ([id number]) -------------------------------------------------------------------------------- Patient/Caregiver Education Details Patient Name: Winfred LeedsWILSON, Nihar L. Date of Service: 08/11/2019 1:30 PM Medical Record Number: [id number] Patient Account Number: [id number] Date of Birth/Gender: 02-22-1976 (44 y.o. M) Treating RN: Rodell PernaScott, Dajea Primary Care Physician: FITZGERALD, DAVID Other Clinician: Referring Physician: FITZGERALD, DAVID Treating Physician/Extender: Altamese CarolinaOBSON, MICHAEL G Weeks in Treatment: 3 Education Assessment Education Provided To: Patient Education Topics Provided Offloading: Handouts: What is Offloadingo, Other: stay off of wounds most  of the day. Methods: Demonstration, Explain/Verbal Responses: State content correctly Electronic Signature(s) Signed: 08/12/2019 9:44:09 AM By: Rodell PernaScott, Dajea Entered By: Rodell PernaScott, Dajea on 08/11/2019 14:17:53 Winfred LeedsWILSON, Maya L. ([id number]) -------------------------------------------------------------------------------- Wound Assessment Details Patient Name: Winfred LeedsWILSON, Rossi L. Date of Service: 08/11/2019 1:30 PM Medical Record Number: [id number] Patient Account Number: [id number] Date of Birth/Sex: 02-22-1976 ([age] y.o. M) Treating RN: Rodell PernaScott, Dajea Primary Care Kyoko Elsea: FITZGERALD, DAVID Other Clinician: Referring Gyneth Hubka: FITZGERALD, DAVID Treating Gaege Sangalang/Extender: Maxwell CaulOBSON, MICHAEL G Weeks in Treatment: 3 Wound Status Wound Number: 10 Primary Etiology: Pressure Ulcer Wound Location: Midline Sacrum Wound Status: Open Wounding Event: Pressure Injury Comorbid History: Paraplegia Date Acquired: 04/30/2011 Weeks Of Treatment: 3 Clustered Wound: No Photos Wound Measurements Length: (cm) 3 % R Width: (cm) 6.1 % R Depth: (cm) 0.2 Epi Area: (cm) 14.373 Volume: (cm) 2.875 eduction in Area: 19.7% eduction in Volume: 19.7% thelialization: None Wound Description Classification: Category/Stage III Fou Wound Margin: Epibole Slo Exudate Amount: Medium Exudate Type: Serous Exudate Color: amber l Odor After Cleansing: No ugh/Fibrino Yes Wound Bed Granulation Amount: Large (67-100%) Exposed Structure Granulation Quality: Pink Fascia Exposed: No Necrotic Amount: Small (1-33%) Fat Layer (Subcutaneous Tissue) Exposed: Yes Necrotic Quality: Adherent Slough Tendon Exposed: No Muscle Exposed: No Joint Exposed: No Bone Exposed: No Treatment Notes Wound #10 (Midline Sacrum) Notes wet-to-dry Electronic Signature(s) Signed: 08/12/2019 9:44:09 AM By: Maryan PulsScott, Dajea Smotherman, Ferdinand LangoICHARD L. ([id number]) Entered By: Rodell PernaScott, Dajea on 08/11/2019 13:50:52 Winfred LeedsWILSON, Maxfield L.  ([id number]) -------------------------------------------------------------------------------- Wound Assessment Details Patient Name: Winfred LeedsWILSON, Jovanie L. Date of Service: 08/11/2019 1:30 PM Medical Record Number: [id number] Patient Account Number: [id number] Date of Birth/Sex: 02-22-1976 (44 y.o. M) Treating RN: Rodell PernaScott, Dajea Primary Care Trinady Milewski: FITZGERALD, DAVID Other Clinician: Referring Endora Teresi: FITZGERALD, DAVID Treating Krisi Azua/Extender: Maxwell CaulOBSON, MICHAEL G Weeks in Treatment: 3 Wound Status Wound Number: 11 Primary Etiology: Pressure Ulcer Wound Location: Right Sacrum Wound Status: Open Wounding Event: Pressure Injury Comorbid History: Paraplegia Date Acquired: 06/28/2019 Weeks Of Treatment: 3 Clustered Wound: No Photos Wound Measurements Length: (cm) Width: (cm) Depth: (cm) Area: (cm) Volume: (cm) 0 % Reduction in Area: 100% 0 % Reduction in Volume: 100% 0 Epithelialization: Large (67-100%) 0 Tunneling: No 0 Undermining: No Wound Description Classification: Category/Stage II Wound Margin: Flat and Intact Exudate Amount: None Present Foul Odor After Cleansing: No Slough/Fibrino No Wound Bed Granulation Amount: None Present (0%) Exposed Structure Necrotic Amount: None Present (0%) Fascia Exposed: No Fat Layer (Subcutaneous Tissue) Exposed: No Tendon Exposed: No Muscle Exposed: No Joint Exposed: No Bone Exposed: No Treatment Notes Wound #11 (Right Sacrum) Notes wet-to-dry Electronic Signature(s) Signed: 08/12/2019 9:44:09 AM By: Rodell PernaScott, Dajea Entered By: Rodell PernaScott, Dajea on 08/11/2019 13:51:15 Winfred LeedsWILSON, Bethany L. ([id number]) Andrey CampanileWILSON, Shizuo Elbert EwingsL. ([id number]) -------------------------------------------------------------------------------- Wound Assessment Details Patient Name: Winfred LeedsWILSON, Vladimir L. Date of Service: 08/11/2019 1:30 PM Medical Record Number: [id number] Patient Account Number: [id number] Date of Birth/Sex: 02-22-1976 (44 y.o. M) Treating RN: Rodell PernaScott,  Dajea Primary Care Waylon Koffler: FITZGERALD, DAVID Other Clinician: Referring Osmin Welz: FITZGERALD, DAVID Treating Braedan Meuth/Extender: Maxwell CaulOBSON, MICHAEL G Weeks in Treatment: 3 Wound Status Wound Number: 12  Primary Etiology: Pressure Ulcer Wound Location: Right Ischium Wound Status: Open Wounding Event: Pressure Injury Comorbid History: Paraplegia Date Acquired: 04/29/2012 Weeks Of Treatment: 3 Clustered Wound: No Photos Wound Measurements Length: (cm) 4 % R Width: (cm) 5.7 % R Depth: (cm) 2.7 Epi Area: (cm) 17.907 Volume: (cm) 48.349 eduction in Area: 36.7% eduction in Volume: -144.3% thelialization: None Wound Description Classification: Category/Stage III Fou Wound Margin: Flat and Intact Slo Exudate Amount: Medium Exudate Type: Serous Exudate Color: amber l Odor After Cleansing: No ugh/Fibrino Yes Wound Bed Granulation Amount: Large (67-100%) Exposed Structure Granulation Quality: Pink Fascia Exposed: No Necrotic Amount: Small (1-33%) Fat Layer (Subcutaneous Tissue) Exposed: Yes Necrotic Quality: Adherent Slough Tendon Exposed: No Muscle Exposed: No Joint Exposed: No Bone Exposed: No Treatment Notes Wound #12 (Right Ischium) Notes wet-to-dry Electronic Signature(s) Signed: 08/12/2019 9:44:09 AM By: Maryan Puls, Ferdinand Lango ([id number]) Entered By: Rodell Perna on 08/11/2019 13:51:36 [name] ([id number]) -------------------------------------------------------------------------------- Wound Assessment Details Patient Name: [name]. Date of Service: 08/11/2019 1:30 PM Medical Record Number: [id number] Patient Account Number: [id number] Date of Birth/Sex: 07/14/1975 (44 y.o. M) Treating RN: Rodell Perna Primary Care Shenice Dolder: FITZGERALD, DAVID Other Clinician: Referring Jayzon Taras: FITZGERALD, DAVID Treating Sears Oran/Extender: Maxwell Caul Weeks in Treatment: 3 Wound Status Wound Number: 13 Primary Etiology: Pressure Ulcer Wound  Location: Left Ischium Wound Status: Open Wounding Event: Pressure Injury Comorbid History: Paraplegia Date Acquired: 04/29/2013 Weeks Of Treatment: 3 Clustered Wound: No Photos Wound Measurements Length: (cm) 2.5 % R Width: (cm) 6.5 % R Depth: (cm) 1.2 Epi Area: (cm) 12.763 Volume: (cm) 15.315 eduction in Area: -35.4% eduction in Volume: -25% thelialization: None Wound Description Classification: Category/Stage III Fou Wound Margin: Flat and Intact Slo Exudate Amount: Medium Exudate Type: Serous Exudate Color: amber l Odor After Cleansing: No ugh/Fibrino Yes Wound Bed Granulation Amount: Large (67-100%) Exposed Structure Granulation Quality: Pink Fascia Exposed: No Necrotic Amount: Small (1-33%) Fat Layer (Subcutaneous Tissue) Exposed: Yes Necrotic Quality: Adherent Slough Tendon Exposed: No Muscle Exposed: No Joint Exposed: No Bone Exposed: No Treatment Notes Wound #13 (Left Ischium) Notes wet-to-dry Electronic Signature(s) Signed: 08/12/2019 9:44:09 AM By: Maryan Puls, Ferdinand Lango ([id number]) Entered By: Rodell Perna on 08/11/2019 13:51:59 [name] ([id number]) -------------------------------------------------------------------------------- Vitals Details Patient Name: [name]. Date of Service: 08/11/2019 1:30 PM Medical Record Number: [id number] Patient Account Number: [id number] Date of Birth/Sex: 1976/03/23 (44 y.o. M) Treating RN: Huel Coventry Primary Care Magdaline Zollars: FITZGERALD, DAVID Other Clinician: Referring Damone Fancher: FITZGERALD, DAVID Treating Dravon Nott/Extender: Maxwell Caul Weeks in Treatment: 3 Vital Signs Time Taken: 13:25 Temperature (F): 98.2 Weight (lbs): 106 Pulse (bpm): 85 Respiratory Rate (breaths/min): 16 Blood Pressure (mmHg): 128/75 Reference Range: 80 - 120 mg / dl Electronic Signature(s) Signed: 08/11/2019 3:16:46 PM By: Dayton Martes RCP, RRT, CHT Entered By: Weyman Rodney,  Lucio Edward on 08/11/2019 13:29:47

## 2019-08-12 NOTE — Progress Notes (Signed)
Jonathon York, Jonathon York ([id number]) Visit Report for 08/11/2019 HPI Details Patient Name: Jonathon York, Jonathon York. Date of Service: 08/11/2019 1:30 PM Medical Record Number: [id number] Patient Account Number: [id number] Date of Birth/Sex: 1975/05/16 (44 y.o. M) Treating RN: Huel Coventry Primary Care Provider: FITZGERALD, DAVID Other Clinician: Referring Provider: FITZGERALD, DAVID Treating Provider/Extender: Altamese Garfield in Treatment: 3 History of Present Illness HPI Description: 30 paraplegic with a sacral and left ischial ulcers which he has had for over 3 years now (first presented to our clinic on 08/19/13). They have intermittently become smaller/larger over that time. He was previously treated here and was recommended for flap closure of his wounds. Unfortunately, he never underwent those procedures because he continued to smoke and had extremely poor nutrition. He returns for followup today. He has been compliant with his alginate dressings and does have a new air mattress. His MRI did show underlying osteomyelitis at the site of his ischial wound. He would qualify for hyperbaric oxygen therapy but after submitting his work to insurance is proven to be too expensive for him. 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 He has a chronic Foley catheter. He gives a history of longstanding wounds over the sacrum and bilateral ischial areas. Indeed he was apparently in this clinic several occasions prior to it becoming a [facility]. I can only see 1 of these in 2015 at which time he had a left ischial ulcer. He was in [facility] in 2009 with a pressure ulcer of the sacrum and chronic osteomyelitis of the pelvis. At that point he had wounds on his right foot and required ray amputations. He was recently in hospital from 07/13/2019 through 07/16/2019. Initially admitted with a slight  leukocytosis at 15.2 CT scan showed chronic osteomyelitis he was seen by infectious disease who did not feel he needed IV antibiotics but discharged him on Augmentin. He did not feel he needed to be treated for the osteomyelitis noted that he had been previously treated for this a year or 2 ago. He saw general surgery they did not think any debridement was necessary. He has now at home. [facility] has [facility] they are looking for orders. He says he is in his bed more and eating relatively well. He does not want an offloading mattress because it is difficult to transfer. He does have a Roho cushion on his wheelchair where he spends about 8 hours a day. Also of note he apparently was noted to have urinary leakage from the right ischial wound. He has an appointment to discuss this with urology at [facility] sometime in May but Dr. Sampson Goon is trying to move this up for him. 08/11/2019; the patient returns to clinic for review of pressure ulcers over the lower sacrum and bilateral ischial tuberosities. A smaller area in close juxtaposition to the sacral wound is closed today. The wounds generally look healthy. He is using a wet-to-dry dressing on these daily. He is actually putting dressings on this himself Electronic Signature(s) Signed: 08/11/2019 4:08:23 PM By: Baltazar Najjar MD Entered By: Baltazar Najjar on 08/11/2019 15:16:15 Jonathon York ([id number]) -------------------------------------------------------------------------------- Physical Exam Details Patient Name: Jonathon York, CALO. Date of Service: 08/11/2019 1:30 PM Medical Record Number: [id number] Patient Account Number: [id number] Date of Birth/Sex: [birth year] (44 y.o. M) Treating RN: Huel Coventry Primary Care Provider: FITZGERALD, DAVID Other Clinician: Referring Provider: FITZGERALD, DAVID Treating Provider/Extender: Maxwell Caul Weeks in  Treatment: 3 Constitutional Sitting or standing Blood Pressure is within target range  for patient.. Pulse regular and within target range for patient.[name]. Respirations regular, non- labored and within target range.. Temperature is normal and within the target range for the patient.[name]. appears in no distress. Notes Wound exam; wounds over his sacrum and bilateral ischial tuberosity areas actually look fairly decent. Surfaces are clean. The only 1 with any depth is on the left ischial tuberosity. I see no drainage no purulence. No soft tissue infection. Electronic Signature(s) Signed: 08/11/2019 4:08:23 PM By: Baltazar Najjarobson, Cicero Noy MD Entered By: Baltazar Najjarobson, Sarahy Creedon on 08/11/2019 15:18:25 Jonathon LeedsWILSON, Jonathon L. ([id number]) -------------------------------------------------------------------------------- Physician Orders Details Patient Name: Jonathon LeedsWILSON, Jonathon L. Date of Service: 08/11/2019 1:30 PM Medical Record Number: [id number] Patient Account Number: [id number] Date of Birth/Sex: 1975/06/21 (44 y.o. M) Treating RN: Rodell PernaScott, Dajea Primary Care Provider: FITZGERALD, DAVID Other Clinician: Referring Provider: FITZGERALD, DAVID Treating Provider/Extender: Maxwell CaulOBSON, Khalia Gong G Weeks in Treatment: 3 Verbal / Phone Orders: No Diagnosis Coding Wound Cleansing Wound #10 Midline Sacrum o Clean wound with Normal Saline. o Cleanse wound with mild soap and water Wound #11 Right Sacrum o Clean wound with Normal Saline. o Cleanse wound with mild soap and water Wound #12 Right Ischium o Clean wound with Normal Saline. o Cleanse wound with mild soap and water Wound #13 Left Ischium o Clean wound with Normal Saline. o Cleanse wound with mild soap and water Anesthetic (add to Medication List) Wound #10 Midline Sacrum o Topical Lidocaine 4% cream applied to wound bed prior to debridement (In Clinic Only). Wound #11 Right Sacrum o Topical Lidocaine 4% cream applied to wound bed prior to debridement (In Clinic Only). Wound #12 Right Ischium o Topical Lidocaine 4% cream applied to wound  bed prior to debridement (In Clinic Only). Wound #13 Left Ischium o Topical Lidocaine 4% cream applied to wound bed prior to debridement (In Clinic Only). Primary Wound Dressing Wound #10 Midline Sacrum o Saline moistened gauze Wound #11 Right Sacrum o Saline moistened gauze Wound #12 Right Ischium o Saline moistened gauze Wound #13 Left Ischium o Saline moistened gauze Secondary Dressing Wound #10 Midline Sacrum o Other - gauze and abd Wound #11 Right Sacrum o Other - gauze and abd Wound #12 Right Ischium o Other - gauze and abd Wound #13 Left Ischium o Other - gauze and abd Jonathon LeedsWILSON, Verlan L. ([id number]) Dressing Change Frequency Wound #10 Midline Sacrum o Change dressing every day. - more often as needed Wound #11 Right Sacrum o Change dressing every day. - more often as needed Wound #12 Right Ischium o Change dressing every day. - more often as needed Wound #13 Left Ischium o Change dressing every day. - more often as needed Follow-up Appointments Wound #10 Midline Sacrum o Return Appointment in 2 weeks. Wound #11 Right Sacrum o Return Appointment in 2 weeks. Wound #12 Right Ischium o Return Appointment in 2 weeks. Wound #13 Left Ischium o Return Appointment in 2 weeks. Off-Loading Wound #10 Midline Sacrum o Turn and reposition every 2 hours Wound #11 Right Sacrum o Turn and reposition every 2 hours Wound #12 Right Ischium o Turn and reposition every 2 hours Wound #13 Left Ischium o Turn and reposition every 2 hours Additional Orders / Instructions Wound #10 Midline Sacrum o Increase protein intake. Wound #11 Right Sacrum o Increase protein intake. Wound #12 Right Ischium o Increase protein intake. Wound #13 Left Ischium o Increase protein intake. Home Health Wound #10 Midline Sacrum o Continue Home Health Visits -  [facility] o Continue Home Health Visits - [facility]-once  weekly-please order supplies for patient's daily dressing changes. o Home Health Nurse may visit PRN to address patientos wound care needs. o Home Health Nurse may visit PRN to address patientos wound care needs. o FACE TO FACE ENCOUNTER: MEDICARE and MEDICAID PATIENTS: I certify that this patient is under my care and that I had a face-to- face encounter that meets the physician face-to-face encounter requirements with this patient on this date. The encounter with the patient was in whole or in part for the following MEDICAL CONDITION: (primary reason for Home Healthcare) MEDICAL NECESSITY: I certify, that based on my findings, NURSING services are a medically necessary home health service. HOME BOUND STATUS: I certify that my clinical findings support that this patient is homebound (i.e., Due to illness or injury, pt requires aid of supportive devices such as crutches, cane, wheelchairs, walkers, the use of special transportation or the assistance of another person to leave DARRIUS, MONTANO. ([id number]) their place of residence. There is a normal inability to leave the home and doing so requires considerable and taxing effort. Other absences are for medical reasons / religious services and are infrequent or of short duration when for other reasons). o FACE TO FACE ENCOUNTER: MEDICARE and MEDICAID PATIENTS: I certify that this patient is under my care and that I had a face-to- face encounter that meets the physician face-to-face encounter requirements with this patient on this date. The encounter with the patient was in whole or in part for the following MEDICAL CONDITION: (primary reason for Home Healthcare) MEDICAL NECESSITY: I certify, that based on my findings, NURSING services are a medically necessary home health service. HOME BOUND STATUS: I certify that my clinical findings support that this patient is homebound (i.e., Due to illness or injury, pt requires aid of supportive devices  such as crutches, cane, wheelchairs, walkers, the use of special transportation or the assistance of another person to leave their place of residence. There is a normal inability to leave the home and doing so requires considerable and taxing effort. Other absences are for medical reasons / religious services and are infrequent or of short duration when for other reasons). o If current dressing causes regression in wound condition, may D/C ordered dressing product/s and apply Normal Saline Moist Dressing daily until next Wound Healing Center / Other MD appointment. Notify Wound Healing Center of regression in wound condition at [phone]. o If current dressing causes regression in wound condition, may D/C ordered dressing product/s and apply Normal Saline Moist Dressing daily until next Wound Healing Center / Other MD appointment. Notify Wound Healing Center of regression in wound condition at [phone]. o Please direct any NON-WOUND related issues/requests for orders to patient's Primary Care Physician o Please direct any NON-WOUND related issues/requests for orders to patient's Primary Care Physician Wound #11 Right Sacrum o Continue Home Health Visits - [facility]-once weekly-please order supplies for patient's daily dressing changes. o Home Health Nurse may visit PRN to address patientos wound care needs. o FACE TO FACE ENCOUNTER: MEDICARE and MEDICAID PATIENTS: I certify that this patient is under my care and that I had a face-to- face encounter that meets the physician face-to-face encounter requirements with this patient on this date. The encounter with the patient was in whole or in part for the following MEDICAL CONDITION: (primary reason for Home Healthcare) MEDICAL NECESSITY: I certify, that based on my findings, NURSING services are a medically  necessary home health service. HOME BOUND STATUS: I certify that my clinical findings support that this patient is  homebound (i.e., Due to illness or injury, pt requires aid of supportive devices such as crutches, cane, wheelchairs, walkers, the use of special transportation or the assistance of another person to leave their place of residence. There is a normal inability to leave the home and doing so requires considerable and taxing effort. Other absences are for medical reasons / religious services and are infrequent or of short duration when for other reasons). o If current dressing causes regression in wound condition, may D/C ordered dressing product/s and apply Normal Saline Moist Dressing daily until next Wound Healing Center / Other MD appointment. Notify Wound Healing Center of regression in wound condition at [phone]. o Please direct any NON-WOUND related issues/requests for orders to patient's Primary Care Physician Wound #12 Right Ischium o Continue Home Health Visits - [facility]-once weekly-please order supplies for patient's daily dressing changes. o Home Health Nurse may visit PRN to address patientos wound care needs. o FACE TO FACE ENCOUNTER: MEDICARE and MEDICAID PATIENTS: I certify that this patient is under my care and that I had a face-to- face encounter that meets the physician face-to-face encounter requirements with this patient on this date. The encounter with the patient was in whole or in part for the following MEDICAL CONDITION: (primary reason for Home Healthcare) MEDICAL NECESSITY: I certify, that based on my findings, NURSING services are a medically necessary home health service. HOME BOUND STATUS: I certify that my clinical findings support that this patient is homebound (i.e., Due to illness or injury, pt requires aid of supportive devices such as crutches, cane, wheelchairs, walkers, the use of special transportation or the assistance of another person to leave their place of residence. There is a normal inability to leave the home and doing so  requires considerable and taxing effort. Other absences are for medical reasons / religious services and are infrequent or of short duration when for other reasons). o If current dressing causes regression in wound condition, may D/C ordered dressing product/s and apply Normal Saline Moist Dressing daily until next Wound Healing Center / Other MD appointment. Notify Wound Healing Center of regression in wound condition at [phone]. o Please direct any NON-WOUND related issues/requests for orders to patient's Primary Care Physician Wound #13 Left Ischium o Continue Home Health Visits - [facility]-once weekly-please order supplies for patient's daily dressing changes. o Home Health Nurse may visit PRN to address patientos wound care needs. o FACE TO FACE ENCOUNTER: MEDICARE and MEDICAID PATIENTS: I certify that this patient is under my care and that I had a face-to- face encounter that meets the physician face-to-face encounter requirements with this patient on this date. The encounter with the patient was in whole or in part for the following MEDICAL CONDITION: (primary reason for Home Healthcare) MEDICAL NECESSITY: I certify, that based on my findings, NURSING services are a medically necessary home health service. HOME BOUND STATUS: I certify that my clinical findings support that this patient is homebound (i.e., Due to illness or injury, pt requires aid of supportive devices such as crutches, cane, wheelchairs, walkers, the use of special transportation or the assistance of another person to leave their place of residence. There is a normal inability to leave the home and doing so requires considerable and taxing effort. Other absences are for medical reasons / religious services and are infrequent or of short duration when for other  reasons). o If current dressing causes regression in wound condition, may D/C ordered dressing product/s and apply Normal Saline  Moist Dressing daily until next [place] / Other MD appointment. [place] of regression in wound condition at [phone]. o Please direct any NON-WOUND related issues/requests for orders to patient's Primary Care Physician Electronic Signature(s) Jonathon York, Jonathon York ([id number]) Signed: 08/11/2019 4:08:23 PM By: Linton Ham MD Signed: 08/12/2019 9:44:09 AM By: Army Melia Entered By: Army Melia on 08/11/2019 14:15:58 Jonathon York ([id number]) -------------------------------------------------------------------------------- Problem List Details Patient Name: Jonathon York, Jonathon York. Date of Service: 08/11/2019 1:30 PM Medical Record Number: [id number] Patient Account Number: [id number] Date of Birth/Sex: 1975-09-27 (44 y.o. M) Treating RN: Cornell Barman Primary Care Provider: FITZGERALD, DAVID Other Clinician: Referring Provider: FITZGERALD, DAVID Treating Provider/Extender: Ricard Dillon Weeks in Treatment: 3 Active Problems ICD-10 Evaluated Encounter Code Description Active Date Today Diagnosis L89.153 Pressure ulcer of sacral region, stage 3 07/21/2019 No Yes L89.313 Pressure ulcer of right buttock, stage 3 07/21/2019 No Yes L89.323 Pressure ulcer of left buttock, stage 3 07/21/2019 No Yes G82.21 Paraplegia, complete 07/21/2019 No Yes Inactive Problems Resolved Problems Electronic Signature(s) Signed: 08/11/2019 4:08:23 PM By: Linton Ham MD Entered By: Linton Ham on 08/11/2019 15:14:04 Jonathon York ([id number]) -------------------------------------------------------------------------------- Progress Note Details Patient Name: Jonathon York. Date of Service: 08/11/2019 1:30 PM Medical Record Number: [id number] Patient Account Number: [id number] Date of Birth/Sex: 1975/11/22 (44 y.o. M) Treating RN: Cornell Barman Primary Care Provider: FITZGERALD, DAVID Other Clinician: Referring Provider: FITZGERALD, DAVID Treating  Provider/Extender: Ricard Dillon Weeks in Treatment: 3 Subjective History of Present Illness (HPI) 38 paraplegic with a sacral and left ischial ulcers which he has had for over 3 years now (first presented to our clinic on 08/19/13). They have intermittently become smaller/larger over that time. He was previously treated here and was recommended for flap closure of his wounds. Unfortunately, he never underwent those procedures because he continued to smoke and had extremely poor nutrition. He returns for followup today. He has been compliant with his alginate dressings and does have a new air mattress. His MRI did show underlying osteomyelitis at the site of his ischial wound. He would qualify for hyperbaric oxygen therapy but after submitting his work to insurance is proven to be too expensive for him. 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 He has a chronic Foley catheter. He gives a history of longstanding wounds over the sacrum and bilateral ischial areas. Indeed he was apparently in this clinic several occasions prior to it becoming a [facility]. I can only see 1 of these in 2015 at which time he had a left ischial ulcer. He was in [facility] in 2009 with a pressure ulcer of the sacrum and chronic osteomyelitis of the pelvis. At that point he had wounds on his right foot and required ray amputations. He was recently in hospital from 07/13/2019 through 07/16/2019. Initially admitted with a slight leukocytosis at 15.2 CT scan showed chronic osteomyelitis he was seen by infectious disease who did not feel he needed IV antibiotics but discharged him on Augmentin. He did not feel he needed to be treated for the osteomyelitis noted that he had been previously treated for this a year or 2 ago. He saw general surgery they did not think any debridement was necessary. He has now at home.  [facility] are  looking for orders. He says he is in his bed more and eating relatively well. He does not want an offloading mattress because it is difficult to transfer. He does have a Roho cushion on his wheelchair where he spends about 8 hours a day. Also of note he apparently was noted to have urinary leakage from the right ischial wound. He has an appointment to discuss this with urology at [facility] sometime in May but Dr. Sampson Goon is trying to move this up for him. 08/11/2019; the patient returns to clinic for review of pressure ulcers over the lower sacrum and bilateral ischial tuberosities. A smaller area in close juxtaposition to the sacral wound is closed today. The wounds generally look healthy. He is using a wet-to-dry dressing on these daily. He is actually putting dressings on this himself Objective Constitutional Sitting or standing Blood Pressure is within target range for patient.. Pulse regular and within target range for patient.[name] Respirations regular, non- labored and within target range.. Temperature is normal and within the target range for the patient.[name] appears in no distress. Vitals Time Taken: 1:25 PM, Weight: 106 lbs, Temperature: 98.2 F, Pulse: 85 bpm, Respiratory Rate: 16 breaths/min, Blood Pressure: 128/75 mmHg. General Notes: Wound exam; wounds over his sacrum and bilateral ischial tuberosity areas actually look fairly decent. Surfaces are clean. The only 1 with any depth is on the left ischial tuberosity. I see no drainage no purulence. No soft tissue infection. Integumentary (Hair, Skin) Wound #10 status is Open. Original cause of wound was Pressure Injury. The wound is located on the Midline Sacrum. The wound measures 3cm length x 6.1cm width x 0.2cm depth; 14.373cm^2 area and 2.875cm^3 volume. There is Fat Layer (Subcutaneous Tissue) Exposed exposed. There is a medium amount of serous drainage noted. The wound margin is epibole. There is large  (67-100%) pink granulation within the wound bed. There is a small (1-33%) amount of necrotic tissue within the wound bed including Adherent Slough. Jonathon York, Jonathon York ([id number]) Wound #11 status is Open. Original cause of wound was Pressure Injury. The wound is located on the Right Sacrum. The wound measures 0cm length x 0cm width x 0cm depth; 0cm^2 area and 0cm^3 volume. There is no tunneling or undermining noted. There is a none present amount of drainage noted. The wound margin is flat and intact. There is no granulation within the wound bed. There is no necrotic tissue within the wound bed. Wound #12 status is Open. Original cause of wound was Pressure Injury. The wound is located on the Right Ischium. The wound measures 4cm length x 5.7cm width x 2.7cm depth; 17.907cm^2 area and 48.349cm^3 volume. There is Fat Layer (Subcutaneous Tissue) Exposed exposed. There is a medium amount of serous drainage noted. The wound margin is flat and intact. There is large (67-100%) pink granulation within the wound bed. There is a small (1-33%) amount of necrotic tissue within the wound bed including Adherent Slough. Wound #13 status is Open. Original cause of wound was Pressure Injury. The wound is located on the Left Ischium. The wound measures 2.5cm length x 6.5cm width x 1.2cm depth; 12.763cm^2 area and 15.315cm^3 volume. There is Fat Layer (Subcutaneous Tissue) Exposed exposed. There is a medium amount of serous drainage noted. The wound margin is flat and intact. There is large (67-100%) pink granulation within the wound bed. There is a small (1-33%) amount of necrotic tissue within the wound bed including Adherent Slough. Assessment Active Problems ICD-10 Pressure ulcer of sacral region, stage 3 Pressure  ulcer of right buttock, stage 3 Pressure ulcer of left buttock, stage 3 Paraplegia, complete Plan Wound Cleansing: Wound #10 Midline Sacrum: Clean wound with Normal Saline. Cleanse wound with  mild soap and water Wound #11 Right Sacrum: Clean wound with Normal Saline. Cleanse wound with mild soap and water Wound #12 Right Ischium: Clean wound with Normal Saline. Cleanse wound with mild soap and water Wound #13 Left Ischium: Clean wound with Normal Saline. Cleanse wound with mild soap and water Anesthetic (add to Medication List): Wound #10 Midline Sacrum: Topical Lidocaine 4% cream applied to wound bed prior to debridement (In Clinic Only). Wound #11 Right Sacrum: Topical Lidocaine 4% cream applied to wound bed prior to debridement (In Clinic Only). Wound #12 Right Ischium: Topical Lidocaine 4% cream applied to wound bed prior to debridement (In Clinic Only). Wound #13 Left Ischium: Topical Lidocaine 4% cream applied to wound bed prior to debridement (In Clinic Only). Primary Wound Dressing: Wound #10 Midline Sacrum: Saline moistened gauze Wound #11 Right Sacrum: Saline moistened gauze Wound #12 Right Ischium: Saline moistened gauze Wound #13 Left Ischium: Saline moistened gauze Secondary Dressing: Wound #10 Midline Sacrum: Other - gauze and abd Wound #11 Right Sacrum: Jonathon York, Jonathon York ([id number]) Other - gauze and abd Wound #12 Right Ischium: Other - gauze and abd Wound #13 Left Ischium: Other - gauze and abd Dressing Change Frequency: Wound #10 Midline Sacrum: Change dressing every day. - more often as needed Wound #11 Right Sacrum: Change dressing every day. - more often as needed Wound #12 Right Ischium: Change dressing every day. - more often as needed Wound #13 Left Ischium: Change dressing every day. - more often as needed Follow-up Appointments: Wound #10 Midline Sacrum: Return Appointment in 2 weeks. Wound #11 Right Sacrum: Return Appointment in 2 weeks. Wound #12 Right Ischium: Return Appointment in 2 weeks. Wound #13 Left Ischium: Return Appointment in 2 weeks. Off-Loading: Wound #10 Midline Sacrum: Turn and reposition every 2  hours Wound #11 Right Sacrum: Turn and reposition every 2 hours Wound #12 Right Ischium: Turn and reposition every 2 hours Wound #13 Left Ischium: Turn and reposition every 2 hours Additional Orders / Instructions: Wound #10 Midline Sacrum: Increase protein intake. Wound #11 Right Sacrum: Increase protein intake. Wound #12 Right Ischium: Increase protein intake. Wound #13 Left Ischium: Increase protein intake. Home Health: Wound #10 Midline Sacrum: Continue Home Health Visits - [facility] Continue Home Health Visits - [facility]-once weekly-please order supplies for patient's daily dressing changes. Home Health Nurse may visit PRN to address patient s wound care needs. Home Health Nurse may visit PRN to address patient s wound care needs. FACE TO FACE ENCOUNTER: MEDICARE and MEDICAID PATIENTS: I certify that this patient is under my care and that I had a face-to-face encounter that meets the physician face-to-face encounter requirements with this patient on this date. The encounter with the patient was in whole or in part for the following MEDICAL CONDITION: (primary reason for Home Healthcare) MEDICAL NECESSITY: I certify, that based on my findings, NURSING services are a medically necessary home health service. HOME BOUND STATUS: I certify that my clinical findings support that this patient is homebound (i.e., Due to illness or injury, pt requires aid of supportive devices such as crutches, cane, wheelchairs, walkers, the use of special transportation or the assistance of another person to leave their place of residence. There is a normal inability to leave the home and doing so requires considerable and taxing effort. Other  absences are for medical reasons / religious services and are infrequent or of short duration when for other reasons). FACE TO FACE ENCOUNTER: MEDICARE and MEDICAID PATIENTS: I certify that this patient is under my care and that I had a  face-to-face encounter that meets the physician face-to-face encounter requirements with this patient on this date. The encounter with the patient was in whole or in part for the following MEDICAL CONDITION: (primary reason for Home Healthcare) MEDICAL NECESSITY: I certify, that based on my findings, NURSING services are a medically necessary home health service. HOME BOUND STATUS: I certify that my clinical findings support that this patient is homebound (i.e., Due to illness or injury, pt requires aid of supportive devices such as crutches, cane, wheelchairs, walkers, the use of special transportation or the assistance of another person to leave their place of residence. There is a normal inability to leave the home and doing so requires considerable and taxing effort. Other absences are for medical reasons / religious services and are infrequent or of short duration when for other reasons). If current dressing causes regression in wound condition, may D/C ordered dressing product/s and apply Normal Saline Moist Dressing daily until next Wound Healing Center / Other MD appointment. Notify Wound Healing Center of regression in wound condition at [phone]. If current dressing causes regression in wound condition, may D/C ordered dressing product/s and apply Normal Saline Moist Dressing daily until next Wound Healing Center / Other MD appointment. Notify Wound Healing Center of regression in wound condition at [phone]. Please direct any NON-WOUND related issues/requests for orders to patient's Primary Care Physician Please direct any NON-WOUND related issues/requests for orders to patient's Primary Care Physician Wound #11 Right Sacrum: Continue Home Health Visits - [facility]-once weekly-please order supplies for patient's daily dressing changes. Home Health Nurse may visit PRN to address patient s wound care needs. Jonathon York, Jonathon York ([id number]) FACE TO FACE ENCOUNTER: MEDICARE  and MEDICAID PATIENTS: I certify that this patient is under my care and that I had a face-to-face encounter that meets the physician face-to-face encounter requirements with this patient on this date. The encounter with the patient was in whole or in part for the following MEDICAL CONDITION: (primary reason for Home Healthcare) MEDICAL NECESSITY: I certify, that based on my findings, NURSING services are a medically necessary home health service. HOME BOUND STATUS: I certify that my clinical findings support that this patient is homebound (i.e., Due to illness or injury, pt requires aid of supportive devices such as crutches, cane, wheelchairs, walkers, the use of special transportation or the assistance of another person to leave their place of residence. There is a normal inability to leave the home and doing so requires considerable and taxing effort. Other absences are for medical reasons / religious services and are infrequent or of short duration when for other reasons). If current dressing causes regression in wound condition, may D/C ordered dressing product/s and apply Normal Saline Moist Dressing daily until next Wound Healing Center / Other MD appointment. Notify Wound Healing Center of regression in wound condition at [phone]. Please direct any NON-WOUND related issues/requests for orders to patient's Primary Care Physician Wound #12 Right Ischium: Continue Home Health Visits - [facility]-once weekly-please order supplies for patient's daily dressing changes. Home Health Nurse may visit PRN to address patient s wound care needs. FACE TO FACE ENCOUNTER: MEDICARE and MEDICAID PATIENTS: I certify that this patient is under my care and that I had a face-to-face encounter  that meets the physician face-to-face encounter requirements with this patient on this date. The encounter with the patient was in whole or in part for the following MEDICAL CONDITION: (primary reason for Home  Healthcare) MEDICAL NECESSITY: I certify, that based on my findings, NURSING services are a medically necessary home health service. HOME BOUND STATUS: I certify that my clinical findings support that this patient is homebound (i.e., Due to illness or injury, pt requires aid of supportive devices such as crutches, cane, wheelchairs, walkers, the use of special transportation or the assistance of another person to leave their place of residence. There is a normal inability to leave the home and doing so requires considerable and taxing effort. Other absences are for medical reasons / religious services and are infrequent or of short duration when for other reasons). If current dressing causes regression in wound condition, may D/C ordered dressing product/s and apply Normal Saline Moist Dressing daily until next Wound Healing Center / Other MD appointment. Notify Wound Healing Center of regression in wound condition at [phone]. Please direct any NON-WOUND related issues/requests for orders to patient's Primary Care Physician Wound #13 Left Ischium: Continue Home Health Visits - [facility]-once weekly-please order supplies for patient's daily dressing changes. Home Health Nurse may visit PRN to address patient s wound care needs. FACE TO FACE ENCOUNTER: MEDICARE and MEDICAID PATIENTS: I certify that this patient is under my care and that I had a face-to-face encounter that meets the physician face-to-face encounter requirements with this patient on this date. The encounter with the patient was in whole or in part for the following MEDICAL CONDITION: (primary reason for Home Healthcare) MEDICAL NECESSITY: I certify, that based on my findings, NURSING services are a medically necessary home health service. HOME BOUND STATUS: I certify that my clinical findings support that this patient is homebound (i.e., Due to illness or injury, pt requires aid of supportive devices such as crutches,  cane, wheelchairs, walkers, the use of special transportation or the assistance of another person to leave their place of residence. There is a normal inability to leave the home and doing so requires considerable and taxing effort. Other absences are for medical reasons / religious services and are infrequent or of short duration when for other reasons). If current dressing causes regression in wound condition, may D/C ordered dressing product/s and apply Normal Saline Moist Dressing daily until next Wound Healing Center / Other MD appointment. Notify Wound Healing Center of regression in wound condition at [phone]. Please direct any NON-WOUND related issues/requests for orders to patient's Primary Care Physician 1. At this point we are continuing with the wet-to-dry dressings the patient is doing. Everything looks fairly good here. #2 he is up in the wheelchair roughly 9 hours a day. I do not sense that he is willing to change this. We could consider silver collagen-based dressings for the deeper parts of the left ischial tuberosity wound but for now everything looks as though it made some improvement. 3. Follow-up in 2 weeks Electronic Signature(s) Signed: 08/11/2019 4:08:23 PM By: Baltazar Najjar MD Entered By: Baltazar Najjar on 08/11/2019 15:20:15 Jonathon York ([id number]) -------------------------------------------------------------------------------- SuperBill Details Patient Name: Jonathon York. Date of Service: 08/11/2019 Medical Record Number: [id number] Patient Account Number: [id number] Date of Birth/Sex: 08/10/1975 (44 y.o. 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, complete Facility Procedures CPT4 Code: [id number] Description: 99213 - WOUND CARE VISIT-LEV 3 EST PT Modifier: Quantity: 1 Physician Procedures CPT4 Code: [id number] Description: 99213 - WC PHYS LEVEL 3 - EST PT Modifier: Quantity: 1 CPT4 Code: Description: ICD-10 Diagnosis Description L89.153 Pressure ulcer of sacral region, stage 3 L89.313 Pressure ulcer of right buttock, stage 3 L89.323 Pressure ulcer of left buttock, stage 3 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

## 2019-08-25 ENCOUNTER — Encounter: Payer: Medicare Other | Admitting: Internal Medicine

## 2019-08-25 ENCOUNTER — Other Ambulatory Visit: Payer: Self-pay

## 2019-08-25 DIAGNOSIS — L89153 Pressure ulcer of sacral region, stage 3: Secondary | ICD-10-CM | POA: Diagnosis not present

## 2019-08-26 NOTE — Progress Notes (Signed)
Jonathon York, Ramzy L. ([id number]) Visit Report for 08/25/2019 HPI Details Patient Name: Jonathon York, Jonathon L. Date of Service: 08/25/2019 1:15 PM Medical Record Number: [id number] Patient Account Number: [id number] Date of Birth/Sex: 11/05/1975 (44 y.o. 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 They have intermittently become smaller/larger over that time. He was previously treated here and was recommended for flap closure of his wounds. Unfortunately, he never underwent those procedures because he continued to smoke and had extremely poor nutrition. He returns for followup today. He has been compliant with his alginate dressings and does have a new air mattress. His MRI did show underlying osteomyelitis at the site of his ischial wound. He would qualify for hyperbaric oxygen therapy but after submitting his work to insurance is proven to be too expensive for him. 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 He has a chronic Foley catheter. He gives a history of longstanding wounds over the sacrum and bilateral ischial areas. Indeed he was apparently in this clinic several occasions prior to it becoming a [facility]. I can only see 1 of these in 2015 at which time he had a left ischial ulcer. He was in [facility] in 2009 with a pressure ulcer of the sacrum and chronic osteomyelitis of the pelvis. At that point he had wounds on his right foot and required ray amputations. He was recently in hospital from 07/13/2019 through 07/16/2019. Initially admitted with a slight  leukocytosis at 15.2 CT scan showed chronic osteomyelitis he was seen by infectious disease who did not feel he needed IV antibiotics but discharged him on Augmentin. He did not feel he needed to be treated for the osteomyelitis noted that he had been previously treated for this a year or 2 ago. He saw general surgery they did not think any debridement was necessary. He has now at home. He has [facility] they are looking for orders. He says he is in his bed more and eating relatively well. He does not want an offloading mattress because it is difficult to transfer. He does have a Roho cushion on his wheelchair where he spends about 8 hours a day. Also of note he apparently was noted to have urinary leakage from the right ischial wound. He has [facility] in May but Dr. Sampson GoonFitzgerald is trying to move this up for him. 08/11/2019; the patient returns to clinic for review of pressure ulcers over the lower sacrum and bilateral ischial tuberosities. A smaller area in close juxtaposition to the sacral wound is closed today. The wounds generally look healthy. He is using a wet-to-dry dressing on these daily. He is actually putting dressings on this himself 4/28; pressure areas over the lower sacrum and bilateral ischial tuberosities. The small satellite lesion in the sacral wound is healed. He is using Dakin's wet-to-dry. He again states that he is up in his wheelchair 9 hours a day. I do not think he is willing to do any different than that Electronic Signature(s) Signed: 08/25/2019 4:22:42 PM By: Baltazar Najjarobson, Fenix Ruppe MD Entered By: Baltazar Najjarobson, Zamiah Tollett on 08/25/2019 13:34:23 Jonathon York, Jonathon L. (  [id number]) -------------------------------------------------------------------------------- Physician Orders Details Patient Name: Jonathon York, Jonathon York. Date of Service: 08/25/2019 1:15 PM Medical Record Number: [id number] Patient Account Number: [id number] Date of  Birth/Sex: 1975/05/18 (44 y.o. M) Treating RN: Huel Coventry Primary Care Provider: FITZGERALD, DAVID Other Clinician: Referring Provider: FITZGERALD, DAVID Treating Provider/Extender: Maxwell Caul Weeks in Treatment: 5 Verbal / Phone Orders: No Diagnosis Coding Wound Cleansing Wound #10 Midline Sacrum o Clean wound with Normal Saline. o Cleanse wound with mild soap and water Wound #12 Right Ischium o Clean wound with Normal Saline. o Cleanse wound with mild soap and water Wound #13 Left Ischium o Clean wound with Normal Saline. o Cleanse wound with mild soap and water Anesthetic (add to Medication List) Wound #10 Midline Sacrum o Topical Lidocaine 4% cream applied to wound bed prior to debridement (In Clinic Only). Wound #12 Right Ischium o Topical Lidocaine 4% cream applied to wound bed prior to debridement (In Clinic Only). Wound #13 Left Ischium o Topical Lidocaine 4% cream applied to wound bed prior to debridement (In Clinic Only). Primary Wound Dressing Wound #10 Midline Sacrum o Dakins soaked gauze Wound #12 Right Ischium o Dakins soaked gauze Wound #13 Left Ischium o Dakins soaked gauze Secondary Dressing Wound #10 Midline Sacrum o Other - gauze and abd Wound #12 Right Ischium o Other - gauze and abd Wound #13 Left Ischium o Other - gauze and abd Dressing Change Frequency Wound #10 Midline Sacrum o Change dressing every day. - more often as needed Wound #12 Right Ischium o Change dressing every day. - more often as needed Wound #13 Left Ischium o Change dressing every day. - more often as needed Follow-up Appointments Wound #10 Midline Sacrum o Return Appointment in 2 weeks. Jonathon York, Jonathon York ([id number]) Wound #12 Right Ischium o Return Appointment in 2 weeks. Wound #13 Left Ischium o Return Appointment in 2 weeks. Off-Loading Wound #10 Midline Sacrum o Turn and reposition every 2 hours Wound #12 Right  Ischium o Turn and reposition every 2 hours Wound #13 Left Ischium o Turn and reposition every 2 hours Additional Orders / Instructions Wound #10 Midline Sacrum o Increase protein intake. Wound #12 Right Ischium o Increase protein intake. Wound #13 Left Ischium o Increase protein intake. Home Health Wound #10 Midline Sacrum o [facility] Vis[facility] - [facility] o Continue Home Health Visits - [facility]-once weekly-please order supplies for patient's daily dressing changes. o Home Health Nurse may visit PRN to address patientos wound care needs. o Home Health Nurse may visit PRN to address patientos wound care needs. o FACE TO FACE ENCOUNTER: MEDICARE and MEDICAID PATIENTS: I certify that this patient is under my care and that I had a face-to-face encounter that meets the physician face-to-face encounter requirements with this patient on this date. The encounter with the patient was in whole or in part for the following MEDICAL CONDITION: (primary reason for Home Healthcare) MEDICAL NECESSITY: I certify, that based on my findings, NURSING services are a medically necessary home health service. HOME BOUND STATUS: I certify that my clinical findings support that this patient is homebound (i.e., Due to illness or injury, pt requires aid of supportive devices such as crutches, cane, wheelchairs, walkers, the use of special transportation or the assistance of another person to leave their place of residence. There is a normal inability to leave the home and doing so requires considerable and taxing effort. Other absences are for medical reasons / religious services and are infrequent or of  short duration when for other reasons). o FACE TO FACE ENCOUNTER: MEDICARE and MEDICAID PATIENTS: I certify that this patient is under my care and that I had a face-to-face encounter that meets the physician face-to-face encounter requirements with this patient  on this date. The encounter with the patient was in whole or in part for the following MEDICAL CONDITION: (primary reason for Home Healthcare) MEDICAL NECESSITY: I certify, that based on my findings, NURSING services are a medically necessary home health service. HOME BOUND STATUS: I certify that my clinical findings support that this patient is homebound (i.e., Due to illness or injury, pt requires aid of supportive devices such as crutches, cane, wheelchairs, walkers, the use of special transportation or the assistance of another person to leave their place of residence. There is a normal inability to leave the home and doing so requires considerable and taxing effort. Other absences are for medical reasons / religious services and are infrequent or of short duration when for other reasons). o If current dressing causes regression in wound condition, may D/C ordered dressing product/s and apply Normal Saline Moist Dressing daily until next Wound Healing Center / Other MD appointment. Notify Wound Healing Center of regression in wound condition at [phone]. o If current dressing causes regression in wound condition, may D/C ordered dressing product/s and apply Normal Saline Moist Dressing daily until next Wound Healing Center / Other MD appointment. Notify Wound Healing Center of regression in wound condition at [phone]. o Please direct any NON-WOUND related issues/requests for orders to patient's Primary Care Physician o Please direct any NON-WOUND related issues/requests for orders to patient's Primary Care Physician Wound #12 Right Ischium o Continue [facility] Visits - [facility] o Continue Home Health Visits - [facility]-once weekly-please order supplies for patient's daily dressing changes. o Home Health Nurse may visit PRN to address patientos wound care needs. o Home Health Nurse may visit PRN to address patientos wound care needs. o FACE  TO FACE ENCOUNTER: MEDICARE and MEDICAID PATIENTS: I certify that this patient is under my care and that I had a face-to-face encounter that meets the physician face-to-face encounter requirements with this patient on this date. The encounter with the patient was in whole or in part for the following MEDICAL CONDITION: (primary reason for Home Healthcare) MEDICAL NECESSITY: I certify, that based on my findings, NURSING services are a medically necessary home health service. HOME BOUND STATUS: I certify that my clinical findings support that this patient is homebound (i.e., Due to illness or injury, pt requires aid of supportive devices such as crutches, cane, wheelchairs, walkers, the use of special transportation or the assistance of another person to leave their place of residence. There is a normal inability to leave the home and doing so requires considerable and taxing effort. Other absences are for medical reasons / religious services and are infrequent or of short duration when for other reasons). Jonathon York, Jonathon York ([id number]) o FACE TO FACE ENCOUNTER: MEDICARE and MEDICAID PATIENTS: I certify that this patient is under my care and that I had a face-to-face encounter that meets the physician face-to-face encounter requirements with this patient on this date. The encounter with the patient was in whole or in part for the following MEDICAL CONDITION: (primary reason for Home Healthcare) MEDICAL NECESSITY: I certify, that based on my findings, NURSING services are a medically necessary home health service. HOME BOUND STATUS: I certify that my clinical findings support that this patient is homebound (i.e., Due  to illness or injury, pt requires aid of supportive devices such as crutches, cane, wheelchairs, walkers, the use of special transportation or the assistance of another person to leave their place of residence. There is a normal inability to leave the home and doing so requires  considerable and taxing effort. Other absences are for medical reasons / religious services and are infrequent or of short duration when for other reasons). o If current dressing causes regression in wound condition, may D/C ordered dressing product/s and apply Normal Saline Moist Dressing daily until next Wound Healing Center / Other MD appointment. Notify Wound Healing Center of regression in wound condition at [phone]. o If current dressing causes regression in wound condition, may D/C ordered dressing product/s and apply Normal Saline Moist Dressing daily until next Wound Healing Center / Other MD appointment. Notify Wound Healing Center of regression in wound condition at [phone]. o Please direct any NON-WOUND related issues/requests for orders to patient's Primary Care Physician o Please direct any NON-WOUND related issues/requests for orders to patient's Primary Care Physician Wound #13 Left Ischium o Continue [facility] Visits - [facility] o Continue Home Health Visits - [facility]-once weekly-please order supplies for patient's daily dressing changes. o Home Health Nurse may visit PRN to address patientos wound care needs. o Home Health Nurse may visit PRN to address patientos wound care needs. o FACE TO FACE ENCOUNTER: MEDICARE and MEDICAID PATIENTS: I certify that this patient is under my care and that I had a face-to-face encounter that meets the physician face-to-face encounter requirements with this patient on this date. The encounter with the patient was in whole or in part for the following MEDICAL CONDITION: (primary reason for Home Healthcare) MEDICAL NECESSITY: I certify, that based on my findings, NURSING services are a medically necessary home health service. HOME BOUND STATUS: I certify that my clinical findings support that this patient is homebound (i.e., Due to illness or injury, pt requires aid of supportive devices such  as crutches, cane, wheelchairs, walkers, the use of special transportation or the assistance of another person to leave their place of residence. There is a normal inability to leave the home and doing so requires considerable and taxing effort. Other absences are for medical reasons / religious services and are infrequent or of short duration when for other reasons). o FACE TO FACE ENCOUNTER: MEDICARE and MEDICAID PATIENTS: I certify that this patient is under my care and that I had a face-to-face encounter that meets the physician face-to-face encounter requirements with this patient on this date. The encounter with the patient was in whole or in part for the following MEDICAL CONDITION: (primary reason for Home Healthcare) MEDICAL NECESSITY: I certify, that based on my findings, NURSING services are a medically necessary home health service. HOME BOUND STATUS: I certify that my clinical findings support that this patient is homebound (i.e., Due to illness or injury, pt requires aid of supportive devices such as crutches, cane, wheelchairs, walkers, the use of special transportation or the assistance of another person to leave their place of residence. There is a normal inability to leave the home and doing so requires considerable and taxing effort. Other absences are for medical reasons / religious services and are infrequent or of short duration when for other reasons). o If current dressing causes regression in wound condition, may D/C ordered dressing product/s and apply Normal Saline Moist Dressing daily until next Wound Healing Center / Other MD appointment. Notify Wound Healing Center  of regression in wound condition at [phone]. o If current dressing causes regression in wound condition, may D/C ordered dressing product/s and apply Normal Saline Moist Dressing daily until next Wound Healing Center / Other MD appointment. Notify Wound Healing Center of regression in wound  condition at [phone]. o Please direct any NON-WOUND related issues/requests for orders to patient's Primary Care Physician o Please direct any NON-WOUND related issues/requests for orders to patient's Primary Care Physician Electronic Signature(s) Signed: 08/25/2019 4:22:42 PM By: Baltazar Najjar MD Signed: 08/25/2019 4:39:52 PM By: Elliot Gurney, BSN, RN, CWS, Kim RN, BSN Entered By: Elliot Gurney, BSN, RN, CWS, Kim on 08/25/2019 13:26:01 Jonathon York ([id number]) -------------------------------------------------------------------------------- Problem List Details Patient Name: Jonathon York, Jonathon York. Date of Service: 08/25/2019 1:15 PM Medical Record Number: [id number] Patient Account Number: [id number] Date of Birth/Sex: June 13, 1975 (44 y.o. M) Treating RN: Huel Coventry Primary Care Provider: FITZGERALD, DAVID Other Clinician: Referring Provider: FITZGERALD, DAVID Treating Provider/Extender: Maxwell Caul Weeks in Treatment: 5 Active Problems ICD-10 Encounter Code Description Active Date MDM Diagnosis L89.153 Pressure ulcer of sacral region, stage 3 07/21/2019 No Yes L89.313 Pressure ulcer of right buttock, stage 3 07/21/2019 No Yes L89.323 Pressure ulcer of left buttock, stage 3 07/21/2019 No Yes G82.21 Paraplegia, complete 07/21/2019 No Yes Inactive Problems Resolved Problems Electronic Signature(s) Signed: 08/25/2019 4:22:42 PM By: Baltazar Najjar MD Entered By: Baltazar Najjar on 08/25/2019 13:32:07 Jonathon York ([id number]) -------------------------------------------------------------------------------- Progress Note Details Patient Name: Jonathon York. Date of Service: 08/25/2019 1:15 PM Medical Record Number: [id number] Patient Account Number: [id number] Date of Birth/Sex: [birth year] (44 y.o. 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 They have intermittently become smaller/larger over that time. He was previously treated here and was recommended for flap closure of his wounds. Unfortunately, he never underwent those procedures because he continued to smoke and had extremely poor nutrition. He returns for followup today. He has been compliant with his alginate dressings and does have a new air mattress. His MRI did show underlying osteomyelitis at the site of his ischial wound. He would qualify for hyperbaric oxygen therapy but after submitting his work to insurance is proven to be too expensive for him. 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 He has a chronic Foley catheter. He gives a history of longstanding wounds over the sacrum and bilateral ischial areas. Indeed he was apparently in this clinic several occasions prior to it becoming a [facility]. I can only see 1 of these in 2015 at which time he had a left ischial ulcer. He was in [facility] in 2009 with a pressure ulcer of the sacrum and chronic osteomyelitis of the pelvis. At that point he had wounds on his right foot and required ray amputations. He was recently in hospital from 07/13/2019 through 07/16/2019. Initially admitted with a slight leukocytosis at 15.2 CT scan showed chronic osteomyelitis he was seen by infectious disease who did not feel he needed IV antibiotics but discharged him on Augmentin. He did not feel he needed to be treated for the osteomyelitis noted that he had been previously treated for this a year or 2 ago. He saw  general surgery they did not think any debridement was necessary. He has now at home. [facility] has [facility] they are looking for  orders. He says he is in his bed more and eating relatively well. He does not want an offloading mattress because it is difficult to transfer. He does have a Roho cushion on his wheelchair where he spends about 8 hours a day. Also of note he apparently was noted to have urinary leakage from the right ischial wound. He has an appointment to discuss this with urology at [facility] sometime in May but Dr. Sampson Goon is trying to move this up for him. 08/11/2019; the patient returns to clinic for review of pressure ulcers over the lower sacrum and bilateral ischial tuberosities. A smaller area in close juxtaposition to the sacral wound is closed today. The wounds generally look healthy. He is using a wet-to-dry dressing on these daily. He is actually putting dressings on this himself 4/28; pressure areas over the lower sacrum and bilateral ischial tuberosities. The small satellite lesion in the sacral wound is healed. He is using Dakin's wet-to-dry. He again states that he is up in his wheelchair 9 hours a day. I do not think he is willing to do any different than that Objective Constitutional Vitals Time Taken: 1:00 PM, Weight: 106 lbs, Temperature: 98.5 F, Pulse: 101 bpm, Respiratory Rate: 16 breaths/min, Blood Pressure: 114/70 mmHg. Integumentary (Hair, Skin) Wound #10 status is Open. Original cause of wound was Pressure Injury. The wound is located on the Midline Sacrum. The wound measures 2.5cm length x 5.1cm width x 0.2cm depth; 10.014cm^2 area and 2.003cm^3 volume. There is Fat Layer (Subcutaneous Tissue) Exposed exposed. There is a medium amount of serous drainage noted. The wound margin is epibole. There is large (67-100%) pink granulation within the wound bed. There is a small (1-33%) amount of necrotic tissue within the wound bed including Adherent Slough. Wound #11 status is Open. Original cause of wound was Pressure Injury. The wound is located on the Right Sacrum. The wound measures  0cm length x 0cm width x 0cm depth; 0cm^2 area and 0cm^3 volume. Wound #12 status is Open. Original cause of wound was Pressure Injury. The wound is located on the Right Ischium. The wound measures 4cm length x 5.5cm width x 0.9cm depth; 17.279cm^2 area and 15.551cm^3 volume. There is Fat Layer (Subcutaneous Tissue) Exposed exposed. There is a medium amount of serous drainage noted. The wound margin is flat and intact. There is large (67-100%) pink granulation within the wound bed. There is a small (1-33%) amount of necrotic tissue within the wound bed including Adherent Slough. Wound #13 status is Open. Original cause of wound was Pressure Injury. The wound is located on the Left Ischium. The wound measures 4cm TAICHI, Jonathon L. ([id number]) length x 6cm width x 1.3cm depth; 18.85cm^2 area and 24.504cm^3 volume. There is Fat Layer (Subcutaneous Tissue) Exposed exposed. There is tunneling at 9:00 with a maximum distance of 5cm. There is a medium amount of serous drainage noted. The wound margin is flat and intact. There is large (67-100%) pink granulation within the wound bed. There is a small (1-33%) amount of necrotic tissue within the wound bed including Adherent Slough. Assessment Active Problems ICD-10 Pressure ulcer of sacral region, stage 3 Pressure ulcer of right buttock, stage 3 Pressure ulcer of left buttock, stage 3 Paraplegia, complete Plan Wound Cleansing: Wound #10 Midline Sacrum: Clean wound with Normal Saline. Cleanse wound with mild soap and water Wound #12 Right  Ischium: Clean wound with Normal Saline. Cleanse wound with mild soap and water Wound #13 Left Ischium: Clean wound with Normal Saline. Cleanse wound with mild soap and water Anesthetic (add to Medication List): Wound #10 Midline Sacrum: Topical Lidocaine 4% cream applied to wound bed prior to debridement (In Clinic Only). Wound #12 Right Ischium: Topical Lidocaine 4% cream applied to wound bed prior to  debridement (In Clinic Only). Wound #13 Left Ischium: Topical Lidocaine 4% cream applied to wound bed prior to debridement (In Clinic Only). Primary Wound Dressing: Wound #10 Midline Sacrum: Dakins soaked gauze Wound #12 Right Ischium: Dakins soaked gauze Wound #13 Left Ischium: Dakins soaked gauze Secondary Dressing: Wound #10 Midline Sacrum: Other - gauze and abd Wound #12 Right Ischium: Other - gauze and abd Wound #13 Left Ischium: Other - gauze and abd Dressing Change Frequency: Wound #10 Midline Sacrum: Change dressing every day. - more often as needed Wound #12 Right Ischium: Change dressing every day. - more often as needed Wound #13 Left Ischium: Change dressing every day. - more often as needed Follow-up Appointments: Wound #10 Midline Sacrum: Return Appointment in 2 weeks. Wound #12 Right Ischium: Return Appointment in 2 weeks. Wound #13 Left Ischium: Return Appointment in 2 weeks. Off-Loading: Wound #10 Midline Sacrum: Turn and reposition every 2 hours Wound #12 Right Ischium: Turn and reposition every 2 hours Wound #13 Left Ischium: Turn and reposition every 2 hours Jonathon York, Jonathon York ([id number]) Additional Orders / Instructions: Wound #10 Midline Sacrum: Increase protein intake. Wound #12 Right Ischium: Increase protein intake. Wound #13 Left Ischium: Increase protein intake. Home Health: Wound #10 Midline Sacrum: Continue Home Health Visits - [place] [facility] - [facility]-once weekly-please order supplies for patient's daily dressing changes. Home Health Nurse may visit PRN to address patient s wound care needs. Home Health Nurse may visit PRN to address patient s wound care needs. FACE TO FACE ENCOUNTER: MEDICARE and MEDICAID PATIENTS: I certify that this patient is under my care and that I had a face-to-face encounter that meets the physician face-to-face encounter requirements with this patient on this  date. The encounter with the patient was in whole or in part for the following MEDICAL CONDITION: (primary reason for [place]) MEDICAL NECESSITY: I certify, that based on my findings, NURSING services are a medically necessary home health service. HOME BOUND STATUS: I certify that my clinical findings support that this patient is homebound (i.e., Due to illness or injury, pt requires aid of supportive devices such as crutches, cane, wheelchairs, walkers, the use of special transportation or the assistance of another person to leave their place of residence. There is a normal inability to leave the home and doing so requires considerable and taxing effort. Other absences are for medical reasons / religious services and are infrequent or of short duration when for other reasons). FACE TO FACE ENCOUNTER: MEDICARE and MEDICAID PATIENTS: I certify that this patient is under my care and that I had a face-to-face encounter that meets the physician face-to-face encounter requirements with this patient on this date. The encounter with the patient was in whole or in part for the following MEDICAL CONDITION: (primary reason for [place]) MEDICAL NECESSITY: I certify, that based on my findings, NURSING services are a medically necessary home health service. HOME BOUND STATUS: I certify that my clinical findings support that this patient is homebound (i.e., Due to illness or injury, pt requires aid of supportive devices such as crutches, cane,  wheelchairs, walkers, the use of special transportation or the assistance of another person to leave their place of residence. There is a normal inability to leave the home and doing so requires considerable and taxing effort. Other absences are for medical reasons / religious services and are infrequent or of short duration when for other reasons). If current dressing causes regression in wound condition, may D/C ordered dressing product/s and apply Normal  Saline Moist Dressing daily until next Wound Healing Center / Other MD appointment. Notify Wound Healing Center of regression in wound condition at [phone]. If current dressing causes regression in wound condition, may D/C ordered dressing product/s and apply Normal Saline Moist Dressing daily until next Wound Healing Center / Other MD appointment. Notify Wound Healing Center of regression in wound condition at [phone]. Please direct any NON-WOUND related issues/requests for orders to patient's Primary Care Physician Please direct any NON-WOUND related issues/requests for orders to patient's Primary Care Physician Wound #12 Right Ischium: Continue Home Health Visits - [facility] Continue Home Health Visits - [facility]-once weekly-please order supplies for patient's daily dressing changes. Home Health Nurse may visit PRN to address patient s wound care needs. Home Health Nurse may visit PRN to address patient s wound care needs. FACE TO FACE ENCOUNTER: MEDICARE and MEDICAID PATIENTS: I certify that this patient is under my care and that I had a face-to-face encounter that meets the physician face-to-face encounter requirements with this patient on this date. The encounter with the patient was in whole or in part for the following MEDICAL CONDITION: (primary reason for Home Healthcare) MEDICAL NECESSITY: I certify, that based on my findings, NURSING services are a medically necessary home health service. HOME BOUND STATUS: I certify that my clinical findings support that this patient is homebound (i.e., Due to illness or injury, pt requires aid of supportive devices such as crutches, cane, wheelchairs, walkers, the use of special transportation or the assistance of another person to leave their place of residence. There is a normal inability to leave the home and doing so requires considerable and taxing effort. Other absences are for medical reasons / religious services  and are infrequent or of short duration when for other reasons). FACE TO FACE ENCOUNTER: MEDICARE and MEDICAID PATIENTS: I certify that this patient is under my care and that I had a face-to-face encounter that meets the physician face-to-face encounter requirements with this patient on this date. The encounter with the patient was in whole or in part for the following MEDICAL CONDITION: (primary reason for Home Healthcare) MEDICAL NECESSITY: I certify, that based on my findings, NURSING services are a medically necessary home health service. HOME BOUND STATUS: I certify that my clinical findings support that this patient is homebound (i.e., Due to illness or injury, pt requires aid of supportive devices such as crutches, cane, wheelchairs, walkers, the use of special transportation or the assistance of another person to leave their place of residence. There is a normal inability to leave the home and doing so requires considerable and taxing effort. Other absences are for medical reasons / religious services and are infrequent or of short duration when for other reasons). If current dressing causes regression in wound condition, may D/C ordered dressing product/s and apply Normal Saline Moist Dressing daily until next Wound Healing Center / Other MD appointment. Notify Wound Healing Center of regression in wound condition at [phone]. If current dressing causes regression in wound condition, may D/C ordered dressing product/s and apply Normal  Saline Moist Dressing daily until next Wound Healing Center / Other MD appointment. Notify Wound Healing Center of regression in wound condition at [phone]. Please direct any NON-WOUND related issues/requests for orders to patient's Primary Care Physician Please direct any NON-WOUND related issues/requests for orders to patient's Primary Care Physician Wound #13 Left Ischium: Continue Home Health Visits - [facility] Continue Home Health  Visits - [facility]-once weekly-please order supplies for patient's daily dressing changes. Home Health Nurse may visit PRN to address patient s wound care needs. Home Health Nurse may visit PRN to address patient s wound care needs. FACE TO FACE ENCOUNTER: MEDICARE and MEDICAID PATIENTS: I certify that this patient is under my care and that I had a face-to-face encounter that meets the physician face-to-face encounter requirements with this patient on this date. The encounter with the patient was in whole or in part for the following MEDICAL CONDITION: (primary reason for Home Healthcare) MEDICAL NECESSITY: I certify, that based on my findings, NURSING services are a medically necessary home health service. HOME BOUND STATUS: I certify that my clinical findings support that this patient is homebound (i.e., Due to illness or injury, pt requires aid of supportive devices such as crutches, cane, wheelchairs, walkers, the use of special transportation or the assistance of another person to leave their place of residence. There is a normal inability to leave the home and doing so requires considerable and taxing effort. Other absences are for medical reasons / religious services and are infrequent or of short duration when for other reasons). FACE TO FACE ENCOUNTER: MEDICARE and MEDICAID PATIENTS: I certify that this patient is under my care and that I had a face-to-face encounter that meets the physician face-to-face encounter requirements with this patient on this date. The encounter with the patient was in whole or in part for the following MEDICAL CONDITION: (primary reason for Home Healthcare) MEDICAL NECESSITY: I certify, that based on my findings, NURSING services are a medically necessary home health service. HOME BOUND STATUS: I certify that my clinical findings support that this patient is homebound (i.e., Due to illness or injury, pt requires aid of supportive devices such as  crutches, cane, wheelchairs, walkers, the use Jonathon York, Jonathon York. ([id number]) of special transportation or the assistance of another person to leave their place of residence. There is a normal inability to leave the home and doing so requires considerable and taxing effort. Other absences are for medical reasons / religious services and are infrequent or of short duration when for other reasons). If current dressing causes regression in wound condition, may D/C ordered dressing product/s and apply Normal Saline Moist Dressing daily until next Wound Healing Center / Other MD appointment. Notify Wound Healing Center of regression in wound condition at [phone]. If current dressing causes regression in wound condition, may D/C ordered dressing product/s and apply Normal Saline Moist Dressing daily until next Wound Healing Center / Other MD appointment. Notify Wound Healing Center of regression in wound condition at [phone]. Please direct any NON-WOUND related issues/requests for orders to patient's Primary Care Physician Please direct any NON-WOUND related issues/requests for orders to patient's Primary Care Physician 1. At this point I do not think we can benefit from changing dressings. He is not willing to offload this any more than he has i.e. about 9 hours a day. 2. He expressed frustration to our case manager that he feels that other people who are similar to him have healed these wounds although I doubt  it was on there buttocks or lower back sitting in a wheelchair 9 hours a day. 3. I do not know that I can justify anything further in terms of dressing complexity given the absence of adequate pressure relief. Electronic Signature(s) Signed: 08/25/2019 4:22:42 PM By: Baltazar Najjar MD Entered By: Baltazar Najjar on 08/25/2019 13:35:44 Jonathon York ([id number]) -------------------------------------------------------------------------------- SuperBill Details Patient Name: Jonathon York Date of Service: 08/25/2019 Medical Record Number: [id number] Patient Account Number: [id number] Date of Birth/Sex: 05/26/1975 (44 y.o. 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

## 2019-08-26 NOTE — Progress Notes (Addendum)
SUHAAN, PERLEBERG ([id number]) Visit Report for 08/25/2019 Arrival Information Details Patient Name: Jonathon York, Jonathon York. Date of Service: 08/25/2019 1:15 PM Medical Record Number: [id number] Patient Account Number: [id number] Date of Birth/Sex: [birth year] (44 y.o. M) Treating RN: Huel Coventry Primary Care Jakhia Buxton: FITZGERALD, DAVID Other Clinician: Referring Elvis Laufer: FITZGERALD, DAVID Treating Ande Therrell/Extender: Maxwell Caul Weeks in Treatment: 5 Visit Information History Since Last Visit Added or deleted any medications: No Patient Arrived: Wheel Chair Any new allergies or adverse reactions: No Arrival Time: 12:58 Had a fall or experienced change in No Accompanied By: self activities of daily living that may affect Transfer Assistance: None risk of falls: Patient Identification Verified: Yes Signs or symptoms of abuse/neglect since last visito No Secondary Verification Process Completed: Yes Hospitalized since last visit: No Implantable device outside of the clinic excluding No cellular tissue based products placed in the center since last visit: Has Dressing in Place as Prescribed: Yes Pain Present Now: Yes Electronic Signature(s) Signed: 08/25/2019 3:45:27 PM By: Dayton Martes RCP, RRT, CHT Entered By: Weyman Rodney, Lucio Edward on 08/25/2019 13:00:18 Jonathon Leeds ([id number]) -------------------------------------------------------------------------------- Clinic Level of Care Assessment Details Patient Name: Jonathon York, Jonathon L. Date of Service: 08/25/2019 1:15 PM Medical Record Number: [id number] Patient Account Number: [id number] Date of Birth/Sex: 18-Jan-1976 (44 y.o. M) Treating RN: Huel Coventry Primary Care Aly Seidenberg: FITZGERALD, DAVID Other Clinician: Referring Wilmar Prabhakar: FITZGERALD, DAVID Treating Breonia Kirstein/Extender: Altamese [place] in Treatment: 5 Clinic Level of Care Assessment Items TOOL 4 Quantity Score []  - Use when only an EandM is  performed on FOLLOW-UP visit 0 ASSESSMENTS - Nursing Assessment / Reassessment X - Reassessment of Co-morbidities (includes updates in patient status) 1 10 X- 1 5 Reassessment of Adherence to Treatment Plan ASSESSMENTS - Wound and Skin Assessment / Reassessment []  - Simple Wound Assessment / Reassessment - one wound 0 X- 3 5 Complex Wound Assessment / Reassessment - multiple wounds []  - 0 Dermatologic / Skin Assessment (not related to wound area) ASSESSMENTS - Focused Assessment []  - Circumferential Edema Measurements - multi extremities 0 []  - 0 Nutritional Assessment / Counseling / Intervention []  - 0 Lower Extremity Assessment (monofilament, tuning fork, pulses) []  - 0 Peripheral Arterial Disease Assessment (using hand held doppler) ASSESSMENTS - Ostomy and/or Continence Assessment and Care []  - Incontinence Assessment and Management 0 []  - 0 Ostomy Care Assessment and Management (repouching, etc.) PROCESS - Coordination of Care X - Simple Patient / Family Education for ongoing care 1 15 []  - 0 Complex (extensive) Patient / Family Education for ongoing care []  - 0 Staff obtains , Records, Test Results / Process Orders []  - 0 Staff telephones HHA, Nursing Homes / Clarify orders / etc []  - 0 Routine Transfer to another Facility (non-emergent condition) []  - 0 Routine Hospital Admission (non-emergent condition) []  - 0 New Admissions / / Ordering NPWT, Apligraf, etc. []  - 0 Emergency Hospital Admission (emergent condition) X- 1 10 Simple Discharge Coordination []  - 0 Complex (extensive) Discharge Coordination PROCESS - Special Needs []  - Pediatric / Minor Patient Management 0 []  - 0 Isolation Patient Management []  - 0 Hearing / Language / Visual special needs []  - 0 Assessment of Community assistance (transportation, D/C planning, etc.) []  - 0 Additional assistance / Altered mentation []  - 0 Support Surface(s) Assessment (bed,  cushion, seat, etc.) INTERVENTIONS - Wound Cleansing / Measurement Shrider, Rana L. ( ) []  - 0 Simple Wound Cleansing - one wound X- 3 5 Complex Wound Cleansing - multiple wounds X-  1 5 Wound Imaging (photographs - any number of wounds) []  - 0 Wound Tracing (instead of photographs) []  - 0 Simple Wound Measurement - one wound X- 3 5 Complex Wound Measurement - multiple wounds INTERVENTIONS - Wound Dressings []  - Small Wound Dressing one or multiple wounds 0 []  - 0 Medium Wound Dressing one or multiple wounds X- 3 20 Large Wound Dressing one or multiple wounds []  - 0 Application of Medications - topical []  - 0 Application of Medications - injection INTERVENTIONS - Miscellaneous []  - External ear exam 0 []  - 0 Specimen Collection (cultures, biopsies, blood, body fluids, etc.) []  - 0 Specimen(s) / Culture(s) sent or taken to Lab for analysis []  - 0 Patient Transfer (multiple staff / Civil Service fast streamer / Similar devices) []  - 0 Simple Staple / Suture removal (25 or less) []  - 0 Complex Staple / Suture removal (26 or more) []  - 0 Hypo / Hyperglycemic Management (close monitor of Blood Glucose) []  - 0 Ankle / Brachial Index (ABI) - do not check if billed separately X- 1 5 Vital Signs Has the patient been seen at the hospital within the last three years: Yes Total Score: 155 Level Of Care: New/Established - Level 4 Electronic Signature(s) Signed: 08/25/2019 4:39:52 PM By: Gretta Cool, BSN, RN, CWS, Kim RN, BSN Entered By: Gretta Cool, BSN, RN, CWS, Kim on 08/25/2019 13:27:14 Jonathon York ([id number]) -------------------------------------------------------------------------------- Encounter Discharge Information Details Patient Name: Jonathon York, Jonathon York. Date of Service: 08/25/2019 1:15 PM Medical Record Number: [id number] Patient Account Number: [id number] Date of Birth/Sex: 1975-06-13 (44 y.o. M) Treating RN: Cornell Barman Primary Care Merrill Deanda: FITZGERALD, DAVID Other  Clinician: Referring Alesia Oshields: FITZGERALD, DAVID Treating Erich Kochan/Extender: Tito Dine in Treatment: 5 Encounter Discharge Information Items Discharge Condition: Stable Ambulatory Status: Wheelchair Discharge Destination: Home Transportation: Private Auto Accompanied By: self Schedule Follow-up Appointment: Yes Clinical Summary of Care: Electronic Signature(s) Signed: 08/25/2019 4:39:52 PM By: Gretta Cool, BSN, RN, CWS, Kim RN, BSN Entered By: Gretta Cool, BSN, RN, CWS, Kim on 08/25/2019 13:28:17 Jonathon York ([id number]) -------------------------------------------------------------------------------- Lower Extremity Assessment Details Patient Name: Jonathon York, KOHEN. Date of Service: 08/25/2019 1:15 PM Medical Record Number: [id number] Patient Account Number: [id number] Date of Birth/Sex: [birth year] (44 y.o. M) Treating RN: Army Melia Primary Care Teena Mangus: FITZGERALD, DAVID Other Clinician: Referring Koni Kannan: FITZGERALD, DAVID Treating Reeda Soohoo/Extender: Ricard Dillon Weeks in Treatment: 5 Electronic Signature(s) Signed: 08/25/2019 2:17:21 PM By: Army Melia Entered By: Army Melia on 08/25/2019 13:15:30 Jonathon York ([id number]) -------------------------------------------------------------------------------- Multi Wound Chart Details Patient Name: Jonathon York. Date of Service: 08/25/2019 1:15 PM Medical Record Number: [id number] Patient Account Number: [id number] Date of Birth/Sex: [birth year] (44 y.o. M) Treating RN: Cornell Barman Primary Care Mouna Yager: FITZGERALD, DAVID Other Clinician: Referring Trilby Way: FITZGERALD, DAVID Treating Cannen Dupras/Extender: Ricard Dillon Weeks in Treatment: 5 Vital Signs Height(in): Pulse(bpm): 101 Weight(lbs): 106 Blood Pressure(mmHg): 114/70 Body Mass Index(BMI): Temperature(F): 98.5 Respiratory Rate(breaths/min): 16 Photos: [11:No Photos] Wound Location: Midline Sacrum Right Sacrum Right Ischium Wounding  Event: Pressure Injury Pressure Injury Pressure Injury Primary Etiology: Pressure Ulcer Pressure Ulcer Pressure Ulcer Comorbid History: Paraplegia N/A Paraplegia Date Acquired: 04/30/2011 06/28/2019 04/29/2012 Weeks of Treatment: 5 5 5  Wound Status: Open Open Open Measurements L x W x D (cm) 2.5x5.1x0.2 0x0x0 4x5.5x0.9 Area (cm) : 10.014 0 17.279 Volume (cm) : 2.003 0 15.551 % Reduction in Area: 44.10% 100.00% 38.90% % Reduction in Volume: 44.10% 100.00% 21.40% Tunneling: N/A N/A N/A Classification: Category/Stage III Category/Stage II Category/Stage III Exudate Amount: Medium N/A Medium Exudate Type:  Serous N/A Serous Exudate Color: amber N/A amber Wound Margin: Epibole N/A Flat and Intact Granulation Amount: Large (67-100%) N/A Large (67-100%) Granulation Quality: Pink N/A Pink Necrotic Amount: Small (1-33%) N/A Small (1-33%) Exposed Structures: Fat Layer (Subcutaneous Tissue) N/A Fat Layer (Subcutaneous Tissue) Exposed: Yes Exposed: Yes Fascia: No Fascia: No Tendon: No Tendon: No Muscle: No Muscle: No Joint: No Joint: No Bone: No Bone: No Epithelialization: None N/A None Wound Number: 13 N/A N/A Photos: N/A N/A Wound Location: Left Ischium N/A N/A Jonathon York, Jonathon L. ([id number]) Wounding Event: Pressure Injury N/A N/A Primary Etiology: Pressure Ulcer N/A N/A Comorbid History: Paraplegia N/A N/A Date Acquired: 04/29/2013 N/A N/A Weeks of Treatment: 5 N/A N/A Wound Status: Open N/A N/A Measurements L x W x D (cm) 4x6x1.3 N/A N/A Area (cm) : 18.85 N/A N/A Volume (cm) : 24.504 N/A N/A % Reduction in Area: -100.00% N/A N/A % Reduction in Volume: -100.00% N/A N/A Position 1 (o'clock): 9 Maximum Distance 1 (cm): 5 Tunneling: Yes N/A N/A Classification: Category/Stage III N/A N/A Exudate Amount: Medium N/A N/A Exudate Type: Serous N/A N/A Exudate Color: amber N/A N/A Wound Margin: Flat and Intact N/A N/A Granulation Amount: Large (67-100%) N/A N/A Granulation Quality:  Pink N/A N/A Necrotic Amount: Small (1-33%) N/A N/A Exposed Structures: Fat Layer (Subcutaneous Tissue) N/A N/A Exposed: Yes Fascia: No Tendon: No Muscle: No Joint: No Bone: No Epithelialization: None N/A N/A Treatment Notes Wound #10 (Midline Sacrum) Notes wet-to-dry Wound #12 (Right Ischium) Notes wet-to-dry Wound #13 (Left Ischium) Notes wet-to-dry Electronic Signature(s) Signed: 08/25/2019 4:22:42 PM By: Baltazar Najjarobson, Michael MD Entered By: Baltazar Najjarobson, Michael on 08/25/2019 13:32:19 Jonathon York, Jonathon L. ([id number]) -------------------------------------------------------------------------------- Multi-Disciplinary Care Plan Details Patient Name: Jonathon York, Jonathon L. Date of Service: 08/25/2019 1:15 PM Medical Record Number: [id number] Patient Account Number: [id number] Date of Birth/Sex: 1975-05-05 (44 y.o. M) Treating RN: Huel CoventryWoody, Kim Primary Care Kimmy Totten: FITZGERALD, DAVID Other Clinician: Referring Wayde Gopaul: FITZGERALD, DAVID Treating Khrystal Jeanmarie/Extender: Altamese CarolinaOBSON, MICHAEL G Weeks in Treatment: 5 Active Inactive Electronic Signature(s) Signed: 11/09/2019 1:40:29 PM By: Elliot GurneyWoody, BSN, RN, CWS, Kim RN, BSN Previous Signature: 08/25/2019 4:39:52 PM Version By: Elliot GurneyWoody, BSN, RN, CWS, Kim RN, BSN Entered By: Elliot GurneyWoody, BSN, RN, CWS, Kim on 11/09/2019 13:40:28 Jonathon York, Cornellius L. ([id number]) -------------------------------------------------------------------------------- Pain Assessment Details Patient Name: Jonathon York, Jonathon L. Date of Service: 08/25/2019 1:15 PM Medical Record Number: [id number] Patient Account Number: [id number] Date of Birth/Sex: 1975-05-05 (44 y.o. M) Treating RN: Rodell PernaScott, Dajea Primary Care Raechal Raben: FITZGERALD, DAVID Other Clinician: Referring Mariusz Jubb: FITZGERALD, DAVID Treating Marisol Giambra/Extender: Maxwell CaulOBSON, MICHAEL G Weeks in Treatment: 5 Active Problems Location of Pain Severity and Description of Pain Patient Has Paino Yes Site Locations Pain Location: Pain in Ulcers Rate the  pain. Current Pain Level: 6 Pain Management and Medication Current Pain Management: Electronic Signature(s) Signed: 08/25/2019 2:17:21 PM By: Rodell PernaScott, Dajea Entered By: Rodell PernaScott, Dajea on 08/25/2019 13:06:01 Jonathon York, Araceli L. ([id number]) -------------------------------------------------------------------------------- Patient/Caregiver Education Details Patient Name: Jonathon York, Nikholas L. Date of Service: 08/25/2019 1:15 PM Medical Record Number: [id number] Patient Account Number: [id number] Date of Birth/Gender: 1975-05-05 (44 y.o. M) Treating RN: Huel CoventryWoody, Kim Primary Care Physician: FITZGERALD, DAVID Other Clinician: Referring Physician: FITZGERALD, DAVID Treating Physician/Extender: Altamese CarolinaOBSON, MICHAEL G Weeks in Treatment: 5 Education Assessment Education Provided To: Patient Education Topics Provided Pressure: Handouts: Pressure Ulcers: Care and Offloading Methods: Demonstration, Explain/Verbal Responses: State content correctly Electronic Signature(s) Signed: 08/25/2019 4:39:52 PM By: Elliot GurneyWoody, BSN, RN, CWS, Kim RN, BSN Entered By: Elliot GurneyWoody, BSN, RN, CWS, Kim on 08/25/2019 13:27:36 Jonathon York, Rami L. ([id number]) -------------------------------------------------------------------------------- Wound Assessment  Details Patient Name: ADEEB, KONECNY. Date of Service: 08/25/2019 1:15 PM Medical Record Number: [id number] Patient Account Number: [id number] Date of Birth/Sex: 03-30-1976 (44 y.o. M) Treating RN: Rodell Perna Primary Care Maymie Brunke: FITZGERALD, DAVID Other Clinician: Referring Gurnie Duris: FITZGERALD, DAVID Treating Dennisha Mouser/Extender: Maxwell Caul Weeks in Treatment: 5 Wound Status Wound Number: 10 Primary Etiology: Pressure Ulcer Wound Location: Midline Sacrum Wound Status: Open Wounding Event: Pressure Injury Comorbid History: Paraplegia Date Acquired: 04/30/2011 Weeks Of Treatment: 5 Clustered Wound: No Photos Wound Measurements Length: (cm) 2.5 Width: (cm) 5.1 Depth:  (cm) 0.2 Area: (cm) 10.014 Volume: (cm) 2.003 % Reduction in Area: 44.1% % Reduction in Volume: 44.1% Epithelialization: None Wound Description Classification: Category/Stage III Wound Margin: Epibole Exudate Amount: Medium Exudate Type: Serous Exudate Color: amber Foul Odor After Cleansing: No Slough/Fibrino Yes Wound Bed Granulation Amount: Large (67-100%) Exposed Structure Granulation Quality: Pink Fascia Exposed: No Necrotic Amount: Small (1-33%) Fat Layer (Subcutaneous Tissue) Exposed: Yes Necrotic Quality: Adherent Slough Tendon Exposed: No Muscle Exposed: No Joint Exposed: No Bone Exposed: No Electronic Signature(s) Signed: 08/25/2019 2:17:21 PM By: Rodell Perna Entered By: Rodell Perna on 08/25/2019 13:12:32 Jonathon Leeds ([id number]) -------------------------------------------------------------------------------- Wound Assessment Details Patient Name: Jonathon Leeds. Date of Service: 08/25/2019 1:15 PM Medical Record Number: [id number] Patient Account Number: [id number] Date of Birth/Sex: Nov 15, 1975 (44 y.o. M) Treating RN: Rodell Perna Primary Care Jakeline Dave: FITZGERALD, DAVID Other Clinician: Referring Eldra Word: FITZGERALD, DAVID Treating Elishah Ashmore/Extender: Maxwell Caul Weeks in Treatment: 5 Wound Status Wound Number: 11 Primary Etiology: Pressure Ulcer Wound Location: Right Sacrum Wound Status: Open Wounding Event: Pressure Injury Date Acquired: 06/28/2019 Weeks Of Treatment: 5 Clustered Wound: No Wound Measurements Length: (cm) Width: (cm) Depth: (cm) Area: (cm) Volume: (cm) 0 % Reduction in Area: 100% 0 % Reduction in Volume: 100% 0 0 0 Wound Description Classification: Category/Stage II Electronic Signature(s) Signed: 08/25/2019 2:17:21 PM By: Rodell Perna Entered By: Rodell Perna on 08/25/2019 13:11:18 Jonathon Leeds ([id number]) -------------------------------------------------------------------------------- Wound  Assessment Details Patient Name: Jonathon Leeds. Date of Service: 08/25/2019 1:15 PM Medical Record Number: [id number] Patient Account Number: [id number] Date of Birth/Sex: 06-20-1975 (45 y.o. M) Treating RN: Rodell Perna Primary Care Elzada Pytel: FITZGERALD, DAVID Other Clinician: Referring Lind Ausley: FITZGERALD, DAVID Treating Daronte Shostak/Extender: Maxwell Caul Weeks in Treatment: 5 Wound Status Wound Number: 12 Primary Etiology: Pressure Ulcer Wound Location: Right Ischium Wound Status: Open Wounding Event: Pressure Injury Comorbid History: Paraplegia Date Acquired: 04/29/2012 Weeks Of Treatment: 5 Clustered Wound: No Photos Wound Measurements Length: (cm) 4 Width: (cm) 5.5 Depth: (cm) 0.9 Area: (cm) 17.279 Volume: (cm) 15.551 % Reduction in Area: 38.9% % Reduction in Volume: 21.4% Epithelialization: None Wound Description Classification: Category/Stage III Wound Margin: Flat and Intact Exudate Amount: Medium Exudate Type: Serous Exudate Color: amber Foul Odor After Cleansing: No Slough/Fibrino Yes Wound Bed Granulation Amount: Large (67-100%) Exposed Structure Granulation Quality: Pink Fascia Exposed: No Necrotic Amount: Small (1-33%) Fat Layer (Subcutaneous Tissue) Exposed: Yes Necrotic Quality: Adherent Slough Tendon Exposed: No Muscle Exposed: No Joint Exposed: No Bone Exposed: No Electronic Signature(s) Signed: 08/25/2019 2:17:21 PM By: Rodell Perna Entered By: Rodell Perna on 08/25/2019 13:14:44 Jonathon Leeds ([id number]) -------------------------------------------------------------------------------- Wound Assessment Details Patient Name: Jonathon Leeds. Date of Service: 08/25/2019 1:15 PM Medical Record Number: [id number] Patient Account Number: [id number] Date of Birth/Sex: Feb 15, 1976 (44 y.o. M) Treating RN: Rodell Perna Primary Care Burrell Hodapp: FITZGERALD, DAVID Other Clinician: Referring Yandel Zeiner: FITZGERALD, DAVID Treating  Wadie Mattie/Extender: Maxwell Caul Weeks in Treatment: 5 Wound Status Wound Number: 13 Primary  Etiology: Pressure Ulcer Wound Location: Left Ischium Wound Status: Open Wounding Event: Pressure Injury Comorbid History: Paraplegia Date Acquired: 04/29/2013 Weeks Of Treatment: 5 Clustered Wound: No Photos Wound Measurements Length: (cm) 4 Width: (cm) 6 Depth: (cm) 1.3 Area: (cm) 18.85 Volume: (cm) 24.504 % Reduction in Area: -100% % Reduction in Volume: -100% Epithelialization: None Tunneling: Yes Position (o'clock): 9 Maximum Distance: (cm) 5 Wound Description Classification: Category/Stage III Wound Margin: Flat and Intact Exudate Amount: Medium Exudate Type: Serous Exudate Color: amber Foul Odor After Cleansing: No Slough/Fibrino Yes Wound Bed Granulation Amount: Large (67-100%) Exposed Structure Granulation Quality: Pink Fascia Exposed: No Necrotic Amount: Small (1-33%) Fat Layer (Subcutaneous Tissue) Exposed: Yes Necrotic Quality: Adherent Slough Tendon Exposed: No Muscle Exposed: No Joint Exposed: No Bone Exposed: No Electronic Signature(s) Signed: 08/25/2019 2:17:21 PM By: Rodell Perna Entered By: Rodell Perna on 08/25/2019 13:15:03 Jonathon Leeds ([id number]) -------------------------------------------------------------------------------- Vitals Details Patient Name: Jonathon Leeds. Date of Service: 08/25/2019 1:15 PM Medical Record Number: [id number] Patient Account Number: [id number] Date of Birth/Sex: 07-06-1975 (44 y.o. M) Treating RN: Huel Coventry Primary Care Zakaria Fromer: FITZGERALD, DAVID Other Clinician: Referring Maximiliano Cromartie: FITZGERALD, DAVID Treating Julisa Flippo/Extender: Maxwell Caul Weeks in Treatment: 5 Vital Signs Time Taken: 13:00 Temperature (F): 98.5 Weight (lbs): 106 Pulse (bpm): 101 Respiratory Rate (breaths/min): 16 Blood Pressure (mmHg): 114/70 Reference Range: 80 - 120 mg / dl Electronic Signature(s) Signed: 08/25/2019  3:45:27 PM By: Dayton Martes RCP, RRT, CHT Entered By: Dayton Martes on 08/25/2019 13:00:48

## 2019-08-30 ENCOUNTER — Emergency Department: Payer: Medicare Other

## 2019-08-30 ENCOUNTER — Encounter: Payer: Self-pay | Admitting: Emergency Medicine

## 2019-08-30 ENCOUNTER — Emergency Department
Admission: EM | Admit: 2019-08-30 | Discharge: 2019-08-30 | Disposition: A | Discharge status: Home / Self Care | Payer: Medicare Other | Attending: Emergency Medicine | Admitting: Emergency Medicine

## 2019-08-30 ENCOUNTER — Other Ambulatory Visit: Payer: Self-pay

## 2019-08-30 DIAGNOSIS — R1031 Right lower quadrant pain: Secondary | ICD-10-CM | POA: Diagnosis not present

## 2019-08-30 DIAGNOSIS — Z79899 Other long term (current) drug therapy: Secondary | ICD-10-CM | POA: Insufficient documentation

## 2019-08-30 DIAGNOSIS — R109 Unspecified abdominal pain: Secondary | ICD-10-CM

## 2019-08-30 DIAGNOSIS — F1210 Cannabis abuse, uncomplicated: Secondary | ICD-10-CM | POA: Insufficient documentation

## 2019-08-30 DIAGNOSIS — R112 Nausea with vomiting, unspecified: Secondary | ICD-10-CM | POA: Insufficient documentation

## 2019-08-30 DIAGNOSIS — Z87891 Personal history of nicotine dependence: Secondary | ICD-10-CM | POA: Diagnosis not present

## 2019-08-30 LAB — CBC
HCT: 38.4 % — ABNORMAL LOW (ref 39.0–52.0)
Hemoglobin: 11.6 g/dL — ABNORMAL LOW (ref 13.0–17.0)
MCH: 20.2 pg — ABNORMAL LOW (ref 26.0–34.0)
MCHC: 30.2 g/dL (ref 30.0–36.0)
MCV: 67 fL — ABNORMAL LOW (ref 80.0–100.0)
Platelets: 493 10*3/uL — ABNORMAL HIGH (ref 150–400)
RBC: 5.73 MIL/uL (ref 4.22–5.81)
RDW: 21.3 % — ABNORMAL HIGH (ref 11.5–15.5)
WBC: 10.2 10*3/uL (ref 4.0–10.5)
nRBC: 0 % (ref 0.0–0.2)

## 2019-08-30 LAB — COMPREHENSIVE METABOLIC PANEL
ALT: 12 U/L (ref 0–44)
AST: 16 U/L (ref 15–41)
Albumin: 3.7 g/dL (ref 3.5–5.0)
Alkaline Phosphatase: 75 U/L (ref 38–126)
Anion gap: 13 (ref 5–15)
BUN: 14 mg/dL (ref 6–20)
CO2: 21 mmol/L — ABNORMAL LOW (ref 22–32)
Calcium: 8.9 mg/dL (ref 8.9–10.3)
Chloride: 105 mmol/L (ref 98–111)
Creatinine, Ser: 0.42 mg/dL — ABNORMAL LOW (ref 0.61–1.24)
GFR calc Af Amer: >60 mL/min (ref >60)
GFR calc non Af Amer: >60 mL/min (ref >60)
Glucose, Bld: 124 mg/dL — ABNORMAL HIGH (ref 70–99)
Potassium: 3.6 mmol/L (ref 3.5–5.1)
Sodium: 139 mmol/L (ref 135–145)
Total Bilirubin: 0.8 mg/dL (ref 0.3–1.2)
Total Protein: 7.8 g/dL (ref 6.5–8.1)

## 2019-08-30 LAB — LIPASE, BLOOD: Lipase: 20 U/L (ref 11–51)

## 2019-08-30 MED ORDER — IOHEXOL 9 MG/ML PO SOLN
500.0000 mL | Freq: Two times a day (BID) | ORAL | Status: DC | PRN
  Administered 2019-08-30: 1000 mL via ORAL
  Filled 2019-08-30 (×2): qty 500

## 2019-08-30 MED ORDER — ONDANSETRON 4 MG PO TBDP
4.0000 mg | ORAL_TABLET | Freq: Once | ORAL | Status: AC
  Administered 2019-08-30: 4 mg via ORAL
  Filled 2019-08-30: qty 1

## 2019-08-30 MED ORDER — SODIUM CHLORIDE 0.9% FLUSH: 3.0000 mL | Freq: Once | INTRAVENOUS | Status: DC

## 2019-08-30 MED ORDER — ONDANSETRON 4 MG PO TBDP
ORAL_TABLET | ORAL | Status: AC
  Filled 2019-08-30: qty 1

## 2019-08-30 MED ORDER — MORPHINE SULFATE (PF) 4 MG/ML IV SOLN
4.0000 mg | Freq: Once | INTRAVENOUS | Status: AC
  Administered 2019-08-30: 4 mg via INTRAVENOUS
  Filled 2019-08-30: qty 1

## 2019-08-30 MED ORDER — SODIUM CHLORIDE 0.9 % IV BOLUS
1000.0000 mL | Freq: Once | INTRAVENOUS | Status: AC
  Administered 2019-08-30: 1000 mL via INTRAVENOUS

## 2019-08-30 MED ORDER — ONDANSETRON 4 MG PO TBDP: 4.0000 mg | ORAL_TABLET | Freq: Once | ORAL | Status: DC

## 2019-08-30 MED ORDER — ONDANSETRON HCL 4 MG/2ML IJ SOLN
4.0000 mg | Freq: Once | INTRAMUSCULAR | Status: AC
  Administered 2019-08-30: 4 mg via INTRAVENOUS
  Filled 2019-08-30: qty 2

## 2019-08-30 MED ORDER — IOHEXOL 300 MG/ML  SOLN
75.0000 mL | Freq: Once | INTRAMUSCULAR | Status: AC | PRN
  Administered 2019-08-30: 19:00:00 75 mL via INTRAVENOUS
  Filled 2019-08-30: qty 75

## 2019-08-30 MED ORDER — OXYCODONE-ACETAMINOPHEN 5-325 MG PO TABS: 1.0000 | ORAL_TABLET | ORAL | 0 refills | Status: DC | PRN

## 2019-08-30 MED ORDER — ONDANSETRON 4 MG PO TBDP
4.0000 mg | ORAL_TABLET | Freq: Three times a day (TID) | ORAL | 0 refills | Status: DC | PRN
Start: 2019-08-30 — End: 2019-10-27

## 2019-08-30 NOTE — ED Provider Notes (Signed)
[facility] Emergency Department Provider Note  Time seen: 4:03 PM  I have reviewed the triage vital signs and the nursing notes.   HISTORY  Chief Complaint Abdominal Pain and Emesis   HPI Jonathon York is a 44 y.o. male with a past medical history of anxiety, paraplegic, chronic Foley, presents to the emergency department for abdominal pain nausea and vomiting.  According to the patient this is a recurrent issue that occurs every several months per patient states he will get fairly diffuse abdominal pain although worse in the right lower quadrant.  States nausea vomiting but denies any diarrhea.  Denies any fever.  Largely negative review of systems otherwise.  Describes his abdominal pain as moderate fairly diffuse but more so in the right lower quadrant.   Past Medical History:  Diagnosis Date  . Anxiety   . Decubitus ulcer   . Depression   . Neurogenic bladder   . Osteomyelitis (HCC)   . 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 Chronic osteomyelitis (HCC)   . Constipation   . Iron deficiency anemia   . Sacral decubitus ulcer, stage IV (HCC) 07/13/2019  . Intractable pain 11/26/2018  . Anxiety 04/22/2018  . Acute osteomyelitis of right foot (HCC) 03/18/2018  . Pressure injury of skin 03/18/2018  . 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 Protein-calorie malnutrition, severe 11/20/2016  . Herpes simplex infection of penis   . Decubitus ulcer 11/19/2016  . 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 Decubitus ulcer of sacral region   . Sepsis (HCC) 04/15/2016  . 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 Pressure ulcer stage III 04/30/2015  . Sterile pyuria 04/30/2015  . Paraplegia (HCC) 04/30/2015  . Neurogenic bladder 04/30/2015  . 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 Moderate malnutrition (HCC)  04/28/2015    Past Surgical History:  Procedure Laterality Date  . AMPUTATION Right 03/19/2018   Procedure: 5th Metatarsal Resection;  Surgeon: Recardo Evangelist, DPM;  Location: [facility] ORS;  Service: Podiatry;  Laterality: Right;  . 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 IRRIGATION AND DEBRIDEMENT BUTTOCKS    . TOE AMPUTATION Right     Prior to Admission medications   Medication Sig Start Date End Date Taking? Authorizing Provider  ascorbic acid (VITAMIN C) 500 MG tablet Take 1 tablet (500 mg total) by mouth 2 (two) times daily. 07/16/19   Alford Highland, MD  docusate sodium (COLACE) 100 MG capsule Take 1 capsule (100 mg total) by mouth 2 (two) times daily as needed for mild constipation. 07/16/19   Alford Highland, MD  feeding supplement, ENSURE ENLIVE, (ENSURE ENLIVE) LIQD Take 237 mLs by mouth 3 (three) times daily between meals. 07/16/19   Alford Highland, MD  ondansetron (ZOFRAN) 4 MG tablet Take 1 tablet (4 mg total) by mouth every 6 (six) hours as needed for nausea. 07/16/19   Alford Highland, MD  traMADol (ULTRAM) 50 MG tablet Take 1 tablet (50 mg total) by mouth every 6 (six) hours as needed for moderate pain or severe pain. 07/16/19   Alford Highland, MD    Allergies  Allergen Reactions  . Orange Fruit [Citrus] Other (See Comments)    Blisters only from ORANGES!!! Patient is not allergic from all  citrus    Family History  Problem Relation Age of Onset  . Lymphoma Sister     Social History Social History   Tobacco Use  . Smoking status: Former Smoker    Packs/day: 0.50    Types: Cigarettes  . Smokeless tobacco: Never Used  Substance Use Topics  . Alcohol use: No  . Drug use: Yes    Types: Marijuana    Comment: 2 days ago    Review of Systems Constitutional: Negative for fever Cardiovascular: Negative for chest pain. Respiratory: Negative for  shortness of breath. Gastrointestinal: Positive for abdominal pain, worse in the right lower quadrant.  Positive for vomiting.  Negative for diarrhea. Genitourinary: Chronic indwelling Foley catheter Musculoskeletal: Negative for musculoskeletal complaints Neurological: Negative for headache All other ROS negative  ____________________________________________   PHYSICAL EXAM:  VITAL SIGNS: ED Triage Vitals  Enc Vitals Group     BP 08/30/19 1531 130/83     Pulse Rate 08/30/19 1531 69     Resp 08/30/19 1531 18     Temp 08/30/19 1531 98.5 F (36.9 C)     Temp Source 08/30/19 1531 Oral     SpO2 08/30/19 1531 100 %     Weight 08/30/19 1532 110 lb (49.9 kg)     Height 08/30/19 1532 5\' 10"  (1.778 m)     Head Circumference --      Peak Flow --      Pain Score 08/30/19 1532 10     Pain Loc --      Pain Edu? --      Excl. in GC? --    Constitutional: Alert and oriented. Well appearing and in no distress. Eyes: Normal exam ENT      Head: Normocephalic and atraumatic.      Mouth/Throat: Mucous membranes are moist. Cardiovascular: Normal rate, regular rhythm. Respiratory: Normal respiratory effort without tachypnea nor retractions. Breath sounds are clear Gastrointestinal: Soft, mild diffuse tenderness with moderate right lower quadrant tenderness.  No rebound guarding or distention Musculoskeletal: Nontender with normal range of motion in all extremities.  Neurologic:  Normal speech and language. No gross focal neurologic deficits Skin:  Skin is warm, dry and intact.  Psychiatric: Mood and affect are normal.       RADIOLOGY  CT shows no acute abnormality.  ____________________________________________   INITIAL IMPRESSION / ASSESSMENT AND PLAN / ED COURSE  Pertinent labs & imaging results that were available during my care of the patient were reviewed by me and considered in my medical decision making (see chart for details).   Patient presents to the emergency department  for abdominal discomfort worse in the right lower quadrant along with nausea vomiting.  Differential would include appendicitis, colitis, diverticulitis, UTI or pyelonephritis, biliary pathology.  We will check labs, CT scan abdomen/pelvis and continue to closely monitor.  Patient agreeable to plan of care.  CT scan shows no acute abnormality.  Lab work largely nonrevealing.  We will discharge patient home with PCP follow-up.  10/30/19 was evaluated in Emergency Department on 08/30/2019 for the symptoms described in the history of present illness. He was evaluated in the context of the global COVID-19 pandemic, which necessitated consideration that the patient might be at risk for infection with the SARS-CoV-2 virus that causes COVID-19. Institutional protocols and algorithms that pertain to the evaluation of patients at risk for COVID-19 are in a state of rapid change based on information released by regulatory bodies including the CDC and federal and  state organizations. These policies and algorithms were followed during the patient's care in the ED.  ____________________________________________   FINAL CLINICAL IMPRESSION(S) / ED DIAGNOSES  Abdominal pain   Harvest Dark, MD 08/30/19 1942

## 2019-08-30 NOTE — ED Notes (Signed)
First Nurse Note: Pt to ED via POV from home for N/V/D x 3 days.  VSS per EMS

## 2019-08-30 NOTE — ED Notes (Signed)
Pt to CT

## 2019-08-30 NOTE — ED Triage Notes (Signed)
Pt here with c/o lower abdominal pain that began a few days ago with vomiting, pt stating "I need to lay down, let me lay down." Vss, no cp or shob noted.

## 2019-09-08 ENCOUNTER — Ambulatory Visit: Payer: Medicare Other | Admitting: Internal Medicine

## 2019-09-22 ENCOUNTER — Ambulatory Visit: Payer: Medicare Other | Admitting: Internal Medicine

## 2019-10-27 ENCOUNTER — Observation Stay
Admission: EM | Admit: 2019-10-27 | Discharge: 2019-10-28 | Disposition: A | Discharge status: Home / Self Care | Payer: Medicare Other | Attending: Internal Medicine | Admitting: Internal Medicine

## 2019-10-27 ENCOUNTER — Other Ambulatory Visit: Payer: Self-pay

## 2019-10-27 ENCOUNTER — Emergency Department: Payer: Medicare Other

## 2019-10-27 DIAGNOSIS — R52 Pain, unspecified: Secondary | ICD-10-CM

## 2019-10-27 DIAGNOSIS — Z89421 Acquired absence of other right toe(s): Secondary | ICD-10-CM | POA: Diagnosis not present

## 2019-10-27 DIAGNOSIS — R600 Localized edema: Secondary | ICD-10-CM | POA: Insufficient documentation

## 2019-10-27 DIAGNOSIS — Z791 Long term (current) use of non-steroidal anti-inflammatories (NSAID): Secondary | ICD-10-CM | POA: Insufficient documentation

## 2019-10-27 DIAGNOSIS — L89154 Pressure ulcer of sacral region, stage 4: Secondary | ICD-10-CM | POA: Diagnosis not present

## 2019-10-27 DIAGNOSIS — L89319 Pressure ulcer of right buttock, unspecified stage: Secondary | ICD-10-CM | POA: Diagnosis not present

## 2019-10-27 DIAGNOSIS — Z9359 Other cystostomy status: Secondary | ICD-10-CM

## 2019-10-27 DIAGNOSIS — G8220 Paraplegia, unspecified: Secondary | ICD-10-CM | POA: Insufficient documentation

## 2019-10-27 DIAGNOSIS — L8994 Pressure ulcer of unspecified site, stage 4: Secondary | ICD-10-CM

## 2019-10-27 DIAGNOSIS — L89329 Pressure ulcer of left buttock, unspecified stage: Secondary | ICD-10-CM | POA: Diagnosis not present

## 2019-10-27 DIAGNOSIS — N319 Neuromuscular dysfunction of bladder, unspecified: Secondary | ICD-10-CM | POA: Diagnosis not present

## 2019-10-27 DIAGNOSIS — D509 Iron deficiency anemia, unspecified: Secondary | ICD-10-CM | POA: Diagnosis not present

## 2019-10-27 DIAGNOSIS — Z87891 Personal history of nicotine dependence: Secondary | ICD-10-CM | POA: Diagnosis not present

## 2019-10-27 DIAGNOSIS — E440 Moderate protein-calorie malnutrition: Secondary | ICD-10-CM | POA: Insufficient documentation

## 2019-10-27 DIAGNOSIS — Z20822 Contact with and (suspected) exposure to covid-19: Secondary | ICD-10-CM | POA: Insufficient documentation

## 2019-10-27 DIAGNOSIS — M7989 Other specified soft tissue disorders: Secondary | ICD-10-CM | POA: Insufficient documentation

## 2019-10-27 DIAGNOSIS — N390 Urinary tract infection, site not specified: Secondary | ICD-10-CM | POA: Insufficient documentation

## 2019-10-27 DIAGNOSIS — M8660 Other chronic osteomyelitis, unspecified site: Secondary | ICD-10-CM | POA: Diagnosis not present

## 2019-10-27 DIAGNOSIS — K5900 Constipation, unspecified: Secondary | ICD-10-CM | POA: Diagnosis not present

## 2019-10-27 DIAGNOSIS — Z978 Presence of other specified devices: Secondary | ICD-10-CM

## 2019-10-27 DIAGNOSIS — M8668 Other chronic osteomyelitis, other site: Secondary | ICD-10-CM | POA: Insufficient documentation

## 2019-10-27 DIAGNOSIS — L8990 Pressure ulcer of unspecified site, unspecified stage: Secondary | ICD-10-CM | POA: Insufficient documentation

## 2019-10-27 DIAGNOSIS — L089 Local infection of the skin and subcutaneous tissue, unspecified: Secondary | ICD-10-CM | POA: Insufficient documentation

## 2019-10-27 LAB — CBC WITH DIFFERENTIAL/PLATELET
Abs Immature Granulocytes: 0.06 10*3/uL (ref 0.00–0.07)
Basophils Absolute: 0.1 10*3/uL (ref 0.0–0.1)
Basophils Relative: 0 %
Eosinophils Absolute: 0.1 10*3/uL (ref 0.0–0.5)
Eosinophils Relative: 1 %
HCT: 31.8 % — ABNORMAL LOW (ref 39.0–52.0)
Hemoglobin: 9.9 g/dL — ABNORMAL LOW (ref 13.0–17.0)
Immature Granulocytes: 1 %
Lymphocytes Relative: 18 %
Lymphs Abs: 2.1 10*3/uL (ref 0.7–4.0)
MCH: 20.8 pg — ABNORMAL LOW (ref 26.0–34.0)
MCHC: 31.1 g/dL (ref 30.0–36.0)
MCV: 66.9 fL — ABNORMAL LOW (ref 80.0–100.0)
Monocytes Absolute: 0.9 10*3/uL (ref 0.1–1.0)
Monocytes Relative: 7 %
Neutro Abs: 8.9 10*3/uL — ABNORMAL HIGH (ref 1.7–7.7)
Neutrophils Relative %: 73 %
Platelets: 429 10*3/uL — ABNORMAL HIGH (ref 150–400)
RBC: 4.75 MIL/uL (ref 4.22–5.81)
RDW: 21.1 % — ABNORMAL HIGH (ref 11.5–15.5)
Smear Review: NORMAL
WBC: 12.2 10*3/uL — ABNORMAL HIGH (ref 4.0–10.5)
nRBC: 0 % (ref 0.0–0.2)

## 2019-10-27 LAB — PROTIME-INR
INR: 1.1 (ref 0.8–1.2)
Prothrombin Time: 13.8 seconds (ref 11.4–15.2)

## 2019-10-27 LAB — COMPREHENSIVE METABOLIC PANEL
ALT: 19 U/L (ref 0–44)
AST: 19 U/L (ref 15–41)
Albumin: 3.1 g/dL — ABNORMAL LOW (ref 3.5–5.0)
Alkaline Phosphatase: 63 U/L (ref 38–126)
Anion gap: 10 (ref 5–15)
BUN: 10 mg/dL (ref 6–20)
CO2: 26 mmol/L (ref 22–32)
Calcium: 8.6 mg/dL — ABNORMAL LOW (ref 8.9–10.3)
Chloride: 103 mmol/L (ref 98–111)
Creatinine, Ser: 0.57 mg/dL — ABNORMAL LOW (ref 0.61–1.24)
GFR calc Af Amer: >60 mL/min (ref >60)
GFR calc non Af Amer: >60 mL/min (ref >60)
Glucose, Bld: 97 mg/dL (ref 70–99)
Potassium: 3.5 mmol/L (ref 3.5–5.1)
Sodium: 139 mmol/L (ref 135–145)
Total Bilirubin: 0.3 mg/dL (ref 0.3–1.2)
Total Protein: 7.2 g/dL (ref 6.5–8.1)

## 2019-10-27 LAB — URINALYSIS, COMPLETE (UACMP) WITH MICROSCOPIC
Bacteria, UA: NONE SEEN
Bilirubin Urine: NEGATIVE
Glucose, UA: NEGATIVE mg/dL
Ketones, ur: NEGATIVE mg/dL
Nitrite: NEGATIVE
Protein, ur: NEGATIVE mg/dL
Specific Gravity, Urine: 1.01 (ref 1.005–1.030)
Squamous Epithelial / HPF: NONE SEEN (ref 0–5)
WBC, UA: >50 WBC/hpf — ABNORMAL HIGH (ref 0–5)
pH: 9 — ABNORMAL HIGH (ref 5.0–8.0)

## 2019-10-27 LAB — LACTIC ACID, PLASMA
Lactic Acid, Venous: 1.9 mmol/L (ref 0.5–1.9)
Lactic Acid, Venous: 1.4 mmol/L (ref 0.5–1.9)

## 2019-10-27 LAB — SARS CORONAVIRUS 2 BY RT PCR (HOSPITAL ORDER, PERFORMED IN ~~LOC~~ HOSPITAL LAB): SARS Coronavirus 2: NEGATIVE

## 2019-10-27 LAB — C-REACTIVE PROTEIN: CRP: 9.3 mg/dL — ABNORMAL HIGH (ref <1.0)

## 2019-10-27 LAB — BRAIN NATRIURETIC PEPTIDE: B Natriuretic Peptide: 41.1 pg/mL (ref 0.0–100.0)

## 2019-10-27 LAB — SEDIMENTATION RATE: Sed Rate: 73 mm/hr — ABNORMAL HIGH (ref 0–15)

## 2019-10-27 MED ORDER — SENNOSIDES-DOCUSATE SODIUM 8.6-50 MG PO TABS: 1.0000 | ORAL_TABLET | Freq: Every evening | ORAL | Status: DC | PRN

## 2019-10-27 MED ORDER — VANCOMYCIN HCL IN DEXTROSE 1-5 GM/200ML-% IV SOLN
1000.0000 mg | Freq: Once | INTRAVENOUS | Status: AC
  Administered 2019-10-27: 1000 mg via INTRAVENOUS
  Filled 2019-10-27: qty 200

## 2019-10-27 MED ORDER — SODIUM CHLORIDE 0.9 % IV SOLN
1.0000 g | INTRAVENOUS | Status: DC
  Administered 2019-10-28: 1 g via INTRAVENOUS
  Filled 2019-10-27: qty 1
  Filled 2019-10-27: qty 10

## 2019-10-27 MED ORDER — IOHEXOL 300 MG/ML  SOLN
75.0000 mL | Freq: Once | INTRAMUSCULAR | Status: AC | PRN
  Administered 2019-10-27: 75 mL via INTRAVENOUS

## 2019-10-27 MED ORDER — MORPHINE SULFATE (PF) 4 MG/ML IV SOLN
4.0000 mg | Freq: Once | INTRAVENOUS | Status: AC
  Administered 2019-10-27: 4 mg via INTRAVENOUS
  Filled 2019-10-27: qty 1

## 2019-10-27 MED ORDER — ONDANSETRON HCL 4 MG/2ML IJ SOLN: 4.0000 mg | Freq: Three times a day (TID) | INTRAMUSCULAR | Status: DC | PRN

## 2019-10-27 MED ORDER — MELATONIN 5 MG PO TABS
5.0000 mg | ORAL_TABLET | Freq: Every day | ORAL | Status: DC
  Administered 2019-10-27: 5 mg via ORAL
  Filled 2019-10-27: qty 1

## 2019-10-27 MED ORDER — VANCOMYCIN HCL 750 MG/150ML IV SOLN
750.0000 mg | Freq: Two times a day (BID) | INTRAVENOUS | Status: DC
  Administered 2019-10-27: 750 mg via INTRAVENOUS
  Filled 2019-10-27 (×3): qty 150

## 2019-10-27 MED ORDER — ACETAMINOPHEN 325 MG PO TABS: 650.0000 mg | ORAL_TABLET | Freq: Four times a day (QID) | ORAL | Status: DC | PRN

## 2019-10-27 MED ORDER — FERROUS SULFATE 325 (65 FE) MG PO TABS
325.0000 mg | ORAL_TABLET | Freq: Every day | ORAL | Status: DC
  Administered 2019-10-28: 325 mg via ORAL
  Filled 2019-10-27: qty 1

## 2019-10-27 MED ORDER — SODIUM CHLORIDE 0.9 % IV SOLN
1.0000 g | Freq: Once | INTRAVENOUS | Status: AC
  Administered 2019-10-27: 1 g via INTRAVENOUS
  Filled 2019-10-27: qty 10

## 2019-10-27 MED ORDER — SODIUM CHLORIDE 0.9% FLUSH: 3.0000 mL | Freq: Once | INTRAVENOUS | Status: DC

## 2019-10-27 MED ORDER — DAKINS (1/4 STRENGTH) 0.125 % EX SOLN
1.0000 "application " | Freq: Every morning | CUTANEOUS | Status: DC
  Administered 2019-10-28: 1
  Filled 2019-10-27: qty 473

## 2019-10-27 MED ORDER — OXYCODONE-ACETAMINOPHEN 5-325 MG PO TABS
1.0000 | ORAL_TABLET | Freq: Four times a day (QID) | ORAL | Status: DC | PRN
  Administered 2019-10-27 – 2019-10-28 (×3): 1 via ORAL
  Filled 2019-10-27 (×3): qty 1

## 2019-10-27 MED ORDER — ENOXAPARIN SODIUM 40 MG/0.4ML ~~LOC~~ SOLN
40.0000 mg | SUBCUTANEOUS | Status: DC
  Administered 2019-10-27: 40 mg via SUBCUTANEOUS
  Filled 2019-10-27: qty 0.4

## 2019-10-27 MED ORDER — CHLORHEXIDINE GLUCONATE CLOTH 2 % EX PADS: 6.0000 | MEDICATED_PAD | Freq: Every day | CUTANEOUS | Status: DC

## 2019-10-27 MED ORDER — ONDANSETRON HCL 4 MG/2ML IJ SOLN
4.0000 mg | Freq: Once | INTRAMUSCULAR | Status: AC
  Administered 2019-10-27: 4 mg via INTRAVENOUS
  Filled 2019-10-27: qty 2

## 2019-10-27 NOTE — Consult Note (Addendum)
[facility] SURGICAL CONSULTATION NOTE (initial) - cpt: 62130   HISTORY OF PRESENT ILLNESS (HPI):  44 y.o. male presented to [facility] ED today for evaluation of leg swelling. Patient reports that over the last few days had increased swelling to his bilateral lower extremities. In the past, he reports that this leg swelling has often been the first indicator of an infection for him. He denied any associated fever, chills, nausea, emesis, abdominal pain. He does have a history of chronic sacral and ischial wounds for which he has been managing at home with diluted Dakin's solution. He has a history of chronic osteomyelitis as well. There have been issues in the past with saturation in urine secondary to these wound tracking towards his urethra. Our last service in March of this year and his wounds did not require any debridement at that time. He was referred to [facility] urology for evaluation for possible urine management however his appointment is not until August. Work up in the ED was concerning for very mild leukocytosis to 12K but was otherwise reassuring and was admitted to medicine service for presumed sepsis from infected sacral ulcerations.  Surgery is consulted by hospitalist physician Dr. Lorretta Harp, MD in this context for evaluation and management of chronic sacral ulcerations.   PAST MEDICAL HISTORY (PMH):  Past Medical History:  Diagnosis Date  . Anxiety   . Decubitus ulcer   . Depression   . Neurogenic bladder   . Osteomyelitis (HCC)   . Paraparesis of both lower limbs (HCC) 02/21/00     PAST SURGICAL HISTORY (PSH):  Past Surgical History:  Procedure Laterality Date  . AMPUTATION Right 03/19/2018   Procedure: 5th Metatarsal Resection;  Surgeon: Recardo Evangelist, DPM;  Location: [facility] ORS;  Service: Podiatry;  Laterality: Right;  . AMPUTATION TOE Right 04/29/2015   Procedure: AMPUTATION TOE;  Surgeon: Linus Galas, MD;  Location: [facility] ORS;  Service: Podiatry;  Laterality:  Right;  . IRRIGATION AND DEBRIDEMENT BUTTOCKS    . TOE AMPUTATION Right      MEDICATIONS:  Prior to Admission medications   Medication Sig Start Date End Date Taking? Authorizing Provider  ibuprofen (ADVIL) 200 MG tablet Take by mouth.   Yes [provider]     ALLERGIES:  Allergies  Allergen Reactions  . Orange Fruit [Citrus] Other (See Comments)    Blisters only from ORANGES!!! Patient is not allergic from all citrus     SOCIAL HISTORY:  Social History   Socioeconomic History  . Marital status: Single    Spouse name: Not on file  . Number of children: Not on file  . Years of education: Not on file  . Highest education level: Not on file  Occupational History  . Not on file  Tobacco Use  . Smoking status: Former Smoker    Packs/day: 0.50    Types: Cigarettes  . Smokeless tobacco: Never Used  Substance and Sexual Activity  . Alcohol use: No  . Drug use: Yes    Types: Marijuana    Comment: 2 days ago  . Sexual activity: Never  Other Topics Concern  . Not on file  Social History Narrative  . Not on file   Social Determinants of Health   Financial Resource Strain:   . Difficulty of Paying Living Expenses:   Food Insecurity:   . Worried About Programme researcher, broadcasting/film/video in the Last Year:   . Barista in the Last Year:   Transportation Needs:   .  Lack of Transportation (Medical):   [name] Lack of Transportation (Non-Medical):   Physical Activity:   . Days of Exercise per Week:   . Minutes of Exercise per Session:   Stress:   . Feeling of Stress :   Social Connections:   . Frequency of Communication with Friends and Family:   . Frequency of Social Gatherings with Friends and Family:   . Attends Religious Services:   . Active Member of Clubs or Organizations:   . Attends Banker Meetings:   [name] Marital Status:   Intimate Partner Violence:   . Fear of Current or Ex-Partner:   . Emotionally Abused:   [name] Physically Abused:   . Sexually Abused:       FAMILY HISTORY:  Family History  Problem Relation Age of Onset  . Lymphoma Sister       REVIEW OF SYSTEMS:  Review of Systems  Constitutional: Negative for chills and fever.  HENT: Negative for congestion and sore throat.   Respiratory: Negative for cough and shortness of breath.   Cardiovascular: Positive for leg swelling. Negative for chest pain and palpitations.  Gastrointestinal: Negative for abdominal pain, nausea and vomiting.  Musculoskeletal:       + Peripheral edema  All other systems reviewed and are negative.   VITAL SIGNS:  Temp:  [99 F (37.2 C)] 99 F (37.2 C) (06/30 0107) Pulse Rate:  [74-108] 94 (06/30 1200) Resp:  [14-18] 16 (06/30 1200) BP: (94-122)/(61-77) 115/71 (06/30 1200) SpO2:  [97 %-100 %] 97 % (06/30 1200) Weight:  [49.9 kg] 49.9 kg (06/30 0101)     Height: 5\' 10"  (177.8 cm) Weight: 49.9 kg BMI (Calculated): 15.78   INTAKE/OUTPUT:  No intake/output data recorded.  PHYSICAL EXAM:  Physical Exam Vitals and nursing note reviewed. Exam conducted with a chaperone present.  Constitutional:      General: He is not in acute distress.    Appearance: Normal appearance. He is not ill-appearing.  HENT:     Head: Normocephalic and atraumatic.  Eyes:     Conjunctiva/sclera: Conjunctivae normal.     Pupils: Pupils are equal, round, and reactive to light.  Cardiovascular:     Rate and Rhythm: Normal rate and regular rhythm.     Pulses: Normal pulses.     Heart sounds: No murmur heard.   Pulmonary:     Effort: Pulmonary effort is normal. No respiratory distress.     Breath sounds: Normal breath sounds.  Musculoskeletal:     Right lower leg: Edema present.     Left lower leg: Edema present.     Comments: 2+ pitting edema to the feet and distal bilateral extremities, chronic contracture and atrophy secondary to paraplegia  Skin:    General: Skin is warm and dry.       Neurological:     General: No focal deficit present.     Mental Status: He  is alert and oriented to person, place, and time.  Psychiatric:        Mood and Affect: Mood normal.        Behavior: Behavior normal.    Sacral wound (10/27/2019):     Left Sacral/Ischial Wound (10/27/2019):     Right Ischial/Sacral Wound (10/27/2019):    --> There is tracking the above wound towards the urethra and likely source of urinary saturation however the track appears clean and without evidence of infection      Labs:  CBC Latest Ref Rng & Units 10/27/2019 08/30/2019  07/14/2019  WBC 4.0 - 10.5 K/uL 12.2(H) 10.2 8.7  Hemoglobin 13.0 - 17.0 g/dL 4.9(E) 11.6(L) 9.5(L)  Hematocrit 39 - 52 % 31.8(L) 38.4(L) 32.2(L)  Platelets 150 - 400 K/uL 429(H) 493(H) 365   CMP Latest Ref Rng & Units 10/27/2019 08/30/2019 07/16/2019  Glucose 70 - 99 mg/dL 97 010(O) -  BUN 6 - 20 mg/dL 10 14 -  Creatinine 7.12 - 1.24 mg/dL 1.97(J) 8.83(G) 5.49(I)  Sodium 135 - 145 mmol/L 139 139 -  Potassium 3.5 - 5.1 mmol/L 3.5 3.6 -  Chloride 98 - 111 mmol/L 103 105 -  CO2 22 - 32 mmol/L 26 21(L) -  Calcium 8.9 - 10.3 mg/dL 2.6(E) 8.9 -  Total Protein 6.5 - 8.1 g/dL 7.2 7.8 -  Total Bilirubin 0.3 - 1.2 mg/dL 0.3 0.8 -  Alkaline Phos 38 - 126 U/L 63 75 -  AST 15 - 41 U/L 19 16 -  ALT 0 - 44 U/L 19 12 -    Imaging studies:   CT Abdomen/Pelvis (10/27/2019) personally reviewed and agree with radiologist report reviewed below:  IMPRESSION: 1. Extensive sacral and ischial decubitus ulcers with progressive ulceration at the perineum which extends to the posterior urethra with gas encompassing the Foley catheter, correlating with history of urine leak. Chronic osteomyelitis and extensive pelvic erosion. No abscess. 2. No acute intra-abdominal finding   Assessment/Plan: (ICD-10's: L89.154) 44 y.o. male with stage IV chronic sacral/ischial ulceration which do not appear actively infected but may have underlying chronic osteomyelitis , complicated by pertinent comorbidities including history of  paraplegia.   - No emergent surgical intervention needed; continue home dressing changes  - Continue IV Abx (Vancomycin/Rocephin) for likely chronic osteomyelitis  - Frequent repositioning and weight offloading   - Pain control prn  - Further management per primary service; we will sign off please call with questions  All of the above findings and recommendations were discussed with the patient, and all of patient's questions were answered to his expressed satisfaction.  Thank you for the opportunity to participate in this patient's care.   -- Lynden Oxford, PA-C [facility] 10/27/2019, 12:57 PM [phone] M-F: 7am - 4pm

## 2019-10-27 NOTE — Consult Note (Addendum)
WOC Nurse Consult Note: Reason for Consult: Consult requested for sacrum and bilat ischium. Performed remotely after review of photos in the EMR and progress notes.  Pt is familiar to [facility] team from previous admissions.  He has chronic osteomyelitis but recently his wounds have declined with increased tan drainage and foul odor.  Wound type: Sacrum, left and right ischium with chronic stage 4 pressure injuries; red and moist Pressure Injury POA: Yes Dressing procedure/placement/frequency: Pt has been receiving Dakins solution for packing prior to admission.  Continue present plan of care for 5 days.  Air mattress to decrease pressure.  X-rays indicate: "Extensive sacral and ischial decubitus ulcers with progressive ulceration at the perineum which extends to the posterior urethra with gas encompassing the Foley catheter, correlating with history of urine leak. Chronic osteomyelitis and extensive pelvic erosion."  This complex medical condition is beyond the scope of practice for WOC nurses.  Please refer to the surgical team for further plan of care.  Topical treatment orders provided for bedside nurses to perform as follows: Pack left and right ischium and sacrum wounds Q day with Dakins moistened gauze, then cover with ABD pads and tape. Please re-consult if further assistance is needed.  Thank-you,  Cammie Mcgee MSN, RN, CWOCN, [place], CNS [phone]

## 2019-10-27 NOTE — ED Provider Notes (Signed)
[facility] Emergency Department Provider Note  ____________________________________________   First MD Initiated Contact with Patient 10/27/19 0327     (approximate)  I have reviewed the triage vital signs and the nursing notes.  History  Chief Complaint Leg Swelling    HPI Jonathon York is a 44 y.o. male past medical history as below, including paraplegia, chronic indwelling Foley catheter, sacral decubitus ulcer, chronic osteomyelitis, who presents to the emergency department with concern for recurrent infection.  Patient states over the last week or so he has had increased swelling in his lower extremities, bilateral and symmetric.  States usually this is an indication of some kind of infection.  He is concerned that this may either be located in his right foot, where he has had osteo before (along w/ the associated swelling), or infection related to his decubitus ulcers.  He is particularly concerned about his sacral decubitus ulcers as at the onset of his symptoms they had significant malodorous drainage.  He has been treating them at home with wet-to-dry Dakin's with improvement in the drainage and the smell.  Reports low-grade fever at home yesterday.  States his urine looks cloudy.  Reports pain at the location of his decubitus ulcers, aching, moderate in severity, no radiation.  Of note, he states he is due for an upcoming procedure to fix an anatomical issue with his urethra which is causing leaking from his urinary system into his wounds.   Past Medical Hx Past Medical History:  Diagnosis Date  . Anxiety   . Decubitus ulcer   . Depression   . Neurogenic bladder   . Osteomyelitis (HCC)   . Paraparesis of both lower limbs (HCC) 02/21/00    Problem List Patient Active Problem List   Diagnosis Date Noted  . Chronic osteomyelitis (HCC)   . Constipation   . Iron deficiency anemia   . Sacral decubitus ulcer, stage IV (HCC) 07/13/2019  .  Intractable pain 11/26/2018  . Anxiety 04/22/2018  . Acute osteomyelitis of right foot (HCC) 03/18/2018  . Pressure injury of skin 03/18/2018  . 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 Protein-calorie malnutrition, severe 11/20/2016  . Herpes simplex infection of penis   . Decubitus ulcer 11/19/2016  . 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 Decubitus ulcer of sacral region   . Sepsis (HCC) 04/15/2016  . 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 Pressure ulcer stage III 04/30/2015  . Sterile pyuria 04/30/2015  . Paraplegia (HCC) 04/30/2015  . Neurogenic bladder 04/30/2015  . 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 Moderate malnutrition (HCC) 04/28/2015    Past Surgical Hx Past Surgical History:  Procedure Laterality Date  . AMPUTATION Right 03/19/2018   Procedure: 5th Metatarsal Resection;  Surgeon: Recardo Evangelist, DPM;  Location: [facility] ORS;  Service: Podiatry;  Laterality: Right;  . 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 IRRIGATION AND DEBRIDEMENT BUTTOCKS    . TOE AMPUTATION Right     Medications Prior to Admission medications   Medication Sig Start Date End Date Taking? Authorizing Provider  ibuprofen (ADVIL) 200 MG tablet Take by mouth.   Yes [provider]  Vitamins/Minerals TABS Take 1 tablet by mouth daily. 11/22/16  Yes [provider]  ascorbic acid (VITAMIN C) 500 MG tablet Take 1 tablet (500 mg total) by  mouth 2 (two) times daily. 07/16/19   Alford Highland, MD  docusate sodium (COLACE) 100 MG capsule Take 1 capsule (100 mg total) by mouth 2 (two) times daily as needed for mild constipation. 07/16/19   Alford Highland, MD  feeding supplement, ENSURE ENLIVE, (ENSURE ENLIVE) LIQD Take 237 mLs by mouth 3 (three) times daily between meals. 07/16/19   Alford Highland,  MD    Allergies Orange fruit [citrus]  Family Hx Family History  Problem Relation Age of Onset  . Lymphoma Sister     Social Hx Social History   Tobacco Use  . Smoking status: Former Smoker    Packs/day: 0.50    Types: Cigarettes  . Smokeless tobacco: Never Used  Substance Use Topics  . Alcohol use: No  . Drug use: Yes    Types: Marijuana    Comment: 2 days ago     Review of Systems  Constitutional: + for fever. Negative for chills. Eyes: Negative for visual changes. ENT: Negative for sore throat. Cardiovascular: Negative for chest pain. Respiratory: Negative for shortness of breath. Gastrointestinal: Negative for nausea. Negative for vomiting.  Genitourinary: Negative for dysuria. Musculoskeletal: + for leg swelling. Skin: + decubitus ulcers Neurological: Negative for headaches.   Physical Exam  Vital Signs: ED Triage Vitals  Enc Vitals Group     BP 10/27/19 0107 122/70     Pulse Rate 10/27/19 0107 (!) 107     Resp 10/27/19 0107 18     Temp 10/27/19 0107 99 F (37.2 C)     Temp src --      SpO2 10/27/19 0107 99 %     Weight 10/27/19 0101 110 lb (49.9 kg)     Height 10/27/19 0101 5\' 10"  (1.778 m)     Head Circumference --      Peak Flow --      Pain Score 10/27/19 0101 8     Pain Loc --      Pain Edu? --      Excl. in GC? --     Constitutional: Alert and oriented. Thin. Appears older than stated age.  Head: Normocephalic. Atraumatic. Eyes: Conjunctivae clear. Sclera anicteric. Pupils equal and symmetric. Nose: No masses or lesions. No congestion or rhinorrhea. Mouth/Throat: Wearing mask.  Neck: No stridor. Trachea midline.  Cardiovascular: Tachycardia, regular rhythm. Extremities well perfused. Respiratory: Normal respiratory effort.  Lungs CTAB. Gastrointestinal: Soft. Non-distended. Non-tender.  Genitourinary: Deferred. Musculoskeletal: Mild 1-2+ edema to the BLE, symmetric.  No erythema or warmth to the LE.  Status post several toe  amputations on the right.  No obvious skin breakdown or ulcers to the feet. Neurologic:  Normal speech and language. C/w history of paraplegia. Skin: Extensive stage IV decubitus ulcers to the sacrum and ischial tuberosities.  No significant odor and the surrounding skin is otherwise well-appearing. Psychiatric: Mood and affect are appropriate for situation.    Radiology  Personally reviewed available imaging myself.   CT A/P -  IMPRESSION: 1. Extensive sacral and ischial decubitus ulcers with progressive ulceration at the perineum which extends to the posterior urethra with gas encompassing the Foley catheter, correlating with history of urine leak. Chronic osteomyelitis and extensive pelvic erosion. No abscess. 2. No acute intra-abdominal finding  XR foot R - IMPRESSION:  1. Postsurgical changes as above.  2. Severe osteopenia. No evidence of osteomyelitis.    Procedures  Procedure(s) performed (including critical care):  Procedures   Initial Impression / Assessment and Plan / MDM / ED Course  44 y.o. male past medical history as below, including paraplegia, chronic indwelling Foley catheter, sacral decubitus ulcer, chronic osteomyelitis, who presents to the emergency department with concern for recurrent infection.  Differential includes worsening sacral decubitus ulcers, acute on chronic osteomyelitis of the sacrum and/or foot, UTI, amongst others.  Vitals on arrival revealed temp 99, mild tachycardia.  Exam as above.  Will obtain broad blood work, including cultures, UA, and obtain imaging.  Labs reveal leukocytosis to 12.2.  UA concerning for infection with LE, WBCs, WBC clumps.  Lactic reassuringly normal.  XR of foot negative for osteomyelitis.  However, CT imaging reveals worsening of his sacral and ischial decubitus ulcers, with chronic osteomyelitis and extensive pelvic erosion.  With his leukocytosis, tachycardia on arrival, low-grade temperature, as well as  concomitant possible UTI, will plan to treat with antibiotics and admit.  _______________________________   As part of my medical decision making I have reviewed available labs, radiology tests, reviewed old records/performed chart review.    Final Clinical Impression(s) / ED Diagnosis  Final diagnoses:  Urinary tract infection in male  Pressure injury of sacral region, stage 4 [facility])       Note:  This document was prepared using Dragon voice recognition software and may include unintentional dictation errors.   Miguel Aschoff., MD 10/27/19 [phone]

## 2019-10-27 NOTE — ED Notes (Signed)
ED RN has not seen Wound Care RN assess or change dressings. RN has informed receiving RN [place] that Wound Care RN still needs to come and dress wounds.

## 2019-10-27 NOTE — H&P (Addendum)
History and Physical    Jonathon York KGS:[id number] DOB: Aug 07, 1975 DOA: 10/27/2019  Referring MD/NP/PA:   PCP: Leonel Ramsay, MD   Patient coming from:  The patient is coming from home.  At baseline, pt is independent for most of ADL.        Chief Complaint: pain due to sacral ulcer  HPI: Jonathon York is a 44 y.o. male with medical history significant of paraplegia, chronic indwelling Foley catheter, sacral decubitus ulcer, chronic osteomyelitis, iron deficiency anemia, depression with anxiety, s/p of amputation ofright fourth and fifth toe, who presents with pain due to sacral ulcer.   Pt has chronic sacral decubitus ulcer. He has been treating them at home with wet-to-dry Dakin's. He states that his pain has been worsening in the past several days, which is constant, sharp, 8 out of 10 severity, nonradiating.  Patient has subjective low-grade fever at home, no chills.  No chest pain, shortness breath, cough.  Denies nausea, vomiting, diarrhea, abdominal pain.  He states that his urine looks cloudy and he is concerned that he may have UTI. Pt states that he has bilateral leg edema.  Of note, he states he states that he has anatomical issue with his urethra which is causing leaking from his urinary system into his wounds. He is supposed to follow up in [facility] for possible procedure for fixing this issue. He states that he has to have infection cleared before they can do anything. He does not want to go to [facility] now.  ED Course: pt was found to have WBC 12.5, lactic acid 1.9, 1.4, urinalysis (cloudy appearance, large amount of leukocyte, none bacteria, WBC >50), electrolytes renal function okay, temperature 99, blood pressure 114/72, heart rate 87, RR 18, oxygen saturation 99%.  Patient is admitted to MedSurg bed as inpatient  CT-abd/pelvis: 1. Extensive sacral and ischial decubitus ulcers with progressive ulceration at the perineum which extends to the posterior urethra with gas  encompassing the Foley catheter, correlating with history of urine leak. Chronic osteomyelitis and extensive pelvic erosion. No abscess. 2. No acute intra-abdominal finding  Review of Systems:   General: has subjective fevers, no chills, no body weight gain, has fatigue HEENT: no blurry vision, hearing changes or sore throat Respiratory: no dyspnea, coughing, wheezing CV: no chest pain, no palpitations GI: no nausea, vomiting, abdominal pain, diarrhea, constipation GU: no dysuria, burning on urination, increased urinary frequency, hematuria. Has cloudy urine. Has indwelling foley cath. Ext: has leg edema Neuro: No vision change or hearing loss. Has paraplegia Skin: has stage IV sacral ulcer MSK: No muscle spasm, no deformity, no limitation of range of movement in spin Heme: No easy bruising.  Travel history: No recent long distant travel.  Allergy:  Allergies  Allergen Reactions  . Orange Fruit [Citrus] Other (See Comments)    Blisters only from [place]!!! Patient is not allergic from all citrus    Past Medical History:  Diagnosis Date  . Anxiety   . Decubitus ulcer   . Depression   . Neurogenic bladder   . Osteomyelitis ([place])   . Paraparesis of both lower limbs ([place]) 02/21/00    Past Surgical History:  Procedure Laterality Date  . AMPUTATION Right 03/19/2018   Procedure: 5th Metatarsal Resection;  Surgeon: Albertine Patricia, DPM;  Location: [facility] ORS;  Service: Podiatry;  Laterality: Right;  . AMPUTATION TOE Right 04/29/2015   Procedure: AMPUTATION TOE;  Surgeon: Sharlotte Alamo, MD;  Location: [facility] ORS;  Service: Podiatry;  Laterality: Right;  .  IRRIGATION AND DEBRIDEMENT BUTTOCKS    . TOE AMPUTATION Right     Social History:  reports that he has quit smoking. His smoking use included cigarettes. He smoked 0.50 packs per day. He has never used smokeless tobacco. He reports current drug use. Drug: Marijuana. He reports that he does not drink alcohol.  Family History:  Family  History  Problem Relation Age of Onset  . Lymphoma Sister      Prior to Admission medications   Medication Sig Start Date End Date Taking? Authorizing Provider  ibuprofen (ADVIL) 200 MG tablet Take by mouth.   Yes [provider]    Physical Exam: Vitals:   10/27/19 0441 10/27/19 0717 10/27/19 0800 10/27/19 0820  BP: 114/72 116/72 117/77   Pulse: 87 98 (!) 108 89  Resp: 16 15 16    Temp:      SpO2: 99% 100% 97% 97%  Weight:      Height:       General: Not in acute distress HEENT:       Eyes: PERRL, EOMI, no scleral icterus.       ENT: No discharge from the ears and nose, no pharynx injection, no tonsillar enlargement.        Neck: No JVD, no bruit, no mass felt. Heme: No neck lymph node enlargement. Cardiac: S1/S2, RRR, No murmurs, No gallops or rubs. Respiratory: No rales, wheezing, rhonchi or rubs. GI: Soft, nondistended, nontender, no rebound pain, no organomegaly, BS present. GU: No hematuria Ext: has 1+ pitting leg edema bilaterally. 1+DP/PT pulse bilaterally. S/p of amputation of right fourth and fifth toe, Musculoskeletal: No joint deformities, No joint redness or warmth, no limitation of ROM in spin. Skin: has extensive, stage IV sacral ulcers, no purulence drainage.      Neuro: Alert, oriented X3, cranial nerves II-XII grossly intact. Has paraplegia Psych: Patient is not psychotic, no suicidal or hemocidal ideation.  Labs on Admission: I have personally reviewed following labs and imaging studies  CBC: Recent Labs  Lab 10/27/19 0115  WBC 12.2*  NEUTROABS 8.9*  HGB 9.9*  HCT 31.8*  MCV 66.9*  PLT 396*   Basic Metabolic Panel: Recent Labs  Lab 10/27/19 0115  NA 139  K 3.5  CL 103  CO2 26  GLUCOSE 97  BUN 10  CREATININE 0.57*  CALCIUM 8.6*   GFR: Estimated Creatinine Clearance: 83.2 mL/min (A) (by C-G formula based on SCr of 0.57 mg/dL (L)). Liver Function Tests: Recent Labs  Lab 10/27/19 0115  AST 19  ALT 19  ALKPHOS 63    BILITOT 0.3  PROT 7.2  ALBUMIN 3.1*   No results for input(s): LIPASE, AMYLASE in the last 168 hours. No results for input(s): AMMONIA in the last 168 hours. Coagulation Profile: No results for input(s): INR, PROTIME in the last 168 hours. Cardiac Enzymes: No results for input(s): CKTOTAL, CKMB, CKMBINDEX, TROPONINI in the last 168 hours. BNP (last 3 results) No results for input(s): PROBNP in the last 8760 hours. HbA1C: No results for input(s): HGBA1C in the last 72 hours. CBG: No results for input(s): GLUCAP in the last 168 hours. Lipid Profile: No results for input(s): CHOL, HDL, LDLCALC, TRIG, CHOLHDL, LDLDIRECT in the last 72 hours. Thyroid Function Tests: No results for input(s): TSH, T4TOTAL, FREET4, T3FREE, THYROIDAB in the last 72 hours. Anemia Panel: No results for input(s): VITAMINB12, FOLATE, FERRITIN, TIBC, IRON, RETICCTPCT in the last 72 hours. Urine analysis:    Component Value Date/Time   COLORURINE YELLOW (  A) 10/27/2019 0115   APPEARANCEUR CLOUDY (A) 10/27/2019 0115   APPEARANCEUR Turbid 12/24/2013 0313   LABSPEC 1.010 10/27/2019 0115   LABSPEC 1.021 12/24/2013 0313   PHURINE 9.0 (H) 10/27/2019 0115   GLUCOSEU NEGATIVE 10/27/2019 0115   GLUCOSEU Negative 12/24/2013 0313   HGBUR SMALL (A) 10/27/2019 0115   BILIRUBINUR NEGATIVE 10/27/2019 0115   BILIRUBINUR Negative 12/24/2013 0313   KETONESUR NEGATIVE 10/27/2019 0115   PROTEINUR NEGATIVE 10/27/2019 0115   NITRITE NEGATIVE 10/27/2019 0115   LEUKOCYTESUR LARGE (A) 10/27/2019 0115   LEUKOCYTESUR 1+ 12/24/2013 0313   Sepsis Labs: @LABRCNTIP (procalcitonin:4,lacticidven:4) ) Recent Results (from the past 240 hour(s))  Culture, blood (routine x 2)     Status: None (Preliminary result)   Collection Time: 10/27/19  4:38 AM   Specimen: Left Antecubital; Blood  Result Value Ref Range Status   Specimen Description LEFT ANTECUBITAL  Final   Special Requests   Final    BOTTLES DRAWN AEROBIC AND ANAEROBIC Blood  Culture results may not be optimal due to an excessive volume of blood received in culture bottles   Culture   Final    NO GROWTH < 12 HOURS Performed at [facility], [address] [place], [place] [address]    Report Status PENDING  Incomplete  Culture, blood (routine x 2)     Status: None (Preliminary result)   Collection Time: 10/27/19  4:38 AM   Specimen: BLOOD RIGHT HAND  Result Value Ref Range Status   Specimen Description BLOOD RIGHT HAND  Final   Special Requests   Final    BOTTLES DRAWN AEROBIC AND ANAEROBIC Blood Culture results may not be optimal due to an excessive volume of blood received in culture bottles   Culture   Final    NO GROWTH < 12 HOURS Performed at [facility], [address] [place], [place] [address]    Report Status PENDING  Incomplete     Radiological Exams on Admission: CT Abdomen Pelvis W Contrast  Result Date: 10/27/2019 CLINICAL DATA:  Sacral decubitus ulcer with urine drainage EXAM: CT ABDOMEN AND PELVIS WITH CONTRAST TECHNIQUE: Multidetector CT imaging of the abdomen and pelvis was performed using the standard protocol following bolus administration of intravenous contrast. CONTRAST:  32m OMNIPAQUE IOHEXOL 300 MG/ML  SOLN COMPARISON:  08/30/2019 FINDINGS: Lower chest:  No acute finding Hepatobiliary: Tiny right hepatic cystic density.No evidence of biliary obstruction or stone. Pancreas: Unremarkable. Spleen: Unremarkable. Adrenals/Urinary Tract: Negative adrenals. No hydronephrosis or stone. Small left renal cystic densities. Foley catheter in place and there is complete collapse of the bladder. The patient's decubitus ulcer tunnels from right para median to central at the level of the posterior urethra with gas encompassing the urinary catheter. Stomach/Bowel:  No obstruction. No visible bowel inflammation Vascular/Lymphatic: Atherosclerotic calcification of the aorta and iliacs. Chronic inguinal and pelvic adenopathy that is  considered reactive. Reproductive:Perineal ulceration. Other: No ascites or pneumoperitoneum. Musculoskeletal: Thoracic spinal fusion with subjacent severe muscular atrophy. Extensive bilateral sacral and ischial decubitus ulcer with extensive lower sacral and bilateral ischial/pubic body erosions and sclerosis. No collection. No acute fracture. IMPRESSION: 1. Extensive sacral and ischial decubitus ulcers with progressive ulceration at the perineum which extends to the posterior urethra with gas encompassing the Foley catheter, correlating with history of urine leak. Chronic osteomyelitis and extensive pelvic erosion. No abscess. 2. No acute intra-abdominal finding Electronically Signed   By: JMonte FantasiaM.D.   On: 10/27/2019 05:44   DG Foot Complete Right  Result  Date: 10/27/2019 CLINICAL DATA:  First and second digit bruising, history of osteomyelitis and amputation, edema EXAM: RIGHT FOOT COMPLETE - 3+ VIEW COMPARISON:  04/22/2018 FINDINGS: Frontal, oblique, and lateral views of the right foot are obtained. Previous amputation at the first interphalangeal joint, fourth metacarpal phalangeal joint, and proximal fifth metacarpal are seen. The bones are diffusely osteopenic. There are no acute or destructive bony lesions. Diffuse soft tissue swelling is noted. IMPRESSION: 1. Postsurgical changes as above. 2. Severe osteopenia.  No evidence of osteomyelitis. Electronically Signed   By: Randa Ngo M.D.   On: 10/27/2019 01:59     EKG: Not done in ED, will get one.   Assessment/Plan Principal Problem:   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 Active Problems:   Paraplegia (HCC)   Neurogenic bladder   Sacral decubitus ulcer, stage IV (HCC)   Iron deficiency anemia   Chronic indwelling Foley catheter   Bilateral leg edema   Chronic osteomyelitis due to infected scral decubitus ulcer, stage IV: Patient has some mild leukocytosis with WBC 12.5, temperature 99, normal lactic acid, clinically not septic.   Consulted general surgeon, Dr. Hampton Abbot is consulted. Dr. Rande Brunt of urology is also consulted --> he recommended to follow-up at [facility] for possible reconstructive procedure or division, no new recommendations today.  - Admitted to MedSurg bed as inpatient - Placed on MedSurg bed for observation - Empiric antimicrobial treatment with vancomycin and Rocephin - prn tylenol and Percocet for pain - Blood cultures x 2  - ESR and CRP - wound care consult   Bilateral leg edema: Etiology is not clear, likely due to positional change.  No history of CHF -LE doppler to r/o DVT -Check BNP  Paraplegia and neurogenic bladder: urinalysis showed cloudy appearance, large amount of leukocyte, none bacteria, WBC >50. Not sure if pt has new UTI. He may have chronic colonization. - Chronic indwelling Foley catheter - pt is on vanco and rocephin as above - f/u Urine culture  Iron deficiency anemia: Hgb 11.6 on 08/30/19 -->9.9 today. MCV 66.9. pt is not taking iron supplement -Restart ferrous sulfate -As needed Senokot for constipation     DVT ppx: SQ Lovenox Code Status: Full code Family Communication: not done, no family member is at bed side.  Disposition Plan:  Anticipate discharge back to previous home Consults called: Dr. Hampton Abbot of general surgery. Admission status: Med-surg bed as inpt       Status is: Inpatient  Remains inpatient appropriate because:Inpatient level of care appropriate due to severity of illness.  Patient has multiple comorbidities, now presents with worsening sacral ulcer with infection and osteomyelitis.  Patient has extensive sacral ulcer.  He is at high risk of deteriorating.  Need to be treated in hospital for at least 2 days.  Dispo: The patient is from: Home              Anticipated d/c is to: Home              Anticipated d/c date is: 2 days              Patient currently is not medically stable to d/c.          Date of Service 10/27/2019    Ivor Costa [facility]   If 7PM-7AM, please contact night-coverage [email or web address] 10/27/2019, 8:29 AM

## 2019-10-27 NOTE — ED Triage Notes (Signed)
Pt to the er for lower extremity swelling bilaterally. Pt states he has a hx of osteomyelitis. Pt states now he has bruising to the right 1st and 2nd digit.

## 2019-10-27 NOTE — Consult Note (Signed)
Pharmacy Antibiotic Note  Jonathon York is a 44 y.o. male admitted on 10/27/2019 with osteomyelitis due to infected sacral decubitus ulcer.  Pharmacy has been consulted for vancomycin dosing.  Plan: Patient received vancomycin 1g IV x 1 loading dose in ED.  Will order Vancomycin 750mg  IV every 12 hours based on [place] Antimicrobial Dosing Guidelines.   Will plan for vancomycin trough prior to 5th dose.   Pharmacy will monitor renal function and adjust dose as needed.   Height: 5\' 10"  (177.8 cm) Weight: 49.9 kg (110 lb) IBW/kg (Calculated) : 73  Temp (24hrs), Avg:99 F (37.2 C), Min:99 F (37.2 C), Max:99 F (37.2 C)  Recent Labs  Lab 10/27/19 0115 10/27/19 0357  WBC 12.2*  --   CREATININE 0.57*  --   LATICACIDVEN 1.9 1.4    Estimated Creatinine Clearance: 83.2 mL/min (A) (by C-G formula based on SCr of 0.57 mg/dL (L)).    Allergies  Allergen Reactions  . Orange Fruit [Citrus] Other (See Comments)    Blisters only from [place]!!! Patient is not allergic from all citrus    Antimicrobials this admission: 6/30 ceftriaxone >>  6/30 vancomycin >>   Microbiology results: 6/30 BCx: pending  6/30 UCx: pending   Thank you for allowing pharmacy to be a part of this patient's care.  7/30, PharmD, BCPS Clinical Pharmacist 10/27/2019 9:52 AM

## 2019-10-28 ENCOUNTER — Inpatient Hospital Stay: Payer: Medicare Other

## 2019-10-28 DIAGNOSIS — E440 Moderate protein-calorie malnutrition: Secondary | ICD-10-CM

## 2019-10-28 DIAGNOSIS — M8660 Other chronic osteomyelitis, unspecified site: Secondary | ICD-10-CM | POA: Diagnosis not present

## 2019-10-28 DIAGNOSIS — Z978 Presence of other specified devices: Secondary | ICD-10-CM | POA: Diagnosis not present

## 2019-10-28 DIAGNOSIS — L89154 Pressure ulcer of sacral region, stage 4: Secondary | ICD-10-CM | POA: Diagnosis not present

## 2019-10-28 DIAGNOSIS — N319 Neuromuscular dysfunction of bladder, unspecified: Secondary | ICD-10-CM | POA: Diagnosis not present

## 2019-10-28 LAB — BASIC METABOLIC PANEL
Anion gap: 8 (ref 5–15)
BUN: 9 mg/dL (ref 6–20)
CO2: 23 mmol/L (ref 22–32)
Calcium: 8.2 mg/dL — ABNORMAL LOW (ref 8.9–10.3)
Chloride: 107 mmol/L (ref 98–111)
Creatinine, Ser: 0.4 mg/dL — ABNORMAL LOW (ref 0.61–1.24)
GFR calc Af Amer: >60 mL/min (ref >60)
GFR calc non Af Amer: >60 mL/min (ref >60)
Glucose, Bld: 87 mg/dL (ref 70–99)
Potassium: 3.8 mmol/L (ref 3.5–5.1)
Sodium: 138 mmol/L (ref 135–145)

## 2019-10-28 LAB — CBC
HCT: 27.9 % — ABNORMAL LOW (ref 39.0–52.0)
Hemoglobin: 8.4 g/dL — ABNORMAL LOW (ref 13.0–17.0)
MCH: 20.9 pg — ABNORMAL LOW (ref 26.0–34.0)
MCHC: 30.1 g/dL (ref 30.0–36.0)
MCV: 69.4 fL — ABNORMAL LOW (ref 80.0–100.0)
Platelets: 354 10*3/uL (ref 150–400)
RBC: 4.02 MIL/uL — ABNORMAL LOW (ref 4.22–5.81)
RDW: 20.6 % — ABNORMAL HIGH (ref 11.5–15.5)
WBC: 8.7 10*3/uL (ref 4.0–10.5)
nRBC: 0 % (ref 0.0–0.2)

## 2019-10-28 LAB — URINE CULTURE

## 2019-10-28 MED ORDER — FERROUS SULFATE 325 (65 FE) MG PO TABS: 325.0000 mg | ORAL_TABLET | Freq: Every day | ORAL | 3 refills | Status: DC

## 2019-10-28 MED ORDER — ENSURE ENLIVE PO LIQD: 237.0000 mL | Freq: Three times a day (TID) | ORAL | Status: DC

## 2019-10-28 MED ORDER — DAKINS (1/4 STRENGTH) 0.125 % EX SOLN: 1.0000 "application " | Freq: Every morning | CUTANEOUS | 0 refills | Status: DC

## 2019-10-28 MED ORDER — ASCORBIC ACID 500 MG PO TABS: 500.0000 mg | ORAL_TABLET | Freq: Two times a day (BID) | ORAL | Status: DC

## 2019-10-28 MED ORDER — ASCORBIC ACID 500 MG PO TABS: 500.0000 mg | ORAL_TABLET | Freq: Two times a day (BID) | ORAL | 0 refills | Status: DC

## 2019-10-28 MED ORDER — OCUVITE-LUTEIN PO CAPS: 1.0000 | ORAL_CAPSULE | Freq: Every day | ORAL | Status: DC

## 2019-10-28 MED ORDER — HYDROCODONE-ACETAMINOPHEN 5-325 MG PO TABS: 1.0000 | ORAL_TABLET | Freq: Four times a day (QID) | ORAL | 0 refills | Status: DC | PRN

## 2019-10-28 MED ORDER — ENSURE ENLIVE PO LIQD: 237.0000 mL | Freq: Three times a day (TID) | ORAL | 12 refills | Status: DC

## 2019-10-28 NOTE — Discharge Instructions (Signed)
Advised to continue dressing changes as before.  Patient is scheduled virtual [place] visit with Dr. Sampson Goon next week.  He is also advised to follow-up with [facility] and urology for further reconstructive surgeries.

## 2019-10-28 NOTE — Care Management CC44 (Signed)
Condition Code 44 Documentation Completed  Patient Details  Name: Jonathon York MRN: [id number] Date of Birth: 20-Apr-1976   Condition Code 44 given:  Yes Patient signature on Condition Code 44 notice:  Yes Documentation of 2 MD's agreement:  Yes Code 44 added to claim:  Yes    Margarito Liner, LCSW 10/28/2019, 2:05 PM

## 2019-10-28 NOTE — Progress Notes (Signed)
Jonathon York to be D/C'd Home per MD order.  Discussed prescriptions and follow up appointments with the patient. Prescriptions given to patient, medication list explained in detail. Pt verbalized understanding.  Allergies as of 10/28/2019      Reactions   Orange Fruit [citrus] Other (See Comments)   Blisters only from ORANGES!!! Patient is not allergic from all citrus      Medication List    TAKE these medications   ascorbic acid 500 MG tablet Commonly known as: VITAMIN C Take 1 tablet (500 mg total) by mouth 2 (two) times daily.   feeding supplement (ENSURE ENLIVE) Liqd Take 237 mLs by mouth 3 (three) times daily between meals.   ferrous sulfate 325 (65 FE) MG tablet Take 1 tablet (325 mg total) by mouth daily with breakfast. Start taking on: October 29, 2019   HYDROcodone-acetaminophen 5-325 MG tablet Commonly known as: NORCO/VICODIN Take 1 tablet by mouth every 6 (six) hours as needed for moderate pain.   ibuprofen 200 MG tablet Commonly known as: ADVIL Take by mouth.   sodium hypochlorite 0.125 % Soln Commonly known as: DAKIN'S 1/4 STRENGTH Irrigate with 1 application as directed every morning. Start taking on: October 29, 2019            Discharge Care Instructions  (From admission, onward)         Start     Ordered   10/28/19 0000  Discharge wound care:       Comments: Pack left and right ischium and sacrum wounds Q day with Dakins moistened gauze, then cover with ABD pads and tape.   10/28/19 1346          Vitals:   10/28/19 0633 10/28/19 1159  BP: 106/71 112/76  Pulse: 82 (!) 102  Resp: 16 15  Temp: 98.6 F (37 C) 98.8 F (37.1 C)  SpO2: 98% 100%    Skin clean, dry and intact without evidence of skin break down, no evidence of skin tears noted. IV catheter discontinued intact. Site without signs and symptoms of complications. Dressing and pressure applied. Pt denies pain at this time. No complaints noted.  An After Visit Summary was printed and  given to the patient. Patient escorted via WC, and D/C home via private auto.  Madie Reno, RN

## 2019-10-28 NOTE — Progress Notes (Signed)
Initial Nutrition Assessment  DOCUMENTATION CODES:   Non-severe (moderate) malnutrition in context of chronic illness  INTERVENTION:   Ensure Enlive po TID, each supplement provides 350 kcal and 20 grams of protein  Ocuvite daily for wound healing (provides zinc, vitamin A, vitamin C, Vitamin E, copper, and selenium)  Vitamin C 500mg po BID  NUTRITION DIAGNOSIS:   Moderate Malnutrition related to chronic illness (chronic non-healing wounds, paraplegia) as evidenced by mild to moderate fat depletions, mild to moderate muscle depletions.   GOAL:   Patient will meet greater than or equal to 90% of their needs  MONITOR:   PO intake, Supplement acceptance, Labs, Weight trends, Skin, I & O's  REASON FOR ASSESSMENT:   Other (Comment) (Low BMI)    ASSESSMENT:   44 y.o. 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  Met with pt in room today. Pt is well known to this RD from multiple previous admits. Pt with good appetite and oral intake at baseline; pt generally is compliant with supplements while in hospital. Pt reports that he continues to drink supplements and take his vitamins at home. RD will add supplements and vitamins to help pt meet his estimated needs and support wound healing. Per chart, pt is weight stable at baseline.    Medications reviewed and include: lovenox, ferrous sulfate, melatonin, ceftriaxone   Labs reviewed: creat 0.40(L) Hgb 8.4(L), Hct 27.9(L), MCV 69.4(L), MCH 20.9(L)  NUTRITION - FOCUSED PHYSICAL EXAM:    Most Recent Value  Orbital Region Moderate depletion  Upper Arm Region Mild depletion  Thoracic and Lumbar Region Mild depletion  Buccal Region Moderate depletion  Temple Region Moderate depletion  Clavicle Bone Region  Moderate depletion  Clavicle and Acromion Bone Region Moderate depletion  Scapular Bone Region Moderate depletion  Dorsal Hand Mild depletion  Patellar Region Severe depletion  Anterior Thigh Region Severe depletion  Posterior Calf Region Severe depletion  Edema (RD Assessment) None  Hair Reviewed  Eyes Reviewed  Mouth Reviewed  Skin Reviewed  Nails Reviewed     Diet Order:   Diet Order            Diet regular Room service appropriate? Yes; Fluid consistency: Thin  Diet effective now                EDUCATION NEEDS:   Education needs have been addressed  Skin:  Skin Assessment: Reviewed RN Assessment (Stage IV sacrum & hips)  Last BM:  6/28  Height:   Ht Readings from Last 1 Encounters:  10/27/19 5' 10" (1.778 m)    Weight:   Wt Readings from Last 1 Encounters:  10/27/19 49.9 kg    Ideal Body Weight:  67.9 kg (adjusted for paraplegia)  BMI:  Body mass index is 15.78 kg/m.  Estimated Nutritional Needs:   Kcal:  1800-2100kcal/day  Protein:  90-105  Fluid:  1.5-1.8L/day    MS, RD, LDN Please refer to [facility] for RD and/or RD on-call/weekend/after hours pager  

## 2019-10-28 NOTE — Care Management Obs Status (Signed)
MEDICARE OBSERVATION STATUS NOTIFICATION   Patient Details  Name: Jonathon York MRN: [id number] Date of Birth: 29-Jun-1975   Medicare Observation Status Notification Given:  Yes    Margarito Liner, LCSW 10/28/2019, 2:05 PM

## 2019-10-28 NOTE — Consult Note (Addendum)
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  A consult was already performed yesterday; refer to progress note on 6/30 at 09:00. Topical treatment orders have been provided for bedside nurses to perform dressing changes daily with Dakins moistened gauze dressings.  Surgical team also performed a consult on 6/30 at 13:00 to assess wounds and removed/changed dressings at that time; refer to progress notes.  Bedside nurses ordered to perform topical treatments daily as follows: Pack left and right ischium and sacrum wounds Q day with Dakins moistened gauze, then cover with ABD pads and tape.  Please re-consult if further assistance is needed.  Thank-you,  Cammie Mcgee MSN, RN, CWOCN, [address], CNS [phone]

## 2019-10-28 NOTE — Clinical Social Work Note (Signed)
Patient has orders to discharge home today. He does not want home health at this time. He manages his own wound care. Patient drove to the hospital and will drive himself home. His wheelchair is in the room. No further concerns. CSW signing off.  Charlynn Court, CSW [phone]

## 2019-10-28 NOTE — Discharge Summary (Addendum)
[place] [facility] [facility] [place] NAME: Jonathon York    MR#:  [id number]  DATE OF BIRTH:  12/09/1975  DATE OF ADMISSION:  10/27/2019 ADMITTING PHYSICIAN: Fritzi Mandes, MD  DATE OF DISCHARGE: 10/28/2019 PRIMARY CARE PHYSICIAN: Leonel Ramsay, MD    ADMISSION DIAGNOSIS:  Pain [R52] Infected decubitus ulcer [L89.90, L08.9] Chronic osteomyelitis ([place]) [M86.60] Urinary tract infection in male [N39.0] Pressure injury of sacral region, stage 4 (HCC) [L89.154]  DISCHARGE DIAGNOSIS:  Chronic decubitus ulcers stage IV History of neurogenic bladder requiring chronic Foley catheter with history of chronic urine leak-- needs urology reconstructive surgery-- follow-up [facility]  SECONDARY DIAGNOSIS:   Past Medical History:  Diagnosis Date  . Anxiety   . Decubitus ulcer   . Depression   . Neurogenic bladder   . Osteomyelitis ([place])   . 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 male with medical history significant of paraplegia, chronic indwelling Foley catheter, sacral decubitus ulcer, chronic osteomyelitis, iron deficiency anemia, depression with anxiety, s/p of amputation ofright fourth and fifth toe, who presents with pain due to sacral ulcer.  Pt has chronic sacral decubitus ulcer. He has been treating themat home with wet-to-dry Dakin's.  Chronic osteomyelitis / sacral decubitus ulcer, stage IV: - Patient has some mild leukocytosis with WBC 12.5, temperature 99, normal lactic acid, clinically not septic. -  Consulted general surgeon, Dr. Hampton Abbot is consulted--per surgery wounds are not infected and looks good with granulation tissue -I will hold off abxs. -seen by wound care--cont Dankins solution dressing changes  -Dr. Rande Brunt of urology is also consulted --> he recommended to follow-up at [facility] for possible reconstructive procedure for chronic urine leakage. - prn tylenol and Percocet for  pain - Blood cultures x 2 --negative -UC mixed bact and has catheter--no fever or any urinary symtpoms- will hold of abxs - ESR and CRP mild elevated (likley due to chronic OM) - pt was a bit frustrated given his overall issue with chronic ulcers, chronic foley catherte and urine leakage. I spoke with his primary care physician Dr. Ola Spurr who will arrange a virtual visit with patient next week and try to work with [facility] urology and plastics to see if patient can be seen earlier for any reconstructive surgery that he can get. This was discussed with patient is agreeable to it and appreciate the help. -Patient knows he will not be discharged on any antibiotics since workup so far is negative no active infection is noted. However he will discussed with Dr. Ola Spurr. Patient has had some side effects with antibiotics in the past according to him.   Bilateral leg edema: Etiology is not clear, likely due to positional change.  No history of CHF -LE doppler negative for DVT  Paraplegia and neurogenic bladder: urinalysis showed cloudy appearance, large amount of leukocyte, none bacteria, WBC >50.  He likely has  chronic colonization. - Chronic indwelling Foley catheter - f/u Urine culture-- multiple species recommends recollection  Iron deficiency anemia: Hgb 11.6 on 08/30/19 -->9.9 today. MCV 66.9. pt is not taking iron supplement -Restart ferrous sulfate -As needed Senokot for constipation  Nutrition Status: Nutrition Problem: Moderate Malnutrition Etiology: chronic illness (chronic non-healing wounds, paraplegia) Signs/Symptoms: mild fat depletion, mild muscle depletion, moderate muscle depletion, moderate fat depletion Interventions: Ensure Enlive (each supplement provides 350kcal and 20 grams of protein), MVI    overall patient is at baseline. Discussed with him. He  will discharged to home. He will call Dr. Ola Spurr next week for follow-up appointment. He will discuss further  surgical options with [facility] plastics and urology  Patient will continue his dressing changes as before.   DVT ppx: SQ Lovenox Code Status: Full code Family Communication: not done, no family member is at bed side.  Disposition Plan:  Anticipate discharge back to previous home Consults called: Dr. Hampton Abbot of general surgery. Admission status: Med-surg bed as inpt      CONSULTS OBTAINED:    DRUG ALLERGIES:   Allergies  Allergen Reactions  . Orange Fruit [Citrus] Other (See Comments)    Blisters only from [place]!!! Patient is not allergic from all citrus    DISCHARGE MEDICATIONS:   Allergies as of 10/28/2019      Reactions   Orange Fruit [citrus] Other (See Comments)   Blisters only from [place]!!! Patient is not allergic from all citrus      Medication List    TAKE these medications   ascorbic acid 500 MG tablet Commonly known as: VITAMIN C Take 1 tablet (500 mg total) by mouth 2 (two) times daily.   feeding supplement (ENSURE ENLIVE) Liqd Take 237 mLs by mouth 3 (three) times daily between meals.   ferrous sulfate 325 (65 FE) MG tablet Take 1 tablet (325 mg total) by mouth daily with breakfast. Start taking on: October 29, 2019   HYDROcodone-acetaminophen 5-325 MG tablet Commonly known as: NORCO/VICODIN Take 1 tablet by mouth every 6 (six) hours as needed for moderate pain.   ibuprofen 200 MG tablet Commonly known as: ADVIL Take by mouth.   sodium hypochlorite 0.125 % Soln Commonly known as: DAKIN'S 1/4 STRENGTH Irrigate with 1 application as directed every morning. Start taking on: October 29, 2019            Discharge Care Instructions  (From admission, onward)         Start     Ordered   10/28/19 0000  Discharge wound care:       Comments: Pack left and right ischium and sacrum wounds Q day with Dakins moistened gauze, then cover with ABD pads and tape.   10/28/19 1346          If you experience worsening of your admission symptoms, develop  shortness of breath, life threatening emergency, suicidal or homicidal thoughts you must seek medical attention immediately by calling 911 or calling your MD immediately  if symptoms less severe.  You Must read complete instructions/literature along with all the possible adverse reactions/side effects for all the Medicines you take and that have been prescribed to you. Take any new Medicines after you have completely understood and accept all the possible adverse reactions/side effects.   Please note  You were cared for by a hospitalist during your hospital stay. If you have any questions about your discharge medications or the care you received while you were in the hospital after you are discharged, you can call the unit and asked to speak with the hospitalist on call if the hospitalist that took care of you is not available. Once you are discharged, your primary care physician will handle any further medical issues. Please note that NO REFILLS for any discharge medications will be authorized once you are discharged, as it is imperative that you return to your primary care physician (or establish a relationship with a primary care physician if you do not have one) for your aftercare needs so that they can reassess your need for  medications and monitor your lab values. Today   SUBJECTIVE  patient was earlier a bit frustrated with trying to figure out how to get rid of his chronic osteomyelitis. Had a long conversation with him. He seems to be doing okay. No fever. Ate good lunch. Complains of sacral pain.  VITAL SIGNS:  Blood pressure 112/76, pulse (!) 102, temperature 98.8 F (37.1 C), temperature source Oral, resp. rate 15, height 5' 10"  (1.778 m), weight 49.9 kg, SpO2 100 %.  I/O:    Intake/Output Summary (Last 24 hours) at 10/28/2019 1349 Last data filed at 10/28/2019 0958 Gross per 24 hour  Intake 510 ml  Output 625 ml  Net -115 ml    PHYSICAL EXAMINATION:   General: Not in acute  distress HEENT:       Eyes: PERRL, EOMI, no scleral icterus.       ENT: No discharge from the ears and nose, no pharynx injection, no tonsillar enlargement.        Neck: No JVD, no bruit, no mass felt. Heme: No neck lymph node enlargement. Cardiac: S1/S2, RRR, No murmurs, No gallops or rubs. Respiratory: No rales, wheezing, rhonchi or rubs. GI: Soft, nondistended, nontender, no rebound pain, no organomegaly, BS present. GU: No hematuria Ext: has 1+ pitting leg edema bilaterally. 1+DP/PT pulse bilaterally. 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 Skin: has extensive, stage IV sacral ulcers, no purulence drainage/or foul smelling odor or drainage noted      Neuro: Alert, oriented X3, cranial nerves II-XII grossly intact. Has paraplegia DATA REVIEW:   CBC  Recent Labs  Lab 10/28/19 0404  WBC 8.7  HGB 8.4*  HCT 27.9*  PLT 354    Chemistries  Recent Labs  Lab 10/27/19 0115 10/27/19 0115 10/28/19 0404  NA 139   < > 138  K 3.5   < > 3.8  CL 103   < > 107  CO2 26   < > 23  GLUCOSE 97   < > 87  BUN 10   < > 9  CREATININE 0.57*   < > 0.40*  CALCIUM 8.6*   < > 8.2*  AST 19  --   --   ALT 19  --   --   ALKPHOS 63  --   --   BILITOT 0.3  --   --    < > = values in this interval not displayed.    Microbiology Results   Recent Results (from the past 240 hour(s))  Culture, blood (routine x 2)     Status: None (Preliminary result)   Collection Time: 10/27/19  4:38 AM   Specimen: Left Antecubital; Blood  Result Value Ref Range Status   Specimen Description LEFT ANTECUBITAL  Final   Special Requests   Final    BOTTLES DRAWN AEROBIC AND ANAEROBIC Blood Culture results may not be optimal due to an excessive volume of blood received in culture bottles   Culture   Final    NO GROWTH 1 DAY Performed at [facility], [address] [place], [place] [address]    Report Status PENDING  Incomplete  Culture, blood (routine x 2)     Status: None (Preliminary result)   Collection Time: 10/27/19  4:38 AM   Specimen: BLOOD RIGHT HAND  Result Value Ref Range Status   Specimen Description BLOOD RIGHT HAND  Final   Special Requests   Final    BOTTLES DRAWN AEROBIC AND ANAEROBIC Blood Culture results may not be optimal due to an excessive volume of blood received in culture bottles   Culture   Final    NO GROWTH 1 DAY Performed at [facility], [name] [place], [place] [address]    Report Status PENDING  Incomplete  SARS Coronavirus 2 by RT PCR (hospital order, performed in [facility] hospital lab) Nasopharyngeal Nasopharyngeal Swab     Status: None   Collection Time: 10/27/19  7:20 AM   Specimen: Nasopharyngeal Swab  Result Value Ref Range Status   SARS Coronavirus 2 NEGATIVE NEGATIVE Final    Comment: (NOTE) SARS-CoV-2 target nucleic acids are NOT DETECTED.  The SARS-CoV-2 RNA is generally detectable in upper and lower respiratory specimens during the acute phase of infection. The lowest concentration of SARS-CoV-2 viral copies this assay can detect is 250 copies / mL. A negative result does not preclude SARS-CoV-2 infection and should not be used as the sole basis for treatment or other patient management decisions.  A negative result may occur with improper specimen collection / handling, submission of specimen other than nasopharyngeal swab, presence of viral mutation(s) within the areas targeted by this assay, and inadequate number of viral copies (<250 copies / mL). A negative result must be combined with clinical observations, patient history, and epidemiological information.  Fact Sheet for Patients:   StrictlyIdeas.no  Fact Sheet for Healthcare Providers: BankingDealers.co.za  This test is not yet approved or  cleared by the [place] FDA  and has been authorized for detection and/or diagnosis of SARS-CoV-2 by FDA under an Emergency Use Authorization (EUA).  This EUA will remain in effect (meaning this test can be used) for the duration of the COVID-19 declaration under Section 564(b)(1) of the Act, 21 U.S.C. section 360bbb-3(b)(1), unless the authorization is terminated or revoked sooner.  Performed at [facility], [address] [place], [place] [address]   Urine Culture     Status: Abnormal   Collection Time: 10/27/19  8:38 AM   Specimen: Urine, Random  Result Value Ref Range Status   Specimen Description   Final    URINE, RANDOM Performed at [facility], [address] [place], [place] [address]    Special Requests   Final    NONE Performed at [facility], [address] [place], [place] [address]    Culture MULTIPLE SPECIES PRESENT, SUGGEST RECOLLECTION (A)  Final   Report Status 10/28/2019 FINAL  Final    RADIOLOGY:  CT Abdomen Pelvis W Contrast  Result Date: 10/27/2019 CLINICAL DATA:  Sacral decubitus ulcer with urine drainage EXAM: CT ABDOMEN AND PELVIS WITH CONTRAST TECHNIQUE: Multidetector CT imaging of the abdomen and pelvis was performed using the standard protocol following bolus administration of intravenous contrast. CONTRAST:  57m OMNIPAQUE IOHEXOL 300 MG/ML  SOLN COMPARISON:  08/30/2019 FINDINGS: Lower chest:  No acute finding Hepatobiliary: Tiny right hepatic cystic density.No evidence of biliary obstruction or stone. Pancreas: Unremarkable. Spleen: Unremarkable. Adrenals/Urinary Tract: Negative adrenals. No hydronephrosis or stone. Small left renal cystic densities. Foley catheter in place and there is complete collapse of the bladder. The patient's decubitus ulcer tunnels from right para median to central at the level of the posterior urethra with gas encompassing the urinary catheter. Stomach/Bowel:  No obstruction. No visible bowel inflammation Vascular/Lymphatic:  Atherosclerotic calcification of the aorta and iliacs. Chronic inguinal and pelvic adenopathy that is considered  reactive. Reproductive:Perineal ulceration. Other: No ascites or pneumoperitoneum. Musculoskeletal: Thoracic spinal fusion with subjacent severe muscular atrophy. Extensive bilateral sacral and ischial decubitus ulcer with extensive lower sacral and bilateral ischial/pubic body erosions and sclerosis. No collection. No acute fracture. IMPRESSION: 1. Extensive sacral and ischial decubitus ulcers with progressive ulceration at the perineum which extends to the posterior urethra with gas encompassing the Foley catheter, correlating with history of urine leak. Chronic osteomyelitis and extensive pelvic erosion. No abscess. 2. No acute intra-abdominal finding Electronically Signed   By: Monte Fantasia M.D.   On: 10/27/2019 05:44   US Venous Img Lower Bilateral (DVT)  Result Date: 10/28/2019 CLINICAL DATA:  44 year old male with a history of bilateral leg edema EXAM: BILATERAL LOWER EXTREMITY VENOUS DOPPLER ULTRASOUND TECHNIQUE: Gray-scale sonography with graded compression, as well as color Doppler and duplex ultrasound were performed to evaluate the lower extremity deep venous systems from the level of the common femoral vein and including the common femoral, femoral, profunda femoral, popliteal and calf veins including the posterior tibial, peroneal and gastrocnemius veins when visible. The superficial great saphenous vein was also interrogated. Spectral Doppler was utilized to evaluate flow at rest and with distal augmentation maneuvers in the common femoral, femoral and popliteal veins. COMPARISON:  None. FINDINGS: RIGHT LOWER EXTREMITY Common Femoral Vein: No evidence of thrombus. Normal compressibility, respiratory phasicity and response to augmentation. Saphenofemoral Junction: No evidence of thrombus. Normal compressibility and flow on color Doppler imaging. Profunda Femoral Vein: No evidence of  thrombus. Normal compressibility and flow on color Doppler imaging. Femoral Vein: No evidence of thrombus. Normal compressibility, respiratory phasicity and response to augmentation. Popliteal Vein: No evidence of thrombus. Normal compressibility, respiratory phasicity and response to augmentation. Calf Veins: No evidence of thrombus. Normal compressibility and flow on color Doppler imaging. Superficial Great Saphenous Vein: No evidence of thrombus. Normal compressibility and flow on color Doppler imaging. Other Findings:  None. LEFT LOWER EXTREMITY Common Femoral Vein: No evidence of thrombus. Normal compressibility, respiratory phasicity and response to augmentation. Saphenofemoral Junction: No evidence of thrombus. Normal compressibility and flow on color Doppler imaging. Profunda Femoral Vein: No evidence of thrombus. Normal compressibility and flow on color Doppler imaging. Femoral Vein: No evidence of thrombus. Normal compressibility, respiratory phasicity and response to augmentation. Popliteal Vein: No evidence of thrombus. Normal compressibility, respiratory phasicity and response to augmentation. Calf Veins: No evidence of thrombus. Normal compressibility and flow on color Doppler imaging. Superficial Great Saphenous Vein: No evidence of thrombus. Normal compressibility and flow on color Doppler imaging. Other Findings:  None. IMPRESSION: Sonographic survey of the bilateral lower extremities negative for DVT Electronically Signed   By: Corrie Mckusick D.O.   On: 10/28/2019 11:44   DG Foot Complete Right  Result Date: 10/27/2019 CLINICAL DATA:  First and second digit bruising, history of osteomyelitis and amputation, edema EXAM: RIGHT FOOT COMPLETE - 3+ VIEW COMPARISON:  04/22/2018 FINDINGS: Frontal, oblique, and lateral views of the right foot are obtained. Previous amputation at the first interphalangeal joint, fourth metacarpal phalangeal joint, and proximal fifth metacarpal are seen. The bones are  diffusely osteopenic. There are no acute or destructive bony lesions. Diffuse soft tissue swelling is noted. IMPRESSION: 1. Postsurgical changes as above. 2. Severe osteopenia.  No evidence of osteomyelitis. Electronically Signed   By: Randa Ngo M.D.   On: 10/27/2019 01:59     CODE STATUS:     Code Status Orders  (From admission, onward)         Start  Ordered   10/27/19 2117  Full code  Continuous        10/27/19 2116        Code Status History    Date Active Date Inactive Code Status Order ID Comments User Context   07/13/2019 1627 07/16/2019 2003 Full Code [id number]  Wyvonnia Dusky, MD ED   11/26/2018 0411 11/30/2018 2011 Full Code [id number]  Loletha Grayer, MD ED   04/22/2018 2308 04/23/2018 1622 Full Code [id number]  Lance Coon, MD Inpatient   03/18/2018 1119 03/23/2018 1659 Full Code [id number]  Henreitta Leber, MD ED   02/14/2017 2321 02/19/2017 2046 Full Code [id number]  Lance Coon, MD Inpatient   11/19/2016 1720 11/21/2016 2207 Full Code [id number]  Dustin Flock, MD Inpatient   04/15/2016 1802 04/19/2016 0211 Full Code [id number]  Demetrios Loll, MD Inpatient   04/29/2015 1017 04/30/2015 1339 Full Code [id number]  Sharlotte Alamo, MD Inpatient   04/28/2015 0438 04/29/2015 1017 Full Code [id number]  Hillary Bow, MD ED   Advance Care Planning Activity       TOTAL TIME TAKING CARE OF THIS PATIENT: *40* minutes.    Fritzi Mandes M.D  [facility]    CC: Primary care physician; Leonel Ramsay, MD

## 2019-11-01 LAB — CULTURE, BLOOD (ROUTINE X 2)
Culture: NO GROWTH
Culture: NO GROWTH

## 2019-12-01 ENCOUNTER — Inpatient Hospital Stay
Admission: EM | Admit: 2019-12-01 | Discharge: 2019-12-05 | DRG: 698 | Disposition: A | Discharge status: Home / Self Care | Payer: Medicare Other | Attending: Internal Medicine | Admitting: Internal Medicine

## 2019-12-01 ENCOUNTER — Emergency Department: Payer: Medicare Other

## 2019-12-01 ENCOUNTER — Encounter: Payer: Self-pay | Admitting: Emergency Medicine

## 2019-12-01 ENCOUNTER — Other Ambulatory Visit: Payer: Self-pay

## 2019-12-01 DIAGNOSIS — L89154 Pressure ulcer of sacral region, stage 4: Secondary | ICD-10-CM | POA: Diagnosis not present

## 2019-12-01 DIAGNOSIS — G8220 Paraplegia, unspecified: Secondary | ICD-10-CM | POA: Diagnosis not present

## 2019-12-01 DIAGNOSIS — L89314 Pressure ulcer of right buttock, stage 4: Secondary | ICD-10-CM | POA: Diagnosis present

## 2019-12-01 DIAGNOSIS — T83518A Infection and inflammatory reaction due to other urinary catheter, initial encounter: Secondary | ICD-10-CM | POA: Diagnosis not present

## 2019-12-01 DIAGNOSIS — Z79899 Other long term (current) drug therapy: Secondary | ICD-10-CM

## 2019-12-01 DIAGNOSIS — T83511S Infection and inflammatory reaction due to indwelling urethral catheter, sequela: Secondary | ICD-10-CM | POA: Diagnosis not present

## 2019-12-01 DIAGNOSIS — N319 Neuromuscular dysfunction of bladder, unspecified: Secondary | ICD-10-CM | POA: Diagnosis present

## 2019-12-01 DIAGNOSIS — Z89421 Acquired absence of other right toe(s): Secondary | ICD-10-CM

## 2019-12-01 DIAGNOSIS — Z91018 Allergy to other foods: Secondary | ICD-10-CM

## 2019-12-01 DIAGNOSIS — Y846 Urinary catheterization as the cause of abnormal reaction of the patient, or of later complication, without mention of misadventure at the time of the procedure: Secondary | ICD-10-CM | POA: Diagnosis present

## 2019-12-01 DIAGNOSIS — D509 Iron deficiency anemia, unspecified: Secondary | ICD-10-CM | POA: Diagnosis present

## 2019-12-01 DIAGNOSIS — L89159 Pressure ulcer of sacral region, unspecified stage: Secondary | ICD-10-CM | POA: Diagnosis present

## 2019-12-01 DIAGNOSIS — Z20822 Contact with and (suspected) exposure to covid-19: Secondary | ICD-10-CM | POA: Diagnosis present

## 2019-12-01 DIAGNOSIS — R112 Nausea with vomiting, unspecified: Secondary | ICD-10-CM

## 2019-12-01 DIAGNOSIS — Z681 Body mass index (BMI) 19 or less, adult: Secondary | ICD-10-CM

## 2019-12-01 DIAGNOSIS — B952 Enterococcus as the cause of diseases classified elsewhere: Secondary | ICD-10-CM | POA: Diagnosis present

## 2019-12-01 DIAGNOSIS — Z807 Family history of other malignant neoplasms of lymphoid, hematopoietic and related tissues: Secondary | ICD-10-CM

## 2019-12-01 DIAGNOSIS — M4628 Osteomyelitis of vertebra, sacral and sacrococcygeal region: Secondary | ICD-10-CM | POA: Diagnosis present

## 2019-12-01 DIAGNOSIS — L89324 Pressure ulcer of left buttock, stage 4: Secondary | ICD-10-CM | POA: Diagnosis present

## 2019-12-01 DIAGNOSIS — D72829 Elevated white blood cell count, unspecified: Secondary | ICD-10-CM | POA: Diagnosis present

## 2019-12-01 DIAGNOSIS — Z87891 Personal history of nicotine dependence: Secondary | ICD-10-CM

## 2019-12-01 DIAGNOSIS — N390 Urinary tract infection, site not specified: Secondary | ICD-10-CM | POA: Diagnosis present

## 2019-12-01 DIAGNOSIS — N3000 Acute cystitis without hematuria: Secondary | ICD-10-CM

## 2019-12-01 DIAGNOSIS — T83511A Infection and inflammatory reaction due to indwelling urethral catheter, initial encounter: Secondary | ICD-10-CM | POA: Diagnosis present

## 2019-12-01 DIAGNOSIS — E876 Hypokalemia: Secondary | ICD-10-CM | POA: Diagnosis present

## 2019-12-01 DIAGNOSIS — E43 Unspecified severe protein-calorie malnutrition: Secondary | ICD-10-CM | POA: Diagnosis present

## 2019-12-01 DIAGNOSIS — R000 Tachycardia, unspecified: Secondary | ICD-10-CM | POA: Diagnosis present

## 2019-12-01 DIAGNOSIS — E860 Dehydration: Secondary | ICD-10-CM | POA: Diagnosis present

## 2019-12-01 DIAGNOSIS — E440 Moderate protein-calorie malnutrition: Secondary | ICD-10-CM | POA: Diagnosis present

## 2019-12-01 DIAGNOSIS — L89304 Pressure ulcer of unspecified buttock, stage 4: Secondary | ICD-10-CM

## 2019-12-01 LAB — URINALYSIS, COMPLETE (UACMP) WITH MICROSCOPIC
Bilirubin Urine: NEGATIVE
Glucose, UA: NEGATIVE mg/dL
Ketones, ur: 80 mg/dL — AB
Nitrite: NEGATIVE
Protein, ur: >=300 mg/dL — AB
RBC / HPF: >50 RBC/hpf — ABNORMAL HIGH (ref 0–5)
Specific Gravity, Urine: 1.033 — ABNORMAL HIGH (ref 1.005–1.030)
WBC, UA: >50 WBC/hpf — ABNORMAL HIGH (ref 0–5)
pH: 6 (ref 5.0–8.0)

## 2019-12-01 LAB — COMPREHENSIVE METABOLIC PANEL
ALT: 11 U/L (ref 0–44)
AST: 13 U/L — ABNORMAL LOW (ref 15–41)
Albumin: 4.1 g/dL (ref 3.5–5.0)
Alkaline Phosphatase: 84 U/L (ref 38–126)
Anion gap: 22 — ABNORMAL HIGH (ref 5–15)
BUN: 16 mg/dL (ref 6–20)
CO2: 15 mmol/L — ABNORMAL LOW (ref 22–32)
Calcium: 9 mg/dL (ref 8.9–10.3)
Chloride: 100 mmol/L (ref 98–111)
Creatinine, Ser: 0.61 mg/dL (ref 0.61–1.24)
GFR calc Af Amer: >60 mL/min (ref >60)
GFR calc non Af Amer: >60 mL/min (ref >60)
Glucose, Bld: 113 mg/dL — ABNORMAL HIGH (ref 70–99)
Potassium: 3.6 mmol/L (ref 3.5–5.1)
Sodium: 137 mmol/L (ref 135–145)
Total Bilirubin: 1.5 mg/dL — ABNORMAL HIGH (ref 0.3–1.2)
Total Protein: 8.6 g/dL — ABNORMAL HIGH (ref 6.5–8.1)

## 2019-12-01 LAB — CBC
HCT: 42.5 % (ref 39.0–52.0)
Hemoglobin: 12.6 g/dL — ABNORMAL LOW (ref 13.0–17.0)
MCH: 20.4 pg — ABNORMAL LOW (ref 26.0–34.0)
MCHC: 29.6 g/dL — ABNORMAL LOW (ref 30.0–36.0)
MCV: 68.9 fL — ABNORMAL LOW (ref 80.0–100.0)
Platelets: 547 10*3/uL — ABNORMAL HIGH (ref 150–400)
RBC: 6.17 MIL/uL — ABNORMAL HIGH (ref 4.22–5.81)
RDW: 21.2 % — ABNORMAL HIGH (ref 11.5–15.5)
WBC: 15.4 10*3/uL — ABNORMAL HIGH (ref 4.0–10.5)
nRBC: 0 % (ref 0.0–0.2)

## 2019-12-01 LAB — LACTIC ACID, PLASMA
Lactic Acid, Venous: 1.7 mmol/L (ref 0.5–1.9)
Lactic Acid, Venous: 1.1 mmol/L (ref 0.5–1.9)

## 2019-12-01 LAB — LIPASE, BLOOD: Lipase: 20 U/L (ref 11–51)

## 2019-12-01 LAB — SARS CORONAVIRUS 2 BY RT PCR (HOSPITAL ORDER, PERFORMED IN ~~LOC~~ HOSPITAL LAB): SARS Coronavirus 2: NEGATIVE

## 2019-12-01 MED ORDER — MORPHINE SULFATE (PF) 2 MG/ML IV SOLN
2.0000 mg | INTRAVENOUS | Status: DC | PRN
  Administered 2019-12-01 – 2019-12-02 (×3): 2 mg via INTRAVENOUS
  Filled 2019-12-01 (×4): qty 1

## 2019-12-01 MED ORDER — ONDANSETRON HCL 4 MG/2ML IJ SOLN
4.0000 mg | Freq: Once | INTRAMUSCULAR | Status: AC
  Administered 2019-12-01: 4 mg via INTRAVENOUS
  Filled 2019-12-01: qty 2

## 2019-12-01 MED ORDER — MORPHINE SULFATE (PF) 4 MG/ML IV SOLN
INTRAVENOUS | Status: AC
  Administered 2019-12-01: 4 mg via INTRAVENOUS
  Filled 2019-12-01: qty 1

## 2019-12-01 MED ORDER — ONDANSETRON HCL 4 MG PO TABS: 4.0000 mg | ORAL_TABLET | Freq: Four times a day (QID) | ORAL | Status: DC | PRN

## 2019-12-01 MED ORDER — OXYCODONE-ACETAMINOPHEN 5-325 MG PO TABS
1.0000 | ORAL_TABLET | Freq: Once | ORAL | Status: AC
  Administered 2019-12-01: 1 via ORAL
  Filled 2019-12-01: qty 1

## 2019-12-01 MED ORDER — MORPHINE SULFATE (PF) 2 MG/ML IV SOLN
2.0000 mg | Freq: Once | INTRAVENOUS | Status: AC
  Administered 2019-12-01: 2 mg via INTRAVENOUS
  Filled 2019-12-01: qty 1

## 2019-12-01 MED ORDER — ENSURE ENLIVE PO LIQD
237.0000 mL | Freq: Three times a day (TID) | ORAL | Status: DC
  Administered 2019-12-01 – 2019-12-02 (×4): 237 mL via ORAL

## 2019-12-01 MED ORDER — SODIUM CHLORIDE 0.9 % IV SOLN
1.0000 g | Freq: Once | INTRAVENOUS | Status: AC
  Administered 2019-12-01: 1 g via INTRAVENOUS
  Filled 2019-12-01: qty 10

## 2019-12-01 MED ORDER — HYDROCODONE-ACETAMINOPHEN 5-325 MG PO TABS: 1.0000 | ORAL_TABLET | Freq: Four times a day (QID) | ORAL | Status: DC | PRN

## 2019-12-01 MED ORDER — ONDANSETRON HCL 4 MG/2ML IJ SOLN: 4.0000 mg | Freq: Four times a day (QID) | INTRAMUSCULAR | Status: DC | PRN

## 2019-12-01 MED ORDER — FERROUS SULFATE 325 (65 FE) MG PO TABS
325.0000 mg | ORAL_TABLET | Freq: Every day | ORAL | Status: DC
  Administered 2019-12-01 – 2019-12-05 (×5): 325 mg via ORAL
  Filled 2019-12-01 (×5): qty 1

## 2019-12-01 MED ORDER — ACETAMINOPHEN 325 MG PO TABS: 650.0000 mg | ORAL_TABLET | Freq: Four times a day (QID) | ORAL | Status: DC | PRN

## 2019-12-01 MED ORDER — ASCORBIC ACID 500 MG PO TABS
500.0000 mg | ORAL_TABLET | Freq: Two times a day (BID) | ORAL | Status: DC
  Administered 2019-12-01 – 2019-12-05 (×8): 500 mg via ORAL
  Filled 2019-12-01 (×9): qty 1

## 2019-12-01 MED ORDER — SODIUM CHLORIDE 0.9 % IV SOLN: INTRAVENOUS | Status: DC

## 2019-12-01 MED ORDER — MORPHINE SULFATE (PF) 4 MG/ML IV SOLN: 4.0000 mg | Freq: Once | INTRAVENOUS | Status: AC

## 2019-12-01 MED ORDER — SODIUM CHLORIDE 0.9 % IV BOLUS
1000.0000 mL | Freq: Once | INTRAVENOUS | Status: AC
  Administered 2019-12-01: 1000 mL via INTRAVENOUS

## 2019-12-01 MED ORDER — IOHEXOL 300 MG/ML  SOLN
75.0000 mL | Freq: Once | INTRAMUSCULAR | Status: AC | PRN
  Administered 2019-12-01: 75 mL via INTRAVENOUS

## 2019-12-01 MED ORDER — DAKINS (1/4 STRENGTH) 0.125 % EX SOLN
1.0000 "application " | Freq: Every morning | CUTANEOUS | Status: AC
  Administered 2019-12-01 – 2019-12-03 (×2): 1
  Filled 2019-12-01: qty 473

## 2019-12-01 MED ORDER — ACETAMINOPHEN 650 MG RE SUPP: 650.0000 mg | Freq: Four times a day (QID) | RECTAL | Status: DC | PRN

## 2019-12-01 MED ORDER — SODIUM CHLORIDE 0.9 % IV SOLN
1.0000 g | INTRAVENOUS | Status: DC
  Administered 2019-12-02 – 2019-12-04 (×3): 1 g via INTRAVENOUS
  Filled 2019-12-01: qty 1
  Filled 2019-12-01: qty 10
  Filled 2019-12-01 (×2): qty 1

## 2019-12-01 MED ORDER — ENOXAPARIN SODIUM 30 MG/0.3ML ~~LOC~~ SOLN
30.0000 mg | SUBCUTANEOUS | Status: DC
  Administered 2019-12-01 – 2019-12-02 (×2): 30 mg via SUBCUTANEOUS
  Filled 2019-12-01 (×3): qty 0.3

## 2019-12-01 MED ORDER — ENOXAPARIN SODIUM 40 MG/0.4ML ~~LOC~~ SOLN: 40.0000 mg | SUBCUTANEOUS | Status: DC

## 2019-12-01 NOTE — ED Triage Notes (Signed)
Pt presents to ED from home via [facility] for c/o abd pain & N/V sx. Pt st that these sx started 3days ago after eating cookout. Pt denies any OTC tx/ relief of sx./ Pt A&Ox4./  Pt was given 4mg  of Zofran in route around 01:18am. 18g IV placed in LT AC. 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

## 2019-12-01 NOTE — H&P (Signed)
History and Physical    Jonathon York TXM:[id number] DOB: [birth year] DOA: 12/01/2019  PCP: Mick Sell, MD   Patient coming from: Home  I have personally briefly reviewed patient's old medical records in [facility]  Chief Complaint: Nausea/Vomiting  HPI: Jonathon York is a 44 y.o. male with medical history significant for paraplegia, chronic osteomyelitis, chronic decubitus ulcers, neurogenic bladder with chronic indwelling Foley who presents to the emergency room for evaluation of nausea, vomiting and abdominal discomfort.  Patient states that he ate at a cookout and subsequently started experiencing these symptoms.  Abdominal pain was mostly periumbilical, nonradiating and was persistent.  He had no relieving or aggravating factors.  It was associated with nausea vomiting but he denies having any diarrhea, no fever, no chills. He denies having any chest pain, no shortness of breath, no dizziness, no lightheadedness, no cough Labs reveal a white count of 15.4, hemoglobin of 12.6, MCV of 68.9, serum bicarb of 15 with an anion gap of 22. Vital signs show T-max of 99.1, he was tachycardic with heart rate of 110, tachypneic with respiratory rate of 23. Patient also has significant pyuria CT scan of abdomen and pelvis is negative for bowel obstruction or visible inflammation. Sequela of paralysis including chronic decubitus ulcers. The right ischial ulcer is again seen to extend to the posterior urethra. 12 Lead EKG reviewed by me shows sinus tachycardia.  ED Course: Patient is a 44 year old male who is paraplegic and has a neurogenic bladder and chronic indwelling Foley catheter.  He presents to the emergency room for evaluation of nausea, vomiting and abdominal pain.  He has a serum bicarb level of 15 with an anion gap of 22 and also has pyuria.  He will be admitted to the hospital for further evaluation.  Review of Systems: As per HPI otherwise 10 point review of systems  negative.    Past Medical History:  Diagnosis Date  . Anxiety   . Decubitus ulcer   . Depression   . Neurogenic bladder   . Osteomyelitis (HCC)   . 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 AMPUTATION Right 03/19/2018   Procedure: 5th Metatarsal Resection;  Surgeon: Recardo Evangelist, DPM;  Location: [facility] ORS;  Service: Podiatry;  Laterality: Right;  . 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 IRRIGATION AND DEBRIDEMENT BUTTOCKS    . TOE AMPUTATION Right      reports that he has quit smoking. His smoking use included cigarettes. He smoked 0.50 packs per day. He has never used smokeless tobacco. He reports current drug use. Drug: Marijuana. He reports that he does not drink alcohol.  Allergies  Allergen Reactions  . Orange Fruit [Citrus] Other (See Comments)    Blisters only from ORANGES!!! Patient is not allergic from all citrus    Family History  Problem Relation Age of Onset  . Lymphoma Sister      Prior to Admission medications   Medication Sig Start Date End Date Taking? Authorizing Provider  ascorbic acid (VITAMIN C) 500 MG tablet Take 1 tablet (500 mg total) by mouth 2 (two) times daily. 10/28/19   Enedina Finner, MD  feeding supplement, ENSURE ENLIVE, (ENSURE ENLIVE) LIQD Take 237 mLs by mouth 3 (three) times daily between meals. 10/28/19   Enedina Finner, MD  ferrous sulfate 325 (65  FE) MG tablet Take 1 tablet (325 mg total) by mouth daily with breakfast. 10/29/19   Enedina Finner, MD  HYDROcodone-acetaminophen (NORCO/VICODIN) 5-325 MG tablet Take 1 tablet by mouth every 6 (six) hours as needed for moderate pain. 10/28/19   Enedina Finner, MD  ibuprofen (ADVIL) 200 MG tablet Take by mouth.    [provider]  sodium hypochlorite (DAKIN'S 1/4 STRENGTH) 0.125 % SOLN Irrigate with 1 application as directed every  morning. 10/29/19   Enedina Finner, MD    Physical Exam: Vitals:   12/01/19 0126 12/01/19 0128 12/01/19 0549 12/01/19 0700  BP: 116/83  120/69 (!) 120/50  Pulse: (!) 110     Resp: (!) 23  12 11   Temp: 99.1 F (37.3 C)     TempSrc: Oral     SpO2: 99%     Weight:  49.9 kg    Height:  5\' 10"  (1.778 m)       Vitals:   12/01/19 0126 12/01/19 0128 12/01/19 0549 12/01/19 0700  BP: 116/83  120/69 (!) 120/50  Pulse: (!) 110     Resp: (!) 23  12 11   Temp: 99.1 F (37.3 C)     TempSrc: Oral     SpO2: 99%     Weight:  49.9 kg    Height:  5\' 10"  (1.778 m)      Constitutional: NAD, alert and oriented x 3 Eyes: PERRL, lids and conjunctivae normal ENMT: Mucous membranes are moist.  Neck: normal, supple, no masses, no thyromegaly Respiratory: clear to auscultation bilaterally, no wheezing, no crackles. Normal respiratory effort. No accessory muscle use.  Cardiovascular: Tachycardic , no murmurs / rubs / gallops. No extremity edema. 2+ pedal pulses. No carotid bruits.  Abdomen: tenderness periumbilical, no masses palpated. No hepatosplenomegaly. Bowel sounds positive.  Musculoskeletal: no clubbing / cyanosis. No joint deformity upper and lower extremities.  Skin: no rashes, lesions, ulcers.  Neurologic: No gross focal neurologic deficit. Psychiatric: Normal mood and affect.   Labs on Admission: I have personally reviewed following labs and imaging studies  CBC: Recent Labs  Lab 12/01/19 0132  WBC 15.4*  HGB 12.6*  HCT 42.5  MCV 68.9*  PLT 547*   Basic Metabolic Panel: Recent Labs  Lab 12/01/19 0132  NA 137  K 3.6  CL 100  CO2 15*  GLUCOSE 113*  BUN 16  CREATININE 0.61  CALCIUM 9.0   GFR: Estimated Creatinine Clearance: 83.2 mL/min (by C-G formula based on SCr of 0.61 mg/dL). Liver Function Tests: Recent Labs  Lab 12/01/19 0132  AST 13*  ALT 11  ALKPHOS 84  BILITOT 1.5*  PROT 8.6*  ALBUMIN 4.1   Recent Labs  Lab 12/01/19 0132  LIPASE 20   No results for  input(s): AMMONIA in the last 168 hours. Coagulation Profile: No results for input(s): INR, PROTIME in the last 168 hours. Cardiac Enzymes: No results for input(s): CKTOTAL, CKMB, CKMBINDEX, TROPONINI in the last 168 hours. BNP (last 3 results) No results for input(s): PROBNP in the last 8760 hours. HbA1C: No results for input(s): HGBA1C in the last 72 hours. CBG: No results for input(s): GLUCAP in the last 168 hours. Lipid Profile: No results for input(s): CHOL, HDL, LDLCALC, TRIG, CHOLHDL, LDLDIRECT in the last 72 hours. Thyroid Function Tests: No results for input(s): TSH, T4TOTAL, FREET4, T3FREE, THYROIDAB in the last 72 hours. Anemia Panel: No results for input(s): VITAMINB12, FOLATE, FERRITIN, TIBC, IRON, RETICCTPCT in the last 72 hours. Urine analysis:    Component  Value Date/Time   COLORURINE YELLOW (A) 12/01/2019 0540   APPEARANCEUR CLOUDY (A) 12/01/2019 0540   APPEARANCEUR Turbid 12/24/2013 0313   LABSPEC 1.033 (H) 12/01/2019 0540   LABSPEC 1.021 12/24/2013 0313   PHURINE 6.0 12/01/2019 0540   GLUCOSEU NEGATIVE 12/01/2019 0540   GLUCOSEU Negative 12/24/2013 0313   HGBUR MODERATE (A) 12/01/2019 0540   BILIRUBINUR NEGATIVE 12/01/2019 0540   BILIRUBINUR Negative 12/24/2013 0313   KETONESUR 80 (A) 12/01/2019 0540   PROTEINUR >=300 (A) 12/01/2019 0540   NITRITE NEGATIVE 12/01/2019 0540   LEUKOCYTESUR LARGE (A) 12/01/2019 0540   LEUKOCYTESUR 1+ 12/24/2013 0313    Radiological Exams on Admission: CT ABDOMEN PELVIS W CONTRAST  Result Date: 12/01/2019 CLINICAL DATA:  Nausea and vomiting for 3 days EXAM: CT ABDOMEN AND PELVIS WITH CONTRAST TECHNIQUE: Multidetector CT imaging of the abdomen and pelvis was performed using the standard protocol following bolus administration of intravenous contrast. CONTRAST:  54mL OMNIPAQUE IOHEXOL 300 MG/ML  SOLN COMPARISON:  10/27/2019 FINDINGS: Lower chest:  No contributory findings. Hepatobiliary: Small right upper hepatic cystic density. No  acute or interval finding.No evidence of biliary obstruction or stone. Pancreas: Unremarkable. Spleen: Unremarkable. Adrenals/Urinary Tract: Negative adrenals. No hydronephrosis or stone. Small renal cystic densities, more numerous on the left. Chronic bladder wall thickening and indwelling catheter. Stomach/Bowel: No obstruction. No visible bowel inflammation, including appendicitis Vascular/Lymphatic: Premature atherosclerotic calcifications of the aorta and iliacs. No mass or adenopathy. Reproductive:No acute finding Other: No ascites or pneumoperitoneum. Musculoskeletal: Paraplegia with muscular atrophy and disuse osteopenia of the visualized pelvis and lower limbs. There is chronic sacral and ischial decubitus ulcers with extension to bone and ischial/acral erosions. The right-sided ischial ulcer chronically tunnels to the posterior urethra with gas circling the urinary catheter. No abscess. No acute osseous finding. Prior spinal fusion. IMPRESSION: Negative for bowel obstruction or visible inflammation. Sequela of paralysis including chronic decubitus ulcers. The right ischial ulcer is again seen to extend to the posterior urethra. Electronically Signed   By: Marnee Spring M.D.   On: 12/01/2019 05:06    EKG: Independently reviewed.  Sinus Tachycardia  Assessment/Plan Principal Problem:   UTI (urinary tract infection) due to urinary indwelling catheter (HCC) Active Problems:   Paraplegia (HCC)   Neurogenic bladder   Decubitus ulcer of sacral region   Protein-calorie malnutrition, severe     Paraplegiaand neurogenic bladder: Patient with pyuria on admission with a WBC of 15K He has a chronic indwelling Foley catheter in place that was last changes 3.5 weeks ago Continue empiric antibiotic therapy with Rocephin until urine culture results become available    Sacraldecubitus ulcer, stage IV:  Continue local wound care Will place wound care consult for recommendations     Iron  deficiency anemia:  Continue iron supplementation   Moderate Malnutrition Etiology: chronic illness (chronic non-healing wounds, paraplegia) Signs/Symptoms: mild fat depletion, mild muscle depletion, moderate muscle depletion, moderate fat depletion Interventions: Ensure Enlive (each supplement provides 350kcal and 20 grams of protein), MVI       DVT prophylaxis: Lovenox Code Status: Full code Family Communication: Greater than 50% of time was spent discussing plan of care with patient at the bedside. All questions and concerns have been addressed Disposition Plan: Back to previous home environment Consults called: Case Management    Zuleima Haser MD [facility]     12/01/2019, 7:40 AM

## 2019-12-01 NOTE — Progress Notes (Signed)
PHARMACIST - PHYSICIAN COMMUNICATION  CONCERNING:  Enoxaparin (Lovenox) for DVT Prophylaxis    RECOMMENDATION: Patient was prescribed enoxaprin 40mg  q24 hours for VTE prophylaxis.   Filed Weights   12/01/19 0128  Weight: 49.9 kg (110 lb)    Body mass index is 15.78 kg/m.  Estimated Creatinine Clearance: 83.2 mL/min (by C-G formula based on SCr of 0.61 mg/dL).   Patient is candidate for enoxaparin 30mg  every 24 hours based Weight less  <57kg for men   DESCRIPTION: Pharmacy has adjusted enoxaparin dose per [facility] policy.   Patient is now receiving enoxaparin 30mg  every 24 hours.  Kathie Posa, PharmD Clinical Pharmacist  12/01/2019 8:34 AM

## 2019-12-01 NOTE — ED Provider Notes (Signed)
[facility] Emergency Department Provider Note ________   First MD Initiated Contact with Patient 12/01/19 0134     (approximate)  I have reviewed the triage vital signs and the nursing notes.   HISTORY  Chief Complaint Nausea, Emesis, and Abdominal Pain    HPI Jonathon York is a 44 y.o. male with below list of previous medical conditions including paraplegia chronic osteomyelitis, UTI recent hospital admission secondary to the same returns to the emergency department secondary to 3-day history of nausea vomiting and abdominal discomfort.  Patient states that he ate at cookout and subsequently started experiencing the symptoms beforementioned.  Patient denies any diarrhea.  Patient denies any fever afebrile on presentation.        Past Medical History:  Diagnosis Date  . Anxiety   . Decubitus ulcer   . Depression   . Neurogenic bladder   . Osteomyelitis (HCC)   . 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 Infected decubitus ulcer 10/27/2019  . Chronic indwelling Foley catheter 10/27/2019  . Bilateral leg edema 10/27/2019  . 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 Constipation   . Iron deficiency anemia   . Sacral decubitus ulcer, stage IV (HCC) 07/13/2019  . Intractable pain 11/26/2018  . Anxiety 04/22/2018  . Acute osteomyelitis of right foot (HCC) 03/18/2018  . Pressure injury of skin 03/18/2018  . 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 Protein-calorie malnutrition, severe 11/20/2016  . Herpes simplex infection of penis   . Decubitus ulcer 11/19/2016  . 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 Decubitus ulcer of sacral region   . Sepsis (HCC) 04/15/2016  . 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 Pressure ulcer stage III 04/30/2015  . Sterile pyuria 04/30/2015  . Paraplegia (HCC) 04/30/2015  . Neurogenic  bladder 04/30/2015  . 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 Moderate malnutrition (HCC) 04/28/2015    Past Surgical History:  Procedure Laterality Date  . AMPUTATION Right 03/19/2018   Procedure: 5th Metatarsal Resection;  Surgeon: Recardo Evangelist, DPM;  Location: [facility] ORS;  Service: Podiatry;  Laterality: Right;  . 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 IRRIGATION AND DEBRIDEMENT BUTTOCKS    . TOE AMPUTATION Right     Prior to Admission medications   Medication Sig Start Date End Date Taking? Authorizing Provider  ascorbic acid (VITAMIN C) 500 MG tablet Take 1 tablet (500 mg total) by mouth 2 (two) times daily. 10/28/19   Enedina Finner, MD  feeding supplement, ENSURE ENLIVE, (ENSURE ENLIVE) LIQD Take 237 mLs by mouth 3 (three) times daily between meals. 10/28/19   Enedina Finner, MD  ferrous sulfate 325 (65 FE) MG tablet Take 1 tablet (325 mg total) by mouth daily with breakfast. 10/29/19   Enedina Finner, MD  HYDROcodone-acetaminophen (NORCO/VICODIN) 5-325 MG tablet Take 1 tablet by mouth every 6 (six) hours as needed for moderate pain. 10/28/19   Enedina Finner, MD  ibuprofen (ADVIL) 200 MG tablet Take by mouth.    [provider]  sodium hypochlorite (DAKIN'S 1/4 STRENGTH) 0.125 % SOLN Irrigate with 1 application as directed every morning. 10/29/19   Enedina Finner, MD    Allergies Orange fruit [citrus]  Family History  Problem Relation Age of Onset  . Lymphoma  Sister     Social History Social History   Tobacco Use  . Smoking status: Former Smoker    Packs/day: 0.50    Types: Cigarettes  . Smokeless tobacco: Never Used  Substance Use Topics  . Alcohol use: No  . Drug use: Yes    Types: Marijuana    Comment: 2 days ago    Review of Systems Constitutional: No fever/chills Eyes: No visual changes. ENT: No sore throat. Cardiovascular: Denies chest  pain. Respiratory: Denies shortness of breath. Gastrointestinal: Positive for abdominal pain nausea vomiting and diarrhea Genitourinary: Negative for dysuria. Musculoskeletal: Negative for neck pain.  Negative for back pain. Integumentary: Negative for rash. Neurological: Negative for headaches, focal weakness or numbness.   ____________________________________________   PHYSICAL EXAM:  VITAL SIGNS: ED Triage Vitals  Enc Vitals Group     BP 12/01/19 0126 116/83     Pulse Rate 12/01/19 0126 (!) 110     Resp 12/01/19 0126 (!) 23     Temp 12/01/19 0126 99.1 F (37.3 C)     Temp Source 12/01/19 0126 Oral     SpO2 12/01/19 0119 99 %     Weight 12/01/19 0128 49.9 kg (110 lb)     Height 12/01/19 0128 1.778 m (5\' 10" )     Head Circumference --      Peak Flow --      Pain Score 12/01/19 0127 10     Pain Loc --      Pain Edu? --      Excl. in GC? --     Constitutional: Alert and oriented.  Eyes: Conjunctivae are normal.  Head: Atraumatic. Mouth/Throat: Patient is wearing a mask. Neck: No stridor.  No meningeal signs.   Cardiovascular: Normal rate, regular rhythm. Good peripheral circulation. Grossly normal heart sounds. Respiratory: Normal respiratory effort.  No retractions. Gastrointestinal: Generalized tenderness to palpation no distention.  Musculoskeletal: No lower extremity tenderness nor edema. No gross deformities of extremities. Neurologic:  Normal speech and language. No gross focal neurologic deficits are appreciated.  Skin: 1 large sacral decubitus ulcer and 2 ischial tuberosity decubitus ulcer Psychiatric: Mood and affect are normal. Speech and behavior are normal.  ____________________________________________   LABS (all labs ordered are listed, but only abnormal results are displayed)  Labs Reviewed  COMPREHENSIVE METABOLIC PANEL - Abnormal; Notable for the following components:      Result Value   CO2 15 (*)    Glucose, Bld 113 (*)    Total Protein 8.6  (*)    AST 13 (*)    Total Bilirubin 1.5 (*)    Anion gap 22 (*)    All other components within normal limits  CBC - Abnormal; Notable for the following components:   WBC 15.4 (*)    RBC 6.17 (*)    Hemoglobin 12.6 (*)    MCV 68.9 (*)    MCH 20.4 (*)    MCHC 29.6 (*)    RDW 21.2 (*)    Platelets 547 (*)    All other components within normal limits  URINALYSIS, COMPLETE (UACMP) WITH MICROSCOPIC - Abnormal; Notable for the following components:   Color, Urine YELLOW (*)    APPearance CLOUDY (*)    Specific Gravity, Urine 1.033 (*)    Hgb urine dipstick MODERATE (*)    Ketones, ur 80 (*)    Protein, ur >=300 (*)    Leukocytes,Ua LARGE (*)    RBC / HPF >50 (*)    WBC, UA >50 (*)  Bacteria, UA RARE (*)    All other components within normal limits  LIPASE, BLOOD     RADIOLOGY I,  N Livi Mcgann, personally viewed and evaluated these images (plain radiographs) as part of my medical decision making, as well as reviewing the written report by the radiologist.  ED MD interpretation: Negative for bowel obstruction or any visible inflammation sequelae of paralysis including chronic decubitus ulcers the right ischial ulcer is again seen to extend to the posterior urethra.  Official radiology report(s): CT ABDOMEN PELVIS W CONTRAST  Result Date: 12/01/2019 CLINICAL DATA:  Nausea and vomiting for 3 days EXAM: CT ABDOMEN AND PELVIS WITH CONTRAST TECHNIQUE: Multidetector CT imaging of the abdomen and pelvis was performed using the standard protocol following bolus administration of intravenous contrast. CONTRAST:  65mL OMNIPAQUE IOHEXOL 300 MG/ML  SOLN COMPARISON:  10/27/2019 FINDINGS: Lower chest:  No contributory findings. Hepatobiliary: Small right upper hepatic cystic density. No acute or interval finding.No evidence of biliary obstruction or stone. Pancreas: Unremarkable. Spleen: Unremarkable. Adrenals/Urinary Tract: Negative adrenals. No hydronephrosis or stone. Small renal cystic  densities, more numerous on the left. Chronic bladder wall thickening and indwelling catheter. Stomach/Bowel: No obstruction. No visible bowel inflammation, including appendicitis Vascular/Lymphatic: Premature atherosclerotic calcifications of the aorta and iliacs. No mass or adenopathy. Reproductive:No acute finding Other: No ascites or pneumoperitoneum. Musculoskeletal: Paraplegia with muscular atrophy and disuse osteopenia of the visualized pelvis and lower limbs. There is chronic sacral and ischial decubitus ulcers with extension to bone and ischial/acral erosions. The right-sided ischial ulcer chronically tunnels to the posterior urethra with gas circling the urinary catheter. No abscess. No acute osseous finding. Prior spinal fusion. IMPRESSION: Negative for bowel obstruction or visible inflammation. Sequela of paralysis including chronic decubitus ulcers. The right ischial ulcer is again seen to extend to the posterior urethra. Electronically Signed   By: Marnee Spring M.D.   On: 12/01/2019 05:06    ____________________________________________    Procedures   ____________________________________________   INITIAL IMPRESSION / MDM / ASSESSMENT AND PLAN / ED COURSE  As part of my medical decision making, I reviewed the following data within the electronic MEDICAL RECORD NUMBER  44 year old male presented with above-stated history and physical exam a differential diagnosis including but not limited to gastroenteritis, appendicitis diverticulitis.  CT scan findings consistent with previous scan without any acute findings.  Patient is requesting to be placed in a skilled nursing facility as he states that he is no longer to perform his ADLs and care for himself.  As such social work consult placed. ____________________________________________  FINAL CLINICAL IMPRESSION(S) / ED DIAGNOSES  Final diagnoses:  Nausea and vomiting, intractability of vomiting not specified, unspecified vomiting type   Decubitus ulcer of ischial area, stage 4, unspecified laterality (HCC)     MEDICATIONS GIVEN DURING THIS VISIT:  Medications  morphine 2 MG/ML injection 2 mg (2 mg Intravenous Given 12/01/19 0251)  ondansetron (ZOFRAN) injection 4 mg (4 mg Intravenous Given 12/01/19 0251)  sodium chloride 0.9 % bolus 1,000 mL (0 mLs Intravenous Paused 12/01/19 0337)  iohexol (OMNIPAQUE) 300 MG/ML solution 75 mL (75 mLs Intravenous Contrast Given 12/01/19 0446)  morphine 4 MG/ML injection 4 mg (4 mg Intravenous Given 12/01/19 0437)     ED Discharge Orders    None      *Please note:  Jonathon York was evaluated in Emergency Department on 12/01/2019 for the symptoms described in the history of present illness. He was evaluated in the context of the global COVID-19 pandemic, which  necessitated consideration that the patient might be at risk for infection with the SARS-CoV-2 virus that causes COVID-19. Institutional protocols and algorithms that pertain to the evaluation of patients at risk for COVID-19 are in a state of rapid change based on information released by regulatory bodies including the CDC and federal and state organizations. These policies and algorithms were followed during the patient's care in the ED.  Some ED evaluations and interventions may be delayed as a result of limited staffing during and after the pandemic.*  Note:  This document was prepared using Dragon voice recognition software and may include unintentional dictation errors.   Darci CurrentBrown, [place] N, MD 12/01/19 [phone]

## 2019-12-01 NOTE — ED Notes (Signed)
Pt expresses concerns regarding placement after discharge. Pt expresses desire for SNF placement following rehab

## 2019-12-01 NOTE — Consult Note (Signed)
WOC Nurse wound consult note Consultation was completed by review of records, images and assistance from the bedside nurse/clinical staff.    Reason for Consult: sacral and bilateral ischial ulcerations Patient well known to [facility] nursing service with consultations reviewed as far back as 2016; patient chronic paraplegic. Sits up in chair on pressure injuries Results of CT noted below  CT of abdomen 12/01/19: Musculoskeletal: Paraplegia with muscular atrophy and disuse osteopenia of the visualized pelvis and lower limbs. There is chronic sacral and ischial decubitus ulcers with extension to bone and ischial/acral erosions. The right-sided ischial ulcer chronically tunnels to the posterior urethra with gas circling the urinary catheter. No abscess. No acute osseous finding. Prior spinal fusion.Sequela of paralysis including chronic decubitus ulcers. The right ischial ulcer is again seen to extend to the posterior urethra. Images reviewed from 11/19, 7/20, 3/21, 10/27/19 Wound type: Chronic non healing Stage 4 Pressure Injuries of the bilateral ischial tuberosities and sacrum  Pressure Injury POA: Yes Measurement: requested to be measured when 2 nursing skin assessment completed  Wound bed: Reviewed all images and ED MD notes; chronic clean, non granular wounds with severe epibole of the wound edges  Drainage (amount, consistency, odor) see nursing flow sheets: pending  Periwound: intact Dressing procedure/placement/frequency: Low air loss mattress for pressure redistribution and moisture management Conservative topical care based on drainage amounts and odor; may need Dakin's for a few days to address odor and drainage. Then change back to saline moist gauze. Chronicity of these wounds needs to be addressed in wound care center or preferably by plastic surgeon.   Current wound care can be completed BID per bedside nursing staff.    Re consult if needed, will not follow at this time. Thanks   Alyanah Elliott [employer], RN,CWOCN, CNS, CWON-AP [phone])

## 2019-12-01 NOTE — ED Notes (Signed)
Pharmacy called regarding suspected infiltrate. Told to apply cold compress and periodically reassess

## 2019-12-01 NOTE — ED Notes (Signed)
ED TO INPATIENT HANDOFF REPORT  ED Nurse Name and Phone #: [phone]  S Name/Age/Gender Jonathon York 44 y.o. male Room/Bed: ED07A/ED07A  Code Status   Code Status: Prior  Home/SNF/Other Home Patient oriented to: self, place, time and situation Is this baseline? Yes   Triage Complete: Triage complete  Chief Complaint UTI (urinary tract infection) due to urinary indwelling catheter (HCC) [Z61.096E[T83.511A, N39.0]  Triage Note Pt presents to ED from home via [facility] for c/o abd pain & N/V sx. Pt st that these sx started 3days ago after eating cookout. Pt denies any OTC tx/ relief of sx./ Pt A&Ox4./  Pt was given 4mg  of Zofran in route around 01:18am. 18g IV placed in LT AC. 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     Allergies Allergies  Allergen Reactions  . Orange Fruit [Citrus] Other (See Comments)    Blisters only from ORANGES!!! Patient is not allergic from all citrus    Level of Care/Admitting Diagnosis ED Disposition    ED Disposition Condition Comment   Admit  [facility]ospital [facility] [[id number]]  Level of Care: Med-Surg [16]  Covid Evaluation: Asymptomatic Screening Protocol (No Symptoms)  Diagnosis: UTI (urinary tract infection) due to urinary indwelling catheter [facility](HCC) [[id number]][[id number]]  Admitting Physician: Lonia MadAGBATA, TOCHUKWU [[id number]]  Attending Physician: Lonia MadAGBATA, TOCHUKWU [[id number]]       B Medical/Surgery History Past Medical History:  Diagnosis Date  . Anxiety   . Decubitus ulcer   . Depression   . Neurogenic bladder   . Osteomyelitis (HCC)   . 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 AMPUTATION Right 03/19/2018   Procedure: 5th Metatarsal Resection;  Surgeon: Recardo Evangelistroxler, Matthew, DPM;  Location: [facility] ORS;  Service: Podiatry;  Laterality: Right;  . AMPUTATION TOE Right 04/29/2015   Procedure: AMPUTATION TOE;  Surgeon: Linus Galasodd Cline, MD;  Location: [facility] ORS;   Service: Podiatry;  Laterality: Right;  . IRRIGATION AND DEBRIDEMENT BUTTOCKS    . TOE AMPUTATION Right      A IV Location/Drains/Wounds Patient Lines/Drains/Airways Status    Active Line/Drains/Airways    Name Placement date Placement time Site Days   Peripheral IV 12/01/19 Left;Upper Arm 12/01/19  0426  Arm  less than 1   Urethral Catheter Nikki Solomon 14 Fr. 07/13/19  2230  --  141   Incision (Closed) 03/19/18 Foot Right 03/19/18  1458   622   Pressure Injury 02/14/17 Buttocks Left Stage 4 - Full thickness tissue loss with exposed bone, tendon or muscle. 02/14/17  2342   1020   Pressure Injury 02/14/17 Buttocks Right Stage 4 - Full thickness tissue loss with exposed bone, tendon or muscle. 02/14/17  2342   1020   Pressure Injury Stage III -  Full thickness tissue loss. Subcutaneous fat may be visible but bone, tendon or muscle are NOT exposed. Pink --  --      Pressure Injury 04/23/18 Coccyx Mid Stage III -  Full thickness tissue loss. Subcutaneous fat may be visible but bone, tendon or muscle are NOT exposed. Open but appear to be healing well  04/23/18  0113   587   Pressure Injury 04/23/18 Stage III -  Full thickness tissue loss. Subcutaneous fat may be visible but bone, tendon or muscle are NOT exposed. 04/23/18  0116   587   Pressure Injury 04/23/18 Stage IV - Full thickness tissue loss with exposed bone, tendon or  muscle. 04/23/18  0116   587   Pressure Injury 10/27/19 Sacrum Stage 4 - Full thickness tissue loss with exposed bone, tendon or muscle. 10/27/19  --   35   Pressure Injury 10/27/19 Ischial tuberosity Left Stage 4 - Full thickness tissue loss with exposed bone, tendon or muscle. 10/27/19  --   35   Pressure Injury 10/27/19 Ischial tuberosity Right Stage 4 - Full thickness tissue loss with exposed bone, tendon or muscle. 10/27/19  --   35          Intake/Output Last 24 hours No intake or output data in the 24 hours ending 12/01/19 3212  Labs/Imaging Results for  orders placed or performed during the hospital encounter of 12/01/19 (from the past 48 hour(s))  Lipase, blood     Status: None   Collection Time: 12/01/19  1:32 AM  Result Value Ref Range   Lipase 20 11 - 51 U/L    Comment: Performed at [facility], [address] [place], [place] [address]  Comprehensive metabolic panel     Status: Abnormal   Collection Time: 12/01/19  1:32 AM  Result Value Ref Range   Sodium 137 135 - 145 mmol/L    Comment: LYTES REPEATED SKL   Potassium 3.6 3.5 - 5.1 mmol/L   Chloride 100 98 - 111 mmol/L   CO2 15 (L) 22 - 32 mmol/L   Glucose, Bld 113 (H) 70 - 99 mg/dL    Comment: Glucose reference range applies only to samples taken after fasting for at least 8 hours.   BUN 16 6 - 20 mg/dL   Creatinine, Ser 0.03 0.61 - 1.24 mg/dL   Calcium 9.0 8.9 - 70.4 mg/dL   Total Protein 8.6 (H) 6.5 - 8.1 g/dL   Albumin 4.1 3.5 - 5.0 g/dL   AST 13 (L) 15 - 41 U/L   ALT 11 0 - 44 U/L   Alkaline Phosphatase 84 38 - 126 U/L   Total Bilirubin 1.5 (H) 0.3 - 1.2 mg/dL   GFR calc non Af Amer >60 >60 mL/min   GFR calc Af Amer >60 >60 mL/min   Anion gap 22 (H) 5 - 15    Comment: Performed at [facility], [address] [place], [place] [address]  CBC     Status: Abnormal   Collection Time: 12/01/19  1:32 AM  Result Value Ref Range   WBC 15.4 (H) 4.0 - 10.5 K/uL   RBC 6.17 (H) 4.22 - 5.81 MIL/uL   Hemoglobin 12.6 (L) 13.0 - 17.0 g/dL   HCT 69.4 39 - 52 %   MCV 68.9 (L) 80.0 - 100.0 fL   MCH 20.4 (L) 26.0 - 34.0 pg   MCHC 29.6 (L) 30.0 - 36.0 g/dL   RDW 50.3 (H) 88.8 - 28.0 %   Platelets 547 (H) 150 - 400 K/uL   nRBC 0.0 0.0 - 0.2 %    Comment: Performed at [facility], [address] [place], [place] [address]  Urinalysis, Complete w Microscopic     Status: Abnormal   Collection Time: 12/01/19  5:40 AM  Result Value Ref Range   Color, Urine YELLOW (A) YELLOW   APPearance CLOUDY (A) CLEAR   Specific Gravity, Urine 1.033 (H) 1.005 -  1.030   pH 6.0 5.0 - 8.0   Glucose, UA NEGATIVE NEGATIVE mg/dL   Hgb urine dipstick MODERATE (A) NEGATIVE   Bilirubin Urine NEGATIVE NEGATIVE   Ketones, ur 80 (A) NEGATIVE  mg/dL   Protein, ur >=630 (A) NEGATIVE mg/dL   Nitrite NEGATIVE NEGATIVE   Leukocytes,Ua LARGE (A) NEGATIVE   RBC / HPF >50 (H) 0 - 5 RBC/hpf   WBC, UA >50 (H) 0 - 5 WBC/hpf   Bacteria, UA RARE (A) NONE SEEN   Squamous Epithelial / LPF 0-5 0 - 5   WBC Clumps PRESENT    Mucus PRESENT     Comment: Performed at [facility], [address] [place], [place] [address]   CT ABDOMEN PELVIS W CONTRAST  Result Date: 12/01/2019 CLINICAL DATA:  Nausea and vomiting for 3 days EXAM: CT ABDOMEN AND PELVIS WITH CONTRAST TECHNIQUE: Multidetector CT imaging of the abdomen and pelvis was performed using the standard protocol following bolus administration of intravenous contrast. CONTRAST:  39mL OMNIPAQUE IOHEXOL 300 MG/ML  SOLN COMPARISON:  10/27/2019 FINDINGS: Lower chest:  No contributory findings. Hepatobiliary: Small right upper hepatic cystic density. No acute or interval finding.No evidence of biliary obstruction or stone. Pancreas: Unremarkable. Spleen: Unremarkable. Adrenals/Urinary Tract: Negative adrenals. No hydronephrosis or stone. Small renal cystic densities, more numerous on the left. Chronic bladder wall thickening and indwelling catheter. Stomach/Bowel: No obstruction. No visible bowel inflammation, including appendicitis Vascular/Lymphatic: Premature atherosclerotic calcifications of the aorta and iliacs. No mass or adenopathy. Reproductive:No acute finding Other: No ascites or pneumoperitoneum. Musculoskeletal: Paraplegia with muscular atrophy and disuse osteopenia of the visualized pelvis and lower limbs. There is chronic sacral and ischial decubitus ulcers with extension to bone and ischial/acral erosions. The right-sided ischial ulcer chronically tunnels to the posterior urethra with gas circling the urinary  catheter. No abscess. No acute osseous finding. Prior spinal fusion. IMPRESSION: Negative for bowel obstruction or visible inflammation. Sequela of paralysis including chronic decubitus ulcers. The right ischial ulcer is again seen to extend to the posterior urethra. Electronically Signed   By: Marnee Spring M.D.   On: 12/01/2019 05:06    Pending Labs Unresulted Labs (From admission, onward) Comment          Start     Ordered   12/01/19 0801  SARS Coronavirus 2 by RT PCR (hospital order, performed in [facility] hospital lab) Nasopharyngeal Nasopharyngeal Swab  (Tier 2 (TAT 2 hrs))  Once,   STAT       Question Answer Comment  Is this test for diagnosis or screening Screening   Symptomatic for COVID-19 as defined by CDC No   Hospitalized for COVID-19 No   Admitted to ICU for COVID-19 No   Previously tested for COVID-19 Yes   Resident in a congregate (group) care setting No   Employed in healthcare setting No   Has patient completed COVID vaccination(s) (2 doses of Pfizer/Moderna 1 dose of [employer]) Unknown      12/01/19 0800   12/01/19 0710  Lactic acid, plasma  Now then every 2 hours,   STAT      12/01/19 0709          Vitals/Pain Today's Vitals   12/01/19 0700 12/01/19 0703 12/01/19 0801 12/01/19 0807  BP: (!) 120/50  96/61   Pulse:      Resp: 11  15   Temp:      TempSrc:      SpO2:      Weight:      Height:      PainSc:  6   4     Isolation Precautions No active isolations  Medications Medications  HYDROcodone-acetaminophen (NORCO/VICODIN) 5-325 MG per tablet 1 tablet (  has no administration in time range)  ferrous sulfate tablet 325 mg (has no administration in time range)  sodium hypochlorite (DAKIN'S 1/4 STRENGTH) topical solution 1 application (has no administration in time range)  ascorbic acid (VITAMIN C) tablet 500 mg (has no administration in time range)  feeding supplement (ENSURE ENLIVE) (ENSURE ENLIVE) liquid 237 mL (has no administration in  time range)  morphine 2 MG/ML injection 2 mg (2 mg Intravenous Given 12/01/19 0251)  ondansetron (ZOFRAN) injection 4 mg (4 mg Intravenous Given 12/01/19 0251)  sodium chloride 0.9 % bolus 1,000 mL (0 mLs Intravenous Stopped 12/01/19 0705)  iohexol (OMNIPAQUE) 300 MG/ML solution 75 mL (75 mLs Intravenous Contrast Given 12/01/19 0446)  morphine 4 MG/ML injection 4 mg (4 mg Intravenous Given 12/01/19 0437)  cefTRIAXone (ROCEPHIN) 1 g in sodium chloride 0.9 % 100 mL IVPB (0 g Intravenous Stopped 12/01/19 0808)  oxyCODONE-acetaminophen (PERCOCET/ROXICET) 5-325 MG per tablet 1 tablet (1 tablet Oral Given 12/01/19 0717)    Mobility non-ambulatory Low fall risk   Focused Assessments    R Recommendations: See Admitting Provider Note  Report given to:

## 2019-12-02 DIAGNOSIS — Z681 Body mass index (BMI) 19 or less, adult: Secondary | ICD-10-CM | POA: Diagnosis not present

## 2019-12-02 DIAGNOSIS — L89154 Pressure ulcer of sacral region, stage 4: Secondary | ICD-10-CM | POA: Diagnosis present

## 2019-12-02 DIAGNOSIS — E876 Hypokalemia: Secondary | ICD-10-CM | POA: Diagnosis present

## 2019-12-02 DIAGNOSIS — D72829 Elevated white blood cell count, unspecified: Secondary | ICD-10-CM | POA: Diagnosis present

## 2019-12-02 DIAGNOSIS — M4628 Osteomyelitis of vertebra, sacral and sacrococcygeal region: Secondary | ICD-10-CM | POA: Diagnosis present

## 2019-12-02 DIAGNOSIS — L89314 Pressure ulcer of right buttock, stage 4: Secondary | ICD-10-CM | POA: Diagnosis present

## 2019-12-02 DIAGNOSIS — L89324 Pressure ulcer of left buttock, stage 4: Secondary | ICD-10-CM | POA: Diagnosis present

## 2019-12-02 DIAGNOSIS — Z91018 Allergy to other foods: Secondary | ICD-10-CM | POA: Diagnosis not present

## 2019-12-02 DIAGNOSIS — Z87891 Personal history of nicotine dependence: Secondary | ICD-10-CM | POA: Diagnosis not present

## 2019-12-02 DIAGNOSIS — N390 Urinary tract infection, site not specified: Secondary | ICD-10-CM | POA: Diagnosis present

## 2019-12-02 DIAGNOSIS — T83511S Infection and inflammatory reaction due to indwelling urethral catheter, sequela: Secondary | ICD-10-CM | POA: Diagnosis not present

## 2019-12-02 DIAGNOSIS — Z79899 Other long term (current) drug therapy: Secondary | ICD-10-CM | POA: Diagnosis not present

## 2019-12-02 DIAGNOSIS — Z89421 Acquired absence of other right toe(s): Secondary | ICD-10-CM | POA: Diagnosis not present

## 2019-12-02 DIAGNOSIS — T83518A Infection and inflammatory reaction due to other urinary catheter, initial encounter: Secondary | ICD-10-CM | POA: Diagnosis present

## 2019-12-02 DIAGNOSIS — B952 Enterococcus as the cause of diseases classified elsewhere: Secondary | ICD-10-CM | POA: Diagnosis present

## 2019-12-02 DIAGNOSIS — R000 Tachycardia, unspecified: Secondary | ICD-10-CM | POA: Diagnosis present

## 2019-12-02 DIAGNOSIS — E860 Dehydration: Secondary | ICD-10-CM | POA: Diagnosis present

## 2019-12-02 DIAGNOSIS — Z20822 Contact with and (suspected) exposure to covid-19: Secondary | ICD-10-CM | POA: Diagnosis present

## 2019-12-02 DIAGNOSIS — E440 Moderate protein-calorie malnutrition: Secondary | ICD-10-CM | POA: Diagnosis present

## 2019-12-02 DIAGNOSIS — E43 Unspecified severe protein-calorie malnutrition: Secondary | ICD-10-CM

## 2019-12-02 DIAGNOSIS — Z807 Family history of other malignant neoplasms of lymphoid, hematopoietic and related tissues: Secondary | ICD-10-CM | POA: Diagnosis not present

## 2019-12-02 DIAGNOSIS — D509 Iron deficiency anemia, unspecified: Secondary | ICD-10-CM | POA: Diagnosis present

## 2019-12-02 DIAGNOSIS — G8220 Paraplegia, unspecified: Secondary | ICD-10-CM | POA: Diagnosis present

## 2019-12-02 DIAGNOSIS — Y846 Urinary catheterization as the cause of abnormal reaction of the patient, or of later complication, without mention of misadventure at the time of the procedure: Secondary | ICD-10-CM | POA: Diagnosis present

## 2019-12-02 DIAGNOSIS — N3000 Acute cystitis without hematuria: Secondary | ICD-10-CM | POA: Diagnosis present

## 2019-12-02 DIAGNOSIS — N319 Neuromuscular dysfunction of bladder, unspecified: Secondary | ICD-10-CM | POA: Diagnosis present

## 2019-12-02 LAB — BASIC METABOLIC PANEL
Anion gap: 7 (ref 5–15)
BUN: 7 mg/dL (ref 6–20)
CO2: 24 mmol/L (ref 22–32)
Calcium: 8.6 mg/dL — ABNORMAL LOW (ref 8.9–10.3)
Chloride: 108 mmol/L (ref 98–111)
Creatinine, Ser: 0.38 mg/dL — ABNORMAL LOW (ref 0.61–1.24)
GFR calc Af Amer: >60 mL/min (ref >60)
GFR calc non Af Amer: >60 mL/min (ref >60)
Glucose, Bld: 111 mg/dL — ABNORMAL HIGH (ref 70–99)
Potassium: 3.2 mmol/L — ABNORMAL LOW (ref 3.5–5.1)
Sodium: 139 mmol/L (ref 135–145)

## 2019-12-02 LAB — CBC
HCT: 33.7 % — ABNORMAL LOW (ref 39.0–52.0)
Hemoglobin: 9.8 g/dL — ABNORMAL LOW (ref 13.0–17.0)
MCH: 20.4 pg — ABNORMAL LOW (ref 26.0–34.0)
MCHC: 29.1 g/dL — ABNORMAL LOW (ref 30.0–36.0)
MCV: 70.2 fL — ABNORMAL LOW (ref 80.0–100.0)
Platelets: 336 10*3/uL (ref 150–400)
RBC: 4.8 MIL/uL (ref 4.22–5.81)
RDW: 20.2 % — ABNORMAL HIGH (ref 11.5–15.5)
WBC: 7.4 10*3/uL (ref 4.0–10.5)
nRBC: 0 % (ref 0.0–0.2)

## 2019-12-02 MED ORDER — CHLORHEXIDINE GLUCONATE CLOTH 2 % EX PADS
6.0000 | MEDICATED_PAD | Freq: Every day | CUTANEOUS | Status: DC
  Administered 2019-12-03 – 2019-12-04 (×2): 6 via TOPICAL

## 2019-12-02 MED ORDER — OXYCODONE-ACETAMINOPHEN 5-325 MG PO TABS
1.0000 | ORAL_TABLET | Freq: Four times a day (QID) | ORAL | Status: DC | PRN
  Administered 2019-12-03 – 2019-12-05 (×7): 1 via ORAL
  Filled 2019-12-02 (×7): qty 1

## 2019-12-02 MED ORDER — POTASSIUM CHLORIDE CRYS ER 20 MEQ PO TBCR
40.0000 meq | EXTENDED_RELEASE_TABLET | Freq: Once | ORAL | Status: AC
  Administered 2019-12-02: 40 meq via ORAL
  Filled 2019-12-02: qty 2

## 2019-12-02 MED ORDER — OXYCODONE-ACETAMINOPHEN 5-325 MG PO TABS
1.0000 | ORAL_TABLET | Freq: Four times a day (QID) | ORAL | Status: DC | PRN
  Administered 2019-12-02: 11:00:00 1 via ORAL
  Filled 2019-12-02: qty 1

## 2019-12-02 NOTE — TOC Initial Note (Signed)
Transition of Care [facility]) - Initial/Assessment Note    Patient Details  Name: Jonathon York MRN: [id number] Date of Birth: 09-14-1975  [facility]) CM/SW Contact:    Elease Hashimoto, LCSW Phone Number: 12/02/2019, 12:11 PM  Clinical Narrative:  Met with pt to discuss discharge plan. He feels due to his wounds and nutrition needs he needs to go to a SNF to be able to achieve this. Discussed if he had a preference of facility he wants close to [facility] where his MD's are. Have messaged MD pt is currently on observation status which with his Medicare he is not eligible for SNF, MD would need to make him inpatient status for him to be eligible for coverage in SNF. Otherwise pt's only choice is home health follow up. Will await response from MD regarding his status. Will start Fl2 and bed search in case changed to inpatient status.                 Expected Discharge Plan: Skilled Nursing Facility Barriers to Discharge: Continued Medical Work up, Other (comment) (Pt observation status and has Medicare)   Patient Goals and CMS Choice Patient states their goals for this hospitalization and ongoing recovery are:: I guess I need to go to a rehab where my wounds can heal and I can gain weight CMS Medicare.gov Compare Post Acute Care list provided to:: Patient Choice offered to / list presented to : Patient  Expected Discharge Plan and Services Expected Discharge Plan: [place] In-house Referral: Clinical Social Work   Post Acute Care Choice: [place] Living arrangements for the past 2 months: [place]                                      Prior Living Arrangements/Services Living arrangements for the past 2 months: Single Family Home Lives with:: Self Patient language and need for interpreter reviewed:: No Do you feel safe going back to the place where you live?: Yes      Need for Family Participation in Patient Care: No  (Comment) Care giver support system in place?: No (comment) Current home services: DME (has all needed equipment) Criminal Activity/Legal Involvement Pertinent to Current Situation/Hospitalization: No - Comment as needed  Activities of Daily Living Home Assistive Devices/Equipment: Wheelchair, Grab bars around toilet, Grab bars in shower, Built-in shower seat, Other (Comment) (ramp) ADL Screening (condition at time of admission) Patient's cognitive ability adequate to safely complete daily activities?: Yes Is the patient deaf or have difficulty hearing?: No Does the patient have difficulty seeing, even when wearing glasses/contacts?: No Does the patient have difficulty concentrating, remembering, or making decisions?: No Patient able to express need for assistance with ADLs?: Yes Does the patient have difficulty dressing or bathing?: No Independently performs ADLs?: Yes (appropriate for developmental age) Does the patient have difficulty walking or climbing stairs?: Yes (paraplegic) Weakness of Legs: Both (paraplegic) Weakness of Arms/Hands: None  Permission Sought/Granted Permission sought to share information with : Family Supports, Chartered certified accountant granted to share information with : Yes, Verbal Permission Granted  Share Information with NAME: Mardene Celeste  Permission granted to share info w AGENCY: SNF's  Permission granted to share info w Relationship: sister     Emotional Assessment Appearance:: Appears stated age Attitude/Demeanor/Rapport: Engaged Affect (typically observed): Adaptable, Accepting Orientation: : Oriented to Self, Oriented to Place, Oriented to Situation, Oriented  to  Time   Psych Involvement: No (comment)  Admission diagnosis:  Acute cystitis without hematuria [N30.00] UTI (urinary tract infection) due to urinary indwelling catheter ([place]) [T83.511A, N39.0] Nausea and vomiting, intractability of vomiting not specified, unspecified  vomiting type [R11.2] Decubitus ulcer of ischial area, stage 4, unspecified laterality ([place]) [L89.304] Patient Active Problem List   Diagnosis Date Noted  . UTI (urinary tract infection) due to urinary indwelling catheter ([place]) 12/01/2019  . Infected decubitus ulcer 10/27/2019  . Chronic indwelling Foley catheter 10/27/2019  . Bilateral leg edema 10/27/2019  . 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 Constipation   . Iron deficiency anemia   . Sacral decubitus ulcer, stage IV ([place]) 07/13/2019  . Intractable pain 11/26/2018  . Anxiety 04/22/2018  . Acute osteomyelitis of right foot ([place]) 03/18/2018  . Pressure injury of skin 03/18/2018  . Stage IV pressure ulcer of right buttock ([place])   . Protein-calorie malnutrition, severe 11/20/2016  . Herpes simplex infection of penis   . Decubitus ulcer 11/19/2016  . Severe recurrent major depression without psychotic features ([place]) 04/16/2016  . Decubitus ulcer of sacral region   . Sepsis ([place]) 04/15/2016  . 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 Pressure ulcer stage III 04/30/2015  . Sterile pyuria 04/30/2015  . Paraplegia ([place]) 04/30/2015  . Neurogenic bladder 04/30/2015  . Toe osteomyelitis, right ([place]) 04/28/2015  . Moderate malnutrition ([place]) 04/28/2015   PCP:  Leonel Ramsay, MD Pharmacy:   [facility] [address] [place] [address] Phone: [phone] Fax: [place], [place] [facility] [address] [place] [facility] [place] [address] Phone: [phone] Fax: [place], [place] [address] Phone: [phone] Fax: [phone]     Social Determinants of Health (SDOH) Interventions    Readmission Risk Interventions No flowsheet data found.

## 2019-12-02 NOTE — NC FL2 (Signed)
[place] MEDICAID FL2 LEVEL OF CARE SCREENING TOOL     IDENTIFICATION  Patient Name: Jonathon York Birthdate: [birth year] Sex: male Admission Date (Current Location): 12/01/2019  [place] and [place] Number:  [address] [id number] Facility and Address:  [facility], [address], [place], [place] [address]      Provider Number: [id number]  Attending Physician Name and Address:  Briant Cedar, MD  Relative Name and Phone Number:  Ketan Renz [phone]    Current Level of Care: Hospital Recommended Level of Care: Skilled Nursing Facility Prior Approval Number:    Date Approved/Denied:   PASRR Number: [id number]  Discharge Plan: SNF    Current Diagnoses: Patient Active Problem List   Diagnosis Date Noted  . UTI (urinary tract infection) due to urinary indwelling catheter (HCC) 12/01/2019  . Infected decubitus ulcer 10/27/2019  . Chronic indwelling Foley catheter 10/27/2019  . Bilateral leg edema 10/27/2019  . 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 Constipation   . Iron deficiency anemia   . Sacral decubitus ulcer, stage IV (HCC) 07/13/2019  . Intractable pain 11/26/2018  . Anxiety 04/22/2018  . Acute osteomyelitis of right foot (HCC) 03/18/2018  . Pressure injury of skin 03/18/2018  . 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 Protein-calorie malnutrition, severe 11/20/2016  . Herpes simplex infection of penis   . Decubitus ulcer 11/19/2016  . 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 Decubitus ulcer of sacral region   . Sepsis (HCC) 04/15/2016  . 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 Pressure ulcer stage III 04/30/2015  . Sterile pyuria 04/30/2015  . Paraplegia (HCC) 04/30/2015  . Neurogenic bladder 04/30/2015  . 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 Moderate malnutrition (HCC) 04/28/2015    Orientation RESPIRATION BLADDER Height & Weight      Self, Time, Situation, Place  Normal Indwelling catheter Weight: 110 lb (49.9 kg) Height:  5\' 10"  (177.8 cm)  BEHAVIORAL SYMPTOMS/MOOD NEUROLOGICAL BOWEL NUTRITION STATUS      Continent Diet (regular diet thin liquids)  AMBULATORY STATUS COMMUNICATION OF NEEDS Skin   Independent (papraplegic-can transfer himself in wheelchair) Verbally PU Stage and Appropriate Care       PU Stage 4 Dressing: BID               Personal Care Assistance Level of Assistance  Bathing, Dressing Bathing Assistance: Limited assistance   Dressing Assistance: Limited assistance     Functional Limitations Info             SPECIAL CARE FACTORS FREQUENCY  Bowel and bladder program         Bowel and Bladder Program Frequency: bowel program          Contractures Contractures Info: Not present    Additional Factors Info  Code Status, Allergies Code Status Info: Full Code Allergies Info: Orange fruit           Current Medications (12/02/2019):  This is the current hospital active medication list Current Facility-Administered Medications  Medication Dose Route Frequency Provider Last Rate Last Admin  . 0.9 %  sodium chloride infusion   Intravenous Continuous Agbata, Tochukwu, MD 125 mL/hr at 12/02/19 1038 New Bag at 12/02/19 1038  . acetaminophen (TYLENOL) tablet 650 mg  650 mg Oral Q6H PRN Agbata, Tochukwu, MD       Or  . acetaminophen (TYLENOL) suppository 650 mg  650 mg Rectal Q6H PRN Agbata, Tochukwu, MD      .  ascorbic acid (VITAMIN C) tablet 500 mg  500 mg Oral BID Agbata, Tochukwu, MD   500 mg at 12/02/19 0825  . cefTRIAXone (ROCEPHIN) 1 g in sodium chloride 0.9 % 100 mL IVPB  1 g Intravenous Q24H Agbata, Tochukwu, MD 200 mL/hr at 12/02/19 0825 1 g at 12/02/19 0825  . Chlorhexidine Gluconate Cloth 2 % PADS 6 each  6 each Topical Daily Briant Cedar, MD      . enoxaparin (LOVENOX) injection 30 mg  30 mg Subcutaneous Q24H Agbata, Tochukwu, MD   30 mg at 12/02/19 0826  .  feeding supplement (ENSURE ENLIVE) (ENSURE ENLIVE) liquid 237 mL  237 mL Oral TID BM Agbata, Tochukwu, MD   237 mL at 12/02/19 1043  . ferrous sulfate tablet 325 mg  325 mg Oral Q breakfast Agbata, Tochukwu, MD   325 mg at 12/02/19 0825  . morphine 2 MG/ML injection 2 mg  2 mg Intravenous Q4H PRN Agbata, Tochukwu, MD   2 mg at 12/02/19 0344  . ondansetron (ZOFRAN) tablet 4 mg  4 mg Oral Q6H PRN Agbata, Tochukwu, MD       Or  . ondansetron (ZOFRAN) injection 4 mg  4 mg Intravenous Q6H PRN Agbata, Tochukwu, MD      . oxyCODONE-acetaminophen (PERCOCET/ROXICET) 5-325 MG per tablet 1 tablet  1 tablet Oral Q6H PRN Briant Cedar, MD      . sodium hypochlorite (DAKIN'S 1/4 STRENGTH) topical solution 1 application  1 application Irrigation q morning - 10a Agbata, Tochukwu, MD   1 application at 12/01/19 1008     Discharge Medications: Please see discharge summary for a list of discharge medications.  Relevant Imaging Results:  Relevant Lab Results:   Additional Information SSN: [id number] Pt needs low air loss matteess due to skin wounds  Karlo Goeden, Lemar Livings, LCSW

## 2019-12-02 NOTE — Progress Notes (Signed)
PROGRESS NOTE  ELDRA WORD [id number]:[id number] DOB: Apr 07, 1976 DOA: 12/01/2019 PCP: Mick Sell, MD  HPI/Recap of past 24 hours: HPI from Dr Carmine Savoy is a 44 y.o. male with medical history significant for paraplegia, chronic osteomyelitis, chronic decubitus ulcers, neurogenic bladder with chronic indwelling Foley who presents for evaluation of nausea, vomiting and abdominal discomfort.  Patient states that he ate at a cookout and subsequently started experiencing these symptoms. Labs reveal a white count of 15.4, hemoglobin of 12.6, MCV of 68.9, serum bicarb of 15 with an anion gap of 22. Vital signs show T-max of 99.1, he was tachycardic with heart rate of 110, tachypneic with respiratory rate of 23. Patient also has significant pyuria. CT scan of abdomen and pelvis is negative for bowel obstruction or visible inflammation.  Patient admitted for further management.    Today, patient still reporting abdominal pain, nausea, denies any further vomiting.  Patient requesting nursing home care as he can no longer take care of himself especially with his chronic decubitus ulcers/chronic osteomyelitis.  TOC team notified, patient is to be inpatient for him to qualify for nursing home placement.   Assessment/Plan: Principal Problem:   UTI (urinary tract infection) due to urinary indwelling catheter (HCC) Active Problems:   Paraplegia (HCC)   Neurogenic bladder   Decubitus ulcer of sacral region   Protein-calorie malnutrition, severe   Possible UTI Paraplegia/neurogenic bladder/chronic indwelling Foley catheter Currently afebrile, with resolved leukocytosis UA showed large leukocytes, rare bacteria, greater than 50 WBC, UC pending collection Continue IV ceftriaxone for now Monitor closely  Noted hemoconcentration On admission, WBC 15.4, hemoglobin 12.6, platelets 547 Likely 2/2 dehydration Continue IV fluids  Hypokalemia Replace as needed  Sacral decubitus ulcer,  stage IV History of chronic osteomyelitis Continue current wound care Patient reports that he is unable to care for himself especially with his sacral decubitus ulcer, malnourished with poor wound healing, requesting to be placed in a nursing home for further assistance  Chronic microcytic anemia Hemoglobin currently at baseline Anemia panel pending No evidence of bleeding Continue iron supplementation Daily CBC  Moderate malnutrition Continue supplements        Malnutrition Type:      Malnutrition Characteristics:      Nutrition Interventions:       Estimated body mass index is 15.78 kg/m as calculated from the following:   Height as of this encounter: 5\' 10"  (1.778 m).   Weight as of this encounter: 49.9 kg.     Code Status: Full  Family Communication: Discussed with patient extensively  Disposition Plan: Status is: Inpatient  The patient will require care spanning > 2 midnights and should be moved to inpatient because: Inpatient level of care appropriate due to severity of illness  Dispo: The patient is from: Home              Anticipated d/c is to: SNF              Anticipated d/c date is: 1 day              Patient currently is not medically stable to d/c.    Consultants:  None  Procedures: None  Antimicrobials:  Ceftriaxone  DVT prophylaxis: Lovenox   Objective: Vitals:   12/02/19 0018 12/02/19 0506 12/02/19 0841 12/02/19 1213  BP: 102/60 105/62 108/68 106/62  Pulse: 88 88 81 86  Resp: 16 16 16 18   Temp: 98.6 F (37 C) 98.7 F (37.1 C) 98.2 F (36.8  C) 98 F (36.7 C)  TempSrc: Oral Oral Oral Oral  SpO2: 100% 100% 100% 99%  Weight:      Height:        Intake/Output Summary (Last 24 hours) at 12/02/2019 1344 Last data filed at 12/02/2019 1019 Gross per 24 hour  Intake 120 ml  Output 1115 ml  Net -995 ml   Filed Weights   12/01/19 0128  Weight: 49.9 kg    Exam:  General: NAD   Cardiovascular: S1, S2  present  Respiratory: CTAB  Abdomen: Soft, nontender, nondistended, bowel sounds present  Musculoskeletal: No bilateral pedal edema noted  Skin:  Sacral decubitus ulcer  Psychiatry: Normal mood   Data Reviewed: CBC: Recent Labs  Lab 12/01/19 0132 12/02/19 0530  WBC 15.4* 7.4  HGB 12.6* 9.8*  HCT 42.5 33.7*  MCV 68.9* 70.2*  PLT 547* 336   Basic Metabolic Panel: Recent Labs  Lab 12/01/19 0132 12/02/19 0530  NA 137 139  K 3.6 3.2*  CL 100 108  CO2 15* 24  GLUCOSE 113* 111*  BUN 16 7  CREATININE 0.61 0.38*  CALCIUM 9.0 8.6*   GFR: Estimated Creatinine Clearance: 83.2 mL/min (A) (by C-G formula based on SCr of 0.38 mg/dL (L)). Liver Function Tests: Recent Labs  Lab 12/01/19 0132  AST 13*  ALT 11  ALKPHOS 84  BILITOT 1.5*  PROT 8.6*  ALBUMIN 4.1   Recent Labs  Lab 12/01/19 0132  LIPASE 20   No results for input(s): AMMONIA in the last 168 hours. Coagulation Profile: No results for input(s): INR, PROTIME in the last 168 hours. Cardiac Enzymes: No results for input(s): CKTOTAL, CKMB, CKMBINDEX, TROPONINI in the last 168 hours. BNP (last 3 results) No results for input(s): PROBNP in the last 8760 hours. HbA1C: No results for input(s): HGBA1C in the last 72 hours. CBG: No results for input(s): GLUCAP in the last 168 hours. Lipid Profile: No results for input(s): CHOL, HDL, LDLCALC, TRIG, CHOLHDL, LDLDIRECT in the last 72 hours. Thyroid Function Tests: No results for input(s): TSH, T4TOTAL, FREET4, T3FREE, THYROIDAB in the last 72 hours. Anemia Panel: No results for input(s): VITAMINB12, FOLATE, FERRITIN, TIBC, IRON, RETICCTPCT in the last 72 hours. Urine analysis:    Component Value Date/Time   COLORURINE YELLOW (A) 12/01/2019 0540   APPEARANCEUR CLOUDY (A) 12/01/2019 0540   APPEARANCEUR Turbid 12/24/2013 0313   LABSPEC 1.033 (H) 12/01/2019 0540   LABSPEC 1.021 12/24/2013 0313   PHURINE 6.0 12/01/2019 0540   GLUCOSEU NEGATIVE 12/01/2019 0540    GLUCOSEU Negative 12/24/2013 0313   HGBUR MODERATE (A) 12/01/2019 0540   BILIRUBINUR NEGATIVE 12/01/2019 0540   BILIRUBINUR Negative 12/24/2013 0313   KETONESUR 80 (A) 12/01/2019 0540   PROTEINUR >=300 (A) 12/01/2019 0540   NITRITE NEGATIVE 12/01/2019 0540   LEUKOCYTESUR LARGE (A) 12/01/2019 0540   LEUKOCYTESUR 1+ 12/24/2013 0313   Sepsis Labs: @LABRCNTIP (procalcitonin:4,lacticidven:4)  ) Recent Results (from the past 240 hour(s))  SARS Coronavirus 2 by RT PCR (hospital order, performed in [facility] hospital lab) Nasopharyngeal Nasopharyngeal Swab     Status: None   Collection Time: 12/01/19  8:00 AM   Specimen: Nasopharyngeal Swab  Result Value Ref Range Status   SARS Coronavirus 2 NEGATIVE NEGATIVE Final    Comment: (NOTE) SARS-CoV-2 target nucleic acids are NOT DETECTED.  The SARS-CoV-2 RNA is generally detectable in upper and lower respiratory specimens during the acute phase of infection. The lowest concentration of SARS-CoV-2 viral copies this assay can detect is 250 copies /  mL. A negative result does not preclude SARS-CoV-2 infection and should not be used as the sole basis for treatment or other patient management decisions.  A negative result may occur with improper specimen collection / handling, submission of specimen other than nasopharyngeal swab, presence of viral mutation(s) within the areas targeted by this assay, and inadequate number of viral copies (<250 copies / mL). A negative result must be combined with clinical observations, patient history, and epidemiological information.  Fact Sheet for Patients:   BoilerBrush.com.cy  Fact Sheet for Healthcare Providers: https://pope.com/  This test is not yet approved or  cleared by the [place] FDA and has been authorized for detection and/or diagnosis of SARS-CoV-2 by FDA under an Emergency Use Authorization (EUA).  This EUA will remain in effect  (meaning this test can be used) for the duration of the COVID-19 declaration under Section 564(b)(1) of the Act, 21 U.S.C. section 360bbb-3(b)(1), unless the authorization is terminated or revoked sooner.  Performed at [facility], [address] [place], [place] [address]       Studies: No results found.  Scheduled Meds: . ascorbic acid  500 mg Oral BID  . Chlorhexidine Gluconate Cloth  6 each Topical Daily  . enoxaparin (LOVENOX) injection  30 mg Subcutaneous Q24H  . feeding supplement (ENSURE ENLIVE)  237 mL Oral TID BM  . ferrous sulfate  325 mg Oral Q breakfast  . sodium hypochlorite  1 application Irrigation q morning - 10a    Continuous Infusions: . sodium chloride 125 mL/hr at 12/02/19 1038  . cefTRIAXone (ROCEPHIN)  IV 1 g (12/02/19 0825)     LOS: 0 days     Briant Cedar, MD [facility]  If 7PM-7AM, please contact night-coverage [email or web address] 12/02/2019, 1:44 PM

## 2019-12-03 LAB — CBC WITH DIFFERENTIAL/PLATELET
Abs Immature Granulocytes: 0.02 10*3/uL (ref 0.00–0.07)
Basophils Absolute: 0.1 10*3/uL (ref 0.0–0.1)
Basophils Relative: 1 %
Eosinophils Absolute: 0.2 10*3/uL (ref 0.0–0.5)
Eosinophils Relative: 3 %
HCT: 28.9 % — ABNORMAL LOW (ref 39.0–52.0)
Hemoglobin: 8.9 g/dL — ABNORMAL LOW (ref 13.0–17.0)
Immature Granulocytes: 0 %
Lymphocytes Relative: 32 %
Lymphs Abs: 2.2 10*3/uL (ref 0.7–4.0)
MCH: 20.8 pg — ABNORMAL LOW (ref 26.0–34.0)
MCHC: 30.8 g/dL (ref 30.0–36.0)
MCV: 67.5 fL — ABNORMAL LOW (ref 80.0–100.0)
Monocytes Absolute: 0.5 10*3/uL (ref 0.1–1.0)
Monocytes Relative: 7 %
Neutro Abs: 3.9 10*3/uL (ref 1.7–7.7)
Neutrophils Relative %: 57 %
Platelets: 290 10*3/uL (ref 150–400)
RBC: 4.28 MIL/uL (ref 4.22–5.81)
RDW: 21 % — ABNORMAL HIGH (ref 11.5–15.5)
Smear Review: ADEQUATE
WBC: 6.8 10*3/uL (ref 4.0–10.5)
nRBC: 0 % (ref 0.0–0.2)

## 2019-12-03 LAB — BASIC METABOLIC PANEL
Anion gap: 6 (ref 5–15)
BUN: 5 mg/dL — ABNORMAL LOW (ref 6–20)
CO2: 26 mmol/L (ref 22–32)
Calcium: 8.2 mg/dL — ABNORMAL LOW (ref 8.9–10.3)
Chloride: 110 mmol/L (ref 98–111)
Creatinine, Ser: 0.36 mg/dL — ABNORMAL LOW (ref 0.61–1.24)
GFR calc Af Amer: >60 mL/min (ref >60)
GFR calc non Af Amer: >60 mL/min (ref >60)
Glucose, Bld: 99 mg/dL (ref 70–99)
Potassium: 3.5 mmol/L (ref 3.5–5.1)
Sodium: 142 mmol/L (ref 135–145)

## 2019-12-03 LAB — FOLATE: Folate: 9.9 ng/mL (ref >5.9)

## 2019-12-03 LAB — IRON AND TIBC
Iron: 14 ug/dL — ABNORMAL LOW (ref 45–182)
Saturation Ratios: 7 % — ABNORMAL LOW (ref 17.9–39.5)
TIBC: 195 ug/dL — ABNORMAL LOW (ref 250–450)
UIBC: 181 ug/dL

## 2019-12-03 LAB — FERRITIN: Ferritin: 29 ng/mL (ref 24–336)

## 2019-12-03 LAB — VITAMIN B12: Vitamin B-12: 457 pg/mL (ref 180–914)

## 2019-12-03 MED ORDER — ENOXAPARIN SODIUM 40 MG/0.4ML ~~LOC~~ SOLN
40.0000 mg | SUBCUTANEOUS | Status: DC
  Filled 2019-12-03: qty 0.4

## 2019-12-03 MED ORDER — SODIUM CHLORIDE 0.9 % IV SOLN
510.0000 mg | Freq: Once | INTRAVENOUS | Status: AC
  Administered 2019-12-03: 510 mg via INTRAVENOUS
  Filled 2019-12-03: qty 17

## 2019-12-03 NOTE — Progress Notes (Signed)
Pt receiving iron infusion, no complaints noted, no reactions noted

## 2019-12-03 NOTE — Progress Notes (Signed)
Initial Nutrition Assessment  DOCUMENTATION CODES:   Non-severe (moderate) malnutrition in context of chronic illness  INTERVENTION:  Continue Ensure Enlive po TID, each supplement provides 350 kcal and 20 grams of protein.  Provide MVI daily.  Continue vitamin C 500 mg BID.  NUTRITION DIAGNOSIS:   Moderate Malnutrition related to chronic illness (chronic stage IV pressure injuries) as evidenced by moderate fat depletion, moderate muscle depletion, severe muscle depletion.  GOAL:   Patient will meet greater than or equal to 90% of their needs  MONITOR:   PO intake, Supplement acceptance, Labs, Weight trends, Skin, I & O's  REASON FOR ASSESSMENT:   Malnutrition Screening Tool    ASSESSMENT:   44 year old male with PMHx of paraplegia, chronic decubitus ulcers, chronic osteomyelitis, neurogenic bladder with chronic indwelling Foley admitted with possible UTI.   Met with patient at bedside. He reports his appetite has been decreased for a while. He was unable to describe his typical intake at home and reports it depends on whether he is sick or not. He reports his appetite is starting to come back today. It appears he ate 100% of breakfast yesterday, 30% of lunch yesterday, and 50% of his dinner last night. No meal documentation completed for breakfast this morning. Patient is amenable to drinking Ensure Enlive to help meet calorie/protein needs.   Patient reports he has not been weighed so he is unsure of weight history. RD obtained bed scale weight of 48.1 kg (106.04 lbs). Weights inc hart fluctuate between 45-49 kg.  Medications reviewed and include: vitamin C 500 mg BID, ferrous sulfate 325 mg daily, NS at 100 mL/hr, ceftriaxone.  Labs reviewed: BUN 5, Creatinine 0.36.  NUTRITION - FOCUSED PHYSICAL EXAM:    Most Recent Value  Orbital Region Severe depletion  Upper Arm Region Moderate depletion  Thoracic and Lumbar Region Moderate depletion  Buccal Region Moderate  depletion  Temple Region Severe depletion  Clavicle Bone Region Moderate depletion  Clavicle and Acromion Bone Region Moderate depletion  Scapular Bone Region Unable to assess  Dorsal Hand Mild depletion  Patellar Region Severe depletion  Anterior Thigh Region Severe depletion  Posterior Calf Region Severe depletion  Edema (RD Assessment) None  Hair Reviewed  Eyes Reviewed  Mouth Reviewed  Skin Reviewed  Nails Reviewed     Diet Order:   Diet Order            Diet regular Room service appropriate? Yes; Fluid consistency: Thin  Diet effective now                EDUCATION NEEDS:   No education needs have been identified at this time  Skin:  Skin Assessment: Skin Integrity Issues: Skin Integrity Issues:: Stage IV Stage IV: sacrum, left ischial tuberosity, right ischial tuberosity, buttocks  Last BM:  12/02/2019  Height:   Ht Readings from Last 1 Encounters:  12/01/19 5' 10"  (1.778 m)   Weight:   Wt Readings from Last 1 Encounters:  12/03/19 48.1 kg   BMI:  Body mass index is 15.22 kg/m.  Estimated Nutritional Needs:   Kcal:  1700-1900  Protein:  85-95 grams  Fluid:  1.5-1.8 L/day  Jacklynn Barnacle, MS, RD, LDN Pager number available on Amion

## 2019-12-03 NOTE — Progress Notes (Signed)
PROGRESS NOTE  Jonathon York KDX:[id number] DOB: [birth year] DOA: 12/01/2019 PCP: Mick Sell, MD  HPI/Recap of past 24 hours: HPI from Dr Carmine Savoy is a 44 y.o. male with medical history significant for paraplegia, chronic osteomyelitis, chronic decubitus ulcers, neurogenic bladder with chronic indwelling Foley who presents for evaluation of nausea, vomiting and abdominal discomfort.  Patient states that he ate at a cookout and subsequently started experiencing these symptoms. Labs reveal a white count of 15.4, hemoglobin of 12.6, MCV of 68.9, serum bicarb of 15 with an anion gap of 22. Vital signs show T-max of 99.1, he was tachycardic with heart rate of 110, tachypneic with respiratory rate of 23. Patient also has significant pyuria. CT scan of abdomen and pelvis is negative for bowel obstruction or visible inflammation.  Patient admitted for further management.   Today, patient reported feeling okay, denied any new complaints.  Awaiting final urine culture prior to discharge as patient no longer wants to go to SNF and wants to go home.   Assessment/Plan: Principal Problem:   UTI (urinary tract infection) due to urinary indwelling catheter (HCC) Active Problems:   Paraplegia (HCC)   Neurogenic bladder   Decubitus ulcer of sacral region   Protein-calorie malnutrition, severe   UTI (urinary tract infection)   Possible UTI Paraplegia/neurogenic bladder/chronic indwelling Foley catheter Currently afebrile, with resolved leukocytosis UA showed large leukocytes, rare bacteria, greater than 50 WBC, UC pending collection Continue IV ceftriaxone for now Monitor closely  Noted hemoconcentration On admission, WBC 15.4, hemoglobin 12.6, platelets 547 Likely 2/2 dehydration Continue IV fluids  Hypokalemia Replace as needed  Sacral decubitus ulcer, stage IV History of chronic osteomyelitis Continue current wound care Patient reports that he is unable to care  for himself especially with his sacral decubitus ulcer, malnourished with poor wound healing, requesting to be placed in a nursing home for further assistance, no longer wants SNF, wants to go home  Chronic microcytic anemia Hemoglobin currently at baseline Anemia panel showed iron 14, sats 7, vitamin B12 457, No evidence of bleeding Continue iron supplementation, will give 1 dose of Feraheme Daily CBC  Moderate malnutrition Continue supplements        Malnutrition Type:  Nutrition Problem: Moderate Malnutrition Etiology: chronic illness (chronic stage IV pressure injuries)   Malnutrition Characteristics:  Signs/Symptoms: moderate fat depletion, moderate muscle depletion, severe muscle depletion   Nutrition Interventions:  Interventions: Refer to RD note for recommendations    Estimated body mass index is 15.22 kg/m as calculated from the following:   Height as of this encounter: 5\' 10"  (1.778 m).   Weight as of this encounter: 48.1 kg.     Code Status: Full  Family Communication: Discussed with patient extensively  Disposition Plan: Status is: Inpatient  The patient will require care spanning > 2 midnights and should be moved to inpatient because: Inpatient level of care appropriate due to severity of illness  Dispo: The patient is from: Home              Anticipated d/c is to: Home              Anticipated d/c date is: 1 day              Patient currently is not medically stable to d/c.    Consultants:  None  Procedures: None  Antimicrobials:  Ceftriaxone  DVT prophylaxis: Lovenox   Objective: Vitals:   12/03/19 0743 12/03/19 1207 12/03/19 1506 12/03/19 1641  BP: 112/66 104/63  111/67  Pulse: 87 78  74  Resp: 14 16  18   Temp: 98.2 F (36.8 C) 98.6 F (37 C)  98.4 F (36.9 C)  TempSrc: Oral Oral  Oral  SpO2: 99% (!) 85%  99%  Weight:   48.1 kg   Height:        Intake/Output Summary (Last 24 hours) at 12/03/2019 1752 Last data filed  at 12/03/2019 1713 Gross per 24 hour  Intake 3890.52 ml  Output 650 ml  Net 3240.52 ml   Filed Weights   12/01/19 0128 12/03/19 1506  Weight: 49.9 kg 48.1 kg    Exam:  General: NAD   Cardiovascular: S1, S2 present  Respiratory: CTAB  Abdomen: Soft, nontender, nondistended, bowel sounds present  Musculoskeletal: No bilateral pedal edema noted  Skin:  Sacral decubitus ulcer  Psychiatry: Normal mood   Data Reviewed: CBC: Recent Labs  Lab 12/01/19 0132 12/02/19 0530 12/03/19 0448  WBC 15.4* 7.4 6.8  NEUTROABS  --   --  3.9  HGB 12.6* 9.8* 8.9*  HCT 42.5 33.7* 28.9*  MCV 68.9* 70.2* 67.5*  PLT 547* 336 290   Basic Metabolic Panel: Recent Labs  Lab 12/01/19 0132 12/02/19 0530 12/03/19 0448  NA 137 139 142  K 3.6 3.2* 3.5  CL 100 108 110  CO2 15* 24 26  GLUCOSE 113* 111* 99  BUN 16 7 5*  CREATININE 0.61 0.38* 0.36*  CALCIUM 9.0 8.6* 8.2*   GFR: Estimated Creatinine Clearance: 80.2 mL/min (A) (by C-G formula based on SCr of 0.36 mg/dL (L)). Liver Function Tests: Recent Labs  Lab 12/01/19 0132  AST 13*  ALT 11  ALKPHOS 84  BILITOT 1.5*  PROT 8.6*  ALBUMIN 4.1   Recent Labs  Lab 12/01/19 0132  LIPASE 20   No results for input(s): AMMONIA in the last 168 hours. Coagulation Profile: No results for input(s): INR, PROTIME in the last 168 hours. Cardiac Enzymes: No results for input(s): CKTOTAL, CKMB, CKMBINDEX, TROPONINI in the last 168 hours. BNP (last 3 results) No results for input(s): PROBNP in the last 8760 hours. HbA1C: No results for input(s): HGBA1C in the last 72 hours. CBG: No results for input(s): GLUCAP in the last 168 hours. Lipid Profile: No results for input(s): CHOL, HDL, LDLCALC, TRIG, CHOLHDL, LDLDIRECT in the last 72 hours. Thyroid Function Tests: No results for input(s): TSH, T4TOTAL, FREET4, T3FREE, THYROIDAB in the last 72 hours. Anemia Panel: Recent Labs    12/03/19 0448  VITAMINB12 457  FOLATE 9.9  FERRITIN 29    TIBC 195*  IRON 14*   Urine analysis:    Component Value Date/Time   COLORURINE YELLOW (A) 12/01/2019 0540   APPEARANCEUR CLOUDY (A) 12/01/2019 0540   APPEARANCEUR Turbid 12/24/2013 0313   LABSPEC 1.033 (H) 12/01/2019 0540   LABSPEC 1.021 12/24/2013 0313   PHURINE 6.0 12/01/2019 0540   GLUCOSEU NEGATIVE 12/01/2019 0540   GLUCOSEU Negative 12/24/2013 0313   HGBUR MODERATE (A) 12/01/2019 0540   BILIRUBINUR NEGATIVE 12/01/2019 0540   BILIRUBINUR Negative 12/24/2013 0313   KETONESUR 80 (A) 12/01/2019 0540   PROTEINUR >=300 (A) 12/01/2019 0540   NITRITE NEGATIVE 12/01/2019 0540   LEUKOCYTESUR LARGE (A) 12/01/2019 0540   LEUKOCYTESUR 1+ 12/24/2013 0313   Sepsis Labs: @LABRCNTIP (procalcitonin:4,lacticidven:4)  ) Recent Results (from the past 240 hour(s))  SARS Coronavirus 2 by RT PCR (hospital order, performed in [facility] hospital lab) Nasopharyngeal Nasopharyngeal Swab     Status: None   Collection  Time: 12/01/19  8:00 AM   Specimen: Nasopharyngeal Swab  Result Value Ref Range Status   SARS Coronavirus 2 NEGATIVE NEGATIVE Final    Comment: (NOTE) SARS-CoV-2 target nucleic acids are NOT DETECTED.  The SARS-CoV-2 RNA is generally detectable in upper and lower respiratory specimens during the acute phase of infection. The lowest concentration of SARS-CoV-2 viral copies this assay can detect is 250 copies / mL. A negative result does not preclude SARS-CoV-2 infection and should not be used as the sole basis for treatment or other patient management decisions.  A negative result may occur with improper specimen collection / handling, submission of specimen other than nasopharyngeal swab, presence of viral mutation(s) within the areas targeted by this assay, and inadequate number of viral copies (<250 copies / mL). A negative result must be combined with clinical observations, patient history, and epidemiological information.  Fact Sheet for Patients:    BoilerBrush.com.cy  Fact Sheet for Healthcare Providers: https://pope.com/  This test is not yet approved or  cleared by the [place] FDA and has been authorized for detection and/or diagnosis of SARS-CoV-2 by FDA under an Emergency Use Authorization (EUA).  This EUA will remain in effect (meaning this test can be used) for the duration of the COVID-19 declaration under Section 564(b)(1) of the Act, 21 U.S.C. section 360bbb-3(b)(1), unless the authorization is terminated or revoked sooner.  Performed at [facility], [address], [place], [place] [address]       Studies: No results found.  Scheduled Meds: . ascorbic acid  500 mg Oral BID  . Chlorhexidine Gluconate Cloth  6 each Topical Daily  . [START ON 12/04/2019] enoxaparin (LOVENOX) injection  40 mg Subcutaneous Q24H  . feeding supplement (ENSURE ENLIVE)  237 mL Oral TID BM  . ferrous sulfate  325 mg Oral Q breakfast  . sodium hypochlorite  1 application Irrigation q morning - 10a    Continuous Infusions: . cefTRIAXone (ROCEPHIN)  IV Stopped (12/03/19 0816)  . ferumoxytol       LOS: 1 day     Briant Cedar, MD [facility]  If 7PM-7AM, please contact night-coverage [email or web address] 12/03/2019, 5:52 PM

## 2019-12-03 NOTE — TOC Progression Note (Signed)
Transition of Care [facility]) - Progression Note    Patient Details  Name: AMARII BORDAS MRN: [id number] Date of Birth: [birth year]  Transition of [facility]) CM/SW Contact  Prosperity Darrough, Lemar Livings, LCSW Phone Number: 12/03/2019, 3:31 PM  Clinical Narrative:  Spoke with pt who has changed his mind and wants to go home now. He feels he can do at home what they are doing here and what will do in a SNF. He has been to [facility] and knows what it is like a them.   He has no preference regarding HH agencies-ref made to [facility] for RN follow up. Has all needed equipment at home. He will call his daughter to transport him home once DC paperwork completed. Trying to find out if MD planning on discharging today.     Expected Discharge Plan: Skilled Nursing Facility Barriers to Discharge: Continued Medical Work up, Other (comment) (Pt observation status and has Medicare)  Expected Discharge Plan and Services Expected Discharge Plan: Skilled Nursing Facility In-house Referral: Clinical Social Work   Post Acute Care Choice: Skilled Nursing Facility Living arrangements for the past 2 months: Single Family Home                                       Social Determinants of Health (SDOH) Interventions    Readmission Risk Interventions No flowsheet data found.

## 2019-12-03 NOTE — TOC Progression Note (Signed)
Transition of Care [facility]) - Progression Note    Patient Details  Name: Jonathon York MRN: [id number] Date of Birth: Jan 10, 1976  Transition of Care [facility]) CM/SW Contact  Hubbard Seldon, Lemar Livings, LCSW Phone Number: 12/03/2019, 9:10 AM  Clinical Narrative: Bed search initiated and will try to get closer to [facility] where his MD's follow. He is aware he will need a three night stay for his Medicare to cover. Pt is not vaccinated and will need COVID test prior to transfer to facility. Await bed offers      Expected Discharge Plan: Skilled Nursing Facility Barriers to Discharge: Continued Medical Work up, Other (comment) (Pt observation status and has Medicare)  Expected Discharge Plan and Services Expected Discharge Plan: Skilled Nursing Facility In-house Referral: Clinical Social Work   Post Acute Care Choice: Skilled Nursing Facility Living arrangements for the past 2 months: Single Family Home                                       Social Determinants of Health (SDOH) Interventions    Readmission Risk Interventions No flowsheet data found.

## 2019-12-03 NOTE — TOC Transition Note (Signed)
Transition of Care [facility]) - CM/SW Discharge Note   Patient Details  Name: Jonathon York MRN: [id number] Date of Birth: Jan 09, 1976  Transition of Care [facility]) CM/SW Contact:  Lucy Chris, LCSW Phone Number: 12/03/2019, 3:53 PM   Clinical Narrative: Pt has decided to go home with home health. No preference referral made to [facility] for RN follow up. Pt plans to call daughter to transport him home when DC. Has all equipment at home that is needed.       Final next level of care: Home w Home Health Services Barriers to Discharge: Barriers Resolved   Patient Goals and CMS Choice Patient states their goals for this hospitalization and ongoing recovery are:: I guess I need to go to a rehab where my wounds can heal and I can gain weight CMS Medicare.gov Compare Post Acute Care list provided to:: Patient Choice offered to / list presented to : Patient  Discharge Placement                Patient to be transferred to facility by: Daughter via car Name of family member notified: Taylor-daughter Patient and family notified of of transfer: 12/03/19  Discharge Plan and Services In-house Referral: Clinical Social Work   Post Acute Care Choice: Skilled Nursing Facility                    HH Arranged: RN HH Agency: [facility] Date HH Agency Contacted: 12/03/19 Time HH Agency Contacted: 1552 Representative spoke with at [facility] Agency: Barbara Cower  Social Determinants of Health (SDOH) Interventions     Readmission Risk Interventions No flowsheet data found.

## 2019-12-04 LAB — URINE CULTURE: Culture: 10000 — AB

## 2019-12-04 MED ORDER — AMOXICILLIN 500 MG PO CAPS
500.0000 mg | ORAL_CAPSULE | Freq: Three times a day (TID) | ORAL | Status: DC
  Administered 2019-12-04 – 2019-12-05 (×3): 500 mg via ORAL
  Filled 2019-12-04 (×6): qty 1

## 2019-12-04 NOTE — Progress Notes (Signed)
PROGRESS NOTE  RHILEY SOLEM DQQ:[id number] DOB: 31-Mar-1976 DOA: 12/01/2019 PCP: Mick Sell, MD  HPI/Recap of past 24 hours: HPI from Dr Carmine Savoy is a 44 y.o. male with medical history significant for paraplegia, chronic osteomyelitis, chronic decubitus ulcers, neurogenic bladder with chronic indwelling Foley who presents for evaluation of nausea, vomiting and abdominal discomfort.  Patient states that he ate at a cookout and subsequently started experiencing these symptoms. Labs reveal a white count of 15.4, hemoglobin of 12.6, MCV of 68.9, serum bicarb of 15 with an anion gap of 22. Vital signs show T-max of 99.1, he was tachycardic with heart rate of 110, tachypneic with respiratory rate of 23. Patient also has significant pyuria. CT scan of abdomen and pelvis is negative for bowel obstruction or visible inflammation.  Patient admitted for further management.    Today, patient denies any new complaints.  Patient still wanting to be discharged to a SNF, alerted [facility] team.  Reports he is not able to care for himself especially due to his chronic decubitus ulcer     Assessment/Plan: Principal Problem:   UTI (urinary tract infection) due to urinary indwelling catheter (HCC) Active Problems:   Paraplegia (HCC)   Neurogenic bladder   Decubitus ulcer of sacral region   Protein-calorie malnutrition, severe   UTI (urinary tract infection)   Possible Enterococcus faecalis UTI Paraplegia/neurogenic bladder/chronic indwelling Foley catheter Currently afebrile, with resolved leukocytosis UA showed large leukocytes, rare bacteria, greater than 50 WBC, UC grew 10,000 Enterococcus faecalis S/P IV ceftriaxone, switched to p.o. amoxicillin to complete 7 days Monitor closely  Noted hemoconcentration Improved On admission, WBC 15.4, hemoglobin 12.6, platelets 547 Likely 2/2 dehydration S/p IV fluids  Hypokalemia Replace as needed  Sacral decubitus ulcer, stage  IV History of chronic osteomyelitis Continue current wound care Patient reports that he is unable to care for himself especially with his sacral decubitus ulcer, malnourished with poor wound healing, requesting to be placed in a nursing home for further assistance  Chronic microcytic anemia Hemoglobin currently at baseline Anemia panel showed iron 14, sats 7, vitamin B12 457, No evidence of bleeding Continue iron supplementation, s/p 1 dose of Feraheme on 12/03/19 Daily CBC  Moderate malnutrition Continue supplements        Malnutrition Type:  Nutrition Problem: Moderate Malnutrition Etiology: chronic illness (chronic stage IV pressure injuries)   Malnutrition Characteristics:  Signs/Symptoms: moderate fat depletion, moderate muscle depletion, severe muscle depletion   Nutrition Interventions:  Interventions: Refer to RD note for recommendations    Estimated body mass index is 15.22 kg/m as calculated from the following:   Height as of this encounter: 5\' 10"  (1.778 m).   Weight as of this encounter: 48.1 kg.     Code Status: Full  Family Communication: Discussed with patient extensively  Disposition Plan: Status is: Inpatient  The patient will require care spanning > 2 midnights and should be moved to inpatient because: Inpatient level of care appropriate due to severity of illness  Dispo: The patient is from: Home              Anticipated d/c is to: SNF              Anticipated d/c date is: 1 day              Patient currently is medically stable to d/c.    Consultants:  None  Procedures: None  Antimicrobials:  Amoxicillin  DVT prophylaxis: Lovenox   Objective: Vitals:  12/04/19 0010 12/04/19 0358 12/04/19 0729 12/04/19 1315  BP: 99/62 99/63 103/64 114/71  Pulse: 90 72 65 78  Resp: 16 16 16 16   Temp: 98.5 F (36.9 C) 98 F (36.7 C) 98 F (36.7 C) 98.5 F (36.9 C)  TempSrc: Oral Oral Oral Oral  SpO2: 99% 98% 98% 100%  Weight:       Height:        Intake/Output Summary (Last 24 hours) at 12/04/2019 1558 Last data filed at 12/04/2019 0940 Gross per 24 hour  Intake 2052.02 ml  Output 375 ml  Net 1677.02 ml   Filed Weights   12/01/19 0128 12/03/19 1506  Weight: 49.9 kg 48.1 kg    Exam:  General: NAD   Cardiovascular: S1, S2 present  Respiratory: CTAB  Abdomen: Soft, nontender, nondistended, bowel sounds present  Musculoskeletal: No bilateral pedal edema noted  Skin:  Sacral decubitus ulcer  Psychiatry: Normal mood   Data Reviewed: CBC: Recent Labs  Lab 12/01/19 0132 12/02/19 0530 12/03/19 0448  WBC 15.4* 7.4 6.8  NEUTROABS  --   --  3.9  HGB 12.6* 9.8* 8.9*  HCT 42.5 33.7* 28.9*  MCV 68.9* 70.2* 67.5*  PLT 547* 336 290   Basic Metabolic Panel: Recent Labs  Lab 12/01/19 0132 12/02/19 0530 12/03/19 0448  NA 137 139 142  K 3.6 3.2* 3.5  CL 100 108 110  CO2 15* 24 26  GLUCOSE 113* 111* 99  BUN 16 7 5*  CREATININE 0.61 0.38* 0.36*  CALCIUM 9.0 8.6* 8.2*   GFR: Estimated Creatinine Clearance: 80.2 mL/min (A) (by C-G formula based on SCr of 0.36 mg/dL (L)). Liver Function Tests: Recent Labs  Lab 12/01/19 0132  AST 13*  ALT 11  ALKPHOS 84  BILITOT 1.5*  PROT 8.6*  ALBUMIN 4.1   Recent Labs  Lab 12/01/19 0132  LIPASE 20   No results for input(s): AMMONIA in the last 168 hours. Coagulation Profile: No results for input(s): INR, PROTIME in the last 168 hours. Cardiac Enzymes: No results for input(s): CKTOTAL, CKMB, CKMBINDEX, TROPONINI in the last 168 hours. BNP (last 3 results) No results for input(s): PROBNP in the last 8760 hours. HbA1C: No results for input(s): HGBA1C in the last 72 hours. CBG: No results for input(s): GLUCAP in the last 168 hours. Lipid Profile: No results for input(s): CHOL, HDL, LDLCALC, TRIG, CHOLHDL, LDLDIRECT in the last 72 hours. Thyroid Function Tests: No results for input(s): TSH, T4TOTAL, FREET4, T3FREE, THYROIDAB in the last 72  hours. Anemia Panel: Recent Labs    12/03/19 0448  VITAMINB12 457  FOLATE 9.9  FERRITIN 29  TIBC 195*  IRON 14*   Urine analysis:    Component Value Date/Time   COLORURINE YELLOW (A) 12/01/2019 0540   APPEARANCEUR CLOUDY (A) 12/01/2019 0540   APPEARANCEUR Turbid 12/24/2013 0313   LABSPEC 1.033 (H) 12/01/2019 0540   LABSPEC 1.021 12/24/2013 0313   PHURINE 6.0 12/01/2019 0540   GLUCOSEU NEGATIVE 12/01/2019 0540   GLUCOSEU Negative 12/24/2013 0313   HGBUR MODERATE (A) 12/01/2019 0540   BILIRUBINUR NEGATIVE 12/01/2019 0540   BILIRUBINUR Negative 12/24/2013 0313   KETONESUR 80 (A) 12/01/2019 0540   PROTEINUR >=300 (A) 12/01/2019 0540   NITRITE NEGATIVE 12/01/2019 0540   LEUKOCYTESUR LARGE (A) 12/01/2019 0540   LEUKOCYTESUR 1+ 12/24/2013 0313   Sepsis Labs: @LABRCNTIP (procalcitonin:4,lacticidven:4)  ) Recent Results (from the past 240 hour(s))  SARS Coronavirus 2 by RT PCR (hospital order, performed in [facility] hospital lab) Nasopharyngeal Nasopharyngeal Swab  Status: None   Collection Time: 12/01/19  8:00 AM   Specimen: Nasopharyngeal Swab  Result Value Ref Range Status   SARS Coronavirus 2 NEGATIVE NEGATIVE Final    Comment: (NOTE) SARS-CoV-2 target nucleic acids are NOT DETECTED.  The SARS-CoV-2 RNA is generally detectable in upper and lower respiratory specimens during the acute phase of infection. The lowest concentration of SARS-CoV-2 viral copies this assay can detect is 250 copies / mL. A negative result does not preclude SARS-CoV-2 infection and should not be used as the sole basis for treatment or other patient management decisions.  A negative result may occur with improper specimen collection / handling, submission of specimen other than nasopharyngeal swab, presence of viral mutation(s) within the areas targeted by this assay, and inadequate number of viral copies (<250 copies / mL). A negative result must be combined with clinical observations,  patient history, and epidemiological information.  Fact Sheet for Patients:   BoilerBrush.com.cy  Fact Sheet for Healthcare Providers: https://pope.com/  This test is not yet approved or  cleared by the [place] FDA and has been authorized for detection and/or diagnosis of SARS-CoV-2 by FDA under an Emergency Use Authorization (EUA).  This EUA will remain in effect (meaning this test can be used) for the duration of the COVID-19 declaration under Section 564(b)(1) of the Act, 21 U.S.C. section 360bbb-3(b)(1), unless the authorization is terminated or revoked sooner.  Performed at [facility], [address], [place], [place] [address]   Urine Culture     Status: Abnormal   Collection Time: 12/02/19  2:47 PM   Specimen: Urine, Random  Result Value Ref Range Status   Specimen Description   Final    URINE, RANDOM Performed at [facility], [address], [place], [place] [address]    Special Requests   Final    NONE Performed at [facility], [address] [place], [place] [address]    Culture 10,000 COLONIES/mL ENTEROCOCCUS FAECALIS (A)  Final   Report Status 12/04/2019 FINAL  Final   Organism ID, Bacteria ENTEROCOCCUS FAECALIS (A)  Final      Susceptibility   Enterococcus faecalis - MIC*    AMPICILLIN <=2 SENSITIVE Sensitive     NITROFURANTOIN <=16 SENSITIVE Sensitive     VANCOMYCIN 1 SENSITIVE Sensitive     * 10,000 COLONIES/mL ENTEROCOCCUS FAECALIS      Studies: No results found.  Scheduled Meds: . amoxicillin  500 mg Oral Q8H  . ascorbic acid  500 mg Oral BID  . Chlorhexidine Gluconate Cloth  6 each Topical Daily  . enoxaparin (LOVENOX) injection  40 mg Subcutaneous Q24H  . feeding supplement (ENSURE ENLIVE)  237 mL Oral TID BM  . ferrous sulfate  325 mg Oral Q breakfast    Continuous Infusions:    LOS: 2 days     Briant Cedar, MD [facility]  If  7PM-7AM, please contact night-coverage [email or web address] 12/04/2019, 3:58 PM

## 2019-12-05 MED ORDER — DAKINS (1/4 STRENGTH) 0.125 % EX SOLN: 1.0000 "application " | Freq: Every morning | CUTANEOUS | 0 refills | Status: DC

## 2019-12-05 MED ORDER — ADULT MULTIVITAMIN W/MINERALS CH: 1.0000 | ORAL_TABLET | Freq: Every day | ORAL | 0 refills | Status: AC

## 2019-12-05 MED ORDER — FERROUS SULFATE 325 (65 FE) MG PO TABS: 325.0000 mg | ORAL_TABLET | Freq: Every day | ORAL | 0 refills | Status: DC

## 2019-12-05 MED ORDER — ENSURE ENLIVE PO LIQD: 237.0000 mL | Freq: Three times a day (TID) | ORAL | 12 refills | Status: DC

## 2019-12-05 MED ORDER — AMOXICILLIN 500 MG PO CAPS: 500.0000 mg | ORAL_CAPSULE | Freq: Three times a day (TID) | ORAL | 0 refills | Status: AC

## 2019-12-05 MED ORDER — ASCORBIC ACID 500 MG PO TABS: 500.0000 mg | ORAL_TABLET | Freq: Two times a day (BID) | ORAL | 0 refills | Status: AC

## 2019-12-05 NOTE — Discharge Summary (Signed)
Discharge Summary  Jonathon York INO:[id number] DOB: 12-31-1975  PCP: Mick Sell, MD  Admit date: 12/01/2019 Discharge date: 12/05/2019  Time spent: 40 mins  Recommendations for Outpatient Follow-up:  1. PCP in 1 week 2. Follow-up with wound care center  Discharge Diagnoses:  Active Hospital Problems   Diagnosis Date Noted  . UTI (urinary tract infection) due to urinary indwelling catheter (HCC) 12/01/2019  . UTI (urinary tract infection) 12/02/2019  . Protein-calorie malnutrition, severe 11/20/2016  . Decubitus ulcer of sacral region   . Neurogenic bladder 04/30/2015  . Paraplegia (HCC) 04/30/2015    Resolved Hospital Problems  No resolved problems to display.    Discharge Condition: Stable  Diet recommendation: Regular diet  Vitals:   12/05/19 0821 12/05/19 1226  BP: 97/67 95/62  Pulse: 65 76  Resp: 18 18  Temp: 98.2 F (36.8 C) 98.3 F (36.8 C)  SpO2: 99% 99%    History of present illness:  Jonathon L Wilsonis a 44 y.o.malewith medical history significant forparaplegia, chronic osteomyelitis, chronic decubitus ulcers, neurogenic bladder with chronic indwelling Foley who presents for evaluation of nausea, vomiting and abdominal discomfort. Patient states that he ate at acookout and subsequently started experiencing thesesymptoms. Labs reveal a white count of 15.4, hemoglobin of 12.6, MCV of 68.9, serum bicarb of 15 with an anion gap of 22. Vital signs show T-max of 99.1,he was tachycardic with heart rate of 110, tachypneic with respiratory rate of 23. Patient also has significant pyuria. CT scan of abdomen and pelvis is negative for bowel obstruction or visible inflammation.  Patient admitted for further management.   Earlier today, patient insisting he wants to be placed at a SNF for better wound care management.  TOC team notified.  Later this evening patient states he no longer is interested in SNF placement and wants to be discharged home.  Patient  advised to follow-up with PCP in 1 week as well as follow-up with wound care center.    Hospital Course:  Principal Problem:   UTI (urinary tract infection) due to urinary indwelling catheter (HCC) Active Problems:   Paraplegia (HCC)   Neurogenic bladder   Decubitus ulcer of sacral region   Protein-calorie malnutrition, severe   UTI (urinary tract infection)   Possible Enterococcus faecalis UTI Paraplegia/neurogenic bladder/chronic indwelling Foley catheter Currently afebrile, with resolved leukocytosis UA showed large leukocytes, rare bacteria, greater than 50 WBC, UC grew 10,000 Enterococcus faecalis S/P IV ceftriaxone, switched to p.o. amoxicillin to complete 7 days of AB Follow-up with PCP in 1 week  Noted hemoconcentration Resolved On admission, WBC 15.4, hemoglobin 12.6, platelets 547 Likely 2/2 dehydration S/p IV fluids  Hypokalemia Replaced as needed  Sacral decubitus ulcer, stage IV History of chronic osteomyelitis Patient reports that he is unable to care for himself especially with his sacral decubitus ulcer, malnourished with poor wound healing, requesting to be placed in a nursing home for further assistance, later stated he wants to be discharged Follow-up with wound care center  Chronic microcytic anemia Hemoglobin currently at baseline Anemia panel showed iron 14, sats 7, vitamin B12 457, No evidence of bleeding Continue iron supplementation, s/p 1 dose of Feraheme on 12/03/19 Follow-up with PCP  Moderate malnutrition Continue supplements       Malnutrition Type:  Nutrition Problem: Moderate Malnutrition Etiology: chronic illness (chronic stage IV pressure injuries)   Malnutrition Characteristics:  Signs/Symptoms: moderate fat depletion, moderate muscle depletion, severe muscle depletion   Nutrition Interventions:  Interventions: Refer to RD note for recommendations  Estimated body mass index is 15.22 kg/m as calculated from the  following:   Height as of this encounter: 5\' 10"  (1.778 m).   Weight as of this encounter: 48.1 kg.    Procedures:  None  Consultations:  None  Discharge Exam: BP 95/62 (BP Location: Right Arm)   Pulse 76   Temp 98.3 F (36.8 C) (Oral)   Resp 18   Ht 5\' 10"  (1.778 m)   Wt 48.1 kg Comment: bed scale  SpO2 99%   BMI 15.22 kg/m   General: NAD Cardiovascular: S1, S2 present Respiratory: CTA B    Discharge Instructions You were cared for by a hospitalist during your hospital stay. If you have any questions about your discharge medications or the care you received while you were in the hospital after you are discharged, you can call the unit and asked to speak with the hospitalist on call if the hospitalist that took care of you is not available. Once you are discharged, your primary care physician will handle any further medical issues. Please note that NO REFILLS for any discharge medications will be authorized once you are discharged, as it is imperative that you return to your primary care physician (or establish a relationship with a primary care physician if you do not have one) for your aftercare needs so that they can reassess your need for medications and monitor your lab values.  Discharge Instructions    Diet - low sodium heart healthy   Complete by: As directed    Discharge wound care:   Complete by: As directed    Increase activity slowly   Complete by: As directed      Allergies as of 12/05/2019      Reactions   Orange Fruit [citrus] Other (See Comments)   Blisters only from ORANGES!!! Patient is not allergic from all citrus      Medication List    TAKE these medications   amoxicillin 500 MG capsule Commonly known as: AMOXIL Take 1 capsule (500 mg total) by mouth every 8 (eight) hours for 3 days.   ascorbic acid 500 MG tablet Commonly known as: VITAMIN C Take 1 tablet (500 mg total) by mouth 2 (two) times daily.   feeding supplement (ENSURE ENLIVE)  Liqd Take 237 mLs by mouth 3 (three) times daily between meals.   ferrous sulfate 325 (65 FE) MG tablet Take 1 tablet (325 mg total) by mouth daily with breakfast.   HYDROcodone-acetaminophen 5-325 MG tablet Commonly known as: NORCO/VICODIN Take 1 tablet by mouth every 6 (six) hours as needed for moderate pain.   ibuprofen 200 MG tablet Commonly known as: ADVIL Take by mouth.   multivitamin with minerals Tabs tablet Take 1 tablet by mouth daily.   ondansetron 4 MG tablet Commonly known as: ZOFRAN Take 4 mg by mouth every 6 (six) hours as needed for nausea or vomiting.   sodium hypochlorite 0.125 % Soln Commonly known as: DAKIN'S 1/4 STRENGTH Irrigate with 1 application as directed every morning.            Discharge Care Instructions  (From admission, onward)         Start     Ordered   12/05/19 0000  Discharge wound care:        12/05/19 1722         Allergies  Allergen Reactions  . Orange Fruit [Citrus] Other (See Comments)    Blisters only from ORANGES!!! Patient is not allergic from all citrus  Follow-up Information    Mick Sell, MD. Schedule an appointment as soon as possible for a visit in 1 week(s).   Specialty: Infectious Diseases Contact information: [address] [place] [address] [phone]                The results of significant diagnostics from this hospitalization (including imaging, microbiology, ancillary and laboratory) are listed below for reference.    Significant Diagnostic Studies: CT ABDOMEN PELVIS W CONTRAST  Result Date: 12/01/2019 CLINICAL DATA:  Nausea and vomiting for 3 days EXAM: CT ABDOMEN AND PELVIS WITH CONTRAST TECHNIQUE: Multidetector CT imaging of the abdomen and pelvis was performed using the standard protocol following bolus administration of intravenous contrast. CONTRAST:  35mL OMNIPAQUE IOHEXOL 300 MG/ML  SOLN COMPARISON:  10/27/2019 FINDINGS: Lower chest:  No contributory findings.  Hepatobiliary: Small right upper hepatic cystic density. No acute or interval finding.No evidence of biliary obstruction or stone. Pancreas: Unremarkable. Spleen: Unremarkable. Adrenals/Urinary Tract: Negative adrenals. No hydronephrosis or stone. Small renal cystic densities, more numerous on the left. Chronic bladder wall thickening and indwelling catheter. Stomach/Bowel: No obstruction. No visible bowel inflammation, including appendicitis Vascular/Lymphatic: Premature atherosclerotic calcifications of the aorta and iliacs. No mass or adenopathy. Reproductive:No acute finding Other: No ascites or pneumoperitoneum. Musculoskeletal: Paraplegia with muscular atrophy and disuse osteopenia of the visualized pelvis and lower limbs. There is chronic sacral and ischial decubitus ulcers with extension to bone and ischial/acral erosions. The right-sided ischial ulcer chronically tunnels to the posterior urethra with gas circling the urinary catheter. No abscess. No acute osseous finding. Prior spinal fusion. IMPRESSION: Negative for bowel obstruction or visible inflammation. Sequela of paralysis including chronic decubitus ulcers. The right ischial ulcer is again seen to extend to the posterior urethra. Electronically Signed   By: Marnee Spring M.D.   On: 12/01/2019 05:06    Microbiology: Recent Results (from the past 240 hour(s))  SARS Coronavirus 2 by RT PCR (hospital order, performed in [facility] hospital lab) Nasopharyngeal Nasopharyngeal Swab     Status: None   Collection Time: 12/01/19  8:00 AM   Specimen: Nasopharyngeal Swab  Result Value Ref Range Status   SARS Coronavirus 2 NEGATIVE NEGATIVE Final    Comment: (NOTE) SARS-CoV-2 target nucleic acids are NOT DETECTED.  The SARS-CoV-2 RNA is generally detectable in upper and lower respiratory specimens during the acute phase of infection. The lowest concentration of SARS-CoV-2 viral copies this assay can detect is 250 copies / mL. A negative result  does not preclude SARS-CoV-2 infection and should not be used as the sole basis for treatment or other patient management decisions.  A negative result may occur with improper specimen collection / handling, submission of specimen other than nasopharyngeal swab, presence of viral mutation(s) within the areas targeted by this assay, and inadequate number of viral copies (<250 copies / mL). A negative result must be combined with clinical observations, patient history, and epidemiological information.  Fact Sheet for Patients:   BoilerBrush.com.cy  Fact Sheet for Healthcare Providers: https://pope.com/  This test is not yet approved or  cleared by the [place] FDA and has been authorized for detection and/or diagnosis of SARS-CoV-2 by FDA under an Emergency Use Authorization (EUA).  This EUA will remain in effect (meaning this test can be used) for the duration of the COVID-19 declaration under Section 564(b)(1) of the Act, 21 U.S.C. section 360bbb-3(b)(1), unless the authorization is terminated or revoked sooner.  Performed at [facility], [address]  [place], [place] [address]   Urine Culture     Status: Abnormal   Collection Time: 12/02/19  2:47 PM   Specimen: Urine, Random  Result Value Ref Range Status   Specimen Description   Final    URINE, RANDOM Performed at [facility], [address] [place], [place] [address]    Special Requests   Final    NONE Performed at [facility], [address] [place], [place] [address]    Culture 10,000 COLONIES/mL ENTEROCOCCUS FAECALIS (A)  Final   Report Status 12/04/2019 FINAL  Final   Organism ID, Bacteria ENTEROCOCCUS FAECALIS (A)  Final      Susceptibility   Enterococcus faecalis - MIC*    AMPICILLIN <=2 SENSITIVE Sensitive     NITROFURANTOIN <=16 SENSITIVE Sensitive     VANCOMYCIN 1 SENSITIVE Sensitive     * 10,000 COLONIES/mL ENTEROCOCCUS  FAECALIS     Labs: Basic Metabolic Panel: Recent Labs  Lab 12/01/19 0132 12/02/19 0530 12/03/19 0448  NA 137 139 142  K 3.6 3.2* 3.5  CL 100 108 110  CO2 15* 24 26  GLUCOSE 113* 111* 99  BUN 16 7 5*  CREATININE 0.61 0.38* 0.36*  CALCIUM 9.0 8.6* 8.2*   Liver Function Tests: Recent Labs  Lab 12/01/19 0132  AST 13*  ALT 11  ALKPHOS 84  BILITOT 1.5*  PROT 8.6*  ALBUMIN 4.1   Recent Labs  Lab 12/01/19 0132  LIPASE 20   No results for input(s): AMMONIA in the last 168 hours. CBC: Recent Labs  Lab 12/01/19 0132 12/02/19 0530 12/03/19 0448  WBC 15.4* 7.4 6.8  NEUTROABS  --   --  3.9  HGB 12.6* 9.8* 8.9*  HCT 42.5 33.7* 28.9*  MCV 68.9* 70.2* 67.5*  PLT 547* 336 290   Cardiac Enzymes: No results for input(s): CKTOTAL, CKMB, CKMBINDEX, TROPONINI in the last 168 hours. BNP: BNP (last 3 results) Recent Labs    10/27/19 0115  BNP 41.1    ProBNP (last 3 results) No results for input(s): PROBNP in the last 8760 hours.  CBG: No results for input(s): GLUCAP in the last 168 hours.     Signed:  Briant Cedar, MD [facility] 12/05/2019, 5:23 PM

## 2019-12-05 NOTE — Progress Notes (Signed)
PROGRESS NOTE  Jonathon STEGMANN EQA:[id number] DOB: [birth year] DOA: 12/01/2019 PCP: Mick Sell, MD  HPI/Recap of past 24 hours: HPI from Dr Carmine Savoy is a 44 y.o. male with medical history significant for paraplegia, chronic osteomyelitis, chronic decubitus ulcers, neurogenic bladder with chronic indwelling Foley who presents for evaluation of nausea, vomiting and abdominal discomfort.  Patient states that he ate at a cookout and subsequently started experiencing these symptoms. Labs reveal a white count of 15.4, hemoglobin of 12.6, MCV of 68.9, serum bicarb of 15 with an anion gap of 22. Vital signs show T-max of 99.1, he was tachycardic with heart rate of 110, tachypneic with respiratory rate of 23. Patient also has significant pyuria. CT scan of abdomen and pelvis is negative for bowel obstruction or visible inflammation.  Patient admitted for further management.    Today, patient denies any new complaints.  Awaiting SNF discharge     Assessment/Plan: Principal Problem:   UTI (urinary tract infection) due to urinary indwelling catheter (HCC) Active Problems:   Paraplegia (HCC)   Neurogenic bladder   Decubitus ulcer of sacral region   Protein-calorie malnutrition, severe   UTI (urinary tract infection)   Possible Enterococcus faecalis UTI Paraplegia/neurogenic bladder/chronic indwelling Foley catheter Currently afebrile, with resolved leukocytosis UA showed large leukocytes, rare bacteria, greater than 50 WBC, UC grew 10,000 Enterococcus faecalis S/P IV ceftriaxone, switched to p.o. amoxicillin to complete 7 days Monitor closely  Noted hemoconcentration Improved On admission, WBC 15.4, hemoglobin 12.6, platelets 547 Likely 2/2 dehydration S/p IV fluids  Hypokalemia Replace as needed  Sacral decubitus ulcer, stage IV History of chronic osteomyelitis Continue current wound care Patient reports that he is unable to care for himself especially with  his sacral decubitus ulcer, malnourished with poor wound healing, requesting to be placed in a nursing home for further assistance  Chronic microcytic anemia Hemoglobin currently at baseline Anemia panel showed iron 14, sats 7, vitamin B12 457, No evidence of bleeding Continue iron supplementation, s/p 1 dose of Feraheme on 12/03/19 Daily CBC  Moderate malnutrition Continue supplements        Malnutrition Type:  Nutrition Problem: Moderate Malnutrition Etiology: chronic illness (chronic stage IV pressure injuries)   Malnutrition Characteristics:  Signs/Symptoms: moderate fat depletion, moderate muscle depletion, severe muscle depletion   Nutrition Interventions:  Interventions: Refer to RD note for recommendations    Estimated body mass index is 15.22 kg/m as calculated from the following:   Height as of this encounter: 5\' 10"  (1.778 m).   Weight as of this encounter: 48.1 kg.     Code Status: Full  Family Communication: Discussed with patient extensively  Disposition Plan: Status is: Inpatient  The patient will require care spanning > 2 midnights and should be moved to inpatient because: Inpatient level of care appropriate due to severity of illness  Dispo: The patient is from: Home              Anticipated d/c is to: SNF              Anticipated d/c date is: 1 day              Patient currently is medically stable to d/c.    Consultants:  None  Procedures: None  Antimicrobials:  Amoxicillin  DVT prophylaxis: Lovenox   Objective: Vitals:   12/04/19 2354 12/05/19 0442 12/05/19 0821 12/05/19 1226  BP: 107/68 98/60 97/67  95/62  Pulse: 79 92 65 76  Resp: 15 17  18 18  Temp: 98.6 F (37 C) 98.4 F (36.9 C) 98.2 F (36.8 C) 98.3 F (36.8 C)  TempSrc: Oral Oral Oral Oral  SpO2: 100% 97% 99% 99%  Weight:      Height:        Intake/Output Summary (Last 24 hours) at 12/05/2019 1453 Last data filed at 12/05/2019 1340 Gross per 24 hour  Intake  360 ml  Output --  Net 360 ml   Filed Weights   12/01/19 0128 12/03/19 1506  Weight: 49.9 kg 48.1 kg    Exam:  General: NAD   Cardiovascular: S1, S2 present  Respiratory: CTAB  Abdomen: Soft, nontender, nondistended, bowel sounds present  Musculoskeletal: No bilateral pedal edema noted  Skin:  Sacral decubitus ulcer  Psychiatry: Normal mood   Data Reviewed: CBC: Recent Labs  Lab 12/01/19 0132 12/02/19 0530 12/03/19 0448  WBC 15.4* 7.4 6.8  NEUTROABS  --   --  3.9  HGB 12.6* 9.8* 8.9*  HCT 42.5 33.7* 28.9*  MCV 68.9* 70.2* 67.5*  PLT 547* 336 290   Basic Metabolic Panel: Recent Labs  Lab 12/01/19 0132 12/02/19 0530 12/03/19 0448  NA 137 139 142  K 3.6 3.2* 3.5  CL 100 108 110  CO2 15* 24 26  GLUCOSE 113* 111* 99  BUN 16 7 5*  CREATININE 0.61 0.38* 0.36*  CALCIUM 9.0 8.6* 8.2*   GFR: Estimated Creatinine Clearance: 80.2 mL/min (A) (by C-G formula based on SCr of 0.36 mg/dL (L)). Liver Function Tests: Recent Labs  Lab 12/01/19 0132  AST 13*  ALT 11  ALKPHOS 84  BILITOT 1.5*  PROT 8.6*  ALBUMIN 4.1   Recent Labs  Lab 12/01/19 0132  LIPASE 20   No results for input(s): AMMONIA in the last 168 hours. Coagulation Profile: No results for input(s): INR, PROTIME in the last 168 hours. Cardiac Enzymes: No results for input(s): CKTOTAL, CKMB, CKMBINDEX, TROPONINI in the last 168 hours. BNP (last 3 results) No results for input(s): PROBNP in the last 8760 hours. HbA1C: No results for input(s): HGBA1C in the last 72 hours. CBG: No results for input(s): GLUCAP in the last 168 hours. Lipid Profile: No results for input(s): CHOL, HDL, LDLCALC, TRIG, CHOLHDL, LDLDIRECT in the last 72 hours. Thyroid Function Tests: No results for input(s): TSH, T4TOTAL, FREET4, T3FREE, THYROIDAB in the last 72 hours. Anemia Panel: Recent Labs    12/03/19 0448  VITAMINB12 457  FOLATE 9.9  FERRITIN 29  TIBC 195*  IRON 14*   Urine analysis:    Component  Value Date/Time   COLORURINE YELLOW (A) 12/01/2019 0540   APPEARANCEUR CLOUDY (A) 12/01/2019 0540   APPEARANCEUR Turbid 12/24/2013 0313   LABSPEC 1.033 (H) 12/01/2019 0540   LABSPEC 1.021 12/24/2013 0313   PHURINE 6.0 12/01/2019 0540   GLUCOSEU NEGATIVE 12/01/2019 0540   GLUCOSEU Negative 12/24/2013 0313   HGBUR MODERATE (A) 12/01/2019 0540   BILIRUBINUR NEGATIVE 12/01/2019 0540   BILIRUBINUR Negative 12/24/2013 0313   KETONESUR 80 (A) 12/01/2019 0540   PROTEINUR >=300 (A) 12/01/2019 0540   NITRITE NEGATIVE 12/01/2019 0540   LEUKOCYTESUR LARGE (A) 12/01/2019 0540   LEUKOCYTESUR 1+ 12/24/2013 0313   Sepsis Labs: @LABRCNTIP (procalcitonin:4,lacticidven:4)  ) Recent Results (from the past 240 hour(s))  SARS Coronavirus 2 by RT PCR (hospital order, performed in [facility] hospital lab) Nasopharyngeal Nasopharyngeal Swab     Status: None   Collection Time: 12/01/19  8:00 AM   Specimen: Nasopharyngeal Swab  Result Value Ref Range Status  SARS Coronavirus 2 NEGATIVE NEGATIVE Final    Comment: (NOTE) SARS-CoV-2 target nucleic acids are NOT DETECTED.  The SARS-CoV-2 RNA is generally detectable in upper and lower respiratory specimens during the acute phase of infection. The lowest concentration of SARS-CoV-2 viral copies this assay can detect is 250 copies / mL. A negative result does not preclude SARS-CoV-2 infection and should not be used as the sole basis for treatment or other patient management decisions.  A negative result may occur with improper specimen collection / handling, submission of specimen other than nasopharyngeal swab, presence of viral mutation(s) within the areas targeted by this assay, and inadequate number of viral copies (<250 copies / mL). A negative result must be combined with clinical observations, patient history, and epidemiological information.  Fact Sheet for Patients:   BoilerBrush.com.cy  Fact Sheet for Healthcare  Providers: https://pope.com/  This test is not yet approved or  cleared by the [place] FDA and has been authorized for detection and/or diagnosis of SARS-CoV-2 by FDA under an Emergency Use Authorization (EUA).  This EUA will remain in effect (meaning this test can be used) for the duration of the COVID-19 declaration under Section 564(b)(1) of the Act, 21 U.S.C. section 360bbb-3(b)(1), unless the authorization is terminated or revoked sooner.  Performed at [facility], [address], [place], [place] [address]   Urine Culture     Status: Abnormal   Collection Time: 12/02/19  2:47 PM   Specimen: Urine, Random  Result Value Ref Range Status   Specimen Description   Final    URINE, RANDOM Performed at [facility], [address], [place], [place] [address]    Special Requests   Final    NONE Performed at [facility], [address] [place], [place] [address]    Culture 10,000 COLONIES/mL ENTEROCOCCUS FAECALIS (A)  Final   Report Status 12/04/2019 FINAL  Final   Organism ID, Bacteria ENTEROCOCCUS FAECALIS (A)  Final      Susceptibility   Enterococcus faecalis - MIC*    AMPICILLIN <=2 SENSITIVE Sensitive     NITROFURANTOIN <=16 SENSITIVE Sensitive     VANCOMYCIN 1 SENSITIVE Sensitive     * 10,000 COLONIES/mL ENTEROCOCCUS FAECALIS      Studies: No results found.  Scheduled Meds: . amoxicillin  500 mg Oral Q8H  . ascorbic acid  500 mg Oral BID  . Chlorhexidine Gluconate Cloth  6 each Topical Daily  . enoxaparin (LOVENOX) injection  40 mg Subcutaneous Q24H  . feeding supplement (ENSURE ENLIVE)  237 mL Oral TID BM  . ferrous sulfate  325 mg Oral Q breakfast    Continuous Infusions:    LOS: 3 days     Briant Cedar, MD [facility]  If 7PM-7AM, please contact night-coverage [email or web address] 12/05/2019, 2:53 PM

## 2019-12-05 NOTE — Plan of Care (Signed)
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
## 2019-12-05 NOTE — Progress Notes (Signed)
Patient discharged home via POV. After Visit Summary reviewed with patient. Patient had no questions at this time.

## 2020-06-26 ENCOUNTER — Other Ambulatory Visit: Payer: Self-pay

## 2020-06-26 ENCOUNTER — Encounter: Payer: Self-pay | Admitting: Emergency Medicine

## 2020-06-26 ENCOUNTER — Emergency Department
Admission: EM | Admit: 2020-06-26 | Discharge: 2020-06-29 | Disposition: A | Discharge status: Home / Self Care | Payer: Medicare Other | Attending: Emergency Medicine | Admitting: Emergency Medicine

## 2020-06-26 ENCOUNTER — Emergency Department: Payer: Medicare Other

## 2020-06-26 DIAGNOSIS — Z20822 Contact with and (suspected) exposure to covid-19: Secondary | ICD-10-CM | POA: Insufficient documentation

## 2020-06-26 DIAGNOSIS — F4321 Adjustment disorder with depressed mood: Secondary | ICD-10-CM

## 2020-06-26 DIAGNOSIS — R1084 Generalized abdominal pain: Secondary | ICD-10-CM | POA: Insufficient documentation

## 2020-06-26 DIAGNOSIS — E876 Hypokalemia: Secondary | ICD-10-CM

## 2020-06-26 DIAGNOSIS — R112 Nausea with vomiting, unspecified: Secondary | ICD-10-CM | POA: Insufficient documentation

## 2020-06-26 DIAGNOSIS — T83511A Infection and inflammatory reaction due to indwelling urethral catheter, initial encounter: Secondary | ICD-10-CM | POA: Diagnosis not present

## 2020-06-26 DIAGNOSIS — Y69 Unspecified misadventure during surgical and medical care: Secondary | ICD-10-CM | POA: Insufficient documentation

## 2020-06-26 DIAGNOSIS — Z87891 Personal history of nicotine dependence: Secondary | ICD-10-CM | POA: Insufficient documentation

## 2020-06-26 DIAGNOSIS — L89104 Pressure ulcer of unspecified part of back, stage 4: Secondary | ICD-10-CM | POA: Insufficient documentation

## 2020-06-26 DIAGNOSIS — F419 Anxiety disorder, unspecified: Secondary | ICD-10-CM | POA: Diagnosis present

## 2020-06-26 DIAGNOSIS — L8990 Pressure ulcer of unspecified site, unspecified stage: Secondary | ICD-10-CM | POA: Diagnosis present

## 2020-06-26 DIAGNOSIS — F332 Major depressive disorder, recurrent severe without psychotic features: Secondary | ICD-10-CM | POA: Diagnosis present

## 2020-06-26 DIAGNOSIS — N390 Urinary tract infection, site not specified: Secondary | ICD-10-CM

## 2020-06-26 LAB — COMPREHENSIVE METABOLIC PANEL
ALT: 11 U/L (ref 0–44)
AST: 15 U/L (ref 15–41)
Albumin: 3.8 g/dL (ref 3.5–5.0)
Alkaline Phosphatase: 70 U/L (ref 38–126)
Anion gap: 12 (ref 5–15)
BUN: 17 mg/dL (ref 6–20)
CO2: 25 mmol/L (ref 22–32)
Calcium: 9.1 mg/dL (ref 8.9–10.3)
Chloride: 100 mmol/L (ref 98–111)
Creatinine, Ser: 0.5 mg/dL — ABNORMAL LOW (ref 0.61–1.24)
GFR, Estimated: >60 mL/min (ref >60)
Glucose, Bld: 114 mg/dL — ABNORMAL HIGH (ref 70–99)
Potassium: 3.1 mmol/L — ABNORMAL LOW (ref 3.5–5.1)
Sodium: 137 mmol/L (ref 135–145)
Total Bilirubin: 0.8 mg/dL (ref 0.3–1.2)
Total Protein: 8.2 g/dL — ABNORMAL HIGH (ref 6.5–8.1)

## 2020-06-26 LAB — URINALYSIS, COMPLETE (UACMP) WITH MICROSCOPIC
Bilirubin Urine: NEGATIVE
Glucose, UA: NEGATIVE mg/dL
Ketones, ur: 80 mg/dL — AB
Nitrite: NEGATIVE
Protein, ur: 100 mg/dL — AB
Specific Gravity, Urine: 1.027 (ref 1.005–1.030)
Squamous Epithelial / HPF: NONE SEEN (ref 0–5)
WBC, UA: >50 WBC/hpf — ABNORMAL HIGH (ref 0–5)
pH: 6 (ref 5.0–8.0)

## 2020-06-26 LAB — CBC
HCT: 40.1 % (ref 39.0–52.0)
Hemoglobin: 12.4 g/dL — ABNORMAL LOW (ref 13.0–17.0)
MCH: 22.3 pg — ABNORMAL LOW (ref 26.0–34.0)
MCHC: 30.9 g/dL (ref 30.0–36.0)
MCV: 72.1 fL — ABNORMAL LOW (ref 80.0–100.0)
Platelets: 409 10*3/uL — ABNORMAL HIGH (ref 150–400)
RBC: 5.56 MIL/uL (ref 4.22–5.81)
RDW: 17.3 % — ABNORMAL HIGH (ref 11.5–15.5)
WBC: 8.4 10*3/uL (ref 4.0–10.5)
nRBC: 0 % (ref 0.0–0.2)

## 2020-06-26 LAB — RESP PANEL BY RT-PCR (FLU A&B, COVID) ARPGX2
Influenza A by PCR: NEGATIVE
Influenza B by PCR: NEGATIVE
SARS Coronavirus 2 by RT PCR: NEGATIVE

## 2020-06-26 LAB — LIPASE, BLOOD: Lipase: 24 U/L (ref 11–51)

## 2020-06-26 MED ORDER — ONDANSETRON HCL 4 MG PO TABS
4.0000 mg | ORAL_TABLET | Freq: Four times a day (QID) | ORAL | Status: DC | PRN
  Administered 2020-06-27: 4 mg via ORAL
  Filled 2020-06-26: qty 1

## 2020-06-26 MED ORDER — ENSURE ENLIVE PO LIQD
237.0000 mL | Freq: Three times a day (TID) | ORAL | Status: DC
  Administered 2020-06-26 – 2020-06-27 (×2): 237 mL via ORAL

## 2020-06-26 MED ORDER — SODIUM CHLORIDE 0.9 % IV SOLN
1.0000 g | Freq: Once | INTRAVENOUS | Status: AC
  Administered 2020-06-26: 1 g via INTRAVENOUS
  Filled 2020-06-26: qty 10

## 2020-06-26 MED ORDER — ONDANSETRON 4 MG PO TBDP
4.0000 mg | ORAL_TABLET | Freq: Once | ORAL | Status: AC
  Administered 2020-06-26: 4 mg via ORAL

## 2020-06-26 MED ORDER — POTASSIUM CHLORIDE CRYS ER 20 MEQ PO TBCR
40.0000 meq | EXTENDED_RELEASE_TABLET | Freq: Once | ORAL | Status: AC
  Administered 2020-06-26: 40 meq via ORAL
  Filled 2020-06-26: qty 2

## 2020-06-26 MED ORDER — IOHEXOL 300 MG/ML  SOLN
75.0000 mL | Freq: Once | INTRAMUSCULAR | Status: AC | PRN
  Administered 2020-06-26: 75 mL via INTRAVENOUS
  Filled 2020-06-26: qty 75

## 2020-06-26 MED ORDER — SULFAMETHOXAZOLE-TRIMETHOPRIM 800-160 MG PO TABS
1.0000 | ORAL_TABLET | Freq: Two times a day (BID) | ORAL | Status: DC
  Administered 2020-06-27 (×2): 1 via ORAL
  Filled 2020-06-26 (×3): qty 1

## 2020-06-26 MED ORDER — LACTATED RINGERS IV BOLUS
1000.0000 mL | Freq: Once | INTRAVENOUS | Status: AC
  Administered 2020-06-26: 1000 mL via INTRAVENOUS

## 2020-06-26 MED ORDER — FENTANYL CITRATE (PF) 100 MCG/2ML IJ SOLN
50.0000 ug | Freq: Once | INTRAMUSCULAR | Status: AC
Start: 2020-06-26 — End: 2020-06-26
  Administered 2020-06-26: 50 ug via INTRAVENOUS
  Filled 2020-06-26: qty 2

## 2020-06-26 MED ORDER — ONDANSETRON 4 MG PO TBDP
ORAL_TABLET | ORAL | Status: AC
  Filled 2020-06-26: qty 1

## 2020-06-26 MED ORDER — IBUPROFEN 400 MG PO TABS: 400.0000 mg | ORAL_TABLET | Freq: Four times a day (QID) | ORAL | Status: DC | PRN

## 2020-06-26 NOTE — BH Assessment (Signed)
Patient has been Psyc Cleared by Psyc NP Elenore Paddy, it is recommended that patient receives a Social Work consult for potential SNF placement. No TTS consult needed at this time

## 2020-06-26 NOTE — ED Notes (Addendum)
Pt states coming in with abdominal pain for the last 3 days. Pt states he has pressure sores that could be the cause. Pt also states he thinks he is dehydrated. Pts pressure sore on lower back noted to have dressing. Area is several inches in length. Pt states he would like to get into an assisted living facility, as he cannot take care of them himself anymore.

## 2020-06-26 NOTE — ED Provider Notes (Signed)
[facility] Emergency Department Provider Note  ____________________________________________   Event Date/Time   First MD Initiated Contact with Patient 06/26/20 1839     (approximate)  I have reviewed the triage vital signs and the nursing notes.   HISTORY  Chief Complaint Abdominal Pain and Nausea   HPI Jonathon York is a 45 y.o. male with medical history significant forparaplegia, chronic osteomyelitis, chronic decubitus ulcers, neurogenic bladder evaluation of nausea, vomiting and abdominal discomfort.  Patient states the symptoms have been getting worse over the last 3 days.  States he is currently living at home.  He gets around using a wheelchair and is typically able to do his ADLs although he feels this is a recurring problem and is worried that his sacral wounds are not healing.  He states he wishes to be placed in nursing home.  He denies any headache, earache, sore throat, chest pain, cough, shortness of breath, blood in stool or urine or any recent falls or injuries or other acute sick symptoms.  States he thinks he is passing gas and last had a bowel movement yesterday.  States he also has been feeling more depressed over the last several weeks which is he "just die and ended all".  He denies ever being treated for depression does not have a psychiatrist.  Denies any illegal drug use or regular EtOH use.         Past Medical History:  Diagnosis Date  . Anxiety   . Decubitus ulcer   . Depression   . Neurogenic bladder   . Osteomyelitis (HCC)   . 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 UTI (urinary tract infection) 12/02/2019  . UTI (urinary tract infection) due to urinary indwelling catheter (HCC) 12/01/2019  . Infected decubitus ulcer 10/27/2019  . Chronic indwelling Foley catheter 10/27/2019  . Bilateral leg edema 10/27/2019  . 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  Constipation   . Iron deficiency anemia   . Sacral decubitus ulcer, stage IV (HCC) 07/13/2019  . Intractable pain 11/26/2018  . Anxiety 04/22/2018  . Acute osteomyelitis of right foot (HCC) 03/18/2018  . Pressure injury of skin 03/18/2018  . 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 Protein-calorie malnutrition, severe 11/20/2016  . Herpes simplex infection of penis   . Decubitus ulcer 11/19/2016  . 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 Decubitus ulcer of sacral region   . Sepsis (HCC) 04/15/2016  . 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 Pressure ulcer stage III 04/30/2015  . Sterile pyuria 04/30/2015  . Paraplegia (HCC) 04/30/2015  . Neurogenic bladder 04/30/2015  . 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 Moderate malnutrition (HCC) 04/28/2015    Past Surgical History:  Procedure Laterality Date  . AMPUTATION Right 03/19/2018   Procedure: 5th Metatarsal Resection;  Surgeon: Recardo Evangelist, DPM;  Location: [facility] ORS;  Service: Podiatry;  Laterality: Right;  . 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 IRRIGATION AND DEBRIDEMENT BUTTOCKS    . TOE AMPUTATION Right     Prior to Admission medications   Medication Sig Start Date End Date Taking? Authorizing Provider  feeding supplement, ENSURE ENLIVE, (ENSURE ENLIVE) LIQD Take 237 mLs by mouth 3 (three) times daily between meals. 12/05/19  Briant Cedar, MD  ferrous sulfate 325 (65 FE) MG tablet Take 1 tablet (325 mg total) by mouth daily with breakfast. 12/05/19 01/04/20  Briant Cedar, MD  HYDROcodone-acetaminophen (NORCO/VICODIN) 5-325 MG tablet Take 1 tablet by mouth every 6 (six) hours as needed for moderate pain. Patient not taking: Reported on 12/01/2019 10/28/19   Enedina Finner, MD  ibuprofen (ADVIL) 200 MG tablet Take by mouth.     [provider]  ondansetron (ZOFRAN) 4 MG tablet Take 4 mg by mouth every 6 (six) hours as needed for nausea or vomiting. Patient not taking: Reported on 12/01/2019    [provider]  sodium hypochlorite (DAKIN'S 1/4 STRENGTH) 0.125 % SOLN Irrigate with 1 application as directed every morning. 12/05/19   Briant Cedar, MD    Allergies Orange fruit [citrus]  Family History  Problem Relation Age of Onset  . Lymphoma Sister     Social History Social History   Tobacco Use  . Smoking status: Former Smoker    Packs/day: 0.50    Types: Cigarettes  . Smokeless tobacco: Never Used  Substance Use Topics  . Alcohol use: No  . Drug use: Yes    Types: Marijuana    Comment: 2 days ago    Review of Systems  Review of Systems  Constitutional: Negative for chills and fever.  HENT: Negative for sore throat.   Eyes: Negative for pain.  Respiratory: Negative for cough and stridor.   Cardiovascular: Negative for chest pain.  Gastrointestinal: Positive for abdominal pain, nausea and vomiting.  Skin: Negative for rash.  Neurological: Positive for weakness (chronic below t12/umbilicus). Negative for seizures, loss of consciousness and headaches.  Psychiatric/Behavioral: Negative for suicidal ideas.  All other systems reviewed and are negative.     ____________________________________________   PHYSICAL EXAM:  VITAL SIGNS: ED Triage Vitals  Enc Vitals Group     BP 06/26/20 1818 107/73     Pulse Rate 06/26/20 1818 85     Resp 06/26/20 1818 18     Temp 06/26/20 1818 98.2 F (36.8 C)     Temp Source 06/26/20 1818 Oral     SpO2 06/26/20 1818 100 %     Weight 06/26/20 1819 98 lb (44.5 kg)     Height 06/26/20 1819 5\' 10"  (1.778 m)     Head Circumference --      Peak Flow --      Pain Score 06/26/20 1819 8     Pain Loc --      Pain Edu? --      Excl. in GC? --    Vitals:   06/26/20 1818 06/26/20 1819  BP: 107/73 107/73  Pulse: 85 78  Resp: 18 18  Temp:  98.2 F (36.8 C) 98.2 F (36.8 C)  SpO2: 100%    Physical Exam Vitals and nursing note reviewed.  Constitutional:      Appearance: He is well-developed and well-nourished.  HENT:     Head: Normocephalic and atraumatic.     Right Ear: External ear normal.     Left Ear: External ear normal.  Eyes:     Conjunctiva/sclera: Conjunctivae normal.  Cardiovascular:     Rate and Rhythm: Normal rate and regular rhythm.     Heart sounds: No murmur heard.   Pulmonary:     Effort: Pulmonary effort is normal. No respiratory distress.     Breath sounds: Normal breath sounds.  Abdominal:     Palpations: Abdomen is soft.  Tenderness: There is no abdominal tenderness.  Musculoskeletal:        General: No edema.     Cervical back: Neck supple.  Skin:    General: Skin is warm and dry.     Capillary Refill: Capillary refill takes less than 2 seconds.  Neurological:     Mental Status: He is alert and oriented to person, place, and time.  Psychiatric:        Mood and Affect: Mood is depressed.        Thought Content: Thought content includes suicidal ideation.     Patient has several sacral wounds that appear to have healthy pink tissue without any purulence or active bleeding. ____________________________________________   LABS (all labs ordered are listed, but only abnormal results are displayed)  Labs Reviewed  COMPREHENSIVE METABOLIC PANEL - Abnormal; Notable for the following components:      Result Value   Potassium 3.1 (*)    Glucose, Bld 114 (*)    Creatinine, Ser 0.50 (*)    Total Protein 8.2 (*)    All other components within normal limits  CBC - Abnormal; Notable for the following components:   Hemoglobin 12.4 (*)    MCV 72.1 (*)    MCH 22.3 (*)    RDW 17.3 (*)    Platelets 409 (*)    All other components within normal limits  URINALYSIS, COMPLETE (UACMP) WITH MICROSCOPIC - Abnormal; Notable for the following components:   Color, Urine YELLOW (*)    APPearance  TURBID (*)    Hgb urine dipstick SMALL (*)    Ketones, ur 80 (*)    Protein, ur 100 (*)    Leukocytes,Ua LARGE (*)    WBC, UA >50 (*)    Bacteria, UA FEW (*)    All other components within normal limits  URINE CULTURE  RESP PANEL BY RT-PCR (FLU A&B, COVID) ARPGX2  LIPASE, BLOOD   ____________________________________________  EKG  ____________________________________________  RADIOLOGY  ED MD interpretation: Evidence of cystitis with thickened urinary bladder.  No evidence of appendicitis, diverticulitis, SBO or other clear acute abdominal pelvic process. No evidence of acute osteomyelitis.  Official radiology report(s): CT ABDOMEN PELVIS W CONTRAST  Result Date: 06/26/2020 CLINICAL DATA:  Abdominal pain, non localized and acute. Nausea, vomiting. Paraplegic. EXAM: CT ABDOMEN AND PELVIS WITH CONTRAST TECHNIQUE: Multidetector CT imaging of the abdomen and pelvis was performed using the standard protocol following bolus administration of intravenous contrast. CONTRAST:  75mL OMNIPAQUE IOHEXOL 300 MG/ML  SOLN COMPARISON:  12/01/2019 FINDINGS: Lower chest: No acute abnormality. Hepatobiliary: Small low-attenuation within the RIGHT hepatic lobe measures 4 millimeters and is too small fully characterize, stable compared to prior. Gallbladder is present. Pancreas: Unremarkable. No pancreatic ductal dilatation or surrounding inflammatory changes. Spleen: Normal in size without focal abnormality. Adrenals/Urinary Tract: Adrenal glands are unremarkable./stable LEFT renal cyst, largest 1.5 centimeters. Small RIGHT parapelvic cysts. No hydronephrosis. Ureters are unremarkable. A Foley catheter decompresses the urinary bladder. The bladder wall is thickened, measuring up to 1.1 centimeters. Stomach/Bowel: Stomach and small bowel loops are normal in appearance. The appendix is well seen and has a normal appearance. There are redundant, stool-filled loops of colon. Vascular/Lymphatic: There is dense  atherosclerotic calcification of the abdominal aorta. No associated aneurysm. No retroperitoneal or mesenteric adenopathy. Reproductive: Prostate is unremarkable. Other: There is soft tissue thickening posterior to the LOWER sacrum, consistent with history of decubitus ulcers. Musculoskeletal: Spinal fixation rods are in place, extending to T12 and L1. There is 2  millimeters retrolisthesis of L1 on L2 IMPRESSION: 1. Thickened urinary bladder wall, consistent with cystitis. Foley catheter decompresses the urinary bladder. 2. Normal appendix. 3. Stool-filled loops of redundant colon. 4. Thoracic spinal fixation. 5. Aortic Atherosclerosis (ICD10-I70.0). Electronically Signed   By: Norva Pavlov M.D.   On: 06/26/2020 19:57    ____________________________________________   PROCEDURES  Procedure(s) performed (including Critical Care):  .1-3 Lead EKG Interpretation Performed by: Gilles Chiquito, MD Authorized by: Gilles Chiquito, MD     Interpretation: normal     ECG rate assessment: normal     Rhythm: sinus rhythm     Ectopy: none     Conduction: normal       ____________________________________________   INITIAL IMPRESSION / ASSESSMENT AND PLAN / ED COURSE      Patient presents with above-stated history exam for assessment of several days of abdominal pain associate with some nonbloody nonbilious emesis.  He also endorses some depression and vague suicidal thoughts and states he thinks he should be in a nursing home as last time he was hospitalized it was recommended he be discharged to nursing home but he did not want to at that time and not change his mind.  States he has a lot of difficulty taking care of himself at home as he has almost no help and feels he cannot adequately take weight off his stage IV decubitus wounds and feels are not healing.  With regard to his abdominal pain concern for likely acute cystitis.  This is evidenced by bladder thickening on CT.  No evidence of  appendicitis, diverticulitis, SBO, cholecystitis or pancreatitis.  Patient does not appear septic.  In addition lipase of 24 not consistent with acute pancreatitis.  CMP shows a K of 3.1 but no evidence of hepatitis or cholestasis.  CBC shows no leukocytosis and hemoglobin of 12.4 compared to 8.96 months ago.  Patient treated with Rocephin and 1 L of IV fluids as well as Zofran.  Urine culture sent.  Patient sacral decubitus wounds do not appear acutely infected.  Patient seen by psych NP who did not recommend psychiatric hospitalization and this does seem reasonable and seems patient's suicidality is largely situational based on him feeling like he cannot adequately take care of himself at home.  Psychiatry did recommend social work consult for SNF placement.  I think this is reasonable.  PT OT consults placed.  Transition of care consulted.  Patient will be started on Bactrim tomorrow for a 10-day course for complicated cystitis.      ____________________________________________   FINAL CLINICAL IMPRESSION(S) / ED DIAGNOSES  Final diagnoses:  Generalized abdominal pain  Nausea and vomiting, intractability of vomiting not specified, unspecified vomiting type  Decubitus ulcer of back, stage 4 (HCC)  Hypokalemia    Medications  ibuprofen (ADVIL) tablet 400 mg (has no administration in time range)  feeding supplement (ENSURE ENLIVE / ENSURE PLUS) liquid 237 mL (has no administration in time range)  ondansetron (ZOFRAN) tablet 4 mg (has no administration in time range)  sulfamethoxazole-trimethoprim (BACTRIM DS) 800-160 MG per tablet 1 tablet (has no administration in time range)  ondansetron (ZOFRAN-ODT) disintegrating tablet 4 mg (4 mg Oral Given 06/26/20 1825)  fentaNYL (SUBLIMAZE) injection 50 mcg (50 mcg Intravenous Given 06/26/20 1908)  iohexol (OMNIPAQUE) 300 MG/ML solution 75 mL (75 mLs Intravenous Contrast Given 06/26/20 1932)  cefTRIAXone (ROCEPHIN) 1 g in sodium chloride 0.9 % 100  mL IVPB (0 g Intravenous Stopped 06/26/20 2052)  lactated ringers bolus 1,000  mL (1,000 mLs Intravenous New Bag/Given 06/26/20 1954)  potassium chloride SA (KLOR-CON) CR tablet 40 mEq (40 mEq Oral Given 06/26/20 1957)     ED Discharge Orders    None       Note:  This document was prepared using Dragon voice recognition software and may include unintentional dictation errors.   Gilles Chiquito, MD 06/26/20 2219

## 2020-06-26 NOTE — ED Notes (Signed)
This RN and Kory, RN changed dressing on pt wounds. This RN will speak to MD about wound consult.

## 2020-06-26 NOTE — ED Triage Notes (Signed)
Pt comes into the ED via EMS from home with c/o abd pain with N/V x3 days  98.3 124/81 100%RA 91HR [id number]

## 2020-06-26 NOTE — ED Notes (Signed)
Lt green, purple, and gray top sent to lab at this time.

## 2020-06-26 NOTE — ED Triage Notes (Addendum)
Pt via EMS from home. Pt c/o generalized abdominal pain, nausea and vomiting. Denies diarrhea. Pt states he has been sweating but afebrile on arrival. Pt is bedbound and states he does have some sacral wounds, pt is a paraplegic. Pt is A&Ox4 and NAD.

## 2020-06-27 DIAGNOSIS — F4321 Adjustment disorder with depressed mood: Secondary | ICD-10-CM | POA: Diagnosis not present

## 2020-06-27 DIAGNOSIS — R1084 Generalized abdominal pain: Secondary | ICD-10-CM | POA: Diagnosis not present

## 2020-06-27 MED ORDER — OXYCODONE-ACETAMINOPHEN 5-325 MG PO TABS
2.0000 | ORAL_TABLET | ORAL | Status: DC | PRN
  Administered 2020-06-27 (×2): 2 via ORAL
  Filled 2020-06-27 (×2): qty 2

## 2020-06-27 NOTE — ED Notes (Signed)
Called dietary requesting ensure.

## 2020-06-27 NOTE — ED Notes (Signed)
Pt alert  meds given  Iv in place.

## 2020-06-27 NOTE — ED Notes (Signed)
OT at bedside. Pt sat up to eat in bed. Denies further needs at this time.

## 2020-06-27 NOTE — Consult Note (Addendum)
WOC Nurse Consult Note: Reason for Consult: Consult requested for sacrum and bilat ischium.  Pt is familiar to [facility] team from previous admission, refer to previous progress notes on 8/4.  Pt has not had any assistance at home for wound care and he has to change his own dressings.  He had chronic osteomyelitis in the past is paralyzed and unable to visualize the affected areas.  He has been followed by the outpatient wound care center and was awaiting a consult with urology at [facility] for some type of procedure.  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  Wound type: Sacrum chronic stage 4 pressure injury; 4X6X.2 cm, 100% red and moist, mod amt green drainage and odor, bone palpable Left ischium with chronic stage 4 pressure injury; 6X6X.3 cm, 100% red and moist, mod amt green drainage and odor, bone palpable Right ischium chronic stage 4 pressure injury; 3X8X.5 cm, 100% red and moist, mod amt green drainage and odor, bone palpable Pressure Injury POA: Yes Dressing procedure/placement/frequency: Pt could benefit from placement after discharge to facilitate healing of his wounds.  Air mattress to reduce pressure.  Topical treatment orders provided for bedside nurses to perform as follows to absorb drainage and provide antimicrobial benefits: Apply Aquacel [place] [phone]) to sacrum and bilat ischium wounds Q day, then cover with foam dressing. (Change foam dressing Q 3 days or PRN soiling.) Please re-consult if further assistance is needed.  Thank-you,  Cammie Mcgee MSN, RN, CWOCN, [place], CNS [phone]
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
## 2020-06-27 NOTE — ED Notes (Signed)
Psychiatrist at bedside

## 2020-06-27 NOTE — ED Notes (Signed)
Pt given lunch. PT at bedside.

## 2020-06-27 NOTE — Consult Note (Signed)
[facility] Consult   Reason for Consult: Abdominal Pain and Nausea Referring Physician:  Dr. Katrinka BlazingSmith [name] MRN:  [id number] Principal Diagnosis: <principal problem not specified> Diagnosis:  Active Problems:   Severe recurrent major depression without psychotic features (HCC)   Decubitus ulcer   Anxiety   Total Time spent with patient: 30 minutes  Subjective:" I need help with my wounds" Jonathon York is a 45 y.o. male patient [facility] voluntarily.  Per the ED triage nurse note, Pt comes into the ED via EMS from home with c/o abd pain with N/V x3 days. The patient voiced he needs assistance with caring for his back and buttocks wounds. The patient states he does not receive any nursing care, and his wounds are terrible. He states he has to do all of his wound care, and he needs to go into a facility where he will receive wound care daily. The patient was seen face-to-face by this provider; the chart was reviewed and consulted with Dr.Smith on 06/26/2020 due to the patient's care. It was discussed with the EDP that the patient does not meet the criteria to be admitted to the psychiatric inpatient unit.  On evaluation, the patient is alert and oriented x4, calm, cooperative, and mood-congruent with affect.  The patient does not appear to be responding to internal or external stimuli. Neither is the patient presenting with any delusional thinking. The patient denies auditory or visual hallucinations. The patient denies any suicidal, homicidal, or self-harm ideations. The patient is not presenting with any psychotic or paranoid behaviors. During an encounter with the patient, he answered questions appropriately. HPI: Per Dr. Katrinka BlazingSmith, Jonathon York is a 45 y.o. male with medical history significant forparaplegia, chronic osteomyelitis, chronic decubitus ulcers, neurogenic bladder evaluation of nausea, vomiting and abdominal  discomfort.  Patient states the symptoms have been getting worse over the last 3 days.  States he is currently living at home.  He gets around using a wheelchair and is typically able to do his ADLs although he feels this is a recurring problem and is worried that his sacral wounds are not healing.  He states he wishes to be placed in nursing home.  He denies any headache, earache, sore throat, chest pain, cough, shortness of breath, blood in stool or urine or any recent falls or injuries or other acute sick symptoms.  States he thinks he is passing gas and last had a bowel movement yesterday.  States he also has been feeling more depressed over the last several weeks which is he "just die and ended all".  He denies ever being treated for depression does not have a psychiatrist.  Denies any illegal drug use or regular EtOH use.  Past Psychiatric History: Anxiety Depression Risk to Self:  No Risk to Others:  No Prior Inpatient Therapy:   Yes Prior Outpatient Therapy:  Yes  Past Medical History:  Past Medical History:  Diagnosis Date  . Anxiety   . Decubitus ulcer   . Depression   . Neurogenic bladder   . Osteomyelitis (HCC)   . 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 AMPUTATION Right 03/19/2018   Procedure: 5th Metatarsal Resection;  Surgeon: Recardo Evangelistroxler, Matthew, DPM;  Location: [facility] ORS;  Service: Podiatry;  Laterality: Right;  . AMPUTATION TOE Right 04/29/2015   Procedure: AMPUTATION TOE;  Surgeon: Linus Galasodd Cline, MD;  Location: [facility] ORS;  Service: Podiatry;  Laterality: Right;  . IRRIGATION AND DEBRIDEMENT BUTTOCKS    . TOE AMPUTATION Right    Family History:  Family History  Problem Relation Age of Onset  . Lymphoma Sister    Family Psychiatric  History:  Social History:  Social History   Substance and Sexual Activity  Alcohol Use No     Social History   Substance and Sexual Activity  Drug Use Yes  . Types:  Marijuana   Comment: 2 days ago    Social History   Socioeconomic History  . Marital status: Single    Spouse name: Not on file  . Number of children: Not on file  . Years of education: Not on file  . Highest education level: Not on file  Occupational History  . Not on file  Tobacco Use  . Smoking status: Former Smoker    Packs/day: 0.50    Types: Cigarettes  . Smokeless tobacco: Never Used  Substance and Sexual Activity  . Alcohol use: No  . Drug use: Yes    Types: Marijuana    Comment: 2 days ago  . Sexual activity: Never  Other Topics Concern  . Not on file  Social History Narrative  . Not on file   Social Determinants of Health   Financial Resource Strain: Not on file  Food Insecurity: Not on file  Transportation Needs: Not on file  Physical Activity: Not on file  Stress: Not on file  Social Connections: Not on file   Additional Social History:    Allergies:   Allergies  Allergen Reactions  . Orange Fruit [Citrus] Other (See Comments)    Blisters only from ORANGES!!! Patient is not allergic from all citrus    Labs:  Results for orders placed or performed during the hospital encounter of 06/26/20 (from the past 48 hour(s))  Lipase, blood     Status: None   Collection Time: 06/26/20  6:23 PM  Result Value Ref Range   Lipase 24 11 - 51 U/L    Comment: Performed at [facility], [address] [place], [place] [address]  Comprehensive metabolic panel     Status: Abnormal   Collection Time: 06/26/20  6:23 PM  Result Value Ref Range   Sodium 137 135 - 145 mmol/L   Potassium 3.1 (L) 3.5 - 5.1 mmol/L   Chloride 100 98 - 111 mmol/L   CO2 25 22 - 32 mmol/L   Glucose, Bld 114 (H) 70 - 99 mg/dL    Comment: Glucose reference range applies only to samples taken after fasting for at least 8 hours.   BUN 17 6 - 20 mg/dL   Creatinine, Ser 3.55 (L) 0.61 - 1.24 mg/dL   Calcium 9.1 8.9 - 97.4 mg/dL   Total Protein 8.2 (H) 6.5 - 8.1 g/dL   Albumin 3.8 3.5  - 5.0 g/dL   AST 15 15 - 41 U/L   ALT 11 0 - 44 U/L   Alkaline Phosphatase 70 38 - 126 U/L   Total Bilirubin 0.8 0.3 - 1.2 mg/dL   GFR, Estimated >16 >38 mL/min    Comment: (NOTE) Calculated using the CKD-EPI Creatinine Equation (2021)    Anion gap 12 5 - 15    Comment: Performed at [facility], [address] [place], [place] [address]  CBC     Status: Abnormal   Collection Time: 06/26/20  6:23 PM  Result Value Ref Range   WBC 8.4 4.0 - 10.5 K/uL  RBC 5.56 4.22 - 5.81 MIL/uL   Hemoglobin 12.4 (L) 13.0 - 17.0 g/dL   HCT 16.1 09.6 - 04.5 %   MCV 72.1 (L) 80.0 - 100.0 fL   MCH 22.3 (L) 26.0 - 34.0 pg   MCHC 30.9 30.0 - 36.0 g/dL   RDW 40.9 (H) 81.1 - 91.4 %   Platelets 409 (H) 150 - 400 K/uL   nRBC 0.0 0.0 - 0.2 %    Comment: Performed at [facility], [address] [place], [place] [address]  Urinalysis, Complete w Microscopic     Status: Abnormal   Collection Time: 06/26/20  7:09 PM  Result Value Ref Range   Color, Urine YELLOW (A) YELLOW   APPearance TURBID (A) CLEAR   Specific Gravity, Urine 1.027 1.005 - 1.030   pH 6.0 5.0 - 8.0   Glucose, UA NEGATIVE NEGATIVE mg/dL   Hgb urine dipstick SMALL (A) NEGATIVE   Bilirubin Urine NEGATIVE NEGATIVE   Ketones, ur 80 (A) NEGATIVE mg/dL   Protein, ur 621 (A) NEGATIVE mg/dL   Nitrite NEGATIVE NEGATIVE   Leukocytes,Ua LARGE (A) NEGATIVE   RBC / HPF 21-50 0 - 5 RBC/hpf   WBC, UA >50 (H) 0 - 5 WBC/hpf   Bacteria, UA FEW (A) NONE SEEN   Squamous Epithelial / LPF NONE SEEN 0 - 5   WBC Clumps PRESENT    Mucus PRESENT    Hyaline Casts, UA PRESENT     Comment: Performed at [facility], [address] [place], [place] [address]  Resp Panel by RT-PCR (Flu A&B, Covid) Nasopharyngeal Swab     Status: None   Collection Time: 06/26/20  9:58 PM   Specimen: Nasopharyngeal Swab; Nasopharyngeal(NP) swabs in vial transport medium  Result Value Ref Range   SARS Coronavirus 2 by RT PCR NEGATIVE NEGATIVE     Comment: (NOTE) SARS-CoV-2 target nucleic acids are NOT DETECTED.  The SARS-CoV-2 RNA is generally detectable in upper respiratory specimens during the acute phase of infection. The lowest concentration of SARS-CoV-2 viral copies this assay can detect is 138 copies/mL. A negative result does not preclude SARS-Cov-2 infection and should not be used as the sole basis for treatment or other patient management decisions. A negative result may occur with  improper specimen collection/handling, submission of specimen other than nasopharyngeal swab, presence of viral mutation(s) within the areas targeted by this assay, and inadequate number of viral copies(<138 copies/mL). A negative result must be combined with clinical observations, patient history, and epidemiological information. The expected result is Negative.  Fact Sheet for Patients:  BloggerCourse.com  Fact Sheet for Healthcare Providers:  SeriousBroker.it  This test is no t yet approved or cleared by the [place] FDA and  has been authorized for detection and/or diagnosis of SARS-CoV-2 by FDA under an Emergency Use Authorization (EUA). This EUA will remain  in effect (meaning this test can be used) for the duration of the COVID-19 declaration under Section 564(b)(1) of the Act, 21 U.S.C.section 360bbb-3(b)(1), unless the authorization is terminated  or revoked sooner.       Influenza A by PCR NEGATIVE NEGATIVE   Influenza B by PCR NEGATIVE NEGATIVE    Comment: (NOTE) The Xpert Xpress SARS-CoV-2/FLU/RSV plus assay is intended as an aid in the diagnosis of influenza from Nasopharyngeal swab specimens and should not be used as a sole basis for treatment. Nasal washings and aspirates are unacceptable for Xpert Xpress SARS-CoV-2/FLU/RSV testing.  Fact Sheet for Patients: BloggerCourse.com  Fact Sheet  for Healthcare  Providers: SeriousBroker.it  This test is not yet approved or cleared by the [place] and has been authorized for detection and/or diagnosis of SARS-CoV-2 by FDA under an Emergency Use Authorization (EUA). This EUA will remain in effect (meaning this test can be used) for the duration of the COVID-19 declaration under Section 564(b)(1) of the Act, 21 U.S.C. section 360bbb-3(b)(1), unless the authorization is terminated or revoked.  Performed at [facility], [address] [place], [place] [address]     Current Facility-Administered Medications  Medication Dose Route Frequency Provider Last Rate Last Admin  . feeding supplement (ENSURE ENLIVE / ENSURE PLUS) liquid 237 mL  237 mL Oral TID BM Gilles Chiquito, MD   237 mL at 06/26/20 2252  . ibuprofen (ADVIL) tablet 400 mg  400 mg Oral Q6H PRN Gilles Chiquito, MD      . ondansetron [facility]) tablet 4 mg  4 mg Oral Q6H PRN Gilles Chiquito, MD      . oxyCODONE-acetaminophen (PERCOCET/ROXICET) 5-325 MG per tablet 2 tablet  2 tablet Oral Q4H PRN Loleta Rose, MD   2 tablet at 06/27/20 0057  . sulfamethoxazole-trimethoprim (BACTRIM DS) 800-160 MG per tablet 1 tablet  1 tablet Oral Q12H Gilles Chiquito, MD       Current Outpatient Medications  Medication Sig Dispense Refill  . feeding supplement, ENSURE ENLIVE, (ENSURE ENLIVE) LIQD Take 237 mLs by mouth 3 (three) times daily between meals. 237 mL 12  . ferrous sulfate 325 (65 FE) MG tablet Take 1 tablet (325 mg total) by mouth daily with breakfast. (Patient not taking: Reported on 06/27/2020) 30 tablet 0  . HYDROcodone-acetaminophen (NORCO/VICODIN) 5-325 MG tablet Take 1 tablet by mouth every 6 (six) hours as needed for moderate pain. (Patient not taking: No sig reported) 20 tablet 0  . ibuprofen (ADVIL) 200 MG tablet Take by mouth. (Patient not taking: Reported on 06/27/2020)    . ondansetron (ZOFRAN) 4 MG tablet Take 4 mg by mouth every 6 (six)  hours as needed for nausea or vomiting. (Patient not taking: No sig reported)    . sodium hypochlorite (DAKIN'S 1/4 STRENGTH) 0.125 % SOLN Irrigate with 1 application as directed every morning. (Patient not taking: Reported on 06/27/2020) 473 mL 0    Musculoskeletal: Strength & Muscle Tone: decreased and atrophy Gait & Station: unable to stand Patient leans: Backward  Psychiatric Specialty Exam: Physical Exam Vitals and nursing note reviewed.  Constitutional:      General: He is in acute distress.     Appearance: He is normal weight.  HENT:     Mouth/Throat:     Mouth: Mucous membranes are moist.  Eyes:     Extraocular Movements: Extraocular movements intact.  Cardiovascular:     Rate and Rhythm: Normal rate.  Pulmonary:     Effort: Pulmonary effort is normal.  Neurological:     Mental Status: He is alert and oriented to person, place, and time.  Psychiatric:        Attention and Perception: Attention and perception normal.        Mood and Affect: Mood is anxious and depressed. Affect is flat.        Speech: Speech normal.        Behavior: Behavior is withdrawn. Behavior is cooperative.        Cognition and Memory: Cognition and memory normal.     Review of Systems  Psychiatric/Behavioral: The patient is nervous/anxious.   All  other systems reviewed and are negative.   Blood pressure 109/69, pulse 92, temperature 98.2 F (36.8 C), temperature source Oral, resp. rate 18, height 5\' 10"  (1.778 m), weight 44.5 kg, SpO2 97 %.Body mass index is 14.06 kg/m.  General Appearance: Casual and Disheveled  Eye Contact:  Fair  Speech:  Clear and Coherent  Volume:  Normal  Mood:  Anxious, Depressed and Hopeless  Affect:  Blunt and Congruent  Thought Process:  Coherent  Orientation:  Full (Time, Place, and Person)  Thought Content:  Logical, Rumination and Tangential  Suicidal Thoughts:  No  Homicidal Thoughts:  No  Memory:  Immediate;   Good Recent;   Good Remote;   Good   Judgement:  Fair  Insight:  Fair  Psychomotor Activity:  Normal  Concentration:  Concentration: Good  Recall:  Good  Fund of Knowledge:  Good  Language:  Good  Akathisia:  Negative  Handed:  Right  AIMS (if indicated):     Assets:  Communication Skills Desire for Improvement Physical Health Resilience Social Support  ADL's:  Intact  Cognition:  WNL  Sleep:        Treatment Plan Summary: Plan The patient is not a safety risk to himself or others and does not require psychiatric inpatient admission for stabilization and treatment.  Disposition: No evidence of imminent risk to self or others at present.   Patient does not meet criteria for psychiatric inpatient admission. Supportive therapy provided about ongoing stressors. Discussed crisis plan, support from social network, calling 911, coming to the Emergency Department, and calling Suicide Hotline.  , NP 06/27/2020 1:34 AM

## 2020-06-27 NOTE — ED Notes (Signed)
Pt requesting lights off and door shut.

## 2020-06-27 NOTE — ED Notes (Signed)
Pt reports wounds assessed and dressed by wound nurse this AM during wound consult.

## 2020-06-27 NOTE — ED Notes (Signed)
Pt sleeping. 

## 2020-06-27 NOTE — ED Notes (Signed)
Resumed care from [facility] rn.  Pt alert.  Pt lying in hospital bed.  Pt waiting on placement.

## 2020-06-27 NOTE — Consult Note (Signed)
[facility] Face-to-Face Psychiatry Consult   Reason for Consult: Follow-up consult 46 year old man with a history of paraplegia and chronic nonhealing skin ulcers also concerned about depression Referring Physician:  Vicente Males [name] MRN:  [id number] Principal Diagnosis: <principal problem not specified> Diagnosis:  Active Problems:   Severe recurrent major depression without psychotic features (HCC)   Decubitus ulcer   Anxiety   Total Time spent with patient: 30 minutes  Subjective:   Jonathon York is a 45 y.o. male patient admitted with "I feel depressed".  HPI: Patient seen chart reviewed.  Note from yesterday reviewed.  Patient continues to say that he feels depressed and down and negative about his ulcers.  He says he has very little activity most days.  Gets out of bed to take care of the house and get himself food but otherwise does not do very much.  Starting to feel more hopeless about it.  At the same time he denies any suicidal thought at all and says that he does have positive things in his life that are worth living for.  Patient states several times that he has no interest in taking any medication for depression.  He has been cooperative with work-up and evaluation in the emergency room  Past Psychiatric History: History of complaints of depression since being a paraplegic.  He tells me that many years ago he was prescribed antidepressants which caused him side effects that were intolerable and never helped with his mood and as a result he does not want to take any medicine anymore.  No history of suicide attempts or hospitalization  Risk to Self:   Risk to Others:   Prior Inpatient Therapy:   Prior Outpatient Therapy:    Past Medical History:  Past Medical History:  Diagnosis Date  . Anxiety   . Decubitus ulcer   . Depression   . Neurogenic bladder   . Osteomyelitis (HCC)   . Paraparesis of both lower limbs (HCC) 02/21/00    Past Surgical  History:  Procedure Laterality Date  . AMPUTATION Right 03/19/2018   Procedure: 5th Metatarsal Resection;  Surgeon: Recardo Evangelist, DPM;  Location: [facility] ORS;  Service: Podiatry;  Laterality: Right;  . 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 IRRIGATION AND DEBRIDEMENT BUTTOCKS    . TOE AMPUTATION Right    Family History:  Family History  Problem Relation Age of Onset  . Lymphoma Sister    Family Psychiatric  History: See previous Social History:  Social History   Substance and Sexual Activity  Alcohol Use No     Social History   Substance and Sexual Activity  Drug Use Yes  . Types: Marijuana   Comment: 2 days ago    Social History   Socioeconomic History  . Marital status: Single    Spouse name: Not on file  . Number of children: Not on file  . Years of education: Not on file  . Highest education level: Not on file  Occupational History  . Not on file  Tobacco Use  . Smoking status: Former Smoker    Packs/day: 0.50    Types: Cigarettes  . Smokeless tobacco: Never Used  Substance and Sexual Activity  . Alcohol use: No  . Drug use: Yes    Types: Marijuana    Comment: 2 days ago  . Sexual activity: Never  Other Topics Concern  .  Not on file  Social History Narrative  . Not on file   Social Determinants of Health   Financial Resource Strain: Not on file  Food Insecurity: Not on file  Transportation Needs: Not on file  Physical Activity: Not on file  Stress: Not on file  Social Connections: Not on file   Additional Social History:    Allergies:   Allergies  Allergen Reactions  . Orange Fruit [Citrus] Other (See Comments)    Blisters only from ORANGES!!! Patient is not allergic from all citrus    Labs:  Results for orders placed or performed during the hospital encounter of 06/26/20 (from the past 48 hour(s))  Lipase, blood     Status:  None   Collection Time: 06/26/20  6:23 PM  Result Value Ref Range   Lipase 24 11 - 51 U/L    Comment: Performed at [facility], [address] [place], [place] [address]  Comprehensive metabolic panel     Status: Abnormal   Collection Time: 06/26/20  6:23 PM  Result Value Ref Range   Sodium 137 135 - 145 mmol/L   Potassium 3.1 (L) 3.5 - 5.1 mmol/L   Chloride 100 98 - 111 mmol/L   CO2 25 22 - 32 mmol/L   Glucose, Bld 114 (H) 70 - 99 mg/dL    Comment: Glucose reference range applies only to samples taken after fasting for at least 8 hours.   BUN 17 6 - 20 mg/dL   Creatinine, Ser 8.24 (L) 0.61 - 1.24 mg/dL   Calcium 9.1 8.9 - 23.5 mg/dL   Total Protein 8.2 (H) 6.5 - 8.1 g/dL   Albumin 3.8 3.5 - 5.0 g/dL   AST 15 15 - 41 U/L   ALT 11 0 - 44 U/L   Alkaline Phosphatase 70 38 - 126 U/L   Total Bilirubin 0.8 0.3 - 1.2 mg/dL   GFR, Estimated >36 >14 mL/min    Comment: (NOTE) Calculated using the CKD-EPI Creatinine Equation (2021)    Anion gap 12 5 - 15    Comment: Performed at [facility], [address] [place], [place] [address]  CBC     Status: Abnormal   Collection Time: 06/26/20  6:23 PM  Result Value Ref Range   WBC 8.4 4.0 - 10.5 K/uL   RBC 5.56 4.22 - 5.81 MIL/uL   Hemoglobin 12.4 (L) 13.0 - 17.0 g/dL   HCT 00.8 67.6 - 19.5 %   MCV 72.1 (L) 80.0 - 100.0 fL   MCH 22.3 (L) 26.0 - 34.0 pg   MCHC 30.9 30.0 - 36.0 g/dL   RDW 09.3 (H) 26.7 - 12.4 %   Platelets 409 (H) 150 - 400 K/uL   nRBC 0.0 0.0 - 0.2 %    Comment: Performed at [facility], [address] [place], [place] [address]  Urinalysis, Complete w Microscopic     Status: Abnormal   Collection Time: 06/26/20  7:09 PM  Result Value Ref Range   Color, Urine YELLOW (A) YELLOW   APPearance TURBID (A) CLEAR   Specific Gravity, Urine 1.027 1.005 - 1.030   pH 6.0 5.0 - 8.0   Glucose, UA NEGATIVE NEGATIVE mg/dL   Hgb urine dipstick SMALL (A) NEGATIVE   Bilirubin Urine NEGATIVE NEGATIVE    Ketones, ur 80 (A) NEGATIVE mg/dL   Protein, ur 833 (A) NEGATIVE mg/dL   Nitrite NEGATIVE NEGATIVE   Leukocytes,Ua LARGE (A) NEGATIVE   RBC / HPF 21-50 0 -  5 RBC/hpf   WBC, UA >50 (H) 0 - 5 WBC/hpf   Bacteria, UA FEW (A) NONE SEEN   Squamous Epithelial / LPF NONE SEEN 0 - 5   WBC Clumps PRESENT    Mucus PRESENT    Hyaline Casts, UA PRESENT     Comment: Performed at [facility], [address], [place], [place] [address]  Resp Panel by RT-PCR (Flu A&B, Covid) Nasopharyngeal Swab     Status: None   Collection Time: 06/26/20  9:58 PM   Specimen: Nasopharyngeal Swab; Nasopharyngeal(NP) swabs in vial transport medium  Result Value Ref Range   SARS Coronavirus 2 by RT PCR NEGATIVE NEGATIVE    Comment: (NOTE) SARS-CoV-2 target nucleic acids are NOT DETECTED.  The SARS-CoV-2 RNA is generally detectable in upper respiratory specimens during the acute phase of infection. The lowest concentration of SARS-CoV-2 viral copies this assay can detect is 138 copies/mL. A negative result does not preclude SARS-Cov-2 infection and should not be used as the sole basis for treatment or other patient management decisions. A negative result may occur with  improper specimen collection/handling, submission of specimen other than nasopharyngeal swab, presence of viral mutation(s) within the areas targeted by this assay, and inadequate number of viral copies(<138 copies/mL). A negative result must be combined with clinical observations, patient history, and epidemiological information. The expected result is Negative.  Fact Sheet for Patients:  BloggerCourse.com  Fact Sheet for Healthcare Providers:  SeriousBroker.it  This test is no t yet approved or cleared by the [place] FDA and  has been authorized for detection and/or diagnosis of SARS-CoV-2 by FDA under an Emergency Use Authorization (EUA). This EUA will remain  in effect  (meaning this test can be used) for the duration of the COVID-19 declaration under Section 564(b)(1) of the Act, 21 U.S.C.section 360bbb-3(b)(1), unless the authorization is terminated  or revoked sooner.       Influenza A by PCR NEGATIVE NEGATIVE   Influenza B by PCR NEGATIVE NEGATIVE    Comment: (NOTE) The Xpert Xpress SARS-CoV-2/FLU/RSV plus assay is intended as an aid in the diagnosis of influenza from Nasopharyngeal swab specimens and should not be used as a sole basis for treatment. Nasal washings and aspirates are unacceptable for Xpert Xpress SARS-CoV-2/FLU/RSV testing.  Fact Sheet for Patients: BloggerCourse.com  Fact Sheet for Healthcare Providers: SeriousBroker.it  This test is not yet approved or cleared by the [place] FDA and has been authorized for detection and/or diagnosis of SARS-CoV-2 by FDA under an Emergency Use Authorization (EUA). This EUA will remain in effect (meaning this test can be used) for the duration of the COVID-19 declaration under Section 564(b)(1) of the Act, 21 U.S.C. section 360bbb-3(b)(1), unless the authorization is terminated or revoked.  Performed at [facility], [address], [place], [place] [address]     Current Facility-Administered Medications  Medication Dose Route Frequency Provider Last Rate Last Admin  . feeding supplement (ENSURE ENLIVE / ENSURE PLUS) liquid 237 mL  237 mL Oral TID BM Gilles Chiquito, MD   237 mL at 06/27/20 1020  . ibuprofen (ADVIL) tablet 400 mg  400 mg Oral Q6H PRN Gilles Chiquito, MD      . ondansetron [facility]) tablet 4 mg  4 mg Oral Q6H PRN Gilles Chiquito, MD   4 mg at 06/27/20 1020  . oxyCODONE-acetaminophen (PERCOCET/ROXICET) 5-325 MG per tablet 2 tablet  2 tablet Oral Q4H PRN Loleta Rose, MD   2 tablet at  06/27/20 0545  . sulfamethoxazole-trimethoprim (BACTRIM DS) 800-160 MG per tablet 1 tablet  1 tablet Oral Q12H Gilles ChiquitoSmith, Zachary  P, MD   1 tablet at 06/27/20 [id number]   Current Outpatient Medications  Medication Sig Dispense Refill  . feeding supplement, ENSURE ENLIVE, (ENSURE ENLIVE) LIQD Take 237 mLs by mouth 3 (three) times daily between meals. 237 mL 12  . ferrous sulfate 325 (65 FE) MG tablet Take 1 tablet (325 mg total) by mouth daily with breakfast. (Patient not taking: Reported on 06/27/2020) 30 tablet 0  . HYDROcodone-acetaminophen (NORCO/VICODIN) 5-325 MG tablet Take 1 tablet by mouth every 6 (six) hours as needed for moderate pain. (Patient not taking: No sig reported) 20 tablet 0  . ibuprofen (ADVIL) 200 MG tablet Take by mouth. (Patient not taking: Reported on 06/27/2020)    . ondansetron (ZOFRAN) 4 MG tablet Take 4 mg by mouth every 6 (six) hours as needed for nausea or vomiting. (Patient not taking: No sig reported)    . sodium hypochlorite (DAKIN'S 1/4 STRENGTH) 0.125 % SOLN Irrigate with 1 application as directed every morning. (Patient not taking: Reported on 06/27/2020) 473 mL 0    Musculoskeletal: Strength & Muscle Tone: decreased Gait & Station: unable to stand Patient leans: N/A  Psychiatric Specialty Exam: Physical Exam Vitals and nursing note reviewed.  Constitutional:      Appearance: He is well-developed and well-nourished.  HENT:     Head: Normocephalic and atraumatic.  Eyes:     Conjunctiva/sclera: Conjunctivae normal.     Pupils: Pupils are equal, round, and reactive to light.  Cardiovascular:     Heart sounds: Normal heart sounds.  Pulmonary:     Effort: Pulmonary effort is normal.  Abdominal:     Palpations: Abdomen is soft.  Musculoskeletal:        General: Normal range of motion.     Cervical back: Normal range of motion.  Skin:    General: Skin is warm and dry.       Neurological:     Mental Status: He is alert.     Comments: Paraplegic with limited to no mobility from mid body down  Psychiatric:        Attention and Perception: Attention normal.        Mood and Affect: Mood  is depressed.        Speech: Speech normal.        Behavior: Behavior is cooperative.        Thought Content: Thought content normal.        Cognition and Memory: Cognition normal.        Judgment: Judgment normal.     Review of Systems  Constitutional: Negative.   HENT: Negative.   Eyes: Negative.   Respiratory: Negative.   Cardiovascular: Negative.   Gastrointestinal: Negative.   Musculoskeletal: Negative.   Skin: Negative.   Neurological: Negative.   Psychiatric/Behavioral: Positive for dysphoric mood. Negative for suicidal ideas.    Blood pressure 120/78, pulse 68, temperature 98.2 F (36.8 C), temperature source Oral, resp. rate 19, height 5\' 10"  (1.778 m), weight 44.5 kg, SpO2 99 %.Body mass index is 14.06 kg/m.  General Appearance: Disheveled  Eye Contact:  Good  Speech:  Clear and Coherent  Volume:  Normal  Mood:  Euthymic  Affect:  Constricted  Thought Process:  Goal Directed  Orientation:  Full (Time, Place, and Person)  Thought Content:  Logical  Suicidal Thoughts:  No  Homicidal Thoughts:  No  Memory:  Immediate;  Fair Recent;   Fair Remote;   Fair  Judgement:  Fair  Insight:  Fair  Psychomotor Activity:  Normal  Concentration:  Concentration: Fair  Recall:  [employer] of Knowledge:  Fair  Language:  Fair  Akathisia:  No  Handed:  Right  AIMS (if indicated):     Assets:  Desire for Improvement  ADL's:  Intact  Cognition:  WNL  Sleep:        Treatment Plan Summary: Plan Patient states several times he does not want to even consider medication for depression.  No change therefore in treatment.  Discussed options for treatment of depression.  Praised and encouraged his efforts to stay physically active.  Reviewed overall treatment plan in the hospital.  Signing off with no addition to plan at this point no need for psychiatric hospitalization.  Disposition: No evidence of imminent risk to self or others at present.   Patient does not meet criteria  for psychiatric inpatient admission. Supportive therapy provided about ongoing stressors.  Mordecai Rasmussen, MD 06/27/2020 2:54 PM

## 2020-06-27 NOTE — Evaluation (Signed)
Physical Therapy Evaluation Patient Details Name: Jonathon York MRN: [id number] DOB: 1976/02/09 Today's Date: 06/27/2020   History of Present Illness  Aser Nylund is a 45yoM who [facility] on [facility] c ABD pain, N/V x3d.   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 PMH: fall c T12 SCI (aged 45), paraplegia c WC mobility, independent ADL/IADL, chronic non healing ulcers requring IV ABX, in-patient wound care.  Clinical Impression  Pt admitted with above diagnosis. Pt currently with functional limitations due to the deficits listed below (see "PT Problem List"). Upon entry, pt in bed, awake and agreeable to participate. The pt is alert, pleasant, interactive, and able to provide info regarding prior level of function, both in tolerance and independence. Pt denies any acute changes to his independence, tolerance, or general performance of his baseline mobility (bed mobility, transfers, WC propulsion.) He reports only presenting with continued difficulty of non-healing ulcers and inability to visual inspect and thus inability to manage wound-care independently at home. His WC is not presently here (at home) thus he is unable to mobilize for evaluation, and unable to perform his normal daily mobility routine which puts him at risk of deconditioning. Pt reports never being able to have wound care therapy at home for > 4 weeks "because medicare won't cover it" and this has made full healing of wounds unachievable. Pt also has low body mass and chronic malnutrition which also limit wound healing potential. Pt has never been set up with an air mattress at home, but he self identifies as a side sleeper with ability to perform pressure relief, denies any historical problems with trochanteric pressure wounds.   From a mobility standpoint pt appears to be at baseline, however exam is limited by lack of baseline equip. More concerning  is without his WC here, he is highly susceptible to deconditioning, loss of independence. Pt encouraged to get his WC here to allow for typical mobility routine daily. Will keep patient on caseload at this time to ascertain any further needs, and to promote pt's ability to mobility independently. Pt will benefit from skilled PT intervention to increase independence and safety with basic mobility in preparation for discharge to the venue listed below.       Follow Up Recommendations SNF;Supervision - Intermittent;Other (comment) (wound-care services, help with pericare cleanup.)    Equipment Recommendations  None recommended by PT (Pt needs to be set up for OP WC assessment; his is old and in disrepair.)    Recommendations for Other Services       Precautions / Restrictions Precautions Precautions: None Precaution Comments: mod fall risk Restrictions Weight Bearing Restrictions: No      Mobility  Bed Mobility Overal bed mobility:  (mobility not assessed; Pt reports no acute impairment of strength, mobility. No WC present to demonstrate baseline mobility patterns.)                  Transfers                 General transfer comment: did not attempt; pt does not have his WC with him at the hospital  Ambulation/Gait                Stairs            Wheelchair Mobility    Modified Rankin (Stroke Patients Only)       Balance Overall balance assessment: Needs assistance  Sitting balance-Leahy Scale: Good       Standing balance-Leahy Scale: Zero                 High Level Balance Comments: pt reports no falls in previous 12 months             Pertinent Vitals/Pain Pain Assessment: Faces Pain Score: 6  Faces Pain Scale: Hurts even more Pain Location: sacral and ischial wounds; Pain Descriptors / Indicators: Burning Pain Intervention(s): Monitored during session    Home Living Family/patient expects to be discharged to:: Private  residence Living Arrangements: Alone Available Help at Discharge: Other (Comment) (reports he has no help at home) Type of Home: House Home Access: Ramped entrance     Home Layout: One level Home Equipment: Wheelchair - manual;Shower seat Additional Comments: WC is functional, off-road tires, 8ya has to combine parts from two broken WC to make his functional. High profile roho cushion ~ 1year. Sleeps in regular bed at home.    Prior Function Level of Independence: Independent with assistive device(s)         Comments: Pt able to perform cooking, cleaning, bathing, dressing independently. Has adapted car and able to drive and do his own shopping. Has air pad seat on WC, regular bed with memory foam top. Performs scoot pivot transfers from bed<>WC and WC<>bath. Has not been employed since his injury.     Hand Dominance        Extremity/Trunk Assessment   Upper Extremity Assessment Upper Extremity Assessment: Overall WFL for tasks assessed    Lower Extremity Assessment Lower Extremity Assessment: LLE deficits/detail;RLE deficits/detail RLE Deficits / Details: paraplegia LLE Deficits / Details: s/p remote T12 SCI; no motor function of legs, partial sensation of anterior proximal thighs, buttocks. Reports no need to perform regular stretching or mobility work.       Communication   Communication: No difficulties  Cognition Arousal/Alertness: Awake/alert Behavior During Therapy: WFL for tasks assessed/performed Overall Cognitive Status: Within Functional Limits for tasks assessed                                 General Comments: depressed affect      General Comments General comments (skin integrity, edema, etc.): sacral wounds/fistulas    Exercises     Assessment/Plan    PT Assessment Patient needs continued PT services  PT Problem List Decreased skin integrity;Decreased knowledge of precautions       PT Treatment Interventions Functional mobility  training;Therapeutic activities;Therapeutic exercise;Wheelchair mobility training;Patient/family education    PT Goals (Current goals can be found in the Care Plan section)  Acute Rehab PT Goals Patient Stated Goal: to get help with wound management PT Goal Formulation: With patient Time For Goal Achievement: 07/11/20 Potential to Achieve Goals: Good    Frequency Min 1X/week   Barriers to discharge Decreased caregiver support      Co-evaluation               AM-PAC PT "6 Clicks" Mobility  Outcome Measure Help needed turning from your back to your side while in a flat bed without using bedrails?: None Help needed moving from lying on your back to sitting on the side of a flat bed without using bedrails?: None Help needed moving to and from a bed to a chair (including a wheelchair)?: None Help needed standing up from a chair using your arms (e.g., wheelchair or bedside chair)?: None Help  needed to walk in hospital room?: None Help needed climbing 3-5 steps with a railing? : Total 6 Click Score: 21    End of Session   Activity Tolerance: Patient tolerated treatment well;No increased pain Patient left: in bed;with call bell/phone within reach Nurse Communication: Mobility status PT Visit Diagnosis: Adult, failure to thrive (R62.7);Pain Pain - Right/Left:  (bilat /central pelvis) Pain - part of body: Hip    Time: 1443-1540 PT Time Calculation (min) (ACUTE ONLY): 18 min   Charges:   PT Evaluation $PT Eval Low Complexity: 1 Low          3:08 PM, 06/27/20 Rosamaria Lints, PT, [name] Physical Therapist - [facility]  [phone] (ASCOM)    Savannah Morford C 06/27/2020, 2:47 PM

## 2020-06-27 NOTE — ED Notes (Signed)
Pt resting quietly in darkened exam room watching TV with no distress noted; pt requests pain med for pain to buttocks; ginger ale given to pt at request

## 2020-06-28 DIAGNOSIS — R1084 Generalized abdominal pain: Secondary | ICD-10-CM

## 2020-06-28 LAB — URINE CULTURE

## 2020-06-28 NOTE — ED Notes (Signed)
Pt given cup of ice per request at this time

## 2020-06-28 NOTE — ED Notes (Signed)
Pt sacral dressings changed at time per order due to being soiled.

## 2020-06-28 NOTE — ED Notes (Signed)
Pt given meal tray at this time 

## 2020-06-28 NOTE — ED Notes (Signed)
Pt sleeping. 

## 2020-06-28 NOTE — ED Notes (Signed)
Dietary called per pt request for a regular tray instead of a sandwich tray. Per dietary, will send regular tray for dinner

## 2020-06-29 MED ORDER — CEPHALEXIN 500 MG PO CAPS: 500.0000 mg | ORAL_CAPSULE | Freq: Four times a day (QID) | ORAL | 0 refills | Status: AC

## 2020-06-29 NOTE — [place] FL2 (Signed)
[place] MEDICAID FL2 LEVEL OF CARE SCREENING TOOL     IDENTIFICATION  Patient Name: Jonathon York Birthdate: February 11, 1976 Sex: male Admission Date (Current Location): 06/26/2020  [place] and [place] Number:  [address] [phone] Q Facility and Address:  [facility], [address] [place], [place] [address]      Provider Number: [id number]  Attending Physician Name and Address:  No att. providers found  Relative Name and Phone Number:  Sergio, [place] (Sister)   [phone]    Current Level of Care: Hospital Recommended Level of Care: Skilled Nursing Facility Prior Approval Number:    Date Approved/Denied:   PASRR Number: [id number]  Discharge Plan: SNF    Current Diagnoses: Patient Active Problem List   Diagnosis Date Noted  . Generalized abdominal pain   . UTI (urinary tract infection) 12/02/2019  . UTI (urinary tract infection) due to urinary indwelling catheter (HCC) 12/01/2019  . Infected decubitus ulcer 10/27/2019  . Chronic indwelling Foley catheter 10/27/2019  . Bilateral leg edema 10/27/2019  . 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 Constipation   . Iron deficiency anemia   . Sacral decubitus ulcer, stage IV (HCC) 07/13/2019  . Intractable pain 11/26/2018  . Anxiety 04/22/2018  . Acute osteomyelitis of right foot (HCC) 03/18/2018  . Pressure injury of skin 03/18/2018  . 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 Protein-calorie malnutrition, severe 11/20/2016  . Herpes simplex infection of penis   . Decubitus ulcer 11/19/2016  . 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 Decubitus ulcer of sacral region   . Sepsis (HCC) 04/15/2016  . 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 Pressure ulcer stage III 04/30/2015  . Sterile pyuria 04/30/2015  . Paraplegia (HCC) 04/30/2015  . Neurogenic bladder 04/30/2015  . 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 Moderate  malnutrition (HCC) 04/28/2015    Orientation RESPIRATION BLADDER Height & Weight     Self,Time,Situation,Place  Normal Incontinent Weight: 98 lb (44.5 kg) Height:  5\' 10"  (177.8 cm)  BEHAVIORAL SYMPTOMS/MOOD NEUROLOGICAL BOWEL NUTRITION STATUS      Incontinent Diet  AMBULATORY STATUS COMMUNICATION OF NEEDS Skin   Supervision Verbally PU Stage and Appropriate Care (stage 4 pressure injury; 4X6X.2 cm,  Left ischium stage 4 pressure injury; 6X6X.3 cm,  Right ischium stage 4 pressure injury; 3X8X.5 cm, 100% red and moist, mod amt green drainage and odor, bone palpable)       PU Stage 4 Dressing: Daily (Topical treatment orders perform as follows to absorb drainage and provide antimicrobial benefits: Apply Aquacel [id number]) to sacrum and bilat ischium wounds Q day, then cover with foam dressing. (Change foam dressing Q 3 days or PRN soiling.))               Personal Care Assistance Level of Assistance  Bathing,Feeding,Dressing,Total care Bathing Assistance: Limited assistance Feeding assistance: Independent Dressing Assistance: Independent Total Care Assistance: Limited assistance   Functional Limitations Info  Sight,Hearing,Speech Sight Info: Adequate Hearing Info: Adequate Speech Info: Adequate    SPECIAL CARE FACTORS FREQUENCY        PT Min 1X per week    OT Min 1X per week              Contractures Contractures Info: Present    Additional Factors Info        Patient would like to transfer to long term care once rehab is completed.  Current Medications (06/29/2020):  This is the current hospital active medication list Current Facility-Administered Medications  Medication Dose Route Frequency Provider Last Rate Last Admin  . feeding supplement (ENSURE ENLIVE / ENSURE PLUS) liquid 237 mL  237 mL Oral TID BM Gilles Chiquito, MD   237 mL at 06/27/20 1020  . ibuprofen (ADVIL) tablet 400 mg  400 mg Oral Q6H PRN Gilles Chiquito, MD      . ondansetron  [facility]) tablet 4 mg  4 mg Oral Q6H PRN Gilles Chiquito, MD   4 mg at 06/27/20 1020  . oxyCODONE-acetaminophen (PERCOCET/ROXICET) 5-325 MG per tablet 2 tablet  2 tablet Oral Q4H PRN Loleta Rose, MD   2 tablet at 06/27/20 0545  . sulfamethoxazole-trimethoprim (BACTRIM DS) 800-160 MG per tablet 1 tablet  1 tablet Oral Q12H Gilles Chiquito, MD   1 tablet at 06/27/20 2138   Current Outpatient Medications  Medication Sig Dispense Refill  . feeding supplement, ENSURE ENLIVE, (ENSURE ENLIVE) LIQD Take 237 mLs by mouth 3 (three) times daily between meals. 237 mL 12  . ferrous sulfate 325 (65 FE) MG tablet Take 1 tablet (325 mg total) by mouth daily with breakfast. (Patient not taking: Reported on 06/27/2020) 30 tablet 0  . HYDROcodone-acetaminophen (NORCO/VICODIN) 5-325 MG tablet Take 1 tablet by mouth every 6 (six) hours as needed for moderate pain. (Patient not taking: No sig reported) 20 tablet 0  . ibuprofen (ADVIL) 200 MG tablet Take by mouth. (Patient not taking: Reported on 06/27/2020)    . ondansetron (ZOFRAN) 4 MG tablet Take 4 mg by mouth every 6 (six) hours as needed for nausea or vomiting. (Patient not taking: No sig reported)    . sodium hypochlorite (DAKIN'S 1/4 STRENGTH) 0.125 % SOLN Irrigate with 1 application as directed every morning. (Patient not taking: Reported on 06/27/2020) 473 mL 0     Discharge Medications: Please see discharge summary for a list of discharge medications.  Relevant Imaging Results:  Relevant Lab Results:   Additional Information SS# [id number]  Joseph Art, LCSWA

## 2020-06-29 NOTE — ED Notes (Signed)
Patient has been constantly ringing his call bell. Every time I ask what he needs he cuts me off and will not let me speak. He accused me of yelling at him when he asked for a drink. I did not yell at patient at any time. I reported to [employer] patient is demanding a drink, the Doctor,and the Supervisor with every call bell request. I told him the Doctor will be in and wouldn't let me finish my sentence, and yelled he wanted the Supervisor! Patient was very unhappy no matter how we assisted him.

## 2020-06-29 NOTE — ED Provider Notes (Signed)
Called into patient's room. He stated he wanted to be discharged home. Stated "I don't need no home health." Will discharge home.   Phineas Semen, MD 06/29/20 [phone]

## 2020-06-29 NOTE — TOC Initial Note (Signed)
Transition of Care [facility]) - Initial/Assessment Note    Patient Details  Name: Jonathon York MRN: [id number] Date of Birth: 09/12/1975  [facility]) CM/SW Contact:    Marina Goodell Phone Number:  [phone] 06/29/2020, 8:55 AM  Clinical Narrative:                  Patient presents to [facility] due to concerns over his wounds.  CSW explained TOC note in patient care.  Patient stated he lives alone and is able to perform all ADLs on his own, drive to his doctor and get his medications.  Patient stated the reason he came to the hospital is because he needs to got to a SNF facility and the long term care to get his wounds worked on.  Patient stated he already knew the process and knew what he had to do, and he was going to return home once his wounds got better.  CSW stated I was waiting for PT recommendations and explained SNF placement process.  Patient interrupted CSW and stated he already knew the process.   Expected Discharge Plan: Skilled Nursing Facility Barriers to Discharge: ED SNF auth   Patient Goals and CMS Choice Patient states their goals for this hospitalization and ongoing recovery are:: Wants to go to SNF and long term care until his wounds clear up and return home.   Choice offered to / list presented to : Patient  Expected Discharge Plan and Services Expected Discharge Plan: Skilled Nursing Facility In-house Referral: Clinical Social Work   Post Acute Care Choice: Skilled Nursing Facility Living arrangements for the past 2 months: Single Family Home                                      Prior Living Arrangements/Services Living arrangements for the past 2 months: Single Family Home Lives with:: Self Patient language and need for interpreter reviewed:: Yes Do you feel safe going back to the place where you live?: Yes      Need for Family Participation in Patient Care: No (Comment) Care giver support system in place?: Yes (comment)    Criminal Activity/Legal Involvement Pertinent to Current Situation/Hospitalization: No - Comment as needed  Activities of Daily Living      Permission Sought/Granted Permission sought to share information with : Facility Medical sales representative Permission granted to share information with : Yes, Verbal Permission Granted  Share Information with NAME: Crystal, [place] (Sister)   [phone]           Emotional Assessment Appearance:: Appears stated age Attitude/Demeanor/Rapport: Engaged,Self-Confident Affect (typically observed): Stable Orientation: : Oriented to Self,Oriented to Place,Oriented to  Time,Oriented to Situation Alcohol / Substance Use: Not Applicable Psych Involvement: No (comment)  Admission diagnosis:  abd pain ems Patient Active Problem List   Diagnosis Date Noted   Generalized abdominal pain    UTI (urinary tract infection) 12/02/2019   UTI (urinary tract infection) due to urinary indwelling catheter (HCC) 12/01/2019   Infected decubitus ulcer 10/27/2019   Chronic indwelling Foley catheter 10/27/2019   Bilateral leg edema 10/27/2019   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    Constipation    Iron deficiency anemia    Sacral decubitus ulcer, stage IV (HCC) 07/13/2019   Intractable pain 11/26/2018   Anxiety 04/22/2018   Acute osteomyelitis of right foot (HCC) 03/18/2018   Pressure injury of skin 03/18/2018   Stage IV pressure ulcer of  right buttock (HCC)    Protein-calorie malnutrition, severe 11/20/2016   Herpes simplex infection of penis    Decubitus ulcer 11/19/2016   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   Decubitus ulcer of sacral region    Sepsis (HCC) 04/15/2016   Amputation of fourth toe, right, 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   Pressure ulcer stage III 04/30/2015   Sterile pyuria 04/30/2015   Paraplegia (HCC) 04/30/2015   Neurogenic bladder 04/30/2015   Toe osteomyelitis, right (HCC) 04/28/2015   Moderate  malnutrition (HCC) 04/28/2015   PCP:  Mick Sell, MD Pharmacy:   [facility] [address], [place] - [address] [place] [address] Phone: [phone] Fax: [phone]  [id number] - [place], [place] - [address] AT [facility] [address] [facility] [place] [address] Phone: [phone] Fax: [phone]  [facility] #[id number] - [place], [place] - [address] AT [facility] OF [address] [place] [address] Phone: [phone] Fax: [phone]     Social Determinants of Health (SDOH) Interventions    Readmission Risk Interventions No flowsheet data found.

## 2020-06-29 NOTE — Discharge Instructions (Addendum)
Please seek medical attention for any high fevers, chest pain, shortness of breath, change in behavior, persistent vomiting, bloody stool or any other new or concerning symptoms.  

## 2020-06-29 NOTE — TOC Transition Note (Signed)
Transition of Care [facility]) - CM/SW Discharge Note   Patient Details  Name: Jonathon York MRN: [id number] Date of Birth: 30-Mar-1976  Transition of Care [facility]) CM/SW Contact:  Marina Goodell Phone Number: [phone] 06/29/2020, 9:04 AM   Clinical Narrative:     Patient wants to go home, does not want to wait for placement and has refused home health. EDP will d/c.   Final next level of care: Home/Self Care Barriers to Discharge: ED SNF auth   Patient Goals and CMS Choice Patient states their goals for this hospitalization and ongoing recovery are:: Wants to go to SNF and long term care until his wounds clear up and return home.   Choice offered to / list presented to : Patient  Discharge Placement                       Discharge Plan and Services In-house Referral: Clinical Social Work   Post Acute Care Choice: Skilled Nursing Facility                               Social Determinants of Health (SDOH) Interventions     Readmission Risk Interventions No flowsheet data found.

## 2020-06-29 NOTE — ED Provider Notes (Signed)
-----------------------------------------   5:06 AM on 06/29/2020 -----------------------------------------   Blood pressure 108/69, pulse 82, temperature 98.2 F (36.8 C), temperature source Oral, resp. rate 17, height 5\' 10"  (1.778 m), weight 44.5 kg, SpO2 100 %.  The patient is calm and cooperative at this time.  There have been no acute events since the last update.  Awaiting disposition plan from Social Work team.   , MD 06/29/20 [phone]

## 2020-08-28 ENCOUNTER — Encounter: Payer: Self-pay | Admitting: Emergency Medicine

## 2020-08-28 ENCOUNTER — Emergency Department: Payer: Medicare Other

## 2020-08-28 ENCOUNTER — Inpatient Hospital Stay
Admission: EM | Admit: 2020-08-28 | Discharge: 2020-09-02 | DRG: 698 | Disposition: A | Discharge status: Home / Self Care | Payer: Medicare Other | Attending: Internal Medicine | Admitting: Internal Medicine

## 2020-08-28 DIAGNOSIS — E43 Unspecified severe protein-calorie malnutrition: Secondary | ICD-10-CM | POA: Diagnosis present

## 2020-08-28 DIAGNOSIS — E876 Hypokalemia: Secondary | ICD-10-CM | POA: Diagnosis not present

## 2020-08-28 DIAGNOSIS — T83518A Infection and inflammatory reaction due to other urinary catheter, initial encounter: Principal | ICD-10-CM | POA: Diagnosis present

## 2020-08-28 DIAGNOSIS — L988 Other specified disorders of the skin and subcutaneous tissue: Secondary | ICD-10-CM

## 2020-08-28 DIAGNOSIS — Z882 Allergy status to sulfonamides status: Secondary | ICD-10-CM

## 2020-08-28 DIAGNOSIS — L89324 Pressure ulcer of left buttock, stage 4: Secondary | ICD-10-CM | POA: Diagnosis present

## 2020-08-28 DIAGNOSIS — L89314 Pressure ulcer of right buttock, stage 4: Secondary | ICD-10-CM | POA: Diagnosis present

## 2020-08-28 DIAGNOSIS — Z87891 Personal history of nicotine dependence: Secondary | ICD-10-CM

## 2020-08-28 DIAGNOSIS — Z79899 Other long term (current) drug therapy: Secondary | ICD-10-CM

## 2020-08-28 DIAGNOSIS — N360 Urethral fistula: Secondary | ICD-10-CM | POA: Diagnosis present

## 2020-08-28 DIAGNOSIS — Z978 Presence of other specified devices: Secondary | ICD-10-CM

## 2020-08-28 DIAGNOSIS — Z20822 Contact with and (suspected) exposure to covid-19: Secondary | ICD-10-CM | POA: Diagnosis present

## 2020-08-28 DIAGNOSIS — F32A Depression, unspecified: Secondary | ICD-10-CM | POA: Diagnosis present

## 2020-08-28 DIAGNOSIS — N319 Neuromuscular dysfunction of bladder, unspecified: Secondary | ICD-10-CM | POA: Diagnosis present

## 2020-08-28 DIAGNOSIS — R112 Nausea with vomiting, unspecified: Secondary | ICD-10-CM

## 2020-08-28 DIAGNOSIS — Z681 Body mass index (BMI) 19 or less, adult: Secondary | ICD-10-CM

## 2020-08-28 DIAGNOSIS — Z91018 Allergy to other foods: Secondary | ICD-10-CM

## 2020-08-28 DIAGNOSIS — Y846 Urinary catheterization as the cause of abnormal reaction of the patient, or of later complication, without mention of misadventure at the time of the procedure: Secondary | ICD-10-CM | POA: Diagnosis present

## 2020-08-28 DIAGNOSIS — N3091 Cystitis, unspecified with hematuria: Secondary | ICD-10-CM | POA: Diagnosis present

## 2020-08-28 DIAGNOSIS — L89154 Pressure ulcer of sacral region, stage 4: Secondary | ICD-10-CM | POA: Diagnosis present

## 2020-08-28 DIAGNOSIS — N419 Inflammatory disease of prostate, unspecified: Secondary | ICD-10-CM | POA: Diagnosis present

## 2020-08-28 DIAGNOSIS — Z89421 Acquired absence of other right toe(s): Secondary | ICD-10-CM

## 2020-08-28 DIAGNOSIS — F419 Anxiety disorder, unspecified: Secondary | ICD-10-CM | POA: Diagnosis present

## 2020-08-28 DIAGNOSIS — R339 Retention of urine, unspecified: Secondary | ICD-10-CM

## 2020-08-28 DIAGNOSIS — N390 Urinary tract infection, site not specified: Secondary | ICD-10-CM | POA: Diagnosis present

## 2020-08-28 DIAGNOSIS — Z9359 Other cystostomy status: Secondary | ICD-10-CM

## 2020-08-28 DIAGNOSIS — G8220 Paraplegia, unspecified: Secondary | ICD-10-CM | POA: Diagnosis present

## 2020-08-28 DIAGNOSIS — Z87440 Personal history of urinary (tract) infections: Secondary | ICD-10-CM

## 2020-08-28 DIAGNOSIS — M4628 Osteomyelitis of vertebra, sacral and sacrococcygeal region: Secondary | ICD-10-CM | POA: Diagnosis present

## 2020-08-28 DIAGNOSIS — M8660 Other chronic osteomyelitis, unspecified site: Secondary | ICD-10-CM | POA: Diagnosis present

## 2020-08-28 LAB — COMPREHENSIVE METABOLIC PANEL
ALT: 11 U/L (ref 0–44)
AST: 15 U/L (ref 15–41)
Albumin: 3.6 g/dL (ref 3.5–5.0)
Alkaline Phosphatase: 70 U/L (ref 38–126)
Anion gap: 14 (ref 5–15)
BUN: 13 mg/dL (ref 6–20)
CO2: 26 mmol/L (ref 22–32)
Calcium: 8.9 mg/dL (ref 8.9–10.3)
Chloride: 97 mmol/L — ABNORMAL LOW (ref 98–111)
Creatinine, Ser: 0.54 mg/dL — ABNORMAL LOW (ref 0.61–1.24)
GFR, Estimated: >60 mL/min (ref >60)
Glucose, Bld: 115 mg/dL — ABNORMAL HIGH (ref 70–99)
Potassium: 3.2 mmol/L — ABNORMAL LOW (ref 3.5–5.1)
Sodium: 137 mmol/L (ref 135–145)
Total Bilirubin: 0.9 mg/dL (ref 0.3–1.2)
Total Protein: 7.7 g/dL (ref 6.5–8.1)

## 2020-08-28 LAB — CBC WITH DIFFERENTIAL/PLATELET
Abs Immature Granulocytes: 0.04 10*3/uL (ref 0.00–0.07)
Basophils Absolute: 0.1 10*3/uL (ref 0.0–0.1)
Basophils Relative: 1 %
Eosinophils Absolute: 0 10*3/uL (ref 0.0–0.5)
Eosinophils Relative: 0 %
HCT: 39.1 % (ref 39.0–52.0)
Hemoglobin: 11.9 g/dL — ABNORMAL LOW (ref 13.0–17.0)
Immature Granulocytes: 0 %
Lymphocytes Relative: 12 %
Lymphs Abs: 1.5 10*3/uL (ref 0.7–4.0)
MCH: 21.8 pg — ABNORMAL LOW (ref 26.0–34.0)
MCHC: 30.4 g/dL (ref 30.0–36.0)
MCV: 71.6 fL — ABNORMAL LOW (ref 80.0–100.0)
Monocytes Absolute: 0.7 10*3/uL (ref 0.1–1.0)
Monocytes Relative: 5 %
Neutro Abs: 10.7 10*3/uL — ABNORMAL HIGH (ref 1.7–7.7)
Neutrophils Relative %: 82 %
Platelets: 514 10*3/uL — ABNORMAL HIGH (ref 150–400)
RBC: 5.46 MIL/uL (ref 4.22–5.81)
RDW: 19.6 % — ABNORMAL HIGH (ref 11.5–15.5)
WBC: 13 10*3/uL — ABNORMAL HIGH (ref 4.0–10.5)
nRBC: 0 % (ref 0.0–0.2)

## 2020-08-28 LAB — URINALYSIS, COMPLETE (UACMP) WITH MICROSCOPIC
Bilirubin Urine: NEGATIVE
Glucose, UA: NEGATIVE mg/dL
Hgb urine dipstick: NEGATIVE
Ketones, ur: 5 mg/dL — AB
Nitrite: NEGATIVE
Protein, ur: 100 mg/dL — AB
Specific Gravity, Urine: 1.026 (ref 1.005–1.030)
Squamous Epithelial / HPF: NONE SEEN (ref 0–5)
pH: 8 (ref 5.0–8.0)

## 2020-08-28 LAB — RESP PANEL BY RT-PCR (FLU A&B, COVID) ARPGX2
Influenza A by PCR: NEGATIVE
Influenza B by PCR: NEGATIVE
SARS Coronavirus 2 by RT PCR: NEGATIVE

## 2020-08-28 LAB — MAGNESIUM: Magnesium: 2 mg/dL (ref 1.7–2.4)

## 2020-08-28 LAB — LACTIC ACID, PLASMA: Lactic Acid, Venous: 1.6 mmol/L (ref 0.5–1.9)

## 2020-08-28 MED ORDER — SODIUM CHLORIDE 0.9 % IV SOLN
1.0000 g | Freq: Once | INTRAVENOUS | Status: AC
  Administered 2020-08-29: 1 g via INTRAVENOUS
  Filled 2020-08-28: qty 10

## 2020-08-28 MED ORDER — ONDANSETRON HCL 4 MG/2ML IJ SOLN
4.0000 mg | Freq: Once | INTRAMUSCULAR | Status: AC
  Administered 2020-08-28: 4 mg via INTRAVENOUS
  Filled 2020-08-28: qty 2

## 2020-08-28 MED ORDER — IOHEXOL 300 MG/ML  SOLN
100.0000 mL | Freq: Once | INTRAMUSCULAR | Status: AC | PRN
  Administered 2020-08-28: 100 mL via INTRAVENOUS

## 2020-08-28 MED ORDER — LACTATED RINGERS IV BOLUS
1000.0000 mL | Freq: Once | INTRAVENOUS | Status: AC
  Administered 2020-08-28: 1000 mL via INTRAVENOUS

## 2020-08-28 MED ORDER — MORPHINE SULFATE (PF) 4 MG/ML IV SOLN
4.0000 mg | Freq: Once | INTRAVENOUS | Status: AC
  Administered 2020-08-28: 4 mg via INTRAVENOUS
  Filled 2020-08-28: qty 1

## 2020-08-28 MED ORDER — POTASSIUM CHLORIDE CRYS ER 20 MEQ PO TBCR
80.0000 meq | EXTENDED_RELEASE_TABLET | Freq: Once | ORAL | Status: AC
  Administered 2020-08-28: 80 meq via ORAL
  Filled 2020-08-28: qty 4

## 2020-08-28 NOTE — ED Provider Notes (Signed)
[facility] Emergency Department Provider Note  ____________________________________________   Event Date/Time   First MD Initiated Contact with Patient 08/28/20 2109     (approximate)  I have reviewed the triage vital signs and the nursing notes.   HISTORY  Chief Complaint Emesis   HPI Jonathon York is a 45 y.o. 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  Patient states he has significant pain around the site of his sacral decubitus wounds.  He denies any headache, earache, sore throat, fevers, chest pain, cough, shortness of breath, upper abdominal pain or extremity pain.  No rashes or recent injuries or falls.  States he is compliant with all his medications.  States he manages his wounds at home with on his own without home health.         Past Medical History:  Diagnosis Date  . Anxiety   . Decubitus ulcer   . Depression   . Neurogenic bladder   . Osteomyelitis (HCC)   . 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 Generalized abdominal pain   . UTI (urinary tract infection) 12/02/2019  . UTI (urinary tract infection) due to urinary indwelling catheter (HCC) 12/01/2019  . Infected decubitus ulcer 10/27/2019  . Chronic indwelling Foley catheter 10/27/2019  . Bilateral leg edema 10/27/2019  . 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 Constipation   . Iron deficiency anemia   . Sacral decubitus ulcer, stage IV (HCC) 07/13/2019  . Intractable pain 11/26/2018  . Anxiety 04/22/2018  . Acute osteomyelitis of right foot (HCC)  03/18/2018  . Pressure injury of skin 03/18/2018  . 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 Protein-calorie malnutrition, severe 11/20/2016  . Herpes simplex infection of penis   . Decubitus ulcer 11/19/2016  . 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 Decubitus ulcer of sacral region   . Sepsis (HCC) 04/15/2016  . 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 Pressure ulcer stage III 04/30/2015  . Sterile pyuria 04/30/2015  . Paraplegia (HCC) 04/30/2015  . Neurogenic bladder 04/30/2015  . 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 Moderate malnutrition (HCC) 04/28/2015    Past Surgical History:  Procedure Laterality Date  . AMPUTATION Right 03/19/2018   Procedure: 5th Metatarsal Resection;  Surgeon: Recardo Evangelist, DPM;  Location: [facility] ORS;  Service: Podiatry;  Laterality: Right;  . 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 IRRIGATION AND DEBRIDEMENT BUTTOCKS    . TOE AMPUTATION Right     Prior to Admission medications   Medication Sig Start Date End Date Taking? Authorizing Provider  feeding supplement, ENSURE ENLIVE, (ENSURE ENLIVE) LIQD Take 237 mLs by mouth 3 (three) times daily between meals. 12/05/19   Briant Cedar, MD  ferrous sulfate 325 (65 FE) MG tablet Take 1 tablet (325 mg total) by mouth daily with breakfast. Patient not taking: Reported on 06/27/2020 12/05/19 01/04/20  Briant Cedar, MD  HYDROcodone-acetaminophen (  NORCO/VICODIN) 5-325 MG tablet Take 1 tablet by mouth every 6 (six) hours as needed for moderate pain. Patient not taking: No sig reported 10/28/19   Enedina Finner, MD  ibuprofen (ADVIL) 200 MG tablet Take by mouth. Patient not taking: Reported on 06/27/2020    [provider]  ondansetron (ZOFRAN) 4 MG tablet Take 4 mg by mouth every 6 (six) hours as needed for nausea  or vomiting. Patient not taking: No sig reported    [provider]  sodium hypochlorite (DAKIN'S 1/4 STRENGTH) 0.125 % SOLN Irrigate with 1 application as directed every morning. Patient not taking: Reported on 06/27/2020 12/05/19   Briant Cedar, MD    Allergies Orange fruit [citrus] and Sulfa antibiotics  Family History  Problem Relation Age of Onset  . Lymphoma Sister     Social History Social History   Tobacco Use  . Smoking status: Former Smoker    Packs/day: 0.50    Types: Cigarettes  . Smokeless tobacco: Never Used  Substance Use Topics  . Alcohol use: No  . Drug use: Yes    Types: Marijuana    Comment: 2 days ago    Review of Systems  Review of Systems  Constitutional: Positive for malaise/fatigue. Negative for chills and fever.  HENT: Negative for sore throat.   Eyes: Negative for pain.  Respiratory: Negative for cough and stridor.   Cardiovascular: Negative for chest pain.  Gastrointestinal: Positive for abdominal pain, nausea and vomiting.  Genitourinary: Negative for dysuria.  Musculoskeletal: Positive for back pain.  Skin: Negative for rash.  Neurological: Negative for seizures, loss of consciousness and headaches.  Psychiatric/Behavioral: Negative for suicidal ideas.  All other systems reviewed and are negative.     ____________________________________________   PHYSICAL EXAM:  VITAL SIGNS: ED Triage Vitals  Enc Vitals Group     BP 08/28/20 1959 113/84     Pulse Rate 08/28/20 1959 (!) 108     Resp 08/28/20 1959 18     Temp 08/28/20 1959 98.4 F (36.9 C)     Temp Source 08/28/20 1959 Oral     SpO2 08/28/20 1959 99 %     Weight 08/28/20 1958 100 lb (45.4 kg)     Height 08/28/20 1958 5\' 10"  (1.778 m)     Head Circumference --      Peak Flow --      Pain Score --      Pain Loc --      Pain Edu? --      Excl. in GC? --    Vitals:   08/28/20 1959 08/28/20 2315  BP: 113/84 111/78  Pulse: (!) 108   Resp: 18 15  Temp: 98.4  F (36.9 C)   SpO2: 99%    Physical Exam Vitals and nursing note reviewed.  Constitutional:      General: He is in acute distress.     Appearance: He is well-developed. He is ill-appearing.  HENT:     Head: Normocephalic and atraumatic.     Right Ear: External ear normal.     Left Ear: External ear normal.     Nose: Nose normal.     Mouth/Throat:     Mouth: Mucous membranes are dry.  Eyes:     Conjunctiva/sclera: Conjunctivae normal.  Cardiovascular:     Rate and Rhythm: Regular rhythm. Tachycardia present.     Heart sounds: No murmur heard.   Pulmonary:     Effort: Pulmonary effort is normal. No respiratory distress.  Breath sounds: Normal breath sounds.  Abdominal:     Palpations: Abdomen is soft.     Tenderness: There is abdominal tenderness in the epigastric area.  Musculoskeletal:     Cervical back: Neck supple.  Skin:    General: Skin is warm and dry.     Capillary Refill: Capillary refill takes 2 to 3 seconds.  Neurological:     Mental Status: He is alert and oriented to person, place, and time.  Psychiatric:        Mood and Affect: Mood normal.     Several stage IV sacral decubitus wounds over the patient's sacrum.  No purulence, bleeding or significant surrounding skin changes including significant induration.  Overall wounds appear fairly clean with healthy granulation tissue. ____________________________________________   LABS (all labs ordered are listed, but only abnormal results are displayed)  Labs Reviewed  CBC WITH DIFFERENTIAL/PLATELET - Abnormal; Notable for the following components:      Result Value   WBC 13.0 (*)    Hemoglobin 11.9 (*)    MCV 71.6 (*)    MCH 21.8 (*)    RDW 19.6 (*)    Platelets 514 (*)    Neutro Abs 10.7 (*)    All other components within normal limits  COMPREHENSIVE METABOLIC PANEL - Abnormal; Notable for the following components:   Potassium 3.2 (*)    Chloride 97 (*)    Glucose, Bld 115 (*)    Creatinine, Ser  0.54 (*)    All other components within normal limits  RESP PANEL BY RT-PCR (FLU A&B, COVID) ARPGX2  CULTURE, BLOOD (SINGLE)  URINE CULTURE  MAGNESIUM  LACTIC ACID, PLASMA  LACTIC ACID, PLASMA  URINALYSIS, COMPLETE (UACMP) WITH MICROSCOPIC   ____________________________________________  EKG  ____________________________________________  RADIOLOGY  ED MD interpretation: Small right lower lung nodule.  No clear focal consolidation, effusion, significant edema, pneumothorax or other clear acute intrathoracic process.  Official radiology report(s): DG Chest Port 1 View  Result Date: 08/28/2020 CLINICAL DATA:  Nausea, vomiting, paraplegia, concern for sepsis EXAM: PORTABLE CHEST 1 VIEW COMPARISON:  03/18/2018 chest radiograph. FINDINGS: Partially visualized bilateral posterior spinal fusion hardware extending inferiorly from the midthoracic spine. Stable cardiomediastinal silhouette with normal heart size. No pneumothorax. No pleural effusion. No pulmonary edema. New tiny nodular 4 mm opacity in the peripheral lower right lung. Otherwise clear lungs. IMPRESSION: New tiny nodular 4 mm opacity in the peripheral lower right lung, cannot exclude a pulmonary nodule. Otherwise no active disease in the chest. Suggest short-term outpatient follow-up chest CT. Electronically Signed   By: Delbert Phenix M.D.   On: 08/28/2020 22:03    ____________________________________________   PROCEDURES  Procedure(s) performed (including Critical Care):  .1-3 Lead EKG Interpretation Performed by: Gilles Chiquito, MD Authorized by: Gilles Chiquito, MD     Interpretation: normal     ECG rate assessment: tachycardic     Rhythm: sinus tachycardia     Ectopy: none     Conduction: normal       ____________________________________________   INITIAL IMPRESSION / ASSESSMENT AND PLAN / ED COURSE      Patient presents with above-stated history exam for assessment of some nonbloody nonbilious vomiting  and some lower back pain and abdominal pain.  On arrival he is tachycardic with heart rate of 108 with otherwise stable vital signs.  On exam he has some mild tenderness on exam and multiple sacral decubitus wound stage IV that do not appear grossly infected.  Differential includes gastritis,  diverticulitis, SBO, appendicitis, sepsis from osteomyelitis, urinary tract infection, kidney stone metabolic derangements.  Chest x-ray has no evidence of pneumonia or other clear acute thoracic process.  CBC with WBC count of 13 hemoglobin at baseline.  CMP remarkable for K of 3.2 with no other significant electrolyte or metabolic derangements.  No evidence of hepatitis or cholestasis.  Magnesium WNL.  Given tachycardia and elevated white blood cell count will obtain blood cultures and lactic acid and give IV fluids in addition to pain and some analgesia pending CT of the abdomen pelvis.  Care patient signed over to oncoming provider at approximately 2200.  Plan is to follow-up remaining labs and CT and reassess.  If CT labs are reassuring with patient feeling better he may be safe for discharge with outpatient follow-up.     ____________________________________________   FINAL CLINICAL IMPRESSION(S) / ED DIAGNOSES  Final diagnoses:  Decubitus ulcer of sacral region, stage 4 (HCC)  Nausea and vomiting, intractability of vomiting not specified, unspecified vomiting type  Hypokalemia    Medications  iohexol (OMNIPAQUE) 300 MG/ML solution 100 mL (has no administration in time range)  lactated ringers bolus 1,000 mL (1,000 mLs Intravenous New Bag/Given 08/28/20 2318)  morphine 4 MG/ML injection 4 mg (4 mg Intravenous Given 08/28/20 2318)  ondansetron (ZOFRAN) injection 4 mg (4 mg Intravenous Given 08/28/20 2314)  potassium chloride SA (KLOR-CON) CR tablet 80 mEq (80 mEq Oral Given 08/28/20 2244)     ED Discharge Orders    None       Note:  This document was prepared using Dragon voice recognition  software and may include unintentional dictation errors.   Gilles ChiquitoSmith, Sahory Nordling P, MD 08/28/20 2322

## 2020-08-28 NOTE — ED Triage Notes (Signed)
EMS brought pt in from home for c/o N/V since Thursday, foley in place (pt paraplegic), decub present per pt

## 2020-08-28 NOTE — ED Triage Notes (Signed)
Pt repots N/V x4 days. Pt reports he is paraplegic and has multiple pressure wounds to the buttocks area.

## 2020-08-29 ENCOUNTER — Encounter: Payer: Self-pay | Admitting: Internal Medicine

## 2020-08-29 DIAGNOSIS — Z87440 Personal history of urinary (tract) infections: Secondary | ICD-10-CM | POA: Diagnosis not present

## 2020-08-29 DIAGNOSIS — G8220 Paraplegia, unspecified: Secondary | ICD-10-CM | POA: Diagnosis present

## 2020-08-29 DIAGNOSIS — Z20822 Contact with and (suspected) exposure to covid-19: Secondary | ICD-10-CM | POA: Diagnosis present

## 2020-08-29 DIAGNOSIS — N419 Inflammatory disease of prostate, unspecified: Secondary | ICD-10-CM | POA: Diagnosis present

## 2020-08-29 DIAGNOSIS — T83518A Infection and inflammatory reaction due to other urinary catheter, initial encounter: Secondary | ICD-10-CM | POA: Diagnosis present

## 2020-08-29 DIAGNOSIS — E876 Hypokalemia: Secondary | ICD-10-CM | POA: Diagnosis present

## 2020-08-29 DIAGNOSIS — Z79899 Other long term (current) drug therapy: Secondary | ICD-10-CM | POA: Diagnosis not present

## 2020-08-29 DIAGNOSIS — M4628 Osteomyelitis of vertebra, sacral and sacrococcygeal region: Secondary | ICD-10-CM | POA: Diagnosis present

## 2020-08-29 DIAGNOSIS — Z87891 Personal history of nicotine dependence: Secondary | ICD-10-CM | POA: Diagnosis not present

## 2020-08-29 DIAGNOSIS — Z978 Presence of other specified devices: Secondary | ICD-10-CM

## 2020-08-29 DIAGNOSIS — L89314 Pressure ulcer of right buttock, stage 4: Secondary | ICD-10-CM | POA: Diagnosis present

## 2020-08-29 DIAGNOSIS — Z681 Body mass index (BMI) 19 or less, adult: Secondary | ICD-10-CM | POA: Diagnosis not present

## 2020-08-29 DIAGNOSIS — L988 Other specified disorders of the skin and subcutaneous tissue: Secondary | ICD-10-CM | POA: Diagnosis not present

## 2020-08-29 DIAGNOSIS — M8660 Other chronic osteomyelitis, unspecified site: Secondary | ICD-10-CM

## 2020-08-29 DIAGNOSIS — N360 Urethral fistula: Secondary | ICD-10-CM | POA: Diagnosis present

## 2020-08-29 DIAGNOSIS — N390 Urinary tract infection, site not specified: Secondary | ICD-10-CM

## 2020-08-29 DIAGNOSIS — R112 Nausea with vomiting, unspecified: Secondary | ICD-10-CM

## 2020-08-29 DIAGNOSIS — Z882 Allergy status to sulfonamides status: Secondary | ICD-10-CM | POA: Diagnosis not present

## 2020-08-29 DIAGNOSIS — Y846 Urinary catheterization as the cause of abnormal reaction of the patient, or of later complication, without mention of misadventure at the time of the procedure: Secondary | ICD-10-CM | POA: Diagnosis present

## 2020-08-29 DIAGNOSIS — N3091 Cystitis, unspecified with hematuria: Secondary | ICD-10-CM | POA: Diagnosis present

## 2020-08-29 DIAGNOSIS — L89154 Pressure ulcer of sacral region, stage 4: Secondary | ICD-10-CM | POA: Diagnosis present

## 2020-08-29 DIAGNOSIS — L89324 Pressure ulcer of left buttock, stage 4: Secondary | ICD-10-CM | POA: Diagnosis present

## 2020-08-29 DIAGNOSIS — N319 Neuromuscular dysfunction of bladder, unspecified: Secondary | ICD-10-CM | POA: Diagnosis present

## 2020-08-29 DIAGNOSIS — Z89421 Acquired absence of other right toe(s): Secondary | ICD-10-CM | POA: Diagnosis not present

## 2020-08-29 DIAGNOSIS — Z91018 Allergy to other foods: Secondary | ICD-10-CM | POA: Diagnosis not present

## 2020-08-29 DIAGNOSIS — F419 Anxiety disorder, unspecified: Secondary | ICD-10-CM | POA: Diagnosis present

## 2020-08-29 DIAGNOSIS — F32A Depression, unspecified: Secondary | ICD-10-CM | POA: Diagnosis present

## 2020-08-29 DIAGNOSIS — E43 Unspecified severe protein-calorie malnutrition: Secondary | ICD-10-CM | POA: Diagnosis present

## 2020-08-29 DIAGNOSIS — N401 Enlarged prostate with lower urinary tract symptoms: Secondary | ICD-10-CM | POA: Diagnosis not present

## 2020-08-29 LAB — BASIC METABOLIC PANEL
Anion gap: 8 (ref 5–15)
BUN: 9 mg/dL (ref 6–20)
CO2: 27 mmol/L (ref 22–32)
Calcium: 8.2 mg/dL — ABNORMAL LOW (ref 8.9–10.3)
Chloride: 103 mmol/L (ref 98–111)
Creatinine, Ser: 0.47 mg/dL — ABNORMAL LOW (ref 0.61–1.24)
GFR, Estimated: >60 mL/min (ref >60)
Glucose, Bld: 112 mg/dL — ABNORMAL HIGH (ref 70–99)
Potassium: 4 mmol/L (ref 3.5–5.1)
Sodium: 138 mmol/L (ref 135–145)

## 2020-08-29 LAB — CBC
HCT: 31.3 % — ABNORMAL LOW (ref 39.0–52.0)
Hemoglobin: 9.5 g/dL — ABNORMAL LOW (ref 13.0–17.0)
MCH: 21.8 pg — ABNORMAL LOW (ref 26.0–34.0)
MCHC: 30.4 g/dL (ref 30.0–36.0)
MCV: 72 fL — ABNORMAL LOW (ref 80.0–100.0)
Platelets: 362 10*3/uL (ref 150–400)
RBC: 4.35 MIL/uL (ref 4.22–5.81)
RDW: 18.6 % — ABNORMAL HIGH (ref 11.5–15.5)
WBC: 9.2 10*3/uL (ref 4.0–10.5)
nRBC: 0 % (ref 0.0–0.2)

## 2020-08-29 LAB — LIPASE, BLOOD: Lipase: 30 U/L (ref 11–51)

## 2020-08-29 LAB — HIV ANTIBODY (ROUTINE TESTING W REFLEX): HIV Screen 4th Generation wRfx: NONREACTIVE

## 2020-08-29 MED ORDER — OCUVITE-LUTEIN PO CAPS
1.0000 | ORAL_CAPSULE | Freq: Every day | ORAL | Status: DC
  Administered 2020-08-29 – 2020-09-01 (×4): 1 via ORAL
  Filled 2020-08-29 (×6): qty 1

## 2020-08-29 MED ORDER — ASCORBIC ACID 500 MG PO TABS
250.0000 mg | ORAL_TABLET | Freq: Two times a day (BID) | ORAL | Status: DC
  Administered 2020-08-29 – 2020-09-02 (×9): 250 mg via ORAL
  Filled 2020-08-29 (×9): qty 1

## 2020-08-29 MED ORDER — MORPHINE SULFATE (PF) 4 MG/ML IV SOLN
4.0000 mg | Freq: Once | INTRAVENOUS | Status: AC
  Administered 2020-08-29: 4 mg via INTRAVENOUS
  Filled 2020-08-29: qty 1

## 2020-08-29 MED ORDER — BOOST / RESOURCE BREEZE PO LIQD CUSTOM
1.0000 | Freq: Three times a day (TID) | ORAL | Status: DC
  Administered 2020-08-29 – 2020-09-02 (×9): 1 via ORAL

## 2020-08-29 MED ORDER — ONDANSETRON HCL 4 MG/2ML IJ SOLN: 4.0000 mg | Freq: Four times a day (QID) | INTRAMUSCULAR | Status: DC | PRN

## 2020-08-29 MED ORDER — POTASSIUM CHLORIDE IN NACL 40-0.9 MEQ/L-% IV SOLN
INTRAVENOUS | Status: DC
  Filled 2020-08-29 (×5): qty 1000

## 2020-08-29 MED ORDER — HYDROCODONE-ACETAMINOPHEN 5-325 MG PO TABS
1.0000 | ORAL_TABLET | ORAL | Status: DC | PRN
  Administered 2020-08-29 – 2020-09-02 (×12): 2 via ORAL
  Filled 2020-08-29 (×13): qty 2

## 2020-08-29 MED ORDER — SODIUM CHLORIDE 0.9 % IV SOLN
1.0000 g | INTRAVENOUS | Status: DC
  Administered 2020-08-29 – 2020-09-01 (×4): 1 g via INTRAVENOUS
  Filled 2020-08-29: qty 10
  Filled 2020-08-29: qty 1
  Filled 2020-08-29 (×3): qty 10

## 2020-08-29 MED ORDER — ONDANSETRON HCL 4 MG/2ML IJ SOLN
4.0000 mg | Freq: Once | INTRAMUSCULAR | Status: AC
  Administered 2020-08-29: 4 mg via INTRAVENOUS
  Filled 2020-08-29: qty 2

## 2020-08-29 MED ORDER — CHLORHEXIDINE GLUCONATE CLOTH 2 % EX PADS
6.0000 | MEDICATED_PAD | Freq: Every day | CUTANEOUS | Status: DC
  Administered 2020-08-29 – 2020-09-02 (×4): 6 via TOPICAL

## 2020-08-29 MED ORDER — ACETAMINOPHEN 650 MG RE SUPP: 650.0000 mg | Freq: Four times a day (QID) | RECTAL | Status: DC | PRN

## 2020-08-29 MED ORDER — MORPHINE SULFATE (PF) 2 MG/ML IV SOLN: 2.0000 mg | INTRAVENOUS | Status: DC | PRN

## 2020-08-29 MED ORDER — ENOXAPARIN SODIUM 40 MG/0.4ML IJ SOSY
40.0000 mg | PREFILLED_SYRINGE | INTRAMUSCULAR | Status: DC
  Filled 2020-08-29: qty 0.4

## 2020-08-29 MED ORDER — ONDANSETRON HCL 4 MG PO TABS
4.0000 mg | ORAL_TABLET | Freq: Four times a day (QID) | ORAL | Status: DC | PRN
  Administered 2020-08-29 – 2020-08-30 (×4): 4 mg via ORAL
  Filled 2020-08-29 (×4): qty 1

## 2020-08-29 MED ORDER — ACETAMINOPHEN 325 MG PO TABS: 650.0000 mg | ORAL_TABLET | Freq: Four times a day (QID) | ORAL | Status: DC | PRN

## 2020-08-29 NOTE — Plan of Care (Signed)

## 2020-08-29 NOTE — Consult Note (Signed)
WOC Nurse Consult Note: Reason for Consult: bilateral ischial and sacral wounds. Known to WOC team from previous admissions. Last evaluation 06/27/20. Patient has chronic wounds with chronic osteomyelitis.  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 Has surgery for same some time soon per notes at outside facility where he is followed for same. Paraplegic who cares for wounds independently at home.  Wound type: Chronic Non-healing Stage 4 Pressure Injury: sacrum Chronic Non-healing Stage 4 Pressure injury: left ischium Chronic Non-healing Stage 4 Pressure injury: right ischium Pressure Injury POA: Yes Measurement: Sacrum: 4cm x 8cm x 0.2cm  Left ischium: 6cm x 6cm x 0.2cm  Right ischium: 8cm x 5cm x 0.5cm  Wound bed: all wounds are 100% clean, pink, non granular  Drainage (amount, consistency, odor) patient self reports urine that leaks from the bilateral ischial wounds. Otherwise serosanguinous drainage Periwound: intact, epibole of all wound edges Dressing procedure/placement/frequency: Clean with betadine: patient does this at home to decrease infection potential Cover with dry dressings. Secure with medifix tape.  Low air loss mattress for pressure redistribution and moisture management    Discussed POC with patient and bedside nurse.  Re consult if needed, will not follow at this time. Thanks  Demani Weyrauch [employer], RN,CWOCN, CNS, CWON-AP [phone])

## 2020-08-29 NOTE — H&P (Signed)
History and Physical    JACQUELYN ANTONY KNL:[id number] DOB: [birth year] DOA: 08/28/2020  PCP: Mick Sell, MD   Patient coming from: Home  I have personally briefly reviewed patient's old medical records in [facility]  Chief Complaint: Nausea, vomiting, abdominal pain  HPI: Jonathon York is a 45 y.o. 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  Patient was hospitalized for UTI in August 2021 presenting in a similar manner, growing 10,000 colonies of E faecalis.  Patient denies diarrhea, fever or chills, cough or shortness of breath ED course: On arrival afebrile, BP 113/84, pulse 108 with O2 sat 99% on room air.  Blood work significant for leukocytosis of 13,000 with lactic acid of 1.6.  Potassium 3.2.  Urinalysis with turbid urine, moderate leukocyte esterase and many bacteria Imaging: CT abdomen and pelvis showing mostly chronic findings including extensive decubitus ulceration chronic osteomyelitis, bilateral hip effusions and possible cystitis  Patient started on antibiotics and IV fluids.  And hospitalist consulted for admission  Review of Systems: As per HPI otherwise all other systems on review of systems negative.    Past Medical History:  Diagnosis Date  . Anxiety   . Decubitus ulcer   . Depression   . Neurogenic bladder   . Osteomyelitis (HCC)   . 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 AMPUTATION Right 03/19/2018   Procedure: 5th Metatarsal Resection;  Surgeon: Recardo Evangelist, DPM;  Location: [facility] ORS;  Service: Podiatry;  Laterality: Right;  . AMPUTATION TOE Right 04/29/2015    Procedure: AMPUTATION TOE;  Surgeon: Linus Galas, MD;  Location: [facility] ORS;  Service: Podiatry;  Laterality: Right;  . IRRIGATION AND DEBRIDEMENT BUTTOCKS    . TOE AMPUTATION Right      reports that he has quit smoking. His smoking use included cigarettes. He smoked 0.50 packs per day. He has never used smokeless tobacco. He reports current drug use. Drug: Marijuana. He reports that he does not drink alcohol.  Allergies  Allergen Reactions  . Orange Fruit [Citrus] Other (See Comments)    Blisters only from ORANGES!!! Patient is not allergic from all citrus  . Sulfa Antibiotics     Family History  Problem Relation Age of Onset  . Lymphoma Sister       Prior to Admission medications   Medication Sig Start Date End Date Taking? Authorizing Provider  feeding supplement, ENSURE ENLIVE, (ENSURE ENLIVE) LIQD Take 237 mLs by mouth 3 (three) times daily between meals. 12/05/19   Briant Cedar, MD  ferrous sulfate 325 (65 FE) MG tablet Take 1 tablet (325 mg total) by mouth daily with breakfast. Patient not taking: Reported on 06/27/2020 12/05/19 01/04/20  Briant Cedar, MD  HYDROcodone-acetaminophen (NORCO/VICODIN) 5-325 MG tablet Take 1 tablet by mouth every 6 (six) hours as needed for moderate pain. Patient not taking: No sig reported 10/28/19   Enedina Finner, MD  ibuprofen (ADVIL) 200 MG tablet Take by mouth. Patient not taking: Reported on 06/27/2020    [provider]  ondansetron (ZOFRAN) 4 MG tablet Take 4 mg by mouth every 6 (six) hours as needed for nausea or vomiting. Patient not taking: No sig  reported    [provider]  sodium hypochlorite (DAKIN'S 1/4 STRENGTH) 0.125 % SOLN Irrigate with 1 application as directed every morning. Patient not taking: Reported on 06/27/2020 12/05/19   Briant Cedar, MD    Physical Exam: Vitals:   08/28/20 2315 08/28/20 2330 08/29/20 0000 08/29/20 0030  BP: 111/78 123/73 117/71 124/68  Pulse: 84 77 73 69  Resp: 15 13 16 14    Temp:      TempSrc:      SpO2: 99% 95% 99% 100%  Weight:      Height:         Vitals:   08/28/20 2315 08/28/20 2330 08/29/20 0000 08/29/20 0030  BP: 111/78 123/73 117/71 124/68  Pulse: 84 77 73 69  Resp: 15 13 16 14   Temp:      TempSrc:      SpO2: 99% 95% 99% 100%  Weight:      Height:          Constitutional:  Chronically ill-appearing ,alert and oriented x 3 . HEENT:      Head: Normocephalic and atraumatic.         Eyes: PERLA, EOMI, Conjunctivae are normal. Sclera is non-icteric.       Mouth/Throat: Mucous membranes are moist.       Neck: Supple with no signs of meningismus. Cardiovascular:  Tachycardic. No murmurs, gallops, or rubs. 2+ symmetrical distal pulses are present . No JVD. No LE edema Respiratory: Respiratory effort normal .Lungs sounds clear bilaterally. No wheezes, crackles, or rhonchi.  Gastrointestinal: Soft, epigastric tenderness and non distended with positive bowel sounds.  Genitourinary: No CVA tenderness. Musculoskeletal: Nontender, paraplegic with disuse atrophy lower extremities Neurologic:  Face is symmetric.  Paraplegic  skin:  Sacral and ischial stage IV ulcers Psychiatric: Mood and affect are normal    Labs on Admission: I have personally reviewed following labs and imaging studies  CBC: Recent Labs  Lab 08/28/20 2111  WBC 13.0*  NEUTROABS 10.7*  HGB 11.9*  HCT 39.1  MCV 71.6*  PLT 514*   Basic Metabolic Panel: Recent Labs  Lab 08/28/20 2111  NA 137  K 3.2*  CL 97*  CO2 26  GLUCOSE 115*  BUN 13  CREATININE 0.54*  CALCIUM 8.9  MG 2.0   GFR: Estimated Creatinine Clearance: 74.9 mL/min (A) (by C-G formula based on SCr of 0.54 mg/dL (L)). Liver Function Tests: Recent Labs  Lab 08/28/20 2111  AST 15  ALT 11  ALKPHOS 70  BILITOT 0.9  PROT 7.7  ALBUMIN 3.6   No results for input(s): LIPASE, AMYLASE in the last 168 hours. No results for input(s): AMMONIA in the last 168 hours. Coagulation Profile: No results for  input(s): INR, PROTIME in the last 168 hours. Cardiac Enzymes: No results for input(s): CKTOTAL, CKMB, CKMBINDEX, TROPONINI in the last 168 hours. BNP (last 3 results) No results for input(s): PROBNP in the last 8760 hours. HbA1C: No results for input(s): HGBA1C in the last 72 hours. CBG: No results for input(s): GLUCAP in the last 168 hours. Lipid Profile: No results for input(s): CHOL, HDL, LDLCALC, TRIG, CHOLHDL, LDLDIRECT in the last 72 hours. Thyroid Function Tests: No results for input(s): TSH, T4TOTAL, FREET4, T3FREE, THYROIDAB in the last 72 hours. Anemia Panel: No results for input(s): VITAMINB12, FOLATE, FERRITIN, TIBC, IRON, RETICCTPCT in the last 72 hours. Urine analysis:    Component Value Date/Time   COLORURINE AMBER (A) 08/28/2020 2231   APPEARANCEUR TURBID (A) 08/28/2020 2231   APPEARANCEUR  Turbid 12/24/2013 0313   LABSPEC 1.026 08/28/2020 2231   LABSPEC 1.021 12/24/2013 0313   PHURINE 8.0 08/28/2020 2231   GLUCOSEU NEGATIVE 08/28/2020 2231   GLUCOSEU Negative 12/24/2013 0313   HGBUR NEGATIVE 08/28/2020 2231   BILIRUBINUR NEGATIVE 08/28/2020 2231   BILIRUBINUR Negative 12/24/2013 0313   KETONESUR 5 (A) 08/28/2020 2231   PROTEINUR 100 (A) 08/28/2020 2231   NITRITE NEGATIVE 08/28/2020 2231   LEUKOCYTESUR MODERATE (A) 08/28/2020 2231   LEUKOCYTESUR 1+ 12/24/2013 0313    Radiological Exams on Admission: CT ABDOMEN PELVIS W CONTRAST  Result Date: 08/29/2020 CLINICAL DATA:  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 Para paresis of both lower extremities. Neurogenic bladder. Prior osteomyelitis a 5 King shin at EXAM: CT ABDOMEN AND PELVIS WITH CONTRAST TECHNIQUE: Multidetector CT imaging of the abdomen and pelvis was performed using the standard protocol following bolus administration of intravenous contrast. CONTRAST:  100mL OMNIPAQUE IOHEXOL 300 MG/ML  SOLN COMPARISON:  CT 06/26/2020 FINDINGS: Lower chest: Lung bases are clear. Normal heart  size. No pericardial effusion. Pectus deformity of the chest. Hepatobiliary: Small subcentimeter hypoattenuating foci in the right lobe liver, too small to fully characterize on CT imaging but statistically likely benign. No concerning focal liver lesion. Smooth liver surface contour. Normal gallbladder and biliary tree. Pancreas: No pancreatic ductal dilatation or surrounding inflammatory changes. Spleen: Normal in size. No concerning splenic lesions. Adrenals/Urinary Tract: Normal adrenal glands. Multiple fluid attenuation cysts present in both kidneys with few additional smaller subcentimeter hypoattenuating foci too small to characterize though statistically likely benign. No concerning focal renal lesion. Kidneys enhance and excrete symmetrically. No urolithiasis or hydronephrosis. Urinary bladder is circumferentially thickened with intraluminal gas. Inflated Foley catheter is in place. Redemonstration of direct cutaneous fistulization from decubitus ulceration to portion of the urethra (2/81). Stomach/Bowel: Distal esophagus, stomach and duodenum are unremarkable. No large or small bowel thickening or dilatation. No evidence of obstruction. Normal appendix. There is significant rectal wall thickening contiguous with some ulceration and skin thickening around the anal verge Vascular/Lymphatic: Atherosclerotic calcifications within the abdominal aorta and branch vessels. No aneurysm or ectasia. No pathologically enlarged abdominopelvic lymph nodes. Reactive appearing adenopathy noted in the bilateral inguinal chains and along the pelvic sidewalls and mesorectal fat. Reproductive: Enlarged prostate with heterogeneous enhancement. Seminal vesicles are unremarkable. Extensive ulceration of the pudendal tissues with directs cutaneous fistulization to the base of the scrotum and penile corpora (2/81). Few foci of gas noted along the urethra as well. Other: Extensive soft tissue ulceration superficial to the sacrum  and along the ischial tuberosities bilateral including more focal thickening and deep ulceration which extends into the pudendal soft tissues, to the base of the penis and scrotum with direct extension along the urethra. Musculoskeletal: Extensive sclerotic and destructive changes involving the bilateral is CT, right greater than left. Bilateral hip effusions are noted with synovial thickening and surrounding inflammatory change. Septic arthritis not fully excluded. Additional sclerotic changes and bony remodeling along the sacrum, posterior left ilium as well with overlying cutaneous thickening and decubitus ulceration. Thoracolumbar fusion is again noted with hardware spanning a T11 vertebral body fracture with cord compression, overall unchanged in configuration from comparison exam. No acute complication is seen. Long segment bony fusion of the posterior elements. IMPRESSION: Extensive decubitus ulceration across the sacrum as well as the bilateral ischial tuberosities with soft tissue thickening and subjacent regions of sclerotic and destructive osseous changes suggesting sequela of chronic and or remote osteomyelitis though some additional acute bony infection  may be present. Bilateral hip effusions with extensive synovial thickening are present as well, sterility of these hip collections is not ascertained on imaging alone. Could consider sampling. More focal region of direct cutaneous ulceration and soft tissue defect is seen extending from the right ischial ulceration into the pudendal tissues towards the base of the penis and scrotum with direct extension and involvement of what is likely the membranous portion of the urethra. Associated soft tissue gas is noted along the read through itself with intraluminal gas in the urinary bladder as well as significant circumferential bladder wall thickening likely reflecting a superimposed cystitis. Prostatomegaly with heterogeneous enhancement, possibly related to  contrast timing though a superimposed prostatitis is certainly likely given additional findings above. Electronically Signed   By: Kreg Shropshire M.D.   On: 08/29/2020 00:03   DG Chest Port 1 View  Result Date: 08/28/2020 CLINICAL DATA:  Nausea, vomiting, paraplegia, concern for sepsis EXAM: PORTABLE CHEST 1 VIEW COMPARISON:  03/18/2018 chest radiograph. FINDINGS: Partially visualized bilateral posterior spinal fusion hardware extending inferiorly from the midthoracic spine. Stable cardiomediastinal silhouette with normal heart size. No pneumothorax. No pleural effusion. No pulmonary edema. New tiny nodular 4 mm opacity in the peripheral lower right lung. Otherwise clear lungs. IMPRESSION: New tiny nodular 4 mm opacity in the peripheral lower right lung, cannot exclude a pulmonary nodule. Otherwise no active disease in the chest. Suggest short-term outpatient follow-up chest CT. Electronically Signed   By: Delbert Phenix M.D.   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   Complicated UTI  Neurogenic bladder with chronic indwelling Foley catheter - IV Rocephin - Follow cultures - Patient was tachycardic and with mild leukocytosis on admission but no other features of sepsis but will continue to monitor    Nausea, vomiting and abdominal pain - Suspecting related to  UTI - CT abdomen without any acute intra-abdominal and intrapelvic pathology to explain nausea vomiting and abdominal pain -Follow lipase.  LFTs were WNL - IV hydration and IV antiemetics and IV pain meds - Clear liquid diet advance as tolerated    Paraplegia (HCC)   Sacral decubitus ulcer, stage IV (HCC)   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   Cutaneous  fistula - Increased nursing assistance for transfers and supportive care - Wound care    DVT prophylaxis: Lovenox  Code Status: full code  Family Communication:  none  Disposition Plan: Back to previous home environment Consults called: none  Status:At the time of admission, it appears that the appropriate admission status for this patient is INPATIENT. This is judged to be reasonable and necessary in order to provide the required intensity of service to ensure the patient's safety given the presenting symptoms, physical exam findings, and initial radiographic and laboratory data in the context of their  Comorbid conditions.   Patient requires inpatient status due to high intensity of service, high risk for further deterioration and high frequency of surveillance required.   I certify that at the point of admission it is my clinical judgment that the patient will require inpatient hospital care spanning beyond 2 midnights     Andris Baumann MD [facility]     08/29/2020, 1:18 AM

## 2020-08-29 NOTE — Progress Notes (Signed)
Initial Nutrition Assessment  DOCUMENTATION CODES:  Non-severe (moderate) malnutrition in context of chronic illness  INTERVENTION:   Advance diet as tolerated  Boost Breeze po TID, each supplement provides 250 kcal and 9 grams of protein  Add ocuvite daily and 250mg  vitamin c BID to promote wound healing  Educated on weight gain and increased nutrition needs with wound healing  NUTRITION DIAGNOSIS:  Moderate Malnutrition related to chronic illness (wounds) as evidenced by mild fat depletion,mild muscle depletion.  GOAL:  Patient will meet greater than or equal to 90% of their needs  MONITOR:  PO intake,Supplement acceptance,Skin  REASON FOR ASSESSMENT:  Low Braden,Other (Comment),Malnutrition Screening Tool,Consult (low BMI) Assessment of nutrition requirement/status  ASSESSMENT:  Pt presented to ED with several days of nausea and vomiting at home. Hx of stage IV sacral decubitus wounds that pt reports increased pain to. Admitted 10/27/19-10/28/19, 12/01/19-12/05/19 and followed by RD at that time. PMH significant for Paraplegia with chronic decubitus ulcers and chronic osteomyelitis, neurogenic bladder with chronic indwelling Foley catheter  Pt resting in bed at the time of visit. On clear liquid diet but reports he has not attempted any liquids since admission. States nausea is some better. Pt reports that his appetite is "hit or miss" at home. States that typically his intake is ok until he starts to get sick or an infection, then he does not feel like eating. Pt reports that he is attempting to gain weight prior to an upcoming procedure scheduled later this month. Discussed ways to maximize kcal and protein in meals and snacks at home. Pt is familiar with nutrition supplements, agreeable to receiving. Will change to ensure enlive when diet is advanced. Pt prefers strawberry.   Relevant Continuous Infusions: . 0.9 % NaCl with KCl 40 mEq / L 100 mL/hr at 08/29/20 0220   Relevant PRN  Meds: ondansetron  Labs reviewed  NUTRITION - FOCUSED PHYSICAL EXAM: Flowsheet Row Most Recent Value  Orbital Region Mild depletion  Upper Arm Region No depletion  Thoracic and Lumbar Region Moderate depletion  Buccal Region Mild depletion  Temple Region Mild depletion  Clavicle Bone Region Mild depletion  Clavicle and Acromion Bone Region Mild depletion  Scapular Bone Region Mild depletion  Dorsal Hand Mild depletion  Patellar Region Severe depletion  Anterior Thigh Region Severe depletion  Posterior Calf Region Severe depletion  Edema (RD Assessment) None  Hair Reviewed  Eyes Reviewed  Mouth Reviewed  Skin Reviewed  Nails Reviewed     Diet Order:   Diet Order            Diet clear liquid Room service appropriate? Yes; Fluid consistency: Thin  Diet effective now                EDUCATION NEEDS:  Education needs have been addressed  Skin:  Skin Assessment: Skin Integrity Issues: Skin Integrity Issues:: Stage IV Stage IV: sacral ulcers  Last BM:  4/30 per RN documentation  Height:  Ht Readings from Last 1 Encounters:  08/29/20 5\' 10"  (1.778 m)   Weight:  Wt Readings from Last 1 Encounters:  08/29/20 45.4 kg    Ideal Body Weight:  75.5 kg  BMI:  Body mass index is 14.36 kg/m.  Estimated Nutritional Needs:   Kcal:  1800-2000 kcal  Protein:  900-110 g  Fluid:  > 2L/d   , RD, LDN Clinical Dietitian Pager on Amion

## 2020-08-29 NOTE — Progress Notes (Signed)
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  Recently hospitalized for UTI in August 2021 presenting in a similar manner in which was growing 10,000 K colonies of Enterococcus faecalis.  Patient denies diarrhea, no fever/chills, no cough or shortness of breath.  In the ED, temperature 98.4 F, HR 108, RR 18, BP 113/84, SPO2 99% on room air.  Sodium 137, potassium 3.2, chloride 97, CO2 26, glucose 115, BUN 13, creatinine 0.54, magnesium 2.0, lactic acid 1.6.  WBC 13.0, hemoglobin 11.9, platelets 514.  COVID-19 PCR negative.  Influenza A/B PCR negative.  Urinalysis with moderate leukocytes, negative nitrite, many bacteria, 11-20 WBCs.  CT abdomen/pelvis with contrast with findings of the extensive decubitus ulceration across sacrum with regions of sclerotic and destructive osseous changes, bilateral hip effusions, cutaneous laceration and soft tissue defect right ischial ulceration into pudendal tissues at the base of penis and scrotum with direct extension involvement of urethra, and circumferential bladder wall thickening consistent with cystitis.  Patient was started on empiric IV antibiotics and IV fluids.  TRH consulted for further evaluation and management of nausea/vomiting and abdominal pain are likely secondary to urinary tract infection.   Assessment & Plan:   Principal Problem:   UTI (urinary tract infection) Active Problems:   Paraplegia (HCC)   Neurogenic bladder   Sacral decubitus ulcer, stage IV (HCC)   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   Chronic  indwelling Foley catheter   Nausea and vomiting   Cutaneous fistula   Urinary tract infection, complicated Neurogenic bladder with chronic indwelling Foley catheter Patient presenting to ED with nausea/vomiting with associated abdominal pain.  CT abdomen/pelvis with no acute intra-abdominal findings, but notable for chronic sacral/ischial tuberosity ulcers with osteomyelitis and cutaneous fistulous.  Urinalysis with large leukocytes, negative nitrite, many bacteria, greater than 50 WBCs --Urine culture: Pending --Ceftriaxone 1 g IV every 24 hours --Supportive care  Nausea/vomiting/abdominal pain Suspect etiology related to urinary tract infection.  CT abdomen/pelvis without any acute intra-abdominal or pelvic findings.  Lipase 30.  LFTs within normal limits. -- Continue IV fluid hydration -- Antiemetics -- Clear liquid diet, advance as tolerates  Paraplegia Sacral decubitus ulcer, stage IV, POA Bilateral ischial pressure ulcers, stage IV, POA Cutaneous fistula Chronic sacral osteomyelitis Patient states has upcoming surgery planned at [facility] for his cutaneous fistula followed by plastic surgery performing flap closure regarding his sacral/ischial decubitus ulcers. --Continue wound care --Offloading --Patient requesting PT/OT evaluation  Severe protein calorie malnutrition Body mass index is 14.36 kg/m.  Patient is thin and cachectic on physical exam with associated muscle wasting and fat depletion likely secondary to his underlying paraplegia and poor oral intake. -- Nutrition consultation for recommendations once diet is advanced    DVT prophylaxis: Lovenox   Code Status: Full Code Family Communication: No family present at bedside  Disposition Plan:  Level of care: Med-Surg Status is: Inpatient  Remains inpatient appropriate because:Ongoing diagnostic testing needed not appropriate for outpatient work up, Unsafe d/c plan, IV treatments appropriate due to intensity of illness  or inability to take PO and Inpatient level of care appropriate due to severity of illness  Dispo: The patient is from: Home              Anticipated d/c is to: Home              Patient currently is not medically stable to d/c.   Difficult to place patient No   Consultants:   None  Procedures:   None  Antimicrobials:   Ceftriaxone 5/3>>   Subjective: Patient seen and examined bedside, resting comfortably.  No further vomiting.  Continues with some mild nausea but much improved since presentation.  Feels tired and fatigued and would like to rest this morning.  Patient requesting evaluation by PT and OT given [facility] his cutaneous fistulas and sacral cubitus ulcers.  No other questions or concerns at this time.  Denies headache, no fever/chills/night sweats, no vomiting/diarrhea, no chest pain, no palpitations, no shortness of breath, no current abdominal pain.  No acute events overnight per nursing staff.  Objective: Vitals:   08/29/20 0247 08/29/20 0425 08/29/20 0456 08/29/20 0800  BP: 120/74 (!) 102/57  110/73  Pulse: 96 80  73  Resp: 18 18    Temp: 98.3 F (36.8 C) 98.5 F (36.9 C)  98 F (36.7 C)  TempSrc:    Oral  SpO2: 99% 99%  100%  Weight:   45.4 kg   Height:   5\' 10"  (1.778 m)     Intake/Output Summary (Last 24 hours) at 08/29/2020 1346 Last data filed at 08/29/2020 [id number] Gross per 24 hour  Intake 3235.1 ml  Output 400 ml  Net 2835.1 ml   Filed Weights   08/28/20 1958 08/29/20 0456  Weight: 45.4 kg 45.4 kg    Examination:  General exam: Appears calm and comfortable, thin/cachectic with apparent muscle wasting and fat depletion noted, chronically ill in appearance Respiratory system: Clear to auscultation. Respiratory effort normal.  On room air Cardiovascular system: S1 & S2 heard, RRR. No JVD, murmurs, rubs, gallops or clicks. No pedal edema. Gastrointestinal system: Abdomen is nondistended, soft and nontender. No  organomegaly or masses felt. Normal bowel sounds heard. GU: Foley catheter noted with clear yellow urine draining Central nervous system: Alert and oriented.  Paraplegic with diffuse atrophy bilateral lower extremities Extremities: Symmetric 5 x 5 power. Skin: Noted sacral/ischial stage IV ulcers Psychiatry: Judgement and insight appear normal. Mood & affect appropriate.     Data Reviewed: I have personally reviewed following labs and imaging studies  CBC: Recent Labs  Lab 08/28/20 2111 08/29/20 0533  WBC 13.0* 9.2  NEUTROABS 10.7*  --   HGB 11.9* 9.5*  HCT 39.1 31.3*  MCV 71.6* 72.0*  PLT 514* 362   Basic Metabolic Panel: Recent Labs  Lab 08/28/20 2111 08/29/20 0533  NA 137 138  K 3.2* 4.0  CL 97* 103  CO2 26 27  GLUCOSE 115* 112*  BUN 13 9  CREATININE 0.54* 0.47*  CALCIUM 8.9 8.2*  MG 2.0  --    GFR: Estimated Creatinine Clearance: 74.9 mL/min (A) (by C-G formula based on SCr of 0.47 mg/dL (L)). Liver Function Tests: Recent Labs  Lab 08/28/20 2111  AST 15  ALT 11  ALKPHOS 70  BILITOT 0.9  PROT 7.7  ALBUMIN 3.6   Recent Labs  Lab 08/29/20 0533  LIPASE 30   No results for input(s): AMMONIA in the last 168 hours. Coagulation Profile: No results for input(s): INR, PROTIME in the last 168 hours. Cardiac Enzymes: No results for input(s): CKTOTAL, CKMB, CKMBINDEX, TROPONINI  in the last 168 hours. BNP (last 3 results) No results for input(s): PROBNP in the last 8760 hours. HbA1C: No results for input(s): HGBA1C in the last 72 hours. CBG: No results for input(s): GLUCAP in the last 168 hours. Lipid Profile: No results for input(s): CHOL, HDL, LDLCALC, TRIG, CHOLHDL, LDLDIRECT in the last 72 hours. Thyroid Function Tests: No results for input(s): TSH, T4TOTAL, FREET4, T3FREE, THYROIDAB in the last 72 hours. Anemia Panel: No results for input(s): VITAMINB12, FOLATE, FERRITIN, TIBC, IRON, RETICCTPCT in the last 72 hours. Sepsis Labs: Recent Labs  Lab  08/28/20 2231  LATICACIDVEN 1.6    Recent Results (from the past 240 hour(s))  Resp Panel by RT-PCR (Flu A&B, Covid) Nasopharyngeal Swab     Status: None   Collection Time: 08/28/20 10:31 PM   Specimen: Nasopharyngeal Swab; Nasopharyngeal(NP) swabs in vial transport medium  Result Value Ref Range Status   SARS Coronavirus 2 by RT PCR NEGATIVE NEGATIVE Final    Comment: (NOTE) SARS-CoV-2 target nucleic acids are NOT DETECTED.  The SARS-CoV-2 RNA is generally detectable in upper respiratory specimens during the acute phase of infection. The lowest concentration of SARS-CoV-2 viral copies this assay can detect is 138 copies/mL. A negative result does not preclude SARS-Cov-2 infection and should not be used as the sole basis for treatment or other patient management decisions. A negative result may occur with  improper specimen collection/handling, submission of specimen other than nasopharyngeal swab, presence of viral mutation(s) within the areas targeted by this assay, and inadequate number of viral copies(<138 copies/mL). A negative result must be combined with clinical observations, patient history, and epidemiological information. The expected result is Negative.  Fact Sheet for Patients:  BloggerCourse.com  Fact Sheet for Healthcare Providers:  SeriousBroker.it  This test is no t yet approved or cleared by the [place] FDA and  has been authorized for detection and/or diagnosis of SARS-CoV-2 by FDA under an Emergency Use Authorization (EUA). This EUA will remain  in effect (meaning this test can be used) for the duration of the COVID-19 declaration under Section 564(b)(1) of the Act, 21 U.S.C.section 360bbb-3(b)(1), unless the authorization is terminated  or revoked sooner.       Influenza A by PCR NEGATIVE NEGATIVE Final   Influenza B by PCR NEGATIVE NEGATIVE Final    Comment: (NOTE) The Xpert Xpress  SARS-CoV-2/FLU/RSV plus assay is intended as an aid in the diagnosis of influenza from Nasopharyngeal swab specimens and should not be used as a sole basis for treatment. Nasal washings and aspirates are unacceptable for Xpert Xpress SARS-CoV-2/FLU/RSV testing.  Fact Sheet for Patients: BloggerCourse.com  Fact Sheet for Healthcare Providers: SeriousBroker.it  This test is not yet approved or cleared by the [place] FDA and has been authorized for detection and/or diagnosis of SARS-CoV-2 by FDA under an Emergency Use Authorization (EUA). This EUA will remain in effect (meaning this test can be used) for the duration of the COVID-19 declaration under Section 564(b)(1) of the Act, 21 U.S.C. section 360bbb-3(b)(1), unless the authorization is terminated or revoked.  Performed at [facility], [address] [place], [place] [address]   Blood culture (routine single)     Status: None (Preliminary result)   Collection Time: 08/28/20 10:31 PM   Specimen: BLOOD  Result Value Ref Range Status   Specimen Description BLOOD RIGHT ANTECUBITAL  Final   Special Requests   Final    BOTTLES DRAWN AEROBIC AND ANAEROBIC Blood Culture adequate volume   Culture  Final    NO GROWTH < 12 HOURS Performed at [facility], [address] [place], [place] [address]    Report Status PENDING  Incomplete         Radiology Studies: CT ABDOMEN PELVIS W CONTRAST  Result Date: 08/29/2020 CLINICAL DATA:  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 Para paresis of both lower extremities. Neurogenic bladder. Prior osteomyelitis a 5 [place] shin at EXAM: CT ABDOMEN AND PELVIS WITH CONTRAST TECHNIQUE: Multidetector CT imaging of the abdomen and pelvis was performed using the standard protocol following bolus administration of intravenous contrast. CONTRAST:  OMNIPAQUE IOHEXOL 300 MG/ML  SOLN COMPARISON:  CT  06/26/2020 FINDINGS: Lower chest: Lung bases are clear. Normal heart size. No pericardial effusion. Pectus deformity of the chest. Hepatobiliary: Small subcentimeter hypoattenuating foci in the right lobe liver, too small to fully characterize on CT imaging but statistically likely benign. No concerning focal liver lesion. Smooth liver surface contour. Normal gallbladder and biliary tree. Pancreas: No pancreatic ductal dilatation or surrounding inflammatory changes. Spleen: Normal in size. No concerning splenic lesions. Adrenals/Urinary Tract: Normal adrenal glands. Multiple fluid attenuation cysts present in both kidneys with few additional smaller subcentimeter hypoattenuating foci too small to characterize though statistically likely benign. No concerning focal renal lesion. Kidneys enhance and excrete symmetrically. No urolithiasis or hydronephrosis. Urinary bladder is circumferentially thickened with intraluminal gas. Inflated Foley catheter is in place. Redemonstration of direct cutaneous fistulization from decubitus ulceration to portion of the urethra (2/81). Stomach/Bowel: Distal esophagus, stomach and duodenum are unremarkable. No large or small bowel thickening or dilatation. No evidence of obstruction. Normal appendix. There is significant rectal wall thickening contiguous with some ulceration and skin thickening around the anal verge Vascular/Lymphatic: Atherosclerotic calcifications within the abdominal aorta and branch vessels. No aneurysm or ectasia. No pathologically enlarged abdominopelvic lymph nodes. Reactive appearing adenopathy noted in the bilateral inguinal chains and along the pelvic sidewalls and mesorectal fat. Reproductive: Enlarged prostate with heterogeneous enhancement. Seminal vesicles are unremarkable. Extensive ulceration of the pudendal tissues with directs cutaneous fistulization to the base of the scrotum and penile corpora (2/81). Few foci of gas noted along the urethra as well.  Other: Extensive soft tissue ulceration superficial to the sacrum and along the ischial tuberosities bilateral including more focal thickening and deep ulceration which extends into the pudendal soft tissues, to the base of the penis and scrotum with direct extension along the urethra. Musculoskeletal: Extensive sclerotic and destructive changes involving the bilateral is CT, right greater than left. Bilateral hip effusions are noted with synovial thickening and surrounding inflammatory change. Septic arthritis not fully excluded. Additional sclerotic changes and bony remodeling along the sacrum, posterior left ilium as well with overlying cutaneous thickening and decubitus ulceration. Thoracolumbar fusion is again noted with hardware spanning a T11 vertebral body fracture with cord compression, overall unchanged in configuration from comparison exam. No acute complication is seen. Long segment bony fusion of the posterior elements. IMPRESSION: Extensive decubitus ulceration across the sacrum as well as the bilateral ischial tuberosities with soft tissue thickening and subjacent regions of sclerotic and destructive osseous changes suggesting sequela of chronic and or remote osteomyelitis though some additional acute bony infection may be present. Bilateral hip effusions with extensive synovial thickening are present as well, sterility of these hip collections is not ascertained on imaging alone. Could consider sampling. More focal region of direct cutaneous ulceration and soft tissue defect is seen extending from the right ischial ulceration into the pudendal tissues towards  the base of the penis and scrotum with direct extension and involvement of what is likely the membranous portion of the urethra. Associated soft tissue gas is noted along the read through itself with intraluminal gas in the urinary bladder as well as significant circumferential bladder wall thickening likely reflecting a superimposed cystitis.  Prostatomegaly with heterogeneous enhancement, possibly related to contrast timing though a superimposed prostatitis is certainly likely given additional findings above. Electronically Signed   By: Kreg Shropshire M.D.   On: 08/29/2020 00:03   DG Chest Port 1 View  Result Date: 08/28/2020 CLINICAL DATA:  Nausea, vomiting, paraplegia, concern for sepsis EXAM: PORTABLE CHEST 1 VIEW COMPARISON:  03/18/2018 chest radiograph. FINDINGS: Partially visualized bilateral posterior spinal fusion hardware extending inferiorly from the midthoracic spine. Stable cardiomediastinal silhouette with normal heart size. No pneumothorax. No pleural effusion. No pulmonary edema. New tiny nodular 4 mm opacity in the peripheral lower right lung. Otherwise clear lungs. IMPRESSION: New tiny nodular 4 mm opacity in the peripheral lower right lung, cannot exclude a pulmonary nodule. Otherwise no active disease in the chest. Suggest short-term outpatient follow-up chest CT. Electronically Signed   By: Delbert Phenix M.D.   On: 08/28/2020 22:03        Scheduled Meds: . vitamin C  250 mg Oral BID  . Chlorhexidine Gluconate Cloth  6 each Topical Daily  . enoxaparin (LOVENOX) injection  40 mg Subcutaneous Q24H  . feeding supplement  1 Container Oral TID BM  . multivitamin-lutein  1 capsule Oral Daily   Continuous Infusions: . 0.9 % NaCl with KCl 40 mEq / L 100 mL/hr at 08/29/20 1323  . cefTRIAXone (ROCEPHIN)  IV       LOS: 0 days    Time spent: 46 minutes spent on chart review, discussion with nursing staff, consultants, updating family and interview/physical exam; more than 50% of that time was spent in counseling and/or coordination of care.    Alvira Philips [place], DO [facility] Available via Epic secure chat 7am-7pm After these hours, please refer to coverage provider listed on [email or web address] 08/29/2020, 1:46 PM

## 2020-08-29 NOTE — ED Provider Notes (Signed)
-----------------------------------------   12:23 AM on 08/29/2020 -----------------------------------------  Patient continues with pain and nausea.  Updated patient of CT scan.  Will repeat IV morphine and Zofran.  IV Rocephin ordered for cystitis.  Will discuss with hospitalist services for admission.   CT abdomen/pelvis interpreted per Dr. Elvera Maria:  Extensive decubitus ulceration across the sacrum as well as the  bilateral ischial tuberosities with soft tissue thickening and  subjacent regions of sclerotic and destructive osseous changes  suggesting sequela of chronic and or remote osteomyelitis though  some additional acute bony infection may be present. Bilateral hip  effusions with extensive synovial thickening are present as well,  sterility of these hip collections is not ascertained on imaging  alone. Could consider sampling.    More focal region of direct cutaneous ulceration and soft tissue  defect is seen extending from the right ischial ulceration into the  pudendal tissues towards the base of the penis and scrotum with  direct extension and involvement of what is likely the membranous  portion of the urethra. Associated soft tissue gas is noted along  the read through itself with intraluminal gas in the urinary bladder  as well as significant circumferential bladder wall thickening  likely reflecting a superimposed cystitis.    Prostatomegaly with heterogeneous enhancement, possibly related to  contrast timing though a superimposed prostatitis is certainly  likely given additional findings above.      Irean Hong, MD 08/29/20 [phone]

## 2020-08-30 DIAGNOSIS — E43 Unspecified severe protein-calorie malnutrition: Secondary | ICD-10-CM

## 2020-08-30 DIAGNOSIS — N390 Urinary tract infection, site not specified: Secondary | ICD-10-CM | POA: Diagnosis not present

## 2020-08-30 DIAGNOSIS — N360 Urethral fistula: Secondary | ICD-10-CM

## 2020-08-30 DIAGNOSIS — N401 Enlarged prostate with lower urinary tract symptoms: Secondary | ICD-10-CM

## 2020-08-30 DIAGNOSIS — L988 Other specified disorders of the skin and subcutaneous tissue: Secondary | ICD-10-CM | POA: Diagnosis not present

## 2020-08-30 LAB — COMPREHENSIVE METABOLIC PANEL
ALT: 8 U/L (ref 0–44)
AST: 8 U/L — ABNORMAL LOW (ref 15–41)
Albumin: 2.8 g/dL — ABNORMAL LOW (ref 3.5–5.0)
Alkaline Phosphatase: 56 U/L (ref 38–126)
Anion gap: 6 (ref 5–15)
BUN: <5 mg/dL — ABNORMAL LOW (ref 6–20)
CO2: 23 mmol/L (ref 22–32)
Calcium: 8.4 mg/dL — ABNORMAL LOW (ref 8.9–10.3)
Chloride: 107 mmol/L (ref 98–111)
Creatinine, Ser: 0.39 mg/dL — ABNORMAL LOW (ref 0.61–1.24)
GFR, Estimated: >60 mL/min (ref >60)
Glucose, Bld: 85 mg/dL (ref 70–99)
Potassium: 4.3 mmol/L (ref 3.5–5.1)
Sodium: 136 mmol/L (ref 135–145)
Total Bilirubin: 0.5 mg/dL (ref 0.3–1.2)
Total Protein: 6 g/dL — ABNORMAL LOW (ref 6.5–8.1)

## 2020-08-30 LAB — CBC
HCT: 30.9 % — ABNORMAL LOW (ref 39.0–52.0)
Hemoglobin: 9.1 g/dL — ABNORMAL LOW (ref 13.0–17.0)
MCH: 21.6 pg — ABNORMAL LOW (ref 26.0–34.0)
MCHC: 29.4 g/dL — ABNORMAL LOW (ref 30.0–36.0)
MCV: 73.2 fL — ABNORMAL LOW (ref 80.0–100.0)
Platelets: 295 10*3/uL (ref 150–400)
RBC: 4.22 MIL/uL (ref 4.22–5.81)
RDW: 18.6 % — ABNORMAL HIGH (ref 11.5–15.5)
WBC: 7.6 10*3/uL (ref 4.0–10.5)
nRBC: 0 % (ref 0.0–0.2)

## 2020-08-30 LAB — URINE CULTURE

## 2020-08-30 NOTE — Consult Note (Addendum)
Urology Consult  I have been asked to see the patient by Dr. Mayford Knife, for evaluation and management of possible fistula involving the bladder and sacral decubitus ulcer.  Chief Complaint: nausea, vomiting, abdominal pain  History of Present Illness: Jonathon York is a 45 y.o. 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  Admission urine cultures positive for multiple species, blood cultures pending with no growth at 2 days.  On antibiotics as below.  He underwent CTAP with contrast which revealed extensive decubitus ulcers with sacral and ischial changes consistent with chronic osteomyelitis, ulceration extending into the pudendal tissue toward the base of the penis and scrotum with likely involvement of the membranous urethra, intraluminal bladder and soft tissue gas consistent with cystitis, and heterogeneous enhancement of the prostate consistent with prostatitis.  Foley balloon is noted appropriately inflated within the bladder.  Patient's RN attempted Foley replacement at the bedside today. She advanced the catheter and reports no immediate urinary return. Patient informed her that this is typical for him when he performs his own catheter exchanges at home. Patient was subsequently turned, which revealed the Foley tip and inflated balloon emanating from his right ischial wound. Foley was subsequently removed and he remains without a catheter x2 hours.  Drs. Apolinar Junes and Brandt have consulted on this patient previously, most recently on 07/14/2019 with the same concern of urine emanating from the sacral wound.  He was noted to have complex erosion of the proximal urethra at that time and SP tube was recommended but never placed.  We have attempted to refer him to [facility] on multiple occasions for reconstructive urologic care, however patient  cancelled both appointments made on 09/14/19 and 12/09/19 and has not attempted to reschedule them. He is scheduled to see Dr. Raoul Pitch at [facility] in consultation for neurogenic bladder and urethrocutaneous fistula on 09/21/2020.  Anti-infectives (From admission, onward)   Start     Dose/Rate Route Frequency Ordered Stop   08/29/20 2200  cefTRIAXone (ROCEPHIN) 1 g in sodium chloride 0.9 % 100 mL IVPB        1 g 200 mL/hr over 30 Minutes Intravenous Every 24 hours 08/29/20 0935     08/29/20 0000  cefTRIAXone (ROCEPHIN) 1 g in sodium chloride 0.9 % 100 mL IVPB        1 g 200 mL/hr over 30 Minutes Intravenous  Once 08/28/20 2357 08/29/20 0206      Past Medical History:  Diagnosis Date  . Anxiety   . Decubitus ulcer   . Depression   . Neurogenic bladder   . Osteomyelitis (HCC)   . 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 AMPUTATION Right 03/19/2018   Procedure: 5th Metatarsal Resection;  Surgeon: Recardo Evangelist, DPM;  Location: [facility] ORS;  Service: Podiatry;  Laterality: Right;  . 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 IRRIGATION AND DEBRIDEMENT BUTTOCKS    . TOE AMPUTATION Right     Home Medications:  No outpatient medications have been marked as taking for the 08/28/20 encounter [facility] Encounter).    Allergies:  Allergies  Allergen Reactions  . Orange Fruit [Citrus] Other (See Comments)    Blisters only from ORANGES!!! Patient is not allergic from all  citrus  . Sulfa Antibiotics     Throwing up blood     Family History  Problem Relation Age of Onset  . Lymphoma Sister     Social History:  reports that he has quit smoking. His smoking use included cigarettes. He smoked 0.50 packs per day. He has never used smokeless tobacco. He reports current drug use. Drug: Marijuana. He reports that he does not drink  alcohol.  ROS: A complete review of systems was performed.  All systems are negative except for pertinent findings as noted.  Physical Exam:  Vital signs in last 24 hours: Temp:  [98.2 F (36.8 C)-98.7 F (37.1 C)] 98.7 F (37.1 C) (05/04 1226) Pulse Rate:  [66-79] 79 (05/04 1226) Resp:  [15-18] 17 (05/04 1226) BP: (98-111)/(54-68) 100/56 (05/04 1226) SpO2:  [97 %-100 %] 99 % (05/04 1226) Constitutional:  Alert and oriented, cachectic, no acute distress HEENT: [place] AT, moist mucus membranes Cardiovascular: No clubbing, cyanosis, or edema Respiratory: Normal respiratory effort GU: Intact penile skin with no urethral erosion noted. Skin: Right ischial decubitus wound visualized. Wound is moist consistent with leaking urine, but bladder is not visualized at the wound base. Neurologic: Grossly intact, no focal deficits, moving all 4 extremities Psychiatric: Normal mood and affect  Laboratory Data:  Recent Labs    08/28/20 2111 08/29/20 0533 08/30/20 0523  WBC 13.0* 9.2 7.6  HGB 11.9* 9.5* 9.1*  HCT 39.1 31.3* 30.9*   Recent Labs    08/28/20 2111 08/29/20 0533 08/30/20 0523  NA 137 138 136  K 3.2* 4.0 4.3  CL 97* 103 107  CO2 26 27 23   GLUCOSE 115* 112* 85  BUN 13 9 <5*  CREATININE 0.54* 0.47* 0.39*  CALCIUM 8.9 8.2* 8.4*   Urinalysis    Component Value Date/Time   COLORURINE AMBER (A) 08/28/2020 2231   APPEARANCEUR TURBID (A) 08/28/2020 2231   APPEARANCEUR Turbid 12/24/2013 0313   LABSPEC 1.026 08/28/2020 2231   LABSPEC 1.021 12/24/2013 0313   PHURINE 8.0 08/28/2020 2231   GLUCOSEU NEGATIVE 08/28/2020 2231   GLUCOSEU Negative 12/24/2013 0313   HGBUR NEGATIVE 08/28/2020 2231   BILIRUBINUR NEGATIVE 08/28/2020 2231   BILIRUBINUR Negative 12/24/2013 0313   KETONESUR 5 (A) 08/28/2020 2231   PROTEINUR 100 (A) 08/28/2020 2231   NITRITE NEGATIVE 08/28/2020 2231   LEUKOCYTESUR MODERATE (A) 08/28/2020 2231   LEUKOCYTESUR 1+ 12/24/2013 0313   Results for orders  placed or performed during the hospital encounter of 08/28/20  Resp Panel by RT-PCR (Flu A&B, Covid) Nasopharyngeal Swab     Status: None   Collection Time: 08/28/20 10:31 PM   Specimen: Nasopharyngeal Swab; Nasopharyngeal(NP) swabs in vial transport medium  Result Value Ref Range Status   SARS Coronavirus 2 by RT PCR NEGATIVE NEGATIVE Final    Comment: (NOTE) SARS-CoV-2 target nucleic acids are NOT DETECTED.  The SARS-CoV-2 RNA is generally detectable in upper respiratory specimens during the acute phase of infection. The lowest concentration of SARS-CoV-2 viral copies this assay can detect is 138 copies/mL. A negative result does not preclude SARS-Cov-2 infection and should not be used as the sole basis for treatment or other patient management decisions. A negative result may occur with  improper specimen collection/handling, submission of specimen other than nasopharyngeal swab, presence of viral mutation(s) within the areas targeted by this assay, and inadequate number of viral copies(<138 copies/mL). A negative result must be combined with clinical observations, patient history, and epidemiological information. The expected result is Negative.  Fact Sheet  for Patients:  BloggerCourse.comhttps://www.fda.gov/media/152166/download  Fact Sheet for Healthcare Providers:  SeriousBroker.ithttps://www.fda.gov/media/152162/download  This test is no t yet approved or cleared by the [place] FDA and  has been authorized for detection and/or diagnosis of SARS-CoV-2 by FDA under an Emergency Use Authorization (EUA). This EUA will remain  in effect (meaning this test can be used) for the duration of the COVID-19 declaration under Section 564(b)(1) of the Act, 21 U.S.C.section 360bbb-3(b)(1), unless the authorization is terminated  or revoked sooner.       Influenza A by PCR NEGATIVE NEGATIVE Final   Influenza B by PCR NEGATIVE NEGATIVE Final    Comment: (NOTE) The Xpert Xpress SARS-CoV-2/FLU/RSV plus assay is  intended as an aid in the diagnosis of influenza from Nasopharyngeal swab specimens and should not be used as a sole basis for treatment. Nasal washings and aspirates are unacceptable for Xpert Xpress SARS-CoV-2/FLU/RSV testing.  Fact Sheet for Patients: BloggerCourse.comhttps://www.fda.gov/media/152166/download  Fact Sheet for Healthcare Providers: SeriousBroker.ithttps://www.fda.gov/media/152162/download  This test is not yet approved or cleared by the [place] FDA and has been authorized for detection and/or diagnosis of SARS-CoV-2 by FDA under an Emergency Use Authorization (EUA). This EUA will remain in effect (meaning this test can be used) for the duration of the COVID-19 declaration under Section 564(b)(1) of the Act, 21 U.S.C. section 360bbb-3(b)(1), unless the authorization is terminated or revoked.  Performed at [facility], [address] [place], [place] [address]   Blood culture (routine single)     Status: None (Preliminary result)   Collection Time: 08/28/20 10:31 PM   Specimen: BLOOD  Result Value Ref Range Status   Specimen Description BLOOD RIGHT ANTECUBITAL  Final   Special Requests   Final    BOTTLES DRAWN AEROBIC AND ANAEROBIC Blood Culture adequate volume   Culture   Final    NO GROWTH 2 DAYS Performed at [facility], [address] [place], [place] [address]    Report Status PENDING  Incomplete  Urine culture     Status: Abnormal   Collection Time: 08/28/20 10:31 PM   Specimen: In/Out Cath Urine  Result Value Ref Range Status   Specimen Description   Final    IN/OUT CATH URINE Performed at [facility], [address] [place], [place] [address]    Special Requests   Final    NONE Performed at [facility], [address] [place], [place] [address]    Culture MULTIPLE SPECIES PRESENT, SUGGEST RECOLLECTION (A)  Final   Report Status 08/30/2020 FINAL  Final    Radiologic Imaging: CT ABDOMEN PELVIS W CONTRAST  Result Date:  08/29/2020 CLINICAL DATA:  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 Para paresis of both lower extremities. Neurogenic bladder. Prior osteomyelitis a 5 King shin at EXAM: CT ABDOMEN AND PELVIS WITH CONTRAST TECHNIQUE: Multidetector CT imaging of the abdomen and pelvis was performed using the standard protocol following bolus administration of intravenous contrast. CONTRAST:  100mL OMNIPAQUE IOHEXOL 300 MG/ML  SOLN COMPARISON:  CT 06/26/2020 FINDINGS: Lower chest: Lung bases are clear. Normal heart size. No pericardial effusion. Pectus deformity of the chest. Hepatobiliary: Small subcentimeter hypoattenuating foci in the right lobe liver, too small to fully characterize on CT imaging but statistically likely benign. No concerning focal liver lesion. Smooth liver surface contour. Normal gallbladder and biliary tree. Pancreas: No pancreatic ductal dilatation or surrounding inflammatory changes. Spleen: Normal in size. No concerning splenic lesions. Adrenals/Urinary Tract: Normal adrenal glands. Multiple fluid attenuation cysts  present in both kidneys with few additional smaller subcentimeter hypoattenuating foci too small to characterize though statistically likely benign. No concerning focal renal lesion. Kidneys enhance and excrete symmetrically. No urolithiasis or hydronephrosis. Urinary bladder is circumferentially thickened with intraluminal gas. Inflated Foley catheter is in place. Redemonstration of direct cutaneous fistulization from decubitus ulceration to portion of the urethra (2/81). Stomach/Bowel: Distal esophagus, stomach and duodenum are unremarkable. No large or small bowel thickening or dilatation. No evidence of obstruction. Normal appendix. There is significant rectal wall thickening contiguous with some ulceration and skin thickening around the anal verge Vascular/Lymphatic: Atherosclerotic calcifications within the abdominal aorta and branch vessels. No aneurysm or  ectasia. No pathologically enlarged abdominopelvic lymph nodes. Reactive appearing adenopathy noted in the bilateral inguinal chains and along the pelvic sidewalls and mesorectal fat. Reproductive: Enlarged prostate with heterogeneous enhancement. Seminal vesicles are unremarkable. Extensive ulceration of the pudendal tissues with directs cutaneous fistulization to the base of the scrotum and penile corpora (2/81). Few foci of gas noted along the urethra as well. Other: Extensive soft tissue ulceration superficial to the sacrum and along the ischial tuberosities bilateral including more focal thickening and deep ulceration which extends into the pudendal soft tissues, to the base of the penis and scrotum with direct extension along the urethra. Musculoskeletal: Extensive sclerotic and destructive changes involving the bilateral is CT, right greater than left. Bilateral hip effusions are noted with synovial thickening and surrounding inflammatory change. Septic arthritis not fully excluded. Additional sclerotic changes and bony remodeling along the sacrum, posterior left ilium as well with overlying cutaneous thickening and decubitus ulceration. Thoracolumbar fusion is again noted with hardware spanning a T11 vertebral body fracture with cord compression, overall unchanged in configuration from comparison exam. No acute complication is seen. Long segment bony fusion of the posterior elements. IMPRESSION: Extensive decubitus ulceration across the sacrum as well as the bilateral ischial tuberosities with soft tissue thickening and subjacent regions of sclerotic and destructive osseous changes suggesting sequela of chronic and or remote osteomyelitis though some additional acute bony infection may be present. Bilateral hip effusions with extensive synovial thickening are present as well, sterility of these hip collections is not ascertained on imaging alone. Could consider sampling. More focal region of direct cutaneous  ulceration and soft tissue defect is seen extending from the right ischial ulceration into the pudendal tissues towards the base of the penis and scrotum with direct extension and involvement of what is likely the membranous portion of the urethra. Associated soft tissue gas is noted along the read through itself with intraluminal gas in the urinary bladder as well as significant circumferential bladder wall thickening likely reflecting a superimposed cystitis. Prostatomegaly with heterogeneous enhancement, possibly related to contrast timing though a superimposed prostatitis is certainly likely given additional findings above. Electronically Signed   By: Kreg Shropshire M.D.   On: 08/29/2020 00:03   Assessment & Plan:  45 year old paraplegic male with chronic decubitus ulcers, chronic osteomyelitis, neurogenic bladder managed by chronic indwelling Foley catheter admitted for management of complicated UTI.  Patient failed Foley catheter exchange at the bedside today, with protrusion of the Foley tip and balloon through the posterior bladder and through his right ischial wound consistent with vesicocutaneous fistula.  We again recommend suprapubic catheter placement with IR today for management of neurogenic bladder.  Patient is in agreement with this plan. Order placed and I have spoken with IR, who will attempt to get him on the schedule today. Please hold today's dose of lovenox and  keep him NPO pending procedure. If IR moves procedure to tomorrow, please resume diet and make him NPO at midnight.  SPT is a temporizing measure and is not expected to resolve his underlying fistula.  He continues to require reconstructive urologic care but has failed to follow-up at a tertiary care center as previously referred on several occasions.  Agree with antibiotics for management of complicated UTI.  I will reach out to Dr. Raoul Pitch to update her regarding patient's status in anticipation of his upcoming appointment  with her later this month.  Thank you for involving me in this patient's care, please page with any further questions or concerns.  Carman Ching, PA-C 08/30/2020 12:38 PM

## 2020-08-30 NOTE — Progress Notes (Signed)
OT Cancellation Note  Patient Details Name: Jonathon York MRN: [id number] DOB: [birth year]   Cancelled Treatment:    Reason Eval/Treat Not Completed: OT screened, no needs identified, will sign off. Pt reports living alone and performing all functional transfers and self care himself. Pt declines OT intervention in the acute setting. He does have several questions regarding need for new wheelchair equipment based on needs. OT to SIGN OFF and complete order at this time.   Jackquline Denmark, MS, OTR/L , CBIS ascom [phone]  08/30/20, 9:43 AM   08/30/2020, 9:41 AM

## 2020-08-30 NOTE — Progress Notes (Signed)
RN attempted foley catheter exchange per order. RN inserted new foley catheter per protocol after discussion with MD about pts known sacral wounds and urethrocutaneous fistula. RN was able to insert catheter without resistance, but quickly noticed that catheter was exiting the pt through the fistula in the L ischial sacral wound. MD notified and RN received a call back to remove catheter. Catheter was removed by RN. Urology and Gen Surg consulted.

## 2020-08-30 NOTE — Consult Note (Addendum)
[facility] SURGICAL CONSULTATION NOTE (initial) - cpt: 35329   HISTORY OF PRESENT ILLNESS (HPI):  45 y.o. male presented to [facility] ED initially on 05/02 for evaluation of emesis. At time of presentation, patient reported a 2-3 day history of nausea and emesis as well as abdominal pain. Work up in the ED was concerning for tachycardia, mild leukocytosis to 13.1K, and CT abdomen/pelvis concerning for multiple chronic decubitus ulcerations and possible cystitis. He was ultimately admitted to the medicine service. He has a history of paraplegia and chronic sacral ulceration. He was last evaluated by our service in June of 2021 for these. At that time, wounds appeared healthy but he had begun to notice urine saturated these wounds. Around this time, he was also evaluated by urology here at this facility but given the complexity of this case was referred to a tertiary center [facility]) for evaluation for reconstruction. It does not appear he has not been seen there, but he does have an appointment on 05/26 with Dr. Raoul Pitch at [facility] for neurogenic bladder and urethrocutaneous fistula.    Surgery is consulted by hospitalist physician Dr. Fabienne Bruns, MD in this context for evaluation and management of sacral decubitus ulceration with urethrocutaneous fistula.   PAST MEDICAL HISTORY (PMH):  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     PAST SURGICAL HISTORY (PSH):  Past Surgical History:  Procedure Laterality Date   AMPUTATION Right 03/19/2018   Procedure: 5th Metatarsal Resection;  Surgeon: Recardo Evangelist, DPM;  Location: [facility] ORS;  Service: Podiatry;  Laterality: Right;   AMPUTATION TOE Right 04/29/2015   Procedure: AMPUTATION TOE;  Surgeon: Linus Galas, MD;  Location: [facility] ORS;  Service: Podiatry;  Laterality: Right;   IRRIGATION AND DEBRIDEMENT  BUTTOCKS     TOE AMPUTATION Right      MEDICATIONS:  Prior to Admission medications   Not on File     ALLERGIES:  Allergies  Allergen Reactions   Orange Fruit [Citrus] Other (See Comments)    Blisters only from ORANGES!!! Patient is not allergic from all citrus   Sulfa Antibiotics     Throwing up blood      SOCIAL HISTORY:  Social History   Socioeconomic History   Marital status: Single    Spouse name: Not on file   Number of children: Not on file   Years of education: Not on file   Highest education level: Not on file  Occupational History   Not on file  Tobacco Use   Smoking status: Former Smoker    Packs/day: 0.50    Types: Cigarettes   Smokeless tobacco: Never Used  Substance and Sexual Activity   Alcohol use: No   Drug use: Yes    Types: Marijuana    Comment: 2 days ago   Sexual activity: Never  Other Topics Concern   Not on file  Social History Narrative   Not on file   Social Determinants of Health   Financial Resource Strain: Not on file  Food Insecurity: Not on file  Transportation Needs: Not on file  Physical Activity: Not on file  Stress: Not on file  Social Connections: Not on file  Intimate Partner Violence: Not on file     FAMILY HISTORY:  Family History  Problem Relation Age of Onset   Lymphoma Sister  REVIEW OF SYSTEMS:  Review of Systems  Constitutional: Negative for chills and fever.  HENT: Negative for congestion and sore throat.   Cardiovascular: Negative for chest pain and palpitations.  Gastrointestinal: Negative for abdominal pain, nausea and vomiting.  Genitourinary:       + neurogenic bladder  Skin:       + sacral wound  All other systems reviewed and are negative.   VITAL SIGNS:  Temp:  [98.2 F (36.8 C)-98.7 F (37.1 C)] 98.7 F (37.1 C) (05/04 1226) Pulse Rate:  [66-79] 79 (05/04 1226) Resp:  [15-18] 17 (05/04 1226) BP: (98-111)/(54-68) 100/56 (05/04 1226) SpO2:  [97 %-100 %] 99 % (05/04 1226)      Height: 5\' 10"  (177.8 cm) Weight: 45.4 kg BMI (Calculated): 14.36   INTAKE/OUTPUT:  05/03 0701 - 05/04 0700 In: 0  Out: 580 [Urine:580]  PHYSICAL EXAM:  Physical Exam Vitals and nursing note reviewed. Exam conducted with a chaperone present.  Constitutional:      General: He is not in acute distress.    Appearance: Normal appearance. He is normal weight. He is not ill-appearing.  HENT:     Head: Normocephalic and atraumatic.  Pulmonary:     Effort: Pulmonary effort is normal. No respiratory distress.  Musculoskeletal:     Comments: Chronic atrophy to bilateral lower extremities secondary to paraplegia   Skin:    General: Skin is warm and dry.       Neurological:     General: No focal deficit present.     Mental Status: He is alert and oriented to person, place, and time.  Psychiatric:        Mood and Affect: Mood normal.        Behavior: Behavior normal.      Labs:  CBC Latest Ref Rng & Units 08/30/2020 08/29/2020 08/28/2020  WBC 4.0 - 10.5 K/uL 7.6 9.2 13.0(H)  Hemoglobin 13.0 - 17.0 g/dL 10/28/2020) 9.7(Q) 11.9(L)  Hematocrit 39.0 - 52.0 % 30.9(L) 31.3(L) 39.1  Platelets 150 - 400 K/uL 295 362 514(H)   CMP Latest Ref Rng & Units 08/30/2020 08/29/2020 08/28/2020  Glucose 70 - 99 mg/dL 85 10/28/2020) 193(X)  BUN 6 - 20 mg/dL 902(I) 9 13  Creatinine 0.61 - 1.24 mg/dL <0(X) 7.35(H) 2.99(M)  Sodium 135 - 145 mmol/L 136 138 137  Potassium 3.5 - 5.1 mmol/L 4.3 4.0 3.2(L)  Chloride 98 - 111 mmol/L 107 103 97(L)  CO2 22 - 32 mmol/L 23 27 26   Calcium 8.9 - 10.3 mg/dL 4.26(S) ) 8.9  Total Protein 6.5 - 8.1 g/dL 6.0(L) - 7.7  Total Bilirubin 0.3 - 1.2 mg/dL 0.5 - 0.9  Alkaline Phos 38 - 126 U/L 56 - 70  AST 15 - 41 U/L 8(L) - 15  ALT 0 - 44 U/L 8 - 11     Imaging studies:   CT Abdomen/Pelvis (08/29/2020) personally reviewed showing known extensive sacral  and ischial ulcerations with evidence suggesting urethrocutaneous fistula, and radiologist report reviewed below:   IMPRESSION: Extensive decubitus ulceration across the sacrum as well as the bilateral ischial tuberosities with soft tissue thickening and subjacent regions of sclerotic and destructive osseous changes suggesting sequela of chronic and or remote osteomyelitis though some additional acute bony infection may be present. Bilateral hip effusions with extensive synovial thickening are present as well, sterility of these hip collections is not ascertained on imaging alone. Could consider sampling.   More focal region of direct cutaneous ulceration and soft tissue defect is  seen extending from the right ischial ulceration into the pudendal tissues towards the base of the penis and scrotum with direct extension and involvement of what is likely the membranous portion of the urethra. Associated soft tissue gas is noted along the read through itself with intraluminal gas in the urinary bladder as well as significant circumferential bladder wall thickening likely reflecting a superimposed cystitis.   Prostatomegaly with heterogeneous enhancement, possibly related to contrast timing though a superimposed prostatitis is certainly likely given additional findings above.   Assessment/Plan: (ICD-10's: L89.154) 45 y.o. 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   - Unfortunately, there is not much to add from a general surgery perspective and there is no evidence of necrosis or active infection of these wounds on examination. He is scheduled to urology at [facility] later this month, which is appropriate. His only concern is maintaining urinary management device (ie: foley, suprapubic catheter, etc...) until that time   - general surgery will sign off; please call with questions/concerns   All of the above findings and recommendations were discussed with the patient, and all of  patient's questions were answered to his expressed satisfaction.  Thank you for the opportunity to participate in this patient's care.   -- Lynden Oxford, PA-C [facility] 08/30/2020, 12:45 PM [phone] M-F: 7am - 4pm  I saw and evaluated the patient.  I agree with the above documentation, exam, and plan, which I have edited where appropriate. Devone Bonilla  1:32 PM

## 2020-08-30 NOTE — Progress Notes (Signed)
PT Cancellation Note  Patient Details Name: Jonathon York MRN: [id number] DOB: 03-29-1976   Cancelled Treatment:    Reason Eval/Treat Not Completed: PT screened, no needs identified, will sign off. Patient reports he is fully independent at home. No PT needs identified.    Zyire Eidson 08/30/2020, 10:26 AM

## 2020-08-30 NOTE — Plan of Care (Signed)

## 2020-08-30 NOTE — TOC Progression Note (Addendum)
Transition of Care [facility]) - Progression Note    Patient Details  Name: Jonathon York MRN: [id number] Date of Birth: 01-Apr-1976  Transition of Care [facility]) CM/SW [place], RN Phone Number: 08/30/2020, 3:33 PM  Clinical Narrative:    Met with the patient to discuss DC plan and needs, he lives home alone, he is in a wheelchair at [place] and requested for [place] to get him a new wheelchair prior to DC. I explained to him the type of wheelchair that we get in the hospital is not the same kind that he has currently, he stated that he would like to see the kind that we can get, I notified Rhonda at [facility], she brought the wheelchair to the room and he stated that is not want he wants, Suanne Marker at [facility] notified me that she will continue to try to see if they have a different option prior to DC,  The patient stated that he just needs a script for a wheelchair and he will get it himself after DC,  The patient has chronic Sacral decubitus that he has been caring for on his own, He has [facility] with [facility]. I requested Corene Cornea with [facility] to look to see if they can accept the patient back, he notified me that they would not be able to accept the patient due to non compliance issues in the past., I explained to the patient that [facility] would not be able to accept the patient, he stated that he does not want or need Home health CM will continue to monitor for needs and assist.        Expected Discharge Plan and Services                                                 Social Determinants of Health (SDOH) Interventions    Readmission Risk Interventions No flowsheet data found.

## 2020-08-30 NOTE — Progress Notes (Signed)
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  Continue on IV ceftriaxone.   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 Foley exchange attempted today but foley end came out of sacral wound which was removed. Will likely need a suprapubic cath. Urology & general surg consulted. Continue w/ wound care. Upcoming surgery planned at [facility] for cutaneous fistula followed by plastic surg for flap closure regarding sacral/ischial decubitus ulcers as per previous hospitalist  Paraplegia: continue w/ supportive care.   Severe protein calorie malnutrition: continue w/ nutritional supplements. BMI 14.3.  Likely IDA: will check iron panel. No need for a transfusion currently    DVT prophylaxis: lovenox Code Status: full  Family Communication: Disposition Plan: likely d/c back home   Level of care: Med-Surg   Status is: Inpatient  Remains inpatient appropriate because:Ongoing diagnostic testing needed not appropriate for outpatient work up, IV treatments appropriate due to intensity of illness or inability to take PO and Inpatient level of care appropriate due to severity of illness   Dispo: The patient is from: Home              Anticipated d/c is to: Home              Patient currently is not medically stable to d/c.   Difficult to place patient Yes   Consultants:   General surg  Urology   Procedures:    Antimicrobials: rocephin    Subjective: Pt c/o decreased appetite   Objective: Vitals:    08/29/20 0800 08/29/20 1559 08/29/20 2033 08/30/20 0055  BP: 110/73 98/66 108/68 (!) 106/54  Pulse: 73 79 73 66  Resp:  16 18 18   Temp: 98 F (36.7 C) 98.2 F (36.8 C) 98.2 F (36.8 C) 98.6 F (37 C)  TempSrc: Oral     SpO2: 100% 97% 99% 98%  Weight:      Height:        Intake/Output Summary (Last 24 hours) at 08/30/2020 0757 Last data filed at 08/30/2020 0100 Gross per 24 hour  Intake 0 ml  Output 580 ml  Net -580 ml   Filed Weights   08/28/20 1958 08/29/20 0456  Weight: 45.4 kg 45.4 kg    Examination:  General exam: Appears calm and comfortable. Cachetic  Respiratory system: Clear to auscultation. Respiratory effort normal. Cardiovascular system: S1 & S2 +. No rubs, gallops or clicks. . Gastrointestinal system: Abdomen is nondistended, soft and nontender. Normal bowel sounds heard. Central nervous system: Alert and oriented.  Psychiatry: Judgement and insight appear normal. Flat mood and affect     Data Reviewed: I have personally reviewed following labs and imaging studies  CBC: Recent Labs  Lab 08/28/20 2111 08/29/20 0533 08/30/20 0523  WBC 13.0* 9.2 7.6  NEUTROABS 10.7*  --   --   HGB 11.9* 9.5* 9.1*  HCT 39.1 31.3* 30.9*  MCV 71.6* 72.0* 73.2*  PLT 514* 362 295   Basic Metabolic Panel: Recent Labs  Lab 08/28/20 2111 08/29/20 0533 08/30/20 0523  NA  137 138 136  K 3.2* 4.0 4.3  CL 97* 103 107  CO2 26 27 23   GLUCOSE 115* 112* 85  BUN 13 9 <5*  CREATININE 0.54* 0.47* 0.39*  CALCIUM 8.9 8.2* 8.4*  MG 2.0  --   --    GFR: Estimated Creatinine Clearance: 74.9 mL/min (A) (by C-G formula based on SCr of 0.39 mg/dL (L)). Liver Function Tests: Recent Labs  Lab 08/28/20 2111 08/30/20 0523  AST 15 8*  ALT 11 8  ALKPHOS 70 56  BILITOT 0.9 0.5  PROT 7.7 6.0*  ALBUMIN 3.6 2.8*   Recent Labs  Lab 08/29/20 0533  LIPASE 30   No results for input(s): AMMONIA in the last 168 hours. Coagulation Profile: No results for input(s): INR, PROTIME in  the last 168 hours. Cardiac Enzymes: No results for input(s): CKTOTAL, CKMB, CKMBINDEX, TROPONINI in the last 168 hours. BNP (last 3 results) No results for input(s): PROBNP in the last 8760 hours. HbA1C: No results for input(s): HGBA1C in the last 72 hours. CBG: No results for input(s): GLUCAP in the last 168 hours. Lipid Profile: No results for input(s): CHOL, HDL, LDLCALC, TRIG, CHOLHDL, LDLDIRECT in the last 72 hours. Thyroid Function Tests: No results for input(s): TSH, T4TOTAL, FREET4, T3FREE, THYROIDAB in the last 72 hours. Anemia Panel: No results for input(s): VITAMINB12, FOLATE, FERRITIN, TIBC, IRON, RETICCTPCT in the last 72 hours. Sepsis Labs: Recent Labs  Lab 08/28/20 2231  LATICACIDVEN 1.6    Recent Results (from the past 240 hour(s))  Resp Panel by RT-PCR (Flu A&B, Covid) Nasopharyngeal Swab     Status: None   Collection Time: 08/28/20 10:31 PM   Specimen: Nasopharyngeal Swab; Nasopharyngeal(NP) swabs in vial transport medium  Result Value Ref Range Status   SARS Coronavirus 2 by RT PCR NEGATIVE NEGATIVE Final    Comment: (NOTE) SARS-CoV-2 target nucleic acids are NOT DETECTED.  The SARS-CoV-2 RNA is generally detectable in upper respiratory specimens during the acute phase of infection. The lowest concentration of SARS-CoV-2 viral copies this assay can detect is 138 copies/mL. A negative result does not preclude SARS-Cov-2 infection and should not be used as the sole basis for treatment or other patient management decisions. A negative result may occur with  improper specimen collection/handling, submission of specimen other than nasopharyngeal swab, presence of viral mutation(s) within the areas targeted by this assay, and inadequate number of viral copies(<138 copies/mL). A negative result must be combined with clinical observations, patient history, and epidemiological information. The expected result is Negative.  Fact Sheet for Patients:   BloggerCourse.comhttps://www.fda.gov/media/152166/download  Fact Sheet for Healthcare Providers:  SeriousBroker.ithttps://www.fda.gov/media/152162/download  This test is no t yet approved or cleared by the [place] FDA and  has been authorized for detection and/or diagnosis of SARS-CoV-2 by FDA under an Emergency Use Authorization (EUA). This EUA will remain  in effect (meaning this test can be used) for the duration of the COVID-19 declaration under Section 564(b)(1) of the Act, 21 U.S.C.section 360bbb-3(b)(1), unless the authorization is terminated  or revoked sooner.       Influenza A by PCR NEGATIVE NEGATIVE Final   Influenza B by PCR NEGATIVE NEGATIVE Final    Comment: (NOTE) The Xpert Xpress SARS-CoV-2/FLU/RSV plus assay is intended as an aid in the diagnosis of influenza from Nasopharyngeal swab specimens and should not be used as a sole basis for treatment. Nasal washings and aspirates are unacceptable for Xpert Xpress SARS-CoV-2/FLU/RSV testing.  Fact Sheet for Patients: BloggerCourse.comhttps://www.fda.gov/media/152166/download  Fact Sheet for  Healthcare Providers: SeriousBroker.it  This test is not yet approved or cleared by the [place] and has been authorized for detection and/or diagnosis of SARS-CoV-2 by FDA under an Emergency Use Authorization (EUA). This EUA will remain in effect (meaning this test can be used) for the duration of the COVID-19 declaration under Section 564(b)(1) of the Act, 21 U.S.C. section 360bbb-3(b)(1), unless the authorization is terminated or revoked.  Performed at [facility], [address] [place], [place] [address]   Blood culture (routine single)     Status: None (Preliminary result)   Collection Time: 08/28/20 10:31 PM   Specimen: BLOOD  Result Value Ref Range Status   Specimen Description BLOOD RIGHT ANTECUBITAL  Final   Special Requests   Final    BOTTLES DRAWN AEROBIC AND ANAEROBIC Blood Culture adequate volume    Culture   Final    NO GROWTH 2 DAYS Performed at [facility], [address], [place], [place] [address]    Report Status PENDING  Incomplete  Urine culture     Status: Abnormal   Collection Time: 08/28/20 10:31 PM   Specimen: In/Out Cath Urine  Result Value Ref Range Status   Specimen Description   Final    IN/OUT CATH URINE Performed at [facility], [address] [place], [place] [address]    Special Requests   Final    NONE Performed at [facility], [address], [place], [place] [address]    Culture MULTIPLE SPECIES PRESENT, SUGGEST RECOLLECTION (A)  Final   Report Status 08/30/2020 FINAL  Final         Radiology Studies: CT ABDOMEN PELVIS W CONTRAST  Result Date: 08/29/2020 CLINICAL DATA:  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 Para paresis of both lower extremities. Neurogenic bladder. Prior osteomyelitis a 5 [place] shin at EXAM: CT ABDOMEN AND PELVIS WITH CONTRAST TECHNIQUE: Multidetector CT imaging of the abdomen and pelvis was performed using the standard protocol following bolus administration of intravenous contrast. CONTRAST:  OMNIPAQUE IOHEXOL 300 MG/ML  SOLN COMPARISON:  CT 06/26/2020 FINDINGS: Lower chest: Lung bases are clear. Normal heart size. No pericardial effusion. Pectus deformity of the chest. Hepatobiliary: Small subcentimeter hypoattenuating foci in the right lobe liver, too small to fully characterize on CT imaging but statistically likely benign. No concerning focal liver lesion. Smooth liver surface contour. Normal gallbladder and biliary tree. Pancreas: No pancreatic ductal dilatation or surrounding inflammatory changes. Spleen: Normal in size. No concerning splenic lesions. Adrenals/Urinary Tract: Normal adrenal glands. Multiple fluid attenuation cysts present in both kidneys with few additional smaller subcentimeter hypoattenuating foci too small to characterize though statistically  likely benign. No concerning focal renal lesion. Kidneys enhance and excrete symmetrically. No urolithiasis or hydronephrosis. Urinary bladder is circumferentially thickened with intraluminal gas. Inflated Foley catheter is in place. Redemonstration of direct cutaneous fistulization from decubitus ulceration to portion of the urethra (2/81). Stomach/Bowel: Distal esophagus, stomach and duodenum are unremarkable. No large or small bowel thickening or dilatation. No evidence of obstruction. Normal appendix. There is significant rectal wall thickening contiguous with some ulceration and skin thickening around the anal verge Vascular/Lymphatic: Atherosclerotic calcifications within the abdominal aorta and branch vessels. No aneurysm or ectasia. No pathologically enlarged abdominopelvic lymph nodes. Reactive appearing adenopathy noted in the bilateral inguinal chains and along the pelvic sidewalls and mesorectal fat. Reproductive: Enlarged prostate with heterogeneous enhancement. Seminal vesicles are unremarkable. Extensive ulceration of the pudendal tissues with directs cutaneous fistulization to  the base of the scrotum and penile corpora (2/81). Few foci of gas noted along the urethra as well. Other: Extensive soft tissue ulceration superficial to the sacrum and along the ischial tuberosities bilateral including more focal thickening and deep ulceration which extends into the pudendal soft tissues, to the base of the penis and scrotum with direct extension along the urethra. Musculoskeletal: Extensive sclerotic and destructive changes involving the bilateral is CT, right greater than left. Bilateral hip effusions are noted with synovial thickening and surrounding inflammatory change. Septic arthritis not fully excluded. Additional sclerotic changes and bony remodeling along the sacrum, posterior left ilium as well with overlying cutaneous thickening and decubitus ulceration. Thoracolumbar fusion is again noted with  hardware spanning a T11 vertebral body fracture with cord compression, overall unchanged in configuration from comparison exam. No acute complication is seen. Long segment bony fusion of the posterior elements. IMPRESSION: Extensive decubitus ulceration across the sacrum as well as the bilateral ischial tuberosities with soft tissue thickening and subjacent regions of sclerotic and destructive osseous changes suggesting sequela of chronic and or remote osteomyelitis though some additional acute bony infection may be present. Bilateral hip effusions with extensive synovial thickening are present as well, sterility of these hip collections is not ascertained on imaging alone. Could consider sampling. More focal region of direct cutaneous ulceration and soft tissue defect is seen extending from the right ischial ulceration into the pudendal tissues towards the base of the penis and scrotum with direct extension and involvement of what is likely the membranous portion of the urethra. Associated soft tissue gas is noted along the read through itself with intraluminal gas in the urinary bladder as well as significant circumferential bladder wall thickening likely reflecting a superimposed cystitis. Prostatomegaly with heterogeneous enhancement, possibly related to contrast timing though a superimposed prostatitis is certainly likely given additional findings above. Electronically Signed   By: Kreg Shropshire M.D.   On: 08/29/2020 00:03   DG Chest Port 1 View  Result Date: 08/28/2020 CLINICAL DATA:  Nausea, vomiting, paraplegia, concern for sepsis EXAM: PORTABLE CHEST 1 VIEW COMPARISON:  03/18/2018 chest radiograph. FINDINGS: Partially visualized bilateral posterior spinal fusion hardware extending inferiorly from the midthoracic spine. Stable cardiomediastinal silhouette with normal heart size. No pneumothorax. No pleural effusion. No pulmonary edema. New tiny nodular 4 mm opacity in the peripheral lower right lung.  Otherwise clear lungs. IMPRESSION: New tiny nodular 4 mm opacity in the peripheral lower right lung, cannot exclude a pulmonary nodule. Otherwise no active disease in the chest. Suggest short-term outpatient follow-up chest CT. Electronically Signed   By: Delbert Phenix M.D.   On: 08/28/2020 22:03        Scheduled Meds: . vitamin C  250 mg Oral BID  . Chlorhexidine Gluconate Cloth  6 each Topical Daily  . enoxaparin (LOVENOX) injection  40 mg Subcutaneous Q24H  . feeding supplement  1 Container Oral TID BM  . multivitamin-lutein  1 capsule Oral Daily   Continuous Infusions: . 0.9 % NaCl with KCl 40 mEq / L 100 mL/hr at 08/29/20 1323  . cefTRIAXone (ROCEPHIN)  IV 1 g (08/29/20 2351)     LOS: 1 day    Time spent: 33 mins     Charise Killian, MD [facility] Pager [phone]  If 7PM-7AM, please contact night-coverage 08/30/2020, 7:57 AM

## 2020-08-31 ENCOUNTER — Encounter: Payer: Self-pay | Admitting: Internal Medicine

## 2020-08-31 ENCOUNTER — Inpatient Hospital Stay: Payer: Medicare Other

## 2020-08-31 ENCOUNTER — Other Ambulatory Visit: Payer: Self-pay

## 2020-08-31 DIAGNOSIS — L988 Other specified disorders of the skin and subcutaneous tissue: Secondary | ICD-10-CM | POA: Diagnosis not present

## 2020-08-31 DIAGNOSIS — G8220 Paraplegia, unspecified: Secondary | ICD-10-CM | POA: Diagnosis not present

## 2020-08-31 DIAGNOSIS — N390 Urinary tract infection, site not specified: Secondary | ICD-10-CM | POA: Diagnosis not present

## 2020-08-31 LAB — BASIC METABOLIC PANEL
Anion gap: 8 (ref 5–15)
BUN: 6 mg/dL (ref 6–20)
CO2: 26 mmol/L (ref 22–32)
Calcium: 8.6 mg/dL — ABNORMAL LOW (ref 8.9–10.3)
Chloride: 106 mmol/L (ref 98–111)
Creatinine, Ser: 0.36 mg/dL — ABNORMAL LOW (ref 0.61–1.24)
GFR, Estimated: >60 mL/min (ref >60)
Glucose, Bld: 93 mg/dL (ref 70–99)
Potassium: 3.7 mmol/L (ref 3.5–5.1)
Sodium: 140 mmol/L (ref 135–145)

## 2020-08-31 LAB — CBC
HCT: 32.7 % — ABNORMAL LOW (ref 39.0–52.0)
Hemoglobin: 10 g/dL — ABNORMAL LOW (ref 13.0–17.0)
MCH: 22.2 pg — ABNORMAL LOW (ref 26.0–34.0)
MCHC: 30.6 g/dL (ref 30.0–36.0)
MCV: 72.5 fL — ABNORMAL LOW (ref 80.0–100.0)
Platelets: 330 10*3/uL (ref 150–400)
RBC: 4.51 MIL/uL (ref 4.22–5.81)
RDW: 18.9 % — ABNORMAL HIGH (ref 11.5–15.5)
WBC: 5.5 10*3/uL (ref 4.0–10.5)
nRBC: 0 % (ref 0.0–0.2)

## 2020-08-31 LAB — IRON AND TIBC
Iron: 21 ug/dL — ABNORMAL LOW (ref 45–182)
Saturation Ratios: 10 % — ABNORMAL LOW (ref 17.9–39.5)
TIBC: 211 ug/dL — ABNORMAL LOW (ref 250–450)
UIBC: 190 ug/dL

## 2020-08-31 LAB — MAGNESIUM: Magnesium: 2.1 mg/dL (ref 1.7–2.4)

## 2020-08-31 LAB — FERRITIN: Ferritin: 39 ng/mL (ref 24–336)

## 2020-08-31 MED ORDER — FERROUS GLUCONATE 324 (38 FE) MG PO TABS
324.0000 mg | ORAL_TABLET | Freq: Two times a day (BID) | ORAL | Status: DC
  Administered 2020-08-31 – 2020-09-01 (×3): 324 mg via ORAL
  Filled 2020-08-31 (×6): qty 1

## 2020-08-31 MED ORDER — SODIUM CHLORIDE 0.9 % IV SOLN
INTRAVENOUS | Status: AC | PRN
  Administered 2020-08-31: 2 g via INTRAVENOUS

## 2020-08-31 MED ORDER — FENTANYL CITRATE (PF) 100 MCG/2ML IJ SOLN
INTRAMUSCULAR | Status: AC
  Filled 2020-08-31: qty 2

## 2020-08-31 MED ORDER — MIDAZOLAM HCL 2 MG/2ML IJ SOLN
INTRAMUSCULAR | Status: AC | PRN
  Administered 2020-08-31: 1 mg via INTRAVENOUS

## 2020-08-31 MED ORDER — MIDAZOLAM HCL 2 MG/2ML IJ SOLN
INTRAMUSCULAR | Status: AC
  Filled 2020-08-31: qty 2

## 2020-08-31 MED ORDER — FENTANYL CITRATE (PF) 100 MCG/2ML IJ SOLN
INTRAMUSCULAR | Status: AC | PRN
  Administered 2020-08-31 (×2): 50 ug via INTRAVENOUS

## 2020-08-31 NOTE — Procedures (Signed)
Interventional Radiology Procedure Note  Procedure: CT guided suprapubic bladder drainage catheter placement  Complications: None  Estimated Blood Loss: < 10 mL  Findings: 12 Fr pigtail drainage catheter placed via low midline SP approach into bladder. Urine return slightly blood-tinged after placement. Connected to gravity drainage bag.  Jodi Marble. Fredia Sorrow, M.D Pager:  [phone]

## 2020-08-31 NOTE — Progress Notes (Signed)
Chief Complaint: Patient was seen in consultation today for suprapubic catheter placement  Referring Physician(s): Carman Ching PA-C  Supervising Physician: Irish Lack  Patient Status: [facility] - In-pt  History of Present Illness: Jonathon York is a 45 y.o. 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 Urology was consulted and recommends suprapubic tube placement for urinary diversion. PMHx, meds, labs, imaging, allergies reviewed. Pt is paraplegic but retains some sensation Feels well, no recent fevers, chills, illness. Has been NPO today as directed.    Past Medical History:  Diagnosis Date  . Anxiety   . Decubitus ulcer   . Depression   . Neurogenic bladder   . Osteomyelitis (HCC)   . 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 AMPUTATION Right 03/19/2018   Procedure: 5th Metatarsal Resection;  Surgeon: Recardo Evangelist, DPM;  Location: [facility] ORS;  Service: Podiatry;  Laterality: Right;  . 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 IRRIGATION AND DEBRIDEMENT BUTTOCKS    . TOE AMPUTATION Right     Allergies: Orange fruit [citrus] and Sulfa antibiotics  Medications:  Current Facility-Administered Medications:  .  acetaminophen (TYLENOL) tablet 650 mg, 650 mg, Oral, Q6H PRN **OR** acetaminophen (TYLENOL) suppository 650 mg, 650 mg, Rectal, Q6H PRN, Andris Baumann, MD .  ascorbic acid (VITAMIN C) tablet 250 mg, 250 mg, Oral, BID, [place], Alvira Philips, DO, 250 mg at 08/30/20 2118 .  cefTRIAXone (ROCEPHIN) 1 g in sodium chloride 0.9 % 100 mL IVPB, 1 g, Intravenous, Q24H, [place], Alvira Philips, DO, Last Rate: 200 mL/hr at 08/30/20 2126, 1 g at 08/30/20 2126 .  Chlorhexidine Gluconate Cloth 2 % PADS 6 each, 6 each, Topical, Daily,  [place], Eric J, DO, 6 each at 08/30/20 1000 .  feeding supplement (BOOST / RESOURCE BREEZE) liquid 1 Container, 1 Container, Oral, TID BM, [place], Alvira Philips, DO, 1 Container at 08/30/20 1950 .  fentaNYL (SUBLIMAZE) 100 MCG/2ML injection, , , ,  .  HYDROcodone-acetaminophen (NORCO/VICODIN) 5-325 MG per tablet 1-2 tablet, 1-2 tablet, Oral, Q4H PRN, Andris Baumann, MD, 2 tablet at 08/30/20 1948 .  midazolam (VERSED) 2 MG/2ML injection, , , ,  .  morphine 2 MG/ML injection 2 mg, 2 mg, Intravenous, Q2H PRN, Andris Baumann, MD .  multivitamin-lutein (OCUVITE-LUTEIN) capsule 1 capsule, 1 capsule, Oral, Daily, [place], Alvira Philips, DO, 1 capsule at 08/30/20 1006 .  ondansetron (ZOFRAN) tablet 4 mg, 4 mg, Oral, Q6H PRN, 4 mg at 08/30/20 1948 **OR** ondansetron (ZOFRAN) injection 4 mg, 4 mg, Intravenous, Q6H PRN, Andris Baumann, MD    Family History  Problem Relation Age of Onset  . Lymphoma Sister     Social History   Socioeconomic History  . Marital status: Single    Spouse name: Not on file  . Number of children: Not on file  . Years of education: Not on file  . Highest education level: Not on file  Occupational History  . Not on file  Tobacco Use  . Smoking status: Former Smoker    Packs/day: 0.50    Types: Cigarettes  . Smokeless tobacco: Never Used  Substance and Sexual Activity  . Alcohol use: No  . Drug use: Yes    Types: Marijuana    Comment: 2 days ago  .  Sexual activity: Never  Other Topics Concern  . Not on file  Social History Narrative  . Not on file   Social Determinants of Health   Financial Resource Strain: Not on file  Food Insecurity: Not on file  Transportation Needs: Not on file  Physical Activity: Not on file  Stress: Not on file  Social Connections: Not on file    Review of Systems: A 12 point ROS discussed and pertinent positives are indicated in the HPI above.  All other systems are negative.  Review of Systems  Vital Signs: BP 98/60 (BP Location:  Right Arm)   Pulse 67   Temp 98.3 F (36.8 C)   Resp 16   Ht 5\' 10"  (1.778 m)   Wt 45.4 kg   SpO2 98%   BMI 14.36 kg/m   Physical Exam Constitutional:      Appearance: Normal appearance. He is not ill-appearing.  Cardiovascular:     Rate and Rhythm: Normal rate and regular rhythm.     Heart sounds: Normal heart sounds.  Pulmonary:     Effort: Pulmonary effort is normal. No respiratory distress.     Breath sounds: Normal breath sounds.  Skin:    General: Skin is warm and dry.  Neurological:     Mental Status: He is alert and oriented to person, place, and time.  Psychiatric:        Mood and Affect: Mood normal.        Thought Content: Thought content normal.        Judgment: Judgment normal.       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 Para paresis of both lower extremities. Neurogenic bladder. Prior osteomyelitis a 5 King shin at EXAM: CT ABDOMEN AND PELVIS WITH CONTRAST TECHNIQUE: Multidetector CT imaging of the abdomen and pelvis was performed using the standard protocol following bolus administration of intravenous contrast. CONTRAST:  100mL OMNIPAQUE IOHEXOL 300 MG/ML  SOLN COMPARISON:  CT 06/26/2020 FINDINGS: Lower chest: Lung bases are clear. Normal heart size. No pericardial effusion. Pectus deformity of the chest. Hepatobiliary: Small subcentimeter hypoattenuating foci in the right lobe liver, too small to fully characterize on CT imaging but statistically likely benign. No concerning focal liver lesion. Smooth liver surface contour. Normal gallbladder and biliary tree. Pancreas: No pancreatic ductal dilatation or surrounding inflammatory changes. Spleen: Normal in size. No concerning splenic lesions. Adrenals/Urinary Tract: Normal adrenal glands. Multiple fluid attenuation cysts present in both kidneys with few additional smaller subcentimeter hypoattenuating foci  too small to characterize though statistically likely benign. No concerning focal renal lesion. Kidneys enhance and excrete symmetrically. No urolithiasis or hydronephrosis. Urinary bladder is circumferentially thickened with intraluminal gas. Inflated Foley catheter is in place. Redemonstration of direct cutaneous fistulization from decubitus ulceration to portion of the urethra (2/81). Stomach/Bowel: Distal esophagus, stomach and duodenum are unremarkable. No large or small bowel thickening or dilatation. No evidence of obstruction. Normal appendix. There is significant rectal wall thickening contiguous with some ulceration and skin thickening around the anal verge Vascular/Lymphatic: Atherosclerotic calcifications within the abdominal aorta and branch vessels. No aneurysm or ectasia. No pathologically enlarged abdominopelvic lymph nodes. Reactive appearing adenopathy noted in the bilateral inguinal chains and along the pelvic sidewalls and mesorectal fat. Reproductive: Enlarged prostate with heterogeneous enhancement. Seminal vesicles are unremarkable. Extensive ulceration of the pudendal tissues with directs cutaneous fistulization to the base of the scrotum and penile corpora (2/81).  Few foci of gas noted along the urethra as well. Other: Extensive soft tissue ulceration superficial to the sacrum and along the ischial tuberosities bilateral including more focal thickening and deep ulceration which extends into the pudendal soft tissues, to the base of the penis and scrotum with direct extension along the urethra. Musculoskeletal: Extensive sclerotic and destructive changes involving the bilateral is CT, right greater than left. Bilateral hip effusions are noted with synovial thickening and surrounding inflammatory change. Septic arthritis not fully excluded. Additional sclerotic changes and bony remodeling along the sacrum, posterior left ilium as well with overlying cutaneous thickening and decubitus  ulceration. Thoracolumbar fusion is again noted with hardware spanning a T11 vertebral body fracture with cord compression, overall unchanged in configuration from comparison exam. No acute complication is seen. Long segment bony fusion of the posterior elements. IMPRESSION: Extensive decubitus ulceration across the sacrum as well as the bilateral ischial tuberosities with soft tissue thickening and subjacent regions of sclerotic and destructive osseous changes suggesting sequela of chronic and or remote osteomyelitis though some additional acute bony infection may be present. Bilateral hip effusions with extensive synovial thickening are present as well, sterility of these hip collections is not ascertained on imaging alone. Could consider sampling. More focal region of direct cutaneous ulceration and soft tissue defect is seen extending from the right ischial ulceration into the pudendal tissues towards the base of the penis and scrotum with direct extension and involvement of what is likely the membranous portion of the urethra. Associated soft tissue gas is noted along the read through itself with intraluminal gas in the urinary bladder as well as significant circumferential bladder wall thickening likely reflecting a superimposed cystitis. Prostatomegaly with heterogeneous enhancement, possibly related to contrast timing though a superimposed prostatitis is certainly likely given additional findings above. Electronically Signed   By: Kreg Shropshire M.D.   On: 08/29/2020 00:03   DG Chest Port 1 View  Result Date: 08/28/2020 CLINICAL DATA:  Nausea, vomiting, paraplegia, concern for sepsis EXAM: PORTABLE CHEST 1 VIEW COMPARISON:  03/18/2018 chest radiograph. FINDINGS: Partially visualized bilateral posterior spinal fusion hardware extending inferiorly from the midthoracic spine. Stable cardiomediastinal silhouette with normal heart size. No pneumothorax. No pleural effusion. No pulmonary edema. New tiny nodular 4  mm opacity in the peripheral lower right lung. Otherwise clear lungs. IMPRESSION: New tiny nodular 4 mm opacity in the peripheral lower right lung, cannot exclude a pulmonary nodule. Otherwise no active disease in the chest. Suggest short-term outpatient follow-up chest CT. Electronically Signed   By: Delbert Phenix M.D.   On: 08/28/2020 22:03    Labs:  CBC: Recent Labs    08/28/20 2111 08/29/20 0533 08/30/20 0523 08/31/20 0602  WBC 13.0* 9.2 7.6 5.5  HGB 11.9* 9.5* 9.1* 10.0*  HCT 39.1 31.3* 30.9* 32.7*  PLT 514* 362 295 330    COAGS: Recent Labs    10/27/19 0838  INR 1.1    BMP: Recent Labs    10/28/19 0404 12/01/19 0132 12/02/19 0530 12/03/19 0448 06/26/20 1823 08/28/20 2111 08/29/20 0533 08/30/20 0523 08/31/20 0602  NA 138 137 139 142   < > 137 138 136 140  K 3.8 3.6 3.2* 3.5   < > 3.2* 4.0 4.3 3.7  CL 107 100 108 110   < > 97* 103 107 106  CO2 23 15* 24 26   < > 26 27 23 26   GLUCOSE 87 113* 111* 99   < > 115* 112* 85 93  BUN 9  16 7 5*   < > 13 9 <5* 6  CALCIUM 8.2* 9.0 8.6* 8.2*   < > 8.9 8.2* 8.4* 8.6*  CREATININE 0.40* 0.61 0.38* 0.36*   < > 0.54* 0.47* 0.39* 0.36*  GFRNONAA >60 >60 >60 >60   < > >60 >60 >60 >60  GFRAA >60 >60 >60 >60  --   --   --   --   --    < > = values in this interval not displayed.    LIVER FUNCTION TESTS: Recent Labs    12/01/19 0132 06/26/20 1823 08/28/20 2111 08/30/20 0523  BILITOT 1.5* 0.8 0.9 0.5  AST 13* 15 15 8*  ALT 11 11 11 8   ALKPHOS 84 70 70 56  PROT 8.6* 8.2* 7.7 6.0*  ALBUMIN 4.1 3.8 3.6 2.8*    TUMOR MARKERS: No results for input(s): AFPTM, CEA, CA199, CHROMGRNA in the last 8760 hours.  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 Needs urinary diversion. Plan for image guided suprapubic catheter placement. Labs reviewed. Risks and benefits discussed with the patient including bleeding, infection, damage to adjacent structures, bowel perforation/fistula connection, and  sepsis.  All of the patient's questions were answered, patient is agreeable to proceed. Consent signed and in chart.     Thank you for this interesting consult.  I greatly enjoyed meeting ADIT RIDDLES and look forward to participating in their care.  A copy of this report was sent to the requesting provider on this date.  Electronically Signed: [name], PA-C 08/31/2020, 9:57 AM   I spent a total of 20 minutes in face to face in clinical consultation, greater than 50% of which was counseling/coordinating care for SP tube

## 2020-08-31 NOTE — Progress Notes (Signed)
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  Continue on IV ceftriaxone.   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 Foley exchange attempted 08/30/20 but foley end came out of sacral wound which was removed. Will go for suprapubic cath today. No general surg needs currently as per general surg, Continue w/ wound care. Upcoming discussion for potential surgery planned at [facility] for cutaneous fistula followed by plastic surg for flap closure regarding sacral/ischial decubitus ulcers as per previous hospitalist  Paraplegia: continue w/ supportive care   Severe protein calorie malnutrition: continue w/ nutritional supplements. BMI 14.3.  Likely IDA: will start iron supplements. H&H are trending up today    DVT prophylaxis: lovenox Code Status: full  Family Communication: Disposition Plan: likely d/c back home   Level of care: Med-Surg   Status is: Inpatient  Remains inpatient appropriate because:Ongoing diagnostic testing needed not appropriate for outpatient work up, IV treatments appropriate due to intensity of illness or inability to take PO and Inpatient level of care appropriate due to severity of illness   Dispo: The patient is from: Home              Anticipated d/c is to: Home              Patient currently is not medically stable to d/c.   Difficult to place patient Yes   Consultants:   General surg  Urology   Procedures:    Antimicrobials: rocephin    Subjective: Pt c/o  fatigue   Objective: Vitals:   08/30/20 1226 08/30/20 1500 08/30/20 2028 08/31/20 0352  BP: (!) 100/56 118/62 103/64 90/60  Pulse: 79 68 73 64  Resp: 17 16 18 18   Temp: 98.7 F (37.1 C) 98.6 F (37 C) 98.9 F (37.2 C) 98.2 F (36.8 C)  TempSrc: Oral Oral    SpO2: 99% 98% 98% 97%  Weight:      Height:        Intake/Output Summary (Last 24 hours) at 08/31/2020 0742 Last data filed at 08/31/2020 0615 Gross per 24 hour  Intake 440 ml  Output 250 ml  Net 190 ml   Filed Weights   08/28/20 1958 08/29/20 0456  Weight: 45.4 kg 45.4 kg    Examination:  General exam: Appears comfortable. Cachetic   Respiratory system: clear breath sounds b/l  Cardiovascular system: S1/S2+. No gallops or clicks  Gastrointestinal system: Abd is soft, NT, ND & hypoactive bowel sounds  Central nervous system: Alert and oriented. Moves b/l UE  Psychiatry: judgement and insight appear normal. Flat mood and affect    Data Reviewed: I have personally reviewed following labs and imaging studies  CBC: Recent Labs  Lab 08/28/20 2111 08/29/20 0533 08/30/20 0523 08/31/20 0602  WBC 13.0* 9.2 7.6 5.5  NEUTROABS 10.7*  --   --   --   HGB 11.9* 9.5* 9.1* 10.0*  HCT 39.1 31.3* 30.9* 32.7*  MCV 71.6* 72.0* 73.2* 72.5*  PLT 514* 362 295 330   Basic Metabolic Panel: Recent  Labs  Lab 08/28/20 2111 08/29/20 0533 08/30/20 0523 08/31/20 0602  NA 137 138 136 140  K 3.2* 4.0 4.3 3.7  CL 97* 103 107 106  CO2 26 27 23 26   GLUCOSE 115* 112* 85 93  BUN 13 9 <5* 6  CREATININE 0.54* 0.47* 0.39* 0.36*  CALCIUM 8.9 8.2* 8.4* 8.6*  MG 2.0  --   --  2.1   GFR: Estimated Creatinine Clearance: 74.9 mL/min (A) (by C-G formula based on SCr of 0.36 mg/dL (L)). Liver Function Tests: Recent Labs  Lab 08/28/20 2111 08/30/20 0523  AST 15 8*  ALT 11 8  ALKPHOS 70 56  BILITOT 0.9 0.5  PROT 7.7 6.0*  ALBUMIN 3.6 2.8*   Recent Labs  Lab 08/29/20 0533  LIPASE 30   No results for input(s): AMMONIA in the  last 168 hours. Coagulation Profile: No results for input(s): INR, PROTIME in the last 168 hours. Cardiac Enzymes: No results for input(s): CKTOTAL, CKMB, CKMBINDEX, TROPONINI in the last 168 hours. BNP (last 3 results) No results for input(s): PROBNP in the last 8760 hours. HbA1C: No results for input(s): HGBA1C in the last 72 hours. CBG: No results for input(s): GLUCAP in the last 168 hours. Lipid Profile: No results for input(s): CHOL, HDL, LDLCALC, TRIG, CHOLHDL, LDLDIRECT in the last 72 hours. Thyroid Function Tests: No results for input(s): TSH, T4TOTAL, FREET4, T3FREE, THYROIDAB in the last 72 hours. Anemia Panel: Recent Labs    08/31/20 0602  FERRITIN 39  TIBC 211*  IRON 21*   Sepsis Labs: Recent Labs  Lab 08/28/20 2231  LATICACIDVEN 1.6    Recent Results (from the past 240 hour(s))  Resp Panel by RT-PCR (Flu A&B, Covid) Nasopharyngeal Swab     Status: None   Collection Time: 08/28/20 10:31 PM   Specimen: Nasopharyngeal Swab; Nasopharyngeal(NP) swabs in vial transport medium  Result Value Ref Range Status   SARS Coronavirus 2 by RT PCR NEGATIVE NEGATIVE Final    Comment: (NOTE) SARS-CoV-2 target nucleic acids are NOT DETECTED.  The SARS-CoV-2 RNA is generally detectable in upper respiratory specimens during the acute phase of infection. The lowest concentration of SARS-CoV-2 viral copies this assay can detect is 138 copies/mL. A negative result does not preclude SARS-Cov-2 infection and should not be used as the sole basis for treatment or other patient management decisions. A negative result may occur with  improper specimen collection/handling, submission of specimen other than nasopharyngeal swab, presence of viral mutation(s) within the areas targeted by this assay, and inadequate number of viral copies(<138 copies/mL). A negative result must be combined with clinical observations, patient history, and epidemiological information. The expected result is  Negative.  Fact Sheet for Patients:  10/28/20  Fact Sheet for Healthcare Providers:  BloggerCourse.com  This test is no t yet approved or cleared by the [place] FDA and  has been authorized for detection and/or diagnosis of SARS-CoV-2 by FDA under an Emergency Use Authorization (EUA). This EUA will remain  in effect (meaning this test can be used) for the duration of the COVID-19 declaration under Section 564(b)(1) of the Act, 21 U.S.C.section 360bbb-3(b)(1), unless the authorization is terminated  or revoked sooner.       Influenza A by PCR NEGATIVE NEGATIVE Final   Influenza B by PCR NEGATIVE NEGATIVE Final    Comment: (NOTE) The Xpert Xpress SARS-CoV-2/FLU/RSV plus assay is intended as an aid in the diagnosis of influenza from Nasopharyngeal swab specimens and should not be used as a  sole basis for treatment. Nasal washings and aspirates are unacceptable for Xpert Xpress SARS-CoV-2/FLU/RSV testing.  Fact Sheet for Patients: BloggerCourse.com  Fact Sheet for Healthcare Providers: SeriousBroker.it  This test is not yet approved or cleared by the [place] FDA and has been authorized for detection and/or diagnosis of SARS-CoV-2 by FDA under an Emergency Use Authorization (EUA). This EUA will remain in effect (meaning this test can be used) for the duration of the COVID-19 declaration under Section 564(b)(1) of the Act, 21 U.S.C. section 360bbb-3(b)(1), unless the authorization is terminated or revoked.  Performed at [facility], [address] [place], [place] [address]   Blood culture (routine single)     Status: None (Preliminary result)   Collection Time: 08/28/20 10:31 PM   Specimen: BLOOD  Result Value Ref Range Status   Specimen Description BLOOD RIGHT ANTECUBITAL  Final   Special Requests   Final    BOTTLES DRAWN AEROBIC AND ANAEROBIC  Blood Culture adequate volume   Culture   Final    NO GROWTH 3 DAYS Performed at [facility], [address], [place], [place] [address]    Report Status PENDING  Incomplete  Urine culture     Status: Abnormal   Collection Time: 08/28/20 10:31 PM   Specimen: In/Out Cath Urine  Result Value Ref Range Status   Specimen Description   Final    IN/OUT CATH URINE Performed at [facility], [address] [place], [place] [address]    Special Requests   Final    NONE Performed at [facility], [address], [place], [place] [address]    Culture MULTIPLE SPECIES PRESENT, SUGGEST RECOLLECTION (A)  Final   Report Status 08/30/2020 FINAL  Final         Radiology Studies: No results found.      Scheduled Meds: . vitamin C  250 mg Oral BID  . Chlorhexidine Gluconate Cloth  6 each Topical Daily  . feeding supplement  1 Container Oral TID BM  . multivitamin-lutein  1 capsule Oral Daily   Continuous Infusions: . cefTRIAXone (ROCEPHIN)  IV 1 g (08/30/20 2126)     LOS: 2 days    Time spent: 30 mins     Charise Killian, MD [facility] Pager [phone]  If 7PM-7AM, please contact night-coverage 08/31/2020, 7:42 AM

## 2020-08-31 NOTE — Progress Notes (Signed)
Patient clinically stable post SPT 12 fr. Placement per Dr Fredia Sorrow, tolerated well. Vitals stable pre and post procedure. Denies complaints at this time awake/alert and oriented post procedure. Received Versed 2 mg along with Fentanyl 100 mcg Iv for procedure. Report given to [facility] LPN on 1A at bedside post procedure/recovery.

## 2020-09-01 DIAGNOSIS — N390 Urinary tract infection, site not specified: Secondary | ICD-10-CM | POA: Diagnosis not present

## 2020-09-01 DIAGNOSIS — G8220 Paraplegia, unspecified: Secondary | ICD-10-CM | POA: Diagnosis not present

## 2020-09-01 DIAGNOSIS — L988 Other specified disorders of the skin and subcutaneous tissue: Secondary | ICD-10-CM | POA: Diagnosis not present

## 2020-09-01 LAB — BASIC METABOLIC PANEL
Anion gap: 6 (ref 5–15)
BUN: 13 mg/dL (ref 6–20)
CO2: 28 mmol/L (ref 22–32)
Calcium: 8.7 mg/dL — ABNORMAL LOW (ref 8.9–10.3)
Chloride: 105 mmol/L (ref 98–111)
Creatinine, Ser: 0.46 mg/dL — ABNORMAL LOW (ref 0.61–1.24)
GFR, Estimated: >60 mL/min (ref >60)
Glucose, Bld: 96 mg/dL (ref 70–99)
Potassium: 3.9 mmol/L (ref 3.5–5.1)
Sodium: 139 mmol/L (ref 135–145)

## 2020-09-01 LAB — CBC
HCT: 36 % — ABNORMAL LOW (ref 39.0–52.0)
Hemoglobin: 10.8 g/dL — ABNORMAL LOW (ref 13.0–17.0)
MCH: 21.8 pg — ABNORMAL LOW (ref 26.0–34.0)
MCHC: 30 g/dL (ref 30.0–36.0)
MCV: 72.7 fL — ABNORMAL LOW (ref 80.0–100.0)
Platelets: 381 10*3/uL (ref 150–400)
RBC: 4.95 MIL/uL (ref 4.22–5.81)
RDW: 19.3 % — ABNORMAL HIGH (ref 11.5–15.5)
WBC: 6.9 10*3/uL (ref 4.0–10.5)
nRBC: 0 % (ref 0.0–0.2)

## 2020-09-01 LAB — MAGNESIUM: Magnesium: 2.1 mg/dL (ref 1.7–2.4)

## 2020-09-01 NOTE — Care Management Important Message (Signed)
Important Message  Patient Details  Name: Jonathon York MRN: [id number] Date of Birth: 12-26-1975   Medicare Important Message Given:  Yes     Jonathon York 09/01/2020, 11:48 AM

## 2020-09-01 NOTE — Progress Notes (Signed)
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 Foley exchange attempted 08/30/20 but foley end came out of sacral wound which was removed. S/p suprapubic cath placed by IR. Draining appropriately so far, will continue to monitor. No general surg needs currently as per general surg, Continue w/ wound care. Upcoming discussion for potential surgery planned at [facility] for cutaneous fistula followed by plastic surg for flap closure regarding sacral/ischial decubitus ulcers as per previous hospitalist  Paraplegia: continue w/ supportive care  Severe protein calorie malnutrition: continue w/ nutritional supplements. BMI 14.3.  Likely IDA: continue on iron supplements. H&H are stable   DVT prophylaxis: lovenox Code Status: full  Family Communication: Disposition Plan: likely d/c back home. Pt is refusing HH   Level of care: Med-Surg   Status is: Inpatient  Remains inpatient appropriate because:Ongoing diagnostic testing needed not appropriate for outpatient work up, IV treatments appropriate due to intensity of illness or inability to take PO and Inpatient level of care appropriate due to severity of illness   Dispo: The patient is from: Home              Anticipated d/c is to: Home              Patient currently is not medically stable to d/c.   Difficult to place patient Yes   Consultants:    General surg  Urology   Procedures:    Antimicrobials: rocephin    Subjective: Pt c/o malaise & chronic pain   Objective: Vitals:   08/31/20 1430 08/31/20 1602 08/31/20 2002 09/01/20 0533  BP: (!) 90/55 (!) 91/59 (!) 96/57 92/61  Pulse: 84 65 71 64  Resp:  15 18 18   Temp:  98.1 F (36.7 C) 98.2 F (36.8 C) 97.7 F (36.5 C)  TempSrc:   Oral Oral  SpO2: 97% 98% 99% 98%  Weight:      Height:        Intake/Output Summary (Last 24 hours) at 09/01/2020 0738 Last data filed at 09/01/2020 0100 Gross per 24 hour  Intake 360 ml  Output 400 ml  Net -40 ml   Filed Weights   08/28/20 1958 08/29/20 0456  Weight: 45.4 kg 45.4 kg    Examination:  General exam: Appears uncomfortable. Cachetic  Respiratory system: clear breath sounds b/l. No rales  Cardiovascular system: S1 & S2+. No rubs or clicks  Gastrointestinal system: Abd is soft, NT, ND & hypoactive bowel sounds  Central nervous system: Alert and oriented. Moves b/l UE  Psychiatry: judgement and insight appear normal. Flat mood and affect     Data Reviewed: I have personally reviewed following labs and imaging studies  CBC: Recent Labs  Lab 08/28/20 2111 08/29/20 0533 08/30/20 0523 08/31/20 0602 09/01/20 0508  WBC 13.0* 9.2 7.6 5.5 6.9  NEUTROABS 10.7*  --   --   --   --   HGB 11.9*  9.5* 9.1* 10.0* 10.8*  HCT 39.1 31.3* 30.9* 32.7* 36.0*  MCV 71.6* 72.0* 73.2* 72.5* 72.7*  PLT 514* 362 295 330 381   Basic Metabolic Panel: Recent Labs  Lab 08/28/20 2111 08/29/20 0533 08/30/20 0523 08/31/20 0602 09/01/20 0508  NA 137 138 136 140 139  K 3.2* 4.0 4.3 3.7 3.9  CL 97* 103 107 106 105  CO2 26 27 23 26 28   GLUCOSE 115* 112* 85 93 96  BUN 13 9 <5* 6 13  CREATININE 0.54* 0.47* 0.39* 0.36* 0.46*  CALCIUM 8.9 8.2* 8.4* 8.6* 8.7*  MG 2.0  --   --  2.1 2.1   GFR: Estimated Creatinine Clearance: 74.9 mL/min (A) (by C-G formula based on SCr of 0.46 mg/dL (L)). Liver Function Tests: Recent Labs  Lab  08/28/20 2111 08/30/20 0523  AST 15 8*  ALT 11 8  ALKPHOS 70 56  BILITOT 0.9 0.5  PROT 7.7 6.0*  ALBUMIN 3.6 2.8*   Recent Labs  Lab 08/29/20 0533  LIPASE 30   No results for input(s): AMMONIA in the last 168 hours. Coagulation Profile: No results for input(s): INR, PROTIME in the last 168 hours. Cardiac Enzymes: No results for input(s): CKTOTAL, CKMB, CKMBINDEX, TROPONINI in the last 168 hours. BNP (last 3 results) No results for input(s): PROBNP in the last 8760 hours. HbA1C: No results for input(s): HGBA1C in the last 72 hours. CBG: No results for input(s): GLUCAP in the last 168 hours. Lipid Profile: No results for input(s): CHOL, HDL, LDLCALC, TRIG, CHOLHDL, LDLDIRECT in the last 72 hours. Thyroid Function Tests: No results for input(s): TSH, T4TOTAL, FREET4, T3FREE, THYROIDAB in the last 72 hours. Anemia Panel: Recent Labs    08/31/20 0602  FERRITIN 39  TIBC 211*  IRON 21*   Sepsis Labs: Recent Labs  Lab 08/28/20 2231  LATICACIDVEN 1.6    Recent Results (from the past 240 hour(s))  Resp Panel by RT-PCR (Flu A&B, Covid) Nasopharyngeal Swab     Status: None   Collection Time: 08/28/20 10:31 PM   Specimen: Nasopharyngeal Swab; Nasopharyngeal(NP) swabs in vial transport medium  Result Value Ref Range Status   SARS Coronavirus 2 by RT PCR NEGATIVE NEGATIVE Final    Comment: (NOTE) SARS-CoV-2 target nucleic acids are NOT DETECTED.  The SARS-CoV-2 RNA is generally detectable in upper respiratory specimens during the acute phase of infection. The lowest concentration of SARS-CoV-2 viral copies this assay can detect is 138 copies/mL. A negative result does not preclude SARS-Cov-2 infection and should not be used as the sole basis for treatment or other patient management decisions. A negative result may occur with  improper specimen collection/handling, submission of specimen other than nasopharyngeal swab, presence of viral mutation(s) within the areas  targeted by this assay, and inadequate number of viral copies(<138 copies/mL). A negative result must be combined with clinical observations, patient history, and epidemiological information. The expected result is Negative.  Fact Sheet for Patients:  BloggerCourse.comhttps://www.fda.gov/media/152166/download  Fact Sheet for Healthcare Providers:  SeriousBroker.ithttps://www.fda.gov/media/152162/download  This test is no t yet approved or cleared by the [place] FDA and  has been authorized for detection and/or diagnosis of SARS-CoV-2 by FDA under an Emergency Use Authorization (EUA). This EUA will remain  in effect (meaning this test can be used) for the duration of the COVID-19 declaration under Section 564(b)(1) of the Act, 21 U.S.C.section 360bbb-3(b)(1), unless the authorization is terminated  or revoked sooner.       Influenza A by PCR NEGATIVE NEGATIVE  Final   Influenza B by PCR NEGATIVE NEGATIVE Final    Comment: (NOTE) The Xpert Xpress SARS-CoV-2/FLU/RSV plus assay is intended as an aid in the diagnosis of influenza from Nasopharyngeal swab specimens and should not be used as a sole basis for treatment. Nasal washings and aspirates are unacceptable for Xpert Xpress SARS-CoV-2/FLU/RSV testing.  Fact Sheet for Patients: BloggerCourse.com  Fact Sheet for Healthcare Providers: SeriousBroker.it  This test is not yet approved or cleared by the [place] FDA and has been authorized for detection and/or diagnosis of SARS-CoV-2 by FDA under an Emergency Use Authorization (EUA). This EUA will remain in effect (meaning this test can be used) for the duration of the COVID-19 declaration under Section 564(b)(1) of the Act, 21 U.S.C. section 360bbb-3(b)(1), unless the authorization is terminated or revoked.  Performed at [facility], [address] [place], [place] [address]   Blood culture (routine single)     Status: None (Preliminary  result)   Collection Time: 08/28/20 10:31 PM   Specimen: BLOOD  Result Value Ref Range Status   Specimen Description BLOOD RIGHT ANTECUBITAL  Final   Special Requests   Final    BOTTLES DRAWN AEROBIC AND ANAEROBIC Blood Culture adequate volume   Culture   Final    NO GROWTH 4 DAYS Performed at [facility], [address] [place], [place] [address]    Report Status PENDING  Incomplete  Urine culture     Status: Abnormal   Collection Time: 08/28/20 10:31 PM   Specimen: In/Out Cath Urine  Result Value Ref Range Status   Specimen Description   Final    IN/OUT CATH URINE Performed at [facility], [address], [place], [place] [address]    Special Requests   Final    NONE Performed at [facility], [address] [place], [place] [address]    Culture MULTIPLE SPECIES PRESENT, SUGGEST RECOLLECTION (A)  Final   Report Status 08/30/2020 FINAL  Final         Radiology Studies: CT IMAGE GUIDED DRAINAGE BY PERCUTANEOUS CATHETER  Result Date: 08/31/2020 INDICATION: Urethral fistula to pelvic floor with inability to place Foley catheter. Need for suprapubic bladder drainage prior to surgical repair of urethral fistula. EXAM: CT-GUIDED PLACEMENT OF SUPRAPUBIC DRAINAGE CATHETER IN BLADDER COMPARISON:  None. MEDICATIONS: 2 g IV Ancef; The antibiotic was administered in an appropriate time frame prior to skin puncture. ANESTHESIA/SEDATION: Fentanyl 100 mcg IV; Versed 2.0 mg IV Moderate Sedation Time:  20 minutes. The patient was continuously monitored during the procedure by the interventional radiology nurse under my direct supervision. CONTRAST:  None FLUOROSCOPY TIME:  CT guidance. COMPLICATIONS: None immediate. PROCEDURE: Informed written consent was obtained from the patient after a thorough discussion of the procedural risks, benefits and alternatives. All questions were addressed. Maximal Sterile Barrier Technique was utilized including caps, mask, sterile  gowns, sterile gloves, sterile drape, hand hygiene and skin antiseptic. A timeout was performed prior to the initiation of the procedure. Supine imaging of the pelvis was performed. Skin overlying the suprapubic region was prepped with chlorhexidine. 1% lidocaine was infiltrated for local anesthesia. An 18 gauge trocar needle was advanced into the bladder lumen from an anterior suprapubic approach. After confirming needle tip position and return of urine, a guidewire was advanced into the bladder lumen. The percutaneous tract was dilated over the wire and a 12 French pigtail drainage catheter advanced over the wire. Catheter positioning was confirmed by CT. The catheter was connected  to a gravity drainage bag and secured at the skin with a Prolene retention suture and StatLock device. FINDINGS: Catheter distension of the bladder lumen was not possible due to the severe urethral fistula and breakdown present. There was enough urine volume in the bladder lumen to allow suprapubic catheter placement. The catheter is formed in the bladder lumen after placement and is draining blood tinged urine initially after placement. IMPRESSION: CT-guided suprapubic bladder drainage catheter placement. A 12 French pigtail drainage catheter was placed in the bladder lumen from a suprapubic approach and attached to gravity bag drainage. Electronically Signed   By: Irish Lack M.D.   On: 08/31/2020 12:07        Scheduled Meds: . vitamin C  250 mg Oral BID  . Chlorhexidine Gluconate Cloth  6 each Topical Daily  . feeding supplement  1 Container Oral TID BM  . ferrous gluconate  324 mg Oral BID WC  . multivitamin-lutein  1 capsule Oral Daily   Continuous Infusions: . cefTRIAXone (ROCEPHIN)  IV 1 g (08/31/20 2149)     LOS: 3 days    Time spent: 33 mins     Charise Killian, MD [facility] Pager [phone]  If 7PM-7AM, please contact night-coverage 09/01/2020, 7:38 AM

## 2020-09-02 DIAGNOSIS — N390 Urinary tract infection, site not specified: Secondary | ICD-10-CM | POA: Diagnosis not present

## 2020-09-02 DIAGNOSIS — G8220 Paraplegia, unspecified: Secondary | ICD-10-CM | POA: Diagnosis not present

## 2020-09-02 DIAGNOSIS — L988 Other specified disorders of the skin and subcutaneous tissue: Secondary | ICD-10-CM | POA: Diagnosis not present

## 2020-09-02 LAB — CULTURE, BLOOD (SINGLE)
Culture: NO GROWTH
Special Requests: ADEQUATE

## 2020-09-02 LAB — BASIC METABOLIC PANEL
Anion gap: 7 (ref 5–15)
BUN: 12 mg/dL (ref 6–20)
CO2: 25 mmol/L (ref 22–32)
Calcium: 8.4 mg/dL — ABNORMAL LOW (ref 8.9–10.3)
Chloride: 105 mmol/L (ref 98–111)
Creatinine, Ser: 0.32 mg/dL — ABNORMAL LOW (ref 0.61–1.24)
GFR, Estimated: >60 mL/min (ref >60)
Glucose, Bld: 89 mg/dL (ref 70–99)
Potassium: 3.6 mmol/L (ref 3.5–5.1)
Sodium: 137 mmol/L (ref 135–145)

## 2020-09-02 LAB — CBC
HCT: 35.5 % — ABNORMAL LOW (ref 39.0–52.0)
Hemoglobin: 10.8 g/dL — ABNORMAL LOW (ref 13.0–17.0)
MCH: 22.1 pg — ABNORMAL LOW (ref 26.0–34.0)
MCHC: 30.4 g/dL (ref 30.0–36.0)
MCV: 72.6 fL — ABNORMAL LOW (ref 80.0–100.0)
Platelets: 329 10*3/uL (ref 150–400)
RBC: 4.89 MIL/uL (ref 4.22–5.81)
RDW: 19.8 % — ABNORMAL HIGH (ref 11.5–15.5)
WBC: 5.9 10*3/uL (ref 4.0–10.5)
nRBC: 0 % (ref 0.0–0.2)

## 2020-09-02 LAB — MAGNESIUM: Magnesium: 2.1 mg/dL (ref 1.7–2.4)

## 2020-09-02 MED ORDER — HYDROCODONE-ACETAMINOPHEN 5-325 MG PO TABS: 2.0000 | ORAL_TABLET | Freq: Four times a day (QID) | ORAL | 0 refills | Status: AC | PRN

## 2020-09-02 MED ORDER — CIPROFLOXACIN HCL 500 MG PO TABS: 500.0000 mg | ORAL_TABLET | Freq: Two times a day (BID) | ORAL | 0 refills | Status: AC

## 2020-09-02 MED ORDER — FERROUS GLUCONATE 324 (38 FE) MG PO TABS: 324.0000 mg | ORAL_TABLET | Freq: Two times a day (BID) | ORAL | 0 refills | Status: DC

## 2020-09-02 NOTE — Plan of Care (Signed)
Patient had an uneventful shift. No changes in neurological and neurovascular assessments. Pain controlled with PRN medications. Vital signs within normal range.Denies any needs at this time. All Safety measures maintained. Care continues.  Problem: Education: Goal: Knowledge of General Education information will improve Description: Including pain rating scale, medication(s)/side effects and non-pharmacologic comfort measures Outcome: Progressing   Problem: Health Behavior/Discharge Planning: Goal: Ability to manage health-related needs will improve Outcome: Progressing   Problem: Clinical Measurements: Goal: Ability to maintain clinical measurements within normal limits will improve Outcome: Progressing Goal: Will remain free from infection Outcome: Progressing Goal: Diagnostic test results will improve Outcome: Progressing Goal: Respiratory complications will improve Outcome: Progressing Goal: Cardiovascular complication will be avoided Outcome: Progressing   Problem: Activity: Goal: Risk for activity intolerance will decrease Outcome: Progressing   Problem: Nutrition: Goal: Adequate nutrition will be maintained Outcome: Progressing   Problem: Coping: Goal: Level of anxiety will decrease Outcome: Progressing   Problem: Elimination: Goal: Will not experience complications related to bowel motility Outcome: Progressing Goal: Will not experience complications related to urinary retention Outcome: Progressing   Problem: Pain Managment: Goal: General experience of comfort will improve Outcome: Progressing   Problem: Safety: Goal: Ability to remain free from injury will improve Outcome: Progressing   Problem: Skin Integrity: Goal: Risk for impaired skin integrity will decrease Outcome: Progressing   

## 2020-09-02 NOTE — Progress Notes (Signed)
DME Wheelchair: Patient suffers from  paraplegia which impairs their ability to perform daily activities like ADLs in the home.  A walking aid will not resolve issue with performing activities of daily living. A wheelchair will allow patient to safely perform daily activities. Patient is not able to propel themselves in the home using a standard weight wheelchair due to weakness. Patient can self propel in the lightweight wheelchair. Length of need 99 months. Accessories: elevating leg rests ELRs, wheel locks, extensions and anti-tippers back cusion.

## 2020-09-02 NOTE — Clinical Social Work Note (Cosign Needed)
    Durable Medical Equipment  (From admission, onward)         Start     Ordered   09/02/20 0000  For home use only DME standard manual wheelchair with seat cushion       Comments: Patient suffers from being paraplegic which impairs their ability to perform daily activities like cooking, cleaning, dressing, bathing in the home.  A cane, walker will not resolve issue with performing activities of daily living. A wheelchair will allow patient to safely perform daily activities. Patient can safely propel the wheelchair in the home or has a caregiver who can provide assistance. Length of need: lifetime  Accessories: elevating leg rests (ELRs), wheel locks, extensions and anti-tippers.   09/02/20 1155   08/30/20 1253  For home use only DME lightweight manual wheelchair with seat cushion  Once       Comments: Patient suffers from  paraplegia which impairs their ability to perform daily activities like ADLs in the home.  A walking aid will not resolve issue with performing activities of daily living. A wheelchair will allow patient to safely perform daily activities. Patient is not able to propel themselves in the home using a standard weight wheelchair due to weakness. Patient can self propel in the lightweight wheelchair. Length of need 99 months. Accessories: elevating leg rests ELRs, wheel locks, extensions and anti-tippers back cusion.   08/30/20 1254

## 2020-09-02 NOTE — Discharge Summary (Signed)
Physician Discharge Summary  Jonathon York UTM:[id number] DOB: [birth year] DOA: 08/28/2020  PCP: Mick Sell, MD  Admit date: 08/28/2020 Discharge date: 09/02/2020  Admitted From: home  Disposition:  Home   Recommendations for Outpatient Follow-up:  1. Follow up with PCP in 1-2 weeks 2. F/u w/ urology at [facility] on 09/21/20  Home Health: no as pt refused home health  Equipment/Devices:  Discharge Condition: stable  CODE STATUS: full  Diet recommendation: regular   Brief/Interim Summary: HPI was taken from Dr. Para March: Jonathon York is a 45 y.o. 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  Patient was hospitalized for UTI in August 2021 presenting in a similar manner, growing 10,000 colonies of E faecalis.  Patient denies diarrhea, fever or chills, cough or shortness of breath ED course: On arrival afebrile, BP 113/84, pulse 108 with O2 sat 99% on room air.  Blood work significant for leukocytosis of 13,000 with lactic acid of 1.6.  Potassium 3.2.  Urinalysis with turbid urine, moderate leukocyte esterase and many bacteria Imaging: CT abdomen and pelvis showing mostly chronic findings including extensive decubitus ulceration chronic osteomyelitis, bilateral hip effusions and possible cystitis  Patient started on antibiotics and IV fluids.  And hospitalist consulted for admission  Hospital course from Dr. Mayford Knife 5/4-09/02/20: Pt was found to have complicated UTI and received IV ceftriaxone while inpatient and switched to po cipro at d/c to finish the course. Of note, a foley exchange was attempted on 08/30/20 but the foley end came out of the pt's sacral wound which was then removed.  Urology was consulted and recommended suprapubic cath placement. Pt had a suprapubic cath placed by IR. The suprapubic cath was draining properly and the pt's nurse gave the pt instructions on how to care/empty the suprapubic catheter. General surgery was consulted but there were no acute general surg needs. Pt had an upcoming appt at [facility] urology for cystoscopy and discussion for surgery for his cutaneous fistula on Sep 21, 2020. Of note, urology reached out to the pt's urologist at [facility] to give them an update on the pt. For more information, please see the previous/consult notes.   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 Foley exchange attempted 08/30/20 but foley end came out of sacral wound which was removed. S/p suprapubic cath placed by IR. Draining appropriately so far, will continue to monitor. No general surg needs currently as per general surg, Continue w/ wound care. Upcoming discussion for potential surgery planned at [facility] for cutaneous fistula followed by plastic surg for flap closure regarding sacral/ischial decubitus ulcers as per previous hospitalist  Paraplegia: continue w/ supportive care. The script for a new wheelchair was sent to [facility] by CM  Severe protein calorie malnutrition: continue w/ nutritional supplements. BMI  14.3.  Likely IDA: continue on iron supplements. H&H are stable    Discharge Instructions  Discharge Instructions    Diet - low sodium heart healthy    Complete by: As directed    Discharge instructions   Complete by: As directed    F/u w/ PCP in 1-2 weeks. F/u urology at [facility] on 09/21/20   Discharge wound care:   Complete by: As directed    Clean all wounds with betadine, pat dry.  Pack wound including any undermining with dry gauze, top with ABD pads, secure with medifix tape. Change daily and change topper dressings PRN strike through.   For home use only DME standard manual wheelchair with seat cushion   Complete by: As directed    Patient suffers from being paraplegic which impairs their ability to perform daily activities like cooking, cleaning, dressing, bathing in the home.  A cane, walker will not resolve issue with performing activities of daily living. A wheelchair will allow patient to safely perform daily activities. Patient can safely propel the wheelchair in the home or has a caregiver who can provide assistance. Length of need: lifetime  Accessories: elevating leg rests (ELRs), wheel locks, extensions and anti-tippers.   Increase activity slowly   Complete by: As directed      Allergies as of 09/02/2020      Reactions   Orange Fruit [citrus] Other (See Comments)   Blisters only from ORANGES!!! Patient is not allergic from all citrus   Sulfa Antibiotics    Throwing up blood       Medication List    TAKE these medications   ciprofloxacin 500 MG tablet Commonly known as: Cipro Take 1 tablet (500 mg total) by mouth 2 (two) times daily for 3 days.   ferrous gluconate 324 MG tablet Commonly known as: FERGON Take 1 tablet (324 mg total) by mouth 2 (two) times daily with a meal.   HYDROcodone-acetaminophen 5-325 MG tablet Commonly known as: NORCO/VICODIN Take 2 tablets by mouth every 6 (six) hours as needed for up to 3 days for moderate pain or severe pain.            Durable Medical Equipment  (From admission, onward)         Start     Ordered   09/02/20 0000  For home use only DME standard manual wheelchair  with seat cushion       Comments: Patient suffers from being paraplegic which impairs their ability to perform daily activities like cooking, cleaning, dressing, bathing in the home.  A cane, walker will not resolve issue with performing activities of daily living. A wheelchair will allow patient to safely perform daily activities. Patient can safely propel the wheelchair in the home or has a caregiver who can provide assistance. Length of need: lifetime  Accessories: elevating leg rests (ELRs), wheel locks, extensions and anti-tippers.   09/02/20 1155   08/30/20 1253  For home use only DME lightweight manual wheelchair with seat cushion  Once       Comments: Patient suffers from  paraplegia which impairs their ability to perform daily activities like ADLs in the home.  A walking aid will not resolve issue with performing activities of daily living. A wheelchair will allow patient to safely perform daily activities. Patient is not able to propel themselves in the home using a standard weight wheelchair due to weakness. Patient can self propel in the lightweight wheelchair. Length of need 99 months. Accessories: elevating leg rests  ELRs, wheel locks, extensions and anti-tippers back cusion.   08/30/20 1254           Discharge Care Instructions  (From admission, onward)         Start     Ordered   09/02/20 0000  Discharge wound care:       Comments: Clean all wounds with betadine, pat dry.  Pack wound including any undermining with dry gauze, top with ABD pads, secure with medifix tape. Change daily and change topper dressings PRN strike through.   09/02/20 1155          Allergies  Allergen Reactions  . Orange Fruit [Citrus] Other (See Comments)    Blisters only from ORANGES!!! Patient is not allergic from all citrus  . Sulfa Antibiotics     Throwing up blood     Consultations:  General surg  Urology   IR   Procedures/Studies: CT ABDOMEN PELVIS W CONTRAST  Result Date:  08/29/2020 CLINICAL DATA:  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 Para paresis of both lower extremities. Neurogenic bladder. Prior osteomyelitis a 5 King shin at EXAM: CT ABDOMEN AND PELVIS WITH CONTRAST TECHNIQUE: Multidetector CT imaging of the abdomen and pelvis was performed using the standard protocol following bolus administration of intravenous contrast. CONTRAST:  OMNIPAQUE IOHEXOL 300 MG/ML  SOLN COMPARISON:  CT 06/26/2020 FINDINGS: Lower chest: Lung bases are clear. Normal heart size. No pericardial effusion. Pectus deformity of the chest. Hepatobiliary: Small subcentimeter hypoattenuating foci in the right lobe liver, too small to fully characterize on CT imaging but statistically likely benign. No concerning focal liver lesion. Smooth liver surface contour. Normal gallbladder and biliary tree. Pancreas: No pancreatic ductal dilatation or surrounding inflammatory changes. Spleen: Normal in size. No concerning splenic lesions. Adrenals/Urinary Tract: Normal adrenal glands. Multiple fluid attenuation cysts present in both kidneys with few additional smaller subcentimeter hypoattenuating foci too small to characterize though statistically likely benign. No concerning focal renal lesion. Kidneys enhance and excrete symmetrically. No urolithiasis or hydronephrosis. Urinary bladder is circumferentially thickened with intraluminal gas. Inflated Foley catheter is in place. Redemonstration of direct cutaneous fistulization from decubitus ulceration to portion of the urethra (2/81). Stomach/Bowel: Distal esophagus, stomach and duodenum are unremarkable. No large or small bowel thickening or dilatation. No evidence of obstruction. Normal appendix. There is significant rectal wall thickening contiguous with some ulceration and skin thickening around the anal verge Vascular/Lymphatic: Atherosclerotic calcifications within the abdominal aorta and branch vessels. No aneurysm or  ectasia. No pathologically enlarged abdominopelvic lymph nodes. Reactive appearing adenopathy noted in the bilateral inguinal chains and along the pelvic sidewalls and mesorectal fat. Reproductive: Enlarged prostate with heterogeneous enhancement. Seminal vesicles are unremarkable. Extensive ulceration of the pudendal tissues with directs cutaneous fistulization to the base of the scrotum and penile corpora (2/81). Few foci of gas noted along the urethra as well. Other: Extensive soft tissue ulceration superficial to the sacrum and along the ischial tuberosities bilateral including more focal thickening and deep ulceration which extends into the pudendal soft tissues, to the base of the penis and scrotum with direct extension along the urethra. Musculoskeletal: Extensive sclerotic and destructive changes involving the bilateral is CT, right greater than left. Bilateral hip effusions are noted with synovial thickening and surrounding inflammatory change. Septic arthritis not fully excluded. Additional sclerotic changes and bony remodeling along the sacrum, posterior left ilium as well with overlying cutaneous thickening and decubitus ulceration. Thoracolumbar fusion is again noted with hardware spanning a T11 vertebral  body fracture with cord compression, overall unchanged in configuration from comparison exam. No acute complication is seen. Long segment bony fusion of the posterior elements. IMPRESSION: Extensive decubitus ulceration across the sacrum as well as the bilateral ischial tuberosities with soft tissue thickening and subjacent regions of sclerotic and destructive osseous changes suggesting sequela of chronic and or remote osteomyelitis though some additional acute bony infection may be present. Bilateral hip effusions with extensive synovial thickening are present as well, sterility of these hip collections is not ascertained on imaging alone. Could consider sampling. More focal region of direct cutaneous  ulceration and soft tissue defect is seen extending from the right ischial ulceration into the pudendal tissues towards the base of the penis and scrotum with direct extension and involvement of what is likely the membranous portion of the urethra. Associated soft tissue gas is noted along the read through itself with intraluminal gas in the urinary bladder as well as significant circumferential bladder wall thickening likely reflecting a superimposed cystitis. Prostatomegaly with heterogeneous enhancement, possibly related to contrast timing though a superimposed prostatitis is certainly likely given additional findings above. Electronically Signed   By: Kreg Shropshire M.D.   On: 08/29/2020 00:03   DG Chest Port 1 View  Result Date: 08/28/2020 CLINICAL DATA:  Nausea, vomiting, paraplegia, concern for sepsis EXAM: PORTABLE CHEST 1 VIEW COMPARISON:  03/18/2018 chest radiograph. FINDINGS: Partially visualized bilateral posterior spinal fusion hardware extending inferiorly from the midthoracic spine. Stable cardiomediastinal silhouette with normal heart size. No pneumothorax. No pleural effusion. No pulmonary edema. New tiny nodular 4 mm opacity in the peripheral lower right lung. Otherwise clear lungs. IMPRESSION: New tiny nodular 4 mm opacity in the peripheral lower right lung, cannot exclude a pulmonary nodule. Otherwise no active disease in the chest. Suggest short-term outpatient follow-up chest CT. Electronically Signed   By: Delbert Phenix M.D.   On: 08/28/2020 22:03   CT IMAGE GUIDED DRAINAGE BY PERCUTANEOUS CATHETER  Result Date: 08/31/2020 INDICATION: Urethral fistula to pelvic floor with inability to place Foley catheter. Need for suprapubic bladder drainage prior to surgical repair of urethral fistula. EXAM: CT-GUIDED PLACEMENT OF SUPRAPUBIC DRAINAGE CATHETER IN BLADDER COMPARISON:  None. MEDICATIONS: 2 g IV Ancef; The antibiotic was administered in an appropriate time frame prior to skin puncture.  ANESTHESIA/SEDATION: Fentanyl 100 mcg IV; Versed 2.0 mg IV Moderate Sedation Time:  20 minutes. The patient was continuously monitored during the procedure by the interventional radiology nurse under my direct supervision. CONTRAST:  None FLUOROSCOPY TIME:  CT guidance. COMPLICATIONS: None immediate. PROCEDURE: Informed written consent was obtained from the patient after a thorough discussion of the procedural risks, benefits and alternatives. All questions were addressed. Maximal Sterile Barrier Technique was utilized including caps, mask, sterile gowns, sterile gloves, sterile drape, hand hygiene and skin antiseptic. A timeout was performed prior to the initiation of the procedure. Supine imaging of the pelvis was performed. Skin overlying the suprapubic region was prepped with chlorhexidine. 1% lidocaine was infiltrated for local anesthesia. An 18 gauge trocar needle was advanced into the bladder lumen from an anterior suprapubic approach. After confirming needle tip position and return of urine, a guidewire was advanced into the bladder lumen. The percutaneous tract was dilated over the wire and a 12 French pigtail drainage catheter advanced over the wire. Catheter positioning was confirmed by CT. The catheter was connected to a gravity drainage bag and secured at the skin with a Prolene retention suture and StatLock device. FINDINGS: Catheter distension of the bladder  lumen was not possible due to the severe urethral fistula and breakdown present. There was enough urine volume in the bladder lumen to allow suprapubic catheter placement. The catheter is formed in the bladder lumen after placement and is draining blood tinged urine initially after placement. IMPRESSION: CT-guided suprapubic bladder drainage catheter placement. A 12 French pigtail drainage catheter was placed in the bladder lumen from a suprapubic approach and attached to gravity bag drainage. Electronically Signed   By: Irish Lack M.D.   On:  08/31/2020 12:07      Subjective: Pt c/o chronic pain    Discharge Exam: Vitals:   09/02/20 0411 09/02/20 0849  BP: (!) 95/58 106/73  Pulse: 65 90  Resp: 17 20  Temp: (!) 97.5 F (36.4 C) 98 F (36.7 C)  SpO2: 98% 100%   Vitals:   09/01/20 1548 09/01/20 2016 09/02/20 0411 09/02/20 0849  BP: 100/63 109/76 (!) 95/58 106/73  Pulse: 72 83 65 90  Resp: 15 16 17 20   Temp: 98.2 F (36.8 C) 98.4 F (36.9 C) (!) 97.5 F (36.4 C) 98 F (36.7 C)  TempSrc: Oral Oral Oral Oral  SpO2: 99% 99% 98% 100%  Weight:      Height:        General: Pt is alert, awake, not in acute distress. Cachetic  Cardiovascular: S1/S2 +, no rubs, no gallops Respiratory: decreased breath sounds b/l Abdominal: Soft, NT, ND, bowel sounds + Extremities: no edema, no cyanosis    The results of significant diagnostics from this hospitalization (including imaging, microbiology, ancillary and laboratory) are listed below for reference.     Microbiology: Recent Results (from the past 240 hour(s))  Resp Panel by RT-PCR (Flu A&B, Covid) Nasopharyngeal Swab     Status: None   Collection Time: 08/28/20 10:31 PM   Specimen: Nasopharyngeal Swab; Nasopharyngeal(NP) swabs in vial transport medium  Result Value Ref Range Status   SARS Coronavirus 2 by RT PCR NEGATIVE NEGATIVE Final    Comment: (NOTE) SARS-CoV-2 target nucleic acids are NOT DETECTED.  The SARS-CoV-2 RNA is generally detectable in upper respiratory specimens during the acute phase of infection. The lowest concentration of SARS-CoV-2 viral copies this assay can detect is 138 copies/mL. A negative result does not preclude SARS-Cov-2 infection and should not be used as the sole basis for treatment or other patient management decisions. A negative result may occur with  improper specimen collection/handling, submission of specimen other than nasopharyngeal swab, presence of viral mutation(s) within the areas targeted by this assay, and inadequate  number of viral copies(<138 copies/mL). A negative result must be combined with clinical observations, patient history, and epidemiological information. The expected result is Negative.  Fact Sheet for Patients:  10/28/20  Fact Sheet for Healthcare Providers:  BloggerCourse.com  This test is no t yet approved or cleared by the [place] FDA and  has been authorized for detection and/or diagnosis of SARS-CoV-2 by FDA under an Emergency Use Authorization (EUA). This EUA will remain  in effect (meaning this test can be used) for the duration of the COVID-19 declaration under Section 564(b)(1) of the Act, 21 U.S.C.section 360bbb-3(b)(1), unless the authorization is terminated  or revoked sooner.       Influenza A by PCR NEGATIVE NEGATIVE Final   Influenza B by PCR NEGATIVE NEGATIVE Final    Comment: (NOTE) The Xpert Xpress SARS-CoV-2/FLU/RSV plus assay is intended as an aid in the diagnosis of influenza from Nasopharyngeal swab specimens and should not be used as  a sole basis for treatment. Nasal washings and aspirates are unacceptable for Xpert Xpress SARS-CoV-2/FLU/RSV testing.  Fact Sheet for Patients: BloggerCourse.comhttps://www.fda.gov/media/152166/download  Fact Sheet for Healthcare Providers: SeriousBroker.ithttps://www.fda.gov/media/152162/download  This test is not yet approved or cleared by the [place] FDA and has been authorized for detection and/or diagnosis of SARS-CoV-2 by FDA under an Emergency Use Authorization (EUA). This EUA will remain in effect (meaning this test can be used) for the duration of the COVID-19 declaration under Section 564(b)(1) of the Act, 21 U.S.C. section 360bbb-3(b)(1), unless the authorization is terminated or revoked.  Performed at [facility], [address] [place], [place] [address]   Blood culture (routine single)     Status: None   Collection Time: 08/28/20 10:31 PM   Specimen:  BLOOD  Result Value Ref Range Status   Specimen Description BLOOD RIGHT ANTECUBITAL  Final   Special Requests   Final    BOTTLES DRAWN AEROBIC AND ANAEROBIC Blood Culture adequate volume   Culture   Final    NO GROWTH 5 DAYS Performed at [facility], [address] [place], [place] [address]    Report Status 09/02/2020 FINAL  Final  Urine culture     Status: Abnormal   Collection Time: 08/28/20 10:31 PM   Specimen: In/Out Cath Urine  Result Value Ref Range Status   Specimen Description   Final    IN/OUT CATH URINE Performed at [facility], [address] [place], [place] [address]    Special Requests   Final    NONE Performed at [facility], [address] [place], [place] [address]    Culture MULTIPLE SPECIES PRESENT, SUGGEST RECOLLECTION (A)  Final   Report Status 08/30/2020 FINAL  Final     Labs: BNP (last 3 results) Recent Labs    10/27/19 0115  BNP 41.1   Basic Metabolic Panel: Recent Labs  Lab 08/28/20 2111 08/29/20 0533 08/30/20 0523 08/31/20 0602 09/01/20 0508 09/02/20 0518  NA 137 138 136 140 139 137  K 3.2* 4.0 4.3 3.7 3.9 3.6  CL 97* 103 107 106 105 105  CO2 26 27 23 26 28 25   GLUCOSE 115* 112* 85 93 96 89  BUN 13 9 <5* 6 13 12   CREATININE 0.54* 0.47* 0.39* 0.36* 0.46* 0.32*  CALCIUM 8.9 8.2* 8.4* 8.6* 8.7* 8.4*  MG 2.0  --   --  2.1 2.1 2.1   Liver Function Tests: Recent Labs  Lab 08/28/20 2111 08/30/20 0523  AST 15 8*  ALT 11 8  ALKPHOS 70 56  BILITOT 0.9 0.5  PROT 7.7 6.0*  ALBUMIN 3.6 2.8*   Recent Labs  Lab 08/29/20 0533  LIPASE 30   No results for input(s): AMMONIA in the last 168 hours. CBC: Recent Labs  Lab 08/28/20 2111 08/29/20 0533 08/30/20 0523 08/31/20 0602 09/01/20 0508 09/02/20 0518  WBC 13.0* 9.2 7.6 5.5 6.9 5.9  NEUTROABS 10.7*  --   --   --   --   --   HGB 11.9* 9.5* 9.1* 10.0* 10.8* 10.8*  HCT 39.1 31.3* 30.9* 32.7* 36.0* 35.5*  MCV 71.6* 72.0* 73.2* 72.5* 72.7* 72.6*   PLT 514* 362 295 330 381 329   Cardiac Enzymes: No results for input(s): CKTOTAL, CKMB, CKMBINDEX, TROPONINI in the last 168 hours. BNP: Invalid input(s): POCBNP CBG: No results for input(s): GLUCAP in the last 168 hours. D-Dimer No results for input(s): DDIMER in the last 72 hours. Hgb A1c No results for input(s):  HGBA1C in the last 72 hours. Lipid Profile No results for input(s): CHOL, HDL, LDLCALC, TRIG, CHOLHDL, LDLDIRECT in the last 72 hours. Thyroid function studies No results for input(s): TSH, T4TOTAL, T3FREE, THYROIDAB in the last 72 hours.  Invalid input(s): FREET3 Anemia work up Recent Labs    08/31/20 0602  FERRITIN 39  TIBC 211*  IRON 21*   Urinalysis    Component Value Date/Time   COLORURINE AMBER (A) 08/28/2020 2231   APPEARANCEUR TURBID (A) 08/28/2020 2231   APPEARANCEUR Turbid 12/24/2013 0313   LABSPEC 1.026 08/28/2020 2231   LABSPEC 1.021 12/24/2013 0313   PHURINE 8.0 08/28/2020 2231   GLUCOSEU NEGATIVE 08/28/2020 2231   GLUCOSEU Negative 12/24/2013 0313   HGBUR NEGATIVE 08/28/2020 2231   BILIRUBINUR NEGATIVE 08/28/2020 2231   BILIRUBINUR Negative 12/24/2013 0313   KETONESUR 5 (A) 08/28/2020 2231   PROTEINUR 100 (A) 08/28/2020 2231   NITRITE NEGATIVE 08/28/2020 2231   LEUKOCYTESUR MODERATE (A) 08/28/2020 2231   LEUKOCYTESUR 1+ 12/24/2013 0313   Sepsis Labs Invalid input(s): PROCALCITONIN,  WBC,  LACTICIDVEN Microbiology Recent Results (from the past 240 hour(s))  Resp Panel by RT-PCR (Flu A&B, Covid) Nasopharyngeal Swab     Status: None   Collection Time: 08/28/20 10:31 PM   Specimen: Nasopharyngeal Swab; Nasopharyngeal(NP) swabs in vial transport medium  Result Value Ref Range Status   SARS Coronavirus 2 by RT PCR NEGATIVE NEGATIVE Final    Comment: (NOTE) SARS-CoV-2 target nucleic acids are NOT DETECTED.  The SARS-CoV-2 RNA is generally detectable in upper respiratory specimens during the acute phase of infection. The  lowest concentration of SARS-CoV-2 viral copies this assay can detect is 138 copies/mL. A negative result does not preclude SARS-Cov-2 infection and should not be used as the sole basis for treatment or other patient management decisions. A negative result may occur with  improper specimen collection/handling, submission of specimen other than nasopharyngeal swab, presence of viral mutation(s) within the areas targeted by this assay, and inadequate number of viral copies(<138 copies/mL). A negative result must be combined with clinical observations, patient history, and epidemiological information. The expected result is Negative.  Fact Sheet for Patients:  BloggerCourse.com  Fact Sheet for Healthcare Providers:  SeriousBroker.it  This test is no t yet approved or cleared by the [place] FDA and  has been authorized for detection and/or diagnosis of SARS-CoV-2 by FDA under an Emergency Use Authorization (EUA). This EUA will remain  in effect (meaning this test can be used) for the duration of the COVID-19 declaration under Section 564(b)(1) of the Act, 21 U.S.C.section 360bbb-3(b)(1), unless the authorization is terminated  or revoked sooner.       Influenza A by PCR NEGATIVE NEGATIVE Final   Influenza B by PCR NEGATIVE NEGATIVE Final    Comment: (NOTE) The Xpert Xpress SARS-CoV-2/FLU/RSV plus assay is intended as an aid in the diagnosis of influenza from Nasopharyngeal swab specimens and should not be used as a sole basis for treatment. Nasal washings and aspirates are unacceptable for Xpert Xpress SARS-CoV-2/FLU/RSV testing.  Fact Sheet for Patients: BloggerCourse.com  Fact Sheet for Healthcare Providers: SeriousBroker.it  This test is not yet approved or cleared by the [place] FDA and has been authorized for detection and/or diagnosis of SARS-CoV-2 by FDA under  an Emergency Use Authorization (EUA). This EUA will remain in effect (meaning this test can be used) for the duration of the COVID-19 declaration under Section 564(b)(1) of the Act, 21 U.S.C. section 360bbb-3(b)(1), unless the authorization is terminated  or revoked.  Performed at [facility], [address] [place], [place] [address]   Blood culture (routine single)     Status: None   Collection Time: 08/28/20 10:31 PM   Specimen: BLOOD  Result Value Ref Range Status   Specimen Description BLOOD RIGHT ANTECUBITAL  Final   Special Requests   Final    BOTTLES DRAWN AEROBIC AND ANAEROBIC Blood Culture adequate volume   Culture   Final    NO GROWTH 5 DAYS Performed at [facility], [address], [place], [place] [address]    Report Status 09/02/2020 FINAL  Final  Urine culture     Status: Abnormal   Collection Time: 08/28/20 10:31 PM   Specimen: In/Out Cath Urine  Result Value Ref Range Status   Specimen Description   Final    IN/OUT CATH URINE Performed at [facility], [address], [place], [place] [address]    Special Requests   Final    NONE Performed at [facility], [address] [place], [place] [address]    Culture MULTIPLE SPECIES PRESENT, SUGGEST RECOLLECTION (A)  Final   Report Status 08/30/2020 FINAL  Final     Time coordinating discharge: Over 30 minutes  SIGNED:   Charise Killian, MD  [facility] 09/02/2020, 1:46 PM Pager   If 7PM-7AM, please contact night-coverage

## 2020-09-02 NOTE — Progress Notes (Addendum)
Patient being discharged home with self care, patient provided discharge instructions and education, discharge paperwork given to patient.  Patient verbalizes understanding of discharge instructions.  PIV removed, Three Sacral dressings changed. Pt off unit via personal WC accompanied by this RN. 1:04 PM  09/02/20 Antony Salmon Jamond Neels

## 2020-11-21 DIAGNOSIS — G839 Paralytic syndrome, unspecified: Secondary | ICD-10-CM | POA: Diagnosis not present

## 2020-11-21 DIAGNOSIS — Z436 Encounter for attention to other artificial openings of urinary tract: Secondary | ICD-10-CM | POA: Diagnosis not present

## 2020-11-21 DIAGNOSIS — Z435 Encounter for attention to cystostomy: Secondary | ICD-10-CM | POA: Diagnosis not present

## 2020-11-21 DIAGNOSIS — N360 Urethral fistula: Secondary | ICD-10-CM | POA: Diagnosis not present

## 2020-11-21 DIAGNOSIS — R339 Retention of urine, unspecified: Secondary | ICD-10-CM | POA: Diagnosis not present

## 2020-12-13 DIAGNOSIS — T83098A Other mechanical complication of other indwelling urethral catheter, initial encounter: Secondary | ICD-10-CM | POA: Diagnosis not present

## 2020-12-13 DIAGNOSIS — G8220 Paraplegia, unspecified: Secondary | ICD-10-CM | POA: Diagnosis not present

## 2020-12-13 DIAGNOSIS — Z87891 Personal history of nicotine dependence: Secondary | ICD-10-CM | POA: Diagnosis not present

## 2020-12-26 ENCOUNTER — Encounter: Payer: Self-pay | Admitting: Emergency Medicine

## 2020-12-26 ENCOUNTER — Other Ambulatory Visit: Payer: Self-pay

## 2020-12-26 ENCOUNTER — Emergency Department
Admission: EM | Admit: 2020-12-26 | Discharge: 2020-12-26 | Disposition: A | Discharge status: Home / Self Care | Payer: Medicare HMO | Attending: Student in an Organized Health Care Education/Training Program | Admitting: Student in an Organized Health Care Education/Training Program

## 2020-12-26 DIAGNOSIS — T83091A Other mechanical complication of indwelling urethral catheter, initial encounter: Secondary | ICD-10-CM | POA: Diagnosis not present

## 2020-12-26 DIAGNOSIS — R339 Retention of urine, unspecified: Secondary | ICD-10-CM | POA: Diagnosis present

## 2020-12-26 DIAGNOSIS — Z87891 Personal history of nicotine dependence: Secondary | ICD-10-CM | POA: Diagnosis not present

## 2020-12-26 DIAGNOSIS — T83010A Breakdown (mechanical) of cystostomy catheter, initial encounter: Secondary | ICD-10-CM

## 2020-12-26 DIAGNOSIS — T83198A Other mechanical complication of other urinary devices and implants, initial encounter: Secondary | ICD-10-CM | POA: Diagnosis not present

## 2020-12-26 LAB — CBC WITH DIFFERENTIAL/PLATELET
Abs Immature Granulocytes: 0.02 10*3/uL (ref 0.00–0.07)
Basophils Absolute: 0.1 10*3/uL (ref 0.0–0.1)
Basophils Relative: 1 %
Eosinophils Absolute: 0.1 10*3/uL (ref 0.0–0.5)
Eosinophils Relative: 1 %
HCT: 36.4 % — ABNORMAL LOW (ref 39.0–52.0)
Hemoglobin: 11.1 g/dL — ABNORMAL LOW (ref 13.0–17.0)
Immature Granulocytes: 0 %
Lymphocytes Relative: 25 %
Lymphs Abs: 2.1 10*3/uL (ref 0.7–4.0)
MCH: 21.5 pg — ABNORMAL LOW (ref 26.0–34.0)
MCHC: 30.5 g/dL (ref 30.0–36.0)
MCV: 70.4 fL — ABNORMAL LOW (ref 80.0–100.0)
Monocytes Absolute: 0.5 10*3/uL (ref 0.1–1.0)
Monocytes Relative: 6 %
Neutro Abs: 5.6 10*3/uL (ref 1.7–7.7)
Neutrophils Relative %: 67 %
Platelets: 362 10*3/uL (ref 150–400)
RBC: 5.17 MIL/uL (ref 4.22–5.81)
RDW: 18.4 % — ABNORMAL HIGH (ref 11.5–15.5)
WBC: 8.5 10*3/uL (ref 4.0–10.5)
nRBC: 0 % (ref 0.0–0.2)

## 2020-12-26 LAB — COMPREHENSIVE METABOLIC PANEL
ALT: 10 U/L (ref 0–44)
AST: 14 U/L — ABNORMAL LOW (ref 15–41)
Albumin: 3.4 g/dL — ABNORMAL LOW (ref 3.5–5.0)
Alkaline Phosphatase: 76 U/L (ref 38–126)
Anion gap: 7 (ref 5–15)
BUN: 14 mg/dL (ref 6–20)
CO2: 25 mmol/L (ref 22–32)
Calcium: 8.5 mg/dL — ABNORMAL LOW (ref 8.9–10.3)
Chloride: 105 mmol/L (ref 98–111)
Creatinine, Ser: 0.58 mg/dL — ABNORMAL LOW (ref 0.61–1.24)
GFR, Estimated: >60 mL/min (ref >60)
Glucose, Bld: 105 mg/dL — ABNORMAL HIGH (ref 70–99)
Potassium: 3.5 mmol/L (ref 3.5–5.1)
Sodium: 137 mmol/L (ref 135–145)
Total Bilirubin: 0.3 mg/dL (ref 0.3–1.2)
Total Protein: 7.3 g/dL (ref 6.5–8.1)

## 2020-12-26 LAB — URINALYSIS, ROUTINE W REFLEX MICROSCOPIC
Bilirubin Urine: NEGATIVE
Glucose, UA: NEGATIVE mg/dL
Ketones, ur: 5 mg/dL — AB
Nitrite: NEGATIVE
Protein, ur: 100 mg/dL — AB
Specific Gravity, Urine: 1.025 (ref 1.005–1.030)
Squamous Epithelial / HPF: NONE SEEN (ref 0–5)
WBC, UA: >50 WBC/hpf — ABNORMAL HIGH (ref 0–5)
pH: 6 (ref 5.0–8.0)

## 2020-12-26 LAB — LACTIC ACID, PLASMA: Lactic Acid, Venous: 1.4 mmol/L (ref 0.5–1.9)

## 2020-12-26 MED ORDER — OXYCODONE-ACETAMINOPHEN 5-325 MG PO TABS
1.0000 | ORAL_TABLET | Freq: Once | ORAL | Status: AC
  Administered 2020-12-26: 1 via ORAL
  Filled 2020-12-26: qty 1

## 2020-12-26 MED ORDER — CEPHALEXIN 500 MG PO CAPS
500.0000 mg | ORAL_CAPSULE | Freq: Once | ORAL | Status: AC
  Administered 2020-12-26: 500 mg via ORAL
  Filled 2020-12-26: qty 1

## 2020-12-26 MED ORDER — CEPHALEXIN 500 MG PO CAPS: 500.0000 mg | ORAL_CAPSULE | Freq: Three times a day (TID) | ORAL | 0 refills | Status: AC

## 2020-12-26 NOTE — ED Provider Notes (Addendum)
[facility] Emergency Department Provider Note    Event Date/Time   First MD Initiated Contact with Patient 12/26/20 [phone]     (approximate)  I have reviewed the triage vital signs and the nursing notes.   HISTORY  Chief Complaint Urinary Retention and Abdominal Pain    HPI Jonathon York is a 45 y.o. male with paraplegia with chronic indwelling suprapubic Foley presents to the ER due to complications after voicing that became clogged has not been draining since last night.  States he was having overflow incontinence and placed urethral catheter which did help some.  He tried flushing it out at home without any improvement.  Has not been on any antibiotics.  Does have the catheter exchanged 12 days ago works for 2 weeks with no issues.  Denies any fevers or chills.  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   Family History  Problem Relation Age of Onset   Lymphoma Sister    Past Surgical History:  Procedure Laterality Date   AMPUTATION Right 03/19/2018   Procedure: 5th Metatarsal Resection;  Surgeon: Recardo Evangelist, DPM;  Location: [facility] ORS;  Service: Podiatry;  Laterality: Right;   AMPUTATION TOE Right 04/29/2015   Procedure: AMPUTATION TOE;  Surgeon: Linus Galas, MD;  Location: [facility] ORS;  Service: Podiatry;  Laterality: Right;   IRRIGATION AND DEBRIDEMENT BUTTOCKS     TOE AMPUTATION Right    Patient Active Problem List   Diagnosis Date Noted   Nausea and vomiting 08/29/2020   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   Generalized abdominal pain    UTI (urinary tract infection) 12/02/2019   UTI (urinary tract infection) due to urinary indwelling catheter (HCC) 12/01/2019   Infected decubitus ulcer 10/27/2019   Chronic indwelling Foley catheter 10/27/2019   Bilateral leg edema 10/27/2019   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    Constipation    Iron deficiency anemia    Sacral decubitus ulcer, stage IV (HCC) 07/13/2019   Intractable pain 11/26/2018   Anxiety 04/22/2018   Acute osteomyelitis of right foot (HCC) 03/18/2018   Pressure injury of skin 03/18/2018   Stage IV pressure ulcer of right buttock (HCC)    Protein-calorie malnutrition, severe 11/20/2016   Herpes simplex infection of penis    Decubitus ulcer 11/19/2016   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 Authorizing Provider  cephALEXin (KEFLEX) 500 MG capsule Take 1 capsule (500 mg total) by mouth 3 (three) times daily for 5 days. 12/26/20 12/31/20 Yes Willy Eddy, MD  ferrous gluconate (FERGON) 324 MG tablet Take 1 tablet (324 mg total) by mouth 2 (two) times daily with a meal. 09/02/20 10/02/20  Charise Killian, MD    Allergies Orange fruit [citrus] and Sulfa antibiotics    Social History Social History   Tobacco Use   Smoking status: Former    Packs/day: 0.50  Types: Cigarettes   Smokeless tobacco: Never  Vaping Use   Vaping Use: Never used  Substance Use Topics   Alcohol use: No   Drug use: Yes    Types: Marijuana    Comment: 2 days ago    Review of Systems Patient denies headaches, rhinorrhea, blurry vision, numbness, shortness of breath, chest pain, edema, cough, abdominal pain, nausea, vomiting, diarrhea, dysuria, fevers, rashes or hallucinations unless otherwise stated above in HPI. ____________________________________________   PHYSICAL  EXAM:  VITAL SIGNS: Vitals:   12/26/20 0310 12/26/20 0645  BP: 123/90 118/76  Pulse: (!) 108 98  Resp: 18 20  Temp: 98.2 F (36.8 C)   SpO2: 98% 99%    Constitutional: Alert and oriented.  Eyes: Conjunctivae are normal.  Head: Atraumatic. Nose: No congestion/rhinnorhea. Mouth/Throat: Mucous membranes are moist.   Neck: No stridor. Painless ROM.  Cardiovascular: Normal rate, regular rhythm. Grossly normal heart sounds.  Good peripheral circulation. Respiratory: Normal respiratory effort.  No retractions. Lungs CTAB. Gastrointestinal: Soft and nontender. No distention. No abdominal bruits. No CVA tenderness. Genitourinary: Suprapubic catheter in place with no purulent drainage no surrounding erythema or cellulitis. Musculoskeletal:  No joint effusions. Neurologic:  Normal speech and language. No gross focal neurologic deficits are appreciated. No facial droop Skin:  Skin is warm, dry and intact. No rash noted. Psychiatric: Mood and affect are normal. Speech and behavior are normal.  ____________________________________________   LABS (all labs ordered are listed, but only abnormal results are displayed)  Results for orders placed or performed during the hospital encounter of 12/26/20 (from the past 24 hour(s))  CBC with Differential     Status: Abnormal   Collection Time: 12/26/20  3:34 AM  Result Value Ref Range   WBC 8.5 4.0 - 10.5 K/uL   RBC 5.17 4.22 - 5.81 MIL/uL   Hemoglobin 11.1 (L) 13.0 - 17.0 g/dL   HCT 17.6 (L) 16.0 - 73.7 %   MCV 70.4 (L) 80.0 - 100.0 fL   MCH 21.5 (L) 26.0 - 34.0 pg   MCHC 30.5 30.0 - 36.0 g/dL   RDW 10.6 (H) 26.9 - 48.5 %   Platelets 362 150 - 400 K/uL   nRBC 0.0 0.0 - 0.2 %   Neutrophils Relative % 67 %   Neutro Abs 5.6 1.7 - 7.7 K/uL   Lymphocytes Relative 25 %   Lymphs Abs 2.1 0.7 - 4.0 K/uL   Monocytes Relative 6 %   Monocytes Absolute 0.5 0.1 - 1.0 K/uL   Eosinophils Relative 1 %   Eosinophils Absolute 0.1 0.0 - 0.5 K/uL    Basophils Relative 1 %   Basophils Absolute 0.1 0.0 - 0.1 K/uL   Immature Granulocytes 0 %   Abs Immature Granulocytes 0.02 0.00 - 0.07 K/uL  Comprehensive metabolic panel     Status: Abnormal   Collection Time: 12/26/20  3:34 AM  Result Value Ref Range   Sodium 137 135 - 145 mmol/L   Potassium 3.5 3.5 - 5.1 mmol/L   Chloride 105 98 - 111 mmol/L   CO2 25 22 - 32 mmol/L   Glucose, Bld 105 (H) 70 - 99 mg/dL   BUN 14 6 - 20 mg/dL   Creatinine, Ser 4.62 (L) 0.61 - 1.24 mg/dL   Calcium 8.5 (L) 8.9 - 10.3 mg/dL   Total Protein 7.3 6.5 - 8.1 g/dL   Albumin 3.4 (L) 3.5 - 5.0 g/dL   AST 14 (L) 15 - 41 U/L   ALT 10 0 -  44 U/L   Alkaline Phosphatase 76 38 - 126 U/L   Total Bilirubin 0.3 0.3 - 1.2 mg/dL   GFR, Estimated >03 >50 mL/min   Anion gap 7 5 - 15  Lactic acid, plasma     Status: None   Collection Time: 12/26/20  3:34 AM  Result Value Ref Range   Lactic Acid, Venous 1.4 0.5 - 1.9 mmol/L  Urinalysis, Routine w reflex microscopic Urine, Catheterized     Status: Abnormal   Collection Time: 12/26/20  6:43 AM  Result Value Ref Range   Color, Urine YELLOW (A) YELLOW   APPearance CLOUDY (A) CLEAR   Specific Gravity, Urine 1.025 1.005 - 1.030   pH 6.0 5.0 - 8.0   Glucose, UA NEGATIVE NEGATIVE mg/dL   Hgb urine dipstick SMALL (A) NEGATIVE   Bilirubin Urine NEGATIVE NEGATIVE   Ketones, ur 5 (A) NEGATIVE mg/dL   Protein, ur 093 (A) NEGATIVE mg/dL   Nitrite NEGATIVE NEGATIVE   Leukocytes,Ua LARGE (A) NEGATIVE   RBC / HPF 21-50 0 - 5 RBC/hpf   WBC, UA >50 (H) 0 - 5 WBC/hpf   Bacteria, UA FEW (A) NONE SEEN   Squamous Epithelial / LPF NONE SEEN 0 - 5   Mucus PRESENT    ____________________________________________ ____________________________________________  RADIOLOGY  EMERGENCY DEPARTMENT ULTRASOUND  Study: Limited Ultrasound of Bladder  INDICATIONS: to assess for urinary retention and/or bladder volume prior to urinary catheter Multiple views of the bladder were obtained in  real-time in the transverse and longitudinal planes with a multi-frequency probe.  PERFORMED BY: Myself IMAGES ARCHIVED?: No LIMITATIONS:  none INTERPRETATION: Minimal Volume      ____________________________________________   PROCEDURES  Procedure(s) performed:  SUPRAPUBIC TUBE PLACEMENT  Date/Time: 02/12/2021 2:25 PM Performed by: Willy Eddy, MD Authorized by: Willy Eddy, MD   Consent:    Consent obtained:  Verbal   Consent given by:  Patient   Risks discussed:  Bleeding, infection and pain   Alternatives discussed:  Delayed treatment, no treatment, alternative treatment, observation and referral Sedation:    Sedation type:  None Procedure details:    Complexity:  Simple   Catheter type:  Foley   Number of attempts:  1   Urine characteristics:  Clear Post-procedure details:    Procedure completion:  Tolerated    Critical Care performed: no ____________________________________________   INITIAL IMPRESSION / ASSESSMENT AND PLAN / ED COURSE  Pertinent labs & imaging results that were available during my care of the patient were reviewed by me and considered in my medical decision making (see chart for details).   DDX: Clogged catheter, displaced catheter, UTI   Jonathon York is a 45 y.o. who presents to the ED with presentation as described above with evidence of clogged suprapubic catheter.  Exam otherwise reassuring.  Supra pubic catheter was exchanged had quite a bit of material clogging the suprapubic catheter explaining why it was unable to be flushed.  Will place on antibiotic as well as discussed close outpatient follow-up.    The patient was evaluated in Emergency Department today for the symptoms described in the history of present illness. He/she was evaluated in the context of the global COVID-19 pandemic, which necessitated consideration that the patient might be at risk for infection with the SARS-CoV-2 virus that causes COVID-19.  Institutional protocols and algorithms that pertain to the evaluation of patients at risk for COVID-19 are in a state of rapid change based on information released by regulatory bodies including the CDC  and federal and state organizations. These policies and algorithms were followed during the patient's care in the ED.  As part of my medical decision making, I reviewed the following data within the electronic MEDICAL RECORD NUMBER Nursing notes reviewed and incorporated, Labs reviewed, notes from prior ED visits and [place] Controlled Substance Database   ____________________________________________   FINAL CLINICAL IMPRESSION(S) / ED DIAGNOSES  Final diagnoses:  Suprapubic catheter dysfunction, initial encounter (HCC)      NEW MEDICATIONS STARTED DURING THIS VISIT:  New Prescriptions   CEPHALEXIN (KEFLEX) 500 MG CAPSULE    Take 1 capsule (500 mg total) by mouth 3 (three) times daily for 5 days.     Note:  This document was prepared using Dragon voice recognition software and may include unintentional dictation errors.    Willy Eddyobinson, Liticia Gasior, MD 12/26/20 [id number]    Willy Eddyobinson, Ousman Dise, MD 02/12/21 1426

## 2020-12-26 NOTE — ED Triage Notes (Addendum)
Pt to triage via w/c with no distress noted; Pt reports suprapubic cath in place (changed approx 2wks ago), diff draining since last night; pt was at [facility] but left due to long wait; st inserted foley with some results; reports some odor to urine and lower abd pain; pt is concerned he may have a UTI

## 2020-12-26 NOTE — ED Notes (Signed)
Pt with 59fr supra pubic cath without output since yesterday.

## 2020-12-26 NOTE — ED Notes (Addendum)
Leg bag in place with cloudy odorous urine noted from urethral cath pt placed at home; bag removed and new bag connected asceptically to urethral foley to obtain clean urine specimen

## 2020-12-26 NOTE — Discharge Instructions (Addendum)
Be sure to drink plenty of fluids.  Pick up antibiotics at pharmacy.  Follow up with PCP and urology.

## 2021-01-21 ENCOUNTER — Other Ambulatory Visit: Payer: Self-pay

## 2021-01-21 ENCOUNTER — Emergency Department
Admission: EM | Admit: 2021-01-21 | Discharge: 2021-01-21 | Disposition: A | Discharge status: Home / Self Care | Payer: Medicare HMO | Attending: Emergency Medicine | Admitting: Emergency Medicine

## 2021-01-21 DIAGNOSIS — T83198A Other mechanical complication of other urinary devices and implants, initial encounter: Secondary | ICD-10-CM | POA: Diagnosis not present

## 2021-01-21 DIAGNOSIS — G8220 Paraplegia, unspecified: Secondary | ICD-10-CM

## 2021-01-21 DIAGNOSIS — Y833 Surgical operation with formation of external stoma as the cause of abnormal reaction of the patient, or of later complication, without mention of misadventure at the time of the procedure: Secondary | ICD-10-CM | POA: Insufficient documentation

## 2021-01-21 DIAGNOSIS — Z87891 Personal history of nicotine dependence: Secondary | ICD-10-CM | POA: Insufficient documentation

## 2021-01-21 DIAGNOSIS — T83090A Other mechanical complication of cystostomy catheter, initial encounter: Secondary | ICD-10-CM | POA: Diagnosis not present

## 2021-01-21 DIAGNOSIS — N390 Urinary tract infection, site not specified: Secondary | ICD-10-CM | POA: Diagnosis not present

## 2021-01-21 DIAGNOSIS — T83010A Breakdown (mechanical) of cystostomy catheter, initial encounter: Secondary | ICD-10-CM

## 2021-01-21 DIAGNOSIS — N319 Neuromuscular dysfunction of bladder, unspecified: Secondary | ICD-10-CM | POA: Diagnosis not present

## 2021-01-21 MED ORDER — CIPROFLOXACIN HCL 500 MG PO TABS: 500.0000 mg | ORAL_TABLET | Freq: Two times a day (BID) | ORAL | 0 refills | Status: AC

## 2021-01-21 NOTE — ED Provider Notes (Signed)
[facility] Emergency Department Provider Note ____________________________________________  Time seen: 0830  I have reviewed the triage vital signs and the nursing notes.  HISTORY  Chief Complaint  Urinary Catheter Problem   HPI Jonathon York is a 45 y.o. male presents to the ER today with complaint of dislodgment of his suprapubic catheter.  He reports he was trying to flush it at approximately 730 this morning when the balloon popped in the suprapubic catheter came out.  He did put in a Foley catheter in his urethra.  He has not noticed any drainage from the area.  He reports he has had the suprapubic catheter for approximately 4 to 5 months for neurogenic bladder secondary to paraparesis.  He follows with Dr. Nino Parsley at [facility].  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    Constipation    Iron deficiency anemia    Sacral decubitus ulcer, stage IV (HCC) 07/13/2019   Intractable pain 11/26/2018   Anxiety 04/22/2018   Acute osteomyelitis of right foot (HCC) 03/18/2018   Pressure injury of skin 03/18/2018   Stage IV pressure ulcer of right buttock (HCC)    Protein-calorie malnutrition, severe 11/20/2016   Herpes simplex infection of penis    Decubitus ulcer 11/19/2016   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   Decubitus ulcer of sacral region    Sepsis (HCC)  04/15/2016   Amputation of fourth toe, right, 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   Pressure ulcer stage III 04/30/2015   Sterile pyuria 04/30/2015   Paraplegia (HCC) 04/30/2015   Neurogenic bladder 04/30/2015   Toe osteomyelitis, right (HCC) 04/28/2015   Moderate malnutrition (HCC) 04/28/2015    Past Surgical History:  Procedure Laterality Date   AMPUTATION Right 03/19/2018   Procedure: 5th Metatarsal Resection;  Surgeon: Recardo Evangelist, DPM;  Location: [facility] ORS;  Service: Podiatry;  Laterality: Right;   AMPUTATION TOE Right 04/29/2015   Procedure: AMPUTATION TOE;  Surgeon: Linus Galas, MD;  Location: [facility] ORS;  Service: Podiatry;  Laterality: Right;   IRRIGATION AND DEBRIDEMENT BUTTOCKS     TOE AMPUTATION Right     Prior to Admission medications   Medication Sig Start Date End Date Taking? Authorizing Provider  ciprofloxacin (CIPRO) 500 MG tablet Take 1 tablet (500 mg total) by mouth 2 (two) times daily for 7 days. 01/21/21 01/28/21 Yes Lorre Munroe, NP  ferrous gluconate (FERGON) 324 MG tablet Take 1 tablet (324 mg total) by mouth 2 (two) times daily with a meal. 09/02/20 10/02/20  Charise Killian, MD    Allergies Orange fruit [citrus] and Sulfa antibiotics  Family History  Problem Relation Age of Onset   Lymphoma Sister     Social History Social History   Tobacco Use   Smoking status: Former    Packs/day: 0.50    Types: Cigarettes   Smokeless tobacco: Never  Vaping Use   Vaping  Use: Never used  Substance Use Topics   Alcohol use: No   Drug use: Yes    Types: Marijuana    Comment: 2 days ago    Review of Systems  Constitutional: Negative for fever, chills or body aches. Cardiovascular: Negative for chest pain or chest tightness. Respiratory: Negative for cough or  shortness of breath. Gastrointestinal: Negative for abdominal pain. Genitourinary: Positive for dislodgment of suprapubic catheter.  Negative for urgency, frequency or dysuria. Neurological: Positive  for paraplegia BLE.  Negative for numbness or tingling. ____________________________________________  PHYSICAL EXAM:  VITAL SIGNS: ED Triage Vitals  Enc Vitals Group     BP 01/21/21 0758 111/68     Pulse Rate 01/21/21 0758 97     Resp 01/21/21 0758 18     Temp 01/21/21 0758 97.9 F (36.6 C)     Temp Source 01/21/21 0758 Oral     SpO2 01/21/21 0758 98 %     Weight 01/21/21 0757 108 lb (49 kg)     Height 01/21/21 0757 5\' 10"  (1.778 m)     Head Circumference --      Peak Flow --      Pain Score 01/21/21 0811 2     Pain Loc --      Pain Edu? --      Excl. in GC? --     Constitutional: Alert and oriented. In no distress. Cardiovascular: Normal rate, regular rhythm.  Respiratory: Normal respiratory effort. No wheezes/rales/rhonchi. Gastrointestinal: Soft and nontender. No distention.  Suprapubic stoma noted to left of the midline lower abdomen Musculoskeletal: Wheelchair-bound. Neurologic:  Normal speech and language. No gross focal neurologic deficits are appreciated. Skin:  Skin is warm, dry and intact. No rash noted.   ____________________________________________  PROCEDURES  SUPRAPUBIC TUBE PLACEMENT  Date/Time: 01/21/2021 9:07 AM Performed by: 01/23/2021, NP Authorized by: Lorre Munroe, NP   Consent:    Consent obtained:  Verbal   Consent given by:  Patient   Risks, benefits, and alternatives were discussed: yes     Risks discussed:  Infection, pain and bleeding Universal protocol:    Procedure explained and questions answered to patient or proxy's satisfaction: yes     Immediately prior to procedure, a time out was called: yes     Patient identity confirmed:  Verbally with patient and arm band Sedation:    Sedation type:  None Anesthesia:    Anesthesia method:  None Procedure details:    Complexity:  Simple   Catheter type:  Foley   Catheter size:  16 Fr   Ultrasound guidance: no     Number of attempts:  1   Urine characteristics:  Mildly  cloudy Post-procedure details:    Procedure completion:  Tolerated well, no immediate complications  ____________________________________________  INITIAL IMPRESSION / ASSESSMENT AND PLAN / ED COURSE  Suprapubic Catheter Dislodged, Neurogenic Bladder secondary to Paraparesis:  Suprapubic Foley replaced by this provider Irrigated with 15 cc normal saline Return urine slightly cloudy Rx for Cipro 500 mg twice daily x7 days Encourage fluids We will have him follow-up with Dr. Lorre Munroe as an outpatient ____________________________________________  FINAL CLINICAL IMPRESSION(S) / ED DIAGNOSES  Final diagnoses:  Suprapubic catheter dysfunction, initial encounter [facility])  Neurogenic bladder  Paraparesis of both lower limbs (HCC)      [facility], NP 01/21/21 01/23/21    [id number], MD 01/21/21 1226

## 2021-01-21 NOTE — ED Triage Notes (Addendum)
Pt to ER via POV. Reports he was flushing his suprapubic catheter this morning when the balloon popped and the catheter came out at approx 0730am. Surprapubic has been in placed for 4-5 months prior to this. Pt reports some bloody drainage after catheter removal. No signs of infection. States it had been clogged but he hadn't irrigated it for 3-4 days.

## 2021-01-21 NOTE — Discharge Instructions (Addendum)
You were seen today for suprapubic catheter displacement.  This was replaced and irrigated.  I am placing you on oral antibiotics twice daily for the next 7 days for possible infection.  Please follow-up with urologist for further evaluation.

## 2021-01-21 NOTE — ED Notes (Signed)
Spoke to [place] about pt who says placing foley in hole to prevent closing and f/u with urology is appropriate. Taken to flex room.

## 2021-02-06 DIAGNOSIS — N319 Neuromuscular dysfunction of bladder, unspecified: Secondary | ICD-10-CM | POA: Diagnosis not present

## 2021-02-06 DIAGNOSIS — G8220 Paraplegia, unspecified: Secondary | ICD-10-CM | POA: Diagnosis not present

## 2021-02-06 DIAGNOSIS — M8660 Other chronic osteomyelitis, unspecified site: Secondary | ICD-10-CM | POA: Diagnosis not present

## 2021-02-06 DIAGNOSIS — R627 Adult failure to thrive: Secondary | ICD-10-CM | POA: Diagnosis not present

## 2021-02-06 DIAGNOSIS — L89324 Pressure ulcer of left buttock, stage 4: Secondary | ICD-10-CM | POA: Diagnosis not present

## 2021-02-09 ENCOUNTER — Other Ambulatory Visit: Payer: Self-pay

## 2021-02-09 DIAGNOSIS — L89159 Pressure ulcer of sacral region, unspecified stage: Secondary | ICD-10-CM | POA: Diagnosis not present

## 2021-02-09 DIAGNOSIS — Z87891 Personal history of nicotine dependence: Secondary | ICD-10-CM | POA: Diagnosis not present

## 2021-02-09 DIAGNOSIS — Z20822 Contact with and (suspected) exposure to covid-19: Secondary | ICD-10-CM | POA: Diagnosis not present

## 2021-02-09 DIAGNOSIS — R000 Tachycardia, unspecified: Secondary | ICD-10-CM | POA: Diagnosis not present

## 2021-02-09 DIAGNOSIS — L89314 Pressure ulcer of right buttock, stage 4: Secondary | ICD-10-CM | POA: Insufficient documentation

## 2021-02-09 DIAGNOSIS — T83511A Infection and inflammatory reaction due to indwelling urethral catheter, initial encounter: Secondary | ICD-10-CM | POA: Diagnosis not present

## 2021-02-09 DIAGNOSIS — R339 Retention of urine, unspecified: Secondary | ICD-10-CM | POA: Diagnosis not present

## 2021-02-09 NOTE — ED Triage Notes (Signed)
Pt states he is not able to flush his suprapubic cath tonight for last approx hour. Pt states is leaking urine from penis at this time. Pt with paraplegic. Pt states is a 64f suprpubic.

## 2021-02-10 ENCOUNTER — Observation Stay
Admission: EM | Admit: 2021-02-10 | Discharge: 2021-02-12 | Disposition: A | Discharge status: Home / Self Care | Payer: Medicare HMO | Attending: Family Medicine | Admitting: Family Medicine

## 2021-02-10 ENCOUNTER — Encounter: Payer: Self-pay | Admitting: Internal Medicine

## 2021-02-10 DIAGNOSIS — L89314 Pressure ulcer of right buttock, stage 4: Secondary | ICD-10-CM

## 2021-02-10 DIAGNOSIS — L89159 Pressure ulcer of sacral region, unspecified stage: Secondary | ICD-10-CM

## 2021-02-10 DIAGNOSIS — R339 Retention of urine, unspecified: Secondary | ICD-10-CM

## 2021-02-10 DIAGNOSIS — L988 Other specified disorders of the skin and subcutaneous tissue: Secondary | ICD-10-CM

## 2021-02-10 DIAGNOSIS — Z978 Presence of other specified devices: Secondary | ICD-10-CM | POA: Diagnosis not present

## 2021-02-10 DIAGNOSIS — Z9359 Other cystostomy status: Secondary | ICD-10-CM

## 2021-02-10 DIAGNOSIS — G8220 Paraplegia, unspecified: Secondary | ICD-10-CM

## 2021-02-10 DIAGNOSIS — N390 Urinary tract infection, site not specified: Secondary | ICD-10-CM | POA: Diagnosis present

## 2021-02-10 DIAGNOSIS — R000 Tachycardia, unspecified: Secondary | ICD-10-CM

## 2021-02-10 DIAGNOSIS — T83511A Infection and inflammatory reaction due to indwelling urethral catheter, initial encounter: Secondary | ICD-10-CM | POA: Diagnosis not present

## 2021-02-10 DIAGNOSIS — L98429 Non-pressure chronic ulcer of back with unspecified severity: Secondary | ICD-10-CM

## 2021-02-10 DIAGNOSIS — N319 Neuromuscular dysfunction of bladder, unspecified: Secondary | ICD-10-CM

## 2021-02-10 LAB — URINALYSIS, ROUTINE W REFLEX MICROSCOPIC
Bacteria, UA: NONE SEEN
Bilirubin Urine: NEGATIVE
Bilirubin Urine: NEGATIVE
Glucose, UA: NEGATIVE mg/dL
Glucose, UA: NEGATIVE mg/dL
Hgb urine dipstick: NEGATIVE
Ketones, ur: NEGATIVE mg/dL
Ketones, ur: 20 mg/dL — AB
Leukocytes,Ua: NEGATIVE
Nitrite: NEGATIVE
Nitrite: NEGATIVE
Protein, ur: NEGATIVE mg/dL
Protein, ur: 100 mg/dL — AB
RBC / HPF: >50 RBC/hpf — ABNORMAL HIGH (ref 0–5)
RBC / HPF: >50 RBC/hpf — ABNORMAL HIGH (ref 0–5)
Specific Gravity, Urine: 1.013 (ref 1.005–1.030)
Specific Gravity, Urine: 1.023 (ref 1.005–1.030)
Squamous Epithelial / HPF: NONE SEEN (ref 0–5)
Squamous Epithelial / HPF: NONE SEEN (ref 0–5)
pH: 6 (ref 5.0–8.0)
pH: 5 (ref 5.0–8.0)

## 2021-02-10 LAB — CBC WITH DIFFERENTIAL/PLATELET
Abs Immature Granulocytes: 0.05 10*3/uL (ref 0.00–0.07)
Basophils Absolute: 0.1 10*3/uL (ref 0.0–0.1)
Basophils Relative: 1 %
Eosinophils Absolute: 0 10*3/uL (ref 0.0–0.5)
Eosinophils Relative: 0 %
HCT: 33.9 % — ABNORMAL LOW (ref 39.0–52.0)
Hemoglobin: 10.5 g/dL — ABNORMAL LOW (ref 13.0–17.0)
Immature Granulocytes: 1 %
Lymphocytes Relative: 14 %
Lymphs Abs: 1.5 10*3/uL (ref 0.7–4.0)
MCH: 20.9 pg — ABNORMAL LOW (ref 26.0–34.0)
MCHC: 31 g/dL (ref 30.0–36.0)
MCV: 67.5 fL — ABNORMAL LOW (ref 80.0–100.0)
Monocytes Absolute: 0.9 10*3/uL (ref 0.1–1.0)
Monocytes Relative: 8 %
Neutro Abs: 8.2 10*3/uL — ABNORMAL HIGH (ref 1.7–7.7)
Neutrophils Relative %: 76 %
Platelets: 588 10*3/uL — ABNORMAL HIGH (ref 150–400)
RBC: 5.02 MIL/uL (ref 4.22–5.81)
RDW: 18.4 % — ABNORMAL HIGH (ref 11.5–15.5)
Smear Review: NORMAL
WBC: 10.7 10*3/uL — ABNORMAL HIGH (ref 4.0–10.5)
nRBC: 0 % (ref 0.0–0.2)

## 2021-02-10 LAB — COMPREHENSIVE METABOLIC PANEL
ALT: 10 U/L (ref 0–44)
AST: 11 U/L — ABNORMAL LOW (ref 15–41)
Albumin: 3.1 g/dL — ABNORMAL LOW (ref 3.5–5.0)
Alkaline Phosphatase: 70 U/L (ref 38–126)
Anion gap: 14 (ref 5–15)
BUN: 12 mg/dL (ref 6–20)
CO2: 24 mmol/L (ref 22–32)
Calcium: 9.1 mg/dL (ref 8.9–10.3)
Chloride: 101 mmol/L (ref 98–111)
Creatinine, Ser: 0.45 mg/dL — ABNORMAL LOW (ref 0.61–1.24)
GFR, Estimated: >60 mL/min (ref >60)
Glucose, Bld: 118 mg/dL — ABNORMAL HIGH (ref 70–99)
Potassium: 3.1 mmol/L — ABNORMAL LOW (ref 3.5–5.1)
Sodium: 139 mmol/L (ref 135–145)
Total Bilirubin: 0.5 mg/dL (ref 0.3–1.2)
Total Protein: 8.1 g/dL (ref 6.5–8.1)

## 2021-02-10 LAB — SEDIMENTATION RATE: Sed Rate: 82 mm/hr — ABNORMAL HIGH (ref 0–15)

## 2021-02-10 LAB — RESP PANEL BY RT-PCR (FLU A&B, COVID) ARPGX2
Influenza A by PCR: NEGATIVE
Influenza B by PCR: NEGATIVE
SARS Coronavirus 2 by RT PCR: NEGATIVE

## 2021-02-10 LAB — LACTIC ACID, PLASMA
Lactic Acid, Venous: 1.1 mmol/L (ref 0.5–1.9)
Lactic Acid, Venous: 1.4 mmol/L (ref 0.5–1.9)

## 2021-02-10 MED ORDER — DOCUSATE SODIUM 100 MG PO CAPS
100.0000 mg | ORAL_CAPSULE | Freq: Two times a day (BID) | ORAL | Status: DC
  Filled 2021-02-10 (×2): qty 1

## 2021-02-10 MED ORDER — SODIUM CHLORIDE 0.9% FLUSH
3.0000 mL | Freq: Two times a day (BID) | INTRAVENOUS | Status: DC
  Administered 2021-02-10 – 2021-02-12 (×2): 3 mL via INTRAVENOUS

## 2021-02-10 MED ORDER — SODIUM CHLORIDE 0.9 % IV BOLUS
1000.0000 mL | Freq: Once | INTRAVENOUS | Status: AC
  Administered 2021-02-10: 1000 mL via INTRAVENOUS

## 2021-02-10 MED ORDER — METRONIDAZOLE 500 MG/100ML IV SOLN
500.0000 mg | Freq: Three times a day (TID) | INTRAVENOUS | Status: DC
  Administered 2021-02-10 – 2021-02-11 (×3): 500 mg via INTRAVENOUS
  Filled 2021-02-10 (×4): qty 100

## 2021-02-10 MED ORDER — HYDRALAZINE HCL 20 MG/ML IJ SOLN: 5.0000 mg | INTRAMUSCULAR | Status: DC | PRN

## 2021-02-10 MED ORDER — ONDANSETRON HCL 4 MG/2ML IJ SOLN
4.0000 mg | Freq: Four times a day (QID) | INTRAMUSCULAR | Status: DC | PRN
  Administered 2021-02-11 – 2021-02-12 (×2): 4 mg via INTRAVENOUS
  Filled 2021-02-10 (×2): qty 2

## 2021-02-10 MED ORDER — METRONIDAZOLE 500 MG/100ML IV SOLN
500.0000 mg | Freq: Once | INTRAVENOUS | Status: AC
  Administered 2021-02-10: 500 mg via INTRAVENOUS
  Filled 2021-02-10: qty 100

## 2021-02-10 MED ORDER — POLYETHYLENE GLYCOL 3350 17 G PO PACK: 17.0000 g | PACK | Freq: Every day | ORAL | Status: DC | PRN

## 2021-02-10 MED ORDER — SODIUM CHLORIDE 0.9 % IV SOLN
2.0000 g | Freq: Three times a day (TID) | INTRAVENOUS | Status: DC
  Administered 2021-02-10 – 2021-02-12 (×6): 2 g via INTRAVENOUS
  Filled 2021-02-10 (×7): qty 2

## 2021-02-10 MED ORDER — ONDANSETRON HCL 4 MG PO TABS: 4.0000 mg | ORAL_TABLET | Freq: Four times a day (QID) | ORAL | Status: DC | PRN

## 2021-02-10 MED ORDER — VANCOMYCIN HCL IN DEXTROSE 1-5 GM/200ML-% IV SOLN
1000.0000 mg | Freq: Once | INTRAVENOUS | Status: AC
  Administered 2021-02-10: 1000 mg via INTRAVENOUS
  Filled 2021-02-10: qty 200

## 2021-02-10 MED ORDER — BISACODYL 5 MG PO TBEC: 5.0000 mg | DELAYED_RELEASE_TABLET | Freq: Every day | ORAL | Status: DC | PRN

## 2021-02-10 MED ORDER — VANCOMYCIN HCL 1250 MG/250ML IV SOLN
1250.0000 mg | INTRAVENOUS | Status: DC
  Administered 2021-02-11: 16:00:00 1250 mg via INTRAVENOUS
  Filled 2021-02-10 (×2): qty 250

## 2021-02-10 MED ORDER — LACTATED RINGERS IV SOLN: INTRAVENOUS | Status: DC

## 2021-02-10 MED ORDER — POTASSIUM CHLORIDE CRYS ER 20 MEQ PO TBCR
40.0000 meq | EXTENDED_RELEASE_TABLET | Freq: Once | ORAL | Status: AC
  Administered 2021-02-10: 40 meq via ORAL
  Filled 2021-02-10: qty 2

## 2021-02-10 MED ORDER — MORPHINE SULFATE (PF) 2 MG/ML IV SOLN
2.0000 mg | INTRAVENOUS | Status: DC | PRN
Start: 2021-02-10 — End: 2021-02-11

## 2021-02-10 MED ORDER — ALBUTEROL SULFATE (2.5 MG/3ML) 0.083% IN NEBU: 2.5000 mg | INHALATION_SOLUTION | RESPIRATORY_TRACT | Status: DC | PRN

## 2021-02-10 MED ORDER — ACETAMINOPHEN 650 MG RE SUPP: 650.0000 mg | Freq: Four times a day (QID) | RECTAL | Status: DC | PRN

## 2021-02-10 MED ORDER — SODIUM CHLORIDE 0.9 % IV SOLN
2.0000 g | Freq: Once | INTRAVENOUS | Status: AC
  Administered 2021-02-10: 2 g via INTRAVENOUS
  Filled 2021-02-10: qty 2

## 2021-02-10 MED ORDER — HYDROCODONE-ACETAMINOPHEN 5-325 MG PO TABS
1.0000 | ORAL_TABLET | ORAL | Status: DC | PRN
  Administered 2021-02-10 – 2021-02-12 (×3): 2 via ORAL
  Filled 2021-02-10 (×3): qty 2

## 2021-02-10 MED ORDER — ACETAMINOPHEN 325 MG PO TABS: 650.0000 mg | ORAL_TABLET | Freq: Four times a day (QID) | ORAL | Status: DC | PRN

## 2021-02-10 MED ORDER — ENOXAPARIN SODIUM 40 MG/0.4ML IJ SOSY
40.0000 mg | PREFILLED_SYRINGE | Freq: Every day | INTRAMUSCULAR | Status: DC
  Filled 2021-02-10: qty 0.4

## 2021-02-10 NOTE — Consult Note (Signed)
[facility] SURGICAL CONSULTATION NOTE (initial) - cpt: 16109)   HISTORY OF PRESENT ILLNESS (HPI):  45 y.o. male presented to [facility] ED today for evaluation of suprapubic obstruction. Patient reports urine drains into his wounds when his suprapubic is not functioning. Overall disappointed at the persistence of his wounds and failure to see them come/progress toward healing.  Surgery is consulted by attending physician Dr. Ophelia Charter in this context for evaluation and consideration of additional debridement for this gentleman's chronic bilateral ischial and sacral decubiti.  He reports he spends all of his time laying flat in bed, not up in a chair.  He only utilizes his wheelchair when he is in transport.  When discussed about his nutrition he admitted yes he is knows his nutrition is poor, but failed to substantiate any additional reason why or how to improve this.  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 Authorizing Provider  HYDROcodone-acetaminophen (NORCO/VICODIN) 5-325 MG tablet Take 1 tablet by mouth every 4 (four) hours as needed. 02/06/21  Yes [provider]  ferrous gluconate (FERGON) 324 MG tablet Take 1 tablet (324 mg total) by mouth 2 (two)  times daily with a meal. 09/02/20 10/02/20  Charise Killian, MD  Sodium Hypochlorite (DAKINS, FULL STRENGTH,) 0.5 % SOLN Apply 1 application topically daily. Patient not taking: Reported on 02/10/2021 02/06/21   [provider]     ALLERGIES:  Allergies  Allergen Reactions   Orange Fruit [Citrus] Other (See Comments)    Blisters only from ORANGES!!! Patient is not allergic from all citrus   Sulfa Antibiotics     Throwing up blood      SOCIAL HISTORY:  Social History   Socioeconomic History   Marital status: Single    Spouse name: Not on file   Number of children: Not on file   Years of education: Not on file   Highest education level: Not on file  Occupational History   Occupation: disabled  Tobacco Use   Smoking status: Former    Packs/day: 0.50    Types: Cigarettes    Quit date: 2018    Years since quitting: 4.7   Smokeless tobacco: Never  Vaping Use   Vaping Use: Never used  Substance and Sexual Activity   Alcohol use: No   Drug use: Yes    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   Sexual activity: Never  Other Topics Concern   Not on file  Social History Narrative   Not on file   Social Determinants of Health   Financial Resource Strain: Not on file  Food Insecurity: Not on file  Transportation Needs: Not on file  Physical Activity: Not on  file  Stress: Not on file  Social Connections: Not on file  Intimate Partner Violence: Not on file     FAMILY HISTORY:  Family History  Problem Relation Age of Onset   Lymphoma Sister       REVIEW OF SYSTEMS:  Constitutional: Positive chills Eyes: No visual changes. ENT: No sore throat. Cardiovascular: Denies chest pain. Respiratory: Denies shortness of breath. Gastrointestinal: No abdominal pain.  No nausea, no vomiting.  No diarrhea.  No constipation. Genitourinary: Foley catheter not working Musculoskeletal: Paraplegia with chronic decubiti. Skin: Negative for rash. Neurological:  Negative for headaches.  VITAL SIGNS:  Temp:  [97.8 F (36.6 C)-98 F (36.7 C)] 98 F (36.7 C) (10/15 1000) Pulse Rate:  [89-127] 89 (10/15 1500) Resp:  [16-20] 16 (10/15 1500) BP: (99-123)/(66-88) 99/69 (10/15 1500) SpO2:  [96 %-100 %] 100 % (10/15 1500) Weight:  [50 kg] 50 kg (10/14 2316)     Height: 5\' 10"  (177.8 cm) Weight: 50 kg BMI (Calculated): 15.82   INTAKE/OUTPUT:  No intake/output data recorded.  PHYSICAL EXAM:  Physical Exam Blood pressure 99/69, pulse 89, temperature 98 F (36.7 C), resp. rate 16, height 5\' 10"  (1.778 m), weight 50 kg, SpO2 100 %. Last Weight  Most recent update: 02/09/2021 11:16 PM    Weight  50 kg (110 lb 3.7 oz)             CONSTITUTIONAL: Thin paraplegic male, likely inadequately nourished, appropriately responsive and aware without distress.   EYES: Sclera non-icteric.   EARS, NOSE, MOUTH AND THROAT: Hearing is intact to voice.  NECK: Trachea is midline, and there is no jugular venous distension.  CARDIOVASCULAR: Heart is regular in rate and rhythm. GI: The abdomen is  soft, nontender, and nondistended.  GU:    MUSCULOSKELETAL: Consistent with paraplegia  SKIN: See photo above for decubiti. PSYCH:  Alert and oriented to person, place and time. Affect is appropriate for situation.  Data Reviewed I have personally reviewed what is currently available of the patient's imaging, recent labs and medical records.    Labs:  CBC Latest Ref Rng & Units 02/10/2021 12/26/2020 09/02/2020  WBC 4.0 - 10.5 K/uL 10.7(H) 8.5 5.9  Hemoglobin 13.0 - 17.0 g/dL 10.5(L) 11.1(L) 10.8(L)  Hematocrit 39.0 - 52.0 % 33.9(L) 36.4(L) 35.5(L)  Platelets 150 - 400 K/uL 588(H) 362 329   CMP Latest Ref Rng & Units 02/10/2021 12/26/2020 09/02/2020  Glucose 70 - 99 mg/dL 12/28/2020) 11/02/2020) 89  BUN 6 - 20 mg/dL 12 14 12   Creatinine 0.61 - 1.24 mg/dL 158(X) 094(M) )  Sodium 135 - 145 mmol/L 139 137 137  Potassium 3.5 - 5.1 mmol/L 3.1(L) 3.5 3.6  Chloride 98 - 111  mmol/L 101 105 105  CO2 22 - 32 mmol/L 24 25 25   Calcium 8.9 - 10.3 mg/dL 9.1 7.68(G) 8.81(J)  Total Protein 6.5 - 8.1 g/dL 8.1 7.3 -  Total Bilirubin 0.3 - 1.2 mg/dL 0.5 0.3 -  Alkaline Phos 38 - 126 U/L 70 76 -  AST 15 - 41 U/L 11(L) 14(L) -  ALT 0 - 44 U/L 10 10 -     Imaging studies:   Last 24 hrs: No results found.   Assessment/Plan:  45 y.o. male with chronic bilateral ischial and sacral decubiti , complicated by pertinent comorbidities including .  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    Constipation    Iron deficiency anemia    Sacral decubitus ulcer, stage IV (HCC) 07/13/2019   Intractable pain 11/26/2018   Anxiety 04/22/2018   Acute osteomyelitis of right foot (HCC) 03/18/2018   Pressure injury of skin 03/18/2018   Stage IV pressure ulcer of right buttock (HCC)    Protein-calorie malnutrition, severe 11/20/2016   Herpes simplex infection of penis    Decubitus ulcer 11/19/2016   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   Decubitus ulcer of sacral region    Sepsis (HCC) 04/15/2016   Amputation of fourth toe, right, 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 There is palpable bone, this stage IV decub.  Although it may make the appearance of the wound appear improved, unnecessary tissue loss by overaggressive debridement is to be avoided.  -Would continue more aggressive wound care with irrigation/cleansing of the right ischial ulcer.  More aggressive bony debridement may be considered in association with another procedure that may require an OR.  -Strict offloading, nutritional support are helpful, I doubt this brief urological drainage has been significant at any setback.   All of the above findings and recommendations were discussed with the patient, and all of patient's questions were answered to their expressed satisfaction.  Thank you for the opportunity to participate in this patient's care.   -- Campbell Lerner, M.D., FACS 02/10/2021, 5:28 PM

## 2021-02-10 NOTE — ED Notes (Signed)
Pt with multiple stage 3/4 open areas, dressings changed.  Mepilex dressing applied, pt states uses at home.  Has been providing wound care himself.

## 2021-02-10 NOTE — Consult Note (Signed)
PHARMACY -  BRIEF ANTIBIOTIC NOTE   Pharmacy has received consult(s) for vancomycin and cefepime from an ED provider. Patient is also ordered metronidazole. The patient's profile has been reviewed for ht/wt/allergies/indication/available labs.    One time order(s) placed for  --Vancomycin 1 g IV --Cefepime 2 g IV   Further antibiotics/pharmacy consults should be ordered by admitting physician if indicated.                       Thank you, Tressie Ellis 02/10/2021  12:08 PM

## 2021-02-10 NOTE — Progress Notes (Signed)
Patient arrived to 1C room 27 from ED via stretcher. A/O x 4, room air,complaining of pain 8/10 in lower back. Suprapubic catheter in place. IV fluids infusing in R FA. No requests or concerns at this time. Will report to oncoming nurse at shift change

## 2021-02-10 NOTE — ED Provider Notes (Addendum)
[facility] Emergency Department Provider Note  ____________________________________________   Event Date/Time   First MD Initiated Contact with Patient 02/10/21 [phone]     (approximate)  I have reviewed the triage vital signs and the nursing notes.   HISTORY  Chief Complaint Urinary Retention    HPI Jonathon York is a 45 y.o. male with neurogenic bladder secondary to paraplegia who comes in with concerns for suprapubic catheter not flushing.  Patient stated that it happened approximately 10 PM last night.  He states that he is unable to flush it and nothing is draining.  He reports urine leaking out of his penis.  This been constant, nothing makes it better or worse.  States that he has had the catheter since May and it was last changed on 9/25.  Patient states he started to feel ill a few days ago and has been taking 2 doses of Cipro to see if that would help.  He states he just feels kind of shaky and like he might be getting septic.          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    Constipation    Iron deficiency anemia    Sacral decubitus ulcer, stage IV (HCC) 07/13/2019   Intractable pain 11/26/2018   Anxiety 04/22/2018   Acute osteomyelitis of right foot (HCC) 03/18/2018   Pressure injury of skin 03/18/2018   Stage IV pressure ulcer of right buttock (HCC)    Protein-calorie  malnutrition, severe 11/20/2016   Herpes simplex infection of penis    Decubitus ulcer 11/19/2016   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   Decubitus ulcer of sacral region    Sepsis (HCC) 04/15/2016   Amputation of fourth toe, right, 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   Pressure ulcer stage III 04/30/2015   Sterile pyuria 04/30/2015   Paraplegia (HCC) 04/30/2015   Neurogenic bladder 04/30/2015   Toe osteomyelitis, right (HCC) 04/28/2015   Moderate malnutrition (HCC) 04/28/2015    Past Surgical History:  Procedure Laterality Date   AMPUTATION Right 03/19/2018   Procedure: 5th Metatarsal Resection;  Surgeon: Recardo Evangelist, DPM;  Location: [facility] ORS;  Service: Podiatry;  Laterality: Right;   AMPUTATION TOE Right 04/29/2015   Procedure: AMPUTATION TOE;  Surgeon: Linus Galas, MD;  Location: [facility] ORS;  Service: Podiatry;  Laterality: Right;   IRRIGATION AND DEBRIDEMENT BUTTOCKS     TOE AMPUTATION Right     Prior to Admission medications   Medication Sig Start Date End Date Taking? Authorizing Provider  ferrous gluconate (FERGON) 324 MG tablet Take 1 tablet (324 mg total) by mouth 2 (two) times daily with a meal. 09/02/20 10/02/20  Charise Killian, MD    Allergies Orange fruit [citrus] and Sulfa antibiotics  Family History  Problem Relation Age of Onset   Lymphoma Sister     Social History Social History  Tobacco Use   Smoking status: Former    Packs/day: 0.50    Types: Cigarettes   Smokeless tobacco: Never  Vaping Use   Vaping Use: Never used  Substance Use Topics   Alcohol use: No   Drug use: Yes    Types: Marijuana    Comment: 2 days ago      Review of Systems Constitutional: Positive chills Eyes: No visual changes. ENT: No sore throat. Cardiovascular: Denies chest pain. Respiratory: Denies shortness of breath. Gastrointestinal: No abdominal pain.  No nausea, no vomiting.  No diarrhea.  No constipation. Genitourinary:  Foley catheter not working Musculoskeletal: Positive ulcers Skin: Negative for rash. Neurological: Negative for headaches, focal weakness or numbness. All other ROS negative ____________________________________________   PHYSICAL EXAM:  VITAL SIGNS: ED Triage Vitals  Enc Vitals Group     BP 02/09/21 2315 110/66     Pulse Rate 02/09/21 2315 100     Resp 02/09/21 2315 16     Temp 02/09/21 2315 97.8 F (36.6 C)     Temp Source 02/09/21 2315 Oral     SpO2 02/09/21 2315 96 %     Weight 02/09/21 2316 110 lb 3.7 oz (50 kg)     Height 02/09/21 2316 5\' 10"  (1.778 m)     Head Circumference --      Peak Flow --      Pain Score 02/09/21 2315 0     Pain Loc --      Pain Edu? --      Excl. in GC? --     Constitutional: Alert and oriented. Well appearing and in no acute distress. Eyes: Conjunctivae are normal. EOMI. Head: Atraumatic. Nose: No congestion/rhinnorhea. Mouth/Throat: Mucous membranes are moist.   Neck: No stridor. Trachea Midline. FROM Cardiovascular: Tachycardic, regular rhythm. Grossly normal heart sounds.  Good peripheral circulation. Respiratory: Normal respiratory effort.  No retractions. Lungs CTAB. Gastrointestinal: Suprapubic catheter in place without any erythema or redness around the outside.  No distention. No abdominal bruits.  Musculoskeletal: No lower extremity tenderness nor edema.  No joint effusions. Neurologic:  Normal speech and language.  No sensation or strength from the mid waist, down Skin:  Skin is warm, dry and intact.  Severe ulcers noted to his back with a little bit of purulent drainage Psychiatric: Mood and affect are normal. Speech and behavior are normal. GU: Deferred   ____________________________________________   LABS (all labs ordered are listed, but only abnormal results are displayed)  Labs Reviewed  CULTURE, BLOOD (ROUTINE X 2)  CULTURE, BLOOD (ROUTINE X 2)  URINE CULTURE  RESP PANEL BY RT-PCR (FLU A&B, COVID) ARPGX2  CBC WITH  DIFFERENTIAL/PLATELET  SEDIMENTATION RATE  COMPREHENSIVE METABOLIC PANEL  LACTIC ACID, PLASMA  LACTIC ACID, PLASMA  URINALYSIS, ROUTINE W REFLEX MICROSCOPIC   ____________________________________________   ED ECG REPORT I, 02/11/21, the attending physician, personally viewed and interpreted this ECG.  Sinus tach rate of 111, no ST elevation, no T wave inversions, normal intervals ____________________________________________    PROCEDURES  Procedure(s) performed (including Critical Care):  Procedures   ____________________________________________   INITIAL IMPRESSION / ASSESSMENT AND PLAN / ED COURSE  Concha Se was evaluated in Emergency Department on 02/10/2021 for the symptoms described in the history of present illness. He was evaluated in the context of the global COVID-19 pandemic, which necessitated consideration that the patient might be at risk for infection with the SARS-CoV-2 virus that causes COVID-19. Institutional protocols and algorithms that pertain to the  evaluation of patients at risk for COVID-19 are in a state of rapid change based on information released by regulatory bodies including the CDC and federal and state organizations. These policies and algorithms were followed during the patient's care in the ED.    Patient comes in with concern for misfunction with pubic catheter not working.  Is been attempted to be flushed and still not working.  However he has no urine in his bladder because he is continuously urinating out of his penis.  It was removed and noted to have some corrosion on the end of it probably preventing it from working.  I placed a new suprapubic catheter the same size. I inserted it one third of the way in, did not feel the catheter int he penis and then after blowing it up he had a little bit of blood trickling out of his penis.  Catheter was removed and pulled back and we blew up again.  I used an ultrasound to confirm that the Foley  bulb was in the bladder.  Given a little bit of trickling hematuria coming from the penis I discussed the case with urology Dr. Apolinar Junes who said that it was most likely just some irritation of the bladder or potentially urethra from the Foley balloon  (but less likely) at that there is nothing emergent to do about this .  That she would just recommend to confirm that is in place with flushing 60 cc of fluid.  When we attempted to flush the fluid I used a bedside US I saw the water circulating within the bladder however it was continuously leaking out clear fluid through his penis.  Discussed with patient thatt THIS issue had started prior to the bleeding given that when he came in there was no urine in his bladder already because the urine was going to the path of least resistance through his penis. Hisfoley had been clogged for 10 hours prior to me trying to replace it and he had no urinein bladder---  Discussed the case again with Dr. Apolinar Junes who recommended that he needs to follow-up with [facility] for urinary diversion/urostomy given his complex history.  He cannot have a Foley due to it being inserted through tracks into his ulcers.  She recommended placing a condom catheter until he get follow-up.  However patient states that he is worried that he started to get an infection.  He is already been on the Cipro for 2 days and his heart rates are in the 120s.  We will proceed with septic work-up.  10:52 AM discussed with [facility] urologist Dr. Loraine Leriche Bjurlin who recommends that we just treat a complex UTI now and that he actually has follow-up on October 19 at 2:30 PM with his [facility] urologist and they can decide what to do done.  Does not recommend transfer to [facility] at this time.  12:03 PM Patient's first urine came back looking completely negative and I am wondering if this was actually just from the saline that we flushed early.  We will get another sample now  He technically does not meet sepsis criteria but his heart  rates have remained overall 100 and his platelets are trending up and he is a very complex medical history and high risk for sepsis therefore will start on broad-spectrum antibiotics for concern for possible sepsis from UTI is partially treated with Cipro versus from his wounds on his back.  Patient is comfortable with this plan.  12:28 PM discussed with Dr. Ophelia Charter  from the hospitalist team who stated that she is unwilling to accept the patient here due to his complex history that he needs to go to [facility]  12:50 PM discussed with [facility] urologist who discussed with the coordination team and there are no hospital beds available and they are on diversion so they cannot do ED the ED for a hospitalist bed.  Therefore there is no way for the patient to go to [facility].  Discussed again with Dr. Ophelia Charter who states that she can't admit the patient that he may need to go to [facility] or [facility].  I again discussed with the patient but he is refusing to go anywhere else but here.  He states that he has been admitted here previously.  Dr. Ophelia Charter is going to come down to evaluate the patient.         ____________________________________________   FINAL CLINICAL IMPRESSION(S) / ED DIAGNOSES   Final diagnoses:  Skin ulcer of sacrum, unspecified ulcer stage (HCC)  Tachycardia  Urinary retention      MEDICATIONS GIVEN DURING THIS VISIT:  Medications  ceFEPIme (MAXIPIME) 2 g in sodium chloride 0.9 % 100 mL IVPB (has no administration in time range)  metroNIDAZOLE (FLAGYL) IVPB 500 mg (has no administration in time range)  vancomycin (VANCOCIN) IVPB 1000 mg/200 mL premix (has no administration in time range)  sodium chloride 0.9 % bolus 1,000 mL (0 mLs Intravenous Stopped 02/10/21 1012)     ED Discharge Orders     None        Note:  This document was prepared using Dragon voice recognition software and may include unintentional dictation errors.    Concha Se, MD 02/10/21 1206    Concha Se,  MD 02/10/21 1257

## 2021-02-10 NOTE — ED Notes (Signed)
Attempted to irrigate suprapubic catheter, not able to pass any irrigative fluid, no return noted from cath. Dr. Dolores Frame notified.

## 2021-02-10 NOTE — ED Notes (Signed)
ED Provider at bedside. 

## 2021-02-10 NOTE — H&P (Signed)
History and Physical    Jonathon York MRN:[id number] DOB: 08/20/1975 DOA: 02/10/2021  PCP: Fitzgerald, David P, MD Consultants:  [facility]ski/[facility] - urology Patient coming from:  Home - lives alone; NOK: No one  Chief Complaint: Urinary retention  HPI: Jonathon York is a 45 y.o. 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 The catheter stopped working about 10pm last night.  He tried to irrigate it and it wouldn't work.  He has had this issue before and the catheter was changed out.  They were able to exchange it here in ER. There is blood draining out of his penis, which is very unusual for him.  He is having mild burning but this is chronic for him so unsure if this is different.  Currently, he feels like his heart is racing fast.  He was admitted in May and he had a large fistula so suprapubic cath was placed.  His sacral wound is also not doing well.      ED Course: Chills, concerned for UTI.  Took 4 doses of Cipro.  Suprapubic catheter stopped working.  Was in waiting room x 10 hours, peeing.  Placed suprapubic cath with gross bleeding.  Called urology, thinks irritation and complex UTI.  Recommended flushing 60 cc NS and it came out of penis.  Sphincter isn't working.  Called back urology and she is concerned about replacing foley, place condom cath.  Eventually needs diversion, urostomy.  Has appt on 10/19, needs treatment for complicated UTI.  Review of Systems: As per HPI; otherwise review of systems reviewed and negative.   Ambulatory Status:  Non-ambulatory  COVID Vaccine Status:  None  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    Past Surgical History:  Procedure Laterality Date    AMPUTATION Right 03/19/2018   Procedure: 5th Metatarsal Resection;  Surgeon: Troxler, Matthew, DPM;  Location: [facility] ORS;  Service: Podiatry;  Laterality: Right;   AMPUTATION TOE Right 04/29/2015   Procedure: AMPUTATION TOE;  Surgeon: Todd Cline, MD;  Location: [facility] ORS;  Service: Podiatry;  Laterality: Right;   IRRIGATION AND DEBRIDEMENT BUTTOCKS     TOE AMPUTATION Right     Social History   Socioeconomic History   Marital status: Single    Spouse name: Not on file   Number of children: Not on file   Years of education: Not on file   Highest education level: Not on file  Occupational History   Occupation: disabled  Tobacco Use   Smoking status: Former    Packs/day: 0.50    Types: Cigarettes    Quit date: 2018    Years since quitting: 4.7   Smokeless tobacco: Never  Vaping Use   Vaping Use: Never used  Substance and Sexual Activity   Alcohol use: No   Drug use: Yes    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 Patient is not allergic from all citrus   Sulfa Antibiotics     Throwing up blood     Family History  Problem Relation Age of Onset   Lymphoma Sister     Prior to Admission medications   Medication Sig Start Date End Date Taking? Authorizing Provider  ferrous gluconate (FERGON) 324 MG tablet Take 1 tablet (324 mg total) by mouth 2 (two) times daily with a meal. 09/02/20 10/02/20  Williams, Jamiese M, MD    Physical Exam: Vitals:   02/10/21 1300 02/10/21 1330 02/10/21 1400  02/10/21 1500  BP: 106/74 108/76 102/70 99/69  Pulse: (!) 104 (!) 105 (!) 101 89  Resp: 18  16 16  Temp:      TempSrc:      SpO2: 98% 100% 100% 100%  Weight:      Height:         General:  Appears calm and comfortable and is in NAD Eyes:  PERRL, EOMI, normal lids, iris ENT:  grossly normal hearing, lips & tongue, mmm Neck:  no LAD, masses or thyromegaly Cardiovascular:  RR with mild tachycardia, no m/r/g. No LE edema.  Respiratory:   CTA bilaterally with no wheezes/rales/rhonchi.  Normal respiratory effort. Abdomen:  soft, NT, ND, suprapubic catheter appears C/D/I Back:   normal alignment, no CVAT Skin:  multiple foul-smelling sacral pressure ulcers; L buttock/sacral ulcer is deep with a necrotic-appearing base    Musculoskeletal:  BLE paraparesis Psychiatric:  blunted mood and affect, speech fluent and appropriate, AOx3 Neurologic:  CN 2-12 grossly intact    Radiological Exams on Admission: Independently reviewed - see discussion in A/P where applicable  No results found.  EKG: Independently reviewed.  Sinus tachycardia with rate 111; nonspecific ST changes with no evidence of acute ischemia   Labs on Admission: I have personally reviewed the available labs and imaging studies at the time of the admission.  Pertinent labs:   K+ 3.1 Glucose 118 Albumin 3.1 Lactate 1.1 WBC 10.7 Hgb 10.5 Platelets 588 COVID/flu negative UA: moderate Hgb, 20 ketones, trace LE, 100 protein, rare bacteria, >50 RBC (initial urine sample thought to be primarily saline) Blood and urine cultures pending   Assessment/Plan Active Problems:   Paraplegia (HCC)   Neurogenic bladder   Stage IV pressure ulcer of right buttock (HCC)   Chronic indwelling Foley catheter   Cutaneous fistula   Urinary retention   -Complicated patient with h/o paraplegia and neurogenic bladder -He has had multiple sacral decubitus ulcers with chronic osteomyelitis -He previously had an indwelling foley but  this was changed out to a suprapubic cath due to cutaneous fistula(s) -He presented to the ER last night with urinary retention from the foley and sat in the waiting room through the night, with urinary drainage through his penis -He has been recommended to consider urinary diversion at [facility] and has an appointment with them on 10/19 -Suprapubic catheter was changed in the ER -[facility] was asked to admit for complicated UTI management -Initial UA was not indicative of UTI but was thought to be mostly saline sample; repeat UA is also not overly suggestive of UTI -If UTI, this is a highly complicated one and should involve urology consultation; Dr. Brandon will see. -Additionally, the patient has significant sacral pressure   ulcers that are very concerning in appearance -Surgery has also been consulted and currently does not recommend intervention; Dr. Rodenburg will also see the patient. -Given input from both surgical specialists that admission at [facility] is reasonable, will observe here for now with Vanc/Cefepime/Flagyl -Blood and urine cultures are pending -Ideally, he can be discharged on PO antibiotics with plan for outpatient [facility] urology f/u later this week      Note: This patient has been tested and is negative for the novel coronavirus COVID-19. The patient has NOT been vaccinated against COVID-19.   Level of care: Med-Surg DVT prophylaxis:  Lovenox  Code Status:  Full - confirmed with patient Family Communication: None present Disposition Plan:  The patient is from: home  Anticipated d/c is to: home without HH services   Anticipated d/c date will depend on clinical response to treatment, but possibly as early as tomorrow if he has excellent response to treatment  Patient is currently: acutely ill Consults called: Urology; Surgery  Admission status:  It is my clinical opinion that referral for OBSERVATION is reasonable and necessary in this patient based on the above information provided.  The aforementioned taken together are felt to place the patient at high risk for further clinical deterioration. However it is anticipated that the patient may be medically stable for discharge from the hospital within 24 to 48 hours.    Gratia Disla MD [facility]   How to contact the [facility] Attending or Consulting provider 7A - 7P or covering provider during after hours 7P -7A, for this patient?  Check the care team in [facility] and look for a) attending/consulting [facility] provider listed and b) the [facility] team listed Log into [email or web address] and use [facility] universal password to access. If you do not have the password, please contact the hospital operator. Locate the [facility] provider you are looking for under [facility] and page to a number that you can be directly reached. If you still have difficulty reaching the provider, please page the [facility] (Director on Call) for the Hospitalists listed on amion for assistance.   02/10/2021, 3:37 PM    

## 2021-02-10 NOTE — Progress Notes (Deleted)
History and Physical    Jonathon York GHW:[id number] DOB: Sep 17, 1975 DOA: 02/10/2021  PCP: Mick Sell, MD Consultants:  Ann Maki - urology Patient coming from:  Home - lives alone; NOK: No one  Chief Complaint: Urinary retention  HPI: Jonathon York is a 45 y.o. 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 The catheter stopped working about 10pm last night.  He tried to irrigate it and it wouldn't work.  He has had this issue before and the catheter was changed out.  They were able to exchange it here in ER. There is blood draining out of his penis, which is very unusual for him.  He is having mild burning but this is chronic for him so unsure if this is different.  Currently, he feels like his heart is racing fast.  He was admitted in May and he had a large fistula so suprapubic cath was placed.  His sacral wound is also not doing well.      ED Course: Chills, concerned for UTI.  Took 4 doses of Cipro.  Suprapubic catheter stopped working.  Was in waiting room x 10 hours, peeing.  Placed suprapubic cath with gross bleeding.  Called urology, thinks irritation and complex UTI.  Recommended flushing 60 cc NS and it came out of penis.  Sphincter isn't working.  Called back urology and she is concerned about replacing foley, place condom cath.  Eventually needs diversion, urostomy.  Has appt on 10/19, needs treatment for complicated UTI.  Review of Systems: As per HPI; otherwise review of systems reviewed and negative.   Ambulatory Status:  Non-ambulatory  COVID Vaccine Status:  None  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    Past Surgical History:  Procedure Laterality Date    AMPUTATION Right 03/19/2018   Procedure: 5th Metatarsal Resection;  Surgeon: Recardo Evangelist, DPM;  Location: [facility] ORS;  Service: Podiatry;  Laterality: Right;   AMPUTATION TOE Right 04/29/2015   Procedure: AMPUTATION TOE;  Surgeon: Linus Galas, MD;  Location: [facility] ORS;  Service: Podiatry;  Laterality: Right;   IRRIGATION AND DEBRIDEMENT BUTTOCKS     TOE AMPUTATION Right     Social History   Socioeconomic History   Marital status: Single    Spouse name: Not on file   Number of children: Not on file   Years of education: Not on file   Highest education level: Not on file  Occupational History   Occupation: disabled  Tobacco Use   Smoking status: Former    Packs/day: 0.50    Types: Cigarettes    Quit date: 2018    Years since quitting: 4.7   Smokeless tobacco: Never  Vaping Use   Vaping Use: Never used  Substance and Sexual Activity   Alcohol use: No   Drug use: Yes    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 Patient is not allergic from all citrus   Sulfa Antibiotics     Throwing up blood     Family History  Problem Relation Age of Onset   Lymphoma Sister     Prior to Admission medications   Medication Sig Start Date End Date Taking? Authorizing Provider  ferrous gluconate (FERGON) 324 MG tablet Take 1 tablet (324 mg total) by mouth 2 (two) times daily with a meal. 09/02/20 10/02/20  Charise Killian, MD    Physical Exam: Vitals:   02/10/21 1300 02/10/21 1330 02/10/21 1400  02/10/21 1500  BP: 106/74 108/76 102/70 99/69  Pulse: (!) 104 (!) 105 (!) 101 89  Resp: 18  16 16   Temp:      TempSrc:      SpO2: 98% 100% 100% 100%  Weight:      Height:         General:  Appears calm and comfortable and is in NAD Eyes:  PERRL, EOMI, normal lids, iris ENT:  grossly normal hearing, lips & tongue, mmm Neck:  no LAD, masses or thyromegaly Cardiovascular:  RR with mild tachycardia, no m/r/g. No LE edema.  Respiratory:   CTA bilaterally with no wheezes/rales/rhonchi.  Normal respiratory effort. Abdomen:  soft, NT, ND, suprapubic catheter appears C/D/I Back:   normal alignment, no CVAT Skin:  multiple foul-smelling sacral pressure ulcers; L buttock/sacral ulcer is deep with a necrotic-appearing base    Musculoskeletal:  BLE paraparesis Psychiatric:  blunted mood and affect, speech fluent and appropriate, AOx3 Neurologic:  CN 2-12 grossly intact    Radiological Exams on Admission: Independently reviewed - see discussion in A/P where applicable  No results found.  EKG: Independently reviewed.  Sinus tachycardia with rate 111; nonspecific ST changes with no evidence of acute ischemia   Labs on Admission: I have personally reviewed the available labs and imaging studies at the time of the admission.  Pertinent labs:   K+ 3.1 Glucose 118 Albumin 3.1 Lactate 1.1 WBC 10.7 Hgb 10.5 Platelets 588 COVID/flu negative UA: moderate Hgb, 20 ketones, trace LE, 100 protein, rare bacteria, >50 RBC (initial urine sample thought to be primarily saline) Blood and urine cultures pending   Assessment/Plan Active Problems:   Paraplegia (HCC)   Neurogenic bladder   Stage IV pressure ulcer of right buttock (HCC)   Chronic indwelling Foley catheter   Cutaneous fistula   Urinary retention   -Complicated patient with h/o paraplegia and neurogenic bladder -He has had multiple sacral decubitus ulcers with chronic osteomyelitis -He previously had an indwelling foley but  this was changed out to a suprapubic cath due to cutaneous fistula(s) -He presented to the ER last night with urinary retention from the foley and sat in the waiting room through the night, with urinary drainage through his penis -He has been recommended to consider urinary diversion at [facility] and has an appointment with them on 10/19 -Suprapubic catheter was changed in the ER -[facility] was asked to admit for complicated UTI management -Initial UA was not indicative of UTI but was thought to be mostly saline sample; repeat UA is also not overly suggestive of UTI -If UTI, this is a highly complicated one and should involve urology consultation; Dr. 11/19 will see. -Additionally, the patient has significant sacral pressure  ulcers that are very concerning in appearance -Surgery has also been consulted and currently does not recommend intervention; Dr. Toula Moos will also see the patient. -Given input from both surgical specialists that admission at [facility] is reasonable, will observe here for now with Vanc/Cefepime/Flagyl -Blood and urine cultures are pending -Ideally, he can be discharged on PO antibiotics with plan for outpatient [facility] urology f/u later this week      Note: This patient has been tested and is negative for the novel coronavirus COVID-19. The patient has NOT been vaccinated against COVID-19.   Level of care: Med-Surg DVT prophylaxis:  Lovenox  Code Status:  Full - confirmed with patient Family Communication: None present Disposition Plan:  The patient is from: home  Anticipated d/c is to: home without [facility] services   Anticipated d/c date will depend on clinical response to treatment, but possibly as early as tomorrow if he has excellent response to treatment  Patient is currently: acutely ill Consults called: Urology; Surgery  Admission status:  It is my clinical opinion that referral for OBSERVATION is reasonable and necessary in this patient based on the above information provided.  The aforementioned taken together are felt to place the patient at high risk for further clinical deterioration. However it is anticipated that the patient may be medically stable for discharge from the hospital within 24 to 48 hours.    Jonah Blue MD [facility]   How to contact the [facility] Attending or Consulting provider 7A - 7P or covering provider during after hours 7P -7A, for this patient?  Check the care team in [facility] and look for a) attending/consulting [facility] provider listed and b) the [facility] team listed Log into [email or web address] and use [place] universal password to access. If you do not have the password, please contact the hospital operator. Locate the [facility] provider you are looking for under [facility] and page to a number that you can be directly reached. If you still have difficulty reaching the provider, please page the [facility] (Director on Call) for the Hospitalists listed on amion for assistance.   02/10/2021, 3:37 PM

## 2021-02-10 NOTE — Progress Notes (Signed)
Pharmacy Antibiotic Note  Jonathon York is a 45 y.o. male with medical history significant for traumatic paraplegia w/ neurogenic bladder, anxiety and depression admitted on 02/10/2021 with  wound infection . Pt noted to have multiple sacral decubitus ulcers w/ chronic osteomyelitis. Pharmacy has been consulted for vancomycin and cefepime dosing.  Plan: Pt received vancomycin 1 g IV loading dose in ED. Will continue with 1250 mg IV q24 h Est AUC: 476.7 Scr used 0.8 (rounded up); Vd 0.72, TBW Obtain vancomycin levels around steady state if continued Monitor renal function and adjust dose as clinically indicated  Height: 5\' 10"  (177.8 cm) Weight: 50 kg (110 lb 3.7 oz) IBW/kg (Calculated) : 73  Temp (24hrs), Avg:97.9 F (36.6 C), Min:97.8 F (36.6 C), Max:98 F (36.7 C)  Recent Labs  Lab 02/10/21 0856  WBC 10.7*  CREATININE 0.45*  LATICACIDVEN 1.1    Estimated Creatinine Clearance: 82.5 mL/min (A) (by C-G formula based on SCr of 0.45 mg/dL (L)).    Allergies  Allergen Reactions   Orange Fruit [Citrus] Other (See Comments)    Blisters only from ORANGES!!! Patient is not allergic from all citrus   Sulfa Antibiotics     Throwing up blood     Antimicrobials this admission: 10/15 vancomycin >>  10/15 cefepime >>  10/15 metronidazole >>  Dose adjustments this admission:   Microbiology results: 10/15 BCx: sent 10/15 UCx: sent   Thank you for allowing pharmacy to be a part of this patient's care.  11/15 Terrius Gentile 02/10/2021 3:39 PM

## 2021-02-10 NOTE — ED Notes (Signed)
Skin remains warm, dry and pink. BP WNL. NAD at this time.  Pt expresses frustration at this RN for taking vital signs again.

## 2021-02-10 NOTE — Progress Notes (Signed)
Cross coverage note Patient refuses injectable DVT prophylaxis.  The importance of it was explained to him.  SCDs ordered instead.

## 2021-02-10 NOTE — Progress Notes (Signed)
CODE SEPSIS - PHARMACY COMMUNICATION  **Broad Spectrum Antibiotics should be administered within 1 hour of Sepsis diagnosis**  Time Code Sepsis Called/Page Received: 1200  Antibiotics Ordered: Cefepime + vancomycin + metronidazole  Time of 1st antibiotic administration: 1212  Additional action taken by pharmacy: N/A  Tressie Ellis  02/10/2021  12:09 PM

## 2021-02-10 NOTE — Consult Note (Signed)
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  At last admission, he underwent SP tube placement to help manage/ divert his urine.  He is now under the care of Dr. Daine Gip at [facility] and has an appointment later this week.  His SP tube was changed this morning by Dr. Fuller Plan and position was confirmed both by ultrasound as well as by appropriate irrigation.  Agree admission to [facility] as appropriate as there will likely be no further urologic intervention acutely.  Could consider management with bilateral nephrostomy tubes versus urinary diversion at tertiary care center down the road if he continues to have significant urinary drainage into his wound.  Agree with general surgery consult to ensure that no further debridement is needed, Dr. Claudine Mouton will assess.  Case was discussed with admitting hospitalist,  Dr Ophelia Charter as well as ER doctor, Dr. Fuller Plan all of whom are in agreement.    Vanna Scotland, MD   Vanna Scotland, MD

## 2021-02-10 NOTE — ED Notes (Signed)
Report to maggie, rn.  

## 2021-02-10 NOTE — ED Notes (Signed)
SP tube irrigated with 56ml NS while physician visualized on u/s screen, fluid immediately discharge via penis with flush.

## 2021-02-11 DIAGNOSIS — L89314 Pressure ulcer of right buttock, stage 4: Secondary | ICD-10-CM | POA: Diagnosis not present

## 2021-02-11 DIAGNOSIS — Z9359 Other cystostomy status: Secondary | ICD-10-CM | POA: Diagnosis not present

## 2021-02-11 DIAGNOSIS — N360 Urethral fistula: Secondary | ICD-10-CM | POA: Diagnosis not present

## 2021-02-11 DIAGNOSIS — Z978 Presence of other specified devices: Secondary | ICD-10-CM | POA: Diagnosis not present

## 2021-02-11 DIAGNOSIS — N390 Urinary tract infection, site not specified: Secondary | ICD-10-CM

## 2021-02-11 DIAGNOSIS — G8220 Paraplegia, unspecified: Secondary | ICD-10-CM

## 2021-02-11 DIAGNOSIS — T83511A Infection and inflammatory reaction due to indwelling urethral catheter, initial encounter: Secondary | ICD-10-CM | POA: Diagnosis not present

## 2021-02-11 DIAGNOSIS — N319 Neuromuscular dysfunction of bladder, unspecified: Secondary | ICD-10-CM

## 2021-02-11 LAB — BASIC METABOLIC PANEL
Anion gap: 7 (ref 5–15)
BUN: 9 mg/dL (ref 6–20)
CO2: 26 mmol/L (ref 22–32)
Calcium: 8.3 mg/dL — ABNORMAL LOW (ref 8.9–10.3)
Chloride: 104 mmol/L (ref 98–111)
Creatinine, Ser: 0.34 mg/dL — ABNORMAL LOW (ref 0.61–1.24)
GFR, Estimated: >60 mL/min (ref >60)
Glucose, Bld: 97 mg/dL (ref 70–99)
Potassium: 3.2 mmol/L — ABNORMAL LOW (ref 3.5–5.1)
Sodium: 137 mmol/L (ref 135–145)

## 2021-02-11 LAB — CBC
HCT: 29.4 % — ABNORMAL LOW (ref 39.0–52.0)
Hemoglobin: 8.7 g/dL — ABNORMAL LOW (ref 13.0–17.0)
MCH: 20 pg — ABNORMAL LOW (ref 26.0–34.0)
MCHC: 29.6 g/dL — ABNORMAL LOW (ref 30.0–36.0)
MCV: 67.6 fL — ABNORMAL LOW (ref 80.0–100.0)
Platelets: 486 10*3/uL — ABNORMAL HIGH (ref 150–400)
RBC: 4.35 MIL/uL (ref 4.22–5.81)
RDW: 18.3 % — ABNORMAL HIGH (ref 11.5–15.5)
WBC: 7.1 10*3/uL (ref 4.0–10.5)
nRBC: 0 % (ref 0.0–0.2)

## 2021-02-11 NOTE — Assessment & Plan Note (Signed)
--   Continue. Has follow-up October 19 at 2:30 PM with his [facility] urologist Dr. Daine Gip

## 2021-02-11 NOTE — Assessment & Plan Note (Signed)
--  follow-up at [facility]

## 2021-02-11 NOTE — Assessment & Plan Note (Addendum)
--   Feeling better, culture unrevealing not surprising given patient was on antibiotics prior to admission.  No recent culture data to guide therapy.  Home on empiric Augmentin.

## 2021-02-11 NOTE — Assessment & Plan Note (Addendum)
--  Strict offloading, nutritional support recommended

## 2021-02-11 NOTE — Progress Notes (Signed)
  Progress Note  Jonathon York   NTI:[id number]  DOB: 03-14-1976  DOA: 02/10/2021     0 Date of Service: 02/11/2021   Clinical Course 45 year old man PMH traumatic paraplegia, neurogenic bladder with suprapubic catheter, presented with nonfunctioning suprapubic catheter and chills concerning for UTI, took 4 doses of Cipro at home.  This was exchanged in the emergency department.  He was seen by urology which recommended continuing suprapubic catheter and keeping outpatient follow-up appointment at [facility] on October 19. General surgery recommended wound care --10/15 admitted for complicated UTI --10/16 feels better today but still feels sweaty  Assessment and Plan UTI (urinary tract infection) due to urinary indwelling catheter (HCC) -- Follow culture data, feeling somewhat better.  Continue empiric antibiotics, no recent culture data of note, will narrow antibiotics then to monotherapy, doubt MRSA and anaerobes.  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  Wound care RN consult. 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 Has follow-up October 19 at 2:30 PM with his [facility] urologist Dr. Daine Gip   Subjective:  Feels somewhat better today but still sweaty.  Objective Vitals:   02/10/21 2251 02/11/21 0443 02/11/21 0734 02/11/21 1145  BP: (!) 97/58 92/66 95/62  106/67  Pulse: 100 90 96 91  Resp: 16 17 18 16   Temp: 98.4 F (36.9 C) 98 F (36.7 C) 98 F (36.7 C) 98 F (36.7 C)  TempSrc:  Oral Oral Oral  SpO2: 100% 100% 99% 100%  Weight:      Height:       50 kg  Vital signs were reviewed and unremarkable.  Except  for mild borderline hypotension.  Exam Physical Exam Constitutional:      General: He is not in acute distress.    Appearance: He is not ill-appearing or toxic-appearing.  Cardiovascular:     Rate and Rhythm: Normal rate and regular rhythm.     Heart sounds: No murmur heard. Pulmonary:     Effort: No respiratory distress.     Breath sounds: No wheezing or rales.  Neurological:     Mental Status: He is alert.  Psychiatric:        Mood and Affect: Mood normal.        Behavior: Behavior normal.    Labs / Other Information My review of labs, imaging, notes and other tests is significant for    potassium 3.2, hemoglobin down to 8.7 likely from dilution  Disposition Plan: Status is: Observation  Time spent: 35 minutes [facility] 02/11/2021, 3:57 PM

## 2021-02-11 NOTE — Assessment & Plan Note (Addendum)
--   Appreciate surgical evaluation, continue wound care.  Wound care RN consult appreciated. -- Patient used to be followed by wound care center, he is in transition to going to [facility] -- He has follow-up with urology on Wednesday, he was encouraged to reestablish with wound care clinic

## 2021-02-11 NOTE — Hospital Course (Addendum)
45 year old man PMH traumatic paraplegia, neurogenic bladder with suprapubic catheter, presented with nonfunctioning suprapubic catheter and chills concerning for UTI, took 4 doses of Cipro at home.  This was exchanged in the emergency department.  He was seen by urology which recommended continuing suprapubic catheter and keeping outpatient follow-up appointment at [facility] on October 19. General surgery recommended wound care --10/15 admitted for complicated UTI --10/16 feels better today but still feels sweaty -- 10/17 feeling better, home today

## 2021-02-11 NOTE — Consult Note (Signed)
Urology Consult  I have been asked to see the patient by Dr. Ophelia Charter, for evaluation and management of urethrocutaneous fistula/SP tube malfunction/possible complicated UTI.  Chief Complaint: SP tube malfunction  History of Present Illness: Jonathon York is a 45 y.o. year old male with a personal history of paraplegia with neurogenic bladder who presented yesterday to the emergency room with malfunctioning SP tube.  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  He is now being followed by Dr. Daine Gip at [facility] for this and has an appointment on Wednesday to further his discussion of more definitive urinary diversion.  In the emergency room, his SP tube was replaced however initially, there was some bloody discharge from his penis.  Jonathon York suspects that the SP tube was advanced too far when the balloon was blown up and probably traumatizes urethra a little bit.  Ultimately, position was confirmed by irrigating the catheter which irrigated well as well as bedside ultrasound confirming position.  Since then, and strained clear yellow urine.  He feels that it is in the appropriate position and is not having any discomfort.  There is been some concern about worsening of his sacral decubitus ulcer however general surgery felt that no further debridement is needed.  There is also concern about possible sepsis given tachycardia and hypotension which seems to have normalized with hydration and broad-spectrum IV antibiotics this morning.  Source for this remains unclear, urinalysis only showed RBCs and no evidence of overt UTI however this is always possibly a source as is his sacral wound.  Patient does note that he changes his SP tube every 2 weeks.  He little to no drainage from his SP tube to his wound unless the catheter  is malfunctioning.  He wonders today how he can keep this from happening.   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    Past Surgical History:  Procedure Laterality Date   AMPUTATION Right 03/19/2018   Procedure: 5th Metatarsal Resection;  Surgeon: Recardo Evangelist, DPM;  Location: [facility] ORS;  Service: Podiatry;  Laterality: Right;   AMPUTATION TOE Right 04/29/2015   Procedure: AMPUTATION TOE;  Surgeon: Linus Galas, MD;  Location: [facility] ORS;  Service: Podiatry;  Laterality: Right;   IRRIGATION AND DEBRIDEMENT BUTTOCKS     TOE AMPUTATION Right     Home Medications:  Current Meds  Medication Sig   HYDROcodone-acetaminophen (NORCO/VICODIN) 5-325 MG tablet Take 1 tablet by mouth every 4 (four) hours as needed.    Allergies:  Allergies  Allergen Reactions   Orange Fruit [Citrus] Other (See Comments)    Blisters only from ORANGES!!! Patient is not allergic from all citrus   Sulfa Antibiotics     Throwing up blood     Family History  Problem Relation Age of Onset   Lymphoma Sister     Social History:  reports that he quit smoking about 4 years ago. His smoking use included cigarettes. He smoked an average of .5 packs per day. He has never used smokeless tobacco. He reports current drug use. Drug: Marijuana. He reports that he does not drink alcohol.  ROS: A complete  review of systems was performed.  All systems are negative except for pertinent findings as noted.  Physical Exam:  Vital signs in last 24 hours: Temp:  [98 F (36.7 C)-98.4 F (36.9 C)] 98 F (36.7 C) (10/16 0734) Pulse Rate:  [87-112] 96 (10/16 0734) Resp:  [16-20] 18 (10/16 0734) BP: (92-119)/(58-83) 95/62 (10/16 0734) SpO2:  [96 %-100 %] 99 % (10/16 0734) Constitutional:  Alert and oriented, No acute distress.  Contracted and mildly cachectic appearing. HEENT: [place] AT,  moist mucus membranes.  Trachea midline, no masses GU: Suprapubic tube site clean dry and intact with Foley in place draining clear yellow urine Neurologic: Grossly intact, no focal deficits, moving all 4 extremities Psychiatric: Normal mood and affect.  Good mood this AM.   Laboratory Data:  Recent Labs    02/10/21 0856 02/11/21 0416  WBC 10.7* 7.1  HGB 10.5* 8.7*  HCT 33.9* 29.4*   Recent Labs    02/10/21 0856 02/11/21 0416  NA 139 137  K 3.1* 3.2*  CL 101 104  CO2 24 26  GLUCOSE 118* 97  BUN 12 9  CREATININE 0.45* 0.34*  CALCIUM 9.1 8.3*   No results for input(s): LABPT, INR in the last 72 hours. No results for input(s): LABURIN in the last 72 hours. Results for orders placed or performed during the hospital encounter of 02/10/21  Blood culture (routine x 2)     Status: None (Preliminary result)   Collection Time: 02/10/21  4:56 AM   Specimen: BLOOD  Result Value Ref Range Status   Specimen Description BLOOD BLOOD RIGHT FOREARM  Final   Special Requests   Final    BOTTLES DRAWN AEROBIC AND ANAEROBIC Blood Culture adequate volume   Culture   Final    NO GROWTH < 24 HOURS Performed at [facility], [address] [place], [place] [address]    Report Status PENDING  Incomplete  Resp Panel by RT-PCR (Flu A&B, Covid) Nasopharyngeal Swab     Status: None   Collection Time: 02/10/21  8:56 AM   Specimen: Nasopharyngeal Swab; Nasopharyngeal(NP) swabs in vial transport medium  Result Value Ref Range Status   SARS Coronavirus 2 by RT PCR NEGATIVE NEGATIVE Final    Comment: (NOTE) SARS-CoV-2 target nucleic acids are NOT DETECTED.  The SARS-CoV-2 RNA is generally detectable in upper respiratory specimens during the acute phase of infection. The lowest concentration of SARS-CoV-2 viral copies this assay can detect is 138 copies/mL. A negative result does not preclude SARS-Cov-2 infection and should not be used as the sole basis for treatment or other patient  management decisions. A negative result may occur with  improper specimen collection/handling, submission of specimen other than nasopharyngeal swab, presence of viral mutation(s) within the areas targeted by this assay, and inadequate number of viral copies(<138 copies/mL). A negative result must be combined with clinical observations, patient history, and epidemiological information. The expected result is Negative.  Fact Sheet for Patients:  BloggerCourse.com  Fact Sheet for Healthcare Providers:  SeriousBroker.it  This test is no t yet approved or cleared by the [place] FDA and  has been authorized for detection and/or diagnosis of SARS-CoV-2 by FDA under an Emergency Use Authorization (EUA). This EUA will remain  in effect (meaning this test can be used) for the duration of the COVID-19 declaration under Section 564(b)(1) of the Act, 21 U.S.C.section 360bbb-3(b)(1), unless the authorization is terminated  or revoked sooner.       Influenza A by PCR  NEGATIVE NEGATIVE Final   Influenza B by PCR NEGATIVE NEGATIVE Final    Comment: (NOTE) The Xpert Xpress SARS-CoV-2/FLU/RSV plus assay is intended as an aid in the diagnosis of influenza from Nasopharyngeal swab specimens and should not be used as a sole basis for treatment. Nasal washings and aspirates are unacceptable for Xpert Xpress SARS-CoV-2/FLU/RSV testing.  Fact Sheet for Patients: BloggerCourse.com  Fact Sheet for Healthcare Providers: SeriousBroker.it  This test is not yet approved or cleared by the [place] FDA and has been authorized for detection and/or diagnosis of SARS-CoV-2 by FDA under an Emergency Use Authorization (EUA). This EUA will remain in effect (meaning this test can be used) for the duration of the COVID-19 declaration under Section 564(b)(1) of the Act, 21 U.S.C. section 360bbb-3(b)(1),  unless the authorization is terminated or revoked.  Performed at [facility], [address] [place], [place] [address]   Blood culture (routine x 2)     Status: None (Preliminary result)   Collection Time: 02/10/21  1:53 PM   Specimen: BLOOD  Result Value Ref Range Status   Specimen Description BLOOD BLOOD RIGHT HAND  Final   Special Requests   Final    BOTTLES DRAWN AEROBIC AND ANAEROBIC Blood Culture adequate volume   Culture   Final    NO GROWTH < 24 HOURS Performed at [facility], [address], [place], [place] [address]    Report Status PENDING  Incomplete     Radiologic Imaging: N/a  Impression/Assessment:  45 year old male with neurogenic bladder well-known to the service with urethrocutaneous fistula now with SP tube.  SP tube now functioning well and he is clinically improving without the need for further debridement per general surgery.  Urine does not appear to be infectious source although in the presence of chronic indwelling catheter, is always a concern.  Episodic blood in urine at the time of catheter placement likely secondary to trauma, recently had a cystoscopy at [facility] which is reassuring that there is no underlying pathology.  Urine is now cleared.  Plan:  -No acute intervention needed during this hospital admission -Maintain SP tube to drainage, flush as needed for obstruction -Discussed strategies for avoiding catheter obstruction, agree with every other week exchanges, flush as needed and repositioning of the catheter as needed -Furthered our discussion today about urinary diversion, this would be done at a tertiary care center under the care of Dr. Daine Gip.  We briefly discussed the benefit of avoiding the circumstances of catheter obstruction. -Strongly urge discharge prior to Wednesday  if clinically appropriate so that he can make his appointment at [facility], had to miss an appointment last month secondary to bad weather.  He is very  motivated to go see Dr. Daine Gip.  02/11/2021, 8:13 AM  Vanna Scotland,  MD  Urology will sign off, please page, call, or secure chat if there is any further concerns.

## 2021-02-11 NOTE — Assessment & Plan Note (Signed)
--   Continue suprapubic catheter

## 2021-02-12 DIAGNOSIS — Z9359 Other cystostomy status: Secondary | ICD-10-CM | POA: Diagnosis not present

## 2021-02-12 DIAGNOSIS — N319 Neuromuscular dysfunction of bladder, unspecified: Secondary | ICD-10-CM | POA: Diagnosis not present

## 2021-02-12 DIAGNOSIS — N390 Urinary tract infection, site not specified: Secondary | ICD-10-CM | POA: Diagnosis not present

## 2021-02-12 DIAGNOSIS — T83511A Infection and inflammatory reaction due to indwelling urethral catheter, initial encounter: Secondary | ICD-10-CM | POA: Diagnosis not present

## 2021-02-12 DIAGNOSIS — G8220 Paraplegia, unspecified: Secondary | ICD-10-CM | POA: Diagnosis not present

## 2021-02-12 DIAGNOSIS — L89314 Pressure ulcer of right buttock, stage 4: Secondary | ICD-10-CM | POA: Diagnosis not present

## 2021-02-12 LAB — CBC
HCT: 29.8 % — ABNORMAL LOW (ref 39.0–52.0)
Hemoglobin: 8.9 g/dL — ABNORMAL LOW (ref 13.0–17.0)
MCH: 20.4 pg — ABNORMAL LOW (ref 26.0–34.0)
MCHC: 29.9 g/dL — ABNORMAL LOW (ref 30.0–36.0)
MCV: 68.3 fL — ABNORMAL LOW (ref 80.0–100.0)
Platelets: 440 10*3/uL — ABNORMAL HIGH (ref 150–400)
RBC: 4.36 MIL/uL (ref 4.22–5.81)
RDW: 18.6 % — ABNORMAL HIGH (ref 11.5–15.5)
WBC: 7.2 10*3/uL (ref 4.0–10.5)
nRBC: 0 % (ref 0.0–0.2)

## 2021-02-12 LAB — URINE CULTURE
Culture: <10000 — AB
Culture: <10000 — AB

## 2021-02-12 MED ORDER — AMOXICILLIN-POT CLAVULANATE 875-125 MG PO TABS
1.0000 | ORAL_TABLET | Freq: Once | ORAL | Status: AC
  Administered 2021-02-12: 15:00:00 1 via ORAL
  Filled 2021-02-12: qty 1

## 2021-02-12 MED ORDER — AMOXICILLIN-POT CLAVULANATE 875-125 MG PO TABS: 1.0000 | ORAL_TABLET | Freq: Two times a day (BID) | ORAL | 0 refills | Status: AC

## 2021-02-12 MED ORDER — POTASSIUM CHLORIDE CRYS ER 20 MEQ PO TBCR
40.0000 meq | EXTENDED_RELEASE_TABLET | ORAL | Status: AC
Start: 2021-02-12 — End: 2021-02-12
  Administered 2021-02-12 (×2): 40 meq via ORAL
  Filled 2021-02-12 (×2): qty 2

## 2021-02-12 MED ORDER — DAKINS (1/4 STRENGTH) 0.125 % EX SOLN
Freq: Every day | CUTANEOUS | Status: DC
  Filled 2021-02-12: qty 473

## 2021-02-12 MED ORDER — DAKINS (1/4 STRENGTH) 0.125 % EX SOLN: CUTANEOUS | 0 refills | Status: DC

## 2021-02-12 MED ORDER — HYDROCODONE-ACETAMINOPHEN 5-325 MG PO TABS: 1.0000 | ORAL_TABLET | ORAL | Status: DC | PRN

## 2021-02-12 NOTE — TOC Initial Note (Addendum)
Transition of Care [facility]) - Initial/Assessment Note    Patient Details  Name: Jonathon York MRN: [id number] Date of Birth: 1976/03/19  [facility] CM/SW Contact:    Caryn Section, RN Phone Number: 02/12/2021, 2:10 PM  Clinical Narrative:     Patient lives at home, alone.  He states that he drives himself to appointments and has no concerns about obtaining medications.  Patient states he would like SNF placement for strength building, wound healing and nutrition improvement.  He was a past client of [facility] but he refuses Home Health at this time.  It was explained to patient that SNF placement, if he is eligible, would take several days.  Patient states he is willing to wait but he has an important appointment at [facility] on Wednesday and would rather go home and wait for SNF.  RNCM explained that to place patient in SNF, he would need to be transferred from here.                Addendum:  Patient is at baseline, no skillable need, he would have to speak to his PCP about possible SNF placement.  Patient refuses Home Health at this time.    Expected Discharge Plan: Home w Home Health Services Barriers to Discharge: Continued Medical Work up   Patient Goals and CMS Choice        Expected Discharge Plan and Services Expected Discharge Plan: Home w Home Health Services   Discharge Planning Services: CM Consult Post Acute Care Choice:  (TBD) Living arrangements for the past 2 months: Single Family Home                 DME Arranged:  (Has all needed equipment)         HH Arranged:  (Patient requests SNF)          Prior Living Arrangements/Services Living arrangements for the past 2 months: Single Family Home Lives with:: Self Patient language and need for interpreter reviewed:: Yes (No interpreter required) Do you feel safe going back to the place where you live?: Yes      Need for Family Participation in Patient Care: Yes (Comment) Care giver  support system in place?: No (comment) (Patient states he has no help at thome) Current home services: DME (Patient has all needed equipment, past client of [facility]) Criminal Activity/Legal Involvement Pertinent to Current Situation/Hospitalization: No - Comment as needed  Activities of Daily Living Home Assistive Devices/Equipment: Wheelchair ADL Screening (condition at time of admission) Patient's cognitive ability adequate to safely complete daily activities?: Yes Is the patient deaf or have difficulty hearing?: No Does the patient have difficulty seeing, even when wearing glasses/contacts?: No Does the patient have difficulty concentrating, remembering, or making decisions?: No Patient able to express need for assistance with ADLs?: Yes Does the patient have difficulty dressing or bathing?: No Independently performs ADLs?: No Toileting: Needs assistance Does the patient have difficulty walking or climbing stairs?: Yes Weakness of Legs: Both Weakness of Arms/Hands: None  Permission Sought/Granted Permission sought to share information with : Case Manager Permission granted to share information with : Yes, Verbal Permission Granted     Permission granted to share info w AGENCY: Home Health or SNF agencies        Emotional Assessment   Attitude/Demeanor/Rapport: Engaged Affect (typically observed): Appropriate Orientation: : Oriented to Place, Oriented to Self, Oriented to  Time, Oriented to Situation Alcohol / Substance Use: Not Applicable Psych Involvement:  No (comment)  Admission diagnosis:  Urinary retention [R33.9] Tachycardia [R00.0] Skin ulcer of sacrum, unspecified ulcer stage [facility]) [L98.429] 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   Generalized abdominal pain    UTI (urinary tract infection) 12/02/2019   UTI (urinary tract infection) due to urinary  indwelling catheter (HCC) 12/01/2019   Infected decubitus ulcer 10/27/2019   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HCC) 10/27/2019   Bilateral leg edema 10/27/2019   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    Constipation    Iron deficiency anemia    Sacral decubitus ulcer, stage IV (HCC) 07/13/2019   Intractable pain 11/26/2018   Anxiety 04/22/2018   Acute osteomyelitis of right foot (HCC) 03/18/2018   Pressure injury of skin 03/18/2018   Stage IV pressure ulcer of right buttock (HCC)    Protein-calorie malnutrition, severe 11/20/2016   Herpes simplex infection of penis    Decubitus ulcer 11/19/2016   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 [place] [address] Phone: [phone] Fax: [phone]  [id number] - [place], [place] - [address] AT [facility] OF [address] [place] [address] Phone: [phone] Fax: [phone]  [facility] #[id number] [place], [place] - [address] AT [facility] OF [address] [place] [address] [place] [address] Phone: [phone] Fax: [phone]     Social Determinants of Health (SDOH) Interventions    Readmission Risk Interventions No flowsheet data found.

## 2021-02-12 NOTE — Progress Notes (Signed)
Initial Nutrition Assessment  DOCUMENTATION CODES:  Underweight  INTERVENTION:  Continue current diet as ordered Ensure Enlive po TID, each supplement provides 350 kcal and 20 grams of protein Juven BID, each packet provides 95 calories, 2.5 grams of protein (collagen), and 9.8 grams of carbohydrate (3 grams sugar); also contains 7 grams of L-arginine and L-glutamine, 300 mg vitamin C, 15 mg vitamin E, 1.2 mcg vitamin B-12, 9.5 mg zinc, 200 mg calcium, and 1.5 g  Calcium Beta-hydroxy-Beta-methylbutyrate to support wound healing  NUTRITION DIAGNOSIS:  Increased nutrient needs related to wound healing as evidenced by estimated needs.  GOAL:  Patient will meet greater than or equal to 90% of their needs  MONITOR:  PO intake, Supplement acceptance  REASON FOR ASSESSMENT:  Consult Assessment of nutrition requirement/status  ASSESSMENT:  45 y.o. male with hx of chronic wounds, paraplegia, neurogenic bladder, and osteomyelitis presented to ED with trouble flushing suprapubic catheter.  Pt being followed at [facility] by urology, appointment scheduled for later this week. Attempted to transfer while in ED, [facility] on diversion with no beds available.  Pt has been followed by nutrition team numerous times during past admissions.   Patient being discharged at the time of visit. Reviewed intake and weight. Weight is chronically low, but overall stable. MD discussed poor intake and nutrition with pt. Will add supplements if patient remains inpatient. Noted that pt prefers strawberry flavors.  Average Meal Intake: 10/16-10/17: 33% intake x 3 recorded meals  Nutritionally Relevant Medications: Scheduled Meds:  docusate sodium  100 mg Oral BID   potassium chloride  40 mEq Oral Q4H   Continuous Infusions:  ceFEPime (MAXIPIME) IV 2 g (02/12/21 0738)   PRN Meds: bisacodyl, ondansetron, polyethylene glycol  Labs Reviewed: K 3.2 Creatinine .34  NUTRITION - FOCUSED PHYSICAL EXAM: Defer to follow-up  if pt's discharge is delayed  Diet Order:   Diet Order             Diet - low sodium heart healthy           Diet regular Room service appropriate? Yes; Fluid consistency: Thin  Diet effective now                   EDUCATION NEEDS:  No education needs have been identified at this time  Skin:  Skin Assessment: Skin Integrity Issues: Skin Integrity Issues:: Stage IV Stage IV: bilateral Ischial area, sacrum  Last BM:  unsure, PTA  Height:  Ht Readings from Last 1 Encounters:  02/09/21 5\' 10"  (1.778 m)   Weight:  Wt Readings from Last 1 Encounters:  02/09/21 50 kg   Ideal Body Weight:  75.5 kg  BMI:  Body mass index is 15.82 kg/m.  Estimated Nutritional Needs:  Kcal:  1500-1800 kcal/d Protein:  75-90 g/d Fluid:  1.8-2 L/d   02/11/21, RD, LDN Clinical Dietitian RD pager # available in [facility]  After hours/weekend pager # available in [facility]

## 2021-02-12 NOTE — Discharge Summary (Signed)
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  This was exchanged in the emergency department.  He was seen by urology which recommended continuing suprapubic catheter and keeping outpatient follow-up appointment at [facility] on October 19. General surgery recommended wound care --10/15 admitted for complicated UTI --10/16 feels better today but still feels sweaty -- 10/17 feeling better, home today  UTI (urinary tract infection) due to urinary indwelling catheter (HCC) -- Feeling better, culture unrevealing not surprising given patient was on antibiotics prior to admission.  No recent culture data to guide therapy.  Home on empiric Augmentin.  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  Wound care RN consult appreciated. -- Patient used to be followed by wound care center, he is in transition to going to [facility] -- He has follow-up with urology on Wednesday, he was encouraged to reestablish with wound care clinic  Neurogenic bladder -- Continue suprapubic catheter  Paraplegia (HCC) --Strict offloading, nutritional support recommended  Cutaneous fistula --follow-up at  [facility]  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HCC) -- Continue. Has follow-up October 19 at 2:30 PM with his [facility] urologist Dr. Daine Gip     Procedures performed: none   Condition at discharge: good  Exam Physical Exam Constitutional:      General: He is not in acute distress.    Appearance: He is not ill-appearing.  Cardiovascular:     Rate and Rhythm: Normal rate and regular rhythm.     Heart sounds: No murmur heard. Pulmonary:     Effort: Pulmonary effort is normal. No respiratory distress.     Breath sounds: No wheezing.  Neurological:     Mental Status: He is alert.  Psychiatric:        Mood and Affect: Mood normal.        Behavior: Behavior normal.     Disposition: Home  Discharge time: greater than 30 minutes.  Follow-up Information     Mick Sell, MD .   Specialty: Infectious Diseases Contact information: [address] [place] [address] [phone]                 Allergies as of 02/12/2021       Reactions   Orange Fruit [citrus] Other (See Comments)   Blisters only from ORANGES!!! Patient is not allergic from all citrus   Sulfa Antibiotics    Throwing up blood         Medication List     STOP taking these medications    Dakins (full strength) 0.5 % Soln Replaced by: sodium hypochlorite 0.125 % Soln       TAKE these medications    amoxicillin-clavulanate 875-125 MG tablet Commonly known as: Augmentin Take 1 tablet by mouth 2 (two) times daily  for 14 days.   ferrous gluconate 324 MG tablet Commonly known as: FERGON Take 1 tablet (324 mg total) by mouth 2 (two) times daily with a meal.   HYDROcodone-acetaminophen 5-325 MG tablet Commonly known as: NORCO/VICODIN Take 1 tablet by mouth every 4 (four) hours as needed.   sodium hypochlorite 0.125 % Soln Commonly known as: DAKIN'S 1/4 STRENGTH For 7 days Moisten gauze with 1/4% Dakins and pack right ischial wound Replaces: Dakins (full strength) 0.5 % Soln                Discharge Care Instructions  (From admission, onward)           Start     Ordered   02/12/21 0000  Discharge wound care:       Comments: 1. Clean all wounds with saline, pat dry 2. Pack right ischial wound with 1/4% Dakins moist gauze, making sure to fill wound bed and undermining 3. Cut to fit silver hydrofiber(Aquacel Ag+) and place over the left ischial wound and the sacral wound, top with dry dressing. Change daily.   02/12/21 1430            No results found. Results for orders placed or performed during the hospital encounter of 02/10/21  Blood culture (routine x 2)     Status: None (Preliminary result)   Collection Time: 02/10/21  4:56 AM   Specimen: BLOOD  Result Value Ref Range Status   Specimen Description BLOOD BLOOD RIGHT FOREARM  Final   Special Requests   Final    BOTTLES DRAWN AEROBIC AND ANAEROBIC Blood Culture adequate volume   Culture   Final    NO GROWTH 2 DAYS Performed at [facility], [address], [place], [place] [address]    Report Status PENDING  Incomplete  Urine Culture     Status: Abnormal   Collection Time: 02/10/21  8:56 AM   Specimen: Urine, Clean Catch  Result Value Ref Range Status   Specimen Description   Final    URINE, CLEAN CATCH Performed at [facility], [address], [place], [place] [address]    Special Requests   Final    NONE Performed at [facility], [address], [place], [place] [address]    Culture (A)  Final    <10,000 COLONIES/mL INSIGNIFICANT GROWTH Performed at [facility], [address] [place], [place] [address]    Report Status 02/12/2021 FINAL  Final  Resp Panel by RT-PCR (Flu A&B, Covid) Nasopharyngeal Swab     Status: None   Collection Time: 02/10/21  8:56 AM   Specimen: Nasopharyngeal Swab; Nasopharyngeal(NP) swabs in vial transport medium  Result Value Ref Range Status   SARS Coronavirus 2 by RT PCR NEGATIVE NEGATIVE Final    Comment:  (NOTE) SARS-CoV-2 target nucleic acids are NOT DETECTED.  The SARS-CoV-2 RNA is generally detectable in upper respiratory specimens during the acute phase of infection. The lowest concentration of SARS-CoV-2 viral copies this assay can detect is 138 copies/mL. A negative result does not preclude SARS-Cov-2 infection and should not be used as the sole basis for treatment or other patient management decisions. A negative result may occur with  improper specimen collection/handling, submission of specimen other than nasopharyngeal swab, presence of viral mutation(s) within the areas targeted by this assay, and inadequate number of viral copies(<138 copies/mL). A negative result must be combined with clinical observations, patient history, and epidemiological information. The expected result is Negative.  Fact  Sheet for Patients:  BloggerCourse.com  Fact Sheet for Healthcare Providers:  SeriousBroker.it  This test is no t yet approved or cleared by the [place] FDA and  has been authorized for detection and/or diagnosis of SARS-CoV-2 by FDA under an Emergency Use Authorization (EUA). This EUA will remain  in effect (meaning this test can be used) for the duration of the COVID-19 declaration under Section 564(b)(1) of the Act, 21 U.S.C.section 360bbb-3(b)(1), unless the authorization is terminated  or revoked sooner.       Influenza A by PCR NEGATIVE NEGATIVE Final   Influenza B by PCR NEGATIVE NEGATIVE Final    Comment: (NOTE) The Xpert Xpress SARS-CoV-2/FLU/RSV plus assay is intended as an aid in the diagnosis of influenza from Nasopharyngeal swab specimens and should not be used as a sole basis for treatment. Nasal washings and aspirates are unacceptable for Xpert Xpress SARS-CoV-2/FLU/RSV testing.  Fact Sheet for Patients: BloggerCourse.com  Fact Sheet for Healthcare  Providers: SeriousBroker.it  This test is not yet approved or cleared by the [place] FDA and has been authorized for detection and/or diagnosis of SARS-CoV-2 by FDA under an Emergency Use Authorization (EUA). This EUA will remain in effect (meaning this test can be used) for the duration of the COVID-19 declaration under Section 564(b)(1) of the Act, 21 U.S.C. section 360bbb-3(b)(1), unless the authorization is terminated or revoked.  Performed at [facility], [address], [place], [place] [address]   Urine Culture     Status: Abnormal   Collection Time: 02/10/21 12:45 PM   Specimen: Urine, Clean Catch  Result Value Ref Range Status   Specimen Description   Final    URINE, CLEAN CATCH Performed at [facility], [address] [place], [place] [address]    Special Requests   Final    NONE Performed at [facility], [address], [place], [place] [address]    Culture (A)  Final    <10,000 COLONIES/mL INSIGNIFICANT GROWTH Performed at [facility], [address] [place], [place] [address]    Report Status 02/12/2021 FINAL  Final  Blood culture (routine x 2)     Status: None (Preliminary result)   Collection Time: 02/10/21  1:53 PM   Specimen: BLOOD  Result Value Ref Range Status   Specimen Description BLOOD BLOOD RIGHT HAND  Final   Special Requests   Final    BOTTLES DRAWN AEROBIC AND ANAEROBIC Blood Culture adequate volume   Culture   Final    NO GROWTH 2 DAYS Performed at [facility], [address], [place], [place] [address]    Report Status PENDING  Incomplete    Signed:  Brendia Sacks MD.  [facility] 02/12/2021, 2:32 PM

## 2021-02-12 NOTE — Progress Notes (Addendum)
Patient being discharged home. IV removed before discharge. At first patient wanted to go into a SNF for his wound care and then changed his mind and stated that he wanted to go home, made MD and caseworker aware. Order to do wound care with silver alginate and dakins solution, patient refused and said he would do the dressings when he gets home, made MD also aware of same. Patient switched himself over to a leg bad and took off the regular drainage bad to his suprapubic catheter. Patient was escorted out to the ED entrance because he states that is where he is parked and wants to drive himself home.

## 2021-02-12 NOTE — Consult Note (Signed)
Pharmacy Antibiotic Note  Jonathon York is a 45 y.o. male admitted on 02/10/2021 with suspicion for ca-UTI. 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 Pt also has multiple sacral decubitus ulcers with chronic osteomyelitis. Pt was initiated on empiric antibiotic ( vancomycin, metronidazole, cefepime) coverage for Code Sepsis. Bcx NG x 48h. Ucx <10,000 colonies/mL of insignificant growth. Vancomycin and Metronidazole have been discontinued (10/17). Pt is currently on Day 3 of abx. Pharmacy has been consulted for cefepime dosing.   Plan: Continue Cefepime 2 g IV q8h.  Continue to monitor CrCl and adjust if CrCl <56mL/min to Cefepime 2 g IV q12h. Follow up with length of therapy. Length of therapy recommended 7 -14 days per 2009 IDSA UTI guidelines.   Height: 5\' 10"  (177.8 cm) Weight: 50 kg (110 lb 3.7 oz) IBW/kg (Calculated) : 73  Temp (24hrs), Avg:98.3 F (36.8 C), Min:97.9 F (36.6 C), Max:98.8 F (37.1 C)  Recent Labs  Lab 02/10/21 0856 02/10/21 1852 02/11/21 0416 02/12/21 0814  WBC 10.7*  --  7.1 7.2  CREATININE 0.45*  --  0.34*  --   LATICACIDVEN 1.1 1.4  --   --     Estimated Creatinine Clearance: 82.5 mL/min (A) (by C-G formula based on SCr of 0.34 mg/dL (L)).    Allergies  Allergen Reactions   Orange Fruit [Citrus] Other (See Comments)    Blisters only from ORANGES!!! Patient is not allergic from all citrus   Sulfa Antibiotics     Throwing up blood     Antimicrobials this admission: Vancomycin 10/15 >> 10/17 Metronidazole 10/15  >> 10/17 Cefepime 10/15 >>  Dose adjustments this admission: Vancomycin 1 g IV x loading dose (10/15) , then 1250 mg IV q24h (10/16)  Microbiology results: 10/15 BCx: NG x 48h  10/15 UCx: <10,000 colonies/mL insignificant growth     Thank you for allowing pharmacy to be a part of this patient's care.  11/15 Odalys Win, PharmD Candidate 23'   02/12/2021 1:26 PM

## 2021-02-12 NOTE — Consult Note (Signed)
WOC Nurse Consult Note: Reason for Consult: chronic, nonhealing, bilateral ischial and sacral wounds. Known to WOC team from previous admissions. Last evaluation 4/22 admission.  Patient has chronic wounds with chronic osteomyelitis.  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 Has surgery for same some time soon per notes at outside facility where he is followed for same. Paraplegic who cares for wounds independently at home.  Unsure of pressure redistribution at home in bed and if up in chair Wound type: Chronic Non-healing Stage 4 Pressure Injury: sacrum Chronic Non-healing Stage 4 Pressure injury: left ischium Chronic Non-healing Stage 4 Pressure injury: right ischium Pressure Injury POA: Yes Measurement:see nursing flow sheet  Wound bed: Sacrum; 100% pink, pale, non granular, slick Left ischium: small area lateral wound bed<10% black, remainder of wound 90% pink, slick, non granular Right ischium: 50% grey/black tissue/50% hypergranulation, pink, moist  Drainage (amount, consistency, odor) patient self reports urine that leaks from the bilateral ischial wounds. Otherwise serosanguinous drainage Periwound: intact, epibole of all wound edges Dressing procedure/placement/frequency: Low air loss mattress for pressure redistribution and moisture management  Silver hydrofiber to the sacrum and left ischium; will add chemical debridement with 1/4 %Dakins to the right ischial wound for necrotic tissue present.   Patient needs follow up in wound care center, plastic surgery evaluation if not in past in order to consider any mechanism to heal these wounds. Must have wound edges opened surgical at this point and complete offloading if possible for extended periods of time.     Re consult if needed, will not follow at this time. Thanks   [employer], RN,CWOCN, CNS, CWON-AP [phone])

## 2021-02-14 DIAGNOSIS — L89154 Pressure ulcer of sacral region, stage 4: Secondary | ICD-10-CM | POA: Diagnosis not present

## 2021-02-14 DIAGNOSIS — R339 Retention of urine, unspecified: Secondary | ICD-10-CM | POA: Diagnosis not present

## 2021-02-14 DIAGNOSIS — N360 Urethral fistula: Secondary | ICD-10-CM | POA: Diagnosis not present

## 2021-02-15 LAB — CULTURE, BLOOD (ROUTINE X 2)
Culture: NO GROWTH
Culture: NO GROWTH
Special Requests: ADEQUATE
Special Requests: ADEQUATE

## 2021-02-21 DIAGNOSIS — Z435 Encounter for attention to cystostomy: Secondary | ICD-10-CM | POA: Diagnosis not present

## 2021-03-21 ENCOUNTER — Inpatient Hospital Stay
Admission: EM | Admit: 2021-03-21 | Discharge: 2021-04-02 | DRG: 853 | Disposition: A | Discharge status: Other Acute Inpatient Hospital | Payer: Medicare HMO | Attending: Internal Medicine | Admitting: Internal Medicine

## 2021-03-21 ENCOUNTER — Other Ambulatory Visit: Payer: Self-pay

## 2021-03-21 ENCOUNTER — Emergency Department: Payer: Medicare HMO

## 2021-03-21 DIAGNOSIS — M00851 Arthritis due to other bacteria, right hip: Secondary | ICD-10-CM | POA: Diagnosis present

## 2021-03-21 DIAGNOSIS — W06XXXA Fall from bed, initial encounter: Secondary | ICD-10-CM | POA: Diagnosis present

## 2021-03-21 DIAGNOSIS — N319 Neuromuscular dysfunction of bladder, unspecified: Secondary | ICD-10-CM | POA: Diagnosis present

## 2021-03-21 DIAGNOSIS — A408 Other streptococcal sepsis: Secondary | ICD-10-CM | POA: Diagnosis not present

## 2021-03-21 DIAGNOSIS — Z515 Encounter for palliative care: Secondary | ICD-10-CM

## 2021-03-21 DIAGNOSIS — L8990 Pressure ulcer of unspecified site, unspecified stage: Secondary | ICD-10-CM | POA: Diagnosis not present

## 2021-03-21 DIAGNOSIS — Z681 Body mass index (BMI) 19 or less, adult: Secondary | ICD-10-CM

## 2021-03-21 DIAGNOSIS — Z807 Family history of other malignant neoplasms of lymphoid, hematopoietic and related tissues: Secondary | ICD-10-CM

## 2021-03-21 DIAGNOSIS — E162 Hypoglycemia, unspecified: Secondary | ICD-10-CM | POA: Diagnosis not present

## 2021-03-21 DIAGNOSIS — E161 Other hypoglycemia: Secondary | ICD-10-CM | POA: Diagnosis not present

## 2021-03-21 DIAGNOSIS — D6959 Other secondary thrombocytopenia: Secondary | ICD-10-CM | POA: Diagnosis present

## 2021-03-21 DIAGNOSIS — D649 Anemia, unspecified: Secondary | ICD-10-CM | POA: Diagnosis not present

## 2021-03-21 DIAGNOSIS — F329 Major depressive disorder, single episode, unspecified: Secondary | ICD-10-CM | POA: Diagnosis present

## 2021-03-21 DIAGNOSIS — I96 Gangrene, not elsewhere classified: Secondary | ICD-10-CM | POA: Diagnosis present

## 2021-03-21 DIAGNOSIS — Z91018 Allergy to other foods: Secondary | ICD-10-CM

## 2021-03-21 DIAGNOSIS — E872 Acidosis, unspecified: Secondary | ICD-10-CM | POA: Diagnosis present

## 2021-03-21 DIAGNOSIS — Z87891 Personal history of nicotine dependence: Secondary | ICD-10-CM

## 2021-03-21 DIAGNOSIS — A419 Sepsis, unspecified organism: Secondary | ICD-10-CM | POA: Diagnosis not present

## 2021-03-21 DIAGNOSIS — R404 Transient alteration of awareness: Secondary | ICD-10-CM | POA: Diagnosis not present

## 2021-03-21 DIAGNOSIS — L89314 Pressure ulcer of right buttock, stage 4: Secondary | ICD-10-CM | POA: Diagnosis not present

## 2021-03-21 DIAGNOSIS — N390 Urinary tract infection, site not specified: Secondary | ICD-10-CM | POA: Diagnosis not present

## 2021-03-21 DIAGNOSIS — E43 Unspecified severe protein-calorie malnutrition: Secondary | ICD-10-CM | POA: Diagnosis not present

## 2021-03-21 DIAGNOSIS — B955 Unspecified streptococcus as the cause of diseases classified elsewhere: Secondary | ICD-10-CM | POA: Diagnosis not present

## 2021-03-21 DIAGNOSIS — Z9889 Other specified postprocedural states: Secondary | ICD-10-CM | POA: Diagnosis not present

## 2021-03-21 DIAGNOSIS — D62 Acute posthemorrhagic anemia: Secondary | ICD-10-CM | POA: Diagnosis present

## 2021-03-21 DIAGNOSIS — E860 Dehydration: Secondary | ICD-10-CM | POA: Diagnosis present

## 2021-03-21 DIAGNOSIS — Z1331 Encounter for screening for depression: Secondary | ICD-10-CM | POA: Diagnosis not present

## 2021-03-21 DIAGNOSIS — M868X5 Other osteomyelitis, thigh: Secondary | ICD-10-CM | POA: Diagnosis present

## 2021-03-21 DIAGNOSIS — L89324 Pressure ulcer of left buttock, stage 4: Secondary | ICD-10-CM | POA: Diagnosis present

## 2021-03-21 DIAGNOSIS — L89214 Pressure ulcer of right hip, stage 4: Secondary | ICD-10-CM | POA: Diagnosis present

## 2021-03-21 DIAGNOSIS — Z20822 Contact with and (suspected) exposure to covid-19: Secondary | ICD-10-CM | POA: Diagnosis not present

## 2021-03-21 DIAGNOSIS — N179 Acute kidney failure, unspecified: Secondary | ICD-10-CM | POA: Diagnosis present

## 2021-03-21 DIAGNOSIS — K146 Glossodynia: Secondary | ICD-10-CM | POA: Diagnosis present

## 2021-03-21 DIAGNOSIS — R64 Cachexia: Secondary | ICD-10-CM | POA: Diagnosis present

## 2021-03-21 DIAGNOSIS — L089 Local infection of the skin and subcutaneous tissue, unspecified: Secondary | ICD-10-CM

## 2021-03-21 DIAGNOSIS — A4152 Sepsis due to Pseudomonas: Secondary | ICD-10-CM | POA: Diagnosis not present

## 2021-03-21 DIAGNOSIS — Y92003 Bedroom of unspecified non-institutional (private) residence as the place of occurrence of the external cause: Secondary | ICD-10-CM | POA: Diagnosis not present

## 2021-03-21 DIAGNOSIS — L89154 Pressure ulcer of sacral region, stage 4: Secondary | ICD-10-CM | POA: Diagnosis not present

## 2021-03-21 DIAGNOSIS — K121 Other forms of stomatitis: Secondary | ICD-10-CM | POA: Diagnosis present

## 2021-03-21 DIAGNOSIS — E871 Hypo-osmolality and hyponatremia: Secondary | ICD-10-CM | POA: Diagnosis present

## 2021-03-21 DIAGNOSIS — G8220 Paraplegia, unspecified: Secondary | ICD-10-CM | POA: Diagnosis present

## 2021-03-21 DIAGNOSIS — R52 Pain, unspecified: Secondary | ICD-10-CM | POA: Diagnosis not present

## 2021-03-21 DIAGNOSIS — F419 Anxiety disorder, unspecified: Secondary | ICD-10-CM | POA: Diagnosis present

## 2021-03-21 DIAGNOSIS — E8809 Other disorders of plasma-protein metabolism, not elsewhere classified: Secondary | ICD-10-CM | POA: Diagnosis present

## 2021-03-21 DIAGNOSIS — L8944 Pressure ulcer of contiguous site of back, buttock and hip, stage 4: Secondary | ICD-10-CM | POA: Diagnosis not present

## 2021-03-21 DIAGNOSIS — Z7189 Other specified counseling: Secondary | ICD-10-CM | POA: Diagnosis not present

## 2021-03-21 DIAGNOSIS — Z993 Dependence on wheelchair: Secondary | ICD-10-CM

## 2021-03-21 DIAGNOSIS — R6521 Severe sepsis with septic shock: Secondary | ICD-10-CM | POA: Diagnosis not present

## 2021-03-21 DIAGNOSIS — N19 Unspecified kidney failure: Secondary | ICD-10-CM

## 2021-03-21 DIAGNOSIS — R7881 Bacteremia: Secondary | ICD-10-CM | POA: Diagnosis not present

## 2021-03-21 DIAGNOSIS — Z89421 Acquired absence of other right toe(s): Secondary | ICD-10-CM

## 2021-03-21 DIAGNOSIS — R6520 Severe sepsis without septic shock: Secondary | ICD-10-CM | POA: Diagnosis not present

## 2021-03-21 DIAGNOSIS — E440 Moderate protein-calorie malnutrition: Secondary | ICD-10-CM | POA: Diagnosis not present

## 2021-03-21 DIAGNOSIS — L8995 Pressure ulcer of unspecified site, unstageable: Secondary | ICD-10-CM | POA: Diagnosis not present

## 2021-03-21 DIAGNOSIS — Z9359 Other cystostomy status: Secondary | ICD-10-CM

## 2021-03-21 DIAGNOSIS — T68XXXA Hypothermia, initial encounter: Secondary | ICD-10-CM | POA: Diagnosis not present

## 2021-03-21 DIAGNOSIS — M4628 Osteomyelitis of vertebra, sacral and sacrococcygeal region: Secondary | ICD-10-CM | POA: Diagnosis present

## 2021-03-21 DIAGNOSIS — R000 Tachycardia, unspecified: Secondary | ICD-10-CM | POA: Diagnosis present

## 2021-03-21 DIAGNOSIS — L89304 Pressure ulcer of unspecified buttock, stage 4: Secondary | ICD-10-CM | POA: Diagnosis not present

## 2021-03-21 DIAGNOSIS — F1290 Cannabis use, unspecified, uncomplicated: Secondary | ICD-10-CM | POA: Diagnosis present

## 2021-03-21 DIAGNOSIS — Z882 Allergy status to sulfonamides status: Secondary | ICD-10-CM

## 2021-03-21 DIAGNOSIS — L89150 Pressure ulcer of sacral region, unstageable: Secondary | ICD-10-CM | POA: Diagnosis not present

## 2021-03-21 HISTORY — DX: Acute kidney failure, unspecified: N17.9

## 2021-03-21 LAB — COMPREHENSIVE METABOLIC PANEL
ALT: 21 U/L (ref 0–44)
AST: 28 U/L (ref 15–41)
Albumin: 1.8 g/dL — ABNORMAL LOW (ref 3.5–5.0)
Alkaline Phosphatase: 69 U/L (ref 38–126)
Anion gap: 16 — ABNORMAL HIGH (ref 5–15)
BUN: 123 mg/dL — ABNORMAL HIGH (ref 6–20)
CO2: 16 mmol/L — ABNORMAL LOW (ref 22–32)
Calcium: 5.9 mg/dL — CL (ref 8.9–10.3)
Chloride: 102 mmol/L (ref 98–111)
Creatinine, Ser: 1.86 mg/dL — ABNORMAL HIGH (ref 0.61–1.24)
GFR, Estimated: 45 mL/min — ABNORMAL LOW (ref >60)
Glucose, Bld: 74 mg/dL (ref 70–99)
Potassium: 3.6 mmol/L (ref 3.5–5.1)
Sodium: 134 mmol/L — ABNORMAL LOW (ref 135–145)
Total Bilirubin: 1.4 mg/dL — ABNORMAL HIGH (ref 0.3–1.2)
Total Protein: 6 g/dL — ABNORMAL LOW (ref 6.5–8.1)

## 2021-03-21 LAB — CBC WITH DIFFERENTIAL/PLATELET
Abs Immature Granulocytes: 0.11 10*3/uL — ABNORMAL HIGH (ref 0.00–0.07)
Basophils Absolute: 0 10*3/uL (ref 0.0–0.1)
Basophils Relative: 0 %
Eosinophils Absolute: 0 10*3/uL (ref 0.0–0.5)
Eosinophils Relative: 0 %
HCT: 22.2 % — ABNORMAL LOW (ref 39.0–52.0)
Hemoglobin: 7.1 g/dL — ABNORMAL LOW (ref 13.0–17.0)
Immature Granulocytes: 1 %
Lymphocytes Relative: 9 %
Lymphs Abs: 0.9 10*3/uL (ref 0.7–4.0)
MCH: 20.3 pg — ABNORMAL LOW (ref 26.0–34.0)
MCHC: 32 g/dL (ref 30.0–36.0)
MCV: 63.6 fL — ABNORMAL LOW (ref 80.0–100.0)
Monocytes Absolute: 0.3 10*3/uL (ref 0.1–1.0)
Monocytes Relative: 3 %
Neutro Abs: 9.1 10*3/uL — ABNORMAL HIGH (ref 1.7–7.7)
Neutrophils Relative %: 87 %
Platelets: 104 10*3/uL — ABNORMAL LOW (ref 150–400)
RBC: 3.49 MIL/uL — ABNORMAL LOW (ref 4.22–5.81)
RDW: 20.1 % — ABNORMAL HIGH (ref 11.5–15.5)
WBC: 10.4 10*3/uL (ref 4.0–10.5)
nRBC: 0 % (ref 0.0–0.2)

## 2021-03-21 LAB — URINALYSIS, COMPLETE (UACMP) WITH MICROSCOPIC
Bilirubin Urine: NEGATIVE
Glucose, UA: NEGATIVE mg/dL
Hgb urine dipstick: NEGATIVE
Ketones, ur: 5 mg/dL — AB
Nitrite: NEGATIVE
Protein, ur: 100 mg/dL — AB
Specific Gravity, Urine: 1.018 (ref 1.005–1.030)
WBC, UA: >50 WBC/hpf — ABNORMAL HIGH (ref 0–5)
pH: 7 (ref 5.0–8.0)

## 2021-03-21 LAB — HEMOGLOBIN AND HEMATOCRIT, BLOOD
HCT: 20 % — ABNORMAL LOW (ref 39.0–52.0)
Hemoglobin: 6.3 g/dL — ABNORMAL LOW (ref 13.0–17.0)

## 2021-03-21 LAB — APTT: aPTT: 29 seconds (ref 24–36)

## 2021-03-21 LAB — LACTIC ACID, PLASMA
Lactic Acid, Venous: 1.5 mmol/L (ref 0.5–1.9)
Lactic Acid, Venous: 1 mmol/L (ref 0.5–1.9)

## 2021-03-21 LAB — PREPARE RBC (CROSSMATCH)

## 2021-03-21 LAB — PROTIME-INR
INR: 1.5 — ABNORMAL HIGH (ref 0.8–1.2)
Prothrombin Time: 18.3 seconds — ABNORMAL HIGH (ref 11.4–15.2)

## 2021-03-21 LAB — C-REACTIVE PROTEIN: CRP: 13.1 mg/dL — ABNORMAL HIGH (ref <1.0)

## 2021-03-21 LAB — RESP PANEL BY RT-PCR (FLU A&B, COVID) ARPGX2
Influenza A by PCR: NEGATIVE
Influenza B by PCR: NEGATIVE
SARS Coronavirus 2 by RT PCR: NEGATIVE

## 2021-03-21 LAB — SEDIMENTATION RATE: Sed Rate: 63 mm/hr — ABNORMAL HIGH (ref 0–15)

## 2021-03-21 LAB — PROCALCITONIN: Procalcitonin: 85.62 ng/mL

## 2021-03-21 LAB — TROPONIN I (HIGH SENSITIVITY): Troponin I (High Sensitivity): 13 ng/L (ref <18)

## 2021-03-21 LAB — CK: Total CK: 259 U/L (ref 49–397)

## 2021-03-21 MED ORDER — CALCIUM GLUCONATE-NACL 1-0.675 GM/50ML-% IV SOLN
1.0000 g | Freq: Once | INTRAVENOUS | Status: AC
  Administered 2021-03-21: 1000 mg via INTRAVENOUS
  Filled 2021-03-21: qty 50

## 2021-03-21 MED ORDER — VANCOMYCIN HCL IN DEXTROSE 1-5 GM/200ML-% IV SOLN
1000.0000 mg | Freq: Once | INTRAVENOUS | Status: AC
  Administered 2021-03-21: 1000 mg via INTRAVENOUS
  Filled 2021-03-21: qty 200

## 2021-03-21 MED ORDER — DOCUSATE SODIUM 100 MG PO CAPS: 100.0000 mg | ORAL_CAPSULE | Freq: Two times a day (BID) | ORAL | Status: DC | PRN

## 2021-03-21 MED ORDER — POLYETHYLENE GLYCOL 3350 17 G PO PACK
17.0000 g | PACK | Freq: Every day | ORAL | Status: DC | PRN
  Filled 2021-03-21: qty 1

## 2021-03-21 MED ORDER — DAKINS (1/4 STRENGTH) 0.125 % EX SOLN
Freq: Two times a day (BID) | CUTANEOUS | Status: DC
  Filled 2021-03-21 (×3): qty 473

## 2021-03-21 MED ORDER — LACTATED RINGERS IV BOLUS
500.0000 mL | Freq: Once | INTRAVENOUS | Status: AC
  Administered 2021-03-21: 500 mL via INTRAVENOUS

## 2021-03-21 MED ORDER — SODIUM CHLORIDE 0.9 % IV SOLN
2.0000 g | Freq: Once | INTRAVENOUS | Status: AC
  Administered 2021-03-21: 2 g via INTRAVENOUS
  Filled 2021-03-21: qty 2

## 2021-03-21 MED ORDER — FENTANYL CITRATE PF 50 MCG/ML IJ SOSY
25.0000 ug | PREFILLED_SYRINGE | Freq: Once | INTRAMUSCULAR | Status: AC
  Administered 2021-03-21: 25 ug via INTRAVENOUS
  Filled 2021-03-21: qty 1

## 2021-03-21 MED ORDER — SODIUM CHLORIDE 0.9 % IV BOLUS (SEPSIS)
1000.0000 mL | Freq: Once | INTRAVENOUS | Status: AC
  Administered 2021-03-21: 1000 mL via INTRAVENOUS

## 2021-03-21 MED ORDER — SODIUM CHLORIDE 0.9 % IV SOLN
10.0000 mL/h | Freq: Once | INTRAVENOUS | Status: AC
  Administered 2021-03-21: 10 mL/h via INTRAVENOUS

## 2021-03-21 MED ORDER — LACTATED RINGERS IV SOLN: INTRAVENOUS | Status: DC

## 2021-03-21 MED ORDER — HEPARIN SODIUM (PORCINE) 5000 UNIT/ML IJ SOLN: 5000.0000 [IU] | Freq: Three times a day (TID) | INTRAMUSCULAR | Status: DC

## 2021-03-21 MED ORDER — PIPERACILLIN-TAZOBACTAM 3.375 G IVPB
3.3750 g | Freq: Three times a day (TID) | INTRAVENOUS | Status: AC
  Administered 2021-03-22 – 2021-03-24 (×8): 3.375 g via INTRAVENOUS
  Filled 2021-03-21 (×9): qty 50

## 2021-03-21 MED ORDER — METRONIDAZOLE 500 MG/100ML IV SOLN
500.0000 mg | Freq: Once | INTRAVENOUS | Status: AC
  Administered 2021-03-21: 500 mg via INTRAVENOUS
  Filled 2021-03-21: qty 100

## 2021-03-21 MED ORDER — COLLAGENASE 250 UNIT/GM EX OINT
TOPICAL_OINTMENT | Freq: Two times a day (BID) | CUTANEOUS | Status: DC
  Filled 2021-03-21 (×4): qty 30

## 2021-03-21 MED ORDER — VANCOMYCIN VARIABLE DOSE PER UNSTABLE RENAL FUNCTION (PHARMACIST DOSING): Status: DC

## 2021-03-21 NOTE — ED Notes (Signed)
Pt turned onto their right side at this time.  

## 2021-03-21 NOTE — Progress Notes (Signed)
PHARMACY CONSULT NOTE - FOLLOW UP  Pharmacy Consult for Electrolyte Monitoring and Replacement   Recent Labs: Potassium (mmol/L)  Date Value  03/21/2021 3.6  12/25/2013 4.0   Magnesium (mg/dL)  Date Value  03/01/1593 2.1  12/25/2013 2.1   Calcium (mg/dL)  Date Value  58/59/2924 5.9 (LL)   Calcium, Total (mg/dL)  Date Value  46/28/6381 8.4 (L)   Albumin (g/dL)  Date Value  77/02/6578 1.8 (L)  06/10/2012 2.8 (L)   Sodium (mmol/L)  Date Value  03/21/2021 134 (L)  12/25/2013 142     Assessment: 11/23 @ 1950 :   Ca = 5.9, Alb = 1.8, Corrected Ca = 7.66  Goal of Therapy:  Electrolytes WNL   Plan:  Pt received Calcium gluconate 1 gm IV X 1 @ 2115 and additional Calcium gluconate 1 gm IV X 1 @ 2145.  Will recheck electrolytes on 11/24 with AM labs.   Scherrie Gerlach ,PharmD Clinical Pharmacist 03/21/2021 11:21 PM

## 2021-03-21 NOTE — Progress Notes (Signed)
CODE SEPSIS - PHARMACY COMMUNICATION  **Broad Spectrum Antibiotics should be administered within 1 hour of Sepsis diagnosis**  Time Code Sepsis Called/Page Received: 1900  Antibiotics Ordered: vancomycin 1,000 mg x 1, cefepime 2 grams x 1, and metronidazole 500 mg x 1  Time of 1st antibiotic administration: 1952  Additional action taken by pharmacy: called RN at 1940   Jaynie Bream, PharmD Pharmacy Resident  03/21/2021 6:55 PM

## 2021-03-21 NOTE — H&P (Signed)
NAME:  Jonathon York, MRN:  [id number], DOB:  20-May-1975, LOS: 0 ADMISSION DATE:  03/21/2021, CONSULTATION DATE:  03/21/2021 REFERRING MD:  Dr. Ellender Hose, CHIEF COMPLAINT:  Fall   Brief Pt Description / Synopsis:  45 y.o. Male with PMH of paraplegia of lower extremities, presented after falling with transfer, found after 4 days.  Admitted with Severe Sepsis due to UTI and infected decubitus wounds.  History of Present Illness:  Jonathon York is a 45 year old male with a history of paraplegia of the lower extremities, chronic sacral decubitus ulcers, neurogenic bladder status post suprapubic catheter who presented to [facility] ED on 03/21/2021 after being found down in the floor at home.    The patient reports that he fell 4 days ago when transitioning himself in his house, and he has since remained in the floor, unable to get himself up. He denies any head trauma or LOC from the fall.  His daughter normally checks on him but was away for a few days, and when she checked on him today he was found in the floor.  He was found grossly soiled with dried fecal matter on his back and wounds.  He reports chills, fatigue, weakness, and aches.  He also reports that has been greater than 7 days since he last ate.  He denies any head trauma or loss of consciousness from the fall.  ED Course: Initial vital signs: Temperature 94.7 F rectally, respiratory rate 33, pulse 97, blood pressure 80/58, pulse ox 100% on room air Significant labs: Sodium 134, bicarb 16, anion gap 16, BUN 123, creatinine 1.6, albumin 1.8, CK2 59, CRP 13.1, lactic acid 1.5, WBC 10.4 with neutrophilia, hemoglobin 7.1, hematocrit 22.2, platelets 104 COVID-19 and influenza PCR negative Urinalysis positive for UTI Imaging: Chest x-ray>>Cardiac shadow is within normal limits. Postsurgical changes are noted in the thoracolumbar spine stable from the prior study. Lungs are well aerated. No focal infiltrate or effusion is seen. Tiny calcification is  noted superimposed over the anterior aspect of the right sixth rib. Correlation with prior CT from 2018 shows this to be a small bone island within the anterior right sixth rib. Medications given: 2.5L total of IVF boluses, Cefepime, Flagyl, Vancomycin, 2g Calcium gluconate, ordered for 1 unit pRBC's  He met sepsis criteria on admission, therefore he was given IV fluid resuscitation along with broad-spectrum antibiotics.  Blood and urine cultures obtained.  PCCM is asked to admit the patient to ICU for further work-up and treatment of Severe Sepsis due to UTI and infected decubitus wounds  Pertinent  Medical History  Paraplegia of bilateral lower extremities Neurogenic bladder requiring Suprapubic catheter Osteomyelitis Decubitus Ulcers Anxiety Depression  Micro Data:  03/21/2021: SARS-CoV-2 & Influenza PCR>>negative 03/21/2021: Blood culture x2>> 03/21/2021: Urine >>  Antimicrobials:  Flagyl 11/23 x1 dose Cefepime 11/23 x1 dose Vancomycin 11/23>> Zosyn 11/23>>  Significant Hospital Events: Including procedures, antibiotic start and stop dates in addition to other pertinent events   11/23: Presents to ED after being down on the ground for 4 days at home.  Found to have severe sepsis due to UTI and contaminated sacral decubitus wounds.  PCCM asked to admit.  Will consult General Surgery to evaluate wounds  Interim History / Subjective:  -Initially hypotensive but has responded to IV fluid resuscitation -Source of sepsis found to be UTI and contaminated/infected decubitus wounds -We will consult general surgery -To receive 1 unit of PRBCs  Objective   Blood pressure (!) 88/50, pulse 80, temperature 98.5 F (36.9 C),  temperature source Rectal, resp. rate (!) 24, height 5' 10" (1.778 m), weight 54.4 kg, SpO2 99 %.        Intake/Output Summary (Last 24 hours) at 03/21/2021 2241 Last data filed at 03/21/2021 2225 Gross per 24 hour  Intake 2232.35 ml  Output 800 ml  Net  1432.35 ml   Filed Weights   03/21/21 1825  Weight: 54.4 kg    Examination: General: Cachectic, chronically ill appearing male, laying in bed, grossly contaminated with fecal matter dried onto back and legs, in NAD HENT: Atraumatic, normocephalic, neck supple, no JVD.  Dry mucus membranes Lungs: Clear breath sounds throughout, mild tachypnea, even Cardiovascular: Tachycardia, regular rhythm, s1s2, No M/R/G Abdomen: Soft, nontender, nondistended, no guarding or rebound tenderness, BS+ x4 Extremities: paraplegia of bilateral LE (baseline) Neuro: Awake and alert, oriented to person, place, time GU: Suprapubic catheter in place draining cloudy urine Skin: Grossly contaminated, open, draining decubitus ulcers with dried fecal matter.  See images below.     Resolved Hospital Problem list     Assessment & Plan:   Severe Sepsis due to UTI and contaminated Decubitus Ulcers -Monitor fever curve -Trend WBC's & Procalcitonin -Follow cultures as above -Continue empiric Vancomycin & Zosyn pending cultures & sensitivities -Consult General Surgery for evaluation of wounds and possible debridement -Wound care consulted, appreciate input  Tachycardia due to sepsis & Dehydration -Continuous cardiac monitoring -Maintain MAP >65 -IV fluids (received 30 cc IVF resuscitation in ED) -Vasopressors as needed to maintain MAP goal -Trend lactic acid until normalized (1.5 ~ 1.0) -HS Troponin normal (13)  Acute Kidney Injury Hypocalcemia  Mild Hyponatremia Anion gap metabolic acidosis -Monitor I&O's / urinary output -Follow BMP -Ensure adequate renal perfusion -Avoid nephrotoxic agents as able -Replace electrolytes as indicated -Pharmacy consulted for assistance with electrolyte replacement -Received 2g of Calcium gluconate in ED -IV fluids -Check CK ~ 259 -Follow lactic acid  Anemia without overt s/sx of bleeding Thrombocytopenia, suspect due to sepsis -Monitor for S/Sx of  bleeding -Trend CBC -SCD's for VTE Prophylaxis (will hold chemical prophylaxis for now given anemia) -Transfuse for Hgb <7 -To receive 1 unit pRBC's 11/23    Best Practice (right click and "Reselect all SmartList Selections" daily)   Diet/type: Regular consistency (see orders) DVT prophylaxis: SCD GI prophylaxis: N/A Lines: N/A Foley:  N/A, Chronic suprapubic catheter Code Status:  full code Last date of multidisciplinary goals of care discussion [N/A]  Labs   CBC: Recent Labs  Lab 03/21/21 1950 03/21/21 2153  WBC 10.4  --   NEUTROABS 9.1*  --   HGB 7.1* 6.3*  HCT 22.2* 20.0*  MCV 63.6*  --   PLT 104*  --     Basic Metabolic Panel: Recent Labs  Lab 03/21/21 1950  NA 134*  K 3.6  CL 102  CO2 16*  GLUCOSE 74  BUN 123*  CREATININE 1.86*  CALCIUM 5.9*   GFR: Estimated Creatinine Clearance: 38.6 mL/min (A) (by C-G formula based on SCr of 1.86 mg/dL (H)). Recent Labs  Lab 03/21/21 1950 03/21/21 2049  WBC 10.4  --   LATICACIDVEN 1.5 1.0    Liver Function Tests: Recent Labs  Lab 03/21/21 1950  AST 28  ALT 21  ALKPHOS 69  BILITOT 1.4*  PROT 6.0*  ALBUMIN 1.8*   No results for input(s): LIPASE, AMYLASE in the last 168 hours. No results for input(s): AMMONIA in the last 168 hours.  ABG No results found for: PHART, PCO2ART, PO2ART, HCO3, TCO2, ACIDBASEDEF, O2SAT  Coagulation Profile: Recent Labs  Lab 03/21/21 1950  INR 1.5*    Cardiac Enzymes: Recent Labs  Lab 03/21/21 1950  CKTOTAL 259    HbA1C: No results found for: HGBA1C  CBG: No results for input(s): GLUCAP in the last 168 hours.  Review of Systems:   Positives in BOLD: Gen: Denies fever, chills, weight change, fatigue, night sweats HEENT: Denies blurred vision, double vision, hearing loss, tinnitus, sinus congestion, rhinorrhea, sore throat, neck stiffness, dysphagia PULM: Denies shortness of breath, cough, sputum production, hemoptysis, wheezing CV: Denies chest pain, edema,  orthopnea, paroxysmal nocturnal dyspnea, palpitations GI: Denies abdominal pain, nausea, vomiting, diarrhea, hematochezia, melena, constipation, change in bowel habits GU: Denies dysuria, hematuria, polyuria, oliguria, urethral discharge Endocrine: Denies hot or cold intolerance, polyuria, polyphagia or appetite change Derm: Denies rash, dry skin, scaling or peeling skin change Heme: Denies easy bruising, bleeding, bleeding gums Neuro: Denies headache, numbness, weakness, slurred speech, loss of memory or consciousness   Past Medical History:  He,  has a past medical history of Anxiety, Decubitus ulcer, Depression, Neurogenic bladder, Osteomyelitis ([place]), and Paraparesis of both lower limbs ([place]) (02/21/00).   Surgical History:   Past Surgical History:  Procedure Laterality Date   AMPUTATION Right 03/19/2018   Procedure: 5th Metatarsal Resection;  Surgeon: Albertine Patricia, DPM;  Location: [facility] ORS;  Service: Podiatry;  Laterality: Right;   AMPUTATION TOE Right 04/29/2015   Procedure: AMPUTATION TOE;  Surgeon: Sharlotte Alamo, MD;  Location: [facility] ORS;  Service: Podiatry;  Laterality: Right;   IRRIGATION AND DEBRIDEMENT BUTTOCKS     TOE AMPUTATION Right      Social History:   reports that he quit smoking about 4 years ago. His smoking use included cigarettes. He smoked an average of .5 packs per day. He has never used smokeless tobacco. He reports current drug use. Drug: Marijuana. He reports that he does not drink alcohol.   Family History:  His family history includes Lymphoma in his sister.   Allergies Allergies  Allergen Reactions   Orange Fruit [Citrus] Other (See Comments)    Blisters only from [place]!!! Patient is not allergic from all citrus   Sulfa Antibiotics     Throwing up blood      Home Medications  Prior to Admission medications   Medication Sig Start Date End Date Taking? Authorizing Provider  sodium hypochlorite (DAKIN'S 1/4 STRENGTH) 0.125 % SOLN For 7 days  Moisten gauze with 1/4% Dakins and pack right ischial wound Patient not taking: Reported on 03/21/2021 02/12/21   Samuella Cota, MD     Critical care time: 55 minutes     Darel Hong, AGACNP-BC [facility] Prefer epic messenger for cross cover needs If after hours, please call E-link

## 2021-03-21 NOTE — Consult Note (Signed)
Pharmacy Antibiotic Note  Jonathon York is a 45 y.o. male with medical history including paraplegia, neurogenic bladder, decubitus ulcers / chronic sacral osteomyelitis admitted on 03/21/2021 with  found down at home after four days . There is concern for sepsis. Pharmacy has been consulted for vancomycin and Zosyn dosing.  Plan:  Zosyn 3.375 g IV q8h (4-hr extended infusion)  Vancomycin 1 g IV LD and then dose per levels given AKI --Daily Scr per protocol --Check a random level tomorrow AM to assess clearance  Height: 5\' 10"  (177.8 cm) Weight: 54.4 kg (120 lb) IBW/kg (Calculated) : 73  Temp (24hrs), Avg:96.6 F (35.9 C), Min:94.7 F (34.8 C), Max:98.5 F (36.9 C)  Recent Labs  Lab 03/21/21 1950 03/21/21 2049  WBC 10.4  --   CREATININE 1.86*  --   LATICACIDVEN 1.5 1.0    Estimated Creatinine Clearance: 38.6 mL/min (A) (by C-G formula based on SCr of 1.86 mg/dL (H)).    Allergies  Allergen Reactions   Orange Fruit [Citrus] Other (See Comments)    Blisters only from [place]!!! Patient is not allergic from all citrus   Sulfa Antibiotics     Throwing up blood     Antimicrobials this admission: Cefepime 11/23 x 1 Metronidazole 11/23 x 1 Vancomycin 11/23 >>  Zosyn 11/24 >>   Dose adjustments this admission: N/A  Microbiology results: 11/23 BCx: pending 11/23 UCx: pending   Thank you for allowing pharmacy to be a part of this patient's care.  12/23 03/21/2021 10:17 PM

## 2021-03-21 NOTE — ED Triage Notes (Signed)
Pt arrives via EMS from home after being in the floor for 4 days- pt is a paraplegic- pt's daughter normally comes to check on him but does not live with him- pt temp was 93.1 axillary per EMS but all other VSS- pt was given NS

## 2021-03-21 NOTE — Sepsis Progress Note (Signed)
Sepsis protocol is being followed by eLink. 

## 2021-03-21 NOTE — Progress Notes (Signed)
PHARMACY -  BRIEF ANTIBIOTIC NOTE   Pharmacy has received consult(s) for vancomycin, cefepime, and metronidazole from an ED provider.  The patient's profile has been reviewed for ht/wt/allergies/indication/available labs.    One time order(s) placed for vancomycin 1,000 mg x 1, cefepime 2 grams x 1, and metronidazole 500 mg x 1  Further antibiotics/pharmacy consults should be ordered by admitting physician if indicated.                       Thank you, Jaynie Bream, PharmD Pharmacy Resident  03/21/2021 6:54 PM

## 2021-03-21 NOTE — ED Provider Notes (Signed)
[facility] Emergency Department Provider Note  ____________________________________________   Event Date/Time   First MD Initiated Contact with Patient 03/21/21 1842     (approximate)  I have reviewed the triage vital signs and the nursing notes.   HISTORY  Chief Complaint Fall    HPI Jonathon York is a 45 y.o. male  with h/o paraplegia, chronic decub ulcers, neurogenic bladder s/p SP cath, here after being found down. Pt reports that he fell when transitioning himself in the house about 4 days ago. He has since remained on the floor, unable to get himself back up. Daughter was away but checked on him today, called EMS. Pt was found grossly soiled, with dried fecal matter on his back and wounds. Reports he felt unwell even prior to the fall - has had chills, fatigue, aches. No known cough. Denies any vomiting. It has been >7 days since he last ate. Unable to feel his sacral wounds so unsure if they are more painful than they had been. Denies any head trauma or LOC from the fall. Reports he feels weak, tired.         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   Generalized abdominal pain    UTI (urinary tract infection) 12/02/2019   UTI (urinary tract infection) due to urinary indwelling catheter (HCC) 12/01/2019   Infected decubitus ulcer 10/27/2019   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HCC) 10/27/2019   Bilateral leg edema 10/27/2019   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    Constipation    Iron deficiency anemia    Sacral decubitus ulcer, stage IV (HCC) 07/13/2019   Intractable pain 11/26/2018   Anxiety 04/22/2018   Acute osteomyelitis of right foot (HCC) 03/18/2018    Pressure injury of skin 03/18/2018   Stage IV pressure ulcer of right buttock (HCC)    Protein-calorie malnutrition, severe 11/20/2016   Herpes simplex infection of penis    Decubitus ulcer 11/19/2016   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   Decubitus ulcer of sacral region    Sepsis (HCC) 04/15/2016   Amputation of fourth toe, right, 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   Pressure ulcer stage III 04/30/2015   Sterile pyuria 04/30/2015   Paraplegia (HCC) 04/30/2015   Neurogenic bladder 04/30/2015   Toe osteomyelitis, right (HCC) 04/28/2015   Moderate malnutrition (HCC) 04/28/2015    Past Surgical History:  Procedure Laterality Date   AMPUTATION Right 03/19/2018   Procedure: 5th Metatarsal Resection;  Surgeon: Recardo Evangelist, DPM;  Location: [facility] ORS;  Service: Podiatry;  Laterality: Right;   AMPUTATION TOE Right 04/29/2015   Procedure: AMPUTATION TOE;  Surgeon: Linus Galas, MD;  Location: [facility] ORS;  Service: Podiatry;  Laterality: Right;   IRRIGATION AND DEBRIDEMENT BUTTOCKS     TOE AMPUTATION Right     Prior to Admission medications   Medication Sig Start Date End Date Taking? Authorizing Provider  sodium hypochlorite (DAKIN'S 1/4 STRENGTH) 0.125 % SOLN For 7 days Moisten gauze with 1/4% Dakins and pack right ischial wound Patient not taking: Reported on 03/21/2021 02/12/21   Standley Brooking, MD    Allergies Orange fruit [citrus] and Sulfa antibiotics  Family  History  Problem Relation Age of Onset   Lymphoma Sister     Social History Social History   Tobacco Use   Smoking status: Former    Packs/day: 0.50    Types: Cigarettes    Quit date: 2018    Years since quitting: 4.8   Smokeless tobacco: Never  Vaping Use   Vaping Use: Never used  Substance Use Topics   Alcohol use: No   Drug use: Yes    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    Review of Systems  Review of Systems  Constitutional:  Positive for chills  and fatigue. Negative for fever.  HENT:  Negative for sore throat.   Respiratory:  Negative for shortness of breath.   Cardiovascular:  Negative for chest pain.  Gastrointestinal:  Negative for abdominal pain.  Genitourinary:  Negative for flank pain.  Musculoskeletal:  Negative for neck pain.  Skin:  Positive for wound. Negative for rash.  Allergic/Immunologic: Negative for immunocompromised state.  Neurological:  Positive for weakness. Negative for numbness.  Hematological:  Does not bruise/bleed easily.  All other systems reviewed and are negative.   ____________________________________________  PHYSICAL EXAM:      VITAL SIGNS: ED Triage Vitals  Enc Vitals Group     BP 03/21/21 1824 (!) 80/58     Pulse Rate 03/21/21 1824 96     Resp 03/21/21 1824 (!) 22     Temp --      Temp src --      SpO2 03/21/21 1824 100 %     Weight 03/21/21 1825 120 lb (54.4 kg)     Height 03/21/21 1825 5\' 10"  (1.778 m)     Head Circumference --      Peak Flow --      Pain Score 03/21/21 1825 7     Pain Loc --      Pain Edu? --      Excl. in [place]? --      Physical Exam Vitals and nursing note reviewed.  Constitutional:      Comments: Cachectic, malnourished, grossly contaminated with fecal matter dried onto back and legs  HENT:     Head: Normocephalic.     Mouth/Throat:     Mouth: Mucous membranes are dry.  Eyes:     Conjunctiva/sclera: Conjunctivae normal.     Pupils: Pupils are equal, round, and reactive to light.  Cardiovascular:     Rate and Rhythm: Tachycardia present.     Pulses: Normal pulses.     Heart sounds: No murmur heard. Pulmonary:     Effort: Pulmonary effort is normal.     Comments: Mild tachypnea Abdominal:     General: Abdomen is flat.     Tenderness: There is no abdominal tenderness.  Skin:    Capillary Refill: Capillary refill takes 2 to 3 seconds.     Comments: Grossly contaminated, open, draining decubitus ulcers with dried fecal matter. See image below.   Neurological:     Mental Status: He is oriented to person, place, and time.       ____________________________________________   LABS (all labs ordered are listed, but only abnormal results are displayed)  Labs Reviewed  CBC WITH DIFFERENTIAL/PLATELET - Abnormal; Notable for the following components:      Result Value   RBC 3.49 (*)    Hemoglobin 7.1 (*)    HCT 22.2 (*)    MCV 63.6 (*)    MCH 20.3 (*)  RDW 20.1 (*)    Platelets 104 (*)    Neutro Abs 9.1 (*)    Abs Immature Granulocytes 0.11 (*)    All other components within normal limits  COMPREHENSIVE METABOLIC PANEL - Abnormal; Notable for the following components:   Sodium 134 (*)    CO2 16 (*)    BUN 123 (*)    Creatinine, Ser 1.86 (*)    Calcium 5.9 (*)    Total Protein 6.0 (*)    Albumin 1.8 (*)    Total Bilirubin 1.4 (*)    GFR, Estimated 45 (*)    Anion gap 16 (*)    All other components within normal limits  PROTIME-INR - Abnormal; Notable for the following components:   Prothrombin Time 18.3 (*)    INR 1.5 (*)    All other components within normal limits  URINALYSIS, COMPLETE (UACMP) WITH MICROSCOPIC - Abnormal; Notable for the following components:   Color, Urine AMBER (*)    APPearance CLOUDY (*)    Ketones, ur 5 (*)    Protein, ur 100 (*)    Leukocytes,Ua LARGE (*)    WBC, UA >50 (*)    Bacteria, UA MANY (*)    All other components within normal limits  SEDIMENTATION RATE - Abnormal; Notable for the following components:   Sed Rate 63 (*)    All other components within normal limits  C-REACTIVE PROTEIN - Abnormal; Notable for the following components:   CRP 13.1 (*)    All other components within normal limits  HEMOGLOBIN AND HEMATOCRIT, BLOOD - Abnormal; Notable for the following components:   Hemoglobin 6.3 (*)    HCT 20.0 (*)    All other components within normal limits  RESP PANEL BY RT-PCR (FLU A&B, COVID) ARPGX2  CULTURE, BLOOD (ROUTINE X 2)  CULTURE, BLOOD (ROUTINE X 2)  URINE  CULTURE  LACTIC ACID, PLASMA  LACTIC ACID, PLASMA  APTT  CK  CBC WITH DIFFERENTIAL/PLATELET  PROCALCITONIN  PROCALCITONIN  CBC  BASIC METABOLIC PANEL  CK  VANCOMYCIN, RANDOM  TYPE AND SCREEN  PREPARE RBC (CROSSMATCH)  TROPONIN I (HIGH SENSITIVITY)    ____________________________________________  EKG: Sinus tachycardia, VR 101. PR 145, QRS 99, QTc 489. No acute St elevations. No ischemia or infarct. ________________________________________  RADIOLOGY All imaging, including plain films, CT scans, and ultrasounds, independently reviewed by me, and interpretations confirmed via formal radiology reads.  ED MD interpretation:   CXR: No acute abnormality  Official radiology report(s): DG Chest Port 1 View  Result Date: 03/21/2021 CLINICAL DATA:  Possible sepsis EXAM: PORTABLE CHEST 1 VIEW COMPARISON:  08/28/2020, CT from 02/15/2017 FINDINGS: Cardiac shadow is within normal limits. Postsurgical changes are noted in the thoracolumbar spine stable from the prior study. Lungs are well aerated. No focal infiltrate or effusion is seen. Tinycalcification is noted superimposed over the anterior aspect of the right sixth rib. Correlation with prior CT from 2018 shows this to be a small bone island within the anterior right sixth rib. IMPRESSION: No acute abnormality noted. Electronically Signed   By: Inez Catalina M.D.   On: 03/21/2021 19:30    ____________________________________________  PROCEDURES   Procedure(s) performed (including Critical Care):  .Critical Care Performed by: Duffy Bruce, MD Authorized by: Duffy Bruce, MD   Critical care provider statement:    Critical care time (minutes):  30   Critical care time was exclusive of:  Separately billable procedures and treating other patients   Critical care was necessary to treat or  prevent imminent or life-threatening deterioration of the following conditions:  Cardiac failure, circulatory failure and respiratory failure    Critical care was time spent personally by me on the following activities:  Development of treatment plan with patient or surrogate, discussions with consultants, evaluation of patient's response to treatment, examination of patient, ordering and review of laboratory studies, ordering and review of radiographic studies, ordering and performing treatments and interventions, pulse oximetry, re-evaluation of patient's condition and review of old charts  ____________________________________________  [place] / MDM / [place] / ED COURSE  As part of my medical decision making, I reviewed the following data within the [place] notes reviewed and incorporated, Old chart reviewed, Notes from prior ED visits, and [place] Controlled Substance Database       *Jonathon York was evaluated in Emergency Department on 03/21/2021 for the symptoms described in the history of present illness. He was evaluated in the context of the global COVID-19 pandemic, which necessitated consideration that the patient might be at risk for infection with the SARS-CoV-2 virus that causes COVID-19. Institutional protocols and algorithms that pertain to the evaluation of patients at risk for COVID-19 are in a state of rapid change based on information released by regulatory bodies including the CDC and federal and state organizations. These policies and algorithms were followed during the patient's care in the ED.  Some ED evaluations and interventions may be delayed as a result of limited staffing during the pandemic.*     Medical Decision Making:  45 yo M here with hypothermia, sepsis after being down on ground for 4 days at his house. Primary concern is gross contamination and likely infection of his already severe decubitus ulcers - see image above. DDx includes UTI, PNA. Will check CK for Rhabdo as well. Broad spectrum ABX started with IVF - already received 1L prior to arrival, second  L given for full 30 cc/kg resuscitation. Given gross contamination of decub wounds, will c/s surgery for evaluation, possible debridement.  Pt remains mildly hypotensive after fluids and normothermia. Will admit to the ICU. Will transfuse 1u PRBC empirically given Hgb 7 pre fluids. No signs of active bleeding at this time. ____________________________________________  FINAL CLINICAL IMPRESSION(S) / ED DIAGNOSES  Final diagnoses:  Sepsis without acute organ dysfunction, due to unspecified organism ([place])  AKI (acute kidney injury) ([place])  Uremia  Pressure injury, unstageable, with infection ([place])  Acute on chronic anemia     MEDICATIONS GIVEN DURING THIS VISIT:  Medications  sodium hypochlorite (DAKIN'S 1/4 STRENGTH) topical solution ( Irrigation Not Given 03/21/21 2245)  collagenase (SANTYL) ointment (has no administration in time range)  piperacillin-tazobactam (ZOSYN) IVPB 3.375 g (has no administration in time range)  vancomycin variable dose per unstable renal function (pharmacist dosing) (has no administration in time range)  lactated ringers infusion (has no administration in time range)  docusate sodium (COLACE) capsule 100 mg (has no administration in time range)  polyethylene glycol (MIRALAX / GLYCOLAX) packet 17 g (has no administration in time range)  ceFEPIme (MAXIPIME) 2 g in sodium chloride 0.9 % 100 mL IVPB (0 g Intravenous Stopped 03/21/21 2122)  metroNIDAZOLE (FLAGYL) IVPB 500 mg (0 mg Intravenous Stopped 03/21/21 2027)  vancomycin (VANCOCIN) IVPB 1000 mg/200 mL premix (0 mg Intravenous Stopped 03/21/21 2151)  sodium chloride 0.9 % bolus 1,000 mL (0 mLs Intravenous Stopped 03/21/21 2026)  lactated ringers bolus 500 mL (0 mLs Intravenous Stopped 03/21/21 2152)  fentaNYL (SUBLIMAZE) injection 25 mcg (25  mcg Intravenous Given 03/21/21 2229)  calcium gluconate 1 g/ 50 mL sodium chloride IVPB (0 mg Intravenous Stopped 03/21/21 2225)  calcium gluconate 1 g/ 50 mL sodium  chloride IVPB (0 mg Intravenous Stopped 03/21/21 2254)  0.9 %  sodium chloride infusion (0 mL/hr Intravenous Stopped 03/21/21 2253)     ED Discharge Orders     None        Note:  This document was prepared using Dragon voice recognition software and may include unintentional dictation errors.   Duffy Bruce, MD 03/21/21 [phone]

## 2021-03-21 NOTE — Consult Note (Addendum)
WOC Nurse Consult Note: Patient currently receiving care in [facility] ED25. Consult to general surgery recommended. Reason for Consult: Decubitus ulcers Wound type: Chronic unstageable nonhealing sacral and ischial wounds. Known to the WOC team from previous admissions.  Pressure Injury POA: Yes Dressing procedure/placement/frequency: Cleanse the entire sacral area with soap and water, rinse and pat dry. Moisten Kerlix with Dakin's solution, place over the wounds, followed by dry gauze, cover with ABD pads and secure with Medipore tape. Twice daily dressing changes x 5 days. Once this order has expired then it will be followed by twice daily Santyl in a nickel thick layer, moistened saline gauze and ABD pad secured with medipore tape. This order will remain until wounds have been evaluated by surgical services.  Mattress replacement with low air loss. Turn every 2 hours.    Monitor the wound area(s) for worsening of condition such as: Signs/symptoms of infection, increase in size, development of or worsening of odor, development of pain, or increased pain at the affected locations.   Notify the medical team if any of these develop.  Consider Additional Pressure Injury Prevention Measures such as:  Support surfaces (air mattress) chair cushion [place] # [phone]) Heel offloading boots [place] # [phone]) Turning and Positioning  Measures to reduce shear (draw sheet, knees up) Skin protection Products (Foam dressing) Moisture management products (Critic-Aid Barrier Cream-Purple top) Sween moisturizing lotion (Pink top in clean supply) Nutrition Management Protection for Medical Devices Routine Skin Assessment   Thank you for the consult. WOC nurse will not follow at this time.   Please re-consult the WOC team if needed.  Renaldo Reel Katrinka Blazing, MSN, RN, CMSRN, [facility] S[facility], [facility] Wound Treatment Associate Pager [phone]

## 2021-03-22 ENCOUNTER — Encounter: Payer: Self-pay | Admitting: Pulmonary Disease

## 2021-03-22 DIAGNOSIS — Z1331 Encounter for screening for depression: Secondary | ICD-10-CM

## 2021-03-22 DIAGNOSIS — A419 Sepsis, unspecified organism: Secondary | ICD-10-CM

## 2021-03-22 LAB — BLOOD CULTURE ID PANEL (REFLEXED) - BCID2

## 2021-03-22 LAB — BPAM RBC
Blood Product Expiration Date: 202301022359
ISSUE DATE / TIME: 202211232318
Unit Type and Rh: 5100

## 2021-03-22 LAB — TYPE AND SCREEN
ABO/RH(D): O POS
Antibody Screen: NEGATIVE
Unit division: 0

## 2021-03-22 LAB — VANCOMYCIN, RANDOM: Vancomycin Rm: 15

## 2021-03-22 LAB — BASIC METABOLIC PANEL
Anion gap: 14 (ref 5–15)
BUN: 82 mg/dL — ABNORMAL HIGH (ref 6–20)
CO2: 20 mmol/L — ABNORMAL LOW (ref 22–32)
Calcium: 7.3 mg/dL — ABNORMAL LOW (ref 8.9–10.3)
Chloride: 107 mmol/L (ref 98–111)
Creatinine, Ser: 0.99 mg/dL (ref 0.61–1.24)
GFR, Estimated: >60 mL/min (ref >60)
Glucose, Bld: 89 mg/dL (ref 70–99)
Potassium: 3.5 mmol/L (ref 3.5–5.1)
Sodium: 141 mmol/L (ref 135–145)

## 2021-03-22 LAB — CBC
HCT: 30.5 % — ABNORMAL LOW (ref 39.0–52.0)
Hemoglobin: 9.6 g/dL — ABNORMAL LOW (ref 13.0–17.0)
MCH: 21.4 pg — ABNORMAL LOW (ref 26.0–34.0)
MCHC: 31.5 g/dL (ref 30.0–36.0)
MCV: 67.9 fL — ABNORMAL LOW (ref 80.0–100.0)
Platelets: 97 10*3/uL — ABNORMAL LOW (ref 150–400)
RBC: 4.49 MIL/uL (ref 4.22–5.81)
RDW: 24.1 % — ABNORMAL HIGH (ref 11.5–15.5)
WBC: 9.9 10*3/uL (ref 4.0–10.5)
nRBC: 0 % (ref 0.0–0.2)

## 2021-03-22 LAB — GLUCOSE, CAPILLARY: Glucose-Capillary: 76 mg/dL (ref 70–99)

## 2021-03-22 LAB — CK: Total CK: 161 U/L (ref 49–397)

## 2021-03-22 LAB — PROCALCITONIN: Procalcitonin: 36.94 ng/mL

## 2021-03-22 MED ORDER — MORPHINE SULFATE (PF) 2 MG/ML IV SOLN
2.0000 mg | INTRAVENOUS | Status: DC | PRN
  Administered 2021-03-22 – 2021-03-26 (×8): 2 mg via INTRAVENOUS
  Filled 2021-03-22 (×8): qty 1

## 2021-03-22 MED ORDER — VANCOMYCIN HCL 1250 MG/250ML IV SOLN
1250.0000 mg | INTRAVENOUS | Status: DC
  Administered 2021-03-22 – 2021-03-23 (×2): 1250 mg via INTRAVENOUS
  Filled 2021-03-22 (×2): qty 250

## 2021-03-22 MED ORDER — HYDROCODONE-ACETAMINOPHEN 5-325 MG PO TABS
1.0000 | ORAL_TABLET | Freq: Four times a day (QID) | ORAL | Status: DC | PRN
  Administered 2021-03-24: 2 via ORAL
  Filled 2021-03-22: qty 2

## 2021-03-22 MED ORDER — CHLORHEXIDINE GLUCONATE CLOTH 2 % EX PADS
6.0000 | MEDICATED_PAD | Freq: Every day | CUTANEOUS | Status: DC
  Administered 2021-03-22 – 2021-04-02 (×12): 6 via TOPICAL

## 2021-03-22 NOTE — Consult Note (Signed)
  Consult on this patient who has paraplegia, chronic decub ulcers, neurogenic bladder who was found on the floor of his home, having fallen and laying there x 4 days.   Patient was asleep upon writer's arrival to his room. Writer talked to patient's bedside RN, who stated that the attending MD thought the patient may be depressed, so put a psych consult in. Writer advised RN that we will return later or tomorrow to see patient. RN states patient is having wound debridement tomorrow, so call prior to visit.

## 2021-03-22 NOTE — Progress Notes (Signed)
PHARMACY CONSULT NOTE - FOLLOW UP  Pharmacy Consult for Electrolyte Monitoring and Replacement   Recent Labs: Potassium (mmol/L)  Date Value  03/22/2021 3.5  12/25/2013 4.0   Magnesium (mg/dL)  Date Value  02/23/2535 2.1  12/25/2013 2.1   Calcium (mg/dL)  Date Value  64/40/3474 7.3 (L)   Calcium, Total (mg/dL)  Date Value  25/95/6387 8.4 (L)   Albumin (g/dL)  Date Value  56/43/3295 1.8 (L)  06/10/2012 2.8 (L)   Sodium (mmol/L)  Date Value  03/22/2021 141  12/25/2013 142     Assessment: 45 y.o. Male with PMH of paraplegia of lower extremities, presented after falling with transfer, found after 4 days.  Admitted with Severe Sepsis due to UTI and infected decubitus wounds.  Goal of Therapy:  Electrolytes WNL   Plan:  No electrolyte replacement needed at this time. F/u with AM labs.   Ronnald Ramp ,PharmD Clinical Pharmacist 03/22/2021 12:03 PM

## 2021-03-22 NOTE — Consult Note (Signed)
SURGICAL CONSULTATION NOTE   HISTORY OF PRESENT ILLNESS (HPI):  45 y.o. male presented to [facility] ED after being found on the floor at home. Patient reports he fell around 4 to 5 days before he was found.  He remained in the floor due to inability to transfer due to paraplegia.  He was admitted due to sepsis due to UTI.  He was also found with infected decubitus ulcer.  As per history he was found surrounded by feces all over his wounds.  Patient has chronic sacral and decubitus wounds that were properly taking care before this admission.  In this admission he was found with necrotic tissue.  Patient endorses he has localized pain all over his ulcers.  No pain radiation.  Aggravating factor is applying pressure.  No alleviating factors.  Patient denies any fever or chills.  Surgery is consulted by Dr. Karna Christmas in this context for evaluation and management of infected stage IV pressure ulcers.  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 Authorizing Provider  sodium hypochlorite (DAKIN'S 1/4 STRENGTH) 0.125 % SOLN For 7 days Moisten gauze with 1/4% Dakins and pack right ischial wound Patient not taking: Reported on 03/21/2021 02/12/21   Standley Brooking, MD     ALLERGIES:   Allergies  Allergen Reactions   Orange Fruit [Citrus] Other (See Comments)    Blisters only from ORANGES!!! Patient is not allergic from all citrus   Sulfa Antibiotics     Throwing up blood      SOCIAL HISTORY:  Social History   Socioeconomic History   Marital status: Single    Spouse name: Not on file   Number of children: Not on file   Years of education: Not on file   Highest education level: Not on file  Occupational History   Occupation: disabled  Tobacco Use   Smoking status: Former    Packs/day: 0.50    Types: Cigarettes    Quit date: 2018    Years since quitting: 4.8   Smokeless tobacco: Never  Vaping Use   Vaping Use: Never used  Substance and Sexual Activity   Alcohol use: No   Drug use: Yes    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   Sexual activity: Never  Other Topics Concern   Not on file  Social History Narrative   Not on file   Social Determinants of Health   Financial Resource Strain: Not on file  Food Insecurity: Not on file  Transportation Needs: Not on file  Physical Activity: Not on file  Stress: Not on file  Social Connections: Not on file  Intimate Partner Violence: Not on file      FAMILY  HISTORY:  Family History  Problem Relation Age of Onset   Lymphoma Sister      REVIEW OF SYSTEMS:  Constitutional: Positive weight loss, negative fever, chills, or sweats  Eyes: denies any other vision changes, history of eye injury  ENT: denies sore throat, hearing problems  Respiratory: denies shortness of breath, wheezing  Cardiovascular: denies chest pain, palpitations  Gastrointestinal: Denies abdominal pain, nausea and vomiting Genitourinary: denies burning with urination or urinary frequency Musculoskeletal: denies any other joint pains or cramps  Skin: denies any other rashes or skin discolorations.  Positive for decubitus ulcers Neurological: denies any other headache, dizziness, weakness.  Positive for paraplegia.   Positive for sensation Psychiatric: Positive for depression   All other review of systems were negative   VITAL SIGNS:  Temp:  [94.7 F (34.8 C)-98.6 F (37 C)] 98.6 F (37 C) (11/24 0800) Pulse Rate:  [80-108] 98 (11/24 1000) Resp:  [17-35] 23 (11/24 1000) BP: (80-100)/(44-66) 98/66 (11/24 1000) SpO2:  [96 %-100 %] 100 % (11/24 1000) Weight:  [40.3 kg-54.4 kg] 40.3 kg (11/24 0500)     Height: 5\' 10"  (177.8 cm) Weight: 40.3 kg BMI (Calculated): 12.75   INTAKE/OUTPUT:  This shift: Total I/O In: 202.4 [I.V.:164.3; IV Piggyback:38.1] Out: -   Last 2 shifts: @IOLAST2SHIFTS @   PHYSICAL EXAM:  Constitutional:  -- Cachectic -- Awake, alert, and oriented x3  Eyes:  -- Pupils equally round and reactive to light  -- No scleral icterus  Ear, nose, and throat:  -- No jugular venous distension  Pulmonary:  -- No crackles  -- Equal breath sounds bilaterally -- Breathing non-labored at rest Cardiovascular:  -- S1, S2 present  -- No pericardial rubs Gastrointestinal:  -- Abdomen soft, nontender, non-distended, no guarding or rebound tenderness -- No abdominal masses appreciated, pulsatile or otherwise  Musculoskeletal and Integumentary:  -- Wounds: Multiple sacral and decubitus ulcer with necrotic tissue and purulence.  Malodorous. -- Extremities: B/L lower extremity with proper area Neurologic:  -- Motor function: No motor function of the lower extremities -- Sensation: Patient has sensation in the lower back but decrease in station of the lower extremities.   Labs:  CBC Latest Ref Rng & Units 03/22/2021 03/21/2021 03/21/2021  WBC 4.0 - 10.5 K/uL 9.9 - 10.4  Hemoglobin 13.0 - 17.0 g/dL 9.6(L) 6.3(L) 7.1(L)  Hematocrit 39.0 - 52.0 % 30.5(L) 20.0(L) 22.2(L)  Platelets 150 - 400 K/uL 97(L) - 104(L)   CMP Latest Ref Rng & Units 03/22/2021 03/21/2021 02/11/2021  Glucose 70 - 99 mg/dL 89 74 97  BUN 6 - 20 mg/dL 82(H) 123(H) 9  Creatinine 0.61 - 1.24 mg/dL 0.99 1.86(H) 0.34(L)   Sodium 135 - 145 mmol/L 141 134(L) 137  Potassium 3.5 - 5.1 mmol/L 3.5 3.6 3.2(L)  Chloride 98 - 111 mmol/L 107 102 104  CO2 22 - 32 mmol/L 20(L) 16(L) 26  Calcium 8.9 - 10.3 mg/dL 7.3(L) 5.9(LL) 8.3(L)  Total Protein 6.5 - 8.1 g/dL - 6.0(L) -  Total Bilirubin 0.3 - 1.2 mg/dL - 1.4(H) -  Alkaline Phos 38 - 126 U/L - 69 -  AST 15 - 41 U/L - 28 -  ALT 0 - 44 U/L - 21 -    Imaging studies: Personally evaluated the images of the chest x-ray. CLINICAL DATA:  Possible sepsis   EXAM: PORTABLE CHEST 1 VIEW   COMPARISON:  08/28/2020, CT from 02/15/2017   FINDINGS: Cardiac shadow is within normal limits. Postsurgical changes are noted in the thoracolumbar spine stable  from the prior study. Lungs are well aerated. No focal infiltrate or effusion is seen. Tinycalcification is noted superimposed over the anterior aspect of the right sixth rib. Correlation with prior CT from 2018 shows this to be a small bone island within the anterior right sixth rib.   IMPRESSION: No acute abnormality noted.     Electronically Signed   By: Inez Catalina M.D.   On: 03/21/2021 19:30  Assessment/Plan:  45 y.o. 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  Patient with complex multiple sacral and decubitus ulcers.  They were previously adequately treated but patient was found in the floor surrounded by feces for at least 4 to 5 days.  Upon evaluation there is abundant necrotic and purulent tissue.  I had a long discussion with the patient about the prognosis of these ulcers.  I do think that these ulcers were ever heal completely but I think that patient will benefit of debridement due to recurrent necrotic tissue and infection for eventual long-term treatment at bedside how he was receiving before.  The goal is to keep and clean.  With a nutritional status and I think this patient is a  candidate for any further complex surgical treatments.  The patient reported he understood and agreed to proceed with debridement of the wounds.  We will schedule him for tomorrow.  N.p.o. after midnight.  Continue antibiotic therapy as per primary team.  Case discussed with ICU physician  Arnold Long, MD

## 2021-03-22 NOTE — Consult Note (Signed)
[facility] Consult   Reason for Consult:  concern for possible depression Referring Physician:  Lanney Gins [name] MRN:  [id number] Principal Diagnosis: Sepsis ([place]) Diagnosis:  Principal Problem:   Sepsis ([place])   Total Time spent with patient: 20 minutes  Subjective:  "I had an accident, that's why I am here." Jonathon York is a 45 y.o. male patient admitted with Sepsis.  HPI:  Patient was brought to hospital via EMS after a fall out of his wheelchair; he remained on the floor x 4 days before his daughter found him.  Patient was approached by Probation officer. He was sleepy, states he is in pain, but became alert when spoken to. Writer introduced self and asked if there was anything that psychiatry could help him with. Patient was calm, pleasant, cooperative, speaks in low voice. He reports that he is not depressed. He is thankful that his daughter found him and he is glad to be alive. Patient states that he lives alone and does well on his own. He expresses some concern about caring for himself once he is discharged. He believes he will need to go to a SNF until he regains his strength because he states "I am no where near where I was." He denies any past history of psychiatric care, either therapy or medications, although his problem list includes depression. He denies thoughts of self-harm or suicide. Denies auditory or visual hallucinations or paranoia. Patient will have surgery tomorrow to debride his wounds.  Advised patient we will continue to check on him while he is in the hospital.   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    Past Surgical History:   Procedure Laterality Date   AMPUTATION Right 03/19/2018   Procedure: 5th Metatarsal Resection;  Surgeon: Albertine Patricia, DPM;  Location: [facility] ORS;  Service: Podiatry;  Laterality: Right;   AMPUTATION TOE Right 04/29/2015   Procedure: AMPUTATION TOE;  Surgeon: Sharlotte Alamo, MD;  Location: [facility] ORS;  Service: Podiatry;  Laterality: Right;   IRRIGATION AND DEBRIDEMENT BUTTOCKS     TOE AMPUTATION Right    Family History:  Family History  Problem Relation Age of Onset   Lymphoma Sister    Family Psychiatric  History: denies Social History:  Social History   Substance and Sexual Activity  Alcohol Use No     Social History   Substance and Sexual Activity  Drug Use Yes   Types: Marijuana   Comment: daily use with vapes    Social History   Socioeconomic History   Marital status: Single    Spouse name: Not on file   Number of children: Not on file   Years of education: Not on file   Highest education level: Not on file  Occupational History   Occupation: disabled  Tobacco Use   Smoking status: Former    Packs/day: 0.50    Types: Cigarettes    Quit date: 2018    Years since quitting: 4.8   Smokeless tobacco: Never  Vaping Use   Vaping Use: Never used  Substance and Sexual Activity   Alcohol use: No   Drug use:  Yes    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   Sexual activity: Never  Other Topics Concern   Not on file  Social History Narrative   Not on file   Social Determinants of Health   Financial Resource Strain: Not on file  Food Insecurity: Not on file  Transportation Needs: Not on file  Physical Activity: Not on file  Stress: Not on file  Social Connections: Not on file   Additional Social History:    Allergies:   Allergies  Allergen Reactions   Orange Fruit [Citrus] Other (See Comments)    Blisters only from [place]!!! Patient is not allergic from all citrus   Sulfa Antibiotics     Throwing up blood     Labs:  Results for orders  placed or performed during the hospital encounter of 03/21/21 (from the past 48 hour(s))  Procalcitonin - Baseline     Status: None   Collection Time: 03/21/21  6:34 PM  Result Value Ref Range   Procalcitonin 85.62 ng/mL    Comment:        Interpretation: PCT >= 10 ng/mL: Important systemic inflammatory response, almost exclusively due to severe bacterial sepsis or septic shock. (NOTE)       Sepsis PCT Algorithm           Lower Respiratory Tract                                      Infection PCT Algorithm    ----------------------------     ----------------------------         PCT < 0.25 ng/mL                PCT < 0.10 ng/mL          Strongly encourage             Strongly discourage   discontinuation of antibiotics    initiation of antibiotics    ----------------------------     -----------------------------       PCT 0.25 - 0.50 ng/mL            PCT 0.10 - 0.25 ng/mL               OR       >80% decrease in PCT            Discourage initiation of                                            antibiotics      Encourage discontinuation           of antibiotics    ----------------------------     -----------------------------         PCT >= 0.50 ng/mL              PCT 0.26 - 0.50 ng/mL                AND       <80% decrease in PCT             Encourage initiation of  antibiotics       Encourage continuation           of antibiotics    ----------------------------     -----------------------------        PCT >= 0.50 ng/mL                  PCT > 0.50 ng/mL               AND         increase in PCT                  Strongly encourage                                      initiation of antibiotics    Strongly encourage escalation           of antibiotics                                     -----------------------------                                           PCT <= 0.25 ng/mL                                                 OR                                         > 80% decrease in PCT                                      Discontinue / Do not initiate                                             antibiotics  Performed at [facility], [place], [place] [address]   Troponin I (High Sensitivity)     Status: None   Collection Time: 03/21/21  6:34 PM  Result Value Ref Range   Troponin I (High Sensitivity) 13 <18 ng/L    Comment: (NOTE) Elevated high sensitivity troponin I (hsTnI) values and significant  changes across serial measurements may suggest ACS but many other  chronic and acute conditions are known to elevate hsTnI results.  Refer to the "Links" section for chest pain algorithms and additional  guidance. Performed at [facility], [address] [place], [place] [address]   CBC with Differential     Status: Abnormal   Collection Time: 03/21/21  7:50 PM  Result Value Ref Range   WBC 10.4 4.0 - 10.5 K/uL   RBC 3.49 (L) 4.22 - 5.81 MIL/uL   Hemoglobin 7.1 (L) 13.0 - 17.0 g/dL    Comment: Reticulocyte Hemoglobin testing may be clinically indicated, consider  ordering this additional test [id number]    HCT 22.2 (L) 39.0 - 52.0 %   MCV 63.6 (L) 80.0 - 100.0 fL   MCH 20.3 (L) 26.0 - 34.0 pg   MCHC 32.0 30.0 - 36.0 g/dL   RDW 20.1 (H) 11.5 - 15.5 %   Platelets 104 (L) 150 - 400 K/uL    Comment: Immature Platelet Fraction may be clinically indicated, consider ordering this additional test [id number]    nRBC 0.0 0.0 - 0.2 %   Neutrophils Relative % 87 %   Neutro Abs 9.1 (H) 1.7 - 7.7 K/uL   Lymphocytes Relative 9 %   Lymphs Abs 0.9 0.7 - 4.0 K/uL   Monocytes Relative 3 %   Monocytes Absolute 0.3 0.1 - 1.0 K/uL   Eosinophils Relative 0 %   Eosinophils Absolute 0.0 0.0 - 0.5 K/uL   Basophils Relative 0 %   Basophils Absolute 0.0 0.0 - 0.1 K/uL   WBC Morphology MORPHOLOGY UNREMARKABLE    RBC Morphology MORPHOLOGY UNREMARKABLE    Smear Review MORPHOLOGY UNREMARKABLE      Comment: PLATELET COUNT CONFIRMED BY SMEAR   Immature Granulocytes 1 %   Abs Immature Granulocytes 0.11 (H) 0.00 - 0.07 K/uL    Comment: Performed at [facility], [address], [place], [place] [address]  Resp Panel by RT-PCR (Flu A&B, Covid) Nasopharyngeal Swab     Status: None   Collection Time: 03/21/21  7:50 PM   Specimen: Nasopharyngeal Swab; Nasopharyngeal(NP) swabs in vial transport medium  Result Value Ref Range   SARS Coronavirus 2 by RT PCR NEGATIVE NEGATIVE    Comment: (NOTE) SARS-CoV-2 target nucleic acids are NOT DETECTED.  The SARS-CoV-2 RNA is generally detectable in upper respiratory specimens during the acute phase of infection. The lowest concentration of SARS-CoV-2 viral copies this assay can detect is 138 copies/mL. A negative result does not preclude SARS-Cov-2 infection and should not be used as the sole basis for treatment or other patient management decisions. A negative result may occur with  improper specimen collection/handling, submission of specimen other than nasopharyngeal swab, presence of viral mutation(s) within the areas targeted by this assay, and inadequate number of viral copies(<138 copies/mL). A negative result must be combined with clinical observations, patient history, and epidemiological information. The expected result is Negative.  Fact Sheet for Patients:  EntrepreneurPulse.com.au  Fact Sheet for Healthcare Providers:  IncredibleEmployment.be  This test is no t yet approved or cleared by the [place] FDA and  has been authorized for detection and/or diagnosis of SARS-CoV-2 by FDA under an Emergency Use Authorization (EUA). This EUA will remain  in effect (meaning this test can be used) for the duration of the COVID-19 declaration under Section 564(b)(1) of the Act, 21 U.S.C.section 360bbb-3(b)(1), unless the authorization is terminated  or revoked sooner.       Influenza A by  PCR NEGATIVE NEGATIVE   Influenza B by PCR NEGATIVE NEGATIVE    Comment: (NOTE) The Xpert Xpress SARS-CoV-2/FLU/RSV plus assay is intended as an aid in the diagnosis of influenza from Nasopharyngeal swab specimens and should not be used as a sole basis for treatment. Nasal washings and aspirates are unacceptable for Xpert Xpress SARS-CoV-2/FLU/RSV testing.  Fact Sheet for Patients: EntrepreneurPulse.com.au  Fact Sheet for Healthcare Providers: IncredibleEmployment.be  This test is not yet approved or cleared by the [place] FDA and has been authorized for detection and/or diagnosis of SARS-CoV-2 by FDA under an Emergency Use Authorization (EUA). This EUA will  remain in effect (meaning this test can be used) for the duration of the COVID-19 declaration under Section 564(b)(1) of the Act, 21 U.S.C. section 360bbb-3(b)(1), unless the authorization is terminated or revoked.  Performed at [facility], [address] [place], [place] [address]   Lactic acid, plasma     Status: None   Collection Time: 03/21/21  7:50 PM  Result Value Ref Range   Lactic Acid, Venous 1.5 0.5 - 1.9 mmol/L    Comment: Performed at [facility], [address] [place], [place] [address]  Comprehensive metabolic panel     Status: Abnormal   Collection Time: 03/21/21  7:50 PM  Result Value Ref Range   Sodium 134 (L) 135 - 145 mmol/L   Potassium 3.6 3.5 - 5.1 mmol/L   Chloride 102 98 - 111 mmol/L   CO2 16 (L) 22 - 32 mmol/L   Glucose, Bld 74 70 - 99 mg/dL    Comment: Glucose reference range applies only to samples taken after fasting for at least 8 hours.   BUN 123 (H) 6 - 20 mg/dL    Comment: RESULT CONFIRMED BY MANUAL DILUTION GAA   Creatinine, Ser 1.86 (H) 0.61 - 1.24 mg/dL   Calcium 5.9 (LL) 8.9 - 10.3 mg/dL    Comment: CRITICAL RESULT CALLED TO, READ BACK BY AND VERIFIED WITH [place] LOCKEE AT 2040 ON 03/21/21 BY SS    Total Protein 6.0  (L) 6.5 - 8.1 g/dL   Albumin 1.8 (L) 3.5 - 5.0 g/dL   AST 28 15 - 41 U/L   ALT 21 0 - 44 U/L   Alkaline Phosphatase 69 38 - 126 U/L   Total Bilirubin 1.4 (H) 0.3 - 1.2 mg/dL   GFR, Estimated 45 (L) >60 mL/min    Comment: (NOTE) Calculated using the CKD-EPI Creatinine Equation (2021)    Anion gap 16 (H) 5 - 15    Comment: Performed at [facility], [address] [place], [place] [address]  Protime-INR     Status: Abnormal   Collection Time: 03/21/21  7:50 PM  Result Value Ref Range   Prothrombin Time 18.3 (H) 11.4 - 15.2 seconds   INR 1.5 (H) 0.8 - 1.2    Comment: (NOTE) INR goal varies based on device and disease states. Performed at [facility], [address] [place], [place] [address]   APTT     Status: None   Collection Time: 03/21/21  7:50 PM  Result Value Ref Range   aPTT 29 24 - 36 seconds    Comment: Performed at [facility], [address] [place], [place] [address]  Blood Culture (routine x 2)     Status: None (Preliminary result)   Collection Time: 03/21/21  7:50 PM   Specimen: BLOOD  Result Value Ref Range   Specimen Description BLOOD LHAND    Special Requests      BOTTLES DRAWN AEROBIC ONLY Blood Culture adequate volume   Culture      NO GROWTH < 12 HOURS Performed at [facility], [address], [place],  [address]    Report Status PENDING   Blood Culture (routine x 2)     Status: None (Preliminary result)   Collection Time: 03/21/21  7:50 PM   Specimen: BLOOD  Result Value Ref Range   Specimen Description BLOOD BLOOD LEFT ARM    Special Requests      BOTTLES DRAWN AEROBIC AND ANAEROBIC Blood Culture adequate volume  Culture      NO GROWTH < 12 HOURS Performed at [facility], [address] [place], [place] [address]    Report Status PENDING   Urinalysis, Complete w Microscopic Urine, Suprapubic     Status: Abnormal   Collection Time: 03/21/21  7:50 PM  Result Value Ref Range    Color, Urine AMBER (A) YELLOW    Comment: BIOCHEMICALS MAY BE AFFECTED BY COLOR   APPearance CLOUDY (A) CLEAR   Specific Gravity, Urine 1.018 1.005 - 1.030   pH 7.0 5.0 - 8.0   Glucose, UA NEGATIVE NEGATIVE mg/dL   Hgb urine dipstick NEGATIVE NEGATIVE   Bilirubin Urine NEGATIVE NEGATIVE   Ketones, ur 5 (A) NEGATIVE mg/dL   Protein, ur 100 (A) NEGATIVE mg/dL   Nitrite NEGATIVE NEGATIVE   Leukocytes,Ua LARGE (A) NEGATIVE   RBC / HPF 6-10 0 - 5 RBC/hpf   WBC, UA >50 (H) 0 - 5 WBC/hpf   Bacteria, UA MANY (A) NONE SEEN   Squamous Epithelial / LPF 0-5 0 - 5   Mucus PRESENT     Comment: Performed at [facility], [address] [place], [place] [address]  CK     Status: None   Collection Time: 03/21/21  7:50 PM  Result Value Ref Range   Total CK 259 49 - 397 U/L    Comment: Performed at [facility], [address] [place], [place] [address]  Sedimentation rate     Status: Abnormal   Collection Time: 03/21/21  7:50 PM  Result Value Ref Range   Sed Rate 63 (H) 0 - 15 mm/hr    Comment: Performed at [facility], [address] [place], [place] [address]  C-reactive protein     Status: Abnormal   Collection Time: 03/21/21  7:50 PM  Result Value Ref Range   CRP 13.1 (H) <1.0 mg/dL    Comment: Performed at [facility], [address] [place], [place] [address]  Lactic acid, plasma     Status: None   Collection Time: 03/21/21  8:49 PM  Result Value Ref Range   Lactic Acid, Venous 1.0 0.5 - 1.9 mmol/L    Comment: Performed at [facility], [address] [place], [place] [address]  Prepare RBC (crossmatch)     Status: None   Collection Time: 03/21/21  9:47 PM  Result Value Ref Range   Order Confirmation      ORDER PROCESSED BY BLOOD BANK Performed at [facility], [address] [place], [place] [address]   Type and screen Walker     Status: None   Collection Time: 03/21/21  9:53 PM  Result Value  Ref Range   ABO/RH(D) O POS    Antibody Screen NEG    Sample Expiration 03/24/2021,2359    Unit Number [id number]    Blood Component Type RED CELLS,LR    Unit division 00    Status of Unit ISSUED,FINAL    Transfusion Status OK TO TRANSFUSE    Crossmatch Result      Compatible Performed at [facility], [address] [place], [place] [address]   Hemoglobin and hematocrit, blood     Status: Abnormal   Collection Time: 03/21/21  9:53 PM  Result Value Ref Range   Hemoglobin 6.3 (L) 13.0 - 17.0 g/dL   HCT 20.0 (L) 39.0 - 52.0 %    Comment: Performed at [facility], [address],  [place], [place] [id number]  Glucose, capillary     Status: None   Collection Time: 03/22/21  3:21 AM  Result Value Ref Range   Glucose-Capillary 76 70 - 99 mg/dL    Comment: Glucose reference range applies only to samples taken after fasting for at least 8 hours.  Procalcitonin     Status: None   Collection Time: 03/22/21  4:14 AM  Result Value Ref Range   Procalcitonin 36.94 ng/mL    Comment:        Interpretation: PCT >= 10 ng/mL: Important systemic inflammatory response, almost exclusively due to severe bacterial sepsis or septic shock. (NOTE)       Sepsis PCT Algorithm           Lower Respiratory Tract                                      Infection PCT Algorithm    ----------------------------     ----------------------------         PCT < 0.25 ng/mL                PCT < 0.10 ng/mL          Strongly encourage             Strongly discourage   discontinuation of antibiotics    initiation of antibiotics    ----------------------------     -----------------------------       PCT 0.25 - 0.50 ng/mL            PCT 0.10 - 0.25 ng/mL               OR       >80% decrease in PCT            Discourage initiation of                                            antibiotics      Encourage discontinuation           of antibiotics    ----------------------------      -----------------------------         PCT >= 0.50 ng/mL              PCT 0.26 - 0.50 ng/mL                AND       <80% decrease in PCT             Encourage initiation of                                             antibiotics       Encourage continuation           of antibiotics    ----------------------------     -----------------------------        PCT >= 0.50 ng/mL                  PCT > 0.50 ng/mL               AND         increase in PCT  Strongly encourage                                      initiation of antibiotics    Strongly encourage escalation           of antibiotics                                     -----------------------------                                           PCT <= 0.25 ng/mL                                                 OR                                        > 80% decrease in PCT                                      Discontinue / Do not initiate                                             antibiotics  Performed at [facility], [address] [place], [place] [address]   CBC     Status: Abnormal   Collection Time: 03/22/21  4:14 AM  Result Value Ref Range   WBC 9.9 4.0 - 10.5 K/uL   RBC 4.49 4.22 - 5.81 MIL/uL   Hemoglobin 9.6 (L) 13.0 - 17.0 g/dL    Comment: REPEATED TO VERIFY   HCT 30.5 (L) 39.0 - 52.0 %   MCV 67.9 (L) 80.0 - 100.0 fL   MCH 21.4 (L) 26.0 - 34.0 pg   MCHC 31.5 30.0 - 36.0 g/dL   RDW 24.1 (H) 11.5 - 15.5 %   Platelets 97 (L) 150 - 400 K/uL    Comment: Immature Platelet Fraction may be clinically indicated, consider ordering this additional test [id number] CONSISTENT WITH PREVIOUS RESULT    nRBC 0.0 0.0 - 0.2 %    Comment: Performed at [facility], [address] [place],  [id number]  Basic metabolic panel     Status: Abnormal   Collection Time: 03/22/21  4:14 AM  Result Value Ref Range   Sodium 141 135 - 145 mmol/L    Comment: RESULTS VERIFIED BY REPEAT TESTING GAA    Potassium 3.5 3.5 - 5.1 mmol/L   Chloride 107 98 - 111 mmol/L   CO2 20 (L) 22 - 32 mmol/L   Glucose, Bld 89 70 - 99 mg/dL    Comment: Glucose reference range applies only to samples taken after fasting for at least 8 hours.   BUN 82 (H) 6 - 20 mg/dL   Creatinine, Ser 0.99 0.61 -  1.24 mg/dL   Calcium 7.3 (L) 8.9 - 10.3 mg/dL   GFR, Estimated >55 >73 mL/min    Comment: (NOTE) Calculated using the CKD-EPI Creatinine Equation (2021)    Anion gap 14 5 - 15    Comment: Performed at [facility], [address] [place], [place] [address]  CK     Status: None   Collection Time: 03/22/21  4:14 AM  Result Value Ref Range   Total CK 161 49 - 397 U/L    Comment: Performed at [facility], [address] [place], [place] [address]  Vancomycin, random     Status: None   Collection Time: 03/22/21  4:14 AM  Result Value Ref Range   Vancomycin Rm 15     Comment:        Random Vancomycin therapeutic range is dependent on dosage and time of specimen collection. A peak range is 20.0-40.0 ug/mL A trough range is 5.0-15.0 ug/mL        Performed at [facility], [address] [place], [place] [address]     Current Facility-Administered Medications  Medication Dose Route Frequency Provider Last Rate Last Admin   Chlorhexidine Gluconate Cloth 2 % PADS 6 each  6 each Topical Daily Vida Rigger, MD   6 each at 03/22/21 1201   [START ON 03/26/2021] collagenase (SANTYL) ointment   Topical BID Shaune Pollack, MD       docusate sodium (COLACE) capsule 100 mg  100 mg Oral BID PRN Judithe Modest, NP       HYDROcodone-acetaminophen (NORCO/VICODIN) 5-325 MG per tablet 1-2 tablet  1-2 tablet Oral Q6H PRN Judithe Modest, NP       lactated ringers infusion   Intravenous Continuous Harlon Ditty D, NP 100 mL/hr at 03/22/21 1700 Infusion Verify at 03/22/21 1700   morphine 2 MG/ML injection 2 mg  2 mg Intravenous Q4H PRN Harlon Ditty D, NP   2 mg at 03/22/21 0118    piperacillin-tazobactam (ZOSYN) IVPB 3.375 g  3.375 g Intravenous Q8H Tressie Ellis, RPH 12.5 mL/hr at 03/22/21 1700 Infusion Verify at 03/22/21 1700   polyethylene glycol (MIRALAX / GLYCOLAX) packet 17 g  17 g Oral Daily PRN Judithe Modest, NP       sodium hypochlorite (DAKIN'S 1/4 STRENGTH) topical solution   Irrigation BID Shaune Pollack, MD   Given at 03/22/21 0948   vancomycin (VANCOREADY) IVPB 1250 mg/250 mL  1,250 mg Intravenous Q24H Paschal Dopp S, RPH       vancomycin variable dose per unstable renal function (pharmacist dosing)   Does not apply See admin instructions Tressie Ellis, [facility]        Musculoskeletal: Strength & Muscle Tone: decreased Gait & Station:  patient has no motor function of BLE  Patient leans: N/A  Psychiatric Specialty Exam:  Presentation  General Appearance: Appropriate for Environment Eye Contact:Good Speech:Clear and Coherent Speech Volume:Decreased Handedness:No data recorded  Mood and Affect  Mood:-- (sleepy) Affect:Congruent; Appropriate  Thought Process  Thought Processes:Coherent Descriptions of Associations:No data recorded Orientation:Full (Time, Place and Person) Thought Content:WDL History of Schizophrenia/Schizoaffective disorder:No data recorded Duration of Psychotic Symptoms:No data recorded Hallucinations:Hallucinations: None Ideas of Reference:None Suicidal Thoughts:Suicidal Thoughts: No Homicidal Thoughts:Homicidal Thoughts: No  Sensorium  Memory:Immediate Good Judgment:Good Insight:Good  Executive Functions  Concentration:Fair Attention Span:Fair Recall:Good Fund of Knowledge:Good Language:Good  Psychomotor Activity  Psychomotor Activity:Psychomotor Activity: Normal  Assets  Assets:Resilience; Housing  Sleep  Sleep:Sleep: Good  Physical Exam: Physical Exam  Vitals and nursing note reviewed.  Constitutional:      Comments: sleepy  HENT:     Head: Normocephalic.     Nose: No congestion or  rhinorrhea.  Eyes:     General:        Right eye: No discharge.        Left eye: No discharge.  Pulmonary:     Effort: Pulmonary effort is normal.  Musculoskeletal:     Cervical back: Normal range of motion.     Comments: No functional mobility of BLE  Skin:    General: Skin is dry.  Neurological:     Mental Status: He is alert and oriented to person, place, and time.  Psychiatric:        Attention and Perception: Attention normal.        Mood and Affect: Mood normal.        Speech: Speech normal.        Behavior: Behavior normal.        Thought Content: Thought content normal.        Judgment: Judgment normal.     Comments: Hx of anxiety and depression in chart. Currently denies   Review of Systems  Reason unable to perform ROS: patient on medical unit being treated for sepsis.  Constitutional: Negative.   HENT: Negative.    Eyes: Negative.   Respiratory: Negative.    Cardiovascular: Negative.   Gastrointestinal: Negative.   Genitourinary:        Neurogenic bladder   Musculoskeletal:  Positive for falls.       No functional mobility in BLE  Skin:        Decubitus ulcer, sacrum and buttocks  Neurological:  Positive for weakness.  Psychiatric/Behavioral:  Negative for depression, hallucinations, memory loss, substance abuse and suicidal ideas. The patient is not nervous/anxious and does not have insomnia.        Pt currently denies psych symptoms  Blood pressure 90/61, pulse 93, temperature 98.6 F (37 C), temperature source Oral, resp. rate 17, height 5\' 10"  (1.778 m), weight 40.3 kg, SpO2 100 %. Body mass index is 12.75 kg/m.  Treatment Plan Summary: Daily contact with patient to assess and evaluate symptoms and progress in treatment. Psychiatry should follow for any subsequent needs that may arise as patient continues to recover.   Disposition: No evidence of imminent risk to self or others at present.    Sherlon Handing, NP 03/22/2021 5:33 PM

## 2021-03-22 NOTE — Anesthesia Preprocedure Evaluation (Addendum)
Anesthesia Evaluation  Patient identified by MRN, date of birth, ID band Patient awake    Reviewed: Allergy & Precautions, NPO status , Patient's Chart, lab work & pertinent test results  History of Anesthesia Complications Negative for: history of anesthetic complications  Airway Mallampati: III  TM Distance: >3 FB Neck ROM: full    Dental  (+) Chipped, Poor Dentition   Pulmonary neg shortness of breath, former smoker,    Pulmonary exam normal        Cardiovascular Exercise Tolerance: Good (-) angina(-) Past MI Normal cardiovascular exam     Neuro/Psych PSYCHIATRIC DISORDERS Anxiety Depression Paraplegic Neurogenic bladder    GI/Hepatic Neg liver ROS, neg GERD  ,Severe malnourishment 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   Endo/Other  negative endocrine ROS  Renal/GU Renal disease (AKI)     Musculoskeletal   Abdominal   Peds  Hematology  (+) Blood dyscrasia, anemia , Anemia - Hgb 8.2 g/dL Thrombocytopenia - platelets 80K   Anesthesia Other Findings Inpatient - sepsis with positive blood cxs (GPC's) Infected sacral decubitus ulcers unlikely to heal Poor prognosis  CV: Borderline hypotension/tachycardia - no pressors currently Resp: SpO2 98% RA LDA: PIV x 2  Past Medical History: No date: Anxiety No date: Decubitus ulcer No date: Depression No date: Neurogenic bladder No date: Osteomyelitis (HCC) 02/21/00: Paraparesis of both lower limbs [facility])  Past Surgical History: 03/19/2018: AMPUTATION; Right     Comment:  Procedure: 5th Metatarsal Resection;  Surgeon: Recardo Evangelist, DPM;  Location: [facility] ORS;  Service: Podiatry;                Laterality: Right; 04/29/2015: AMPUTATION TOE; Right     Comment:  Procedure: AMPUTATION TOE;  Surgeon: Linus Galas, MD;                Location: [facility] ORS;  Service: Podiatry;  Laterality:               Right; No date: IRRIGATION AND  DEBRIDEMENT BUTTOCKS No date: TOE AMPUTATION; Right  BMI    Body Mass Index: 12.75 kg/m      Reproductive/Obstetrics negative OB ROS                          Anesthesia Physical Anesthesia Plan  ASA: 4  Anesthesia Plan: General ETT   Post-op Pain Management:    Induction: Intravenous  PONV Risk Score and Plan: Ondansetron, Midazolam and Treatment may vary due to age or medical condition  Airway Management Planned: Oral ETT  Additional Equipment:   Intra-op Plan:   Post-operative Plan: Extubation in OR  Informed Consent: I have reviewed the patients History and Physical, chart, labs and discussed the procedure including the risks, benefits and alternatives for the proposed anesthesia with the patient or authorized representative who has indicated his/her understanding and acceptance.     Dental Advisory Given  Plan Discussed with: Anesthesiologist, CRNA and Surgeon  Anesthesia Plan Comments: (Patient consented for risks of anesthesia including but not limited to:  - adverse reactions to medications - damage to eyes, teeth, lips or other oral mucosa - nerve damage due to positioning  - sore throat or hoarseness - Damage to heart, brain, nerves, lungs, other parts of body or loss of life  Patient voiced understanding.)       Anesthesia Quick Evaluation

## 2021-03-22 NOTE — Progress Notes (Signed)
PHARMACY - PHYSICIAN COMMUNICATION CRITICAL VALUE ALERT - BLOOD CULTURE IDENTIFICATION (BCID)  Jonathon York is an 45 y.o. male who presented to [facility] on 03/21/2021 with a chief complaint of sepsis.   Assessment:  Strep species in 1 of 4 bottles (aerobic), no resistance detected.  (include suspected source if known)  Name of physician (or Provider) Contacted: E Ouma, Np   Current antibiotics: Vanc, Zosyn   Changes to prescribed antibiotics recommended:  Recommendations declined by provider due to pt going to OR for debridement.  May considering narrowing afterwards.   Results for orders placed or performed during the hospital encounter of 03/21/21  Blood Culture ID Panel (Reflexed) (Collected: 03/21/2021  7:50 PM)  Result Value Ref Range   Enterococcus faecalis NOT DETECTED NOT DETECTED   Enterococcus Faecium NOT DETECTED NOT DETECTED   Listeria monocytogenes NOT DETECTED NOT DETECTED   Staphylococcus species NOT DETECTED NOT DETECTED   Staphylococcus aureus (BCID) NOT DETECTED NOT DETECTED   Staphylococcus epidermidis NOT DETECTED NOT DETECTED   Staphylococcus lugdunensis NOT DETECTED NOT DETECTED   Streptococcus species DETECTED (A) NOT DETECTED   Streptococcus agalactiae NOT DETECTED NOT DETECTED   Streptococcus pneumoniae NOT DETECTED NOT DETECTED   Streptococcus pyogenes NOT DETECTED NOT DETECTED   A.calcoaceticus-baumannii NOT DETECTED NOT DETECTED   Bacteroides fragilis NOT DETECTED NOT DETECTED   Enterobacterales NOT DETECTED NOT DETECTED   Enterobacter cloacae complex NOT DETECTED NOT DETECTED   Escherichia coli NOT DETECTED NOT DETECTED   Klebsiella aerogenes NOT DETECTED NOT DETECTED   Klebsiella oxytoca NOT DETECTED NOT DETECTED   Klebsiella pneumoniae NOT DETECTED NOT DETECTED   Proteus species NOT DETECTED NOT DETECTED   Salmonella species NOT DETECTED NOT DETECTED   Serratia marcescens NOT DETECTED NOT DETECTED   Haemophilus influenzae NOT DETECTED NOT  DETECTED   Neisseria meningitidis NOT DETECTED NOT DETECTED   Pseudomonas aeruginosa NOT DETECTED NOT DETECTED   Stenotrophomonas maltophilia NOT DETECTED NOT DETECTED   Candida albicans NOT DETECTED NOT DETECTED   Candida auris NOT DETECTED NOT DETECTED   Candida glabrata NOT DETECTED NOT DETECTED   Candida krusei NOT DETECTED NOT DETECTED   Candida parapsilosis NOT DETECTED NOT DETECTED   Candida tropicalis NOT DETECTED NOT DETECTED   Cryptococcus neoformans/gattii NOT DETECTED NOT DETECTED    [name] 03/22/2021  11:02 PM

## 2021-03-22 NOTE — Progress Notes (Signed)
eLink Physician-Brief Progress Note Patient Name: Jonathon York DOB: [birth year] MRN: [id number]   Date of Service  03/22/2021  HPI/Events of Note  45 year old man admitted to ICU with sepsis due to UTI and wound infection. Seen by bedside CCM team. No distress on camera, MAP is 75 at this time.   eICU Interventions  Continue Antibiotics, blood transfusion support, serial labs and replete electrolytes as needed. Surgical team consulted. Monitor post transfusion hemoglobin. Call E link if needed.      Intervention Category Major Interventions: Sepsis - evaluation and management Evaluation Type: New Patient Evaluation  Oretha Milch 03/22/2021, 3:24 AM

## 2021-03-22 NOTE — Consult Note (Signed)
Pharmacy Antibiotic Note  Jonathon York is a 45 y.o. male with medical history including paraplegia, neurogenic bladder, decubitus ulcers / chronic sacral osteomyelitis admitted on 03/21/2021 with  found down at home after four days . There is concern for sepsis. Pharmacy has been consulted for vancomycin and Zosyn dosing.  Plan:  Zosyn 3.375 g IV q8h (4-hr extended infusion)  Vancomycin 1 g IV LD. Will start vancomycin 1250 mg q24H. Predicted AUC of 505. Goal AUC of 400-550. Scr trending down. Obtain vancomycin level prior to the 4th-5th dose.   Height: 5\' 10"  (177.8 cm) Weight: 40.3 kg (88 lb 13.5 oz) IBW/kg (Calculated) : 73  Temp (24hrs), Avg:98 F (36.7 C), Min:94.7 F (34.8 C), Max:98.6 F (37 C)  Recent Labs  Lab 03/21/21 1950 03/21/21 2049 03/22/21 0414  WBC 10.4  --  9.9  CREATININE 1.86*  --  0.99  LATICACIDVEN 1.5 1.0  --   VANCORANDOM  --   --  15     Estimated Creatinine Clearance: 53.7 mL/min (by C-G formula based on SCr of 0.99 mg/dL).    Allergies  Allergen Reactions   Orange Fruit [Citrus] Other (See Comments)    Blisters only from [place]!!! Patient is not allergic from all citrus   Sulfa Antibiotics     Throwing up blood     Antimicrobials this admission: Cefepime 11/23 x 1 Metronidazole 11/23 x 1 Vancomycin 11/23 >>  Zosyn 11/24 >>   Dose adjustments this admission: N/A  Microbiology results: 11/23 BCx: pending 11/23 UCx: pending   Thank you for allowing pharmacy to be a part of this patient's care.  12/23 03/22/2021 11:59 AM

## 2021-03-22 NOTE — Progress Notes (Signed)
NAME:  Jonathon York, MRN:  [id number], DOB:  [birth year], LOS: 1 ADMISSION DATE:  03/21/2021, CONSULTATION DATE:  03/21/2021 REFERRING MD:  Dr. Erma Heritage, CHIEF COMPLAINT:  Fall   Brief Pt Description / Synopsis:  45 y.o. Male with PMH of paraplegia of lower extremities, presented after falling with transfer, found after 4 days.  Admitted with Severe Sepsis due to UTI and infected decubitus wounds.  History of Present Illness:  Jonathon York is a 45 year old male with a history of paraplegia of the lower extremities, chronic sacral decubitus ulcers, neurogenic bladder status post suprapubic catheter who presented to [facility] ED on 03/21/2021 after being found down in the floor at home.    The patient reports that he fell 4 days ago when transitioning himself in his house, and he has since remained in the floor, unable to get himself up. He denies any head trauma or LOC from the fall.  His daughter normally checks on him but was away for a few days, and when she checked on him today he was found in the floor.  He was found grossly soiled with dried fecal matter on his back and wounds.  He reports chills, fatigue, weakness, and aches.  He also reports that has been greater than 7 days since he last ate.  He denies any head trauma or loss of consciousness from the fall.  03/22/21- patient is stable on room air. He is not on pressors. S/p Surgery evaluation.  Palliative care consultation ordered due to poor prognosis with large wounds unlikely to heal. Psychiatry evaluation due to clinical signs of depression.    Pertinent  Medical History  Paraplegia of bilateral lower extremities Neurogenic bladder requiring Suprapubic catheter Osteomyelitis Decubitus Ulcers Anxiety Depression  Micro Data:  03/21/2021: SARS-CoV-2 & Influenza PCR>>negative 03/21/2021: Blood culture x2>> 03/21/2021: Urine >>  Antimicrobials:  Flagyl 11/23 x1 dose Cefepime 11/23 x1 dose Vancomycin 11/23>> Zosyn  11/23>>   Objective   Blood pressure 94/68, pulse 95, temperature 98.6 F (37 C), temperature source Oral, resp. rate (!) 22, height 5\' 10"  (1.778 m), weight 40.3 kg, SpO2 100 %.        Intake/Output Summary (Last 24 hours) at 03/22/2021 1142 Last data filed at 03/22/2021 1120 Gross per 24 hour  Intake 4318 ml  Output 2275 ml  Net 2043 ml    Filed Weights   03/21/21 1825 03/22/21 0500  Weight: 54.4 kg 40.3 kg    Examination: General: Cachectic, chronically ill appearing male, laying in bed, grossly contaminated with fecal matter dried onto back and legs, in NAD HENT: Atraumatic, normocephalic, neck supple, no JVD.  Dry mucus membranes Lungs: Clear breath sounds throughout, mild tachypnea, even Cardiovascular: Tachycardia, regular rhythm, s1s2, No M/R/G Abdomen: Soft, nontender, nondistended, no guarding or rebound tenderness, BS+ x4 Extremities: paraplegia of bilateral LE (baseline) Neuro: Awake and alert, oriented to person, place, time GU: Suprapubic catheter in place draining cloudy urine Skin: Grossly contaminated, open, draining decubitus ulcers with dried fecal matter.  See images below.     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    - psychiatry evaluation  for additional input - very tough case   Tachycardia due to sepsis & Dehydration -Continuous cardiac monitoring -Maintain MAP >65 -IV fluids (received 30 cc IVF resuscitation in ED) -Vasopressors as needed to maintain MAP goal -Trend lactic acid until normalized  (1.5 ~ 1.0) -HS Troponin normal (13)  Acute Kidney Injury Hypocalcemia  Mild Hyponatremia Anion gap metabolic acidosis -Monitor I&O's / urinary output -Follow BMP -Ensure adequate renal perfusion -Avoid nephrotoxic agents as able -Replace electrolytes as indicated -Pharmacy consulted for assistance with electrolyte replacement -Received 2g of Calcium gluconate in ED -IV fluids -Check CK ~ 259 -Follow lactic acid  Anemia without overt s/sx of bleeding Thrombocytopenia, suspect due to sepsis -Monitor for S/Sx of bleeding -Trend CBC -SCD's for VTE Prophylaxis (will hold chemical prophylaxis for now given anemia) -Transfuse for Hgb <7 -To receive 1 unit pRBC's 11/23    Best Practice (right click and "Reselect all SmartList Selections" daily)   Diet/type: Regular consistency (see orders) DVT prophylaxis: SCD GI prophylaxis: N/A Lines: N/A Foley:  N/A, Chronic suprapubic catheter Code Status:  full code Last date of multidisciplinary goals of care discussion [N/A]  Labs   CBC: Recent Labs  Lab 03/21/21 1950 03/21/21 2153 03/22/21 0414  WBC 10.4  --  9.9  NEUTROABS 9.1*  --   --   HGB 7.1* 6.3* 9.6*  HCT 22.2* 20.0* 30.5*  MCV 63.6*  --  67.9*  PLT 104*  --  97*     Basic Metabolic Panel: Recent Labs  Lab 03/21/21 1950 03/22/21 0414  NA 134* 141  K 3.6 3.5  CL 102 107  CO2 16* 20*  GLUCOSE 74 89  BUN 123* 82*  CREATININE 1.86* 0.99  CALCIUM 5.9* 7.3*    GFR: Estimated Creatinine Clearance: 53.7 mL/min (by C-G formula based on SCr of 0.99 mg/dL). Recent Labs  Lab 03/21/21 1834 03/21/21 1950 03/21/21 2049 03/22/21 0414  PROCALCITON 85.62  --   --  36.94  WBC  --  10.4  --  9.9  LATICACIDVEN  --  1.5 1.0  --      Liver Function Tests: Recent Labs  Lab 03/21/21 1950  AST 28  ALT 21  ALKPHOS 69  BILITOT 1.4*  PROT 6.0*  ALBUMIN 1.8*    No results for input(s): LIPASE, AMYLASE in the last 168 hours. No results for input(s): AMMONIA in  the last 168 hours.  ABG No results found for: PHART, PCO2ART, PO2ART, HCO3, TCO2, ACIDBASEDEF, O2SAT   Coagulation Profile: Recent Labs  Lab 03/21/21 1950  INR 1.5*     Cardiac Enzymes: Recent Labs  Lab 03/21/21 1950 03/22/21 0414  CKTOTAL 259 161     HbA1C: No results found for: HGBA1C  CBG: Recent Labs  Lab 03/22/21 0321  GLUCAP 76    Review of Systems:   Positives in BOLD: Gen: Denies fever, chills, weight change, fatigue, night sweats HEENT: Denies blurred vision, double vision, hearing loss, tinnitus, sinus congestion, rhinorrhea, sore throat, neck stiffness, dysphagia PULM: Denies shortness of breath, cough, sputum production, hemoptysis, wheezing CV: Denies chest pain, edema, orthopnea, paroxysmal nocturnal dyspnea, palpitations GI: Denies abdominal pain, nausea, vomiting, diarrhea, hematochezia, melena, constipation, change in bowel habits GU: Denies dysuria, hematuria, polyuria, oliguria, urethral discharge Endocrine: Denies hot or cold intolerance, polyuria, polyphagia or appetite change Derm: Denies rash, dry skin, scaling or peeling skin change Heme: Denies easy bruising, bleeding, bleeding gums Neuro: Denies headache, numbness, weakness, slurred speech, loss of memory or consciousness   Past Medical  History:  He,  has a past medical history of Anxiety, Decubitus ulcer, Depression, Neurogenic bladder, Osteomyelitis ([place]), and Paraparesis of both lower limbs ([place]) (02/21/00).   Surgical History:   Past Surgical History:  Procedure Laterality Date   AMPUTATION Right 03/19/2018   Procedure: 5th Metatarsal Resection;  Surgeon: Albertine Patricia, DPM;  Location: [facility] ORS;  Service: Podiatry;  Laterality: Right;   AMPUTATION TOE Right 04/29/2015   Procedure: AMPUTATION TOE;  Surgeon: Sharlotte Alamo, MD;  Location: [facility] ORS;  Service: Podiatry;  Laterality: Right;   IRRIGATION AND DEBRIDEMENT BUTTOCKS     TOE AMPUTATION Right      Social History:    reports that he quit smoking about 4 years ago. His smoking use included cigarettes. He smoked an average of .5 packs per day. He has never used smokeless tobacco. He reports current drug use. Drug: Marijuana. He reports that he does not drink alcohol.   Family History:  His family history includes Lymphoma in his sister.   Allergies Allergies  Allergen Reactions   Orange Fruit [Citrus] Other (See Comments)    Blisters only from [place]!!! Patient is not allergic from all citrus   Sulfa Antibiotics     Throwing up blood      Home Medications  Prior to Admission medications   Medication Sig Start Date End Date Taking? Authorizing Provider  sodium hypochlorite (DAKIN'S 1/4 STRENGTH) 0.125 % SOLN For 7 days Moisten gauze with 1/4% Dakins and pack right ischial wound Patient not taking: Reported on 03/21/2021 02/12/21   Samuella Cota, MD     Critical care provider statement:   Total critical care time: 33 minutes   Performed by: Lanney Gins MD   Critical care time was exclusive of separately billable procedures and treating other patients.   Critical care was necessary to treat or prevent imminent or life-threatening deterioration.   Critical care was time spent personally by me on the following activities: development of treatment plan with patient and/or surrogate as well as nursing, discussions with consultants, evaluation of patient's response to treatment, examination of patient, obtaining history from patient or surrogate, ordering and performing treatments and interventions, ordering and review of laboratory studies, ordering and review of radiographic studies, pulse oximetry and re-evaluation of patient's condition.    Ottie Glazier, M.D.  Pulmonary & Critical Care Medicine

## 2021-03-23 LAB — CBC
HCT: 27.7 % — ABNORMAL LOW (ref 39.0–52.0)
Hemoglobin: 8.6 g/dL — ABNORMAL LOW (ref 13.0–17.0)
MCH: 21.3 pg — ABNORMAL LOW (ref 26.0–34.0)
MCHC: 31 g/dL (ref 30.0–36.0)
MCV: 68.6 fL — ABNORMAL LOW (ref 80.0–100.0)
Platelets: 80 10*3/uL — ABNORMAL LOW (ref 150–400)
RBC: 4.04 MIL/uL — ABNORMAL LOW (ref 4.22–5.81)
RDW: 23.5 % — ABNORMAL HIGH (ref 11.5–15.5)
WBC: 7.8 10*3/uL (ref 4.0–10.5)
nRBC: 0 % (ref 0.0–0.2)

## 2021-03-23 LAB — PROTIME-INR
INR: 1.4 — ABNORMAL HIGH (ref 0.8–1.2)
Prothrombin Time: 17.1 seconds — ABNORMAL HIGH (ref 11.4–15.2)

## 2021-03-23 LAB — COMPREHENSIVE METABOLIC PANEL
ALT: 20 U/L (ref 0–44)
AST: 38 U/L (ref 15–41)
Albumin: 1.7 g/dL — ABNORMAL LOW (ref 3.5–5.0)
Alkaline Phosphatase: 60 U/L (ref 38–126)
Anion gap: 12 (ref 5–15)
BUN: 17 mg/dL (ref 6–20)
CO2: 24 mmol/L (ref 22–32)
Calcium: 7.1 mg/dL — ABNORMAL LOW (ref 8.9–10.3)
Chloride: 104 mmol/L (ref 98–111)
Creatinine, Ser: 0.63 mg/dL (ref 0.61–1.24)
GFR, Estimated: >60 mL/min (ref >60)
Glucose, Bld: 113 mg/dL — ABNORMAL HIGH (ref 70–99)
Potassium: 3.3 mmol/L — ABNORMAL LOW (ref 3.5–5.1)
Sodium: 140 mmol/L (ref 135–145)
Total Bilirubin: 0.8 mg/dL (ref 0.3–1.2)
Total Protein: 5.5 g/dL — ABNORMAL LOW (ref 6.5–8.1)

## 2021-03-23 LAB — BASIC METABOLIC PANEL
Anion gap: 8 (ref 5–15)
BUN: 19 mg/dL (ref 6–20)
CO2: 28 mmol/L (ref 22–32)
Calcium: 7 mg/dL — ABNORMAL LOW (ref 8.9–10.3)
Chloride: 105 mmol/L (ref 98–111)
Creatinine, Ser: 0.45 mg/dL — ABNORMAL LOW (ref 0.61–1.24)
GFR, Estimated: >60 mL/min (ref >60)
Glucose, Bld: 97 mg/dL (ref 70–99)
Potassium: 2.3 mmol/L — CL (ref 3.5–5.1)
Sodium: 141 mmol/L (ref 135–145)

## 2021-03-23 LAB — PHOSPHORUS: Phosphorus: 2.5 mg/dL (ref 2.5–4.6)

## 2021-03-23 LAB — POTASSIUM
Potassium: 2.5 mmol/L — CL (ref 3.5–5.1)
Potassium: 3 mmol/L — ABNORMAL LOW (ref 3.5–5.1)

## 2021-03-23 LAB — PROCALCITONIN: Procalcitonin: 9.21 ng/mL

## 2021-03-23 LAB — MAGNESIUM: Magnesium: 2.3 mg/dL (ref 1.7–2.4)

## 2021-03-23 MED ORDER — ASCORBIC ACID 500 MG PO TABS
500.0000 mg | ORAL_TABLET | Freq: Two times a day (BID) | ORAL | Status: DC
  Administered 2021-03-23 – 2021-04-02 (×20): 500 mg via ORAL
  Filled 2021-03-23 (×20): qty 1

## 2021-03-23 MED ORDER — OCUVITE-LUTEIN PO CAPS
1.0000 | ORAL_CAPSULE | Freq: Every day | ORAL | Status: DC
  Administered 2021-03-24 – 2021-04-02 (×9): 1 via ORAL
  Filled 2021-03-23 (×10): qty 1

## 2021-03-23 MED ORDER — POTASSIUM CHLORIDE 10 MEQ/100ML IV SOLN
10.0000 meq | INTRAVENOUS | Status: AC
  Administered 2021-03-23 (×4): 10 meq via INTRAVENOUS
  Filled 2021-03-23 (×4): qty 100

## 2021-03-23 MED ORDER — POTASSIUM CHLORIDE CRYS ER 20 MEQ PO TBCR
40.0000 meq | EXTENDED_RELEASE_TABLET | Freq: Once | ORAL | Status: AC
  Administered 2021-03-23: 40 meq via ORAL
  Filled 2021-03-23: qty 2

## 2021-03-23 MED ORDER — POTASSIUM CHLORIDE 10 MEQ/100ML IV SOLN
10.0000 meq | INTRAVENOUS | Status: AC
  Administered 2021-03-23 – 2021-03-24 (×4): 10 meq via INTRAVENOUS
  Filled 2021-03-23 (×4): qty 100

## 2021-03-23 MED ORDER — K PHOS MONO-SOD PHOS DI & MONO 155-852-130 MG PO TABS
250.0000 mg | ORAL_TABLET | Freq: Three times a day (TID) | ORAL | Status: DC
  Filled 2021-03-23: qty 1

## 2021-03-23 MED ORDER — NOREPINEPHRINE 4 MG/250ML-% IV SOLN
2.0000 ug/min | INTRAVENOUS | Status: DC
  Administered 2021-03-23: 23:00:00 2 ug/min via INTRAVENOUS
  Administered 2021-03-24 (×2): 7 ug/min via INTRAVENOUS
  Administered 2021-03-25: 12:00:00 5 ug/min via INTRAVENOUS
  Filled 2021-03-23 (×4): qty 250

## 2021-03-23 MED ORDER — LACTATED RINGERS IV BOLUS: 500.0000 mL | Freq: Once | INTRAVENOUS | Status: AC

## 2021-03-23 MED ORDER — ALBUMIN HUMAN 25 % IV SOLN
25.0000 g | Freq: Four times a day (QID) | INTRAVENOUS | Status: AC
  Administered 2021-03-23 – 2021-03-24 (×4): 25 g via INTRAVENOUS
  Filled 2021-03-23 (×3): qty 100

## 2021-03-23 MED ORDER — SODIUM CHLORIDE 0.9 % IV SOLN
250.0000 mL | INTRAVENOUS | Status: DC
  Administered 2021-03-30: 250 mL via INTRAVENOUS

## 2021-03-23 MED ORDER — MAGNESIUM SULFATE 2 GM/50ML IV SOLN
2.0000 g | Freq: Once | INTRAVENOUS | Status: AC
  Administered 2021-03-23: 2 g via INTRAVENOUS
  Filled 2021-03-23: qty 50

## 2021-03-23 MED ORDER — ENSURE ENLIVE PO LIQD
237.0000 mL | Freq: Three times a day (TID) | ORAL | Status: DC
  Administered 2021-03-23 – 2021-04-02 (×14): 237 mL via ORAL

## 2021-03-23 MED ORDER — K PHOS MONO-SOD PHOS DI & MONO 155-852-130 MG PO TABS
500.0000 mg | ORAL_TABLET | Freq: Three times a day (TID) | ORAL | Status: AC
  Administered 2021-03-23 – 2021-03-25 (×6): 500 mg via ORAL
  Filled 2021-03-23 (×8): qty 2

## 2021-03-23 MED ORDER — POTASSIUM CHLORIDE 10 MEQ/100ML IV SOLN
10.0000 meq | INTRAVENOUS | Status: AC
  Administered 2021-03-23 (×2): 10 meq via INTRAVENOUS
  Filled 2021-03-23 (×2): qty 100

## 2021-03-23 MED ORDER — LACTATED RINGERS IV BOLUS
500.0000 mL | Freq: Once | INTRAVENOUS | Status: AC
  Administered 2021-03-23: 500 mL via INTRAVENOUS

## 2021-03-23 NOTE — Progress Notes (Signed)
NAME:  Jonathon York, MRN:  [id number], DOB:  [birth year], LOS: 2 ADMISSION DATE:  03/21/2021, CONSULTATION DATE:  03/21/2021 REFERRING MD:  Dr. Ellender Hose, CHIEF COMPLAINT:  Fall   Brief Pt Description / Synopsis:  45 y.o. Male with PMH of paraplegia of lower extremities, presented after falling with transfer, found after 4 days.  Admitted with Severe Sepsis due to UTI and infected decubitus wounds.  History of Present Illness:  Jonathon York is a 45 year old male with a history of paraplegia of the lower extremities, chronic sacral decubitus ulcers, neurogenic bladder status post suprapubic catheter who presented to [facility] ED on 03/21/2021 after being found down in the floor at home.    The patient reports that he fell 4 days ago when transitioning himself in his house, and he has since remained in the floor, unable to get himself up. He denies any head trauma or LOC from the fall.  His daughter normally checks on him but was away for a few days, and when she checked on him today he was found in the floor.  He was found grossly soiled with dried fecal matter on his back and wounds.  He reports chills, fatigue, weakness, and aches.  He also reports that has been greater than 7 days since he last ate.  He denies any head trauma or loss of consciousness from the fall.  03/22/21- patient is stable on room air. He is not on pressors. S/p Surgery evaluation.  Palliative care consultation ordered due to poor prognosis with large wounds unlikely to heal. Psychiatry evaluation due to clinical signs of depression.    03/23/21- patient is going into refeeding syndrome with severe electrolyte derrangement post introduction of nourishment. His surgery has been postponed.  Repletion in progress.   Pertinent  Medical History  Paraplegia of bilateral lower extremities Neurogenic bladder requiring Suprapubic catheter Osteomyelitis Decubitus Ulcers Anxiety Depression  Micro Data:  03/21/2021: SARS-CoV-2 &  Influenza PCR>>negative 03/21/2021: Blood culture x2>> 03/21/2021: Urine >>  Antimicrobials:  Flagyl 11/23 x1 dose Cefepime 11/23 x1 dose Vancomycin 11/23>> Zosyn 11/23>>   Objective   Blood pressure (!) 90/57, pulse (!) 105, temperature 99.6 F (37.6 C), temperature source Oral, resp. rate (!) 21, height 5\' 10"  (1.778 m), weight 42.6 kg, SpO2 98 %.        Intake/Output Summary (Last 24 hours) at 03/23/2021 0910 Last data filed at 03/23/2021 [id number] Gross per 24 hour  Intake 2830.28 ml  Output 4075 ml  Net -1244.72 ml    Filed Weights   03/21/21 1825 03/22/21 0500 03/23/21 0500  Weight: 54.4 kg 40.3 kg 42.6 kg    Examination: General: Cachectic, chronically ill appearing male, laying in bed, grossly contaminated with fecal matter dried onto back and legs, in NAD HENT: Atraumatic, normocephalic, neck supple, no JVD.  Dry mucus membranes Lungs: Clear breath sounds throughout, mild tachypnea, even Cardiovascular: Tachycardia, regular rhythm, s1s2, No M/R/G Abdomen: Soft, nontender, nondistended, no guarding or rebound tenderness, BS+ x4 Extremities: paraplegia of bilateral LE (baseline) Neuro: Awake and alert, oriented to person, place, time GU: Suprapubic catheter in place draining cloudy urine Skin: Grossly contaminated, open, draining decubitus ulcers with dried fecal matter.  See images below.     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     -MVT    -RD evaluation     - pharmacy consultation -electrlytes  Tachycardia due to sepsis & Dehydration -Continuous cardiac monitoring -Maintain MAP >65 -IV fluids (received 30 cc IVF resuscitation in ED) -Vasopressors as needed to maintain MAP goal -Trend lactic acid until normalized (1.5 ~ 1.0) -HS Troponin normal (13)  Acute Kidney Injury Hypocalcemia  Mild Hyponatremia Anion gap metabolic acidosis -Monitor I&O's / urinary output -Follow BMP -Ensure adequate renal perfusion -Avoid nephrotoxic agents as able -Replace electrolytes as indicated -Pharmacy consulted for assistance with electrolyte replacement -Received 2g of Calcium gluconate in ED -IV fluids -Check CK ~ 259 -Follow lactic acid  Anemia without overt s/sx of bleeding Thrombocytopenia, suspect due to sepsis -Monitor for S/Sx of bleeding -Trend CBC -SCD's for VTE Prophylaxis (will hold chemical prophylaxis for now given anemia) -Transfuse for Hgb <7 -To receive 1 unit pRBC's 11/23    Best Practice (right click and "Reselect all SmartList Selections" daily)   Diet/type: Regular consistency (see orders) DVT prophylaxis: SCD GI prophylaxis: N/A Lines: N/A Foley:  N/A, Chronic suprapubic catheter Code Status:  full code Last date of multidisciplinary goals of care discussion [N/A]  Labs   CBC: Recent Labs  Lab 03/21/21 1950 03/21/21 2153 03/22/21 0414 03/23/21 0542  WBC 10.4  --  9.9 7.8  NEUTROABS 9.1*  --   --   --   HGB 7.1* 6.3* 9.6* 8.6*  HCT 22.2* 20.0* 30.5* 27.7*  MCV 63.6*  --  67.9* 68.6*  PLT 104*  --  97* 80*     Basic Metabolic Panel: Recent Labs  Lab 03/21/21 1950 03/22/21 0414 03/23/21 0542  03/23/21 0750  NA 134* 141 141  --   K 3.6 3.5 2.3* 2.5*  CL 102 107 105  --   CO2 16* 20* 28  --   GLUCOSE 74 89 97  --   BUN 123* 82* 19  --   CREATININE 1.86* 0.99 0.45*  --   CALCIUM 5.9* 7.3* 7.0*  --   MG  --   --  2.3  --   PHOS  --   --  2.5  --     GFR: Estimated Creatinine Clearance: 70.3 mL/min (A) (by C-G formula based on SCr of 0.45 mg/dL (L)). Recent Labs  Lab 03/21/21 1834 03/21/21 1950 03/21/21 2049 03/22/21 0414 03/23/21 0542  PROCALCITON 85.62  --   --  36.94 9.21  WBC  --  10.4  --  9.9 7.8  LATICACIDVEN  --  1.5 1.0  --   --      Liver Function Tests: Recent Labs  Lab 03/21/21 1950  AST 28  ALT 21  ALKPHOS 69  BILITOT 1.4*  PROT 6.0*  ALBUMIN 1.8*    No results for input(s): LIPASE, AMYLASE in the last 168 hours. No results for input(s): AMMONIA in the last 168 hours.  ABG No results  found for: PHART, PCO2ART, PO2ART, HCO3, TCO2, ACIDBASEDEF, O2SAT   Coagulation Profile: Recent Labs  Lab 03/21/21 1950 03/23/21 0542  INR 1.5* 1.4*     Cardiac Enzymes: Recent Labs  Lab 03/21/21 1950 03/22/21 0414  CKTOTAL 259 161     HbA1C: No results found for: HGBA1C  CBG: Recent Labs  Lab 03/22/21 0321  GLUCAP 76     Review of Systems:   Positives in BOLD: Gen: Denies fever, chills, weight change, fatigue, night sweats HEENT: Denies blurred vision, double vision, hearing loss, tinnitus, sinus congestion, rhinorrhea, sore throat, neck stiffness, dysphagia PULM: Denies shortness of breath, cough, sputum production, hemoptysis, wheezing CV: Denies chest pain, edema, orthopnea, paroxysmal nocturnal dyspnea, palpitations GI: Denies abdominal pain, nausea, vomiting, diarrhea, hematochezia, melena, constipation, change in bowel habits GU: Denies dysuria, hematuria, polyuria, oliguria, urethral discharge Endocrine: Denies hot or cold intolerance, polyuria, polyphagia or appetite change Derm: Denies rash, dry skin, scaling or peeling  skin change Heme: Denies easy bruising, bleeding, bleeding gums Neuro: Denies headache, numbness, weakness, slurred speech, loss of memory or consciousness   Past Medical History:  He,  has a past medical history of Anxiety, Decubitus ulcer, Depression, Neurogenic bladder, Osteomyelitis ([place]), and Paraparesis of both lower limbs ([place]) (02/21/00).   Surgical History:   Past Surgical History:  Procedure Laterality Date   AMPUTATION Right 03/19/2018   Procedure: 5th Metatarsal Resection;  Surgeon: Albertine Patricia, DPM;  Location: [facility] ORS;  Service: Podiatry;  Laterality: Right;   AMPUTATION TOE Right 04/29/2015   Procedure: AMPUTATION TOE;  Surgeon: Sharlotte Alamo, MD;  Location: [facility] ORS;  Service: Podiatry;  Laterality: Right;   IRRIGATION AND DEBRIDEMENT BUTTOCKS     TOE AMPUTATION Right      Social History:   reports that he quit smoking about 4 years ago. His smoking use included cigarettes. He smoked an average of .5 packs per day. He has never used smokeless tobacco. He reports current drug use. Drug: Marijuana. He reports that he does not drink alcohol.   Family History:  His family history includes Lymphoma in his sister.   Allergies Allergies  Allergen Reactions   Orange Fruit [Citrus] Other (See Comments)    Blisters only from [place]!!! Patient is not allergic from all citrus   Sulfa Antibiotics     Throwing up blood      Home Medications  Prior to Admission medications   Medication Sig Start Date End Date Taking? Authorizing Provider  sodium hypochlorite (DAKIN'S 1/4 STRENGTH) 0.125 % SOLN For 7 days Moisten gauze with 1/4% Dakins and pack right ischial wound Patient not taking: Reported on 03/21/2021 02/12/21   Samuella Cota, MD     Critical care provider statement:   Total critical care time: 33 minutes   Performed by: Lanney Gins MD   Critical care time was exclusive of separately billable procedures and treating other patients.   Critical care was  necessary to treat or prevent imminent or life-threatening deterioration.   Critical care was time spent personally by me on the following activities: development of treatment plan with patient and/or surrogate as well as nursing, discussions with consultants, evaluation of patient's response to treatment, examination of patient, obtaining history from patient or surrogate, ordering and performing treatments and interventions, ordering and review of laboratory studies, ordering and review of radiographic studies, pulse oximetry and re-evaluation of patient's condition.    Ottie Glazier, M.D.  Pulmonary & Critical Care Medicine

## 2021-03-23 NOTE — Progress Notes (Signed)
Patient ID: Jonathon York, male   DOB: 07-24-1975, 45 y.o.   MRN: [id number]     SURGICAL PROGRESS NOTE   Hospital Day(s): 2.   Interval History: Patient seen and examined, no acute events or new complaints overnight. Patient reports feeling the same.  She continued having pain on the sacral antecubitus area.  He denies any fever or chills.  Vital signs in last 24 hours: [min-max] current  Temp:  [98.5 F (36.9 C)-99.6 F (37.6 C)] 99.6 F (37.6 C) (11/25 0800) Pulse Rate:  [93-115] 114 (11/25 1300) Resp:  [15-25] 17 (11/25 1300) BP: (76-152)/(51-122) 84/56 (11/25 1300) SpO2:  [98 %-100 %] 98 % (11/25 1300) Weight:  [42.6 kg] 42.6 kg (11/25 0500)     Height: 5\' 10"  (177.8 cm) Weight: 42.6 kg BMI (Calculated): 13.48   Physical Exam:  Constitutional: alert, cooperative and no distress  Back: Multiple sacral and decubitus ulcers with necrotic tissue  Labs:  CBC Latest Ref Rng & Units 03/23/2021 03/22/2021 03/21/2021  WBC 4.0 - 10.5 K/uL 7.8 9.9 -  Hemoglobin 13.0 - 17.0 g/dL 03/23/2021) 7.3(A) 6.3(L)  Hematocrit 39.0 - 52.0 % 27.7(L) 30.5(L) 20.0(L)  Platelets 150 - 400 K/uL 80(L) 97(L) -   CMP Latest Ref Rng & Units 03/23/2021 03/23/2021 03/23/2021  Glucose 70 - 99 mg/dL 03/25/2021) - 97  BUN 6 - 20 mg/dL 17 - 19  Creatinine 790(W - 1.24 mg/dL 4.09 - 7.35)  Sodium 135 - 145 mmol/L 140 - 141  Potassium 3.5 - 5.1 mmol/L 3.3(L) 2.5(LL) 2.3(LL)  Chloride 98 - 111 mmol/L 104 - 105  CO2 22 - 32 mmol/L 24 - 28  Calcium 8.9 - 10.3 mg/dL 7.1(L) - 7.0(L)  Total Protein 6.5 - 8.1 g/dL 3.29(J) - -  Total Bilirubin 0.3 - 1.2 mg/dL 0.8 - -  Alkaline Phos 38 - 126 U/L 60 - -  AST 15 - 41 U/L 38 - -  ALT 0 - 44 U/L 20 - -    Imaging studies: No new pertinent imaging studies   Assessment/Plan:  45 y.o. 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  Patient today with  potassium of 2.3.  I repeat potassium and it came back 2.5.  At this point I discussed again with anesthesia and he is high risk for general anesthesia at this moment.  I discussed the case with ICU physician to optimize potassium levels and will try to reschedule for tomorrow.  54, MD

## 2021-03-23 NOTE — Progress Notes (Signed)
PHARMACY CONSULT NOTE - FOLLOW UP  Pharmacy Consult for Electrolyte Monitoring and Replacement   Recent Labs: Potassium (mmol/L)  Date Value  03/23/2021 2.5 (LL)  12/25/2013 4.0   Magnesium (mg/dL)  Date Value  71/16/5790 2.3  12/25/2013 2.1   Calcium (mg/dL)  Date Value  38/33/3832 7.0 (L)   Calcium, Total (mg/dL)  Date Value  91/91/6606 8.4 (L)   Albumin (g/dL)  Date Value  00/45/9977 1.8 (L)  06/10/2012 2.8 (L)   Phosphorus (mg/dL)  Date Value  41/42/3953 2.5   Sodium (mmol/L)  Date Value  03/23/2021 141  12/25/2013 142     Assessment: 45 y.o. Male with PMH of paraplegia of lower extremities, presented after falling with transfer, found after 4 days.  Admitted with Severe Sepsis due to UTI and infected decubitus wounds.  LR@100ml /h K: 3.5>2.3>2.5 Mg: 2.3 Phos: 2.5  Goal of Therapy:  Electrolytes WNL   Plan:  K: 2.3 > 2.5 on repeat after PO. Only received 1 of 2 IV KCL on 1st order, but not reflected in this level. Further repletion with IV (10 q1h x4) per MD. Will add additional PO x1 based on response. Mg 2.3 (ULN):  MgSO4 2g IV x1 (MD ordered) Phos: 2.5 (LLN) Kphos 250mg  PO TID (~3-60meq K+ per day) (MD ordered)  11m ,PharmD Clinical Pharmacist 03/23/2021 9:30 AM

## 2021-03-24 ENCOUNTER — Encounter
Admission: EM | Disposition: A | Discharge status: Nursing Home (Includes ICF) | Payer: Self-pay | Source: Home / Self Care | Attending: Internal Medicine

## 2021-03-24 ENCOUNTER — Inpatient Hospital Stay: Payer: Medicare HMO | Admitting: Anesthesiology

## 2021-03-24 HISTORY — PX: INCISION AND DRAINAGE ABSCESS: SHX5864

## 2021-03-24 LAB — BASIC METABOLIC PANEL
Anion gap: 5 (ref 5–15)
BUN: 11 mg/dL (ref 6–20)
CO2: 23 mmol/L (ref 22–32)
Calcium: 7.1 mg/dL — ABNORMAL LOW (ref 8.9–10.3)
Chloride: 110 mmol/L (ref 98–111)
Creatinine, Ser: 0.3 mg/dL — ABNORMAL LOW (ref 0.61–1.24)
GFR, Estimated: >60 mL/min (ref >60)
Glucose, Bld: 119 mg/dL — ABNORMAL HIGH (ref 70–99)
Potassium: 3.4 mmol/L — ABNORMAL LOW (ref 3.5–5.1)
Sodium: 138 mmol/L (ref 135–145)

## 2021-03-24 LAB — CBC
HCT: 23.1 % — ABNORMAL LOW (ref 39.0–52.0)
Hemoglobin: 7.1 g/dL — ABNORMAL LOW (ref 13.0–17.0)
MCH: 21.4 pg — ABNORMAL LOW (ref 26.0–34.0)
MCHC: 30.7 g/dL (ref 30.0–36.0)
MCV: 69.6 fL — ABNORMAL LOW (ref 80.0–100.0)
Platelets: 78 10*3/uL — ABNORMAL LOW (ref 150–400)
RBC: 3.32 MIL/uL — ABNORMAL LOW (ref 4.22–5.81)
RDW: 23.8 % — ABNORMAL HIGH (ref 11.5–15.5)
WBC: 11.3 10*3/uL — ABNORMAL HIGH (ref 4.0–10.5)
nRBC: 0 % (ref 0.0–0.2)

## 2021-03-24 LAB — URINE CULTURE: Culture: >=100000 — AB

## 2021-03-24 LAB — ALBUMIN: Albumin: 2 g/dL — ABNORMAL LOW (ref 3.5–5.0)

## 2021-03-24 LAB — PHOSPHORUS: Phosphorus: 2.3 mg/dL — ABNORMAL LOW (ref 2.5–4.6)

## 2021-03-24 LAB — MAGNESIUM: Magnesium: 2.4 mg/dL (ref 1.7–2.4)

## 2021-03-24 SURGERY — INCISION AND DRAINAGE, ABSCESS
Anesthesia: General | Site: Buttocks

## 2021-03-24 MED ORDER — VANCOMYCIN HCL 1000 MG/200ML IV SOLN
1000.0000 mg | INTRAVENOUS | Status: DC
  Administered 2021-03-24: 1000 mg via INTRAVENOUS
  Filled 2021-03-24: qty 200

## 2021-03-24 MED ORDER — PROPOFOL 10 MG/ML IV BOLUS
INTRAVENOUS | Status: DC | PRN
  Administered 2021-03-24: 110 mg via INTRAVENOUS

## 2021-03-24 MED ORDER — SODIUM CHLORIDE 0.9 % IV SOLN
2.0000 g | INTRAVENOUS | Status: DC
  Administered 2021-03-25 – 2021-03-26 (×2): 2 g via INTRAVENOUS
  Filled 2021-03-24: qty 2
  Filled 2021-03-24 (×2): qty 20

## 2021-03-24 MED ORDER — MIDAZOLAM HCL 2 MG/2ML IJ SOLN
INTRAMUSCULAR | Status: DC | PRN
  Administered 2021-03-24 (×2): 1 mg via INTRAVENOUS

## 2021-03-24 MED ORDER — VANCOMYCIN HCL IN DEXTROSE 1-5 GM/200ML-% IV SOLN
1000.0000 mg | INTRAVENOUS | Status: DC
Start: 2021-03-24 — End: 2021-03-24
  Filled 2021-03-24: qty 200

## 2021-03-24 MED ORDER — FENTANYL CITRATE (PF) 100 MCG/2ML IJ SOLN: 25.0000 ug | INTRAMUSCULAR | Status: DC | PRN

## 2021-03-24 MED ORDER — PHENYLEPHRINE HCL (PRESSORS) 10 MG/ML IV SOLN
INTRAVENOUS | Status: DC | PRN
  Administered 2021-03-24 (×5): 100 ug via INTRAVENOUS

## 2021-03-24 MED ORDER — FENTANYL CITRATE (PF) 100 MCG/2ML IJ SOLN
INTRAMUSCULAR | Status: AC
  Filled 2021-03-24: qty 2

## 2021-03-24 MED ORDER — PHENYLEPHRINE HCL (PRESSORS) 10 MG/ML IV SOLN
INTRAVENOUS | Status: AC
  Filled 2021-03-24: qty 1

## 2021-03-24 MED ORDER — LACTATED RINGERS IV SOLN: INTRAVENOUS | Status: DC | PRN

## 2021-03-24 MED ORDER — MIDAZOLAM HCL 2 MG/2ML IJ SOLN
INTRAMUSCULAR | Status: AC
  Filled 2021-03-24: qty 2

## 2021-03-24 MED ORDER — MELATONIN 5 MG PO TABS: 5.0000 mg | ORAL_TABLET | Freq: Every day | ORAL | Status: DC

## 2021-03-24 MED ORDER — POTASSIUM CHLORIDE CRYS ER 20 MEQ PO TBCR
20.0000 meq | EXTENDED_RELEASE_TABLET | Freq: Once | ORAL | Status: AC
  Administered 2021-03-24: 20 meq via ORAL
  Filled 2021-03-24: qty 1

## 2021-03-24 MED ORDER — ONDANSETRON HCL 4 MG/2ML IJ SOLN
INTRAMUSCULAR | Status: AC
  Filled 2021-03-24: qty 2

## 2021-03-24 MED ORDER — HYDROCODONE-ACETAMINOPHEN 7.5-325 MG PO TABS
1.0000 | ORAL_TABLET | Freq: Once | ORAL | Status: DC | PRN
  Filled 2021-03-24: qty 1

## 2021-03-24 MED ORDER — ROCURONIUM BROMIDE 100 MG/10ML IV SOLN
INTRAVENOUS | Status: DC | PRN
  Administered 2021-03-24: 50 mg via INTRAVENOUS

## 2021-03-24 MED ORDER — ONDANSETRON HCL 4 MG/2ML IJ SOLN: 4.0000 mg | Freq: Once | INTRAMUSCULAR | Status: DC | PRN

## 2021-03-24 MED ORDER — LIDOCAINE HCL (PF) 2 % IJ SOLN
INTRAMUSCULAR | Status: AC
  Filled 2021-03-24: qty 5

## 2021-03-24 MED ORDER — TRAZODONE HCL 50 MG PO TABS
50.0000 mg | ORAL_TABLET | Freq: Every evening | ORAL | Status: DC | PRN
  Administered 2021-03-24: 01:00:00 50 mg via ORAL
  Filled 2021-03-24: qty 1

## 2021-03-24 MED ORDER — DAKINS (1/2 STRENGTH) 0.25 % EX SOLN
Freq: Two times a day (BID) | CUTANEOUS | Status: AC
  Filled 2021-03-24 (×2): qty 473

## 2021-03-24 MED ORDER — ONDANSETRON HCL 4 MG/2ML IJ SOLN
INTRAMUSCULAR | Status: DC | PRN
  Administered 2021-03-24: 4 mg via INTRAVENOUS

## 2021-03-24 MED ORDER — ROCURONIUM BROMIDE 10 MG/ML (PF) SYRINGE
PREFILLED_SYRINGE | INTRAVENOUS | Status: AC
  Filled 2021-03-24: qty 10

## 2021-03-24 MED ORDER — PROPOFOL 10 MG/ML IV BOLUS
INTRAVENOUS | Status: AC
  Filled 2021-03-24: qty 20

## 2021-03-24 MED ORDER — 0.9 % SODIUM CHLORIDE (POUR BTL) OPTIME
TOPICAL | Status: DC | PRN
  Administered 2021-03-24: 1000 mL

## 2021-03-24 MED ORDER — SUGAMMADEX SODIUM 200 MG/2ML IV SOLN
INTRAVENOUS | Status: DC | PRN
  Administered 2021-03-24: 200 mg via INTRAVENOUS

## 2021-03-24 SURGICAL SUPPLY — 24 items
BLADE CLIPPER SURG (BLADE) ×2 IMPLANT
BLADE SURG 15 STRL LF DISP TIS (BLADE) ×1 IMPLANT
BLADE SURG 15 STRL SS (BLADE) ×1
BNDG STRETCH 4X75 STRL LF (GAUZE/BANDAGES/DRESSINGS) ×2 IMPLANT
BRUSH SCRUB EZ  4% CHG (MISCELLANEOUS) ×1
BRUSH SCRUB EZ 4% CHG (MISCELLANEOUS) ×1 IMPLANT
DRAPE CHEST BREAST 77X106 FENE (MISCELLANEOUS) ×2 IMPLANT
DRAPE LAPAROTOMY 77X122 PED (DRAPES) ×2 IMPLANT
ELECT REM PT RETURN 9FT ADLT (ELECTROSURGICAL) ×2
ELECTRODE REM PT RTRN 9FT ADLT (ELECTROSURGICAL) ×1 IMPLANT
GAUZE 4X4 16PLY ~~LOC~~+RFID DBL (SPONGE) ×2 IMPLANT
GLOVE SURG ENC MOIS LTX SZ6.5 (GLOVE) ×2 IMPLANT
GLOVE SURG UNDER POLY LF SZ6.5 (GLOVE) ×2 IMPLANT
GOWN STRL REUS W/ TWL LRG LVL3 (GOWN DISPOSABLE) ×2 IMPLANT
GOWN STRL REUS W/TWL LRG LVL3 (GOWN DISPOSABLE) ×2
MANIFOLD NEPTUNE II (INSTRUMENTS) ×2 IMPLANT
NEEDLE HYPO 22GX1.5 SAFETY (NEEDLE) ×2 IMPLANT
NS IRRIG 1000ML POUR BTL (IV SOLUTION) ×2 IMPLANT
PACK BASIN MINOR ARMC (MISCELLANEOUS) ×2 IMPLANT
PAD ABD DERMACEA PRESS 5X9 (GAUZE/BANDAGES/DRESSINGS) ×2 IMPLANT
SOL PREP PVP 2OZ (MISCELLANEOUS) ×4
SOLUTION PREP PVP 2OZ (MISCELLANEOUS) ×2 IMPLANT
SPONGE T-LAP 18X18 ~~LOC~~+RFID (SPONGE) ×4 IMPLANT
WATER STERILE IRR 500ML POUR (IV SOLUTION) ×2 IMPLANT

## 2021-03-24 NOTE — Consult Note (Addendum)
Pharmacy Antibiotic Note  Jonathon York is a 45 y.o. male with medical history including paraplegia, neurogenic bladder, decubitus ulcers / chronic sacral osteomyelitis admitted on 03/21/2021 with  found down at home after four days . There is concern for sepsis. Pharmacy has been consulted for vancomycin and Zosyn dosing.  Plan:  Zosyn 3.375 g IV q8h (4-hr extended infusion)  Vancomycin 1 g IV LD. Will start vancomycin 1250 mg q24H, will change to 1000 mg q24H due to decrease in body weight.  Predicted AUC of 519. Goal AUC of 400-550. Obtain vancomycin level prior to the 4th-5th dose.   Height: 5\' 10"  (177.8 cm) Weight: 42.6 kg (93 lb 14.7 oz) IBW/kg (Calculated) : 73  Temp (24hrs), Avg:99.5 F (37.5 C), Min:99.3 F (37.4 C), Max:99.6 F (37.6 C)  Recent Labs  Lab 03/21/21 1950 03/21/21 2049 03/22/21 0414 03/23/21 0542 03/23/21 1159 03/24/21 0536  WBC 10.4  --  9.9 7.8  --  11.3*  CREATININE 1.86*  --  0.99 0.45* 0.63 0.30*  LATICACIDVEN 1.5 1.0  --   --   --   --   VANCORANDOM  --   --  15  --   --   --      Estimated Creatinine Clearance: 70.3 mL/min (A) (by C-G formula based on SCr of 0.3 mg/dL (L)).    Allergies  Allergen Reactions   Orange Fruit [Citrus] Other (See Comments)    Blisters only from [place]!!! Patient is not allergic from all citrus   Sulfa Antibiotics     Throwing up blood     Antimicrobials this admission: Cefepime 11/23 x 1 Metronidazole 11/23 x 1 Vancomycin 11/23 >>  Zosyn 11/24 >>   Dose adjustments this admission: N/A  Microbiology results: 11/23 BCx: 2 out of 4 (same set) strep sp.  11/23 UCx: >=100,000 COLONIES/mL MORGANELLA MORGANII   Thank you for allowing pharmacy to be a part of this patient's care.  12/23 03/24/2021 9:00 AM

## 2021-03-24 NOTE — Transfer of Care (Signed)
Immediate Anesthesia Transfer of Care Note  Patient: Jonathon York  Procedure(s) Performed: INCISION AND DRAINAGE STAGE IV DECUBITUS ULCER (Buttocks)  Patient Location: PACU  Anesthesia Type:General  Level of Consciousness: drowsy  Airway & Oxygen Therapy: Patient Spontanous Breathing  Post-op Assessment: Report given to RN  Post vital signs: stable  Last Vitals:  Vitals Value Taken Time  BP 106/71 03/24/21 0935  Temp    Pulse 84 03/24/21 0938  Resp 20 03/24/21 0938  SpO2 99 % 03/24/21 0938  Vitals shown include unvalidated device data.  Last Pain:  Vitals:   03/23/21 2000  TempSrc:   PainSc: 1          Complications: No notable events documented.

## 2021-03-24 NOTE — Progress Notes (Signed)
NAME:  Jonathon York, MRN:  [id number], DOB:  23-Jul-1975, LOS: 3 ADMISSION DATE:  03/21/2021, CONSULTATION DATE:  03/21/2021 REFERRING MD:  Dr. Ellender Hose, CHIEF COMPLAINT:  Fall   Brief Pt Description / Synopsis:  45 y.o. Male with PMH of paraplegia of lower extremities, presented after falling with transfer, found after 4 days.  Admitted with Severe Sepsis due to UTI and infected decubitus wounds.  History of Present Illness:  Jonathon York is a 45 year old male with a history of paraplegia of the lower extremities, chronic sacral decubitus ulcers, neurogenic bladder status post suprapubic catheter who presented to [facility] ED on 03/21/2021 after being found down in the floor at home.    The patient reports that he fell 4 days ago when transitioning himself in his house, and he has since remained in the floor, unable to get himself up. He denies any head trauma or LOC from the fall.  His daughter normally checks on him but was away for a few days, and when she checked on him today he was found in the floor.  He was found grossly soiled with dried fecal matter on his back and wounds.  He reports chills, fatigue, weakness, and aches.  He also reports that has been greater than 7 days since he last ate.  He denies any head trauma or loss of consciousness from the fall.  03/22/21- patient is stable on room air. He is not on pressors. S/p Surgery evaluation.  Palliative care consultation ordered due to poor prognosis with large wounds unlikely to heal. Psychiatry evaluation due to clinical signs of depression.    03/23/21- patient is going into refeeding syndrome with severe electrolyte derrangement post introduction of nourishment. His surgery has been postponed.  Repletion in progress.  03/24/21- patient has improved electrolytes and is being seen for consideration to OR for debridement of infected ischial wounds.     Pertinent  Medical History  Paraplegia of bilateral lower extremities Neurogenic  bladder requiring Suprapubic catheter Osteomyelitis Decubitus Ulcers Anxiety Depression  Micro Data:  03/21/2021: SARS-CoV-2 & Influenza PCR>>negative 03/21/2021: Blood culture x2>> 03/21/2021: Urine >>  Antimicrobials:  Flagyl 11/23 x1 dose Cefepime 11/23 x1 dose Vancomycin 11/23>> Zosyn 11/23>>   Objective   Blood pressure 101/64, pulse 87, temperature 98.9 F (37.2 C), resp. rate 16, height 5\' 10"  (1.778 m), weight 42.6 kg, SpO2 100 %.        Intake/Output Summary (Last 24 hours) at 03/24/2021 1048 Last data filed at 03/24/2021 1000 Gross per 24 hour  Intake 3561.52 ml  Output 3275 ml  Net 286.52 ml    Filed Weights   03/22/21 0500 03/23/21 0500 03/24/21 0500  Weight: 40.3 kg 42.6 kg 42.6 kg    Examination: General: Cachectic, chronically ill appearing male, laying in bed, grossly contaminated with fecal matter dried onto back and legs, in NAD HENT: Atraumatic, normocephalic, neck supple, no JVD.  Dry mucus membranes Lungs: Clear breath sounds throughout, mild tachypnea, even Cardiovascular: Tachycardia, regular rhythm, s1s2, No M/R/G Abdomen: Soft, nontender, nondistended, no guarding or rebound tenderness, BS+ x4 Extremities: paraplegia of bilateral LE (baseline) Neuro: Awake and alert, oriented to person, place, time GU: Suprapubic catheter in place draining cloudy urine Skin: Grossly contaminated, open, draining decubitus ulcers with dried fecal matter.  See images below.     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     -MVT    -RD evaluation     - pharmacy consultation -electrlytes  Tachycardia due to sepsis & Dehydration -Continuous cardiac monitoring -Maintain MAP >65 -IV fluids (received 30 cc IVF resuscitation in ED) -Vasopressors as needed to maintain MAP goal -Trend lactic acid until normalized (1.5 ~ 1.0) -HS Troponin normal (13)  Acute Kidney Injury Hypocalcemia  Mild Hyponatremia Anion gap metabolic acidosis -Monitor I&O's / urinary output -Follow BMP -Ensure adequate renal perfusion -Avoid nephrotoxic agents as able -Replace electrolytes as indicated -Pharmacy consulted for assistance with electrolyte replacement -Received 2g of Calcium gluconate in ED -IV fluids -Check CK ~ 259 -Follow lactic acid  Anemia without overt s/sx of bleeding Thrombocytopenia, suspect due to sepsis -Monitor for S/Sx of bleeding -Trend CBC -SCD's for VTE Prophylaxis (will hold chemical prophylaxis for now given anemia) -Transfuse for Hgb <7 -To receive 1 unit pRBC's 11/23    Best Practice (right click and "Reselect all SmartList Selections" daily)   Diet/type: Regular consistency (see orders) DVT prophylaxis: SCD GI prophylaxis: N/A Lines: N/A Foley:  N/A, Chronic suprapubic catheter Code Status:  full code Last date of multidisciplinary goals of care discussion [N/A]  Labs   CBC: Recent Labs  Lab 03/21/21 1950 03/21/21 2153 03/22/21 0414 03/23/21 0542 03/24/21 0536  WBC 10.4  --  9.9 7.8 11.3*  NEUTROABS 9.1*  --   --   --   --   HGB 7.1* 6.3* 9.6* 8.6* 7.1*  HCT 22.2* 20.0* 30.5* 27.7* 23.1*  MCV 63.6*  --   67.9* 68.6* 69.6*  PLT 104*  --  97* 80* 78*     Basic Metabolic Panel: Recent Labs  Lab 03/21/21 1950 03/22/21 0414 03/23/21 0542 03/23/21 0750 03/23/21 1159 03/23/21 2134 03/24/21 0536  NA 134* 141 141  --  140  --  138  K 3.6 3.5 2.3* 2.5* 3.3* 3.0* 3.4*  CL 102 107 105  --  104  --  110  CO2 16* 20* 28  --  24  --  23  GLUCOSE 74 89 97  --  113*  --  119*  BUN 123* 82* 19  --  17  --  11  CREATININE 1.86* 0.99 0.45*  --  0.63  --  0.30*  CALCIUM 5.9* 7.3* 7.0*  --  7.1*  --  7.1*  MG  --   --  2.3  --   --   --  2.4  PHOS  --   --  2.5  --   --   --  2.3*    GFR: Estimated Creatinine Clearance: 70.3 mL/min (A) (by C-G formula based on SCr of 0.3 mg/dL (L)). Recent Labs  Lab 03/21/21 1834 03/21/21 1950 03/21/21 2049 03/22/21 0414 03/23/21 0542 03/24/21 0536  PROCALCITON 85.62  --   --  36.94 9.21  --   WBC  --  10.4  --  9.9 7.8 11.3*  LATICACIDVEN  --  1.5 1.0  --   --   --      Liver Function Tests: Recent Labs  Lab 03/21/21 1950 03/23/21 1159 03/24/21 0536  AST 28 38  --   ALT 21 20  --   ALKPHOS 69 60  --   BILITOT 1.4* 0.8  --   PROT 6.0* 5.5*  --   ALBUMIN 1.8* 1.7* 2.0*    No results for input(s): LIPASE, AMYLASE in the last 168 hours. No results for input(s): AMMONIA in the last 168 hours.  ABG No results found for: PHART, PCO2ART, PO2ART, HCO3, TCO2, ACIDBASEDEF, O2SAT   Coagulation Profile: Recent Labs  Lab 03/21/21 1950 03/23/21 0542  INR 1.5* 1.4*     Cardiac Enzymes: Recent Labs  Lab 03/21/21 1950 03/22/21 0414  CKTOTAL 259 161     HbA1C: No results found for: HGBA1C  CBG: Recent Labs  Lab 03/22/21 0321  GLUCAP 76     Review of Systems:   Positives in BOLD: Gen: Denies fever, chills, weight change, fatigue, night sweats HEENT: Denies blurred vision, double vision, hearing loss, tinnitus, sinus congestion, rhinorrhea, sore throat, neck stiffness, dysphagia PULM: Denies shortness of breath, cough, sputum  production, hemoptysis, wheezing CV: Denies chest pain, edema, orthopnea, paroxysmal nocturnal dyspnea, palpitations GI: Denies abdominal pain, nausea, vomiting, diarrhea, hematochezia, melena, constipation, change in bowel habits GU: Denies dysuria, hematuria, polyuria, oliguria, urethral discharge Endocrine: Denies hot or cold intolerance, polyuria, polyphagia or appetite change Derm: Denies rash, dry skin, scaling or peeling skin change Heme: Denies easy bruising, bleeding, bleeding gums Neuro: Denies headache, numbness, weakness, slurred speech, loss of memory or consciousness   Past Medical History:  He,  has a past medical history of Anxiety, Decubitus ulcer, Depression, Neurogenic bladder, Osteomyelitis (HCC), and Paraparesis of both lower limbs (HCC) (02/21/00).   Surgical History:   Past Surgical History:  Procedure Laterality Date   AMPUTATION Right 03/19/2018   Procedure: 5th Metatarsal Resection;  Surgeon: Recardo Evangelist, DPM;  Location: [facility] ORS;  Service: Podiatry;  Laterality: Right;   AMPUTATION TOE Right 04/29/2015   Procedure: AMPUTATION TOE;  Surgeon: Linus Galas, MD;  Location: [facility] ORS;  Service: Podiatry;  Laterality: Right;   IRRIGATION AND DEBRIDEMENT BUTTOCKS     TOE AMPUTATION Right      Social History:   reports that he quit smoking about 4 years ago. His smoking use included cigarettes. He smoked an average of .5 packs per day. He has never used smokeless tobacco. He reports current drug use. Drug: Marijuana. He reports that he does not drink alcohol.   Family History:  His family history includes Lymphoma in his sister.   Allergies Allergies  Allergen Reactions   Orange Fruit [Citrus] Other (See Comments)    Blisters only from ORANGES!!! Patient is not allergic from all citrus   Sulfa Antibiotics     Throwing up blood      Home Medications  Prior to Admission medications   Medication Sig Start Date End Date Taking? Authorizing Provider  sodium  hypochlorite (DAKIN'S 1/4 STRENGTH) 0.125 % SOLN For 7 days Moisten gauze with 1/4% Dakins and pack right ischial wound Patient not taking: Reported on 03/21/2021 02/12/21   Standley Brooking, MD     Critical care provider statement:   Total critical care time: 33 minutes   Performed by: Karna Christmas MD  Critical care time was exclusive of separately billable procedures and treating other patients.   Critical care was necessary to treat or prevent imminent or life-threatening deterioration.   Critical care was time spent personally by me on the following activities: development of treatment plan with patient and/or surrogate as well as nursing, discussions with consultants, evaluation of patient's response to treatment, examination of patient, obtaining history from patient or surrogate, ordering and performing treatments and interventions, ordering and review of laboratory studies, ordering and review of radiographic studies, pulse oximetry and re-evaluation of patient's condition.    Ottie Glazier, M.D.  Pulmonary & Critical Care Medicine

## 2021-03-24 NOTE — Op Note (Addendum)
ATTENDING Surgeon(s): Carolan Shiver, MD   ANESTHESIA: General   PRE-OPERATIVE DIAGNOSIS: Sacral and bilateral decubitus ulcers, infected Stage 4   POST-OPERATIVE DIAGNOSIS: Same   PROCEDURE(S):  1.) Sharp excisional debridement of sacral and bilateral buttock and hip stage 4 infected ulcer    INTRAOPERATIVE FINDINGS:  Pre op Messurements:  Sacral: 10 x 4 cm stage 4  Left buttock/hip: 12 x 6 cm stage 4 (A = 72 sq cm) Right buttock/hip: 16 x 11 cm stage 4 (A = 176 sq cm) Right femoral head with necrosis exposed.   Post op Measurement:   Sacral: 10 x 4 cm stage 4 (A = 40 sq cm) Left buttock/hip: 16 x 6 cm stage 4 (A = 96 sq cm) Right buttock/hip: 20 x 12 cm stage 4 (A= 240 sq cm)  Pre debridement:   Post debridement:    Post debridement (right hip):    ESTIMATED BLOOD LOSS: 25 mL    SPECIMENS: Sacral and decubitus ulcers   COMPLICATIONS: None apparent   CONDITION AT END OF PROCEDURE: Hemodynamically stable and awake   INDICATIONS FOR PROCEDURE:  Patient is a 38 male with chronic sacral and bilateral buttock/hip ulcers. Patient with leukocytosis and purulent secretions from all ulcers. Debridement indicated for sepsis source control.       DETAILS OF PROCEDURE: After informed consent,patient was taken to the OR. Time out performed. General anesthesia induced. Patient placed on prone position. The back, buttock, hip and proximal thighs were cleaned and draped in sterile fashion. With #15 blade, cautery and scissors, necrotic tissue and purulence from all the ulcers were debrided. Ischemic fat tissue and muscle and bone were debrided into the sacral bone on the midline and into femoral and hip bones on bilateral buttock/hip ulcers. Hemostasis achieved. Patient tolerated the procedure well. Wounds dressed with wet to dry dressings.   This are very complex wounds and goal of debridement was for sepsis source control. Further chronic treatment of ulcers should be  evaluated at a tertiary wound care center with Plastic surgery specialized in complex wounds.

## 2021-03-24 NOTE — Progress Notes (Signed)
Assessed with ultrasound for 20g x 1.88 for pressors to infuse with iwatch. Unable to find suitable vein. RN aware.

## 2021-03-24 NOTE — Anesthesia Procedure Notes (Signed)
Procedure Name: Intubation Date/Time: 03/24/2021 8:38 AM Performed by: Garner Nash, CRNA Pre-anesthesia Checklist: Patient identified, Emergency Drugs available, Suction available and Patient being monitored Patient Re-evaluated:Patient Re-evaluated prior to induction Oxygen Delivery Method: Circle system utilized Preoxygenation: Pre-oxygenation with 100% oxygen Induction Type: IV induction Ventilation: Mask ventilation without difficulty Laryngoscope Size: Mac and 3 Grade View: Grade I Tube type: Oral Tube size: 7.5 mm Number of attempts: 1 Placement Confirmation: ETT inserted through vocal cords under direct vision, positive ETCO2 and breath sounds checked- equal and bilateral Secured at: 22 cm Tube secured with: Tape Dental Injury: Teeth and Oropharynx as per pre-operative assessment

## 2021-03-24 NOTE — Anesthesia Postprocedure Evaluation (Signed)
Anesthesia Post Note  Patient: Jonathon York  Procedure(s) Performed: INCISION AND DRAINAGE STAGE IV DECUBITUS ULCER (Buttocks)  Patient location during evaluation: PACU Anesthesia Type: General Level of consciousness: awake and awake and alert Pain management: pain level controlled Vital Signs Assessment: post-procedure vital signs reviewed and stable Respiratory status: spontaneous breathing, respiratory function stable and nonlabored ventilation Cardiovascular status: blood pressure returned to baseline Anesthetic complications: no   No notable events documented.   Last Vitals:  Vitals:   03/24/21 1100 03/24/21 1205  BP: (!) 99/59 105/60  Pulse: 78 (!) 109  Resp: 17 17  Temp:  37.2 C  SpO2: 100% 100%    Last Pain:  Vitals:   03/24/21 1205  TempSrc: Oral  PainSc: 0-No pain                 Johny Drilling

## 2021-03-24 NOTE — Progress Notes (Signed)
PHARMACY CONSULT NOTE - FOLLOW UP  Pharmacy Consult for Electrolyte Monitoring and Replacement   Recent Labs: Potassium (mmol/L)  Date Value  03/24/2021 3.4 (L)  12/25/2013 4.0   Magnesium (mg/dL)  Date Value  37/01/6268 2.4  12/25/2013 2.1   Calcium (mg/dL)  Date Value  48/54/6270 7.1 (L)   Calcium, Total (mg/dL)  Date Value  35/00/9381 8.4 (L)   Albumin (g/dL)  Date Value  82/99/3716 2.0 (L)  06/10/2012 2.8 (L)   Phosphorus (mg/dL)  Date Value  96/78/9381 2.3 (L)   Sodium (mmol/L)  Date Value  03/24/2021 138  12/25/2013 142     Assessment: 45 y.o. Male with PMH of paraplegia of lower extremities, presented after falling with transfer, found after 4 days.  Admitted with Severe Sepsis due to UTI and infected decubitus wounds.  LR@100ml /h   Goal of Therapy:  Electrolytes WNL   Plan:  Medical team gave KCL 20 mEq x 1 Started on Kphos PO 500 mg TID.  F/u with AM labs.   Ronnald Ramp ,PharmD Clinical Pharmacist 03/24/2021 8:54 AM

## 2021-03-25 ENCOUNTER — Encounter: Payer: Self-pay | Admitting: General Surgery

## 2021-03-25 LAB — CBC
HCT: 22.7 % — ABNORMAL LOW (ref 39.0–52.0)
Hemoglobin: 7 g/dL — ABNORMAL LOW (ref 13.0–17.0)
MCH: 22.1 pg — ABNORMAL LOW (ref 26.0–34.0)
MCHC: 30.8 g/dL (ref 30.0–36.0)
MCV: 71.6 fL — ABNORMAL LOW (ref 80.0–100.0)
Platelets: 83 10*3/uL — ABNORMAL LOW (ref 150–400)
RBC: 3.17 MIL/uL — ABNORMAL LOW (ref 4.22–5.81)
RDW: 23.7 % — ABNORMAL HIGH (ref 11.5–15.5)
WBC: 11.3 10*3/uL — ABNORMAL HIGH (ref 4.0–10.5)
nRBC: 0 % (ref 0.0–0.2)

## 2021-03-25 LAB — BASIC METABOLIC PANEL
Anion gap: 7 (ref 5–15)
BUN: 6 mg/dL (ref 6–20)
CO2: 23 mmol/L (ref 22–32)
Calcium: 7 mg/dL — ABNORMAL LOW (ref 8.9–10.3)
Chloride: 107 mmol/L (ref 98–111)
Creatinine, Ser: 0.41 mg/dL — ABNORMAL LOW (ref 0.61–1.24)
GFR, Estimated: >60 mL/min (ref >60)
Glucose, Bld: 159 mg/dL — ABNORMAL HIGH (ref 70–99)
Potassium: 2.5 mmol/L — CL (ref 3.5–5.1)
Sodium: 137 mmol/L (ref 135–145)

## 2021-03-25 LAB — MAGNESIUM
Magnesium: 2 mg/dL (ref 1.7–2.4)
Magnesium: 1.7 mg/dL (ref 1.7–2.4)

## 2021-03-25 LAB — POTASSIUM
Potassium: 4 mmol/L (ref 3.5–5.1)
Potassium: 3.9 mmol/L (ref 3.5–5.1)

## 2021-03-25 LAB — PHOSPHORUS
Phosphorus: 2.5 mg/dL (ref 2.5–4.6)
Phosphorus: 2.2 mg/dL — ABNORMAL LOW (ref 2.5–4.6)

## 2021-03-25 LAB — ALBUMIN: Albumin: 2.4 g/dL — ABNORMAL LOW (ref 3.5–5.0)

## 2021-03-25 MED ORDER — MAGNESIUM SULFATE 2 GM/50ML IV SOLN
2.0000 g | Freq: Once | INTRAVENOUS | Status: AC
  Administered 2021-03-25: 20:00:00 2 g via INTRAVENOUS
  Filled 2021-03-25: qty 50

## 2021-03-25 MED ORDER — POTASSIUM PHOSPHATES 15 MMOLE/5ML IV SOLN
15.0000 mmol | Freq: Once | INTRAVENOUS | Status: AC
  Administered 2021-03-25: 23:00:00 15 mmol via INTRAVENOUS
  Filled 2021-03-25: qty 5

## 2021-03-25 MED ORDER — ALBUMIN HUMAN 25 % IV SOLN: 25.0000 g | Freq: Once | INTRAVENOUS | Status: DC

## 2021-03-25 MED ORDER — MORPHINE SULFATE (PF) 2 MG/ML IV SOLN
2.0000 mg | Freq: Once | INTRAVENOUS | Status: AC
  Administered 2021-03-25: 19:00:00 2 mg via INTRAVENOUS
  Filled 2021-03-25: qty 1

## 2021-03-25 MED ORDER — POTASSIUM CHLORIDE 10 MEQ/100ML IV SOLN
10.0000 meq | INTRAVENOUS | Status: AC
  Administered 2021-03-25 (×4): 10 meq via INTRAVENOUS
  Filled 2021-03-25 (×4): qty 100

## 2021-03-25 MED ORDER — POTASSIUM CHLORIDE CRYS ER 20 MEQ PO TBCR: 40.0000 meq | EXTENDED_RELEASE_TABLET | Freq: Once | ORAL | Status: DC

## 2021-03-25 MED ORDER — LACTATED RINGERS IV BOLUS
2000.0000 mL | Freq: Once | INTRAVENOUS | Status: AC
  Administered 2021-03-25: 14:00:00 2000 mL via INTRAVENOUS

## 2021-03-25 MED ORDER — POTASSIUM CHLORIDE CRYS ER 20 MEQ PO TBCR
40.0000 meq | EXTENDED_RELEASE_TABLET | Freq: Once | ORAL | Status: AC
  Administered 2021-03-25: 03:00:00 40 meq via ORAL
  Filled 2021-03-25: qty 2

## 2021-03-25 MED ORDER — POTASSIUM CHLORIDE CRYS ER 20 MEQ PO TBCR
20.0000 meq | EXTENDED_RELEASE_TABLET | Freq: Once | ORAL | Status: AC
  Administered 2021-03-25: 22:00:00 20 meq via ORAL
  Filled 2021-03-25: qty 1

## 2021-03-25 MED ORDER — ALBUMIN HUMAN 25 % IV SOLN
25.0000 g | Freq: Four times a day (QID) | INTRAVENOUS | Status: AC
  Administered 2021-03-25 (×4): 25 g via INTRAVENOUS
  Filled 2021-03-25 (×4): qty 100

## 2021-03-25 NOTE — Progress Notes (Signed)
NAME:  Jonathon York, MRN:  [id number], DOB:  [birth year], LOS: 4 ADMISSION DATE:  03/21/2021, CONSULTATION DATE:  03/21/2021 REFERRING MD:  Dr. Ellender Hose, CHIEF COMPLAINT:  Fall   Brief Pt Description / Synopsis:  45 y.o. Male with PMH of paraplegia of lower extremities, presented after falling with transfer, found after 4 days.  Admitted with Severe Sepsis due to UTI and infected decubitus wounds.  History of Present Illness:  Jonathon York is a 45 year old male with a history of paraplegia of the lower extremities, chronic sacral decubitus ulcers, neurogenic bladder status post suprapubic catheter who presented to [facility] ED on 03/21/2021 after being found down in the floor at home.    The patient reports that he fell 4 days ago when transitioning himself in his house, and he has since remained in the floor, unable to get himself up. He denies any head trauma or LOC from the fall.  His daughter normally checks on him but was away for a few days, and when she checked on him today he was found in the floor.  He was found grossly soiled with dried fecal matter on his back and wounds.  He reports chills, fatigue, weakness, and aches.  He also reports that has been greater than 7 days since he last ate.  He denies any head trauma or loss of consciousness from the fall.  03/22/21- patient is stable on room air. He is not on pressors. S/p Surgery evaluation.  Palliative care consultation ordered due to poor prognosis with large wounds unlikely to heal. Psychiatry evaluation due to clinical signs of depression.    03/23/21- patient is going into refeeding syndrome with severe electrolyte derrangement post introduction of nourishment. His surgery has been postponed.  Repletion in progress.  03/24/21- patient has improved electrolytes and is being seen for consideration to OR for debridement of infected ischial wounds.  03/25/21- patient is improved , will optimize for TRH transfer.    Pertinent  Medical  History  Paraplegia of bilateral lower extremities Neurogenic bladder requiring Suprapubic catheter Osteomyelitis Decubitus Ulcers Anxiety Depression  Micro Data:  03/21/2021: SARS-CoV-2 & Influenza PCR>>negative 03/21/2021: Blood culture x2>> 03/21/2021: Urine >>  Antimicrobials:  Flagyl 11/23 x1 dose Cefepime 11/23 x1 dose Vancomycin 11/23>> Zosyn 11/23>>   Objective   Blood pressure 101/68, pulse 95, temperature 98.5 F (36.9 C), temperature source Oral, resp. rate 17, height 5\' 10"  (1.778 m), weight 42 kg, SpO2 99 %.        Intake/Output Summary (Last 24 hours) at 03/25/2021 1052 Last data filed at 03/25/2021 1021 Gross per 24 hour  Intake 3036.46 ml  Output 4400 ml  Net -1363.54 ml    Filed Weights   03/23/21 0500 03/24/21 0500 03/25/21 0500  Weight: 42.6 kg 42.6 kg 42 kg    Examination: General: Cachectic, chronically ill appearing male, laying in bed, in NAD HENT: Atraumatic, normocephalic, neck supple, no JVD.  Dry mucus membranes Lungs: Clear breath sounds throughout, mild tachypnea, even Cardiovascular: Tachycardia, regular rhythm, s1s2, No M/R/G Abdomen: Soft, nontender, nondistended, no guarding or rebound tenderness, BS+ x4 Extremities: paraplegia of bilateral LE (baseline) Neuro: Awake and alert, oriented to person, place, time GU: Suprapubic catheter in place draining cloudy urine Skin: See images below.     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     -MVT    -RD evaluation     - pharmacy consultation -electrlytes  Tachycardia due to sepsis & Dehydration -resolved   Acute Kidney Injury-RESOLVED    Anemia without overt s/sx of bleeding Thrombocytopenia, suspect due to sepsis -Monitor for S/Sx of bleeding -Trend CBC -SCD's for VTE Prophylaxis (will hold chemical prophylaxis for now given anemia) -Transfuse for Hgb <7 -To receive 1 unit pRBC's 11/23    Best Practice (right click and "Reselect all SmartList Selections" daily)   Diet/type: Regular consistency (see orders) DVT prophylaxis: SCD GI prophylaxis: N/A Lines: N/A Foley:  N/A, Chronic suprapubic catheter Code Status:  full code Last date of multidisciplinary goals of care discussion [N/A]  Labs   CBC: Recent Labs  Lab 03/21/21 1950 03/21/21 2153 03/22/21 0414 03/23/21 0542 03/24/21 0536 03/25/21 0105  WBC 10.4  --  9.9 7.8 11.3* 11.3*  NEUTROABS 9.1*  --   --   --   --   --   HGB 7.1* 6.3* 9.6* 8.6* 7.1* 7.0*  HCT 22.2* 20.0* 30.5* 27.7* 23.1* 22.7*  MCV 63.6*  --  67.9* 68.6* 69.6* 71.6*  PLT 104*  --  97* 80* 78* 83*     Basic Metabolic Panel: Recent Labs  Lab 03/22/21 0414 03/23/21 0542 03/23/21 0750 03/23/21 1159 03/23/21 2134 03/24/21 0536 03/25/21 0105 03/25/21 0904  NA 141 141  --  140  --  138 137  --   K 3.5 2.3*   < > 3.3* 3.0* 3.4* 2.5* 4.0  CL 107 105  --  104  --  110 107  --   CO2 20* 28  --  24  --  23 23  --   GLUCOSE 89 97  --  113*  --  119* 159*  --   BUN 82* 19  --  17  --  11 6  --   CREATININE 0.99 0.45*  --  0.63  --  0.30* 0.41*  --   CALCIUM 7.3* 7.0*  --  7.1*  --  7.1* 7.0*  --   MG  --   2.3  --   --   --  2.4 2.0  --   PHOS  --  2.5  --   --   --  2.3* 2.5  --    < > = values in this interval not displayed.    GFR: Estimated Creatinine Clearance: 69.3 mL/min (A) (by C-G formula based on SCr of 0.41 mg/dL (L)). Recent Labs  Lab 03/21/21 1834 03/21/21 1950 03/21/21 1950 03/21/21 2049 03/22/21 0414 03/23/21 0542 03/24/21 0536 03/25/21 0105  PROCALCITON 85.62  --   --   --  36.94 9.21  --   --   WBC  --  10.4   < >  --  9.9 7.8 11.3* 11.3*  LATICACIDVEN  --  1.5  --  1.0  --   --   --   --    < > = values in this interval not displayed.     Liver Function Tests: Recent Labs  Lab 03/21/21 1950 03/23/21 1159 03/24/21 0536 03/25/21 0105  AST 28 38  --   --   ALT 21 20  --   --   ALKPHOS 69 60  --   --   BILITOT 1.4* 0.8  --   --   PROT 6.0* 5.5*  --   --   ALBUMIN 1.8* 1.7* 2.0* 2.4*    No results for input(s): LIPASE, AMYLASE in the last 168 hours. No results for input(s): AMMONIA in the last 168 hours.  ABG No results found for: PHART, PCO2ART, PO2ART, HCO3, TCO2, ACIDBASEDEF, O2SAT   Coagulation Profile: Recent Labs  Lab 03/21/21 1950 03/23/21 0542  INR 1.5* 1.4*     Cardiac Enzymes: Recent Labs  Lab 03/21/21 1950 03/22/21 0414  CKTOTAL 259 161     HbA1C: No results found for: HGBA1C  CBG: Recent Labs  Lab 03/22/21 0321  GLUCAP 76     Review of Systems:   Positives in BOLD: Gen: Denies fever, chills, weight change, fatigue, night sweats HEENT: Denies blurred vision, double vision, hearing loss, tinnitus, sinus congestion, rhinorrhea, sore throat, neck stiffness, dysphagia PULM: Denies shortness of breath, cough, sputum production, hemoptysis, wheezing CV: Denies chest pain, edema, orthopnea, paroxysmal nocturnal dyspnea, palpitations GI: Denies abdominal pain, nausea, vomiting, diarrhea, hematochezia, melena, constipation, change in bowel habits GU: Denies dysuria, hematuria, polyuria, oliguria, urethral  discharge Endocrine: Denies hot or cold intolerance, polyuria, polyphagia or appetite change Derm: Denies rash, dry skin, scaling or peeling skin change Heme: Denies easy bruising, bleeding, bleeding gums Neuro: Denies headache, numbness, weakness, slurred speech, loss of memory or consciousness   Past Medical History:  He,  has a past medical history of Anxiety, Decubitus ulcer, Depression, Neurogenic bladder, Osteomyelitis ([place]), and Paraparesis of both lower limbs ([place]) (02/21/00).   Surgical History:   Past Surgical History:  Procedure Laterality Date   AMPUTATION Right 03/19/2018   Procedure: 5th Metatarsal Resection;  Surgeon: Albertine Patricia, DPM;  Location: [facility] ORS;  Service: Podiatry;  Laterality: Right;   AMPUTATION TOE Right 04/29/2015   Procedure: AMPUTATION TOE;  Surgeon: Sharlotte Alamo, MD;  Location: [facility] ORS;  Service: Podiatry;  Laterality: Right;   INCISION AND DRAINAGE ABSCESS N/A 03/24/2021   Procedure: INCISION AND DRAINAGE STAGE IV DECUBITUS ULCER;  Surgeon: Herbert Pun, MD;  Location: [facility] ORS;  Service: General;  Laterality: N/A;   IRRIGATION AND DEBRIDEMENT BUTTOCKS     TOE AMPUTATION Right      Social History:   reports that he quit smoking about 4 years ago. His smoking use included cigarettes. He smoked an average of .5 packs per day. He has never used smokeless tobacco. He reports current drug use. Drug: Marijuana. He reports that he does not drink alcohol.   Family History:  His family history includes Lymphoma in his sister.   Allergies Allergies  Allergen Reactions   Orange Fruit [Citrus] Other (See Comments)    Blisters only from [place]!!! Patient is not allergic from all citrus   Sulfa Antibiotics     Throwing up blood      Home Medications  Prior to Admission medications   Medication Sig Start Date End Date Taking? Authorizing Provider  sodium hypochlorite (DAKIN'S 1/4 STRENGTH) 0.125 % SOLN For 7 days Moisten gauze with 1/4%  Dakins  and pack right ischial wound Patient not taking: Reported on 03/21/2021 02/12/21   Standley Brooking, MD     Critical care provider statement:   Total critical care time: 33 minutes   Performed by: Karna Christmas MD   Critical care time was exclusive of separately billable procedures and treating other patients.   Critical care was necessary to treat or prevent imminent or life-threatening deterioration.   Critical care was time spent personally by me on the following activities: development of treatment plan with patient and/or surrogate as well as nursing, discussions with consultants, evaluation of patient's response to treatment, examination of patient, obtaining history from patient or surrogate, ordering and performing treatments and interventions, ordering and review of laboratory studies, ordering and review of radiographic studies, pulse oximetry and re-evaluation of patient's condition.    Vida Rigger, M.D.  Pulmonary & Critical Care Medicine

## 2021-03-25 NOTE — Progress Notes (Signed)
Attempted to give patient a bath and do his wound care/dressing changes. Patient declined at this time. Patient states he is not refusing but would like to postpone until later this evening due to wanting to watch the football game. Informed patient that I could return later in the day to attempt again and he was agreeable to plan.

## 2021-03-25 NOTE — Progress Notes (Signed)
PHARMACY CONSULT NOTE - FOLLOW UP  Pharmacy Consult for Electrolyte Monitoring and Replacement   Recent Labs: Potassium (mmol/L)  Date Value  03/25/2021 2.5 (LL)  12/25/2013 4.0   Magnesium (mg/dL)  Date Value  37/85/8850 2.0  12/25/2013 2.1   Calcium (mg/dL)  Date Value  27/74/1287 7.0 (L)   Calcium, Total (mg/dL)  Date Value  86/76/7209 8.4 (L)   Albumin (g/dL)  Date Value  47/12/6281 2.4 (L)  06/10/2012 2.8 (L)   Phosphorus (mg/dL)  Date Value  66/29/4765 2.5   Sodium (mmol/L)  Date Value  03/25/2021 137  12/25/2013 142     Assessment: 45 y.o. Male with PMH of paraplegia of lower extremities, presented after falling with transfer, found after 4 days.  Admitted with Severe Sepsis due to UTI and infected decubitus wounds.  LR@100ml /h   Goal of Therapy:  Electrolytes WNL   Plan:  Medical team gave KCL 10 mEq IV x 4. Will give KCL 40 mEq x 1 in the PM.  D/c'ed Kphos PO 500 mg TID.   F/u with AM labs.   Ronnald Ramp ,PharmD Clinical Pharmacist 03/25/2021 9:04 AM

## 2021-03-26 ENCOUNTER — Inpatient Hospital Stay: Payer: Self-pay

## 2021-03-26 ENCOUNTER — Encounter: Payer: Self-pay | Admitting: Pulmonary Disease

## 2021-03-26 DIAGNOSIS — Z7189 Other specified counseling: Secondary | ICD-10-CM

## 2021-03-26 DIAGNOSIS — D649 Anemia, unspecified: Secondary | ICD-10-CM

## 2021-03-26 DIAGNOSIS — A419 Sepsis, unspecified organism: Secondary | ICD-10-CM | POA: Diagnosis not present

## 2021-03-26 DIAGNOSIS — L089 Local infection of the skin and subcutaneous tissue, unspecified: Secondary | ICD-10-CM | POA: Diagnosis not present

## 2021-03-26 DIAGNOSIS — Z515 Encounter for palliative care: Secondary | ICD-10-CM

## 2021-03-26 DIAGNOSIS — R7881 Bacteremia: Secondary | ICD-10-CM

## 2021-03-26 DIAGNOSIS — N179 Acute kidney failure, unspecified: Secondary | ICD-10-CM

## 2021-03-26 DIAGNOSIS — L8995 Pressure ulcer of unspecified site, unstageable: Secondary | ICD-10-CM

## 2021-03-26 DIAGNOSIS — L89324 Pressure ulcer of left buttock, stage 4: Secondary | ICD-10-CM

## 2021-03-26 LAB — CBC
HCT: 22.7 % — ABNORMAL LOW (ref 39.0–52.0)
Hemoglobin: 6.9 g/dL — ABNORMAL LOW (ref 13.0–17.0)
MCH: 22 pg — ABNORMAL LOW (ref 26.0–34.0)
MCHC: 30.4 g/dL (ref 30.0–36.0)
MCV: 72.3 fL — ABNORMAL LOW (ref 80.0–100.0)
Platelets: 85 10*3/uL — ABNORMAL LOW (ref 150–400)
RBC: 3.14 MIL/uL — ABNORMAL LOW (ref 4.22–5.81)
RDW: 24.2 % — ABNORMAL HIGH (ref 11.5–15.5)
WBC: 8.1 10*3/uL (ref 4.0–10.5)
nRBC: 0 % (ref 0.0–0.2)

## 2021-03-26 LAB — BASIC METABOLIC PANEL
Anion gap: 6 (ref 5–15)
BUN: <5 mg/dL — ABNORMAL LOW (ref 6–20)
CO2: 25 mmol/L (ref 22–32)
Calcium: 7.9 mg/dL — ABNORMAL LOW (ref 8.9–10.3)
Chloride: 108 mmol/L (ref 98–111)
Creatinine, Ser: <0.3 mg/dL — ABNORMAL LOW (ref 0.61–1.24)
Glucose, Bld: 90 mg/dL (ref 70–99)
Potassium: 4.6 mmol/L (ref 3.5–5.1)
Sodium: 139 mmol/L (ref 135–145)

## 2021-03-26 LAB — HEMOGLOBIN AND HEMATOCRIT, BLOOD
HCT: 24 % — ABNORMAL LOW (ref 39.0–52.0)
Hemoglobin: 7.5 g/dL — ABNORMAL LOW (ref 13.0–17.0)

## 2021-03-26 LAB — PREPARE RBC (CROSSMATCH)

## 2021-03-26 LAB — CULTURE, BLOOD (ROUTINE X 2)
Culture: NO GROWTH
Special Requests: ADEQUATE

## 2021-03-26 LAB — MAGNESIUM: Magnesium: 2.1 mg/dL (ref 1.7–2.4)

## 2021-03-26 LAB — PHOSPHORUS: Phosphorus: 3.5 mg/dL (ref 2.5–4.6)

## 2021-03-26 MED ORDER — PIPERACILLIN-TAZOBACTAM 3.375 G IVPB
3.3750 g | Freq: Three times a day (TID) | INTRAVENOUS | Status: DC
  Administered 2021-03-27 – 2021-04-01 (×16): 3.375 g via INTRAVENOUS
  Filled 2021-03-26 (×14): qty 50

## 2021-03-26 MED ORDER — ACETAMINOPHEN 325 MG PO TABS: 650.0000 mg | ORAL_TABLET | Freq: Four times a day (QID) | ORAL | Status: DC | PRN

## 2021-03-26 MED ORDER — SODIUM CHLORIDE 0.9% FLUSH: 10.0000 mL | INTRAVENOUS | Status: DC | PRN

## 2021-03-26 MED ORDER — HYDROCODONE-ACETAMINOPHEN 5-325 MG PO TABS
1.0000 | ORAL_TABLET | Freq: Four times a day (QID) | ORAL | Status: DC | PRN
Start: 2021-03-26 — End: 2021-04-02
  Administered 2021-03-26 – 2021-04-02 (×11): 2 via ORAL
  Filled 2021-03-26 (×13): qty 2

## 2021-03-26 MED ORDER — SODIUM CHLORIDE 0.9% FLUSH
10.0000 mL | Freq: Two times a day (BID) | INTRAVENOUS | Status: DC
  Administered 2021-03-26 – 2021-03-27 (×3): 10 mL
  Administered 2021-03-28: 30 mL
  Administered 2021-03-28 – 2021-03-30 (×4): 10 mL
  Administered 2021-03-30: 20 mL
  Administered 2021-03-31: 10 mL
  Administered 2021-04-01: 23:00:00 20 mL
  Administered 2021-04-01 – 2021-04-02 (×2): 10 mL

## 2021-03-26 MED ORDER — SODIUM CHLORIDE 0.9% IV SOLUTION: Freq: Once | INTRAVENOUS | Status: DC

## 2021-03-26 MED ORDER — HYDROMORPHONE HCL 2 MG PO TABS
1.0000 mg | ORAL_TABLET | Freq: Four times a day (QID) | ORAL | Status: DC | PRN
  Administered 2021-03-26 – 2021-03-31 (×14): 1 mg via ORAL
  Filled 2021-03-26 (×15): qty 1

## 2021-03-26 NOTE — Progress Notes (Signed)
PROGRESS NOTE  Jonathon York    DOB: 1975-12-06, 45 y.o.  PPJ:[id number]  PCP: Mick Sell, MD   Code Status: Full Code   DOA: 03/21/2021   LOS: 5  Brief Narrative of Current Hospitalization  Jonathon York is a 45 y.o. male with a PMH significant for paraplegia, chronic sacral decubitus ulcers, neurogenic bladder with suprapubic catheter. They presented from home to the ED on 03/21/2021 after fall and being on ground for 4 days prior to being found. He states he drank his own urine during this time. In the ED, it was found that they were soiled, dehydrated, meeting sepsis criteria from wound infection of his chronic sacral uclers. They were treated with IV fluids and antibiotics.  Patient was admitted to medicine service for further workup and management of sepsis as outlined in detail below.  03/26/21 -stable, improved  Assessment & Plan  Principal Problem:   Sepsis (HCC)  Sepsis- criteria have resolved. He now has stable vital signs, afebrile with persistent sinus tachycardia (asymptomatic). Blood culture 11/23 had contaminant but otherwise NGTD. Urine Cx showed morganella morganii with mixed organisms. Levophed discontinued 11/27 - suprapubic cath replaced - wound care - discontinue IV fluids as he is taking PO now - close monitoring of vitals  Severe sacral/buttocks/hips decubitus ulcers- s/p debridement 11/26 - continue IV Abx, PICC line today  - CTX 11/ - SNF discharge for advanced wound care and deconditioning - PT/OT - surgery continues to follow, appreciate recs  - will need care at tertiary center via plastic surgery and other resources for further advanced treatment of wounds - analgesia PRN. Discontinued morphine as can contribute to hypotension.  Refeeding syndrome  severe protein calorie malnutrition-  - monitor and replace electrolytes PRN - RD consult, appreciate recs  Anxiety/depression - psychiatry was consulted - continue  AKI- resolved -  BMP am  Anemia  thrombocytopenia- s/p blood transfusion 11/23. Hgb 6.9 this am - blood transfusion in process 1unit - post transfusion and am CBC  DVT prophylaxis: SCDs Start: 03/21/21 2240   Diet:  Diet Orders (From admission, onward)     Start     Ordered   03/23/21 1303  Diet regular Room service appropriate? Yes; Fluid consistency: Thin  Diet effective now       Question Answer Comment  Room service appropriate? Yes   Fluid consistency: Thin      03/23/21 1302            Subjective 03/26/21    Pt reports still being "shook" from his experience. Endorses moderately well controlled pain. He is in agreement that he will need SNF level care at dc.  Disposition Plan & Communication  Patient status: Inpatient  Admitted From: Home Disposition: Skilled nursing facility Anticipated discharge date: 11/30  Family Communication: none  Consults, Procedures, Significant Events  Consultants:  CCM General surgery Psych   Procedures/significant events:  Debridement 11/26  Antimicrobials:  Anti-infectives (From admission, onward)    Start     Dose/Rate Route Frequency Ordered Stop   03/25/21 1000  cefTRIAXone (ROCEPHIN) 2 g in sodium chloride 0.9 % 100 mL IVPB        2 g 200 mL/hr over 30 Minutes Intravenous Every 24 hours 03/24/21 1510     03/24/21 2000  vancomycin (VANCOCIN) IVPB 1000 mg/200 mL premix  Status:  Discontinued        1,000 mg 200 mL/hr over 60 Minutes Intravenous Every 24 hours 03/24/21 0858 03/24/21 1026  03/24/21 2000  vancomycin (VANCOREADY) IVPB 1000 mg/200 mL  Status:  Discontinued        1,000 mg 200 mL/hr over 60 Minutes Intravenous Every 24 hours 03/24/21 1026 03/25/21 0902   03/22/21 2000  vancomycin (VANCOREADY) IVPB 1250 mg/250 mL  Status:  Discontinued        1,250 mg 166.7 mL/hr over 90 Minutes Intravenous Every 24 hours 03/22/21 1159 03/24/21 0858   03/22/21 0600  piperacillin-tazobactam (ZOSYN) IVPB 3.375 g        3.375 g 12.5 mL/hr  over 240 Minutes Intravenous Every 8 hours 03/21/21 2216 03/24/21 2359   03/21/21 2217  vancomycin variable dose per unstable renal function (pharmacist dosing)  Status:  Discontinued         Does not apply See admin instructions 03/21/21 2217 03/24/21 0859   03/21/21 1900  ceFEPIme (MAXIPIME) 2 g in sodium chloride 0.9 % 100 mL IVPB        2 g 200 mL/hr over 30 Minutes Intravenous  Once 03/21/21 1851 03/21/21 2122   03/21/21 1900  metroNIDAZOLE (FLAGYL) IVPB 500 mg        500 mg 100 mL/hr over 60 Minutes Intravenous  Once 03/21/21 1851 03/21/21 2027   03/21/21 1900  vancomycin (VANCOCIN) IVPB 1000 mg/200 mL premix        1,000 mg 200 mL/hr over 60 Minutes Intravenous  Once 03/21/21 1851 03/21/21 2151       Objective   Vitals:   03/26/21 0300 03/26/21 0400 03/26/21 0500 03/26/21 0600  BP: (!) 104/59 98/65    Pulse: (!) 109 (!) 110 (!) 107 (!) 104  Resp: (!) 21 11 15  (!) 22  Temp:      TempSrc:      SpO2: 98% 99% 100% 100%  Weight:      Height:        Intake/Output Summary (Last 24 hours) at 03/26/2021 0720 Last data filed at 03/26/2021 0500 Gross per 24 hour  Intake 1444.67 ml  Output 5125 ml  Net -3680.33 ml   Filed Weights   03/23/21 0500 03/24/21 0500 03/25/21 0500  Weight: 42.6 kg 42.6 kg 42 kg    Patient BMI: Body mass index is 13.29 kg/m.   Physical Exam: General: awake, alert, NAD HEENT: atraumatic, clear conjunctiva, anicteric sclera, moist mucus membranes, hearing grossly normal Respiratory: normal respiratory effort. Cardiovascular: quick capillary refill  Nervous: A&O x3. no gross focal neurologic deficits, normal speech Extremities: moves UE equally. Lower extremities low muscle tone Skin: dry, intact, normal temperature, normal color on exposed skin. Lesions on hips/buttocks= please see clinical images in surgery note for further detail.  Psychiatry: anxious mood, congruent affect  Labs   I have personally reviewed following labs and imaging  studies CBC    Component Value Date/Time   WBC 8.1 03/26/2021 0601   RBC 3.14 (L) 03/26/2021 0601   HGB 6.9 (L) 03/26/2021 0601   HGB 8.6 (L) 12/25/2013 0542   HCT 22.7 (L) 03/26/2021 0601   HCT 28.3 (L) 12/25/2013 0542   PLT 85 (L) 03/26/2021 0601   PLT 488 (H) 12/30/2013 0518   MCV 72.3 (L) 03/26/2021 0601   MCV 73 (L) 12/25/2013 0542   MCH 22.0 (L) 03/26/2021 0601   MCHC 30.4 03/26/2021 0601   RDW 24.2 (H) 03/26/2021 0601   RDW 18.0 (H) 12/25/2013 0542   LYMPHSABS 0.9 03/21/2021 1950   LYMPHSABS 1.2 12/25/2013 0542   MONOABS 0.3 03/21/2021 1950   MONOABS 0.5 12/25/2013 0542  EOSABS 0.0 03/21/2021 1950   EOSABS 0.3 12/25/2013 0542   BASOSABS 0.0 03/21/2021 1950   BASOSABS 0.1 12/25/2013 0542   BMP Latest Ref Rng & Units 03/26/2021 03/25/2021 03/25/2021  Glucose 70 - 99 mg/dL 90 - -  BUN 6 - 20 mg/dL <5(G) - -  Creatinine 2.56 - 1.24 mg/dL <3.89(H) - -  Sodium 734 - 145 mmol/L 139 - -  Potassium 3.5 - 5.1 mmol/L 4.6 3.9 4.0  Chloride 98 - 111 mmol/L 108 - -  CO2 22 - 32 mmol/L 25 - -  Calcium 8.9 - 10.3 mg/dL 7.9(L) - -   Imaging Studies  [place] EKG SITE RITE  Result Date: 03/26/2021 If Site Rite image not attached, placement could not be confirmed due to current cardiac rhythm.  Medications   Scheduled Meds:  sodium chloride   Intravenous Once   vitamin C  500 mg Oral BID   Chlorhexidine Gluconate Cloth  6 each Topical Daily   collagenase   Topical BID   feeding supplement  237 mL Oral TID BM   multivitamin-lutein  1 capsule Oral Daily   sodium hypochlorite   Irrigation BID   No recently discontinued medications to reconcile  LOS: 5 days   Time spent: >1min  Leeroy Bock, DO [facility] 03/26/2021, 7:20 AM   Please refer to amion to contact the [facility] Attending or Consulting provider for this pt  [email or web address] Available by Epic secure chat 7AM-7PM. If 7PM-7AM, please contact night-coverage

## 2021-03-26 NOTE — Consult Note (Signed)
Consultation Note Date: 03/26/2021   Patient Name: Jonathon York  DOB: June 14, 1975  MRN: [id number]  Age / Sex: 45 y.o., male  PCP: Mick Sell, MD Referring Physician: Leeroy Bock, MD  Reason for Consultation: Establishing goals of care  HPI/Patient Profile: 45 y.o. 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   Clinical Assessment and Goals of Care: I have reviewed medical records including EPIC notes, labs and imaging, received report from RN, assessed the patient.  Jonathon York is lying quietly in bed.  He greets me, making and mostly keeping eye contact.  He is alert and oriented, able to make his needs known.  There is no family at bedside at this time.    We meet to discuss diagnosis prognosis, GOC, EOL wishes, disposition and options.  I introduced Palliative Medicine as specialized medical care for people living with serious illness. It focuses on providing relief from the symptoms and stress of a serious illness. The goal is to improve quality of life for both the patient and the family.  We discussed a brief life review of the patient.  Jonathon York tells me that he fell from a roof while working over 20 years ago.  He shares that he has been living independently, his daughter Therapist, art) Jonathon York checks on him frequently.  He shares that he is able to essentially meet his own needs.    We then focused on their current illness.  Several times during our conversation, Jonathon York shares that he thought he was going to die.  This has affected him greatly.  He request chaplaincy services for support.    We talked about his acute and chronic health concerns including, but not  limited to, his wounds, nutrition, need for PT eval and recommendations, and anticipated need for short-term rehab/LTC.  Jonathon York shares that he was scheduled to have a diverting colostomy, but this is now obviously on hold.  The natural disease trajectory and expectations at EOL were discussed.  Jonathon York that and I talked about going to short-term rehab.  He tells me that he believes he has 60 days of rehab covered at no cost under his [employer].  I share that transition of care team will work with him on rehab facilities and coverage.  We talked about what is next after rehab.  Jonathon York shares that he feels that he would need ALF.  We talked about the difference between ALF and LTC.  We initially talked about going home with the benefits of hospice for "treat the treatable" care.  Jonathon York shares that he does not believe that he would be able to return to his own home and live independently.  He tells me that he has houses "condemned".  Jonathon York is open to hospice care if this will benefit him, provide  more care.  He shares that his uncle had good care under hospice.  Advanced directives, concepts specific to code status, were considered and discussed.  At this point Jonathon York tells me that he would want attempted life support, but no more than 2 weeks.  He shares that if he is not improving, he would not want to continue in his current state.  Discussed the importance of continued conversation with family and the medical providers regarding overall plan of care and treatment options, ensuring decisions are within the context of the patient's values and GOCs.  Questions and concerns were addressed.   The family was encouraged to call with questions or concerns.  PMT will continue to support holistically.  Conference with attending, bedside nursing staff, transition of care team related to patient condition, needs, goals of care, disposition.   HCPOA  NEXT OF KIN -Jonathon York names his  daughter, (Jonathon York) Jonathon York as his healthcare surrogate.  He is unmarried.  He tells me that he has 2 sons, but would like for his daughter Jonathon York to be his surrogate Management consultant.    SUMMARY OF RECOMMENDATIONS   At this point continue to treat the treatable Agreeable to short-term rehab Anticipate need for long-term care PMT to discuss outpatient palliative services on next meeting.   Code Status/Advance Care Planning: Full code -set limits of no trach, no more than 10 days of life support.  I encouraged him to share this decision with his daughter/healthcare surrogate.  Symptom Management:  Per hospitalist, no additional needs at this time.  Palliative Prophylaxis:  Frequent Pain Assessment, Oral Care, and Turn Reposition  Additional Recommendations (Limitations, Scope, Preferences): Full Scope Treatment  Psycho-social/Spiritual:  Desire for further Chaplaincy support:yes Additional Recommendations: Caregiving  Support/Resources and Referral to Community Resources   Prognosis:  Unable to determine, guarded at this point.  6 months or less could be possible based on acuity of condition, frailty, chronic wounds.  Discharge Planning:  To be determined.  Mr. Martine is open to short-term rehab.  He also tells me that he feels that he can no longer live independently.       Primary Diagnoses: Present on Admission:  Sepsis (HCC)   I have reviewed the medical record, interviewed the patient and family, and examined the patient. The following aspects are pertinent.  Past Medical History:  Diagnosis Date   AKI (acute kidney injury) (HCC)    Anxiety    Decubitus ulcer    Depression    Neurogenic bladder    Osteomyelitis (HCC)    Paraparesis of both lower limbs (HCC) 02/21/00   Social History   Socioeconomic History   Marital status: Single    Spouse name: Not on file   Number of children: Not on file   Years of education: Not on file   Highest education level:  Not on file  Occupational History   Occupation: disabled  Tobacco Use   Smoking status: Former    Packs/day: 0.50    Types: Cigarettes    Quit date: 2018    Years since quitting: 4.9   Smokeless tobacco: Never  Vaping Use   Vaping Use: Never used  Substance and Sexual Activity   Alcohol use: No   Drug use: Yes    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   Sexual activity: Never  Other Topics Concern   Not on file  Social History Narrative   Not on file   Social Determinants  of Health   Financial Resource Strain: Not on file  Food Insecurity: Not on file  Transportation Needs: Not on file  Physical Activity: Not on file  Stress: Not on file  Social Connections: Not on file   Family History  Problem Relation Age of Onset   Lymphoma Sister    Scheduled Meds:  sodium chloride   Intravenous Once   vitamin C  500 mg Oral BID   Chlorhexidine Gluconate Cloth  6 each Topical Daily   collagenase   Topical BID   feeding supplement  237 mL Oral TID BM   multivitamin-lutein  1 capsule Oral Daily   sodium chloride flush  10-40 mL Intracatheter Q12H   Continuous Infusions:  sodium chloride Stopped (03/23/21 2251)   cefTRIAXone (ROCEPHIN)  IV 2 g (03/26/21 1016)   PRN Meds:.acetaminophen, HYDROcodone-acetaminophen, HYDROmorphone, polyethylene glycol, traZODone Medications Prior to Admission:  Prior to Admission medications   Medication Sig Start Date End Date Taking? Authorizing Provider  sodium hypochlorite (DAKIN'S 1/4 STRENGTH) 0.125 % SOLN For 7 days Moisten gauze with 1/4% Dakins and pack right ischial wound Patient not taking: Reported on 03/21/2021 02/12/21   Standley Brooking, MD   Allergies  Allergen Reactions   Orange Fruit [Citrus] Other (See Comments)    Blisters only from [facility]!!! Patient is not allergic from all citrus   Sulfa Antibiotics     Throwing up blood    Review of Systems  Unable to perform ROS: Acuity of condition   Physical  Exam Vitals and nursing note reviewed.  Constitutional:      General: He is not in acute distress.    Appearance: He is ill-appearing.  HENT:     Head:     Comments: Some temporal wasting     Mouth/Throat:     Mouth: Mucous membranes are moist.  Cardiovascular:     Rate and Rhythm: Normal rate.  Pulmonary:     Effort: Pulmonary effort is normal. No respiratory distress.  Musculoskeletal:     Comments: Multiple wounds as noted in chart   Skin:    General: Skin is warm and dry.     Coloration: Skin is pale.  Neurological:     Mental Status: He is alert and oriented to person, place, and time.  Psychiatric:        Mood and Affect: Mood normal.        Behavior: Behavior normal.    Vital Signs: BP 95/68   Pulse (!) 122   Temp 98.4 F (36.9 C) (Oral)   Resp 18   Ht 5\' 10"  (1.778 m)   Wt 42 kg   SpO2 98%   BMI 13.29 kg/m  Pain Scale: 0-10   Pain Score: Asleep   SpO2: SpO2: 98 % O2 Device:SpO2: 98 % O2 Flow Rate: .   IO: Intake/output summary:  Intake/Output Summary (Last 24 hours) at 03/26/2021 1503 Last data filed at 03/26/2021 0600 Gross per 24 hour  Intake 1003.96 ml  Output 4200 ml  Net -3196.04 ml    LBM: Last BM Date: 03/25/21 Baseline Weight: Weight: 54.4 kg Most recent weight: Weight: 42 kg     Palliative Assessment/Data:   Flowsheet Rows    Flowsheet Row Most Recent Value  Intake Tab   Referral Department Hospitalist  Unit at Time of Referral Med/Surg Unit  Palliative Care Primary Diagnosis Sepsis/Infectious Disease  Date Notified 03/22/21  Palliative Care Type New Palliative care  Reason for referral Clarify Goals of Care  Date of Admission 03/21/21  Date first seen by Palliative Care 03/26/21  # of days Palliative referral response time 4 Day(s)  # of days IP prior to Palliative referral 1  Clinical Assessment   Palliative Performance Scale Score 30%  Pain Max last 24 hours Not able to report  Pain Min Last 24 hours Not able to report   Dyspnea Max Last 24 Hours Not able to report  Dyspnea Min Last 24 hours Not able to report  Psychosocial & Spiritual Assessment   Palliative Care Outcomes        Time In: 1350 Time Out: 1500 Time Total: 70 minutes  Greater than 50%  of this time was spent counseling and coordinating care related to the above assessment and plan.  Signed by: Katheran Awe, NP   Please contact Palliative Medicine Team phone at [phone] for questions and concerns.  For individual provider: See Loretha Stapler

## 2021-03-26 NOTE — Progress Notes (Signed)
PT Cancellation Note  Patient Details Name: Jonathon York MRN: [id number] DOB: October 22, 1975   Cancelled Treatment:    Reason Eval/Treat Not Completed: Other (comment) PT orders received, chart reviewed. Per chart, pt with low Hgb of 6.9 & exertional activity contraindicated. Will f/u as able to determine pt's level of appropriateness.  Aleda Grana, PT, [name] 03/26/21, 8:17 AM    Jonathon York 03/26/2021, 8:16 AM

## 2021-03-26 NOTE — Consult Note (Signed)
NAME: Jonathon York  DOB: Jun 30, 1975  MRN: [id number]  Date/Time: 03/26/2021 5:52 PM  REQUESTING PROVIDER: Deniece Portela Subjective:  REASON FOR CONSULT: sacral wounds ? Jonathon York is a 45 y.o. male with a history of paraplegia,  decubitus ulcers, neurogenic bladder wih SPC presents from home after being on the floor for 4 days. As per patient he fell off his bed and lay on the floor for 4 days. His daughter who does not live with him saw him on the floor and called EMS on 03/21/21 and he was brought to the ED. In the ED temp 94.7, RR 33, Pulse 97 and BP 80/58.  Labs revealed NA 134, Hco3 16, BUN 123 and cr 1.6, CK 59 , lactate 1.5, WBC 10.4, HB 7.1 and plt 104 Blood culture sent- UA showed > 50 wbc and culture sent and he was started on Vanco/cefepime and flagyl HE was admitted to ICU HE was seen by surgeon for the decubitus wounds on his hip area and back and it was contaminated with stool- HE was taken from debridement of necrotic tissue on 03/24/21 As blood culture came back positive for step anginosis the antibiotcs were deescalated to ceftriaxone I am asked to see the patient for antibiotic management    Past Medical History:  Diagnosis Date   AKI (acute kidney injury) ([place])    Anxiety    Decubitus ulcer    Depression    Neurogenic bladder    Osteomyelitis ([place])    Paraparesis of both lower limbs ([place]) 02/21/00    Past Surgical History:  Procedure Laterality Date   AMPUTATION Right 03/19/2018   Procedure: 5th Metatarsal Resection;  Surgeon: Albertine Patricia, DPM;  Location: [facility] ORS;  Service: Podiatry;  Laterality: Right;   AMPUTATION TOE Right 04/29/2015   Procedure: AMPUTATION TOE;  Surgeon: Sharlotte Alamo, MD;  Location: [facility] ORS;  Service: Podiatry;  Laterality: Right;   INCISION AND DRAINAGE ABSCESS N/A 03/24/2021   Procedure: INCISION AND DRAINAGE STAGE IV DECUBITUS ULCER;  Surgeon: Herbert Pun, MD;  Location: [facility] ORS;  Service: General;  Laterality: N/A;    IRRIGATION AND DEBRIDEMENT BUTTOCKS     TOE AMPUTATION Right     Social History   Socioeconomic History   Marital status: Single    Spouse name: Not on file   Number of children: Not on file   Years of education: Not on file   Highest education level: Not on file  Occupational History   Occupation: disabled  Tobacco Use   Smoking status: Former    Packs/day: 0.50    Types: Cigarettes    Quit date: 2018    Years since quitting: 4.9   Smokeless tobacco: Never  Vaping Use   Vaping Use: Never used  Substance and Sexual Activity   Alcohol use: No   Drug use: Yes    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   Sexual activity: Never  Other Topics Concern   Not on file  Social History Narrative   Not on file   Social Determinants of Health   Financial Resource Strain: Not on file  Food Insecurity: Not on file  Transportation Needs: Not on file  Physical Activity: Not on file  Stress: Not on file  Social Connections: Not on file  Intimate Partner Violence: Not on file    Family History  Problem Relation Age of Onset   Lymphoma Sister    Allergies  Allergen Reactions   Orange Fruit [Citrus]  Other (See Comments)    Blisters only from ORANGES!!! Patient is not allergic from all citrus   Sulfa Antibiotics     Throwing up blood    I? Current Facility-Administered Medications  Medication Dose Route Frequency Provider Last Rate Last Admin   0.9 %  sodium chloride infusion (Manually program via Guardrails IV Fluids)   Intravenous Once [place], Micheline Rough L, NP       0.9 %  sodium chloride infusion  250 mL Intravenous Continuous Carolan Shiver, MD   Held at 03/23/21 2251   acetaminophen (TYLENOL) tablet 650 mg  650 mg Oral Q6H PRN Leeroy Bock, MD       ascorbic acid (VITAMIN C) tablet 500 mg  500 mg Oral BID Carolan Shiver, MD   500 mg at 03/26/21 1016   cefTRIAXone (ROCEPHIN) 2 g in sodium chloride 0.9 % 100 mL IVPB  2 g Intravenous Q24H  Vida Rigger, MD 200 mL/hr at 03/26/21 1016 2 g at 03/26/21 1016   Chlorhexidine Gluconate Cloth 2 % PADS 6 each  6 each Topical Daily Carolan Shiver, MD   6 each at 03/26/21 1016   collagenase (SANTYL) ointment   Topical BID Carolan Shiver, MD   Given at 03/26/21 1017   feeding supplement (ENSURE ENLIVE / ENSURE PLUS) liquid 237 mL  237 mL Oral TID BM Carolan Shiver, MD   237 mL at 03/26/21 1410   HYDROcodone-acetaminophen (NORCO/VICODIN) 5-325 MG per tablet 1-2 tablet  1-2 tablet Oral Q6H PRN Leeroy Bock, MD       HYDROmorphone (DILAUDID) tablet 1 mg  1 mg Oral Q6H PRN Leeroy Bock, MD   1 mg at 03/26/21 1539   multivitamin-lutein (OCUVITE-LUTEIN) capsule 1 capsule  1 capsule Oral Daily Carolan Shiver, MD   1 capsule at 03/26/21 1230   polyethylene glycol (MIRALAX / GLYCOLAX) packet 17 g  17 g Oral Daily PRN Carolan Shiver, MD       sodium chloride flush (NS) 0.9 % injection 10-40 mL  10-40 mL Intracatheter Q12H Carolan Shiver, MD   10 mL at 03/26/21 1017   traZODone (DESYREL) tablet 50 mg  50 mg Oral QHS PRN Carolan Shiver, MD   50 mg at 03/24/21 0030     Abtx:  Anti-infectives (From admission, onward)    Start     Dose/Rate Route Frequency Ordered Stop   03/25/21 1000  cefTRIAXone (ROCEPHIN) 2 g in sodium chloride 0.9 % 100 mL IVPB        2 g 200 mL/hr over 30 Minutes Intravenous Every 24 hours 03/24/21 1510     03/24/21 2000  vancomycin (VANCOCIN) IVPB 1000 mg/200 mL premix  Status:  Discontinued        1,000 mg 200 mL/hr over 60 Minutes Intravenous Every 24 hours 03/24/21 0858 03/24/21 1026   03/24/21 2000  vancomycin (VANCOREADY) IVPB 1000 mg/200 mL  Status:  Discontinued        1,000 mg 200 mL/hr over 60 Minutes Intravenous Every 24 hours 03/24/21 1026 03/25/21 0902   03/22/21 2000  vancomycin (VANCOREADY) IVPB 1250 mg/250 mL  Status:  Discontinued        1,250 mg 166.7 mL/hr over 90 Minutes Intravenous Every 24  hours 03/22/21 1159 03/24/21 0858   03/22/21 0600  piperacillin-tazobactam (ZOSYN) IVPB 3.375 g        3.375 g 12.5 mL/hr over 240 Minutes Intravenous Every 8 hours 03/21/21 2216 03/24/21 2359   03/21/21 2217  vancomycin variable dose per  unstable renal function (pharmacist dosing)  Status:  Discontinued         Does not apply See admin instructions 03/21/21 2217 03/24/21 0859   03/21/21 1900  ceFEPIme (MAXIPIME) 2 g in sodium chloride 0.9 % 100 mL IVPB        2 g 200 mL/hr over 30 Minutes Intravenous  Once 03/21/21 1851 03/21/21 2122   03/21/21 1900  metroNIDAZOLE (FLAGYL) IVPB 500 mg        500 mg 100 mL/hr over 60 Minutes Intravenous  Once 03/21/21 1851 03/21/21 2027   03/21/21 1900  vancomycin (VANCOCIN) IVPB 1000 mg/200 mL premix        1,000 mg 200 mL/hr over 60 Minutes Intravenous  Once 03/21/21 1851 03/21/21 2151       REVIEW OF SYSTEMS:  Const: negative fever, negative chills, negative weight loss Eyes: negative diplopia or visual changes, negative eye pain ENT: negative coryza, negative sore throat Resp: negative cough, hemoptysis, dyspnea Cards: negative for chest pain, palpitations, c/o pain and swelling rt thigh GU: has spc cath- urine leaking from the wounds GI: Negative for abdominal pain, diarrhea, bleeding, constipation Skin: stage Iv decubs, says they were clean and well managed until the fall Heme: negative for easy bruising and gum/nose bleeding MS: generalized weakness Fell to the floor while trying to transfer to a chair and stayed on the floor for 4 days till his daughter found him. Says he drank urine from the spc catheter to keep himself hydrated Neurolo:paraplegia Psych: negative for feelings of anxiety, depression  Endocrine: negative for thyroid, diabetes Allergy/Immunology- as above Objective:  VITALS:  BP (!) 102/55   Pulse (!) 116   Temp 99.2 F (37.3 C) (Oral)   Resp 18   Ht 5\' 10"  (1.778 m)   Wt 42 kg   SpO2 100%   BMI 13.29 kg/m   PHYSICAL EXAM:  General: Alert, cooperative, emaciated pale.  Head: Normocephalic, without obvious abnormality, atraumatic. Eyes: Conjunctivae clear, anicteric sclerae. Pupils are equal ENT Nares normal. No drainage or sinus tenderness. Lips, mucosa, and tongue normal. No Thrush Neck: , symmetrical, no adenopathy, thyroid: non tender no carotid bruit and no JVD. Back:  Pre debridement         Lungs: Clear to auscultation bilaterally. No Wheezing or Rhonchi. No rales. Heart: Regular rate and rhythm, no murmur, rub or gallop. Abdomen: Soft, non-tender,not distended. SPC   Bowel sounds normal. No masses Extremities: wasted Skin: as above Lymph: Cervical, supraclavicular normal. Neurologic: paraplegia Pertinent Labs Lab Results CBC    Component Value Date/Time   WBC 8.1 03/26/2021 0601   RBC 3.14 (L) 03/26/2021 0601   HGB 6.9 (L) 03/26/2021 0601   HGB 8.6 (L) 12/25/2013 0542   HCT 22.7 (L) 03/26/2021 0601   HCT 28.3 (L) 12/25/2013 0542   PLT 85 (L) 03/26/2021 0601   PLT 488 (H) 12/30/2013 0518   MCV 72.3 (L) 03/26/2021 0601   MCV 73 (L) 12/25/2013 0542   MCH 22.0 (L) 03/26/2021 0601   MCHC 30.4 03/26/2021 0601   RDW 24.2 (H) 03/26/2021 0601   RDW 18.0 (H) 12/25/2013 0542   LYMPHSABS 0.9 03/21/2021 1950   LYMPHSABS 1.2 12/25/2013 0542   MONOABS 0.3 03/21/2021 1950   MONOABS 0.5 12/25/2013 0542   EOSABS 0.0 03/21/2021 1950   EOSABS 0.3 12/25/2013 0542   BASOSABS 0.0 03/21/2021 1950   BASOSABS 0.1 12/25/2013 0542    CMP Latest Ref Rng & Units 03/26/2021 03/25/2021 03/25/2021  Glucose 70 -  99 mg/dL 90 - -  BUN 6 - 20 mg/dL <5(L) - -  Creatinine 0.61 - 1.24 mg/dL <0.30(L) - -  Sodium 135 - 145 mmol/L 139 - -  Potassium 3.5 - 5.1 mmol/L 4.6 3.9 4.0  Chloride 98 - 111 mmol/L 108 - -  CO2 22 - 32 mmol/L 25 - -  Calcium 8.9 - 10.3 mg/dL 7.9(L) - -  Total Protein 6.5 - 8.1 g/dL - - -  Total Bilirubin 0.3 - 1.2 mg/dL - - -  Alkaline Phos 38 - 126 U/L - - -  AST  15 - 41 U/L - - -  ALT 0 - 44 U/L - - -      Microbiology: Recent Results (from the past 240 hour(s))  Resp Panel by RT-PCR (Flu A&B, Covid) Nasopharyngeal Swab     Status: None   Collection Time: 03/21/21  7:50 PM   Specimen: Nasopharyngeal Swab; Nasopharyngeal(NP) swabs in vial transport medium  Result Value Ref Range Status   SARS Coronavirus 2 by RT PCR NEGATIVE NEGATIVE Final    Comment: (NOTE) SARS-CoV-2 target nucleic acids are NOT DETECTED.  The SARS-CoV-2 RNA is generally detectable in upper respiratory specimens during the acute phase of infection. The lowest concentration of SARS-CoV-2 viral copies this assay can detect is 138 copies/mL. A negative result does not preclude SARS-Cov-2 infection and should not be used as the sole basis for treatment or other patient management decisions. A negative result may occur with  improper specimen collection/handling, submission of specimen other than nasopharyngeal swab, presence of viral mutation(s) within the areas targeted by this assay, and inadequate number of viral copies(<138 copies/mL). A negative result must be combined with clinical observations, patient history, and epidemiological information. The expected result is Negative.  Fact Sheet for Patients:  EntrepreneurPulse.com.au  Fact Sheet for Healthcare Providers:  IncredibleEmployment.be  This test is no t yet approved or cleared by the [place] FDA and  has been authorized for detection and/or diagnosis of SARS-CoV-2 by FDA under an Emergency Use Authorization (EUA). This EUA will remain  in effect (meaning this test can be used) for the duration of the COVID-19 declaration under Section 564(b)(1) of the Act, 21 U.S.C.section 360bbb-3(b)(1), unless the authorization is terminated  or revoked sooner.       Influenza A by PCR NEGATIVE NEGATIVE Final   Influenza B by PCR NEGATIVE NEGATIVE Final    Comment: (NOTE) The  Xpert Xpress SARS-CoV-2/FLU/RSV plus assay is intended as an aid in the diagnosis of influenza from Nasopharyngeal swab specimens and should not be used as a sole basis for treatment. Nasal washings and aspirates are unacceptable for Xpert Xpress SARS-CoV-2/FLU/RSV testing.  Fact Sheet for Patients: EntrepreneurPulse.com.au  Fact Sheet for Healthcare Providers: IncredibleEmployment.be  This test is not yet approved or cleared by the [place] FDA and has been authorized for detection and/or diagnosis of SARS-CoV-2 by FDA under an Emergency Use Authorization (EUA). This EUA will remain in effect (meaning this test can be used) for the duration of the COVID-19 declaration under Section 564(b)(1) of the Act, 21 U.S.C. section 360bbb-3(b)(1), unless the authorization is terminated or revoked.  Performed at [facility], [address] [place], [place] [address]   Blood Culture (routine x 2)     Status: None   Collection Time: 03/21/21  7:50 PM   Specimen: BLOOD  Result Value Ref Range Status   Specimen Description BLOOD [facility]  Final   Special Requests   Final  BOTTLES DRAWN AEROBIC ONLY Blood Culture adequate volume   Culture   Final    NO GROWTH 5 DAYS Performed at [facility], [address] [place], [place] [address]    Report Status 03/26/2021 FINAL  Final  Blood Culture (routine x 2)     Status: Abnormal   Collection Time: 03/21/21  7:50 PM   Specimen: BLOOD  Result Value Ref Range Status   Specimen Description   Final    BLOOD BLOOD LEFT ARM Performed at [facility], [address] [place], [place] [address]    Special Requests   Final    BOTTLES DRAWN AEROBIC AND ANAEROBIC Blood Culture adequate volume Performed at [facility], [address] [place], [place] [address]    Culture  Setup Time   Final    Organism ID to follow [place] IN BOTH AEROBIC AND ANAEROBIC  BOTTLES CRITICAL RESULT CALLED TO, READ BACK BY AND VERIFIED WITH: RAQUEL RODRIGUEZ PHARMD 2118 03/22/21 HNM    Culture (A)  Final    STREPTOCOCCUS ANGINOSIS THE SIGNIFICANCE OF ISOLATING THIS ORGANISM FROM A SINGLE SET OF BLOOD CULTURES WHEN MULTIPLE SETS ARE DRAWN IS UNCERTAIN. PLEASE NOTIFY THE MICROBIOLOGY DEPARTMENT WITHIN ONE WEEK IF SPECIATION AND SENSITIVITIES ARE REQUIRED. Performed at [facility], [place] [address], [place], [place] [address]    Report Status 03/25/2021 FINAL  Final  Urine Culture     Status: Abnormal   Collection Time: 03/21/21  7:50 PM   Specimen: In/Out Cath Urine  Result Value Ref Range Status   Specimen Description   Final    IN/OUT CATH URINE Performed at [facility], [address], [place], [place] [address]    Special Requests   Final    NONE Performed at [facility], [address], [place], [place] [address]    Culture (A)  Final    >=100,000 COLONIES/mL [facility] MORGANII WITHIN MIXED ORGANISMS Performed at [facility], [place] [address] [place], [place] [address]    Report Status 03/24/2021 FINAL  Final   Organism ID, Bacteria MORGANELLA MORGANII (A)  Final      Susceptibility   Morganella morganii - MIC*    AMPICILLIN >=32 RESISTANT Resistant     CEFAZOLIN RESISTANT Resistant     CIPROFLOXACIN <=0.25 SENSITIVE Sensitive     GENTAMICIN <=1 SENSITIVE Sensitive     IMIPENEM <=0.25 SENSITIVE Sensitive     NITROFURANTOIN RESISTANT Resistant     TRIMETH/SULFA <=20 SENSITIVE Sensitive     AMPICILLIN/SULBACTAM >=32 RESISTANT Resistant     PIP/TAZO 8 SENSITIVE Sensitive     * >=100,000 COLONIES/mL MORGANELLA MORGANII  Blood Culture ID Panel (Reflexed)     Status: Abnormal   Collection Time: 03/21/21  7:50 PM  Result Value Ref Range Status   Enterococcus faecalis NOT DETECTED NOT DETECTED Final   Enterococcus Faecium NOT DETECTED NOT DETECTED Final   Listeria monocytogenes NOT DETECTED NOT DETECTED Final    Staphylococcus species NOT DETECTED NOT DETECTED Final   Staphylococcus aureus (BCID) NOT DETECTED NOT DETECTED Final   Staphylococcus epidermidis NOT DETECTED NOT DETECTED Final   Staphylococcus lugdunensis NOT DETECTED NOT DETECTED Final   Streptococcus species DETECTED (A) NOT DETECTED Final    Comment: Not Enterococcus species, Streptococcus agalactiae, Streptococcus pyogenes, or Streptococcus pneumoniae. CRITICAL RESULT CALLED TO, READ BACK BY AND VERIFIED WITH: RAQUEL RODRIGUEZ PHARMD 2118 03/22/21 HNM    Streptococcus agalactiae NOT DETECTED NOT DETECTED Final  Streptococcus pneumoniae NOT DETECTED NOT DETECTED Final   Streptococcus pyogenes NOT DETECTED NOT DETECTED Final   A.calcoaceticus-baumannii NOT DETECTED NOT DETECTED Final   Bacteroides fragilis NOT DETECTED NOT DETECTED Final   Enterobacterales NOT DETECTED NOT DETECTED Final   Enterobacter cloacae complex NOT DETECTED NOT DETECTED Final   Escherichia coli NOT DETECTED NOT DETECTED Final   Klebsiella aerogenes NOT DETECTED NOT DETECTED Final   Klebsiella oxytoca NOT DETECTED NOT DETECTED Final   Klebsiella pneumoniae NOT DETECTED NOT DETECTED Final   Proteus species NOT DETECTED NOT DETECTED Final   Salmonella species NOT DETECTED NOT DETECTED Final   Serratia marcescens NOT DETECTED NOT DETECTED Final   Haemophilus influenzae NOT DETECTED NOT DETECTED Final   Neisseria meningitidis NOT DETECTED NOT DETECTED Final   Pseudomonas aeruginosa NOT DETECTED NOT DETECTED Final   Stenotrophomonas maltophilia NOT DETECTED NOT DETECTED Final   Candida albicans NOT DETECTED NOT DETECTED Final   Candida auris NOT DETECTED NOT DETECTED Final   Candida glabrata NOT DETECTED NOT DETECTED Final   Candida krusei NOT DETECTED NOT DETECTED Final   Candida parapsilosis NOT DETECTED NOT DETECTED Final   Candida tropicalis NOT DETECTED NOT DETECTED Final   Cryptococcus neoformans/gattii NOT DETECTED NOT DETECTED Final    Comment:  Performed at [facility], [address] [place], [place] [address]    IMAGING RESULTS: CXR  I have personally reviewed the films ?No acute abnormality noted   Impression/Recommendation ?H/o fall and stayed  on the floor for 4 days  Paraplegia   Stage IV Sacral and b/l ischial wounds worse since the fall- had necrosis and were contaminated with stool and  underwent debridement. Will change ceftriaxone to zosyn  ?Strep anginosis bacteremia- likely from contaminated wound  Neurogenic bladder- as suprapubic cath- has a urocutaneous fistula- followed at [facility]  Severe anemia- received PRBC  Thrombocytopenia could be from hypothermia/ infection  Hypo ? Urine -morganella- could be colonization , but because of being on the floor for 4 days risk for infection high -hence will treat ___________________________________________________ Discussed with patient, requesting provider Note:  This document was prepared using Dragon voice recognition software and may include unintentional dictation errors.

## 2021-03-26 NOTE — Progress Notes (Signed)
Initial Nutrition Assessment  DOCUMENTATION CODES:   Severe malnutrition in context of social or environmental circumstances, Underweight  INTERVENTION:   -Ensure Enlive po TID, each supplement provides 350 kcal and 20 grams of protein  -Ocuvite daily  NUTRITION DIAGNOSIS:   Severe Malnutrition related to social / environmental circumstances as evidenced by energy intake < or equal to 75% for > or equal to 1 month, moderate fat depletion, severe fat depletion, moderate muscle depletion, severe muscle depletion, percent weight loss.  GOAL:   Patient will meet greater than or equal to 90% of their needs  MONITOR:   PO intake, Supplement acceptance, Labs, Weight trends, Skin, I & O's  REASON FOR ASSESSMENT:   Consult Assessment of nutrition requirement/status, Poor PO, Wound healing  ASSESSMENT:   45 y.o. Male with PMH of paraplegia of lower extremities, presented after falling with transfer, found after 4 days.  Admitted with Severe Sepsis due to UTI and infected decubitus wounds.  Pt admitted with severe sepsis due to UTI and contaminated decubitus ulcers.   11/26- s/p Sharp excisional debridement of sacral and bilateral buttock and hip stage 4 infected ulcer 11/28- s/p PICC placement  Reviewed I/O's: -3.6 L x 24 hours and -2.9 L since admission  UOP: 5.1 L x 24 hours  Spoke with pt at bedside, who was initially guarded at time of visit, but opened up to this RD as interview progressed. Per pt, he was "in survival mode" PTA; he reports he was without food or drink for 12 days PTA and had resorted to drinking his own urine to survive.    Per pt, he is trying to eat, however, he reports it is difficult secondary to mouth sores. He consumed "some" breakfast and drank an entire [employer] supplement. Noted meal completions 10-30%.   Pt endorses wt loss, but unsure how much or UBW. Reviewed wt hx; pt has experienced a 15.8% wt loss over the past 3 months, which is significant  for time frame.   Discussed importance of good meal and supplement intake to promote healing. Pt amenable to Ensure supplements and likes all flavors. Offered to downgrade diet mechanically for ease of intake, however, politely declined and would like to stay on regular diet for wider variety of food options. Reviewed menu with pt and encouraged him to order softer foods that he can tolerate.  Per previous RD notes, pt with history of moderate malnutrition due to chronic illness, but suspect that pt's social situation has worsened nutritional status.   Labs reviewed.   NUTRITION - FOCUSED PHYSICAL EXAM:  Flowsheet Row Most Recent Value  Orbital Region Moderate depletion  Upper Arm Region Severe depletion  Thoracic and Lumbar Region Severe depletion  Buccal Region Severe depletion  Temple Region Moderate depletion  Clavicle Bone Region Severe depletion  Clavicle and Acromion Bone Region Severe depletion  Scapular Bone Region Severe depletion  Dorsal Hand Moderate depletion  Patellar Region Severe depletion  Anterior Thigh Region Severe depletion  Posterior Calf Region Severe depletion  Edema (RD Assessment) Mild  Hair Reviewed  Eyes Reviewed  Mouth Reviewed  Skin Reviewed  Nails Reviewed       Diet Order:   Diet Order             Diet regular Room service appropriate? Yes; Fluid consistency: Thin  Diet effective now                   EDUCATION NEEDS:   Education needs have been  addressed  Skin:  Skin Assessment: Skin Integrity Issues: Skin Integrity Issues:: Other (Comment), Stage IV Stage IV: hip Other: pressure injury to bilateral buttocks  Last BM:  03/25/21 (500 ml via rectal tube)  Height:   Ht Readings from Last 1 Encounters:  03/21/21 5\' 10"  (1.778 m)    Weight:   Wt Readings from Last 1 Encounters:  03/25/21 42 kg   BMI:  Body mass index is 13.29 kg/m.  Estimated Nutritional Needs:   Kcal:  1700-1900  Protein:  90-105 grams  Fluid:   > 1.7 L    Loistine Chance, RD, LDN, [place] Registered Dietitian II Certified Diabetes Care and Education Specialist Please refer to [facility] for RD and/or RD on-call/weekend/after hours pager

## 2021-03-26 NOTE — Progress Notes (Signed)
Peripherally Inserted Central Catheter Placement  The IV Nurse has discussed with the patient and/or persons authorized to consent for the patient, the purpose of this procedure and the potential benefits and risks involved with this procedure.  The benefits include less needle sticks, lab draws from the catheter, and the patient may be discharged home with the catheter. Risks include, but not limited to, infection, bleeding, blood clot (thrombus formation), and puncture of an artery; nerve damage and irregular heartbeat and possibility to perform a PICC exchange if needed/ordered by physician.  Alternatives to this procedure were also discussed.  Bard Power PICC patient education guide, fact sheet on infection prevention and patient information card has been provided to patient /or left at bedside.    PICC Placement Documentation  PICC Double Lumen 03/26/21 Right Brachial 39 cm 1 cm (Active)  Indication for Insertion or Continuance of Line Prolonged intravenous therapies;Chronic illness with exacerbations (CF, Sickle Cell, etc.);Poor Vasculature-patient has had multiple peripheral attempts or PIVs lasting less than 24 hours 03/26/21 0859  Exposed Catheter (cm) 1 cm 03/26/21 0859  Site Assessment Clean;Dry;Intact 03/26/21 0859  Lumen #1 Status Flushed;Saline locked;Blood return noted 03/26/21 0859  Lumen #2 Status Flushed;Saline locked;Blood return noted 03/26/21 0859  Dressing Type Transparent;Securing device 03/26/21 0859  Dressing Status Clean;Dry;Intact 03/26/21 0859  Antimicrobial disc in place? Yes 03/26/21 0859  Safety Lock Not Applicable 03/26/21 0859  Dressing Intervention Other (Comment) 03/26/21 0859  Dressing Change Due 04/02/21 03/26/21 0859       Annett Fabian 03/26/2021, 9:01 AM

## 2021-03-27 DIAGNOSIS — L8995 Pressure ulcer of unspecified site, unstageable: Secondary | ICD-10-CM | POA: Diagnosis not present

## 2021-03-27 DIAGNOSIS — D649 Anemia, unspecified: Secondary | ICD-10-CM | POA: Diagnosis not present

## 2021-03-27 DIAGNOSIS — E440 Moderate protein-calorie malnutrition: Secondary | ICD-10-CM

## 2021-03-27 DIAGNOSIS — A419 Sepsis, unspecified organism: Secondary | ICD-10-CM | POA: Diagnosis not present

## 2021-03-27 DIAGNOSIS — L8944 Pressure ulcer of contiguous site of back, buttock and hip, stage 4: Secondary | ICD-10-CM

## 2021-03-27 DIAGNOSIS — N179 Acute kidney failure, unspecified: Secondary | ICD-10-CM | POA: Diagnosis not present

## 2021-03-27 DIAGNOSIS — B955 Unspecified streptococcus as the cause of diseases classified elsewhere: Secondary | ICD-10-CM

## 2021-03-27 LAB — COMPREHENSIVE METABOLIC PANEL
ALT: 21 U/L (ref 0–44)
AST: 29 U/L (ref 15–41)
Albumin: 2.7 g/dL — ABNORMAL LOW (ref 3.5–5.0)
Alkaline Phosphatase: 54 U/L (ref 38–126)
Anion gap: 5 (ref 5–15)
BUN: 8 mg/dL (ref 6–20)
CO2: 24 mmol/L (ref 22–32)
Calcium: 7.9 mg/dL — ABNORMAL LOW (ref 8.9–10.3)
Chloride: 105 mmol/L (ref 98–111)
Creatinine, Ser: <0.3 mg/dL — ABNORMAL LOW (ref 0.61–1.24)
Glucose, Bld: 85 mg/dL (ref 70–99)
Potassium: 4.1 mmol/L (ref 3.5–5.1)
Sodium: 134 mmol/L — ABNORMAL LOW (ref 135–145)
Total Bilirubin: 0.9 mg/dL (ref 0.3–1.2)
Total Protein: 5.8 g/dL — ABNORMAL LOW (ref 6.5–8.1)

## 2021-03-27 LAB — CBC
HCT: 24.9 % — ABNORMAL LOW (ref 39.0–52.0)
Hemoglobin: 7.7 g/dL — ABNORMAL LOW (ref 13.0–17.0)
MCH: 22.8 pg — ABNORMAL LOW (ref 26.0–34.0)
MCHC: 30.9 g/dL (ref 30.0–36.0)
MCV: 73.9 fL — ABNORMAL LOW (ref 80.0–100.0)
Platelets: 92 10*3/uL — ABNORMAL LOW (ref 150–400)
RBC: 3.37 MIL/uL — ABNORMAL LOW (ref 4.22–5.81)
RDW: 22.6 % — ABNORMAL HIGH (ref 11.5–15.5)
WBC: 9.4 10*3/uL (ref 4.0–10.5)
nRBC: 0 % (ref 0.0–0.2)

## 2021-03-27 LAB — SURGICAL PATHOLOGY

## 2021-03-27 MED ORDER — PIPERACILLIN-TAZOBACTAM 3.375 G IVPB
INTRAVENOUS | Status: AC
  Filled 2021-03-27: qty 50

## 2021-03-27 MED ORDER — PIPERACILLIN-TAZOBACTAM 3.375 G IVPB
INTRAVENOUS | Status: AC
  Administered 2021-03-27: 3.375 g via INTRAVENOUS
  Filled 2021-03-27: qty 50

## 2021-03-27 MED ORDER — MAGIC MOUTHWASH W/LIDOCAINE
2.0000 mL | Freq: Three times a day (TID) | ORAL | Status: DC | PRN
  Administered 2021-03-27 – 2021-03-30 (×7): 2 mL via ORAL
  Filled 2021-03-27: qty 2
  Filled 2021-03-27 (×10): qty 5
  Filled 2021-03-27: qty 2
  Filled 2021-03-27 (×4): qty 5
  Filled 2021-03-27: qty 2
  Filled 2021-03-27 (×3): qty 5

## 2021-03-27 NOTE — Progress Notes (Signed)
[facility] competed spiritual consult request. Patient was tearful upon arrival. [facility] provided empathetic responses. Spiritual care was provided through prayer and scripture. Spiritual care visit was appreciated.    Rev. Casilda Carls, M.Div. Healthcare Chaplain

## 2021-03-27 NOTE — Plan of Care (Signed)

## 2021-03-27 NOTE — Progress Notes (Signed)
PT Cancellation Note  Patient Details Name: KAIROS PANETTA MRN: [id number] DOB: 1975/07/25   Cancelled Treatment:    Reason Eval/Treat Not Completed: Other (comment). Pt with breakfast at start of session, PT to re-attempt.  Olga Coaster PT, [name] 10:20 AM,03/27/21

## 2021-03-27 NOTE — NC FL2 (Signed)
[place] LEVEL OF CARE SCREENING TOOL     IDENTIFICATION  Patient Name: Jonathon York Birthdate: 1976/02/15 Sex: male Admission Date (Current Location): 03/21/2021  [place] and [place] Number:  Engineering geologist and Address:  [facility], [address] [place], [place] [address]      Provider Number: [id number]  Attending Physician Name and Address:  Richarda Osmond, MD  Relative Name and Phone Number:       Current Level of Care: Hospital Recommended Level of Care: [place] Prior Approval Number:    Date Approved/Denied:   PASRR Number: [id number]  Discharge Plan: SNF    Current Diagnoses: Patient Active Problem List   Diagnosis Date Noted   Acute on chronic anemia    AKI (acute kidney injury) ([place])    Urinary retention 02/10/2021   Nausea and vomiting 08/29/2020   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   Generalized abdominal pain    UTI (urinary tract infection) 12/02/2019   UTI (urinary tract infection) due to urinary indwelling catheter ([place]) 12/01/2019   Infected decubitus ulcer 10/27/2019   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[place]) 10/27/2019   Bilateral leg edema 10/27/2019   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    Constipation    Iron deficiency anemia    Sacral decubitus ulcer, stage IV ([place]) 07/13/2019   Intractable pain 11/26/2018   Anxiety 04/22/2018   Acute osteomyelitis of right foot ([place]) 03/18/2018   Pressure injury of skin 03/18/2018   Stage IV pressure ulcer of right buttock (HCC)    Protein-calorie malnutrition, severe 11/20/2016   Herpes simplex infection of penis    Decubitus ulcer 11/19/2016   Severe recurrent major depression without psychotic features ([place]) 04/16/2016   Decubitus ulcer of sacral region    Sepsis ([place]) 04/15/2016   Amputation of fourth toe, right, traumatic ([place]) 04/30/2015   Pressure ulcer stage III 04/30/2015   Sterile pyuria 04/30/2015   Paraplegia ([place]) 04/30/2015    Neurogenic bladder 04/30/2015   Toe osteomyelitis, right ([place]) 04/28/2015   Moderate malnutrition ([place]) 04/28/2015    Orientation RESPIRATION BLADDER Height & Weight     Self, Time, Situation, Place  Normal Continent Weight: 92 lb 9.5 oz (42 kg) Height:  5\' 10"  (177.8 cm)  BEHAVIORAL SYMPTOMS/MOOD NEUROLOGICAL BOWEL NUTRITION STATUS      Incontinent Diet (regular diet)  AMBULATORY STATUS COMMUNICATION OF NEEDS Skin   Extensive Assist Verbally  (PI on buttocks. closed wound on buttocks)                       Personal Care Assistance Level of Assistance  Bathing, Feeding, Dressing Bathing Assistance: Maximum assistance Feeding assistance: Independent Dressing Assistance: Maximum assistance     Functional Limitations Info  Sight, Speech, Hearing Sight Info: Adequate Hearing Info: Adequate Speech Info: Adequate    SPECIAL CARE FACTORS FREQUENCY  PT (By licensed PT), OT (By licensed OT)     PT Frequency: 5x OT Frequency: 5x            Contractures Contractures Info: Not present    Additional Factors Info  Code Status, Allergies Code Status Info: Full code Allergies Info: Orange Fruit (Citrus), Sulfa Antibiotics           Current Medications (03/27/2021):  This is the current hospital active medication list Current Facility-Administered Medications  Medication Dose Route Frequency Provider Last Rate Last Admin   0.9 %  sodium chloride infusion (Manually program via Guardrails IV Fluids)   Intravenous  Once Rust[place], Micheline Rough L, NP       0.9 %  sodium chloride infusion  250 mL Intravenous Continuous Carolan Shiver, MD   Held at 03/23/21 2251   acetaminophen (TYLENOL) tablet 650 mg  650 mg Oral Q6H PRN Leeroy Bock, MD       ascorbic acid (VITAMIN C) tablet 500 mg  500 mg Oral BID Carolan Shiver, MD   500 mg at 03/27/21 1019   Chlorhexidine Gluconate Cloth 2 % PADS 6 each  6 each Topical Daily Carolan Shiver, MD   6 each at 03/27/21  1238   collagenase (SANTYL) ointment   Topical BID Carolan Shiver, MD   Given at 03/27/21 1238   feeding supplement (ENSURE ENLIVE / ENSURE PLUS) liquid 237 mL  237 mL Oral TID BM Carolan Shiver, MD   237 mL at 03/27/21 1020   HYDROcodone-acetaminophen (NORCO/VICODIN) 5-325 MG per tablet 1-2 tablet  1-2 tablet Oral Q6H PRN Leeroy Bock, MD   2 tablet at 03/27/21 0640   HYDROmorphone (DILAUDID) tablet 1 mg  1 mg Oral Q6H PRN Leeroy Bock, MD   1 mg at 03/27/21 1019   magic mouthwash w/lidocaine  2 mL Oral TID PRN Leeroy Bock, MD   2 mL at 03/27/21 1235   multivitamin-lutein (OCUVITE-LUTEIN) capsule 1 capsule  1 capsule Oral Daily Carolan Shiver, MD   1 capsule at 03/27/21 1236   piperacillin-tazobactam (ZOSYN) IVPB 3.375 g  3.375 g Intravenous Q8H Ravishankar, Rhodia Albright, MD 12.5 mL/hr at 03/27/21 0610 3.375 g at 03/27/21 0610   polyethylene glycol (MIRALAX / GLYCOLAX) packet 17 g  17 g Oral Daily PRN Carolan Shiver, MD       sodium chloride flush (NS) 0.9 % injection 10-40 mL  10-40 mL Intracatheter Q12H Leeroy Bock, MD   10 mL at 03/27/21 1237   traZODone (DESYREL) tablet 50 mg  50 mg Oral QHS PRN Carolan Shiver, MD   50 mg at 03/24/21 0030     Discharge Medications: Please see discharge summary for a list of discharge medications.  Relevant Imaging Results:  Relevant Lab Results:   Additional Information SSN:[id number]  Maree Krabbe, LCSW

## 2021-03-27 NOTE — Progress Notes (Signed)
Palliative:  Chart reviewed. Conference with transition of care team related to patient condition, needs, disposition. Palliative to shadow.    Plan:   At this point continue to treat the treatable.  Time for outcomes.  Agreeable to short-term rehab.  Agreeable to outpatient palliative/hospice care when appropriate with [facility].  No charge  Lillia Carmel, NP Palliative medicine team Team phone [phone] Greater than 50% of this time was spent counseling and coordinating care related to the above assessment and plan.

## 2021-03-27 NOTE — Progress Notes (Signed)
   Date of Admission:  03/21/2021      ID: Jonathon York is a 45 y.o. male  Principal Problem:   Sepsis (HCC) Active Problems:   Acute on chronic anemia   AKI (acute kidney injury) (HCC)    Subjective: Pt has some burning tongue Unable to eat well  Medications:   sodium chloride   Intravenous Once   vitamin C  500 mg Oral BID   Chlorhexidine Gluconate Cloth  6 each Topical Daily   collagenase   Topical BID   feeding supplement  237 mL Oral TID BM   multivitamin-lutein  1 capsule Oral Daily   sodium chloride flush  10-40 mL Intracatheter Q12H    Objective: Vital signs in last 24 hours: Temp:  [98.2 F (36.8 C)-98.7 F (37.1 C)] 98.5 F (36.9 C) (11/29 1604) Pulse Rate:  [90-117] 117 (11/29 1604) Resp:  [16-18] 16 (11/29 0624) BP: (95-109)/(60-73) 109/73 (11/29 1604) SpO2:  [100 %] 100 % (11/29 1604)  PHYSICAL EXAM:  General: awake and alert, emaciated, pale Head: Normocephalic, without obvious abnormality, atraumatic. Eyes: Conjunctivae clear, anicteric sclerae. Pupils are equal ENT Nares normal. No drainage or sinus tenderness. Lips, pin point ulceration Neck: , symmetrical, no adenopathy, thyroid: non tender no carotid bruit and no JVD. Sacral and hip wounds reviewed Rt hip- bone/hip joint sticking  thru the wound- extensive loss of tissue      Left hip wound- tunneling . Has some ischemic skin margin Sacral wound clean Lungs: b/l air entry Heart: Regular rate and rhythm, no murmur, rub or gallop. Abdomen: Soft, non-tender,not distended. Bowel sounds normal. No masses Extremities:rt PICC wasting of lower extremities Skin: No rashes or lesions. Or bruising Lymph: Cervical, supraclavicular normal. Neurologic: paraplegia  Lab Results Recent Labs    03/26/21 0601 03/26/21 2120 03/27/21 0611  WBC 8.1  --  9.4  HGB 6.9* 7.5* 7.7*  HCT 22.7* 24.0* 24.9*  NA 139  --  134*  K 4.6  --  4.1  CL 108  --  105  CO2 25  --  24  BUN <5*  --  8   CREATININE <0.30*  --  <0.30*   Liver Panel Recent Labs    03/25/21 0105 03/27/21 0611  PROT  --  5.8*  ALBUMIN 2.4* 2.7*  AST  --  29  ALT  --  21  ALKPHOS  --  54  BILITOT  --  0.9   Microbiology: 03/21/21 Urine culture morganella BC strep anginosis   Studies/Results: [place] EKG SITE RITE  Result Date: 03/26/2021 If Site Rite image not attached, placement could not be confirmed due to current cardiac rhythm.    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

## 2021-03-27 NOTE — Progress Notes (Signed)
PROGRESS NOTE  Jonathon York    DOB: 01/01/1976, 45 y.o.  HDQ:[id number]  PCP: Mick Sell, MD   Code Status: Full Code   DOA: 03/21/2021   LOS: 6  Brief Narrative of Current Hospitalization  Jonathon York is a 45 y.o. male with a PMH significant for paraplegia, chronic sacral decubitus ulcers, neurogenic bladder with suprapubic catheter. They presented from home to the ED on 03/21/2021 after fall and being on ground for 4 days prior to being found. He states he drank his own urine during this time. In the ED, it was found that they were soiled, dehydrated, meeting sepsis criteria from wound infection of his chronic sacral uclers. They were treated with IV fluids and antibiotics.  Patient was admitted to medicine service for further workup and management of sepsis as outlined in detail below.  03/27/21 -stable, improved  Assessment & Plan  Principal Problem:   Sepsis (HCC) Active Problems:   Acute on chronic anemia   AKI (acute kidney injury) (HCC)  Sepsis- criteria have resolved. He now has stable vital signs, afebrile with persistent sinus tachycardia (asymptomatic). Blood culture 11/23 had contaminant but otherwise NGTD. Urine Cx showed morganella morganii with mixed organisms. Levophed discontinued 11/27 - suprapubic cath replaced - wound care - vitals per floor protocol  Severe sacral/buttocks/hips decubitus ulcers- s/p debridement 11/26 - ID following, appreciate recs - continue IV Abx via PICC line  - cefepime, flagyl (11/23)  - vanc (11/23-26) - CTX (11/27-28)  - zosyn (11/24-11/26, 11/28- - SNF discharge for advanced wound care and deconditioning - PT/OT - surgery continues to follow, appreciate recs  - will need care at tertiary center via plastic surgery and other resources for further advanced treatment of wounds - analgesia PRN. Discontinued morphine as can contribute to hypotension.  Refeeding syndrome  severe protein calorie malnutrition-  -  monitor and replace electrolytes PRN - RD consult, appreciate recs  Anxiety/depression - psychiatry was consulted - continue  AKI- resolved - BMP am  Anemia  thrombocytopenia- s/p blood transfusion 11/23. Hgb 6.9 this am - blood transfusion in process 1unit - post transfusion and am CBC  DVT prophylaxis: SCDs Start: 03/21/21 2240   Diet:  Diet Orders (From admission, onward)     Start     Ordered   03/23/21 1303  Diet regular Room service appropriate? Yes; Fluid consistency: Thin  Diet effective now       Question Answer Comment  Room service appropriate? Yes   Fluid consistency: Thin      03/23/21 1302            Subjective 03/27/21    Pt reports overall improvement. Still experiencing weakness and overall "cloudiness".   Disposition Plan & Communication  Patient status: Inpatient  Admitted From: Home Disposition: Skilled nursing facility, patient medically stable for discharge to SNF when bed available Anticipated discharge date: 11/30  Family Communication: none  Consults, Procedures, Significant Events  Consultants:  CCM General surgery Psych   Procedures/significant events:  Debridement 11/26  Antimicrobials:  Anti-infectives (From admission, onward)    Start     Dose/Rate Route Frequency Ordered Stop   03/27/21 0600  piperacillin-tazobactam (ZOSYN) IVPB 3.375 g        3.375 g 12.5 mL/hr over 240 Minutes Intravenous Every 8 hours 03/26/21 2329     03/25/21 1000  cefTRIAXone (ROCEPHIN) 2 g in sodium chloride 0.9 % 100 mL IVPB  Status:  Discontinued        2  g 200 mL/hr over 30 Minutes Intravenous Every 24 hours 03/24/21 1510 03/26/21 2329   03/24/21 2000  vancomycin (VANCOCIN) IVPB 1000 mg/200 mL premix  Status:  Discontinued        1,000 mg 200 mL/hr over 60 Minutes Intravenous Every 24 hours 03/24/21 0858 03/24/21 1026   03/24/21 2000  vancomycin (VANCOREADY) IVPB 1000 mg/200 mL  Status:  Discontinued        1,000 mg 200 mL/hr over 60 Minutes  Intravenous Every 24 hours 03/24/21 1026 03/25/21 0902   03/22/21 2000  vancomycin (VANCOREADY) IVPB 1250 mg/250 mL  Status:  Discontinued        1,250 mg 166.7 mL/hr over 90 Minutes Intravenous Every 24 hours 03/22/21 1159 03/24/21 0858   03/22/21 0600  piperacillin-tazobactam (ZOSYN) IVPB 3.375 g        3.375 g 12.5 mL/hr over 240 Minutes Intravenous Every 8 hours 03/21/21 2216 03/24/21 2359   03/21/21 2217  vancomycin variable dose per unstable renal function (pharmacist dosing)  Status:  Discontinued         Does not apply See admin instructions 03/21/21 2217 03/24/21 0859   03/21/21 1900  ceFEPIme (MAXIPIME) 2 g in sodium chloride 0.9 % 100 mL IVPB        2 g 200 mL/hr over 30 Minutes Intravenous  Once 03/21/21 1851 03/21/21 2122   03/21/21 1900  metroNIDAZOLE (FLAGYL) IVPB 500 mg        500 mg 100 mL/hr over 60 Minutes Intravenous  Once 03/21/21 1851 03/21/21 2027   03/21/21 1900  vancomycin (VANCOCIN) IVPB 1000 mg/200 mL premix        1,000 mg 200 mL/hr over 60 Minutes Intravenous  Once 03/21/21 1851 03/21/21 2151       Objective   Vitals:   03/26/21 1944 03/26/21 2000 03/27/21 0624 03/27/21 0656  BP:   95/61 95/60  Pulse:  (!) 105 (!) 101 99  Resp: 16  16   Temp:   98.7 F (37.1 C) 98.7 F (37.1 C)  TempSrc:   Oral   SpO2:   100% 100%  Weight:      Height:        Intake/Output Summary (Last 24 hours) at 03/27/2021 0657 Last data filed at 03/27/2021 5520 Gross per 24 hour  Intake 444 ml  Output 2075 ml  Net -1631 ml    Filed Weights   03/23/21 0500 03/24/21 0500 03/25/21 0500  Weight: 42.6 kg 42.6 kg 42 kg    Patient BMI: Body mass index is 13.29 kg/m.   Physical Exam: General: awake, alert, NAD HEENT: atraumatic, clear conjunctiva, anicteric sclera, moist mucus membranes, hearing grossly normal Respiratory: normal respiratory effort. Cardiovascular: quick capillary refill  Nervous: A&O x3. no gross focal neurologic deficits, normal speech Extremities:  moves UE equally. Lower extremities low muscle tone Skin: dry, intact, normal temperature, normal color on exposed skin. Lesions on hips/buttocks= please see clinical images in surgery note for further detail.  Psychiatry: anxious mood, congruent affect  Labs   I have personally reviewed following labs and imaging studies CBC    Component Value Date/Time   WBC 9.4 03/27/2021 0611   RBC 3.37 (L) 03/27/2021 0611   HGB 7.7 (L) 03/27/2021 0611   HGB 8.6 (L) 12/25/2013 0542   HCT 24.9 (L) 03/27/2021 0611   HCT 28.3 (L) 12/25/2013 0542   PLT 92 (L) 03/27/2021 0611   PLT 488 (H) 12/30/2013 0518   MCV 73.9 (L) 03/27/2021 8022  MCV 73 (L) 12/25/2013 0542   MCH 22.8 (L) 03/27/2021 0611   MCHC 30.9 03/27/2021 0611   RDW 22.6 (H) 03/27/2021 0611   RDW 18.0 (H) 12/25/2013 0542   LYMPHSABS 0.9 03/21/2021 1950   LYMPHSABS 1.2 12/25/2013 0542   MONOABS 0.3 03/21/2021 1950   MONOABS 0.5 12/25/2013 0542   EOSABS 0.0 03/21/2021 1950   EOSABS 0.3 12/25/2013 0542   BASOSABS 0.0 03/21/2021 1950   BASOSABS 0.1 12/25/2013 0542   BMP Latest Ref Rng & Units 03/27/2021 03/26/2021 03/25/2021  Glucose 70 - 99 mg/dL 85 90 -  BUN 6 - 20 mg/dL 8 <3(Z) -  Creatinine 7.67 - 1.24 mg/dL <3.41(P) <3.79(K) -  Sodium 135 - 145 mmol/L 134(L) 139 -  Potassium 3.5 - 5.1 mmol/L 4.1 4.6 3.9  Chloride 98 - 111 mmol/L 105 108 -  CO2 22 - 32 mmol/L 24 25 -  Calcium 8.9 - 10.3 mg/dL 7.9(L) 7.9(L) -   Imaging Studies  No results found. Medications   Scheduled Meds:  sodium chloride   Intravenous Once   vitamin C  500 mg Oral BID   Chlorhexidine Gluconate Cloth  6 each Topical Daily   collagenase   Topical BID   feeding supplement  237 mL Oral TID BM   multivitamin-lutein  1 capsule Oral Daily   sodium chloride flush  10-40 mL Intracatheter Q12H   No recently discontinued medications to reconcile  LOS: 6 days   Time spent: >60min  Leeroy Bock, DO [facility] 03/27/2021, 6:57 AM   Please  refer to amion to contact the [facility] Attending or Consulting provider for this pt  [email or web address] Available by Epic secure chat 7AM-7PM. If 7PM-7AM, please contact night-coverage

## 2021-03-27 NOTE — Evaluation (Signed)
Physical Therapy Evaluation Patient Details Name: Jonathon York MRN: [id number] DOB: 12-29-1975 Today's Date: 03/27/2021  History of Present Illness  Jonathon York is a 45yoM who comes to [facility] after a fall out of bed and being on the floor for 4 days. PMH of  pressure ulcers at sacrum, Left ischium, Rt ischium, fall c T12 SCI (aged 45 per pt), paraplegia c WC mobility, neurogenic bladder, foot amputations, anxiety .   Clinical Impression  Patient was A&Ox4, reported some back pain while laying in bed. Pt reported prior to the episode of getting sick and falling the the floor, he is modI/I for ADLs/IADLs with his "quickie" WC. Has a daughter nearby that can assist PRN, did state he is unable to go stay with her at discharge.   The patient endorsed feeling much weaker than usual. ModA to roll in bed for linen change, and minA for sidelying <> sit. He was able to sit EOB with CGA ~20seconds, fatigue very quickly. Stated normally he is able to laterally scoot L or R to his WC.  Overall the patient demonstrated deficits (see "PT Problem List") that impede the patient's functional abilities, safety, and mobility and would benefit from skilled PT intervention. Recommendation at this time is SNF due to decline in functional status, current level of assistance needed, and decreased caregiver support.         Recommendations for follow up therapy are one component of a multi-disciplinary discharge planning process, led by the attending physician.  Recommendations may be updated based on patient status, additional functional criteria and insurance authorization.  Follow Up Recommendations Skilled nursing-short term rehab (<3 hours/day)    Assistance Recommended at Discharge    Functional Status Assessment Patient has had a recent decline in their functional status and demonstrates the ability to make significant improvements in function in a reasonable and predictable amount of time.  Equipment  Recommendations   (TBD at next venue of care)    Recommendations for Other Services OT consult     Precautions / Restrictions Precautions Precautions: Fall Restrictions Other Position/Activity Restrictions: paraplegia      Mobility  Bed Mobility Overal bed mobility: Needs Assistance Bed Mobility: Rolling;Sidelying to Sit;Sit to Sidelying Rolling: Mod assist Sidelying to sit: Min assist     Sit to sidelying: Min assist      Transfers                   General transfer comment: pt unable at this time; fatigue, inadeuqate equipment to perform safely (pt does not have his WC at hospital)    Ambulation/Gait                  Stairs            Wheelchair Mobility    Modified Rankin (Stroke Patients Only)       Balance Overall balance assessment: Needs assistance Sitting-balance support: Bilateral upper extremity supported Sitting balance-Leahy Scale: Poor Sitting balance - Comments: fatigued quickly, unable to maintain >20seconds                                     Pertinent Vitals/Pain Pain Assessment: Faces Faces Pain Scale: Hurts a little bit Pain Location: his back Pain Intervention(s): Limited activity within patient's tolerance;Monitored during session;Repositioned    Home Living Family/patient expects to be discharged to:: Private residence Living Arrangements: Alone Available Help at Discharge: Family;Other (  Comment) (daughter lives nearby, works, has a family) Type of Home: House Home Access: Ramped entrance       Home Layout: One level Home Equipment: Wheelchair - Lawyer Comments: WC is functional, off-road tires, 8ya has to combine parts from two broken WC to make his functional. High profile roho cushion ~ 1year. Sleeps in regular bed at home.    Prior Function               Mobility Comments: stated his current WC is falling apart. Also reports his house is current unsafe. no  hot water or heat, electricity works but something seems to be wrong with it per pt ADLs Comments: normally modI     Hand Dominance   Dominant Hand: Left    Extremity/Trunk Assessment   Upper Extremity Assessment Upper Extremity Assessment: Defer to OT evaluation    Lower Extremity Assessment Lower Extremity Assessment:  (paraplegia, pt complete SCI)    Cervical / Trunk Assessment Cervical / Trunk Assessment: Kyphotic  Communication   Communication: No difficulties  Cognition Arousal/Alertness: Awake/alert Behavior During Therapy: WFL for tasks assessed/performed Overall Cognitive Status: Within Functional Limits for tasks assessed                                          General Comments      Exercises     Assessment/Plan    PT Assessment Patient needs continued PT services  PT Problem List Decreased activity tolerance;Decreased balance;Decreased strength;Decreased mobility       PT Treatment Interventions DME instruction;Therapeutic exercise;Wheelchair mobility training;Balance training;Neuromuscular re-education;Functional mobility training;Therapeutic activities;Patient/family education    PT Goals (Current goals can be found in the Care Plan section)  Acute Rehab PT Goals Patient Stated Goal: to return to PLOF PT Goal Formulation: With patient Time For Goal Achievement: 04/10/21 Potential to Achieve Goals: Good Additional Goals Additional Goal #1: Pt will be able to propel WC independently at least 170ft    Frequency Min 2X/week   Barriers to discharge Decreased caregiver support      Co-evaluation               AM-PAC PT "6 Clicks" Mobility  Outcome Measure Help needed turning from your back to your side while in a flat bed without using bedrails?: A Little Help needed moving from lying on your back to sitting on the side of a flat bed without using bedrails?: A Lot Help needed moving to and from a bed to a chair (including  a wheelchair)?: A Lot Help needed standing up from a chair using your arms (e.g., wheelchair or bedside chair)?: Total Help needed to walk in hospital room?: Total Help needed climbing 3-5 steps with a railing? : Total 6 Click Score: 10    End of Session   Activity Tolerance: Patient limited by fatigue Patient left: in bed;with call bell/phone within reach;with bed alarm set Nurse Communication: Mobility status PT Visit Diagnosis: Other abnormalities of gait and mobility (R26.89);Difficulty in walking, not elsewhere classified (R26.2);Muscle weakness (generalized) (M62.81)    Time: 9476-5465 PT Time Calculation (min) (ACUTE ONLY): 31 min   Charges:   PT Evaluation $PT Eval Moderate Complexity: 1 Mod PT Treatments $Therapeutic Activity: 23-37 mins      Olga Coaster PT, [name] 12:35 PM,03/27/21

## 2021-03-27 NOTE — TOC Progression Note (Signed)
Transition of Care [facility]) - Progression Note    Patient Details  Name: Jonathon York MRN: [id number] Date of Birth: 1976-04-28  Transition of Care [facility]) CM/SW Contact  Maree Krabbe, LCSW Phone Number: 03/27/2021, 3:22 PM  Clinical Narrative:   CSW is in search of a SNF for pt to go to and transition to LTC. [facility] declined. CSW has sent referral to [address] in [place].          Expected Discharge Plan and Services                                                 Social Determinants of Health (SDOH) Interventions    Readmission Risk Interventions No flowsheet data found.

## 2021-03-28 DIAGNOSIS — R6521 Severe sepsis with septic shock: Secondary | ICD-10-CM | POA: Diagnosis not present

## 2021-03-28 DIAGNOSIS — A419 Sepsis, unspecified organism: Secondary | ICD-10-CM | POA: Diagnosis not present

## 2021-03-28 LAB — COMPREHENSIVE METABOLIC PANEL
ALT: 17 U/L (ref 0–44)
AST: 20 U/L (ref 15–41)
Albumin: 2.2 g/dL — ABNORMAL LOW (ref 3.5–5.0)
Alkaline Phosphatase: 45 U/L (ref 38–126)
Anion gap: 3 — ABNORMAL LOW (ref 5–15)
BUN: 9 mg/dL (ref 6–20)
CO2: 24 mmol/L (ref 22–32)
Calcium: 7.6 mg/dL — ABNORMAL LOW (ref 8.9–10.3)
Chloride: 107 mmol/L (ref 98–111)
Creatinine, Ser: 0.39 mg/dL — ABNORMAL LOW (ref 0.61–1.24)
GFR, Estimated: >60 mL/min (ref >60)
Glucose, Bld: 126 mg/dL — ABNORMAL HIGH (ref 70–99)
Potassium: 3.2 mmol/L — ABNORMAL LOW (ref 3.5–5.1)
Sodium: 134 mmol/L — ABNORMAL LOW (ref 135–145)
Total Bilirubin: 0.4 mg/dL (ref 0.3–1.2)
Total Protein: 5.3 g/dL — ABNORMAL LOW (ref 6.5–8.1)

## 2021-03-28 LAB — CBC
HCT: 21.4 % — ABNORMAL LOW (ref 39.0–52.0)
Hemoglobin: 6.7 g/dL — ABNORMAL LOW (ref 13.0–17.0)
MCH: 23.1 pg — ABNORMAL LOW (ref 26.0–34.0)
MCHC: 31.3 g/dL (ref 30.0–36.0)
MCV: 73.8 fL — ABNORMAL LOW (ref 80.0–100.0)
Platelets: 117 10*3/uL — ABNORMAL LOW (ref 150–400)
RBC: 2.9 MIL/uL — ABNORMAL LOW (ref 4.22–5.81)
RDW: 22.6 % — ABNORMAL HIGH (ref 11.5–15.5)
WBC: 9.1 10*3/uL (ref 4.0–10.5)
nRBC: 0 % (ref 0.0–0.2)

## 2021-03-28 LAB — HEMOGLOBIN AND HEMATOCRIT, BLOOD
HCT: 26.7 % — ABNORMAL LOW (ref 39.0–52.0)
Hemoglobin: 8.4 g/dL — ABNORMAL LOW (ref 13.0–17.0)

## 2021-03-28 LAB — PREPARE RBC (CROSSMATCH)

## 2021-03-28 MED ORDER — SODIUM CHLORIDE 0.9 % IV BOLUS
500.0000 mL | Freq: Once | INTRAVENOUS | Status: AC
  Administered 2021-03-28: 500 mL via INTRAVENOUS

## 2021-03-28 MED ORDER — SODIUM CHLORIDE 0.9% IV SOLUTION: Freq: Once | INTRAVENOUS | Status: AC

## 2021-03-28 MED ORDER — POTASSIUM CHLORIDE CRYS ER 20 MEQ PO TBCR
40.0000 meq | EXTENDED_RELEASE_TABLET | Freq: Once | ORAL | Status: AC
  Administered 2021-03-28: 40 meq via ORAL
  Filled 2021-03-28: qty 2

## 2021-03-28 NOTE — Progress Notes (Signed)
Patient with soft BP and increased heartrate--yellow MEWs.  MD Dahal notified of vitals below and Hbg.  New orders given.  Increased vital sign frequency. No other change from previous assessment.    03/28/21 0811  Assess: MEWS Score  Temp 99 F (37.2 C)  BP 94/66  Pulse Rate (!) 111  Resp 16  Level of Consciousness Alert  SpO2 98 %  O2 Device Room Air  Patient Activity (if Appropriate) In bed  Assess: MEWS Score  MEWS Temp 0  MEWS Systolic 1  MEWS Pulse 2  MEWS RR 0  MEWS LOC 0  MEWS Score 3  MEWS Score Color Yellow  Assess: SIRS CRITERIA  SIRS Temperature  0  SIRS Pulse 1  SIRS Respirations  0  SIRS WBC 0  SIRS Score Sum  1

## 2021-03-28 NOTE — Progress Notes (Signed)
   03/28/21 0222  Assess: MEWS Score  Temp 99.9 F (37.7 C)  BP (!) 88/66  Pulse Rate (!) 120  Resp 16  SpO2 98 %  O2 Device Room Air  Assess: MEWS Score  MEWS Temp 0  MEWS Systolic 1  MEWS Pulse 2  MEWS RR 0  MEWS LOC 0  MEWS Score 3  MEWS Score Color Yellow  Assess: if the MEWS score is Yellow or Red  Were vital signs taken at a resting state? Yes  Focused Assessment No change from prior assessment  Does the patient meet 2 or more of the SIRS criteria? No  MEWS guidelines implemented *See Row Information* Yes  Treat  MEWS Interventions Escalated (See documentation below)  Take Vital Signs  Increase Vital Sign Frequency  Yellow: Q 2hr X 2 then Q 4hr X 2, if remains yellow, continue Q 4hrs  Escalate  MEWS: Escalate Yellow: discuss with charge nurse/RN and consider discussing with provider and RRT  Notify: Charge Nurse/RN  Name of Charge Nurse/RN Notified [place], RN  Date Charge Nurse/RN Notified 03/28/21  Time Charge Nurse/RN Notified 0300  Notify: Provider  Provider Name/Title Manuela Schwartz  Date Provider Notified 03/28/21  Time Provider Notified 0320  Notification Type  (secure chat)  Notification Reason Change in status  Provider response See new orders  Date of Provider Response 03/28/21  Time of Provider Response 0400  Document  Patient Outcome Stabilized after interventions  Progress note created (see row info) Yes  Assess: SIRS CRITERIA  SIRS Temperature  0  SIRS Pulse 1  SIRS Respirations  0  SIRS WBC 0  SIRS Score Sum  1

## 2021-03-28 NOTE — Care Management Important Message (Signed)
Important Message  Patient Details  Name: RONDALL RADIGAN MRN: [id number] Date of Birth: 01-12-1976   Medicare Important Message Given:  Yes     [name] 03/28/2021, 10:55 AM

## 2021-03-28 NOTE — Progress Notes (Signed)
Consulted for occluded PICC and unable to draw blood. Assessment done on PICC , found out its "positional" . Pt. Has to put R arm across chest and R PICC, both lumens flushed well with good blood return. Pt. Is aware.

## 2021-03-28 NOTE — Progress Notes (Signed)
PROGRESS NOTE  Jonathon York  DOB: Oct 28, 1975  PCP: Leonel Ramsay, MD PHX:[phone]  DOA: 03/21/2021  LOS: 7 days  Hospital Day: 8  Chief Complaint  Patient presents with   Fall   Brief narrative: Jonathon York is a 45 y.o. male with PMH significant for paraplegia for last 4 years, chronic sacral decubitus ulcers, neurogenic bladder with suprapubic catheter, osteomyelitis of sacrum who was able to transfer himself in and out of wheelchair at baseline.   Patient fell at home and could not get himself up.  He was found on the ground after about 4 days.  He states he drank his own urine during this period.  In the ED, he was found soiled, dehydrated Met sepsis criteria with chronic sacral ulcers acutely looking worse. He has started on IV antibiotics, IV fluid.   Admitted to ICU service.  Required pressors for septic shock. ID and general surgery consultation was obtained. With clinical improvement, patient transferred out to hospitalist service. See below for details.  Subjective: Patient was seen and examined this morning.  Thin built middle-aged Caucasian male.  Alert, awake.  Pain controlled.  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 -Remains on IV Zosyn.  ID following. -Significantly exposed hip joints.  Orthopedic consult called today for indication of any procedure like G[place]. -Off Levophed since 11/27.  Continue IV fluid. -Currently has a fecal bag to prevent contamination of the wound Recent Labs  Lab 03/21/21 1834 03/21/21 1950 03/21/21 1950 03/21/21 2049 03/22/21 0414 03/23/21 0542 03/24/21 0536 03/25/21 0105 03/26/21 0601 03/27/21 0611 03/28/21 0537  WBC  --  10.4   < >  --  9.9 7.8 11.3* 11.3* 8.1 9.4 9.1  LATICACIDVEN  --  1.5  --  1.0  --   --    --   --   --   --   --   PROCALCITON 85.62  --   --   --  36.94 9.21  --   --   --   --   --    < > = values in this interval not displayed.   Acute on chronic blood loss anemia -Ferritin low at 39. Received 2 units of PRBC so far.  Hemoglobin 6.7 today.  1 more unit of PRBC transfusion ordered this morning. Recent Labs    08/31/20 0602 09/01/20 0508 03/25/21 0105 03/26/21 0601 03/26/21 2120 03/27/21 0611 03/28/21 0537  HGB 10.0*   < > 7.0* 6.9* 7.5* 7.7* 6.7*  MCV 72.5*   < > 71.6* 72.3*  --  73.9* 73.8*  FERRITIN 39  --   --   --   --   --   --   TIBC 211*  --   --   --   --   --   --   IRON 21*  --   --   --   --   --   --    < > = values in this interval not displayed.   Thrombocytopenia Recent Labs  Lab 03/21/21 1950 03/22/21 0414 03/23/21 0542 03/24/21 0536 03/25/21 0105 03/26/21 0601 03/27/21 0611 03/28/21 0537  PLT 104* 97* 80* 78* 83* 85* 92* 117*   Severe protein calorie malnutrition Refeeding syndrome -Dietitian consult appreciated.  Goals of care -Poor prognosis because of significant progressive sacral.  Palliative  care consult appreciated   Mobility: Wheelchair-bound Living condition: Lives at home alone Goals of care:   Code Status: Full Code  Nutritional status: Body mass index is 13.29 kg/m. Nutrition Problem: Severe Malnutrition Etiology: social / environmental circumstances Signs/Symptoms: energy intake < or equal to 75% for > or equal to 1 month, moderate fat depletion, severe fat depletion, moderate muscle depletion, severe muscle depletion, percent weight loss Percent weight loss: 15.8 % Diet:  Diet Order             Diet regular Room service appropriate? Yes; Fluid consistency: Thin  Diet effective now                  DVT prophylaxis:  SCDs Start: 03/21/21 2240   Antimicrobials: IV Zosyn Fluid: Not on IV fluid Consultants: ID, surgery, orthopedics Family Communication: None at bedside  Status is: Inpatient  Remains  inpatient appropriate because: Needs further management of the ulcer wounds  Dispo: The patient is from: Home              Anticipated d/c is to: Unclear at this time if patient needs transfer to tertiary care center              Patient currently is not medically stable to d/c.   Difficult to place patient No     Infusions:   sodium chloride Stopped (03/23/21 2251)   piperacillin-tazobactam (ZOSYN)  IV 3.375 g (03/28/21 1319)    Scheduled Meds:  vitamin C  500 mg Oral BID   Chlorhexidine Gluconate Cloth  6 each Topical Daily   collagenase   Topical BID   feeding supplement  237 mL Oral TID BM   multivitamin-lutein  1 capsule Oral Daily   sodium chloride flush  10-40 mL Intracatheter Q12H    PRN meds: acetaminophen, HYDROcodone-acetaminophen, HYDROmorphone, magic mouthwash w/lidocaine, polyethylene glycol, traZODone   Antimicrobials: Anti-infectives (From admission, onward)    Start     Dose/Rate Route Frequency Ordered Stop   03/27/21 1508  piperacillin-tazobactam (ZOSYN) 3.375 GM/50ML IVPB       Note to Pharmacy: Joycelyn Das K: cabinet override      03/27/21 1508 03/28/21 0314   03/27/21 0600  piperacillin-tazobactam (ZOSYN) IVPB 3.375 g        3.375 g 12.5 mL/hr over 240 Minutes Intravenous Every 8 hours 03/26/21 2329     03/27/21 0517  piperacillin-tazobactam (ZOSYN) 3.375 GM/50ML IVPB       Note to Pharmacy: Kermit Balo E: cabinet override      03/27/21 0517 03/27/21 1729   03/25/21 1000  cefTRIAXone (ROCEPHIN) 2 g in sodium chloride 0.9 % 100 mL IVPB  Status:  Discontinued        2 g 200 mL/hr over 30 Minutes Intravenous Every 24 hours 03/24/21 1510 03/26/21 2329   03/24/21 2000  vancomycin (VANCOCIN) IVPB 1000 mg/200 mL premix  Status:  Discontinued        1,000 mg 200 mL/hr over 60 Minutes Intravenous Every 24 hours 03/24/21 0858 03/24/21 1026   03/24/21 2000  vancomycin (VANCOREADY) IVPB 1000 mg/200 mL  Status:  Discontinued        1,000 mg 200 mL/hr over  60 Minutes Intravenous Every 24 hours 03/24/21 1026 03/25/21 0902   03/22/21 2000  vancomycin (VANCOREADY) IVPB 1250 mg/250 mL  Status:  Discontinued        1,250 mg 166.7 mL/hr over 90 Minutes Intravenous Every 24 hours 03/22/21 1159 03/24/21 0858   03/22/21 0600  piperacillin-tazobactam (ZOSYN) IVPB 3.375 g        3.375 g 12.5 mL/hr over 240 Minutes Intravenous Every 8 hours 03/21/21 2216 03/24/21 2359   03/21/21 2217  vancomycin variable dose per unstable renal function (pharmacist dosing)  Status:  Discontinued         Does not apply See admin instructions 03/21/21 2217 03/24/21 0859   03/21/21 1900  ceFEPIme (MAXIPIME) 2 g in sodium chloride 0.9 % 100 mL IVPB        2 g 200 mL/hr over 30 Minutes Intravenous  Once 03/21/21 1851 03/21/21 2122   03/21/21 1900  metroNIDAZOLE (FLAGYL) IVPB 500 mg        500 mg 100 mL/hr over 60 Minutes Intravenous  Once 03/21/21 1851 03/21/21 2027   03/21/21 1900  vancomycin (VANCOCIN) IVPB 1000 mg/200 mL premix        1,000 mg 200 mL/hr over 60 Minutes Intravenous  Once 03/21/21 1851 03/21/21 2151       Objective: Vitals:   03/28/21 1205 03/28/21 1223  BP: (!) 87/55 (!) 93/57  Pulse: (!) 106 (!) 108  Resp:    Temp: 98.7 F (37.1 C) 98.6 F (37 C)  SpO2: 100% 100%    Intake/Output Summary (Last 24 hours) at 03/28/2021 1425 Last data filed at 03/28/2021 1205 Gross per 24 hour  Intake 2711.81 ml  Output 2050 ml  Net 661.81 ml   Filed Weights   03/23/21 0500 03/24/21 0500 03/25/21 0500  Weight: 42.6 kg 42.6 kg 42 kg   Weight change:  Body mass index is 13.29 kg/m.   Physical Exam: General exam: Middle-aged Caucasian male.  Thin built Skin: No rashes, lesions or ulcers. HEENT: Atraumatic, normocephalic, no obvious bleeding Lungs: Clear to auscultation bilaterally CVS: Regular rate and rhythm, no murmur GI/Abd soft, nontender, nondistended, bowel sound present Back: Decubitus ulcers bandaged CNS: Alert, awake, oriented x3,  paraplegia at baseline Psychiatry: Sad affect Extremities: No pedal edema, no calf tenderness  Data Review: I have personally reviewed the laboratory data and studies available.  F/u labs ordered Unresulted Labs (From admission, onward)     Start     Ordered   03/23/21 1602  MRSA Next Gen by PCR, Nasal  ONCE - STAT,   STAT        03/23/21 1602   Unscheduled  CBC with Differential/Platelet  Daily,   R     Question:  Specimen collection method  Answer:  Unit=Unit collect   03/28/21 1425   Unscheduled  Basic metabolic panel  Daily,   R     Question:  Specimen collection method  Answer:  Unit=Unit collect   03/28/21 1425            Signed, Terrilee Croak, MD [facility] 03/28/2021

## 2021-03-28 NOTE — Evaluation (Signed)
Occupational Therapy Evaluation Patient Details Name: Jonathon York MRN: [id number] DOB: 1975/11/10 Today's Date: 03/28/2021   History of Present Illness Cordelro Gautreau is a 45yoM who comes to [facility] after a fall out of bed and being on the floor for 4 days. PMH of  pressure ulcers at sacrum, Left ischium, Rt ischium, fall c T12 SCI (aged 45 per pt), paraplegia c WC mobility, neurogenic bladder, foot amputations, anxiety .   Clinical Impression   Pt seen for OT evaluation this date. Pt pleasant and motivated to participate throughout session. Pt reports being MOD-I with WC for ADLs, IADLs, and functional mobility at baseline. Prior to admission, pt was living in a 1-level home alone. Pt currently requires MIN GUARD for supine>sit transfer with HOB elevated d/t decreased strength and pt reporting mild dizziness following transfer. Once seated EOB, pt tolerated sitting unsupported for 7 mins while participating in UB ADLs and seated UB therex (see below), with HR 120-130; RN informed. Pt required MOD A for managing LE back to bed during sit>supine transfer d/t decreased strength and activity tolerance. Soiled chuck linen changed and pt left in bed, in no acute distress with provider present. Pt would benefit from additional skilled OT services to maximize return to PLOF and minimize risk of future falls, injury, caregiver burden, and readmission. Upon discharge, recommend SNF services as pt demonstrates significant decline in functional abilities.      Recommendations for follow up therapy are one component of a multi-disciplinary discharge planning process, led by the attending physician.  Recommendations may be updated based on patient status, additional functional criteria and insurance authorization.   Follow Up Recommendations  Skilled nursing-short term rehab (<3 hours/day)    Assistance Recommended at Discharge Frequent or constant Supervision/Assistance  Functional Status Assessment   Patient has had a recent decline in their functional status and demonstrates the ability to make significant improvements in function in a reasonable and predictable amount of time.  Equipment Recommendations  Other (comment) (defer to next venue of care)       Precautions / Restrictions Precautions Precautions: Fall Restrictions Weight Bearing Restrictions: No Other Position/Activity Restrictions: paraplegia, pressure ulcers at sacrum      Mobility Bed Mobility Overal bed mobility: Needs Assistance Bed Mobility: Rolling;Supine to Sit;Sit to Supine Rolling: Min assist   Supine to sit: Min guard;HOB elevated Sit to supine: Mod assist   General bed mobility comments: Requires MOD A to return LE to bed. With bed in Trendelenburg position, pt able to use UE to pull body toward [facility]    Transfers                   General transfer comment: pt unable at this time; fatigue, inadequate equipment to perform safely (pt does not have his WC at hospital)      Balance Overall balance assessment: Needs assistance Sitting-balance support: No upper extremity supported;Feet unsupported Sitting balance-Leahy Scale: Fair Sitting balance - Comments: Pt able to maintain sitting balance at EOB for 7 mins while engaging in UB ADLs and UB therex. Requires MIN GUARD d/t pt reporting mild dizziness following supine>sit transfer                                   ADL either performed or assessed with clinical judgement   ADL Overall ADL's : Needs assistance/impaired  General ADL Comments: Requires MIN GUARD for seated UB ADLs at EOB d/t pt reporting mild dizziness. Requires MAX A for LB ADLs d/t decreased strength and decreased sitting balance     Vision Ability to See in Adequate Light: 0 Adequate Patient Visual Report: No change from baseline              Pertinent Vitals/Pain Pain Assessment: Faces Faces Pain  Scale: Hurts little more Pain Location: "all over from being on the floor for days" Pain Descriptors / Indicators: Aching Pain Intervention(s): Limited activity within patient's tolerance;Monitored during session     Hand Dominance Left   Extremity/Trunk Assessment Upper Extremity Assessment Upper Extremity Assessment: Overall WFL for tasks assessed (shoulder flex 3+/5, elbow flex/ext 4-/5, grip strength 5/5)   Lower Extremity Assessment Lower Extremity Assessment:  (paraplegia, pt complete SCI)   Cervical / Trunk Assessment Cervical / Trunk Assessment: Kyphotic   Communication Communication Communication: No difficulties   Cognition Arousal/Alertness: Awake/alert Behavior During Therapy: WFL for tasks assessed/performed Overall Cognitive Status: Within Functional Limits for tasks assessed                                       General Comments  Pt with elevated HR (120-130) during bed mobility and seated UB therex, returning to initial resting HR of 100-110s at end of session.    Exercises General Exercises - Upper Extremity Shoulder Flexion: Strengthening;Both;10 reps;Seated;Theraband Theraband Level (Shoulder Flexion): Level 4 (Blue) Shoulder ABduction: Strengthening;Both;10 reps;Seated Elbow Flexion: Strengthening;Both;20 reps;Seated;Theraband Theraband Level (Elbow Flexion): Level 4 (Blue) Other Exercises Other Exercises: Educated pt on role of OT, POC, and d/c recommendations, with pt verbalizing understanding        Home Living Family/patient expects to be discharged to:: Private residence Living Arrangements: Alone Available Help at Discharge: Family;Other (Comment) (daughter lives nearby, works, has a family) Type of Home: House Home Access: Ramped entrance     Home Layout: One level     Bathroom Shower/Tub: Chief Strategy Officer: Handicapped height Bathroom Accessibility: Yes   Home Equipment: Wheelchair - manual;Shower  seat;Grab bars - tub/shower   Additional Comments: WC is functional, off-road tires, 8ya has to combine parts from two broken WC to make his functional. High profile roho cushion ~ 1year. Sleeps in regular bed at home.      Prior Functioning/Environment               Mobility Comments: stated his current WC is falling apart. Also reports his house is current unsafe. no hot water or heat, electricity works but something seems to be wrong with it per pt ADLs Comments: Pt reports being MOD-I with WC for ADLs, IADLs, and functional mobility        OT Problem List: Decreased strength;Decreased activity tolerance;Impaired balance (sitting and/or standing);Pain      OT Treatment/Interventions: Self-care/ADL training;Therapeutic exercise;Energy conservation;Therapeutic activities;Patient/family education;Balance training    OT Goals(Current goals can be found in the care plan section) Acute Rehab OT Goals Patient Stated Goal: to be able to transfer to wheelchair OT Goal Formulation: With patient Time For Goal Achievement: 04/11/21 Potential to Achieve Goals: Good ADL Goals Pt Will Perform Upper Body Bathing: with set-up;sitting Pt Will Perform Lower Body Bathing: with min assist;sitting/lateral leans Pt/caregiver will Perform Home Exercise Program: Both right and left upper extremity;With theraband;Independently  OT Frequency: Min 2X/week    AM-PAC OT "6 Clicks"  Daily Activity     Outcome Measure Help from another person eating meals?: None Help from another person taking care of personal grooming?: A Little Help from another person toileting, which includes using toliet, bedpan, or urinal?: Total (foley and fecal management system) Help from another person bathing (including washing, rinsing, drying)?: A Lot Help from another person to put on and taking off regular upper body clothing?: A Little Help from another person to put on and taking off regular lower body clothing?: A  Lot 6 Click Score: 15   End of Session Equipment Utilized During Treatment: Other (comment) (theraband) Nurse Communication: Mobility status;Other (comment) (HR during mobility)  Activity Tolerance: Patient tolerated treatment well Patient left: in bed;with call bell/phone within reach;with bed alarm set  OT Visit Diagnosis: History of falling (Z91.81);Muscle weakness (generalized) (M62.81);Pain                Time: 0940-7680 OT Time Calculation (min): 28 min Charges:  OT General Charges $OT Visit: 1 Visit OT Evaluation $OT Eval Moderate Complexity: 1 Mod OT Treatments $Therapeutic Activity: 8-22 mins  Matthew Folks, OTR/L [facility] [phone]

## 2021-03-28 NOTE — Progress Notes (Signed)
PT Cancellation Note  Patient Details Name: Jonathon York MRN: [id number] DOB: March 16, 1976   Cancelled Treatment:     PT attempt. Per chart review, pt has had Mews of 2 (low BP/elevated HR) + will be requiring transfusion for low Hgb 6.7. Author will return later to treat if pt becomes more appropriate to participate.    Rushie Chestnut 03/28/2021, 10:03 AM

## 2021-03-28 NOTE — Progress Notes (Signed)
   Date of Admission:  03/21/2021      ID: Jonathon York is a 45 y.o. male  Principal Problem:   Sepsis (HCC) Active Problems:   Acute on chronic anemia   AKI (acute kidney injury) (HCC)    Subjective: Pt is upset that he is being sent to NH without any plan for the rt hip  wound Feeling a bit stronger No fever  Medications:   sodium chloride   Intravenous Once   vitamin C  500 mg Oral BID   Chlorhexidine Gluconate Cloth  6 each Topical Daily   collagenase   Topical BID   feeding supplement  237 mL Oral TID BM   multivitamin-lutein  1 capsule Oral Daily   sodium chloride flush  10-40 mL Intracatheter Q12H    Objective: Vital signs in last 24 hours: Temp:  [98 F (36.7 C)-99.9 F (37.7 C)] 98.4 F (36.9 C) (11/30 1111) Pulse Rate:  [99-125] 110 (11/30 1111) Resp:  [16-20] 20 (11/30 1111) BP: (81-109)/(54-73) 87/57 (11/30 1111) SpO2:  [94 %-100 %] 99 % (11/30 1111)  PHYSICAL EXAM:  General: awake and alert, emaciated, pale Head: Normocephalic, without obvious abnormality, atraumatic. Eyes: Conjunctivae clear, anicteric sclerae. Pupils are equal ENT Nares normal. No drainage or sinus tenderness. Lips, pin point ulceration Neck: , symmetrical, no adenopathy, thyroid: non tender no carotid bruit and no JVD. Sacral and hip wounds  Rt hip- bone/hip joint sticking  thru the wound- extensive loss of tissue      Left hip wound- tunneling . Has some ischemic skin margin Sacral wound clean Lungs: b/l air entry Heart: Regular rate and rhythm, no murmur, rub or gallop. Abdomen: Soft, non-tender,not distended. Bowel sounds normal. No masses Extremities:rt PICC wasting of lower extremities Skin: No rashes or lesions. Or bruising Lymph: Cervical, supraclavicular normal. Neurologic: paraplegia  Lab Results Recent Labs    03/27/21 0611 03/28/21 0537  WBC 9.4 9.1  HGB 7.7* 6.7*  HCT 24.9* 21.4*  NA 134* 134*  K 4.1 3.2*  CL 105 107  CO2 24 24  BUN 8 9   CREATININE <0.30* 0.39*   Liver Panel Recent Labs    03/27/21 0611 03/28/21 0537  PROT 5.8* 5.3*  ALBUMIN 2.7* 2.2*  AST 29 20  ALT 21 17  ALKPHOS 54 45  BILITOT 0.9 0.4   Microbiology: 03/21/21 Urine culture morganella BC strep anginosis   Studies/Results: No results found.   Assessment/Plan:  Fall and stayed on the floor for 4 days  Stage IV wounds both hips and sacrum Rt hip joint out of the wound-osteo /septic arthritis  is bound to be present-  this is never going to heal- ortho consult for possible girdlestone surgery and flap /graft which may also not be possible due to poor nutritional status- continue zosyn- needs higher level of care Palliative on board-  Will treat with a prolonged course of antibiotic   Strep anginosus bacteremia- on zosyn  Severe anemia  Thrombocytopenia due to infection  Severe malnutrition/muscle wasting Hypoalbuminemia Nutritionist seen patient  Discussed the management with patient and hospitalist. Pt needs transfer to higher level of care.

## 2021-03-29 DIAGNOSIS — A419 Sepsis, unspecified organism: Secondary | ICD-10-CM | POA: Diagnosis not present

## 2021-03-29 DIAGNOSIS — R6521 Severe sepsis with septic shock: Secondary | ICD-10-CM | POA: Diagnosis not present

## 2021-03-29 LAB — BASIC METABOLIC PANEL
Anion gap: 4 — ABNORMAL LOW (ref 5–15)
BUN: 10 mg/dL (ref 6–20)
CO2: 26 mmol/L (ref 22–32)
Calcium: 7.9 mg/dL — ABNORMAL LOW (ref 8.9–10.3)
Chloride: 105 mmol/L (ref 98–111)
Creatinine, Ser: <0.3 mg/dL — ABNORMAL LOW (ref 0.61–1.24)
Glucose, Bld: 88 mg/dL (ref 70–99)
Potassium: 3.6 mmol/L (ref 3.5–5.1)
Sodium: 135 mmol/L (ref 135–145)

## 2021-03-29 LAB — CBC WITH DIFFERENTIAL/PLATELET
Abs Immature Granulocytes: 0.06 10*3/uL (ref 0.00–0.07)
Basophils Absolute: 0 10*3/uL (ref 0.0–0.1)
Basophils Relative: 0 %
Eosinophils Absolute: 0.1 10*3/uL (ref 0.0–0.5)
Eosinophils Relative: 1 %
HCT: 26.6 % — ABNORMAL LOW (ref 39.0–52.0)
Hemoglobin: 8.5 g/dL — ABNORMAL LOW (ref 13.0–17.0)
Immature Granulocytes: 1 %
Lymphocytes Relative: 15 %
Lymphs Abs: 1.3 10*3/uL (ref 0.7–4.0)
MCH: 24.6 pg — ABNORMAL LOW (ref 26.0–34.0)
MCHC: 32 g/dL (ref 30.0–36.0)
MCV: 77.1 fL — ABNORMAL LOW (ref 80.0–100.0)
Monocytes Absolute: 0.6 10*3/uL (ref 0.1–1.0)
Monocytes Relative: 7 %
Neutro Abs: 6.7 10*3/uL (ref 1.7–7.7)
Neutrophils Relative %: 76 %
Platelets: 131 10*3/uL — ABNORMAL LOW (ref 150–400)
RBC: 3.45 MIL/uL — ABNORMAL LOW (ref 4.22–5.81)
RDW: 22.3 % — ABNORMAL HIGH (ref 11.5–15.5)
Smear Review: NORMAL
WBC: 8.8 10*3/uL (ref 4.0–10.5)
nRBC: 0 % (ref 0.0–0.2)

## 2021-03-29 LAB — BPAM RBC
Blood Product Expiration Date: 202212012359
Blood Product Expiration Date: 202212292359
ISSUE DATE / TIME: 202211281614
ISSUE DATE / TIME: 202211301159
Unit Type and Rh: 5100
Unit Type and Rh: 9500

## 2021-03-29 LAB — TYPE AND SCREEN
ABO/RH(D): O POS
Antibody Screen: NEGATIVE
Unit division: 0
Unit division: 0

## 2021-03-29 MED ORDER — HEPARIN SODIUM (PORCINE) 5000 UNIT/ML IJ SOLN
5000.0000 [IU] | Freq: Two times a day (BID) | INTRAMUSCULAR | Status: DC
  Filled 2021-03-29 (×8): qty 1

## 2021-03-29 NOTE — Progress Notes (Signed)
PROGRESS NOTE  Jonathon York  DOB: August 13, 1975  PCP: Jonathon Ramsay, MD BOF:[id number]  DOA: 03/21/2021  LOS: 8 days  Hospital Day: 9  Chief Complaint  Patient presents with   Fall   Brief narrative: Jonathon York is a 45 y.o. male with PMH significant for paraplegia for last 4 years, chronic sacral decubitus ulcers, neurogenic bladder with suprapubic catheter, osteomyelitis of sacrum who was able to transfer himself in and out of wheelchair at baseline.   Patient fell at home and could not get himself up.  He was found on the ground after about 4 days.  He states he drank his own urine during this period.  In the ED, he was found soiled, dehydrated Met sepsis criteria with chronic sacral ulcers acutely looking worse. He has started on IV antibiotics, IV fluid.   Admitted to ICU service.  Required pressors for septic shock. ID and general surgery consultation was obtained. With clinical improvement, patient transferred out to hospitalist service. See below for details.  Subjective: Patient was seen and examined this morning.  General middle-aged male.  Propped up in bed.  We discussed about the recommendations from orthopedic surgeon yesterday for which he needs to be at a tertiary care center.  Patient states he does not want to consider losing his leg.  He wants to go to [facility] for wound care and consider plastic surgery after that once the wound is better.   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 -Also seen orthopedic surgery in 11/30.  Patient has exposed bones.  He may be better served at a tertiary care center.  However he is afraid of losing his legs.  He wants to try to go to [facility] for wound care with wound VAC before considering further surgical intervention. -Remains on IV Zosyn.  ID  following. -Off Levophed since 11/27.  Continue IV fluid. -Currently has a fecal bag to prevent contamination of the wound  Acute on chronic blood loss anemia -Ferritin low at 39. Received 3 units of PRBC so far.  Hemoglobin at 8.5 today.  Recent Labs    08/31/20 0602 09/01/20 0508 03/26/21 2120 03/27/21 0611 03/28/21 0537 03/28/21 2120 03/29/21 0540  HGB 10.0*   < > 7.5* 7.7* 6.7* 8.4* 8.5*  MCV 72.5*   < >  --  73.9* 73.8*  --  77.1*  FERRITIN 39  --   --   --   --   --   --   TIBC 211*  --   --   --   --   --   --   IRON 21*  --   --   --   --   --   --    < > = values in this interval not displayed.   Thrombocytopenia Recent Labs  Lab 03/23/21 0542 03/24/21 0536 03/25/21 0105 03/26/21 0601 03/27/21 0611 03/28/21 0537 03/29/21 0540  PLT 80* 78* 83* 85* 92* 117* 131*   Severe protein calorie malnutrition Refeeding syndrome -Dietitian consult appreciated.  Goals of care -Poor prognosis because of significant progressive sacral.  Palliative care consult appreciated   Mobility: Wheelchair-bound Living condition: Lives at home alone Goals of care:   Code Status: Full Code  Nutritional status: Body mass index is 14.9 kg/m. Nutrition Problem: Severe Malnutrition Etiology: social / environmental circumstances Signs/Symptoms: energy intake <  or equal to 75% for > or equal to 1 month, moderate fat depletion, severe fat depletion, moderate muscle depletion, severe muscle depletion, percent weight loss Percent weight loss: 15.8 % Diet:  Diet Order             Diet regular Room service appropriate? Yes; Fluid consistency: Thin  Diet effective now                  DVT prophylaxis:  SCDs Start: 03/21/21 2240   Antimicrobials: IV Zosyn Fluid: Not on IV fluid Consultants: ID, surgery, orthopedics Family Communication: None at bedside  Status is: Inpatient  Remains inpatient appropriate because: Needs further management of the ulcer wounds  Dispo: The  patient is from: Home              Anticipated d/c is to: [facility] care center versus LTAC.              Patient currently is not medically stable to d/c.   Difficult to place patient No     Infusions:   sodium chloride Stopped (03/23/21 2251)   piperacillin-tazobactam (ZOSYN)  IV 3.375 g (03/29/21 0530)    Scheduled Meds:  vitamin C  500 mg Oral BID   Chlorhexidine Gluconate Cloth  6 each Topical Daily   collagenase   Topical BID   feeding supplement  237 mL Oral TID BM   multivitamin-lutein  1 capsule Oral Daily   sodium chloride flush  10-40 mL Intracatheter Q12H    PRN meds: acetaminophen, HYDROcodone-acetaminophen, HYDROmorphone, magic mouthwash w/lidocaine, polyethylene glycol, traZODone   Antimicrobials: Anti-infectives (From admission, onward)    Start     Dose/Rate Route Frequency Ordered Stop   03/27/21 1508  piperacillin-tazobactam (ZOSYN) 3.375 GM/50ML IVPB       Note to Pharmacy: Joycelyn Das K: cabinet override      03/27/21 1508 03/28/21 0314   03/27/21 0600  piperacillin-tazobactam (ZOSYN) IVPB 3.375 g        3.375 g 12.5 mL/hr over 240 Minutes Intravenous Every 8 hours 03/26/21 2329     03/27/21 0517  piperacillin-tazobactam (ZOSYN) 3.375 GM/50ML IVPB       Note to Pharmacy: Kermit Balo E: cabinet override      03/27/21 0517 03/27/21 1729   03/25/21 1000  cefTRIAXone (ROCEPHIN) 2 g in sodium chloride 0.9 % 100 mL IVPB  Status:  Discontinued        2 g 200 mL/hr over 30 Minutes Intravenous Every 24 hours 03/24/21 1510 03/26/21 2329   03/24/21 2000  vancomycin (VANCOCIN) IVPB 1000 mg/200 mL premix  Status:  Discontinued        1,000 mg 200 mL/hr over 60 Minutes Intravenous Every 24 hours 03/24/21 0858 03/24/21 1026   03/24/21 2000  vancomycin (VANCOREADY) IVPB 1000 mg/200 mL  Status:  Discontinued        1,000 mg 200 mL/hr over 60 Minutes Intravenous Every 24 hours 03/24/21 1026 03/25/21 0902   03/22/21 2000  vancomycin (VANCOREADY) IVPB 1250 mg/250  mL  Status:  Discontinued        1,250 mg 166.7 mL/hr over 90 Minutes Intravenous Every 24 hours 03/22/21 1159 03/24/21 0858   03/22/21 0600  piperacillin-tazobactam (ZOSYN) IVPB 3.375 g        3.375 g 12.5 mL/hr over 240 Minutes Intravenous Every 8 hours 03/21/21 2216 03/24/21 2359   03/21/21 2217  vancomycin variable dose per unstable renal function (pharmacist dosing)  Status:  Discontinued  Does not apply See admin instructions 03/21/21 2217 03/24/21 0859   03/21/21 1900  ceFEPIme (MAXIPIME) 2 g in sodium chloride 0.9 % 100 mL IVPB        2 g 200 mL/hr over 30 Minutes Intravenous  Once 03/21/21 1851 03/21/21 2122   03/21/21 1900  metroNIDAZOLE (FLAGYL) IVPB 500 mg        500 mg 100 mL/hr over 60 Minutes Intravenous  Once 03/21/21 1851 03/21/21 2027   03/21/21 1900  vancomycin (VANCOCIN) IVPB 1000 mg/200 mL premix        1,000 mg 200 mL/hr over 60 Minutes Intravenous  Once 03/21/21 1851 03/21/21 2151       Objective: Vitals:   03/29/21 0536 03/29/21 0709  BP: 95/65 102/69  Pulse: (!) 108 (!) 107  Resp: 18 18  Temp: 98.3 F (36.8 C) 98.4 F (36.9 C)  SpO2: 100% 97%    Intake/Output Summary (Last 24 hours) at 03/29/2021 1106 Last data filed at 03/29/2021 1013 Gross per 24 hour  Intake 1733.23 ml  Output 2450 ml  Net -716.77 ml   Filed Weights   03/24/21 0500 03/25/21 0500 03/29/21 0500  Weight: 42.6 kg 42 kg 47.1 kg   Weight change:  Body mass index is 14.9 kg/m.   Physical Exam: General exam: Middle-aged Caucasian male.  Thin built Skin: No rashes, lesions or ulcers. HEENT: Atraumatic, normocephalic, no obvious bleeding Lungs: Clear to auscultation bilaterally CVS: Regular rate and rhythm, no murmur GI/Abd soft, nontender, nondistended, bowel sound present Back: Decubitus ulcers bandaged CNS: Alert, awake, oriented x3, paraplegia at baseline Psychiatry: Sad affect Extremities: No pedal edema, no calf tenderness  Data Review: I have personally  reviewed the laboratory data and studies available.  F/u labs ordered Unresulted Labs (From admission, onward)     Start     Ordered   03/29/21 0500  CBC with Differential/Platelet  Daily,   R     Question:  Specimen collection method  Answer:  Unit=Unit collect   03/28/21 1425   03/29/21 0923  Basic metabolic panel  Daily,   R     Question:  Specimen collection method  Answer:  Unit=Unit collect   03/28/21 1425   03/23/21 1602  MRSA Next Gen by PCR, Nasal  ONCE - STAT,   STAT        03/23/21 1602            Signed, Terrilee Croak, MD [facility] 03/29/2021

## 2021-03-29 NOTE — Progress Notes (Signed)
OT Cancellation Note  Patient Details Name: Jonathon York MRN: [id number] DOB: Jul 05, 1975   Cancelled Treatment:    Reason Eval/Treat Not Completed: Other (comment) (Pt declined stating that he had already worked with PT this morning and was done for the rest of the day.  Pt declined another attempt later today.)  OT encouraged continued use of theraband for BUE strengthening.  Pt agreed.  Danelle Earthly, MS, OTR/L  Jonathon York 03/29/2021, 10:22 AM

## 2021-03-29 NOTE — Progress Notes (Signed)
Physical Therapy Treatment Patient Details Name: Jonathon York MRN: [id number] DOB: [birth year] Today's Date: 03/29/2021   History of Present Illness Jonathon York is a 45yoM who comes to [facility] after a fall out of bed and being on the floor for 4 days. PMH of  pressure ulcers at sacrum, Left ischium, Rt ischium, fall c T12 SCI (aged 45 per pt), paraplegia c WC mobility, neurogenic bladder, foot amputations, anxiety .    PT Comments    Pt was long sitting in bed upon arriving. He is A and O x 4 and a good historian of past medical history. HR at rest between 110 bpm and 125 bpm. BP in supine 108/78. He was able to perform long sitting to short sit EOB with Min assist + increased time. Pt uses BUE to progress LEs to EOB. BP in sitting 104/75 (85). No symptoms of dizziness or lightheadedness reported. Sat EOB x 15 minutes while performing upper body ROM/strengthening while author performed gentle LE stretching. He requested not to get OOB today but requested to get OOB > w/c tomorrow. Author will bring a w/c and assist pt with OOB activity tomorrow. Overall pt tolerated session well. Applied Prevlon boots post session to decrease likelihood of skin breakdown. Rehab post acute hospitalization continues to be most appropriate.    Recommendations for follow up therapy are one component of a multi-disciplinary discharge planning process, led by the attending physician.  Recommendations may be updated based on patient status, additional functional criteria and insurance authorization.  Follow Up Recommendations  Skilled nursing-short term rehab (<3 hours/day)     Assistance Recommended at Discharge Frequent or constant Supervision/Assistance  Equipment Recommendations  Other (comment);Wheelchair (measurements PT);Wheelchair cushion (measurements PT) (pt needs new W/C)       Precautions / Restrictions Precautions Precautions: Fall Restrictions Weight Bearing Restrictions: No     Mobility  Bed  Mobility Overal bed mobility: Needs Assistance Bed Mobility: Rolling;Supine to Sit;Sit to Supine Rolling: Min assist Sidelying to sit: Min assist Supine to sit: Min assist Sit to supine: Mod assist        Transfers      General transfer comment: pt requested to hold on  transfers till next session. He is requesting to get into a w/c to be able to self propel and work on UE strength    Ambulation/Gait    General Gait Details: non ambulatory at baseline    Balance Overall balance assessment: Needs assistance Sitting-balance support: No upper extremity supported;Feet unsupported Sitting balance-Leahy Scale: Fair         Cognition Arousal/Alertness: Awake/alert Behavior During Therapy: WFL for tasks assessed/performed Overall Cognitive Status: Within Functional Limits for tasks assessed      General Comments: Pt is A and O x 4           General Comments General comments (skin integrity, edema, etc.): Author issued pt prevlon boots and repositioned pt to m promote flexibility/ decrease likihood of contractures.      Pertinent Vitals/Pain Pain Assessment: 0-10 Pain Score: 6  Pain Location: "chest down" Pain Descriptors / Indicators: Aching Pain Intervention(s): Limited activity within patient's tolerance;Monitored during session;Repositioned     PT Goals (current goals can now be found in the care plan section) Acute Rehab PT Goals Patient Stated Goal: to return to PLOF Progress towards PT goals: Progressing toward goals    Frequency    Min 2X/week      PT Plan Current plan remains appropriate  AM-PAC PT "6 Clicks" Mobility   Outcome Measure  Help needed turning from your back to your side while in a flat bed without using bedrails?: A Little Help needed moving from lying on your back to sitting on the side of a flat bed without using bedrails?: A Lot Help needed moving to and from a bed to a chair (including a wheelchair)?: A Lot Help needed  standing up from a chair using your arms (e.g., wheelchair or bedside chair)?: Total Help needed to walk in hospital room?: Total Help needed climbing 3-5 steps with a railing? : Total 6 Click Score: 10    End of Session   Activity Tolerance: Patient limited by fatigue Patient left: in bed;with call bell/phone within reach;with bed alarm set Nurse Communication: Mobility status PT Visit Diagnosis: Other abnormalities of gait and mobility (R26.89);Difficulty in walking, not elsewhere classified (R26.2);Muscle weakness (generalized) (M62.81)     Time: 7416-3845 PT Time Calculation (min) (ACUTE ONLY): 24 min  Charges:  $Therapeutic Activity: 23-37 mins                     Jetta Lout PTA 03/29/21, 9:19 AM

## 2021-03-29 NOTE — Progress Notes (Signed)
Nutrition Follow-up  DOCUMENTATION CODES:   Severe malnutrition in context of social or environmental circumstances, Underweight  INTERVENTION:   -Continue 500 mg vitamin C BID -Continue Ensure Enlive po TID, each supplement provides 350 kcal and 20 grams of protein  -Continue ocuvite daily -Magic cup TID with meals, each supplement provides 290 kcal and 9 grams of protein   NUTRITION DIAGNOSIS:   Severe Malnutrition related to social / environmental circumstances as evidenced by energy intake < or equal to 75% for > or equal to 1 month, moderate fat depletion, severe fat depletion, moderate muscle depletion, severe muscle depletion, percent weight loss.  Ongoing  GOAL:   Patient will meet greater than or equal to 90% of their needs  Progressing   MONITOR:   PO intake, Supplement acceptance, Labs, Weight trends, Skin, I & O's  REASON FOR ASSESSMENT:   Consult Assessment of nutrition requirement/status, Poor PO, Wound healing  ASSESSMENT:   45 y.o. Male with PMH of paraplegia of lower extremities, presented after falling with transfer, found after 4 days.  Admitted with Severe Sepsis due to UTI and infected decubitus wounds.  11/26- s/p Sharp excisional debridement of sacral and bilateral buttock and hip stage 4 infected ulcer 11/28- s/p PICC placement  Reviewed I/O's: -487 ml x 24 hours and -4.9 L since admission  UOP: 2.5 L x 24 hours   Pt unavailable at time of visit. Attempted to speak with pt via call to hospital room phone, however, unable to reach.  Pt's intake has improved; noted meal completion 50-90%. Pt is intermittently accepting of Ensure Enlive supplements.   Palliative care following for goals of care; pt is open to SNF placement vs hospice.   Labs reviewed.   Diet Order:   Diet Order             Diet regular Room service appropriate? Yes; Fluid consistency: Thin  Diet effective now                   EDUCATION NEEDS:   Education needs  have been addressed  Skin:  Skin Assessment: Skin Integrity Issues: Skin Integrity Issues:: Other (Comment), Stage IV Stage IV: hip Other: pressure injury to bilateral buttocks  Last BM:  03/29/21 (100 ml via rectal tube)  Height:   Ht Readings from Last 1 Encounters:  03/21/21 5\' 10"  (1.778 m)    Weight:   Wt Readings from Last 1 Encounters:  03/29/21 47.1 kg   BMI:  Body mass index is 14.9 kg/m.  Estimated Nutritional Needs:   Kcal:  1700-1900  Protein:  90-105 grams  Fluid:  > 1.7 L    14/01/22, RD, LDN, CDCES Registered Dietitian II Certified Diabetes Care and Education Specialist Please refer to [facility] for RD and/or RD on-call/weekend/after hours pager

## 2021-03-29 NOTE — TOC Progression Note (Signed)
Transition of Care [facility]) - Progression Note    Patient Details  Name: Jonathon York MRN: [id number] Date of Birth: 16-Jun-1975  Transition of [facility]) CM/SW Contact  Beverly Sessions, RN Phone Number: 03/29/2021, 3:12 PM  Clinical Narrative:      Notified by MD that patient appropriate and interested in [facility] Met with patient at bedside and discussed [facility] vs [facility].  Patient interested in [facility] due to their location.  If they are not able to accept he would be interested in [facility]  Referral made to [facility] with [facility]         Expected Discharge Plan and Services                                                 Social Determinants of Health (SDOH) Interventions    Readmission Risk Interventions No flowsheet data found.

## 2021-03-30 DIAGNOSIS — L89214 Pressure ulcer of right hip, stage 4: Secondary | ICD-10-CM

## 2021-03-30 DIAGNOSIS — R6521 Severe sepsis with septic shock: Secondary | ICD-10-CM | POA: Diagnosis not present

## 2021-03-30 DIAGNOSIS — E43 Unspecified severe protein-calorie malnutrition: Secondary | ICD-10-CM

## 2021-03-30 DIAGNOSIS — A408 Other streptococcal sepsis: Secondary | ICD-10-CM

## 2021-03-30 DIAGNOSIS — A419 Sepsis, unspecified organism: Secondary | ICD-10-CM | POA: Diagnosis not present

## 2021-03-30 LAB — CBC WITH DIFFERENTIAL/PLATELET
Abs Immature Granulocytes: 0.04 10*3/uL (ref 0.00–0.07)
Basophils Absolute: 0 10*3/uL (ref 0.0–0.1)
Basophils Relative: 1 %
Eosinophils Absolute: 0 10*3/uL (ref 0.0–0.5)
Eosinophils Relative: 0 %
HCT: 24.9 % — ABNORMAL LOW (ref 39.0–52.0)
Hemoglobin: 7.7 g/dL — ABNORMAL LOW (ref 13.0–17.0)
Immature Granulocytes: 1 %
Lymphocytes Relative: 20 %
Lymphs Abs: 1.4 10*3/uL (ref 0.7–4.0)
MCH: 24 pg — ABNORMAL LOW (ref 26.0–34.0)
MCHC: 30.9 g/dL (ref 30.0–36.0)
MCV: 77.6 fL — ABNORMAL LOW (ref 80.0–100.0)
Monocytes Absolute: 0.5 10*3/uL (ref 0.1–1.0)
Monocytes Relative: 7 %
Neutro Abs: 5 10*3/uL (ref 1.7–7.7)
Neutrophils Relative %: 71 %
Platelets: 186 10*3/uL (ref 150–400)
RBC: 3.21 MIL/uL — ABNORMAL LOW (ref 4.22–5.81)
RDW: 23.1 % — ABNORMAL HIGH (ref 11.5–15.5)
WBC: 6.9 10*3/uL (ref 4.0–10.5)
nRBC: 0 % (ref 0.0–0.2)

## 2021-03-30 LAB — BASIC METABOLIC PANEL
Anion gap: 4 — ABNORMAL LOW (ref 5–15)
BUN: 7 mg/dL (ref 6–20)
CO2: 27 mmol/L (ref 22–32)
Calcium: 7.8 mg/dL — ABNORMAL LOW (ref 8.9–10.3)
Chloride: 105 mmol/L (ref 98–111)
Creatinine, Ser: <0.3 mg/dL — ABNORMAL LOW (ref 0.61–1.24)
Glucose, Bld: 89 mg/dL (ref 70–99)
Potassium: 2.9 mmol/L — ABNORMAL LOW (ref 3.5–5.1)
Sodium: 136 mmol/L (ref 135–145)

## 2021-03-30 LAB — CULTURE, BLOOD (ROUTINE X 2): Special Requests: ADEQUATE

## 2021-03-30 MED ORDER — POTASSIUM CHLORIDE CRYS ER 20 MEQ PO TBCR: 40.0000 meq | EXTENDED_RELEASE_TABLET | ORAL | Status: DC

## 2021-03-30 MED ORDER — POTASSIUM CHLORIDE CRYS ER 20 MEQ PO TBCR
40.0000 meq | EXTENDED_RELEASE_TABLET | ORAL | Status: AC
  Administered 2021-03-30: 40 meq via ORAL
  Filled 2021-03-30 (×2): qty 2

## 2021-03-30 MED ORDER — VALACYCLOVIR HCL 500 MG PO TABS
500.0000 mg | ORAL_TABLET | Freq: Two times a day (BID) | ORAL | Status: DC
  Administered 2021-03-30 – 2021-04-02 (×6): 500 mg via ORAL
  Filled 2021-03-30 (×7): qty 1

## 2021-03-30 MED ORDER — POTASSIUM CHLORIDE 10 MEQ/100ML IV SOLN
10.0000 meq | INTRAVENOUS | Status: AC
  Administered 2021-03-30 (×3): 10 meq via INTRAVENOUS
  Filled 2021-03-30 (×3): qty 100

## 2021-03-30 NOTE — Progress Notes (Signed)
PT Cancellation Note  Patient Details Name: Jonathon York MRN: [id number] DOB: 04-07-1976   Cancelled Treatment:     PT attempt. Pt requested not getting OOB at this time. Author will attempt to return later this date and continue to follow per current POC.    Rushie Chestnut 03/30/2021, 11:51 AM

## 2021-03-30 NOTE — Progress Notes (Signed)
Physical Therapy Treatment Patient Details Name: Jonathon York MRN: [id number] DOB: April 09, 1976 Today's Date: 03/30/2021   History of Present Illness Jonathon York is a 45yoM who comes to [facility] after a fall out of bed and being on the floor for 4 days. PMH of  pressure ulcers at sacrum, Left ischium, Rt ischium, fall c T12 SCI (aged 45 per pt), paraplegia c WC mobility, neurogenic bladder, foot amputations, anxiety .    PT Comments    Pt was in side lying in bed upon arriving. He needs encouragement but does eventually agree to session. He did not want to get OOB but was willing to sit EOB x ~ 15 minutes. He Ily performed UE exercises and stretching while author performed stretching on BLEs. Overall tolerated session well. He will benefit from rehab at DC to address deficits while progressing him to PLOF.   Recommendations for follow up therapy are one component of a multi-disciplinary discharge planning process, led by the attending physician.  Recommendations may be updated based on patient status, additional functional criteria and insurance authorization.  Follow Up Recommendations  Skilled nursing-short term rehab (<3 hours/day)     Assistance Recommended at Discharge Frequent or constant Supervision/Assistance  Equipment Recommendations  Wheelchair (measurements PT);Wheelchair cushion (measurements PT) (pt's personal w/c in over 68 years old and is not functional. He will greatly benefit from new WC)       Precautions / Restrictions Precautions Precautions: Fall Restrictions Weight Bearing Restrictions: No     Mobility  Bed Mobility Overal bed mobility: Needs Assistance Bed Mobility: Supine to Sit;Sit to Supine Rolling: Min assist Sidelying to sit: Min assist Supine to sit: Min assist Sit to supine: Min assist   General bed mobility comments: pt was able to achieve EOB short sit with min assist + increased time. Vcs for him to use his UEs to proper advancement on BLEs.  min assist to slide hips to EOB. sat EOB x ~ 15 minutes while he perform UE ther ex and author performed LE stretching.    Transfers        General transfer comment: pt unwilling but states" I will get OOB to w/c next session. Author reminded pt that he said that previous date. " I promoise next session to get into w/c to get out of this room."      Balance Overall balance assessment: Needs assistance Sitting-balance support: No upper extremity supported;Feet unsupported Sitting balance-Leahy Scale: Fair       Cognition Arousal/Alertness: Awake/alert Behavior During Therapy: WFL for tasks assessed/performed Overall Cognitive Status: Within Functional Limits for tasks assessed        General Comments: Pt is A and O x 4. Does endorse feeling slightly better but still feels weak               Pertinent Vitals/Pain Pain Assessment: No/denies pain Pain Score: 0-No pain     PT Goals (current goals can now be found in the care plan section) Acute Rehab PT Goals Patient Stated Goal: go to [facility] and get my wound to heal Progress towards PT goals: Progressing toward goals    Frequency    Min 2X/week      PT Plan Current plan remains appropriate       AM-PAC PT "6 Clicks" Mobility   Outcome Measure  Help needed turning from your back to your side while in a flat bed without using bedrails?: A Little Help needed moving from lying on your back to sitting  on the side of a flat bed without using bedrails?: A Lot Help needed moving to and from a bed to a chair (including a wheelchair)?: A Lot Help needed standing up from a chair using your arms (e.g., wheelchair or bedside chair)?: Total Help needed to walk in hospital room?: Total Help needed climbing 3-5 steps with a railing? : Total 6 Click Score: 10    End of Session   Activity Tolerance: Patient tolerated treatment well Patient left: in bed;with call bell/phone within reach;with bed alarm set Nurse  Communication: Mobility status PT Visit Diagnosis: Other abnormalities of gait and mobility (R26.89);Difficulty in walking, not elsewhere classified (R26.2);Muscle weakness (generalized) (M62.81)     Time: 3005-1102 PT Time Calculation (min) (ACUTE ONLY): 31 min  Charges:  $Therapeutic Activity: 8-22 mins                     Jetta Lout PTA 03/30/21, 4:41 PM

## 2021-03-30 NOTE — Progress Notes (Signed)
PROGRESS NOTE  Jonathon York  DOB: [birth year]  PCP: Leonel Ramsay, MD PPI:[id number]  DOA: 03/21/2021  LOS: 9 days  Hospital Day: 10  Chief Complaint  Patient presents with   Fall   Brief narrative: Jonathon York is a 45 y.o. male with PMH significant for paraplegia for last 4 years, chronic sacral decubitus ulcers, neurogenic bladder with suprapubic catheter, osteomyelitis of sacrum who was able to transfer himself in and out of wheelchair at baseline.   Patient fell at home and could not get himself up.  He was found on the ground after about 4 days.  He states he drank his own urine during this period.  In the ED, he was found soiled, dehydrated Met sepsis criteria with chronic sacral ulcers acutely looking worse. He has started on IV antibiotics, IV fluid.   Admitted to ICU service.  Required pressors for septic shock. ID and general surgery consultation was obtained. With clinical improvement, patient transferred out to hospitalist service. See below for details.  Subjective: Patient was seen and examined this morning.  Propped up in bed.  Not in distress.  Pending LTAC assessment Labs this morning with potassium low at 2.9.  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 -Also seen orthopedic surgery in 11/30.  Patient has exposed bones.  He may be better served at a tertiary care center.  However he is afraid of losing his legs.  He wants to try to go to [facility] for wound care with wound VAC before considering further surgical intervention. -Remains on IV Zosyn.  ID following. -Off Levophed since 11/27. Continue IV fluid. -Currently has a fecal bag to prevent contamination of the wound  Acute on chronic blood loss anemia -Slowly wheezing from raw wound at the decubitus ulcer.  Ferritin low at 39.  Received 3 units of PRBC so far.  Hemoglobin trending down, 7.7 today. Recent Labs    08/31/20 0602 09/01/20 0508 03/27/21 0611 03/28/21 0537 03/28/21 2120 03/29/21 0540 03/30/21 0500  HGB 10.0*   < > 7.7* 6.7* 8.4* 8.5* 7.7*  MCV 72.5*   < > 73.9* 73.8*  --  77.1* 77.6*  FERRITIN 39  --   --   --   --   --   --   TIBC 211*  --   --   --   --   --   --   IRON 21*  --   --   --   --   --   --    < > = values in this interval not displayed.   Thrombocytopenia -Improved to normal Recent Labs  Lab 03/24/21 0536 03/25/21 0105 03/26/21 0601 03/27/21 0611 03/28/21 0537 03/29/21 0540 03/30/21 0500  PLT 78* 83* 85* 92* 117* 131* 186   Severe protein calorie malnutrition Refeeding syndrome -Dietitian consult appreciated.  Goals of care -Poor prognosis because of significant progressive sacral.  Palliative care consult appreciated   Mobility: Wheelchair-bound Living condition: Lives at home alone Goals of care:   Code Status: Full Code  Nutritional status: Body mass index is 14.9 kg/m. Nutrition Problem: Severe Malnutrition Etiology: social / environmental circumstances Signs/Symptoms: energy intake < or equal to 75% for > or equal to 1 month, moderate fat depletion, severe fat depletion, moderate muscle depletion, severe muscle depletion, percent weight loss Percent weight loss: 15.8 % Diet:  Diet Order             Diet regular Room service appropriate? Yes; Fluid consistency: Thin  Diet effective now                  DVT prophylaxis:  heparin injection 5,000 Units Start: 03/29/21 2200 SCDs Start: 03/21/21 2240   Antimicrobials: IV Zosyn Fluid: Not on IV fluid Consultants: ID, surgery, orthopedics Family Communication: None at bedside  Status is: Inpatient  Remains inpatient appropriate because: Needs further management of the ulcer wounds  Dispo: The patient is from: Home              Anticipated d/c is to: [facility] care center versus LTAC.               Patient currently is not medically stable to d/c.   Difficult to place patient No     Infusions:   sodium chloride Stopped (03/23/21 2251)   piperacillin-tazobactam (ZOSYN)  IV 3.375 g (03/30/21 0526)   potassium chloride      Scheduled Meds:  vitamin C  500 mg Oral BID   Chlorhexidine Gluconate Cloth  6 each Topical Daily   collagenase   Topical BID   feeding supplement  237 mL Oral TID BM   heparin injection (subcutaneous)  5,000 Units Subcutaneous Q12H   multivitamin-lutein  1 capsule Oral Daily   potassium chloride  40 mEq Oral Q2H   sodium chloride flush  10-40 mL Intracatheter Q12H    PRN meds: acetaminophen, HYDROcodone-acetaminophen, HYDROmorphone, magic mouthwash w/lidocaine, polyethylene glycol, traZODone   Antimicrobials: Anti-infectives (From admission, onward)    Start     Dose/Rate Route Frequency Ordered Stop   03/27/21 1508  piperacillin-tazobactam (ZOSYN) 3.375 GM/50ML IVPB       Note to Pharmacy: Joycelyn Das K: cabinet override      03/27/21 1508 03/28/21 0314   03/27/21 0600  piperacillin-tazobactam (ZOSYN) IVPB 3.375 g        3.375 g 12.5 mL/hr over 240 Minutes Intravenous Every 8 hours 03/26/21 2329     03/27/21 0517  piperacillin-tazobactam (ZOSYN) 3.375 GM/50ML IVPB       Note to Pharmacy: Kermit Balo E: cabinet override      03/27/21 0517 03/27/21 1729   03/25/21 1000  cefTRIAXone (ROCEPHIN) 2 g in sodium chloride 0.9 % 100 mL IVPB  Status:  Discontinued        2 g 200 mL/hr over 30 Minutes Intravenous Every 24 hours 03/24/21 1510 03/26/21 2329   03/24/21 2000  vancomycin (VANCOCIN) IVPB 1000 mg/200 mL premix  Status:  Discontinued        1,000 mg 200 mL/hr over 60 Minutes Intravenous Every 24 hours 03/24/21 0858 03/24/21 1026   03/24/21 2000  vancomycin (VANCOREADY) IVPB 1000 mg/200 mL  Status:  Discontinued        1,000 mg 200 mL/hr over 60 Minutes Intravenous Every 24 hours 03/24/21 1026 03/25/21 0902   03/22/21 2000  vancomycin  (VANCOREADY) IVPB 1250 mg/250 mL  Status:  Discontinued        1,250 mg 166.7 mL/hr over 90 Minutes Intravenous Every 24 hours 03/22/21 1159 03/24/21 0858   03/22/21 0600  piperacillin-tazobactam (ZOSYN) IVPB 3.375 g        3.375 g 12.5 mL/hr over 240 Minutes Intravenous Every 8 hours 03/21/21 2216 03/24/21 2359   03/21/21 2217  vancomycin variable dose per unstable renal function (pharmacist dosing)  Status:  Discontinued  Does not apply See admin instructions 03/21/21 2217 03/24/21 0859   03/21/21 1900  ceFEPIme (MAXIPIME) 2 g in sodium chloride 0.9 % 100 mL IVPB        2 g 200 mL/hr over 30 Minutes Intravenous  Once 03/21/21 1851 03/21/21 2122   03/21/21 1900  metroNIDAZOLE (FLAGYL) IVPB 500 mg        500 mg 100 mL/hr over 60 Minutes Intravenous  Once 03/21/21 1851 03/21/21 2027   03/21/21 1900  vancomycin (VANCOCIN) IVPB 1000 mg/200 mL premix        1,000 mg 200 mL/hr over 60 Minutes Intravenous  Once 03/21/21 1851 03/21/21 2151       Objective: Vitals:   03/30/21 0536 03/30/21 0735  BP: 100/67 107/70  Pulse: (!) 102 100  Resp: 18 18  Temp: 98.6 F (37 C) 98.6 F (37 C)  SpO2: 98% 98%    Intake/Output Summary (Last 24 hours) at 03/30/2021 1344 Last data filed at 03/30/2021 2248 Gross per 24 hour  Intake 240 ml  Output 1600 ml  Net -1360 ml   Filed Weights   03/24/21 0500 03/25/21 0500 03/29/21 0500  Weight: 42.6 kg 42 kg 47.1 kg   Weight change:  Body mass index is 14.9 kg/m.   Physical Exam: General exam: Middle-aged Caucasian male.  Thin built Skin: No rashes, lesions or ulcers. HEENT: Atraumatic, normocephalic, no obvious bleeding Lungs: Clear to auscultation bilaterally CVS: Regular rate and rhythm, no murmur GI/Abd soft, nontender, nondistended, bowel sound present Back: Decubitus ulcers bandaged CNS: Alert, awake, oriented x3, paraplegia at baseline Psychiatry: Sad affect Extremities: No pedal edema, no calf tenderness  Data Review: I have  personally reviewed the laboratory data and studies available.  F/u labs ordered Unresulted Labs (From admission, onward)     Start     Ordered   03/29/21 0500  CBC with Differential/Platelet  Daily,   R     Question:  Specimen collection method  Answer:  Unit=Unit collect   03/28/21 1425   03/29/21 2500  Basic metabolic panel  Daily,   R     Question:  Specimen collection method  Answer:  Unit=Unit collect   03/28/21 1425            Signed, Terrilee Croak, MD [facility] 03/30/2021

## 2021-03-30 NOTE — Progress Notes (Addendum)
   Date of Admission:  03/21/2021      ID: Jonathon York is a 45 y.o. male  Principal Problem:   Sepsis (HCC) Active Problems:   Acute on chronic anemia   AKI (acute kidney injury) (HCC)    Subjective: Pt wants to know whether he can have a wound vac on the wounds  He is working with PT Not able to eat properly because of oral ulcers   Medications:   vitamin C  500 mg Oral BID   Chlorhexidine Gluconate Cloth  6 each Topical Daily   collagenase   Topical BID   feeding supplement  237 mL Oral TID BM   heparin injection (subcutaneous)  5,000 Units Subcutaneous Q12H   multivitamin-lutein  1 capsule Oral Daily   sodium chloride flush  10-40 mL Intracatheter Q12H   valACYclovir  500 mg Oral BID    Objective: Vital signs in last 24 hours: Temp:  [98.3 F (36.8 C)-98.6 F (37 C)] 98.6 F (37 C) (12/02 0735) Pulse Rate:  [100-108] 100 (12/02 0735) Resp:  [18-20] 18 (12/02 0735) BP: (100-107)/(66-70) 107/70 (12/02 0735) SpO2:  [98 %-100 %] 98 % (12/02 0735)  PHYSICAL EXAM:  General: awake and alert, emaciated, pale, chronically ill Tongu ulceration Pin point Ulcer over lips and rt nostril  Neck: , symmetrical, no adenopathy, thyroid: non tender no carotid bruit and no JVD. Sacral and hip wounds  Rt hip- bone/hip joint sticking  thru the wound- extensive loss of tissue      Left hip wound- tunneling . Has some ischemic skin margin Sacral wound clean Lungs: b/l air entry Heart: Regular rate and rhythm, no murmur, rub or gallop. Abdomen: Soft, non-tender,not distended. Bowel sounds normal. No masses Extremities:rt PICC wasting of lower extremities Skin: No rashes or lesions. Or bruising Lymph: Cervical, supraclavicular normal. Neurologic: paraplegia  Lab Results Recent Labs    03/29/21 0540 03/30/21 0500  WBC 8.8 6.9  HGB 8.5* 7.7*  HCT 26.6* 24.9*  NA 135 136  K 3.6 2.9*  CL 105 105  CO2 26 27  BUN 10 7  CREATININE <0.30* <0.30*   Liver  Panel Recent Labs    03/28/21 0537  PROT 5.3*  ALBUMIN 2.2*  AST 20  ALT 17  ALKPHOS 45  BILITOT 0.4   Microbiology: 03/21/21 Urine culture morganella BC strep anginosis     Assessment/Plan:  Fall - stayed on the floor for 4 days  Stage IV wounds both hips and sacrum Rt hip joint out of the wound-osteo /septic arthritis  is bound to be present-  this is never going to heal- he may need girdlestone and flap /graft which may also not be possible due to poor nutritional status-  needs higher level of care Consider wound vac- will ask wound care to assess --continue zosyn- Antibiotic alone is not going to cut it but as he was septic on admission with bacteremia will need prolonged course of IV- pseudomonas in culture so far. On zosyn .   Strep anginosus bacteremia- on zosyn  Severe anemia  Thrombocytopenia /anemia- check b12, folate and iron   Oral ulcers and lip ulcers- r/o HSV- will send HSV DNA  and start valtrex  Severe malnutrition/muscle wasting Hypoalbuminemia Nutritionist seen patient  Discussed the management with patient and hospitalist. Pt needs transfer to higher level of care.

## 2021-03-30 NOTE — TOC Progression Note (Addendum)
Transition of Care [facility]) - Progression Note    Patient Details  Name: Jonathon York MRN: [id number] Date of Birth: 1975-06-14  Transition of Care [facility]) CM/SW Contact  Liliana Cline, LCSW Phone Number: 03/30/2021, 9:16 AM  Clinical Narrative:   Reached out to Candise Bowens at [facility] who stated she is having their Wound Care RN review patient's information to see if they can meet his needs. Candise Bowens asked about the goal for patient and wanted to make sure the team is aware they do not have Plastics there. Notified MD.  11:40Candise Bowens with [facility] is starting insurance auth for patient to go to [facility].       Expected Discharge Plan and Services                                                 Social Determinants of Health (SDOH) Interventions    Readmission Risk Interventions No flowsheet data found.

## 2021-03-31 DIAGNOSIS — R6521 Severe sepsis with septic shock: Secondary | ICD-10-CM | POA: Diagnosis not present

## 2021-03-31 DIAGNOSIS — A419 Sepsis, unspecified organism: Secondary | ICD-10-CM | POA: Diagnosis not present

## 2021-03-31 LAB — CBC WITH DIFFERENTIAL/PLATELET
Abs Immature Granulocytes: 0.02 10*3/uL (ref 0.00–0.07)
Basophils Absolute: 0 10*3/uL (ref 0.0–0.1)
Basophils Relative: 0 %
Eosinophils Absolute: 0 10*3/uL (ref 0.0–0.5)
Eosinophils Relative: 1 %
HCT: 23.8 % — ABNORMAL LOW (ref 39.0–52.0)
Hemoglobin: 7.5 g/dL — ABNORMAL LOW (ref 13.0–17.0)
Immature Granulocytes: 0 %
Lymphocytes Relative: 20 %
Lymphs Abs: 1.3 10*3/uL (ref 0.7–4.0)
MCH: 24.8 pg — ABNORMAL LOW (ref 26.0–34.0)
MCHC: 31.5 g/dL (ref 30.0–36.0)
MCV: 78.8 fL — ABNORMAL LOW (ref 80.0–100.0)
Monocytes Absolute: 0.5 10*3/uL (ref 0.1–1.0)
Monocytes Relative: 7 %
Neutro Abs: 5 10*3/uL (ref 1.7–7.7)
Neutrophils Relative %: 72 %
Platelets: 246 10*3/uL (ref 150–400)
RBC: 3.02 MIL/uL — ABNORMAL LOW (ref 4.22–5.81)
RDW: 23 % — ABNORMAL HIGH (ref 11.5–15.5)
Smear Review: NORMAL
WBC: 6.9 10*3/uL (ref 4.0–10.5)
nRBC: 0 % (ref 0.0–0.2)

## 2021-03-31 LAB — IRON AND TIBC
Iron: 15 ug/dL — ABNORMAL LOW (ref 45–182)
Saturation Ratios: 12 % — ABNORMAL LOW (ref 17.9–39.5)
TIBC: 127 ug/dL — ABNORMAL LOW (ref 250–450)
UIBC: 112 ug/dL

## 2021-03-31 LAB — FOLATE: Folate: 9.7 ng/mL (ref >5.9)

## 2021-03-31 LAB — VITAMIN B12: Vitamin B-12: 262 pg/mL (ref 180–914)

## 2021-03-31 LAB — BASIC METABOLIC PANEL
Anion gap: 7 (ref 5–15)
BUN: 7 mg/dL (ref 6–20)
CO2: 26 mmol/L (ref 22–32)
Calcium: 7.9 mg/dL — ABNORMAL LOW (ref 8.9–10.3)
Chloride: 102 mmol/L (ref 98–111)
Creatinine, Ser: <0.3 mg/dL — ABNORMAL LOW (ref 0.61–1.24)
Glucose, Bld: 93 mg/dL (ref 70–99)
Potassium: 3.5 mmol/L (ref 3.5–5.1)
Sodium: 135 mmol/L (ref 135–145)

## 2021-03-31 LAB — CORTISOL-AM, BLOOD: Cortisol - AM: 19.8 ug/dL (ref 6.7–22.6)

## 2021-03-31 LAB — FERRITIN: Ferritin: 145 ng/mL (ref 24–336)

## 2021-03-31 NOTE — Progress Notes (Signed)
PROGRESS NOTE  Jonathon York  DOB: [birth year]  PCP: Leonel Ramsay, MD IHK:[id number]  DOA: 03/21/2021  LOS: 10 days  Hospital Day: 38  Chief Complaint  Patient presents with   Fall   Brief narrative: Jonathon York is a 45 y.o. male with PMH significant for paraplegia for last 4 years, chronic sacral decubitus ulcers, neurogenic bladder with suprapubic catheter, osteomyelitis of sacrum who was able to transfer himself in and out of wheelchair at baseline.   Patient fell at home and could not get himself up.  He was found on the ground after about 4 days.  He states he drank his own urine during this period.  In the ED, he was found soiled, dehydrated Met sepsis criteria with chronic sacral ulcers acutely looking worse. He has started on IV antibiotics, IV fluid.   Admitted to ICU service.  Required pressors for septic shock. ID and general surgery consultation was obtained. With clinical improvement, patient transferred out to hospitalist service. See below for details.  Subjective: Patient was seen and examined this morning.  Propped up in bed.  Not in distress.  No new symptoms.  Pending LTAC placement.  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 -Also seen orthopedic surgery in 11/30.  Patient has exposed bones.  He may be better served at a tertiary care center.  However he is afraid of losing his legs.  He wants to try to go to [facility] for wound care with wound VAC before considering further surgical intervention. -Remains on IV Zosyn.  ID following. -Off Levophed since 11/27. Continue IV fluid. -Currently has a fecal bag to prevent contamination of the wound  Acute on chronic blood loss anemia -Slowly oozing blood from raw wound at the decubitus ulcer.  Ferritin low at 39. Received 3 units of PRBC so  far.  Hemoglobin trending down, 7.5 today. Recent Labs    08/31/20 0602 09/01/20 0508 03/28/21 0537 03/28/21 2120 03/29/21 0540 03/30/21 0500 03/31/21 0730 03/31/21 0815  HGB 10.0*   < > 6.7* 8.4* 8.5* 7.7* 7.5*  --   MCV 72.5*   < > 73.8*  --  77.1* 77.6* 78.8*  --   VITAMINB12  --   --   --   --   --   --   --  262  FOLATE  --   --   --   --   --   --  9.7  --   FERRITIN 39  --   --   --   --   --  145  --   TIBC 211*  --   --   --   --   --  127*  --   IRON 21*  --   --   --   --   --  15*  --    < > = values in this interval not displayed.   Thrombocytopenia -Improved to normal Recent Labs  Lab 03/25/21 0105 03/26/21 0601 03/27/21 0611 03/28/21 0537 03/29/21 0540 03/30/21 0500 03/31/21 0730  PLT 83* 85* 92* 117* 131* 186 246   Severe protein calorie malnutrition Refeeding syndrome -Dietitian consult appreciated.  Goals of care -Poor prognosis because of significant sacral decubitus ulcer with exposed bone.  Limited treatment options.  Refused nutritional status improves, he may be a candidate for plastic surgery at tertiary  care center.  For now he is being pursued for LTAC placement.  Mobility: Wheelchair-bound Living condition: Lives at home alone Goals of care:   Code Status: Full Code  Nutritional status: Body mass index is 15.14 kg/m. Nutrition Problem: Severe Malnutrition Etiology: social / environmental circumstances Signs/Symptoms: energy intake < or equal to 75% for > or equal to 1 month, moderate fat depletion, severe fat depletion, moderate muscle depletion, severe muscle depletion, percent weight loss Percent weight loss: 15.8 % Diet:  Diet Order             Diet regular Room service appropriate? Yes; Fluid consistency: Thin  Diet effective now                  DVT prophylaxis:  heparin injection 5,000 Units Start: 03/29/21 2200 SCDs Start: 03/21/21 2240   Antimicrobials: IV Zosyn Fluid: Not on IV fluid Consultants: ID, surgery,  orthopedics Family Communication: None at bedside  Status is: Inpatient  Remains inpatient appropriate because: Needs further management of the ulcer wounds  Dispo: The patient is from: Home              Anticipated d/c is to: [facility] care center versus LTAC.              Patient currently is not medically stable to d/c.   Difficult to place patient No     Infusions:   sodium chloride 250 mL (03/30/21 2214)   piperacillin-tazobactam (ZOSYN)  IV 3.375 g (03/31/21 0655)    Scheduled Meds:  vitamin C  500 mg Oral BID   Chlorhexidine Gluconate Cloth  6 each Topical Daily   collagenase   Topical BID   feeding supplement  237 mL Oral TID BM   heparin injection (subcutaneous)  5,000 Units Subcutaneous Q12H   multivitamin-lutein  1 capsule Oral Daily   sodium chloride flush  10-40 mL Intracatheter Q12H   valACYclovir  500 mg Oral BID    PRN meds: acetaminophen, HYDROcodone-acetaminophen, HYDROmorphone, magic mouthwash w/lidocaine, polyethylene glycol, traZODone   Antimicrobials: Anti-infectives (From admission, onward)    Start     Dose/Rate Route Frequency Ordered Stop   03/30/21 2200  valACYclovir (VALTREX) tablet 500 mg        500 mg Oral 2 times daily 03/30/21 1547     03/27/21 1508  piperacillin-tazobactam (ZOSYN) 3.375 GM/50ML IVPB       Note to Pharmacy: Joycelyn Das K: cabinet override      03/27/21 1508 03/28/21 0314   03/27/21 0600  piperacillin-tazobactam (ZOSYN) IVPB 3.375 g        3.375 g 12.5 mL/hr over 240 Minutes Intravenous Every 8 hours 03/26/21 2329     03/27/21 0517  piperacillin-tazobactam (ZOSYN) 3.375 GM/50ML IVPB       Note to Pharmacy: Kermit Balo E: cabinet override      03/27/21 0517 03/27/21 1729   03/25/21 1000  cefTRIAXone (ROCEPHIN) 2 g in sodium chloride 0.9 % 100 mL IVPB  Status:  Discontinued        2 g 200 mL/hr over 30 Minutes Intravenous Every 24 hours 03/24/21 1510 03/26/21 2329   03/24/21 2000  vancomycin (VANCOCIN) IVPB 1000  mg/200 mL premix  Status:  Discontinued        1,000 mg 200 mL/hr over 60 Minutes Intravenous Every 24 hours 03/24/21 0858 03/24/21 1026   03/24/21 2000  vancomycin (VANCOREADY) IVPB 1000 mg/200 mL  Status:  Discontinued        1,000 mg  200 mL/hr over 60 Minutes Intravenous Every 24 hours 03/24/21 1026 03/25/21 0902   03/22/21 2000  vancomycin (VANCOREADY) IVPB 1250 mg/250 mL  Status:  Discontinued        1,250 mg 166.7 mL/hr over 90 Minutes Intravenous Every 24 hours 03/22/21 1159 03/24/21 0858   03/22/21 0600  piperacillin-tazobactam (ZOSYN) IVPB 3.375 g        3.375 g 12.5 mL/hr over 240 Minutes Intravenous Every 8 hours 03/21/21 2216 03/24/21 2359   03/21/21 2217  vancomycin variable dose per unstable renal function (pharmacist dosing)  Status:  Discontinued         Does not apply See admin instructions 03/21/21 2217 03/24/21 0859   03/21/21 1900  ceFEPIme (MAXIPIME) 2 g in sodium chloride 0.9 % 100 mL IVPB        2 g 200 mL/hr over 30 Minutes Intravenous  Once 03/21/21 1851 03/21/21 2122   03/21/21 1900  metroNIDAZOLE (FLAGYL) IVPB 500 mg        500 mg 100 mL/hr over 60 Minutes Intravenous  Once 03/21/21 1851 03/21/21 2027   03/21/21 1900  vancomycin (VANCOCIN) IVPB 1000 mg/200 mL premix        1,000 mg 200 mL/hr over 60 Minutes Intravenous  Once 03/21/21 1851 03/21/21 2151       Objective: Vitals:   03/31/21 0528 03/31/21 0758  BP: 101/67 102/65  Pulse: (!) 101 (!) 109  Resp: 16 20  Temp: 99.1 F (37.3 C) 99 F (37.2 C)  SpO2: 100% 99%    Intake/Output Summary (Last 24 hours) at 03/31/2021 1301 Last data filed at 03/31/2021 0655 Gross per 24 hour  Intake 340 ml  Output 950 ml  Net -610 ml   Filed Weights   03/25/21 0500 03/29/21 0500 03/31/21 0500  Weight: 42 kg 47.1 kg 47.9 kg   Weight change:  Body mass index is 15.14 kg/m.   Physical Exam: General exam: Middle-aged Caucasian male.  Thin built.   Skin: No rashes, lesions or ulcers. HEENT: Atraumatic,  normocephalic, no obvious bleeding Lungs: Clear to auscultation bilaterally CVS: Regular rate and rhythm, no murmur GI/Abd soft, nontender, nondistended, bowel sound present Back: Decubitus ulcers at the sacrum and hip area bandaged CNS: Alert, awake, oriented x3, paraplegia at baseline Psychiatry: Sad affect Extremities: No pedal edema, no calf tenderness  Data Review: I have personally reviewed the laboratory data and studies available.  F/u labs ordered Unresulted Labs (From admission, onward)     Start     Ordered   04/01/21 0500  CBC with Differential/Platelet  Daily,   R     Question:  Specimen collection method  Answer:  Unit=Unit collect   03/31/21 0807   04/01/21 6433  Basic metabolic panel  Daily,   R     Question:  Specimen collection method  Answer:  Unit=Unit collect   03/31/21 0807   03/30/21 1546  Herpes simplex virus (HSV), DNA by PCR Sterile Swab  Once,   R       Question:  NICU - indicate swab sites:  Answer:  Oropharynx   03/30/21 1546            Signed, Terrilee Croak, MD [facility] 03/31/2021

## 2021-04-01 DIAGNOSIS — A419 Sepsis, unspecified organism: Secondary | ICD-10-CM | POA: Diagnosis not present

## 2021-04-01 DIAGNOSIS — R6521 Severe sepsis with septic shock: Secondary | ICD-10-CM | POA: Diagnosis not present

## 2021-04-01 LAB — CBC WITH DIFFERENTIAL/PLATELET
Abs Immature Granulocytes: 0.02 10*3/uL (ref 0.00–0.07)
Basophils Absolute: 0 10*3/uL (ref 0.0–0.1)
Basophils Relative: 1 %
Eosinophils Absolute: 0 10*3/uL (ref 0.0–0.5)
Eosinophils Relative: 0 %
HCT: 23.1 % — ABNORMAL LOW (ref 39.0–52.0)
Hemoglobin: 7.3 g/dL — ABNORMAL LOW (ref 13.0–17.0)
Immature Granulocytes: 0 %
Lymphocytes Relative: 16 %
Lymphs Abs: 1.1 10*3/uL (ref 0.7–4.0)
MCH: 24.8 pg — ABNORMAL LOW (ref 26.0–34.0)
MCHC: 31.6 g/dL (ref 30.0–36.0)
MCV: 78.6 fL — ABNORMAL LOW (ref 80.0–100.0)
Monocytes Absolute: 0.4 10*3/uL (ref 0.1–1.0)
Monocytes Relative: 6 %
Neutro Abs: 5.5 10*3/uL (ref 1.7–7.7)
Neutrophils Relative %: 77 %
Platelets: 244 10*3/uL (ref 150–400)
RBC: 2.94 MIL/uL — ABNORMAL LOW (ref 4.22–5.81)
RDW: 22.8 % — ABNORMAL HIGH (ref 11.5–15.5)
Smear Review: NORMAL
WBC: 7.1 10*3/uL (ref 4.0–10.5)
nRBC: 0 % (ref 0.0–0.2)

## 2021-04-01 LAB — BASIC METABOLIC PANEL
Anion gap: 5 (ref 5–15)
BUN: 7 mg/dL (ref 6–20)
CO2: 27 mmol/L (ref 22–32)
Calcium: 7.9 mg/dL — ABNORMAL LOW (ref 8.9–10.3)
Chloride: 100 mmol/L (ref 98–111)
Creatinine, Ser: <0.3 mg/dL — ABNORMAL LOW (ref 0.61–1.24)
Glucose, Bld: 93 mg/dL (ref 70–99)
Potassium: 3.3 mmol/L — ABNORMAL LOW (ref 3.5–5.1)
Sodium: 132 mmol/L — ABNORMAL LOW (ref 135–145)

## 2021-04-01 LAB — CULTURE, BLOOD (ROUTINE X 2)
Culture: NO GROWTH
Culture: NO GROWTH
Special Requests: ADEQUATE
Special Requests: ADEQUATE

## 2021-04-01 LAB — AEROBIC CULTURE W GRAM STAIN (SUPERFICIAL SPECIMEN)

## 2021-04-01 LAB — GLUCOSE, CAPILLARY: Glucose-Capillary: 92 mg/dL (ref 70–99)

## 2021-04-01 MED ORDER — SODIUM CHLORIDE 0.9 % IV SOLN: 1.0000 g | Freq: Three times a day (TID) | INTRAVENOUS | Status: DC

## 2021-04-01 MED ORDER — SODIUM CHLORIDE 0.9 % IV SOLN
1.0000 g | Freq: Three times a day (TID) | INTRAVENOUS | Status: DC
  Administered 2021-04-01 – 2021-04-02 (×3): 1 g via INTRAVENOUS
  Filled 2021-04-01 (×6): qty 1

## 2021-04-01 MED ORDER — MEROPENEM 1 G IV SOLR
1.0000 g | Freq: Three times a day (TID) | INTRAVENOUS | Status: DC
Start: 2021-04-01 — End: 2021-04-01
  Filled 2021-04-01 (×3): qty 1

## 2021-04-01 MED ORDER — POTASSIUM CHLORIDE 10 MEQ/100ML IV SOLN
10.0000 meq | INTRAVENOUS | Status: AC
  Administered 2021-04-01 (×3): 10 meq via INTRAVENOUS
  Filled 2021-04-01 (×3): qty 100

## 2021-04-01 MED ORDER — POTASSIUM CHLORIDE CRYS ER 20 MEQ PO TBCR
40.0000 meq | EXTENDED_RELEASE_TABLET | Freq: Once | ORAL | Status: DC
  Filled 2021-04-01: qty 2

## 2021-04-01 NOTE — Progress Notes (Signed)
   Date of Admission:  03/21/2021      ID: Jonathon York is a 45 y.o. male  Principal Problem:   Sepsis (HCC) Active Problems:   Acute on chronic anemia   AKI (acute kidney injury) (HCC)    Subjective: Pt says the ulcers in his mouth/tongue is improving Still taking only 1 carton on ensure- he has to take 3   Medications:   vitamin C  500 mg Oral BID   Chlorhexidine Gluconate Cloth  6 each Topical Daily   collagenase   Topical BID   feeding supplement  237 mL Oral TID BM   heparin injection (subcutaneous)  5,000 Units Subcutaneous Q12H   multivitamin-lutein  1 capsule Oral Daily   sodium chloride flush  10-40 mL Intracatheter Q12H   valACYclovir  500 mg Oral BID    Objective: Vital signs in last 24 hours: Temp:  [98.5 F (36.9 C)-99.5 F (37.5 C)] 98.5 F (36.9 C) (12/04 0917) Pulse Rate:  [109-115] 109 (12/04 0917) Resp:  [17-20] 17 (12/04 0917) BP: (98-109)/(60-72) 109/65 (12/04 0917) SpO2:  [99 %-100 %] 99 % (12/04 0917)  PHYSICAL EXAM:  General: awake and alert, emaciated, pale, chronically ill Tongu ulceration Pin point Ulcer over lips and rt nostril  Neck: , symmetrical, no adenopathy, thyroid: non tender no carotid bruit and no JVD. Sacral and hip wounds  Rt hip- bone/hip joint sticking  thru the wound- extensive loss of tissue      Left hip wound- tunneling . Has some ischemic skin margin Sacral wound clean Lungs: b/l air entry Heart: Regular rate and rhythm, no murmur, rub or gallop. Abdomen: Soft, non-tender,not distended. Bowel sounds normal. No masses Extremities:rt PICC wasting of lower extremities Skin: No rashes or lesions. Or bruising Lymph: Cervical, supraclavicular normal. Neurologic: paraplegia  Lab Results Recent Labs    03/31/21 0730 04/01/21 0515  WBC 6.9 7.1  HGB 7.5* 7.3*  HCT 23.8* 23.1*  NA 135 132*  K 3.5 3.3*  CL 102 100  CO2 26 27  BUN 7 7  CREATININE <0.30* <0.30*   Liver Panel No results for input(s):  PROT, ALBUMIN, AST, ALT, ALKPHOS, BILITOT, BILIDIR, IBILI in the last 72 hours.  Microbiology: 03/21/21 Urine culture morganella BC strep anginosis   Wound culture- pseudomonas-    Assessment/Plan:  Fall - stayed on the floor for 4 days  Stage IV wounds both hips and sacrum Rt hip joint out of the wound-osteo /septic arthritis  is bound to be present-  this is never going to heal- he may need girdlestone and flap /graft which may also not be possible currently due to poor nutritional status-  needs higher level of care Consider wound vac- will ask wound care to assess --Pseudomonas resistant to zosyn- so will change to meropenem. Antibiotic alone is not going to cut it but as he was septic on admission with bacteremia will need prolonged course of IV-   Strep anginosus bacteremia- on zosyn/ now will be changed to meropenem  Severe anemia  Thrombocytopenia  resolved  Anemia- malnutrition and chronic wound, with some oozing/blood loss  Oral ulcers and lip ulcers- r/o HSV-HSV DNA sent   and started valtrex on 12/2- doing better  Severe malnutrition/muscle wasting Hypoalbuminemia Nutritionist seen patient  Discussed the management with patient and hospitalist.  HE is awaiting LTAC  RCID covering tomorrow- call if needed

## 2021-04-01 NOTE — Progress Notes (Addendum)
PROGRESS NOTE  Jonathon York  DOB: 06/26/1975  PCP: Leonel Ramsay, MD AGT:[id number]  DOA: 03/21/2021  LOS: 11 days  Hospital Day: 12  Chief Complaint  Patient presents with   Fall   Brief narrative: Jonathon York is a 45 y.o. male with PMH significant for paraplegia for last 4 years, chronic sacral decubitus ulcers, neurogenic bladder with suprapubic catheter, osteomyelitis of sacrum who was able to transfer himself in and out of wheelchair at baseline.   Patient fell at home and could not get himself up.  He was found on the ground after about 4 days.  He states he drank his own urine during this period.  In the ED, he was found soiled, dehydrated Met sepsis criteria with chronic sacral ulcers acutely looking worse. He has started on IV antibiotics, IV fluid.   Admitted to ICU service.  Required pressors for septic shock. ID and general surgery consultation was obtained. With clinical improvement, patient transferred out to hospitalist service. See below for details.  Subjective: Patient was seen and examined this morning.  Propped up in bed.  Not in distress.  Continues to have mild tachycardia.  Getting local wound care.  Pending LTAC placement.  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 -Also seen orthopedic surgery in 11/30.  Patient has exposed bones.  He may be better served at a tertiary care center.  However he is afraid of losing his legs.  He wants to try to go to [facility] for wound care with wound VAC before considering further surgical intervention. -Remains on IV Zosyn.  ID following. -Off Levophed since 11/27. Continue IV fluid. -Currently has a fecal bag to prevent contamination of the wound  Acute on chronic blood loss anemia -Slowly oozing blood from raw wound at the decubitus ulcer.   Ferritin low at 39. Received 3 units of PRBC so far.  Hemoglobin trending down, 7.5 today. Recent Labs    08/31/20 0602 09/01/20 0508 03/28/21 2120 03/29/21 0540 03/30/21 0500 03/31/21 0730 03/31/21 0815 04/01/21 0515  HGB 10.0*   < > 8.4* 8.5* 7.7* 7.5*  --  7.3*  MCV 72.5*   < >  --  77.1* 77.6* 78.8*  --  78.6*  VITAMINB12  --   --   --   --   --   --  262  --   FOLATE  --   --   --   --   --  9.7  --   --   FERRITIN 39  --   --   --   --  145  --   --   TIBC 211*  --   --   --   --  127*  --   --   IRON 21*  --   --   --   --  15*  --   --    < > = values in this interval not displayed.    Thrombocytopenia -Improved to normal Recent Labs  Lab 03/26/21 0601 03/27/21 0611 03/28/21 0537 03/29/21 0540 03/30/21 0500 03/31/21 0730 04/01/21 0515  PLT 85* 92* 117* 131* 186 246 244    Severe protein calorie malnutrition Refeeding syndrome -Dietitian consult appreciated.  Goals of care -Poor prognosis because of significant sacral decubitus ulcer with exposed bone.  Limited treatment options.  Refused nutritional status improves, he  may be a candidate for plastic surgery at tertiary care center.  For now he is being pursued for LTAC placement.  Mobility: Wheelchair-bound Living condition: Lives at home alone Goals of care:   Code Status: Full Code  Nutritional status: Body mass index is 15.14 kg/m. Nutrition Problem: Severe Malnutrition Etiology: social / environmental circumstances Signs/Symptoms: energy intake < or equal to 75% for > or equal to 1 month, moderate fat depletion, severe fat depletion, moderate muscle depletion, severe muscle depletion, percent weight loss Percent weight loss: 15.8 % Diet:  Diet Order             Diet regular Room service appropriate? Yes; Fluid consistency: Thin  Diet effective now                  DVT prophylaxis:  heparin injection 5,000 Units Start: 03/29/21 2200 SCDs Start: 03/21/21 2240   Antimicrobials: IV  Zosyn Fluid: Not on IV fluid Consultants: ID, surgery, orthopedics Family Communication: None at bedside  Status is: Inpatient  Remains inpatient appropriate because: Appropriate placement because of large sacral diabetes ulcer.  Dispo: The patient is from: Home              Anticipated d/c is to: Pending insurance authorization for LTAC.              Patient currently is medically stable to d/c.   Difficult to place patient No     Infusions:   sodium chloride 250 mL (03/30/21 2214)   piperacillin-tazobactam (ZOSYN)  IV 3.375 g (04/01/21 0513)    Scheduled Meds:  vitamin C  500 mg Oral BID   Chlorhexidine Gluconate Cloth  6 each Topical Daily   collagenase   Topical BID   feeding supplement  237 mL Oral TID BM   heparin injection (subcutaneous)  5,000 Units Subcutaneous Q12H   multivitamin-lutein  1 capsule Oral Daily   potassium chloride  40 mEq Oral Once   sodium chloride flush  10-40 mL Intracatheter Q12H   valACYclovir  500 mg Oral BID    PRN meds: acetaminophen, HYDROcodone-acetaminophen, HYDROmorphone, magic mouthwash w/lidocaine, polyethylene glycol, traZODone   Antimicrobials: Anti-infectives (From admission, onward)    Start     Dose/Rate Route Frequency Ordered Stop   03/30/21 2200  valACYclovir (VALTREX) tablet 500 mg        500 mg Oral 2 times daily 03/30/21 1547     03/27/21 1508  piperacillin-tazobactam (ZOSYN) 3.375 GM/50ML IVPB       Note to Pharmacy: Joycelyn Das K: cabinet override      03/27/21 1508 03/28/21 0314   03/27/21 0600  piperacillin-tazobactam (ZOSYN) IVPB 3.375 g        3.375 g 12.5 mL/hr over 240 Minutes Intravenous Every 8 hours 03/26/21 2329     03/27/21 0517  piperacillin-tazobactam (ZOSYN) 3.375 GM/50ML IVPB       Note to Pharmacy: Kermit Balo E: cabinet override      03/27/21 0517 03/27/21 1729   03/25/21 1000  cefTRIAXone (ROCEPHIN) 2 g in sodium chloride 0.9 % 100 mL IVPB  Status:  Discontinued        2 g 200 mL/hr over  30 Minutes Intravenous Every 24 hours 03/24/21 1510 03/26/21 2329   03/24/21 2000  vancomycin (VANCOCIN) IVPB 1000 mg/200 mL premix  Status:  Discontinued        1,000 mg 200 mL/hr over 60 Minutes Intravenous Every 24 hours 03/24/21 0858 03/24/21 1026   03/24/21 2000  vancomycin (  VANCOREADY) IVPB 1000 mg/200 mL  Status:  Discontinued        1,000 mg 200 mL/hr over 60 Minutes Intravenous Every 24 hours 03/24/21 1026 03/25/21 0902   03/22/21 2000  vancomycin (VANCOREADY) IVPB 1250 mg/250 mL  Status:  Discontinued        1,250 mg 166.7 mL/hr over 90 Minutes Intravenous Every 24 hours 03/22/21 1159 03/24/21 0858   03/22/21 0600  piperacillin-tazobactam (ZOSYN) IVPB 3.375 g        3.375 g 12.5 mL/hr over 240 Minutes Intravenous Every 8 hours 03/21/21 2216 03/24/21 2359   03/21/21 2217  vancomycin variable dose per unstable renal function (pharmacist dosing)  Status:  Discontinued         Does not apply See admin instructions 03/21/21 2217 03/24/21 0859   03/21/21 1900  ceFEPIme (MAXIPIME) 2 g in sodium chloride 0.9 % 100 mL IVPB        2 g 200 mL/hr over 30 Minutes Intravenous  Once 03/21/21 1851 03/21/21 2122   03/21/21 1900  metroNIDAZOLE (FLAGYL) IVPB 500 mg        500 mg 100 mL/hr over 60 Minutes Intravenous  Once 03/21/21 1851 03/21/21 2027   03/21/21 1900  vancomycin (VANCOCIN) IVPB 1000 mg/200 mL premix        1,000 mg 200 mL/hr over 60 Minutes Intravenous  Once 03/21/21 1851 03/21/21 2151       Objective: Vitals:   04/01/21 0358 04/01/21 0917  BP: 101/72 109/65  Pulse: (!) 110 (!) 109  Resp: 20 17  Temp: 99.2 F (37.3 C) 98.5 F (36.9 C)  SpO2: 100% 99%    Intake/Output Summary (Last 24 hours) at 04/01/2021 1222 Last data filed at 04/01/2021 1007 Gross per 24 hour  Intake 600.31 ml  Output 500 ml  Net 100.31 ml    Filed Weights   03/25/21 0500 03/29/21 0500 03/31/21 0500  Weight: 42 kg 47.1 kg 47.9 kg   Weight change:  Body mass index is 15.14 kg/m.   Physical  Exam: General exam: Middle-aged Caucasian male.  Thin built Skin: No rashes, lesions or ulcers. HEENT: Atraumatic, normocephalic, no obvious bleeding Lungs: Clear to auscultation bilaterally CVS: Tachycardia, regular rhythm, no murmur GI/Abd soft, nontender, nondistended, bowel sound present Back: Decubitus ulcers at the sacrum and hip area bandaged CNS: Alert, awake, oriented x3, paraplegia at baseline Psychiatry: Sad affect Extremities: No pedal edema, no calf tenderness  Data Review: I have personally reviewed the laboratory data and studies available.  F/u labs ordered Unresulted Labs (From admission, onward)     Start     Ordered   04/01/21 0500  CBC with Differential/Platelet  Daily,   R     Question:  Specimen collection method  Answer:  Unit=Unit collect   03/31/21 0807   04/01/21 8563  Basic metabolic panel  Daily,   R     Question:  Specimen collection method  Answer:  Unit=Unit collect   03/31/21 0807   03/30/21 1546  Herpes simplex virus (HSV), DNA by PCR Sterile Swab  Once,   R       Question:  NICU - indicate swab sites:  Answer:  Oropharynx   03/30/21 1546            Signed, Terrilee Croak, MD [facility] 04/01/2021

## 2021-04-02 ENCOUNTER — Ambulatory Visit (HOSPITAL_COMMUNITY)
Admission: AD | Admit: 2021-04-02 | Discharge: 2021-04-02 | Disposition: A | Discharge status: Home / Self Care | Payer: Medicare HMO | Source: Other Acute Inpatient Hospital | Attending: Internal Medicine | Admitting: Internal Medicine

## 2021-04-02 ENCOUNTER — Inpatient Hospital Stay
Admission: AD | Admit: 2021-04-02 | Discharge: 2021-07-03 | Disposition: A | Discharge status: Home / Self Care | Payer: Self-pay | Source: Other Acute Inpatient Hospital | Attending: Internal Medicine | Admitting: Internal Medicine

## 2021-04-02 DIAGNOSIS — R6521 Severe sepsis with septic shock: Secondary | ICD-10-CM | POA: Diagnosis not present

## 2021-04-02 DIAGNOSIS — Z9359 Other cystostomy status: Secondary | ICD-10-CM

## 2021-04-02 DIAGNOSIS — A419 Sepsis, unspecified organism: Secondary | ICD-10-CM | POA: Insufficient documentation

## 2021-04-02 DIAGNOSIS — Z452 Encounter for adjustment and management of vascular access device: Secondary | ICD-10-CM

## 2021-04-02 DIAGNOSIS — T829XXA Unspecified complication of cardiac and vascular prosthetic device, implant and graft, initial encounter: Secondary | ICD-10-CM

## 2021-04-02 DIAGNOSIS — Z95828 Presence of other vascular implants and grafts: Secondary | ICD-10-CM

## 2021-04-02 DIAGNOSIS — K5900 Constipation, unspecified: Secondary | ICD-10-CM

## 2021-04-02 LAB — CBC WITH DIFFERENTIAL/PLATELET
Abs Immature Granulocytes: 0.01 10*3/uL (ref 0.00–0.07)
Basophils Absolute: 0 10*3/uL (ref 0.0–0.1)
Basophils Relative: 1 %
Eosinophils Absolute: 0 10*3/uL (ref 0.0–0.5)
Eosinophils Relative: 0 %
HCT: 23.9 % — ABNORMAL LOW (ref 39.0–52.0)
Hemoglobin: 7.4 g/dL — ABNORMAL LOW (ref 13.0–17.0)
Immature Granulocytes: 0 %
Lymphocytes Relative: 15 %
Lymphs Abs: 0.9 10*3/uL (ref 0.7–4.0)
MCH: 24.5 pg — ABNORMAL LOW (ref 26.0–34.0)
MCHC: 31 g/dL (ref 30.0–36.0)
MCV: 79.1 fL — ABNORMAL LOW (ref 80.0–100.0)
Monocytes Absolute: 0.3 10*3/uL (ref 0.1–1.0)
Monocytes Relative: 5 %
Neutro Abs: 4.6 10*3/uL (ref 1.7–7.7)
Neutrophils Relative %: 79 %
Platelets: 281 10*3/uL (ref 150–400)
RBC: 3.02 MIL/uL — ABNORMAL LOW (ref 4.22–5.81)
RDW: 22.4 % — ABNORMAL HIGH (ref 11.5–15.5)
Smear Review: NORMAL
WBC: 5.9 10*3/uL (ref 4.0–10.5)
nRBC: 0 % (ref 0.0–0.2)

## 2021-04-02 LAB — BASIC METABOLIC PANEL
Anion gap: 6 (ref 5–15)
BUN: 8 mg/dL (ref 6–20)
CO2: 26 mmol/L (ref 22–32)
Calcium: 7.9 mg/dL — ABNORMAL LOW (ref 8.9–10.3)
Chloride: 100 mmol/L (ref 98–111)
Creatinine, Ser: <0.3 mg/dL — ABNORMAL LOW (ref 0.61–1.24)
Glucose, Bld: 133 mg/dL — ABNORMAL HIGH (ref 70–99)
Potassium: 3 mmol/L — ABNORMAL LOW (ref 3.5–5.1)
Sodium: 132 mmol/L — ABNORMAL LOW (ref 135–145)

## 2021-04-02 MED ORDER — VALACYCLOVIR HCL 500 MG PO TABS: 500.0000 mg | ORAL_TABLET | Freq: Two times a day (BID) | ORAL | Status: DC

## 2021-04-02 MED ORDER — HYDROMORPHONE HCL 2 MG PO TABS: 1.0000 mg | ORAL_TABLET | Freq: Four times a day (QID) | ORAL | 0 refills | Status: DC | PRN

## 2021-04-02 MED ORDER — HYDROCODONE-ACETAMINOPHEN 5-325 MG PO TABS: 1.0000 | ORAL_TABLET | Freq: Four times a day (QID) | ORAL | 0 refills | Status: DC | PRN

## 2021-04-02 MED ORDER — COLLAGENASE 250 UNIT/GM EX OINT: TOPICAL_OINTMENT | Freq: Two times a day (BID) | CUTANEOUS | 0 refills | Status: DC

## 2021-04-02 MED ORDER — ENSURE ENLIVE PO LIQD: 237.0000 mL | Freq: Three times a day (TID) | ORAL | 12 refills | Status: AC

## 2021-04-02 MED ORDER — OCUVITE-LUTEIN PO CAPS: 1.0000 | ORAL_CAPSULE | Freq: Every day | ORAL | 0 refills | Status: AC

## 2021-04-02 MED ORDER — SODIUM CHLORIDE 0.9 % IV SOLN: 1.0000 g | Freq: Three times a day (TID) | INTRAVENOUS | Status: DC

## 2021-04-02 MED ORDER — TRAZODONE HCL 50 MG PO TABS
50.0000 mg | ORAL_TABLET | Freq: Every evening | ORAL | Status: DC | PRN
Start: 2021-04-02 — End: 2021-07-28

## 2021-04-02 MED ORDER — POLYETHYLENE GLYCOL 3350 17 G PO PACK: 17.0000 g | PACK | Freq: Every day | ORAL | 0 refills | Status: DC | PRN

## 2021-04-02 MED ORDER — MAGIC MOUTHWASH W/LIDOCAINE: 2.0000 mL | Freq: Three times a day (TID) | ORAL | 0 refills | Status: DC | PRN

## 2021-04-02 MED ORDER — CHLORHEXIDINE GLUCONATE CLOTH 2 % EX PADS: 6.0000 | MEDICATED_PAD | Freq: Every day | CUTANEOUS | Status: DC

## 2021-04-02 NOTE — Consult Note (Addendum)
WOC Nurse Consult Note: Patient receiving care in [facility]. Reason for Consult: assess right hip/buttock wound for VAC. Wound type: Extensive Stage 4 PI, surgically debrided on 03/24/21 by Dr. Hazle Quant Pressure Injury POA: Yes Measurement: Wound bed: see photos of wounds Drainage (amount, consistency, odor)  Periwound: Dressing procedure/placement/frequency: Per Surgeon Cintron-Diaz's note on 03/24/21: "This are very complex wounds and goal of debridement was for sepsis source control. Further chronic treatment of ulcers should be evaluated at a tertiary wound care center with Plastic surgery specialized in complex wounds."  I spoke with Dr. Hazle Quant via telephone just now.  I explained I have reviewed this patient's record and could not see if bone was submitted for culture during the surgical debridement.  Dr. Hazle Quant explained that bone was not submitted for pathology.  I asked, if in his opinion, it would be safe to assume there would be osteomyelitis found in the exposed bone, he responded "yes".  He also went on to explain that he did not believe VAC therapy would benefit this patient, and that the patient's wound management could be handled on an outpatient basis.  I asked if he knew if anyone had followed up on his initial recommendation of transfer to a tertiary care center with Plastic surgery for these complex wounds, as I have not found this in any provider's note after his entry.  He is unaware of action in this direction.  We further discussed that per the [facility] manufacturer guidelines, NPWT should NOT be used over bone with osteomyelitis. He has agreed that proceeding with [facility] therapy for this patient would not be in the patient's best interest.   Please consider following up for transferring the patient as recommended by Dr. Hazle Quant. Thank you for the consult.  Discussed plan of care with Dr. Hazle Quant.  WOC nurse will not follow at this time.  Please re-consult  the WOC team if needed.  Helmut Muster, RN, MSN, CWOCN, CNS-BC, pager [phone]

## 2021-04-02 NOTE — Progress Notes (Signed)
Occupational Therapy Treatment Patient Details Name: MICHAIAH HOLSOPPLE MRN: [id number] DOB: 09-03-1975 Today's Date: 04/02/2021   History of present illness Kimberly Coye is a 45yo M who comes to [facility] after a fall out of bed and being on the floor for 4 days. PMH of pressure ulcers at sacrum, Left ischium, Rt ischium, fall c T12 SCI (aged 24 per pt), paraplegia c WC mobility, neurogenic bladder, foot amputations, anxiety .   OT comments  Pt seen for OT treatment on this date. Upon arrival to room, pt awake and in high fowler's position in bed. Pt agreeable to OT tx. Pt required MIN GUARD only to sit EOB this date. While sitting EOB, pt required only SUPERVISION/SET-UP for UB bathing, UB dressing, and grooming & hygiene task d/t decreased strength, balance, and activity tolerance. While sitting EOB, pt engaged in seated UE therex (see below) and engaged in IADL task of washing table with wascloth to promote improved dynamic sitting balance and core strength. HR 120-130s throughout entirety of session. Further mobility deferred d/t pt needing wound dressing change. Pt is making good progress toward goals and continues to benefit from skilled OT services to maximize return to PLOF and minimize risk of future falls, injury, caregiver burden, and readmission. Will continue to follow POC. Discharge recommendation remains appropriate.     Recommendations for follow up therapy are one component of a multi-disciplinary discharge planning process, led by the attending physician.  Recommendations may be updated based on patient status, additional functional criteria and insurance authorization.    Follow Up Recommendations  Skilled nursing-short term rehab (<3 hours/day)    Assistance Recommended at Discharge Frequent or constant Supervision/Assistance  Equipment Recommendations  Other (comment) (defer to next venue of care)       Precautions / Restrictions Precautions Precautions:  Fall Restrictions Weight Bearing Restrictions: No Other Position/Activity Restrictions: paraplegia, stage IV pressure ulcers at sacrum       Mobility Bed Mobility Overal bed mobility: Needs Assistance Bed Mobility: Supine to Sit;Sit to Supine     Supine to sit: Min guard Sit to supine: Min guard   General bed mobility comments: using UE to manage LE, pt required no physical assist    Transfers                   General transfer comment: did not attempt d/t RN needing to perform wound dressing change     Balance Overall balance assessment: Needs assistance Sitting-balance support: No upper extremity supported;Feet unsupported Sitting balance-Leahy Scale: Good Sitting balance - Comments: Good sitting balance reaching outside BOS to wash table with washcloth                                   ADL either performed or assessed with clinical judgement   ADL Overall ADL's : Needs assistance/impaired     Grooming: Wash/dry face;Applying deodorant;Supervision/safety;Set up;Sitting   Upper Body Bathing: Supervision/ safety;Set up;Sitting       Upper Body Dressing : Supervision/safety;Set up;Sitting Upper Body Dressing Details (indicate cue type and reason): to don/doff hospital gown                          Cognition Arousal/Alertness: Awake/alert Behavior During Therapy: [facility] for tasks assessed/performed Overall Cognitive Status: Within Functional Limits for tasks assessed  Exercises General Exercises - Upper Extremity Shoulder ABduction: Strengthening;Both;Seated;10 reps Elbow Flexion: Strengthening;Both;10 reps;Seated Theraband Level (Elbow Flexion): Level 4 (Blue) Other Exercises Other Exercises: Pt engaged in IADL task for washing table with wascloth to promote improved dynamic sitting balance and core strength.           Pertinent Vitals/ Pain       Pain Assessment:  0-10 Pain Score: 6  Breathing: normal Negative Vocalization: occasional moan/groan, low speech, negative/disapproving quality Facial Expression: smiling or inexpressive Body Language: relaxed Consolability: no need to console PAINAD Score: 1 Pain Location: "all over" Pain Descriptors / Indicators: Aching Pain Intervention(s): Limited activity within patient's tolerance;RN gave pain meds during session         Frequency  Min 2X/week        Progress Toward Goals  OT Goals(current goals can now be found in the care plan section)  Progress towards OT goals: Progressing toward goals  Acute Rehab OT Goals Patient Stated Goal: to be able to transfer to wheelchair OT Goal Formulation: With patient Time For Goal Achievement: 04/11/21 Potential to Achieve Goals: Good  Plan Discharge plan remains appropriate;Frequency remains appropriate       AM-PAC OT "6 Clicks" Daily Activity     Outcome Measure   Help from another person eating meals?: None Help from another person taking care of personal grooming?: A Little Help from another person toileting, which includes using toliet, bedpan, or urinal?: Total (foley and fecal management system) Help from another person bathing (including washing, rinsing, drying)?: A Little Help from another person to put on and taking off regular upper body clothing?: A Little Help from another person to put on and taking off regular lower body clothing?: A Lot 6 Click Score: 16    End of Session Equipment Utilized During Treatment: Other (comment) (theraband)  OT Visit Diagnosis: History of falling (Z91.81);Muscle weakness (generalized) (M62.81);Pain   Activity Tolerance Patient tolerated treatment well   Patient Left in bed;with call bell/phone within reach;with bed alarm set   Nurse Communication Mobility status;Other (comment) (HR during mobility)        Time: 1058-1130 OT Time Calculation (min): 32 min  Charges: OT General  Charges $OT Visit: 1 Visit OT Treatments $Self Care/Home Management : 8-22 mins $Therapeutic Activity: 8-22 mins  Fredirick Maudlin, OTR/L [place]

## 2021-04-02 NOTE — Progress Notes (Signed)
PROGRESS NOTE  Jonathon York  DOB: 26-Aug-1975  PCP: Jonathon Ramsay, MD NTZ:[id number]  DOA: 03/21/2021  LOS: 12 days  Hospital Day: 57  Chief Complaint  Patient presents with   Fall   Brief narrative: Jonathon York is a 45 y.o. male with PMH significant for paraplegia for last 4 years, chronic sacral decubitus ulcers, neurogenic bladder with suprapubic catheter, osteomyelitis of sacrum who was able to transfer himself in and out of wheelchair at baseline.   Patient fell at home and could not get himself up.  He was found on the ground after about 4 days.  He states he drank his own urine during this period.  In the ED, he was found soiled, dehydrated Met sepsis criteria with chronic sacral ulcers acutely looking worse. He has started on IV antibiotics, IV fluid.   Admitted to ICU service.  Required pressors for septic shock. ID and general surgery consultation was obtained. With clinical improvement, patient transferred out to hospitalist service. See below for details.  Subjective: Patient was seen and examined this morning.  Propped up in bed.  Not in distress.  No new symptoms.  Tachycardia resolved.  Pending LTAC placement.  Wound care follow-up appreciated.  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 -Also seen orthopedic surgery in 11/30.  Patient has exposed bones.  He may be better served at a tertiary care center.  However he is afraid of losing his legs.  He wants to try to go to [facility] for wound care with wound VAC before considering further surgical intervention.  Pending LTAC authorization by insurance. -Remains on IV Zosyn.  ID following. -Off Levophed since 11/27. Continue IV fluid. -Currently has a fecal bag to prevent contamination of the wound  Acute on chronic blood loss anemia -Slowly  oozing blood from raw wound at the decubitus ulcer.  Ferritin low at 39. Received 3 units of PRBC so far.  Hemoglobin trending down, 7.4 today. Recent Labs    08/31/20 0602 09/01/20 0508 03/29/21 0540 03/30/21 0500 03/31/21 0730 03/31/21 0815 04/01/21 0515 04/02/21 1000  HGB 10.0*   < > 8.5* 7.7* 7.5*  --  7.3* 7.4*  MCV 72.5*   < > 77.1* 77.6* 78.8*  --  78.6* 79.1*  VITAMINB12  --   --   --   --   --  262  --   --   FOLATE  --   --   --   --  9.7  --   --   --   FERRITIN 39  --   --   --  145  --   --   --   TIBC 211*  --   --   --  127*  --   --   --   IRON 21*  --   --   --  15*  --   --   --    < > = values in this interval not displayed.    Thrombocytopenia -Improved to normal Recent Labs  Lab 03/27/21 0611 03/28/21 0537 03/29/21 0540 03/30/21 0500 03/31/21 0730 04/01/21 0515 04/02/21 1000  PLT 92* 117* 131* 186 246 244 281    Severe protein calorie malnutrition Refeeding syndrome -Dietitian consult appreciated.  Goals of care -Poor prognosis because of significant sacral decubitus ulcer with exposed bone.  Limited treatment options.  Refused nutritional status improves, he may be a candidate for plastic surgery at tertiary care center.  For now he is being pursued for LTAC placement.  Mobility: Wheelchair-bound Living condition: Lives at home alone Goals of care:   Code Status: Full Code  Nutritional status: Body mass index is 15.14 kg/m. Nutrition Problem: Severe Malnutrition Etiology: social / environmental circumstances Signs/Symptoms: energy intake < or equal to 75% for > or equal to 1 month, moderate fat depletion, severe fat depletion, moderate muscle depletion, severe muscle depletion, percent weight loss Percent weight loss: 15.8 % Diet:  Diet Order             Diet regular Room service appropriate? Yes; Fluid consistency: Thin  Diet effective now                  DVT prophylaxis:  heparin injection 5,000 Units Start: 03/29/21 2200 SCDs  Start: 03/21/21 2240   Antimicrobials: IV Zosyn Fluid: Not on IV fluid Consultants: ID, surgery, orthopedics Family Communication: None at bedside  Status is: Inpatient  Remains inpatient appropriate because: Appropriate placement because of large sacral diabetes ulcer.  Dispo: The patient is from: Home              Anticipated d/c is to: Pending insurance authorization for LTAC.              Patient currently is medically stable to d/c.   Difficult to place patient No     Infusions:   sodium chloride 250 mL (03/30/21 2214)   meropenem (MERREM) IV 1 g (04/02/21 9983)    Scheduled Meds:  vitamin C  500 mg Oral BID   Chlorhexidine Gluconate Cloth  6 each Topical Daily   collagenase   Topical BID   feeding supplement  237 mL Oral TID BM   heparin injection (subcutaneous)  5,000 Units Subcutaneous Q12H   multivitamin-lutein  1 capsule Oral Daily   sodium chloride flush  10-40 mL Intracatheter Q12H   valACYclovir  500 mg Oral BID    PRN meds: acetaminophen, HYDROcodone-acetaminophen, HYDROmorphone, magic mouthwash w/lidocaine, polyethylene glycol, traZODone   Antimicrobials: Anti-infectives (From admission, onward)    Start     Dose/Rate Route Frequency Ordered Stop   04/02/21 0030  meropenem (MERREM) 1 g in sodium chloride 0.9 % 100 mL IVPB  Status:  Discontinued        1 g 200 mL/hr over 30 Minutes Intravenous Every 8 hours 04/01/21 1645 04/01/21 1646   04/02/21 0000  meropenem (MERREM) 1 g in sodium chloride 0.9 % 100 mL IVPB        1 g 200 mL/hr over 30 Minutes Intravenous Every 8 hours 04/01/21 1646     04/01/21 2200  meropenem (MERREM) 1 g in sodium chloride 0.9 % 100 mL IVPB  Status:  Discontinued        1 g 200 mL/hr over 30 Minutes Intravenous Every 8 hours 04/01/21 1451 04/01/21 1645   03/30/21 2200  valACYclovir (VALTREX) tablet 500 mg        500 mg Oral 2 times daily 03/30/21 1547     03/27/21 1508  piperacillin-tazobactam (ZOSYN) 3.375 GM/50ML IVPB        Note to Pharmacy: Jonathon York K: cabinet override      03/27/21 1508 03/28/21 0314   03/27/21 0600  piperacillin-tazobactam (ZOSYN) IVPB 3.375 g  Status:  Discontinued        3.375 g 12.5 mL/hr over 240 Minutes Intravenous Every 8 hours  03/26/21 2329 04/01/21 1451   03/27/21 0517  piperacillin-tazobactam (ZOSYN) 3.375 GM/50ML IVPB       Note to Pharmacy: Kermit Balo E: cabinet override      03/27/21 0517 03/27/21 1729   03/25/21 1000  cefTRIAXone (ROCEPHIN) 2 g in sodium chloride 0.9 % 100 mL IVPB  Status:  Discontinued        2 g 200 mL/hr over 30 Minutes Intravenous Every 24 hours 03/24/21 1510 03/26/21 2329   03/24/21 2000  vancomycin (VANCOCIN) IVPB 1000 mg/200 mL premix  Status:  Discontinued        1,000 mg 200 mL/hr over 60 Minutes Intravenous Every 24 hours 03/24/21 0858 03/24/21 1026   03/24/21 2000  vancomycin (VANCOREADY) IVPB 1000 mg/200 mL  Status:  Discontinued        1,000 mg 200 mL/hr over 60 Minutes Intravenous Every 24 hours 03/24/21 1026 03/25/21 0902   03/22/21 2000  vancomycin (VANCOREADY) IVPB 1250 mg/250 mL  Status:  Discontinued        1,250 mg 166.7 mL/hr over 90 Minutes Intravenous Every 24 hours 03/22/21 1159 03/24/21 0858   03/22/21 0600  piperacillin-tazobactam (ZOSYN) IVPB 3.375 g        3.375 g 12.5 mL/hr over 240 Minutes Intravenous Every 8 hours 03/21/21 2216 03/24/21 2359   03/21/21 2217  vancomycin variable dose per unstable renal function (pharmacist dosing)  Status:  Discontinued         Does not apply See admin instructions 03/21/21 2217 03/24/21 0859   03/21/21 1900  ceFEPIme (MAXIPIME) 2 g in sodium chloride 0.9 % 100 mL IVPB        2 g 200 mL/hr over 30 Minutes Intravenous  Once 03/21/21 1851 03/21/21 2122   03/21/21 1900  metroNIDAZOLE (FLAGYL) IVPB 500 mg        500 mg 100 mL/hr over 60 Minutes Intravenous  Once 03/21/21 1851 03/21/21 2027   03/21/21 1900  vancomycin (VANCOCIN) IVPB 1000 mg/200 mL premix        1,000 mg 200 mL/hr over  60 Minutes Intravenous  Once 03/21/21 1851 03/21/21 2151       Objective: Vitals:   04/02/21 0516 04/02/21 0800  BP: 92/67 93/62  Pulse: 89 98  Resp: 17 18  Temp: 98.2 F (36.8 C) 98.4 F (36.9 C)  SpO2: 100% 99%    Intake/Output Summary (Last 24 hours) at 04/02/2021 1307 Last data filed at 04/02/2021 1042 Gross per 24 hour  Intake 1191.64 ml  Output 1800 ml  Net -608.36 ml    Filed Weights   03/25/21 0500 03/29/21 0500 03/31/21 0500  Weight: 42 kg 47.1 kg 47.9 kg   Weight change:  Body mass index is 15.14 kg/m.   Physical Exam: General exam: Middle-aged Caucasian male.  Thin built Skin: No rashes, lesions or ulcers. HEENT: Atraumatic, normocephalic, no obvious bleeding Lungs: Clear to auscultation bilaterally CVS: Regular rate and rhythm, no murmur GI/Abd soft, nontender, nondistended, bowel sound present Back: Decubitus ulcers at the sacrum and hip area bandaged CNS: Alert, awake, oriented x3, paraplegia at baseline Psychiatry: Sad affect Extremities: No pedal edema, no calf tenderness  Data Review: I have personally reviewed the laboratory data and studies available.  F/u labs ordered Unresulted Labs (From admission, onward)     Start     Ordered   04/01/21 0500  CBC with Differential/Platelet  Daily,   R     Question:  Specimen collection method  Answer:  Unit=Unit collect  03/31/21 0807   04/01/21 6153  Basic metabolic panel  Daily,   R     Question:  Specimen collection method  Answer:  Unit=Unit collect   03/31/21 0807   03/30/21 1546  Herpes simplex virus (HSV), DNA by PCR Sterile Swab  Once,   R       Question:  NICU - indicate swab sites:  Answer:  Oropharynx   03/30/21 1546            Signed, Terrilee Croak, MD [facility] 04/02/2021

## 2021-04-02 NOTE — TOC Transition Note (Signed)
Transition of Care [facility]) - CM/SW Discharge Note   Patient Details  Name: THARON BOMAR MRN: [id number] Date of Birth: [birth year]  Transition of Care [facility]) CM/SW Contact:  Chapman Fitch, RN Phone Number: 04/02/2021, 3:16 PM   Clinical Narrative:     Candise Bowens with [employer] confirms that patient has LTACH auth Patient to transfer to [facility] today  Bedside RN to complete transfer paper work and call report  Patient updated   Final next level of care: Long Term Acute Care (LTAC) Barriers to Discharge: Barriers Resolved   Patient Goals and CMS Choice        Discharge Placement                       Discharge Plan and Services                                     Social Determinants of Health (SDOH) Interventions     Readmission Risk Interventions No flowsheet data found.

## 2021-04-02 NOTE — TOC Progression Note (Signed)
Transition of Care [facility]) - Progression Note    Patient Details  Name: Jonathon York MRN: [id number] Date of Birth: August 21, 1975  Transition of Care [facility]) CM/SW Contact  Chapman Fitch, RN Phone Number: 04/02/2021, 1:30 PM  Clinical Narrative:     Per Candise Bowens at [employer] for [facility] pending        Expected Discharge Plan and Services                                                 Social Determinants of Health (SDOH) Interventions    Readmission Risk Interventions No flowsheet data found.

## 2021-04-02 NOTE — Discharge Summary (Signed)
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 male with PMH significant for paraplegia for last 4 years, chronic sacral decubitus ulcers, neurogenic bladder with suprapubic catheter, osteomyelitis of sacrum who was able to transfer himself in and out of wheelchair at baseline.   Patient fell at home and could not get himself up.  He was found on the ground after about 4 days.  He states he drank his own urine during this period.  In the ED, he was found soiled, dehydrated Met sepsis criteria with chronic sacral ulcers acutely looking worse. He has started on IV antibiotics, IV fluid.   Admitted to ICU service.  Required pressors for septic shock. ID and general surgery consultation was obtained. With clinical improvement, patient transferred out to hospitalist service. See below for details.  Subjective: Patient was seen and examined this morning.  Propped up in bed.  Not in distress.  No new symptoms. Tachycardia improved.  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 -Also seen orthopedic surgery in 11/30.  Patient has exposed bones.  He may be better served at a tertiary care center.  However he is afraid of losing his legs.  He wants to try to go to [facility] for wound care with wound VAC before considering further surgical  intervention.   -Discharging to LTAC today.  If he needs any further surgical/orthopedic treatment, he should be at a tertiary care center like [facility] or [facility] or [facility]. -Per ID recommendation, will discharge him on IV meropenem for a prolonged course. -Has a fecal bag to prevent contamination of the wound  Acute on chronic blood loss anemia -Slowly oozing blood from raw wound at the decubitus ulcer.  Ferritin low at 39. Received 3 units of PRBC so far.  Hemoglobin trending down, 7.4 today. Recent Labs    08/31/20 0602 09/01/20 0508 03/29/21 0540 03/30/21 0500 03/31/21 0730 03/31/21 0815 04/01/21 0515 04/02/21 1000  HGB 10.0*   < > 8.5* 7.7* 7.5*  --  7.3* 7.4*  MCV 72.5*   < > 77.1* 77.6* 78.8*  --  78.6* 79.1*  VITAMINB12  --   --   --   --   --  262  --   --   FOLATE  --   --   --   --  9.7  --   --   --   FERRITIN 39  --   --   --  145  --   --   --   TIBC 211*  --   --   --  127*  --   --   --   IRON 21*  --   --   --  15*  --   --   --    < > = values in this interval not displayed.   Severe protein  calorie malnutrition Refeeding syndrome -Dietitian consult appreciated. -Continue dietary supplements.  Goals of care -Poor prognosis because of significant sacral decubitus ulcer with exposed bone.  Limited treatment options.  If his nutritional status improves, he may be a candidate for plastic surgery at tertiary care center.  For now he is being pursued for LTAC placement.  Mobility: Wheelchair-bound Living condition: Was living at home alone Goals of care:   Code Status: Full Code  Nutritional status: Body mass index is 15.14 kg/m. Nutrition Problem: Severe Malnutrition Etiology: social / environmental circumstances Signs/Symptoms: energy intake < or equal to 75% for > or equal to 1 month, moderate fat depletion, severe fat depletion, moderate muscle depletion, severe muscle depletion, percent weight loss Percent weight loss: 15.8 %  Discharge Medications:    Allergies as of 04/02/2021       Reactions   Orange Fruit [citrus] Other (See Comments)   Blisters only from [place]!!! Patient is not allergic from all citrus   Sulfa Antibiotics    Throwing up blood         Medication List     STOP taking these medications    sodium hypochlorite 0.125 % Soln Commonly known as: DAKIN'S 1/4 STRENGTH       TAKE these medications    Chlorhexidine Gluconate Cloth 2 % Pads Apply 6 each topically daily. Start taking on: April 03, 2021   collagenase ointment Commonly known as: SANTYL Apply topically 2 (two) times daily.   feeding supplement Liqd Take 237 mLs by mouth 3 (three) times daily between meals.   HYDROcodone-acetaminophen 5-325 MG tablet Commonly known as: NORCO/VICODIN Take 1-2 tablets by mouth every 6 (six) hours as needed (breakthrough pain).   HYDROmorphone 2 MG tablet Commonly known as: DILAUDID Take 0.5 tablets (1 mg total) by mouth every 6 (six) hours as needed for moderate pain.   magic mouthwash w/lidocaine Soln Take 2 mLs by mouth 3 (three) times daily as needed for mouth pain.   meropenem 1 g in sodium chloride 0.9 % 100 mL Inject 1 g into the vein every 8 (eight) hours.   multivitamin-lutein Caps capsule Take 1 capsule by mouth daily. Start taking on: April 03, 2021   polyethylene glycol 17 g packet Commonly known as: MIRALAX / GLYCOLAX Take 17 g by mouth daily as needed for moderate constipation.   traZODone 50 MG tablet Commonly known as: DESYREL Take 1 tablet (50 mg total) by mouth at bedtime as needed for sleep.   valACYclovir 500 MG tablet Commonly known as: VALTREX Take 1 tablet (500 mg total) by mouth 2 (two) times daily.               Discharge Care Instructions  (From admission, onward)           Start     Ordered   04/02/21 0000  Discharge wound care:        04/02/21 1451            Wound care:   Pressure Injury 03/22/21 Buttocks Left Exisiting wound. Already  documented. Treatment in place (Active)  Date First Assessed/Time First Assessed: 03/22/21 0400   Location: Buttocks  Location Orientation: Left  Wound Description (Comments): Exisiting wound. Already documented. Treatment in place  Present on Admission: Yes    Assessments 03/22/2021  8:00 AM 04/01/2021 10:27 PM  Dressing Type Moist to dry Santyl;Moist to dry  Dressing Clean;Dry Changed  Dressing Change Frequency PRN Twice a day  State of Healing Non-healing --  Site / Wound Assessment -- Bleeding;Granulation tissue;Black;Pink  Margins -- Unattached edges (unapproximated)  Drainage Amount -- Minimal  Drainage Description -- Sanguineous  Treatment -- Cleansed     No Linked orders to display     Incision (Closed) 03/24/21 Buttocks Other (Comment) (Active)  Date First Assessed/Time First Assessed: 03/24/21 0912   Location: Buttocks  Location Orientation: Other (Comment)    Assessments 03/24/2021  9:35 AM 04/01/2021 10:27 PM  Dressing Type Abdominal pads Moist to dry  Dressing Clean;Dry;Intact Changed  Dressing Change Frequency -- Twice a day  Site / Wound Assessment -- Bleeding;Pink  Margins -- Unattached edges (unapproximated)  Drainage Amount None Minimal  Drainage Description -- Sanguineous  Treatment -- Cleansed     No Linked orders to display     Pressure Injury 03/22/21 Ischial tuberosity Right Stage 4 - Full thickness tissue loss with exposed bone, tendon or muscle. (Active)  Date First Assessed/Time First Assessed: 03/22/21 0700   Location: Ischial tuberosity  Location Orientation: Right  Staging: Stage 4 - Full thickness tissue loss with exposed bone, tendon or muscle.  Present on Admission: Yes    Assessments 04/01/2021 10:27 PM  Dressing Changed  Dressing Change Frequency Twice a day  Site / Wound Assessment Bleeding;Black;Pink;Yellow  Margins Unattached edges (unapproximated)  Drainage Amount Minimal  Drainage Description Sanguineous  Treatment Cleansed     No Linked  orders to display    Discharge Instructions:   Discharge Instructions     Call MD for:  difficulty breathing, headache or visual disturbances   Complete by: As directed    Call MD for:  extreme fatigue   Complete by: As directed    Call MD for:  hives   Complete by: As directed    Call MD for:  persistant dizziness or light-headedness   Complete by: As directed    Call MD for:  persistant nausea and vomiting   Complete by: As directed    Call MD for:  severe uncontrolled pain   Complete by: As directed    Call MD for:  temperature >100.4   Complete by: As directed    Diet general   Complete by: As directed    Discharge instructions   Complete by: As directed    General discharge instructions:  Follow with Primary MD Jonathon Ramsay, MD in 7 days   Get CBC/BMP checked in next visit within 1 week by PCP or SNF MD. (We routinely change or add medications that can affect your baseline labs and fluid status, therefore we recommend that you get the mentioned basic workup next visit with your PCP, your PCP may decide not to get them or add new tests based on their clinical decision)  On your next visit with your PCP, please get your medicines reviewed and adjusted.  Please request your PCP  to go over all hospital tests, procedures, radiology results at the follow up, please get all Hospital records sent to your PCP by signing hospital release before you go home.  Activity: As tolerated with Full fall precautions use walker/cane & assistance as needed  Avoid using any recreational substances like cigarette, tobacco, alcohol, or non-prescribed drug.  If you experience worsening of your admission symptoms, develop shortness of breath, life threatening emergency, suicidal or homicidal thoughts you must seek medical attention immediately by calling 911 or calling your MD immediately  if symptoms less severe.  You must read complete instructions/literature along with all the  possible adverse reactions/side effects for  all the medicines you take and that have been prescribed to you. Take any new medicine only after you have completely understood and accepted all the possible adverse reactions/side effects.   Do not drive, operate heavy machinery, perform activities at heights, swimming or participation in water activities or provide baby sitting services if your were admitted for syncope or siezures until you have seen by Primary MD or a Neurologist and advised to do so again.  Do not drive when taking Pain medications.  Do not take more than prescribed Pain, Sleep and Anxiety Medications  Wear Seat belts while driving.  Please note You were cared for by a hospitalist during your hospital stay. If you have any questions about your discharge medications or the care you received while you were in the hospital after you are discharged, you can call the unit and asked to speak with the hospitalist on call if the hospitalist that took care of you is not available. Once you are discharged, your primary care physician will handle any further medical issues. Please note that NO REFILLS for any discharge medications will be authorized once you are discharged, as it is imperative that you return to your primary care physician (or establish a relationship with a primary care physician if you do not have one) for your aftercare needs so that they can reassess your need for medications and monitor your lab values.   Discharge wound care:   Complete by: As directed    Increase activity slowly   Complete by: As directed        Follow ups:    Follow-up Information     Jonathon Ramsay, MD Follow up.   Specialty: Infectious Diseases Contact information: [place] [address] [phone]                 Discharge Exam:   Vitals:   04/01/21 0917 04/01/21 2040 04/02/21 0516 04/02/21 0800  BP: 109/65 97/65 92/67  93/62  Pulse: (!) 109 97 89  98  Resp: 17 17 17 18   Temp: 98.5 F (36.9 C) 98.4 F (36.9 C) 98.2 F (36.8 C) 98.4 F (36.9 C)  TempSrc:    Oral  SpO2: 99% 99% 100% 99%  Weight:      Height:        Body mass index is 15.14 kg/m.  General exam: Middle-aged Caucasian male.  Thin built Skin: No rashes, lesions or ulcers. HEENT: Atraumatic, normocephalic, no obvious bleeding Lungs: Clear to auscultation bilaterally CVS: Regular rate and rhythm, no murmur GI/Abd soft, nontender, nondistended, bowel sound present Back: Decubitus ulcers at the sacrum and hip area bandaged CNS: Alert, awake, oriented x3, paraplegia at baseline Psychiatry: Sad affect Extremities: No pedal edema, no calf tenderness  Time coordinating discharge: 35 minutes   The results of significant diagnostics from this hospitalization (including imaging, microbiology, ancillary and laboratory) are listed below for reference.    Procedures and Diagnostic Studies:   DG Chest Port 1 View  Result Date: 03/21/2021 CLINICAL DATA:  Possible sepsis EXAM: PORTABLE CHEST 1 VIEW COMPARISON:  08/28/2020, CT from 02/15/2017 FINDINGS: Cardiac shadow is within normal limits. Postsurgical changes are noted in the thoracolumbar spine stable from the prior study. Lungs are well aerated. No focal infiltrate or effusion is seen. Tinycalcification is noted superimposed over the anterior aspect of the right sixth rib. Correlation with prior CT from 2018 shows this to be a small bone island within the anterior right sixth rib. IMPRESSION: No  acute abnormality noted. Electronically Signed   By: Inez Catalina M.D.   On: 03/21/2021 19:30     Labs:   Basic Metabolic Panel: Recent Labs  Lab 03/29/21 0540 03/30/21 0500 03/31/21 0730 04/01/21 0515 04/02/21 1000  NA 135 136 135 132* 132*  K 3.6 2.9* 3.5 3.3* 3.0*  CL 105 105 102 100 100  CO2 26 27 26 27 26   GLUCOSE 88 89 93 93 133*  BUN 10 7 7 7 8   CREATININE <0.30* <0.30* <0.30* <0.30* <0.30*  CALCIUM 7.9*  7.8* 7.9* 7.9* 7.9*   GFR CrCl cannot be calculated (This lab value cannot be used to calculate CrCl because it is not a number: <0.30). Liver Function Tests: Recent Labs  Lab 03/27/21 0611 03/28/21 0537  AST 29 20  ALT 21 17  ALKPHOS 54 45  BILITOT 0.9 0.4  PROT 5.8* 5.3*  ALBUMIN 2.7* 2.2*   No results for input(s): LIPASE, AMYLASE in the last 168 hours. No results for input(s): AMMONIA in the last 168 hours. Coagulation profile No results for input(s): INR, PROTIME in the last 168 hours.  CBC: Recent Labs  Lab 03/29/21 0540 03/30/21 0500 03/31/21 0730 04/01/21 0515 04/02/21 1000  WBC 8.8 6.9 6.9 7.1 5.9  NEUTROABS 6.7 5.0 5.0 5.5 4.6  HGB 8.5* 7.7* 7.5* 7.3* 7.4*  HCT 26.6* 24.9* 23.8* 23.1* 23.9*  MCV 77.1* 77.6* 78.8* 78.6* 79.1*  PLT 131* 186 246 244 281   Cardiac Enzymes: No results for input(s): CKTOTAL, CKMB, CKMBINDEX, TROPONINI in the last 168 hours. BNP: Invalid input(s): POCBNP CBG: Recent Labs  Lab 04/01/21 0846  GLUCAP 92   D-Dimer No results for input(s): DDIMER in the last 72 hours. Hgb A1c No results for input(s): HGBA1C in the last 72 hours. Lipid Profile No results for input(s): CHOL, HDL, LDLCALC, TRIG, CHOLHDL, LDLDIRECT in the last 72 hours. Thyroid function studies No results for input(s): TSH, T4TOTAL, T3FREE, THYROIDAB in the last 72 hours.  Invalid input(s): FREET3 Anemia work up Recent Labs    03/31/21 0730 03/31/21 0815  VITAMINB12  --  262  FOLATE 9.7  --   FERRITIN 145  --   TIBC 127*  --   IRON 15*  --    Microbiology Recent Results (from the past 240 hour(s))  CULTURE, BLOOD (ROUTINE X 2) w Reflex to ID Panel     Status: None   Collection Time: 03/27/21  6:10 AM   Specimen: BLOOD  Result Value Ref Range Status   Specimen Description BLOOD LEFT HAND  Final   Special Requests   Final    BOTTLES DRAWN AEROBIC AND ANAEROBIC Blood Culture adequate volume   Culture   Final    NO GROWTH 5 DAYS Performed at  [facility], [address] [place], [place] [address]    Report Status 04/01/2021 FINAL  Final  CULTURE, BLOOD (ROUTINE X 2) w Reflex to ID Panel     Status: None   Collection Time: 03/27/21  6:11 AM   Specimen: BLOOD  Result Value Ref Range Status   Specimen Description BLOOD LEFT ASSIST CONTROL  Final   Special Requests   Final    BOTTLES DRAWN AEROBIC AND ANAEROBIC Blood Culture adequate volume   Culture   Final    NO GROWTH 5 DAYS Performed at [facility], [address], [place], [place] [address]    Report Status 04/01/2021 FINAL  Final  Aerobic Culture w Gram Stain (superficial specimen)  Status: None   Collection Time: 03/27/21  4:05 PM   Specimen: Wound  Result Value Ref Range Status   Specimen Description WOUND  Final   Special Requests RIGHT HIP  Final   Gram Stain   Final    RARE WBC PRESENT, PREDOMINANTLY PMN NO ORGANISMS SEEN    Culture FEW PSEUDOMONAS AERUGINOSA  Final   Report Status 03/31/2021 FINAL  Final   Organism ID, Bacteria PSEUDOMONAS AERUGINOSA  Final      Susceptibility   Pseudomonas aeruginosa - MIC*    CEFTAZIDIME 16 INTERMEDIATE Intermediate     CIPROFLOXACIN >=4 RESISTANT Resistant     GENTAMICIN <=1 SENSITIVE Sensitive     IMIPENEM 2 SENSITIVE Sensitive     PIP/TAZO Value in next row Resistant      32 RESISTANTPerformed at [facility], [address], [place], [place] [address]    * FEW PSEUDOMONAS AERUGINOSA     Signed: Terrilee Croak  [facility] 04/02/2021, 2:52 PM

## 2021-04-03 DIAGNOSIS — R509 Fever, unspecified: Secondary | ICD-10-CM | POA: Diagnosis not present

## 2021-04-03 DIAGNOSIS — E43 Unspecified severe protein-calorie malnutrition: Secondary | ICD-10-CM | POA: Diagnosis not present

## 2021-04-03 DIAGNOSIS — M8669 Other chronic osteomyelitis, multiple sites: Secondary | ICD-10-CM | POA: Diagnosis not present

## 2021-04-03 DIAGNOSIS — G8221 Paraplegia, complete: Secondary | ICD-10-CM | POA: Diagnosis not present

## 2021-04-03 LAB — URINALYSIS, ROUTINE W REFLEX MICROSCOPIC
Bilirubin Urine: NEGATIVE
Glucose, UA: NEGATIVE mg/dL
Hgb urine dipstick: NEGATIVE
Ketones, ur: NEGATIVE mg/dL
Leukocytes,Ua: NEGATIVE
Nitrite: NEGATIVE
Protein, ur: NEGATIVE mg/dL
Specific Gravity, Urine: 1.01 (ref 1.005–1.030)
pH: 6.5 (ref 5.0–8.0)

## 2021-04-04 DIAGNOSIS — G8220 Paraplegia, unspecified: Secondary | ICD-10-CM | POA: Diagnosis not present

## 2021-04-04 DIAGNOSIS — E43 Unspecified severe protein-calorie malnutrition: Secondary | ICD-10-CM | POA: Diagnosis not present

## 2021-04-04 DIAGNOSIS — M869 Osteomyelitis, unspecified: Secondary | ICD-10-CM | POA: Diagnosis not present

## 2021-04-04 DIAGNOSIS — D649 Anemia, unspecified: Secondary | ICD-10-CM | POA: Diagnosis not present

## 2021-04-04 LAB — URINE CULTURE: Culture: NO GROWTH

## 2021-04-05 DIAGNOSIS — D649 Anemia, unspecified: Secondary | ICD-10-CM | POA: Diagnosis not present

## 2021-04-05 DIAGNOSIS — M86652 Other chronic osteomyelitis, left thigh: Secondary | ICD-10-CM | POA: Diagnosis not present

## 2021-04-05 DIAGNOSIS — N179 Acute kidney failure, unspecified: Secondary | ICD-10-CM | POA: Diagnosis not present

## 2021-04-05 DIAGNOSIS — G8220 Paraplegia, unspecified: Secondary | ICD-10-CM | POA: Diagnosis not present

## 2021-04-06 DIAGNOSIS — E43 Unspecified severe protein-calorie malnutrition: Secondary | ICD-10-CM | POA: Diagnosis not present

## 2021-04-06 DIAGNOSIS — R000 Tachycardia, unspecified: Secondary | ICD-10-CM | POA: Diagnosis not present

## 2021-04-06 DIAGNOSIS — D649 Anemia, unspecified: Secondary | ICD-10-CM | POA: Diagnosis not present

## 2021-04-06 DIAGNOSIS — M86659 Other chronic osteomyelitis, unspecified thigh: Secondary | ICD-10-CM | POA: Diagnosis not present

## 2021-04-06 LAB — CBC
HCT: 24.6 % — ABNORMAL LOW (ref 39.0–52.0)
Hemoglobin: 7.5 g/dL — ABNORMAL LOW (ref 13.0–17.0)
MCH: 24.5 pg — ABNORMAL LOW (ref 26.0–34.0)
MCHC: 30.5 g/dL (ref 30.0–36.0)
MCV: 80.4 fL (ref 80.0–100.0)
Platelets: 461 10*3/uL — ABNORMAL HIGH (ref 150–400)
RBC: 3.06 MIL/uL — ABNORMAL LOW (ref 4.22–5.81)
RDW: 22.5 % — ABNORMAL HIGH (ref 11.5–15.5)
WBC: 5.1 10*3/uL (ref 4.0–10.5)
nRBC: 0 % (ref 0.0–0.2)

## 2021-04-06 LAB — BASIC METABOLIC PANEL
Anion gap: 6 (ref 5–15)
BUN: 9 mg/dL (ref 6–20)
CO2: 25 mmol/L (ref 22–32)
Calcium: 7.9 mg/dL — ABNORMAL LOW (ref 8.9–10.3)
Chloride: 99 mmol/L (ref 98–111)
Creatinine, Ser: 0.51 mg/dL — ABNORMAL LOW (ref 0.61–1.24)
GFR, Estimated: >60 mL/min (ref >60)
Glucose, Bld: 114 mg/dL — ABNORMAL HIGH (ref 70–99)
Potassium: 3.8 mmol/L (ref 3.5–5.1)
Sodium: 130 mmol/L — ABNORMAL LOW (ref 135–145)

## 2021-04-07 DIAGNOSIS — G8220 Paraplegia, unspecified: Secondary | ICD-10-CM | POA: Diagnosis not present

## 2021-04-07 DIAGNOSIS — L89154 Pressure ulcer of sacral region, stage 4: Secondary | ICD-10-CM | POA: Diagnosis not present

## 2021-04-07 DIAGNOSIS — D649 Anemia, unspecified: Secondary | ICD-10-CM | POA: Diagnosis not present

## 2021-04-07 DIAGNOSIS — R7881 Bacteremia: Secondary | ICD-10-CM | POA: Diagnosis not present

## 2021-04-07 DIAGNOSIS — N179 Acute kidney failure, unspecified: Secondary | ICD-10-CM | POA: Diagnosis not present

## 2021-04-07 DIAGNOSIS — M869 Osteomyelitis, unspecified: Secondary | ICD-10-CM | POA: Diagnosis not present

## 2021-04-07 DIAGNOSIS — E43 Unspecified severe protein-calorie malnutrition: Secondary | ICD-10-CM | POA: Diagnosis not present

## 2021-04-07 LAB — MAGNESIUM: Magnesium: 2.1 mg/dL (ref 1.7–2.4)

## 2021-04-07 LAB — PHOSPHORUS: Phosphorus: 3.1 mg/dL (ref 2.5–4.6)

## 2021-04-07 NOTE — Consult Note (Signed)
Infectious Disease Consultation   Jonathon York  SWN:[id number]  DOB: 02-Apr-1976  DOA: 04/02/2021  Requesting physician: Dr. Owens Shark  Reason for consultation: Antibiotic Recommendations  History of Present Illness: Jonathon York is an 45 y.o. male with medical history significant of paraplegia for the last 4 years, chronic sacral decubitus ulcers, neurogenic bladder with suprapubic catheter, osteomyelitis of the sacrum.  Patient apparently fell at home and could not get himself.  He was found on the ground by the family about 4 days after.  He apparently drank his own urine during this period.  He was found to be covered in stool.  He presented to [facility] on 03/21/2021.  At the time of admission he met sepsis criteria with worsening of the chronic sacral pressure ulcers.  He was initially admitted to the ICU service and was placed on pressors for septic shock.  03/24/2021 he underwent excisional debridement of the sacral and bilateral gluteal wounds by general surgery.  He was also seen by orthopedic surgery on 03/28/2021 due to exposed bones.  He was initially started on broad-spectrum antimicrobials. His blood cultures at the acute facility showed strep anginosis and he was de-escalated to IV ceftriaxone.  He was later escalated to IV Zosyn.  His hospital course complicated by AKI.  His wound cultures showed Pseudomonas which was resistant to Zosyn.  He has been on treatment with IV meropenem.  Due to his complex medical problems he was transferred to [place].   Review of Systems:  ROS As per HPI otherwise 10 point review of systems negative.    Past Medical History: Past Medical History:  Diagnosis Date   AKI (acute kidney injury) ([place])    Anxiety    Decubitus ulcer    Depression    Neurogenic bladder    Osteomyelitis ([place])    Paraparesis of both lower limbs ([place]) 02/21/00    Past Surgical History: Past Surgical History:  Procedure Laterality  Date   AMPUTATION Right 03/19/2018   Procedure: 5th Metatarsal Resection;  Surgeon: Albertine Patricia, DPM;  Location: [facility] ORS;  Service: Podiatry;  Laterality: Right;   AMPUTATION TOE Right 04/29/2015   Procedure: AMPUTATION TOE;  Surgeon: Sharlotte Alamo, MD;  Location: [facility] ORS;  Service: Podiatry;  Laterality: Right;   INCISION AND DRAINAGE ABSCESS N/A 03/24/2021   Procedure: INCISION AND DRAINAGE STAGE IV DECUBITUS ULCER;  Surgeon: Herbert Pun, MD;  Location: [facility] ORS;  Service: General;  Laterality: N/A;   IRRIGATION AND DEBRIDEMENT BUTTOCKS     TOE AMPUTATION Right      Allergies:   Allergies  Allergen Reactions   Orange Fruit [Citrus] Other (See Comments)    Blisters only from [place]!!! Patient is not allergic from all citrus   Sulfa Antibiotics     Throwing up blood      Social History:  reports that he quit smoking about 4 years ago. His smoking use included cigarettes. He smoked an average of .5 packs per day. He has never used smokeless tobacco. He reports current drug use. Drug: Marijuana. He reports that he does not drink alcohol.   Family History: Family History  Problem Relation Age of Onset   Lymphoma Sister    Physical Exam: Vitals: Temperature 99.1, heart rate 118, respiratory rate 18, blood pressure 100/60, oxygen saturation 100%  Constitutional: Chronically ill-appearing male, awake Head: Atraumatic, normocephalic Eyes: PERLA, EOMI, irises appear normal, anicteric sclera,  ENMT: external ears  and nose appear normal, normal hearing, moist oral mucosa             Neck: Supple CVS: S1-S2  Respiratory: Decreased breath sounds lower lobes, no rhonchi or wheezing Abdomen: soft nontender, nondistended, normal bowel sounds Musculoskeletal: Paraplegic Neuro: Paraplegic Psych: stable mood and affect, mental status Skin: Sacral and hip wounds, right hip bone sticking through the wound with extensive loss of tissue, dressing in place  Data reviewed:  I  have personally reviewed following labs and imaging studies Labs:  CBC: Recent Labs  Lab 04/01/21 0515 04/02/21 1000 04/06/21 1328  WBC 7.1 5.9 5.1  NEUTROABS 5.5 4.6  --   HGB 7.3* 7.4* 7.5*  HCT 23.1* 23.9* 24.6*  MCV 78.6* 79.1* 80.4  PLT 244 281 461*    Basic Metabolic Panel: Recent Labs  Lab 04/01/21 0515 04/02/21 1000 04/06/21 1328 04/07/21 0437  NA 132* 132* 130*  --   K 3.3* 3.0* 3.8  --   CL 100 100 99  --   CO2 27 26 25   --   GLUCOSE 93 133* 114*  --   BUN 7 8 9   --   CREATININE <0.30* <0.30* 0.51*  --   CALCIUM 7.9* 7.9* 7.9*  --   MG  --   --   --  2.1  PHOS  --   --   --  3.1   GFR Estimated Creatinine Clearance: 79 mL/min (A) (by C-G formula based on SCr of 0.51 mg/dL (L)). Liver Function Tests: No results for input(s): AST, ALT, ALKPHOS, BILITOT, PROT, ALBUMIN in the last 168 hours. No results for input(s): LIPASE, AMYLASE in the last 168 hours. No results for input(s): AMMONIA in the last 168 hours. Coagulation profile No results for input(s): INR, PROTIME in the last 168 hours.  Cardiac Enzymes: No results for input(s): CKTOTAL, CKMB, CKMBINDEX, TROPONINI in the last 168 hours. BNP: Invalid input(s): POCBNP CBG: Recent Labs  Lab 04/01/21 0846  GLUCAP 92   D-Dimer No results for input(s): DDIMER in the last 72 hours. Hgb A1c No results for input(s): HGBA1C in the last 72 hours. Lipid Profile No results for input(s): CHOL, HDL, LDLCALC, TRIG, CHOLHDL, LDLDIRECT in the last 72 hours. Thyroid function studies No results for input(s): TSH, T4TOTAL, T3FREE, THYROIDAB in the last 72 hours.  Invalid input(s): FREET3 Anemia work up No results for input(s): VITAMINB12, FOLATE, FERRITIN, TIBC, IRON, RETICCTPCT in the last 72 hours. Urinalysis    Component Value Date/Time   COLORURINE YELLOW 04/03/2021 1530   APPEARANCEUR CLEAR 04/03/2021 1530   APPEARANCEUR Turbid 12/24/2013 0313   LABSPEC 1.010 04/03/2021 1530   LABSPEC 1.021 12/24/2013  0313   PHURINE 6.5 04/03/2021 1530   GLUCOSEU NEGATIVE 04/03/2021 1530   GLUCOSEU Negative 12/24/2013 0313   HGBUR NEGATIVE 04/03/2021 1530   BILIRUBINUR NEGATIVE 04/03/2021 1530   BILIRUBINUR Negative 12/24/2013 0313   KETONESUR NEGATIVE 04/03/2021 1530   PROTEINUR NEGATIVE 04/03/2021 1530   NITRITE NEGATIVE 04/03/2021 1530   LEUKOCYTESUR NEGATIVE 04/03/2021 1530   LEUKOCYTESUR 1+ 12/24/2013 0313     Sepsis Labs Invalid input(s): PROCALCITONIN,  WBC,  LACTICIDVEN Microbiology Recent Results (from the past 240 hour(s))  Urine Culture     Status: None   Collection Time: 04/03/21  3:25 PM   Specimen: Urine, Clean Catch  Result Value Ref Range Status   Specimen Description URINE, CLEAN CATCH  Final   Special Requests NONE  Final   Culture   Final    NO  GROWTH Performed at [facility], [place] [address], [place], [place] [address]    Report Status 04/04/2021 FINAL  Final  Culture, blood (routine x 2)     Status: None (Preliminary result)   Collection Time: 04/04/21  6:18 PM   Specimen: BLOOD  Result Value Ref Range Status   Specimen Description BLOOD SITE NOT SPECIFIED  Final   Special Requests   Final    BOTTLES DRAWN AEROBIC AND ANAEROBIC Blood Culture adequate volume   Culture   Final    NO GROWTH 3 DAYS Performed at [facility], [place] [address] [place], [place] [address]    Report Status PENDING  Incomplete  Culture, blood (routine x 2)     Status: None (Preliminary result)   Collection Time: 04/04/21  6:18 PM   Specimen: BLOOD  Result Value Ref Range Status   Specimen Description BLOOD SITE NOT SPECIFIED  Final   Special Requests   Final    BOTTLES DRAWN AEROBIC ONLY Blood Culture results may not be optimal due to an inadequate volume of blood received in culture bottles   Culture   Final    NO GROWTH 3 DAYS Performed at [facility], [place] [address] [place], [place] [address]    Report Status PENDING  Incomplete    Inpatient Medications:    Scheduled Meds: Please see MAR   Radiological Exams on Admission: No results found.  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  Remains on treatment with IV meropenem.  Unfortunately has exposure of the bone.  The Pseudomonas was resistant to Zosyn and cephalosporins.  Recommend to continue treatment with IV meropenem.  Please monitor CBC, BUN/creatinine closely while on antibiotics.  Unfortunately antibiotic alone is not going to help as this may never heal and he might need Girdlestone/flap/graft procedure.  Stage IV sacral pressure ulcer/right trochanteric/right gluteal stage IV pressure ulcer: Continue local wound care.  Antibiotics as mentioned above.  If worsening consider consulting surgery.  Unfortunately due to his paraplegia and inability to offload this is unlikely to heal.  He will need diverting ostomy.  Bacteremia: He had bacteremia with strep anginosis at the acute facility and was treated with ceftriaxone.  Repeat blood cultures were negative.  Currently on IV meropenem as mentioned above.  Paraplegia: Due to his paraplegia and immobility he is high risk for worsening of his pressure ulcers.  Acute kidney injury: Continue to monitor BUN/creatinine closely.  Chronic suprapubic catheter: Due to this he is high risk for recurrent UTI.  On antibiotics as mentioned above.  Anemia/severe protein calorie malnutrition: Continue to monitor hemoglobin.  Management of protein calorie malnutrition per the primary team. Unfortunately due to his complex  medical problems he is high risk for worsening and decompensation.  Plan of care discussed with the patient, primary team and pharmacy.   Thank you for this consultation.   Yaakov Guthrie M.D. 04/07/2021, 10:31 PM

## 2021-04-08 DIAGNOSIS — E43 Unspecified severe protein-calorie malnutrition: Secondary | ICD-10-CM | POA: Diagnosis not present

## 2021-04-08 DIAGNOSIS — M869 Osteomyelitis, unspecified: Secondary | ICD-10-CM | POA: Diagnosis not present

## 2021-04-08 DIAGNOSIS — G8220 Paraplegia, unspecified: Secondary | ICD-10-CM | POA: Diagnosis not present

## 2021-04-08 DIAGNOSIS — D649 Anemia, unspecified: Secondary | ICD-10-CM | POA: Diagnosis not present

## 2021-04-08 LAB — CBC
HCT: 22.8 % — ABNORMAL LOW (ref 39.0–52.0)
Hemoglobin: 7 g/dL — ABNORMAL LOW (ref 13.0–17.0)
MCH: 24.5 pg — ABNORMAL LOW (ref 26.0–34.0)
MCHC: 30.7 g/dL (ref 30.0–36.0)
MCV: 79.7 fL — ABNORMAL LOW (ref 80.0–100.0)
Platelets: 454 10*3/uL — ABNORMAL HIGH (ref 150–400)
RBC: 2.86 MIL/uL — ABNORMAL LOW (ref 4.22–5.81)
RDW: 22.3 % — ABNORMAL HIGH (ref 11.5–15.5)
WBC: 6.3 10*3/uL (ref 4.0–10.5)
nRBC: 0.3 % — ABNORMAL HIGH (ref 0.0–0.2)

## 2021-04-08 LAB — PREPARE RBC (CROSSMATCH)

## 2021-04-09 DIAGNOSIS — R509 Fever, unspecified: Secondary | ICD-10-CM | POA: Diagnosis not present

## 2021-04-09 DIAGNOSIS — M8669 Other chronic osteomyelitis, multiple sites: Secondary | ICD-10-CM | POA: Diagnosis not present

## 2021-04-09 DIAGNOSIS — E43 Unspecified severe protein-calorie malnutrition: Secondary | ICD-10-CM | POA: Diagnosis not present

## 2021-04-09 DIAGNOSIS — G8221 Paraplegia, complete: Secondary | ICD-10-CM | POA: Diagnosis not present

## 2021-04-09 LAB — COMPREHENSIVE METABOLIC PANEL
ALT: 123 U/L — ABNORMAL HIGH (ref 0–44)
AST: 89 U/L — ABNORMAL HIGH (ref 15–41)
Albumin: 1.5 g/dL — ABNORMAL LOW (ref 3.5–5.0)
Alkaline Phosphatase: 136 U/L — ABNORMAL HIGH (ref 38–126)
Anion gap: 5 (ref 5–15)
BUN: 11 mg/dL (ref 6–20)
CO2: 26 mmol/L (ref 22–32)
Calcium: 7.8 mg/dL — ABNORMAL LOW (ref 8.9–10.3)
Chloride: 100 mmol/L (ref 98–111)
Creatinine, Ser: 0.31 mg/dL — ABNORMAL LOW (ref 0.61–1.24)
GFR, Estimated: >60 mL/min (ref >60)
Glucose, Bld: 113 mg/dL — ABNORMAL HIGH (ref 70–99)
Potassium: 4.1 mmol/L (ref 3.5–5.1)
Sodium: 131 mmol/L — ABNORMAL LOW (ref 135–145)
Total Bilirubin: 0.2 mg/dL — ABNORMAL LOW (ref 0.3–1.2)
Total Protein: 6.2 g/dL — ABNORMAL LOW (ref 6.5–8.1)

## 2021-04-09 LAB — TYPE AND SCREEN
ABO/RH(D): O POS
Antibody Screen: NEGATIVE
Unit division: 0

## 2021-04-09 LAB — CBC
HCT: 27.5 % — ABNORMAL LOW (ref 39.0–52.0)
Hemoglobin: 8.5 g/dL — ABNORMAL LOW (ref 13.0–17.0)
MCH: 25.3 pg — ABNORMAL LOW (ref 26.0–34.0)
MCHC: 30.9 g/dL (ref 30.0–36.0)
MCV: 81.8 fL (ref 80.0–100.0)
Platelets: 534 10*3/uL — ABNORMAL HIGH (ref 150–400)
RBC: 3.36 MIL/uL — ABNORMAL LOW (ref 4.22–5.81)
RDW: 21.4 % — ABNORMAL HIGH (ref 11.5–15.5)
WBC: 6 10*3/uL (ref 4.0–10.5)
nRBC: 0.7 % — ABNORMAL HIGH (ref 0.0–0.2)

## 2021-04-09 LAB — CULTURE, BLOOD (ROUTINE X 2)
Culture: NO GROWTH
Culture: NO GROWTH
Special Requests: ADEQUATE

## 2021-04-09 LAB — BPAM RBC
Blood Product Expiration Date: 202301032359
ISSUE DATE / TIME: 202212111109
Unit Type and Rh: 5100

## 2021-04-09 LAB — TRIGLYCERIDES: Triglycerides: 108 mg/dL (ref <150)

## 2021-04-09 LAB — MAGNESIUM: Magnesium: 2.3 mg/dL (ref 1.7–2.4)

## 2021-04-09 LAB — PHOSPHORUS: Phosphorus: 3.6 mg/dL (ref 2.5–4.6)

## 2021-04-10 DIAGNOSIS — G8221 Paraplegia, complete: Secondary | ICD-10-CM | POA: Diagnosis not present

## 2021-04-10 DIAGNOSIS — M8669 Other chronic osteomyelitis, multiple sites: Secondary | ICD-10-CM | POA: Diagnosis not present

## 2021-04-10 DIAGNOSIS — E43 Unspecified severe protein-calorie malnutrition: Secondary | ICD-10-CM | POA: Diagnosis not present

## 2021-04-10 DIAGNOSIS — R509 Fever, unspecified: Secondary | ICD-10-CM | POA: Diagnosis not present

## 2021-04-10 LAB — PHOSPHORUS: Phosphorus: 3.6 mg/dL (ref 2.5–4.6)

## 2021-04-10 LAB — MAGNESIUM: Magnesium: 2.3 mg/dL (ref 1.7–2.4)

## 2021-04-11 DIAGNOSIS — G8221 Paraplegia, complete: Secondary | ICD-10-CM | POA: Diagnosis not present

## 2021-04-11 DIAGNOSIS — M8669 Other chronic osteomyelitis, multiple sites: Secondary | ICD-10-CM | POA: Diagnosis not present

## 2021-04-11 DIAGNOSIS — R509 Fever, unspecified: Secondary | ICD-10-CM | POA: Diagnosis not present

## 2021-04-11 DIAGNOSIS — E43 Unspecified severe protein-calorie malnutrition: Secondary | ICD-10-CM | POA: Diagnosis not present

## 2021-04-11 LAB — TRIGLYCERIDES: Triglycerides: 100 mg/dL (ref <150)

## 2021-04-12 DIAGNOSIS — E46 Unspecified protein-calorie malnutrition: Secondary | ICD-10-CM | POA: Diagnosis not present

## 2021-04-12 DIAGNOSIS — R509 Fever, unspecified: Secondary | ICD-10-CM | POA: Diagnosis not present

## 2021-04-12 DIAGNOSIS — L89154 Pressure ulcer of sacral region, stage 4: Secondary | ICD-10-CM | POA: Diagnosis not present

## 2021-04-12 DIAGNOSIS — G8220 Paraplegia, unspecified: Secondary | ICD-10-CM | POA: Diagnosis not present

## 2021-04-12 DIAGNOSIS — E43 Unspecified severe protein-calorie malnutrition: Secondary | ICD-10-CM | POA: Diagnosis not present

## 2021-04-12 DIAGNOSIS — G8221 Paraplegia, complete: Secondary | ICD-10-CM | POA: Diagnosis not present

## 2021-04-12 DIAGNOSIS — M86651 Other chronic osteomyelitis, right thigh: Secondary | ICD-10-CM | POA: Diagnosis not present

## 2021-04-12 DIAGNOSIS — M8669 Other chronic osteomyelitis, multiple sites: Secondary | ICD-10-CM | POA: Diagnosis not present

## 2021-04-12 LAB — BASIC METABOLIC PANEL
Anion gap: 6 (ref 5–15)
BUN: 14 mg/dL (ref 6–20)
CO2: 25 mmol/L (ref 22–32)
Calcium: 8 mg/dL — ABNORMAL LOW (ref 8.9–10.3)
Chloride: 104 mmol/L (ref 98–111)
Creatinine, Ser: 0.32 mg/dL — ABNORMAL LOW (ref 0.61–1.24)
GFR, Estimated: >60 mL/min (ref >60)
Glucose, Bld: 110 mg/dL — ABNORMAL HIGH (ref 70–99)
Potassium: 4.1 mmol/L (ref 3.5–5.1)
Sodium: 135 mmol/L (ref 135–145)

## 2021-04-12 NOTE — Progress Notes (Signed)
PROGRESS NOTE    Jonathon York  JSE:[id number] DOB: Sep 16, 1975 DOA: 04/02/2021  Brief Narrative:  Jonathon York is an 45 y.o. male with medical history significant of paraplegia for the last 4 years, chronic sacral decubitus ulcers, neurogenic bladder with suprapubic catheter, osteomyelitis of the sacrum.  Patient apparently fell at home and could not get himself.  He was found on the ground by the family about 4 days after.  He apparently drank his own urine during this period.  He was found to be covered in stool.  He presented to [facility] on 03/21/2021.  At the time of admission he met sepsis criteria with worsening of the chronic sacral pressure ulcers.  He was initially admitted to the ICU service and was placed on pressors for septic shock.  03/24/2021 he underwent excisional debridement of the sacral and bilateral gluteal wounds by general surgery.  He was also seen by orthopedic surgery on 03/28/2021 due to exposed bones.  He was initially started on broad-spectrum antimicrobials. His blood cultures at the acute facility showed strep anginosis and he was de-escalated to IV ceftriaxone.  He was later escalated to IV Zosyn.  His hospital course complicated by AKI.  His wound cultures showed Pseudomonas which was resistant to Zosyn.  He has been on treatment with IV meropenem.  Due to his complex medical problems he was transferred to [place].  He remains on treatment with meropenem.  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  Remains on  treatment with IV meropenem.  Unfortunately has exposure of the bone.  The Pseudomonas was resistant to Zosyn and cephalosporins.  Recommend to continue treatment with IV meropenem.  Please monitor CBC, BUN/creatinine closely while on antibiotics.  Unfortunately antibiotic alone is not going to help as this may never heal and he might need Girdlestone/flap/graft procedure.  He may need to be transferred to a specialized center.   Stage IV sacral pressure ulcer/right trochanteric/right gluteal stage IV pressure ulcer: Continue local wound care.  Antibiotics as mentioned above.  If worsening consider consulting surgery.  Unfortunately due to his paraplegia and inability to offload this is unlikely to heal.  He will need diverting ostomy. -He is high risk for recurrent sepsis and bacteremia.  If he starts having fevers greater than 101 or worsening leukocytosis recommend to send for pancultures.   Bacteremia: He had bacteremia with strep anginosis at the acute facility and was treated with ceftriaxone.  Repeat blood cultures were negative.  Currently on IV meropenem as mentioned above.   Paraplegia: Due to his paraplegia and immobility he is high risk for worsening of his pressure ulcers.   Acute kidney injury: Resolved.  Continue to monitor BUN/creatinine closely while on antibiotic.   Chronic suprapubic catheter: Due to this he is high risk for recurrent UTI.  On antibiotics as mentioned above.   Anemia/severe protein calorie malnutrition: Continue to monitor hemoglobin.  Management of protein calorie malnutrition per the primary team. Unfortunately due to his complex medical problems he is high risk for worsening and decompensation.   Plan of care discussed with patient, primary team and pharmacy.    Subjective: On  off low-grade fevers, tachycardic. ° °Objective: °Vitals: Temperature 98.1, pulse 115, respiratory 24, blood pressure 110/66, pulse oximetry 94% ° °Examination: °Constitutional:  Chronically ill-appearing male, awake °Head: Atraumatic, normocephalic °Eyes: PERLA, EOMI, irises appear normal, anicteric sclera,  °ENMT: external ears and nose appear normal, normal hearing, moist oral mucosa             °Neck: Supple °CVS: S1-S2  °Respiratory: Decreased breath sounds lower lobes, no rhonchi or wheezing °Abdomen: soft nontender, nondistended, normal bowel sounds °Musculoskeletal: Paraplegic °Neuro: Paraplegic °Psych: stable mood and affect, mental status °Skin: Sacral and hip wounds, right hip bone sticking through the wound with extensive loss of tissue, dressing in place ° °Data Reviewed: I have personally reviewed following labs and imaging studies ° °CBC: °Recent Labs  °Lab 04/06/21 °1328 04/08/21 °0410 04/09/21 °0432  °WBC 5.1 6.3 6.0  °HGB 7.5* 7.0* 8.5*  °HCT 24.6* 22.8* 27.5*  °MCV 80.4 79.7* 81.8  °PLT 461* 454* 534*  ° ° °Basic Metabolic Panel: °Recent Labs  °Lab 04/06/21 °1328 04/07/21 °0437 04/09/21 °0432 04/10/21 °0539 04/12/21 °0444  °NA 130*  --  131*  --  135  °K 3.8  --  4.1  --  4.1  °CL 99  --  100  --  104  °CO2 25  --  26  --  25  °GLUCOSE 114*  --  113*  --  110*  °BUN 9  --  11  --  14  °CREATININE 0.51*  --  0.31*  --  0.32*  °CALCIUM 7.9*  --  7.8*  --  8.0*  °MG  --  2.1 2.3 2.3  --   °PHOS  --  3.1 3.6 3.6  --   ° ° °GFR: °Estimated Creatinine Clearance: 79 mL/min (A) (by C-G formula based on SCr of 0.32 mg/dL (L)). ° °Liver Function Tests: °Recent Labs  °Lab 04/09/21 °0432  °AST 89*  °ALT 123*  °ALKPHOS 136*  °BILITOT 0.2*  °PROT 6.2*  °ALBUMIN 1.5*  ° ° °CBG: °No results for input(s): GLUCAP in the last 168 hours. ° ° °Recent Results (from the past 240 hour(s))  °Urine Culture     Status: None  ° Collection Time: 04/03/21  3:25 PM  ° Specimen: Urine, Clean Catch  °Result Value Ref Range Status  ° Specimen Description URINE, CLEAN CATCH  Final  ° Special Requests NONE  Final  ° Culture   Final  °  NO GROWTH °Performed at [facility], [address]  [place], [place] [address] °  ° Report Status 04/04/2021 FINAL  Final  °Culture, blood (routine x 2)     Status: None  ° Collection Time: 04/04/21  6:18 PM  ° Specimen: BLOOD  °Result Value Ref Range Status  ° Specimen Description BLOOD SITE NOT SPECIFIED  Final  ° Special Requests   Final  °  BOTTLES DRAWN AEROBIC AND ANAEROBIC Blood Culture adequate volume  ° Culture   Final  °  NO GROWTH 5 DAYS °Performed at [facility], [address] [place], [place] [address] °  ° Report Status 04/09/2021 FINAL  Final  °Culture, blood (routine x 2)     Status: None  ° Collection Time: 04/04/21  6:18 PM  ° Specimen: BLOOD  °Result Value Ref Range Status  ° Specimen Description BLOOD SITE NOT SPECIFIED  Final  ° Special Requests   Final  °  BOTTLES DRAWN AEROBIC ONLY Blood Culture results may not be optimal   optimal due to an inadequate volume of blood received in culture bottles   Culture   Final    NO GROWTH 5 DAYS Performed at [facility], [place] [address], [place], [place] [address]    Report Status 04/09/2021 FINAL  Final     Radiology Studies: No results found.  Scheduled Meds: Please see MAR   Yaakov Guthrie, MD  04/12/2021, 6:30 PM

## 2021-04-13 DIAGNOSIS — M8669 Other chronic osteomyelitis, multiple sites: Secondary | ICD-10-CM | POA: Diagnosis not present

## 2021-04-13 DIAGNOSIS — G8221 Paraplegia, complete: Secondary | ICD-10-CM | POA: Diagnosis not present

## 2021-04-13 DIAGNOSIS — R509 Fever, unspecified: Secondary | ICD-10-CM | POA: Diagnosis not present

## 2021-04-13 DIAGNOSIS — E43 Unspecified severe protein-calorie malnutrition: Secondary | ICD-10-CM | POA: Diagnosis not present

## 2021-04-13 LAB — MAGNESIUM: Magnesium: 2.3 mg/dL (ref 1.7–2.4)

## 2021-04-13 LAB — PHOSPHORUS: Phosphorus: 3.7 mg/dL (ref 2.5–4.6)

## 2021-04-14 ENCOUNTER — Other Ambulatory Visit (HOSPITAL_COMMUNITY): Payer: Self-pay

## 2021-04-14 DIAGNOSIS — R509 Fever, unspecified: Secondary | ICD-10-CM | POA: Diagnosis not present

## 2021-04-14 DIAGNOSIS — Z981 Arthrodesis status: Secondary | ICD-10-CM | POA: Diagnosis not present

## 2021-04-14 DIAGNOSIS — Z452 Encounter for adjustment and management of vascular access device: Secondary | ICD-10-CM | POA: Diagnosis not present

## 2021-04-14 DIAGNOSIS — G8221 Paraplegia, complete: Secondary | ICD-10-CM | POA: Diagnosis not present

## 2021-04-14 DIAGNOSIS — M8669 Other chronic osteomyelitis, multiple sites: Secondary | ICD-10-CM | POA: Diagnosis not present

## 2021-04-14 DIAGNOSIS — E43 Unspecified severe protein-calorie malnutrition: Secondary | ICD-10-CM | POA: Diagnosis not present

## 2021-04-15 DIAGNOSIS — M8669 Other chronic osteomyelitis, multiple sites: Secondary | ICD-10-CM | POA: Diagnosis not present

## 2021-04-15 DIAGNOSIS — E43 Unspecified severe protein-calorie malnutrition: Secondary | ICD-10-CM | POA: Diagnosis not present

## 2021-04-15 DIAGNOSIS — R509 Fever, unspecified: Secondary | ICD-10-CM | POA: Diagnosis not present

## 2021-04-15 DIAGNOSIS — G8221 Paraplegia, complete: Secondary | ICD-10-CM | POA: Diagnosis not present

## 2021-04-16 DIAGNOSIS — R509 Fever, unspecified: Secondary | ICD-10-CM | POA: Diagnosis not present

## 2021-04-16 DIAGNOSIS — M8669 Other chronic osteomyelitis, multiple sites: Secondary | ICD-10-CM | POA: Diagnosis not present

## 2021-04-16 DIAGNOSIS — E43 Unspecified severe protein-calorie malnutrition: Secondary | ICD-10-CM | POA: Diagnosis not present

## 2021-04-16 DIAGNOSIS — G8221 Paraplegia, complete: Secondary | ICD-10-CM | POA: Diagnosis not present

## 2021-04-16 LAB — PHOSPHORUS: Phosphorus: 3.7 mg/dL (ref 2.5–4.6)

## 2021-04-16 LAB — MAGNESIUM: Magnesium: 2 mg/dL (ref 1.7–2.4)

## 2021-04-17 DIAGNOSIS — G8221 Paraplegia, complete: Secondary | ICD-10-CM | POA: Diagnosis not present

## 2021-04-17 DIAGNOSIS — R509 Fever, unspecified: Secondary | ICD-10-CM | POA: Diagnosis not present

## 2021-04-17 DIAGNOSIS — E43 Unspecified severe protein-calorie malnutrition: Secondary | ICD-10-CM | POA: Diagnosis not present

## 2021-04-17 DIAGNOSIS — M8669 Other chronic osteomyelitis, multiple sites: Secondary | ICD-10-CM | POA: Diagnosis not present

## 2021-04-18 DIAGNOSIS — I96 Gangrene, not elsewhere classified: Secondary | ICD-10-CM | POA: Diagnosis not present

## 2021-04-18 DIAGNOSIS — L89150 Pressure ulcer of sacral region, unstageable: Secondary | ICD-10-CM | POA: Diagnosis not present

## 2021-04-18 LAB — TRIGLYCERIDES: Triglycerides: 70 mg/dL (ref <150)

## 2021-04-19 LAB — MAGNESIUM: Magnesium: 2.1 mg/dL (ref 1.7–2.4)

## 2021-04-19 LAB — PHOSPHORUS: Phosphorus: 4.1 mg/dL (ref 2.5–4.6)

## 2021-04-20 ENCOUNTER — Other Ambulatory Visit (HOSPITAL_COMMUNITY): Payer: Self-pay

## 2021-04-20 DIAGNOSIS — L89154 Pressure ulcer of sacral region, stage 4: Secondary | ICD-10-CM | POA: Diagnosis not present

## 2021-04-20 DIAGNOSIS — D649 Anemia, unspecified: Secondary | ICD-10-CM | POA: Diagnosis not present

## 2021-04-20 DIAGNOSIS — E871 Hypo-osmolality and hyponatremia: Secondary | ICD-10-CM | POA: Diagnosis not present

## 2021-04-20 DIAGNOSIS — M86659 Other chronic osteomyelitis, unspecified thigh: Secondary | ICD-10-CM | POA: Diagnosis not present

## 2021-04-20 DIAGNOSIS — E43 Unspecified severe protein-calorie malnutrition: Secondary | ICD-10-CM | POA: Diagnosis not present

## 2021-04-20 DIAGNOSIS — M86551 Other chronic hematogenous osteomyelitis, right femur: Secondary | ICD-10-CM | POA: Diagnosis not present

## 2021-04-20 DIAGNOSIS — K5900 Constipation, unspecified: Secondary | ICD-10-CM | POA: Diagnosis not present

## 2021-04-20 DIAGNOSIS — N179 Acute kidney failure, unspecified: Secondary | ICD-10-CM | POA: Diagnosis not present

## 2021-04-20 DIAGNOSIS — G8220 Paraplegia, unspecified: Secondary | ICD-10-CM | POA: Diagnosis not present

## 2021-04-20 LAB — COMPREHENSIVE METABOLIC PANEL
ALT: 72 U/L — ABNORMAL HIGH (ref 0–44)
AST: 34 U/L (ref 15–41)
Albumin: 1.7 g/dL — ABNORMAL LOW (ref 3.5–5.0)
Alkaline Phosphatase: 139 U/L — ABNORMAL HIGH (ref 38–126)
Anion gap: 6 (ref 5–15)
BUN: 12 mg/dL (ref 6–20)
CO2: 23 mmol/L (ref 22–32)
Calcium: 7.8 mg/dL — ABNORMAL LOW (ref 8.9–10.3)
Chloride: 104 mmol/L (ref 98–111)
Creatinine, Ser: 0.45 mg/dL — ABNORMAL LOW (ref 0.61–1.24)
GFR, Estimated: >60 mL/min (ref >60)
Glucose, Bld: 90 mg/dL (ref 70–99)
Potassium: 3.9 mmol/L (ref 3.5–5.1)
Sodium: 133 mmol/L — ABNORMAL LOW (ref 135–145)
Total Bilirubin: 0.5 mg/dL (ref 0.3–1.2)
Total Protein: 6.4 g/dL — ABNORMAL LOW (ref 6.5–8.1)

## 2021-04-20 LAB — CBC
HCT: 27.2 % — ABNORMAL LOW (ref 39.0–52.0)
Hemoglobin: 8.2 g/dL — ABNORMAL LOW (ref 13.0–17.0)
MCH: 24.8 pg — ABNORMAL LOW (ref 26.0–34.0)
MCHC: 30.1 g/dL (ref 30.0–36.0)
MCV: 82.4 fL (ref 80.0–100.0)
Platelets: 388 10*3/uL (ref 150–400)
RBC: 3.3 MIL/uL — ABNORMAL LOW (ref 4.22–5.81)
RDW: 21.2 % — ABNORMAL HIGH (ref 11.5–15.5)
WBC: 5.9 10*3/uL (ref 4.0–10.5)
nRBC: 0 % (ref 0.0–0.2)

## 2021-04-20 NOTE — Progress Notes (Addendum)
°PROGRESS NOTE ° ° ° °Jonathon York  MRN:[id number] DOB: 10/21/1975 DOA: 04/02/2021 ° °Brief Narrative:  °Jonathon York is an 45 y.o. male with medical history significant of paraplegia for the last 4 years, chronic sacral decubitus ulcers, neurogenic bladder with suprapubic catheter, osteomyelitis of the sacrum.  Patient apparently fell at home and could not get himself.  He was found on the ground by the family about 4 days after.  He apparently drank his own urine during this period.  He was found to be covered in stool.  He presented to [facility] on 03/21/2021.  At the time of admission he met sepsis criteria with worsening of the chronic sacral pressure ulcers.  He was initially admitted to the ICU service and was placed on pressors for septic shock.  03/24/2021 he underwent excisional debridement of the sacral and bilateral gluteal wounds by general surgery.  He was also seen by orthopedic surgery on 03/28/2021 due to exposed bones.  He was initially started on broad-spectrum antimicrobials. His blood cultures at the acute facility showed strep anginosis and he was de-escalated to IV ceftriaxone.  He was later escalated to IV Zosyn.  His hospital course complicated by AKI.  His wound cultures showed Pseudomonas which was resistant to Zosyn.  He has been on treatment with IV meropenem.  Due to his complex medical problems he was transferred to [facility].  He remains on treatment with meropenem. ° °Assessment & Plan: °  °Active Problems: ° Right hip joint chronic osteomyelitis/septic arthritis secondary to Pseudomonas °Stage IV sacrococcygeal pressure ulcer °Right trochanteric/right gluteal stage IV pressure ulcer °Bacteremia with strep anginosis, now resolved °Paraplegia °Acute kidney injury °Chronic suprapubic catheter °Anemia °Severe protein calorie malnutrition °  °Right hip joint chronic osteomyelitis/septic blood cultures at the acute facility that showed Pseudomonas.  Remains on  treatment with IV meropenem.  Unfortunately has exposure of the bone.  The Pseudomonas was resistant to Zosyn and cephalosporins.  Recommend to continue treatment with IV meropenem.  Please monitor CBC, BUN/creatinine closely while on antibiotics.  Unfortunately antibiotic alone is not going to help as this may never heal and he might need Girdlestone/flap/graft procedure.  He may need to be transferred to a specialized center. °  °Stage IV sacral pressure ulcer/right trochanteric/right gluteal stage IV pressure ulcer: Continue local wound care.  Antibiotics as mentioned above.  Images reviewed.  If worsening, consider consulting surgery.  Unfortunately due to his paraplegia and inability to offload this is unlikely to heal.  He will need diverting ostomy. °-He is high risk for recurrent sepsis and bacteremia.  If he starts having fevers greater than 101 or worsening leukocytosis recommend to send for pancultures. °  °Bacteremia: He had bacteremia with strep anginosis at the acute facility and was treated with ceftriaxone.  Repeat blood cultures were negative.  Currently on IV meropenem as mentioned above.  He is however, high risk for recurrent sepsis and bacteremia.  If he starts having fever greater than 101 or worsening leukocytosis recommend to send for pancultures including blood cultures. °  °Paraplegia: Due to his paraplegia and immobility he is high risk for worsening of his pressure ulcers. °  °Acute kidney injury: Resolved.  Continue to monitor BUN/creatinine closely while on antibiotic. °  °Chronic suprapubic catheter: Due to this he is high risk for recurrent UTI.  On antibiotics as mentioned above. °  °Anemia/severe protein calorie malnutrition: Continue to monitor hemoglobin.  Management of protein calorie malnutrition per the primary team. °  team. Unfortunately due to his complex medical problems he is high risk for worsening and decompensation.   Plan of care discussed with primary team and pharmacy.     Subjective: Denies having any complaints.  No acute events reported.  Remains on treatment with IV meropenem.  Objective: Vitals: Temperature 97.2, pulse 105, respiratory 26, blood pressure 129/72, oxygen saturation 98%  Examination: Constitutional: awake, not in any acute distress at this time Head: Atraumatic, normocephalic Eyes: PERLA, EOMI, irises appear normal, anicteric sclera,  ENMT: external ears and nose appear normal, normal hearing, moist oral mucosa             Neck: Supple CVS: S1-S2  Respiratory: Decreased breath sounds lower lobes, no rhonchi or wheezing Abdomen: soft nontender, nondistended, normal bowel sounds Musculoskeletal: Paraplegic Neuro: Paraplegic Psych: stable mood and affect, mental status Skin: Sacral and hip wounds, right hip bone sticking through the wound with extensive loss of tissue, dressing in place  Data Reviewed: I have personally reviewed following labs and imaging studies  CBC: Recent Labs  Lab 04/20/21 0405  WBC 5.9  HGB 8.2*  HCT 27.2*  MCV 82.4  PLT 388     Basic Metabolic Panel: Recent Labs  Lab 04/16/21 0620 04/19/21 0558 04/20/21 0405  NA  --   --  133*  K  --   --  3.9  CL  --   --  104  CO2  --   --  23  GLUCOSE  --   --  90  BUN  --   --  12  CREATININE  --   --  0.45*  CALCIUM  --   --  7.8*  MG 2.0 2.1  --   PHOS 3.7 4.1  --      GFR: CrCl cannot be calculated (Unknown ideal weight.).  Liver Function Tests: Recent Labs  Lab 04/20/21 0405  AST 34  ALT 72*  ALKPHOS 139*  BILITOT 0.5  PROT 6.4*  ALBUMIN 1.7*     CBG: No results for input(s): GLUCAP in the last 168 hours.   No results found for this or any previous visit (from the past 240 hour(s)).    Radiology Studies: DG Abd Portable 1V  Result Date: 04/20/2021 CLINICAL DATA:  Question constipation EXAM: PORTABLE ABDOMEN - 1 VIEW COMPARISON:  CT 08/31/2020 FINDINGS: Moderate amount of gas and fecal matter in the colon, but within the  range of normal. No evidence of small-bowel obstruction. 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 This was not present in May of this year. IMPRESSION: Moderate amount of gas and fecal matter in the colon, but not out of the range of normal. 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 This was not present in May of this year however. Electronically Signed   By: Nelson Chimes M.D.   On: 04/20/2021 12:11    Scheduled Meds: Please see MAR   Yaakov Guthrie, MD  04/20/2021, 11:08 PM

## 2021-04-21 DIAGNOSIS — E43 Unspecified severe protein-calorie malnutrition: Secondary | ICD-10-CM | POA: Diagnosis not present

## 2021-04-21 DIAGNOSIS — D649 Anemia, unspecified: Secondary | ICD-10-CM | POA: Diagnosis not present

## 2021-04-21 DIAGNOSIS — E871 Hypo-osmolality and hyponatremia: Secondary | ICD-10-CM | POA: Diagnosis not present

## 2021-04-21 DIAGNOSIS — M86659 Other chronic osteomyelitis, unspecified thigh: Secondary | ICD-10-CM | POA: Diagnosis not present

## 2021-04-22 DIAGNOSIS — D649 Anemia, unspecified: Secondary | ICD-10-CM | POA: Diagnosis not present

## 2021-04-22 DIAGNOSIS — K5900 Constipation, unspecified: Secondary | ICD-10-CM | POA: Diagnosis not present

## 2021-04-22 DIAGNOSIS — M86652 Other chronic osteomyelitis, left thigh: Secondary | ICD-10-CM | POA: Diagnosis not present

## 2021-04-22 DIAGNOSIS — G8220 Paraplegia, unspecified: Secondary | ICD-10-CM | POA: Diagnosis not present

## 2021-04-22 LAB — PHOSPHORUS: Phosphorus: 3.9 mg/dL (ref 2.5–4.6)

## 2021-04-22 LAB — MAGNESIUM: Magnesium: 2.1 mg/dL (ref 1.7–2.4)

## 2021-04-23 DIAGNOSIS — N179 Acute kidney failure, unspecified: Secondary | ICD-10-CM | POA: Diagnosis not present

## 2021-04-23 DIAGNOSIS — K219 Gastro-esophageal reflux disease without esophagitis: Secondary | ICD-10-CM | POA: Diagnosis not present

## 2021-04-23 DIAGNOSIS — G8220 Paraplegia, unspecified: Secondary | ICD-10-CM | POA: Diagnosis not present

## 2021-04-23 DIAGNOSIS — M869 Osteomyelitis, unspecified: Secondary | ICD-10-CM | POA: Diagnosis not present

## 2021-04-24 DIAGNOSIS — D649 Anemia, unspecified: Secondary | ICD-10-CM | POA: Diagnosis not present

## 2021-04-24 DIAGNOSIS — G8220 Paraplegia, unspecified: Secondary | ICD-10-CM | POA: Diagnosis not present

## 2021-04-24 DIAGNOSIS — M869 Osteomyelitis, unspecified: Secondary | ICD-10-CM | POA: Diagnosis not present

## 2021-04-24 DIAGNOSIS — N179 Acute kidney failure, unspecified: Secondary | ICD-10-CM | POA: Diagnosis not present

## 2021-04-24 LAB — CBC
HCT: 23.7 % — ABNORMAL LOW (ref 39.0–52.0)
Hemoglobin: 7.1 g/dL — ABNORMAL LOW (ref 13.0–17.0)
MCH: 24.4 pg — ABNORMAL LOW (ref 26.0–34.0)
MCHC: 30 g/dL (ref 30.0–36.0)
MCV: 81.4 fL (ref 80.0–100.0)
Platelets: 315 10*3/uL (ref 150–400)
RBC: 2.91 MIL/uL — ABNORMAL LOW (ref 4.22–5.81)
RDW: 20 % — ABNORMAL HIGH (ref 11.5–15.5)
WBC: 4.4 10*3/uL (ref 4.0–10.5)
nRBC: 0 % (ref 0.0–0.2)

## 2021-04-24 LAB — BASIC METABOLIC PANEL
Anion gap: 7 (ref 5–15)
BUN: 7 mg/dL (ref 6–20)
CO2: 22 mmol/L (ref 22–32)
Calcium: 7.7 mg/dL — ABNORMAL LOW (ref 8.9–10.3)
Chloride: 108 mmol/L (ref 98–111)
Creatinine, Ser: 0.31 mg/dL — ABNORMAL LOW (ref 0.61–1.24)
GFR, Estimated: >60 mL/min (ref >60)
Glucose, Bld: 101 mg/dL — ABNORMAL HIGH (ref 70–99)
Potassium: 3.7 mmol/L (ref 3.5–5.1)
Sodium: 137 mmol/L (ref 135–145)

## 2021-04-25 DIAGNOSIS — N179 Acute kidney failure, unspecified: Secondary | ICD-10-CM | POA: Diagnosis not present

## 2021-04-25 DIAGNOSIS — D649 Anemia, unspecified: Secondary | ICD-10-CM | POA: Diagnosis not present

## 2021-04-25 DIAGNOSIS — M869 Osteomyelitis, unspecified: Secondary | ICD-10-CM | POA: Diagnosis not present

## 2021-04-25 DIAGNOSIS — G8220 Paraplegia, unspecified: Secondary | ICD-10-CM | POA: Diagnosis not present

## 2021-04-25 LAB — TRIGLYCERIDES: Triglycerides: 69 mg/dL (ref <150)

## 2021-04-26 DIAGNOSIS — G8221 Paraplegia, complete: Secondary | ICD-10-CM | POA: Diagnosis not present

## 2021-04-26 DIAGNOSIS — R509 Fever, unspecified: Secondary | ICD-10-CM | POA: Diagnosis not present

## 2021-04-26 DIAGNOSIS — M8669 Other chronic osteomyelitis, multiple sites: Secondary | ICD-10-CM | POA: Diagnosis not present

## 2021-04-26 DIAGNOSIS — E43 Unspecified severe protein-calorie malnutrition: Secondary | ICD-10-CM | POA: Diagnosis not present

## 2021-04-26 LAB — CBC
HCT: 25.7 % — ABNORMAL LOW (ref 39.0–52.0)
Hemoglobin: 7.9 g/dL — ABNORMAL LOW (ref 13.0–17.0)
MCH: 24.9 pg — ABNORMAL LOW (ref 26.0–34.0)
MCHC: 30.7 g/dL (ref 30.0–36.0)
MCV: 81.1 fL (ref 80.0–100.0)
Platelets: 357 10*3/uL (ref 150–400)
RBC: 3.17 MIL/uL — ABNORMAL LOW (ref 4.22–5.81)
RDW: 19.3 % — ABNORMAL HIGH (ref 11.5–15.5)
WBC: 4.1 10*3/uL (ref 4.0–10.5)
nRBC: 0 % (ref 0.0–0.2)

## 2021-04-27 ENCOUNTER — Other Ambulatory Visit (HOSPITAL_COMMUNITY): Payer: Self-pay

## 2021-04-27 DIAGNOSIS — G8221 Paraplegia, complete: Secondary | ICD-10-CM | POA: Diagnosis not present

## 2021-04-27 DIAGNOSIS — R590 Localized enlarged lymph nodes: Secondary | ICD-10-CM | POA: Diagnosis not present

## 2021-04-27 DIAGNOSIS — E43 Unspecified severe protein-calorie malnutrition: Secondary | ICD-10-CM | POA: Diagnosis not present

## 2021-04-27 DIAGNOSIS — I878 Other specified disorders of veins: Secondary | ICD-10-CM | POA: Diagnosis not present

## 2021-04-27 DIAGNOSIS — M8669 Other chronic osteomyelitis, multiple sites: Secondary | ICD-10-CM | POA: Diagnosis not present

## 2021-04-27 DIAGNOSIS — S73004A Unspecified dislocation of right hip, initial encounter: Secondary | ICD-10-CM | POA: Diagnosis not present

## 2021-04-27 DIAGNOSIS — R509 Fever, unspecified: Secondary | ICD-10-CM | POA: Diagnosis not present

## 2021-04-27 DIAGNOSIS — L89219 Pressure ulcer of right hip, unspecified stage: Secondary | ICD-10-CM | POA: Diagnosis not present

## 2021-04-27 LAB — COMPREHENSIVE METABOLIC PANEL
ALT: 23 U/L (ref 0–44)
AST: 14 U/L — ABNORMAL LOW (ref 15–41)
Albumin: 1.7 g/dL — ABNORMAL LOW (ref 3.5–5.0)
Alkaline Phosphatase: 84 U/L (ref 38–126)
Anion gap: 7 (ref 5–15)
BUN: 11 mg/dL (ref 6–20)
CO2: 21 mmol/L — ABNORMAL LOW (ref 22–32)
Calcium: 7.7 mg/dL — ABNORMAL LOW (ref 8.9–10.3)
Chloride: 105 mmol/L (ref 98–111)
Creatinine, Ser: 0.41 mg/dL — ABNORMAL LOW (ref 0.61–1.24)
GFR, Estimated: >60 mL/min (ref >60)
Glucose, Bld: 110 mg/dL — ABNORMAL HIGH (ref 70–99)
Potassium: 3.9 mmol/L (ref 3.5–5.1)
Sodium: 133 mmol/L — ABNORMAL LOW (ref 135–145)
Total Bilirubin: 0.3 mg/dL (ref 0.3–1.2)
Total Protein: 6.1 g/dL — ABNORMAL LOW (ref 6.5–8.1)

## 2021-04-27 LAB — PHOSPHORUS: Phosphorus: 3.9 mg/dL (ref 2.5–4.6)

## 2021-04-27 LAB — MAGNESIUM: Magnesium: 1.9 mg/dL (ref 1.7–2.4)

## 2021-04-27 MED ORDER — IOHEXOL 300 MG/ML  SOLN
100.0000 mL | Freq: Once | INTRAMUSCULAR | Status: AC | PRN
  Administered 2021-04-27: 16:00:00 100 mL via INTRAVENOUS

## 2021-04-28 DIAGNOSIS — E43 Unspecified severe protein-calorie malnutrition: Secondary | ICD-10-CM | POA: Diagnosis not present

## 2021-04-28 DIAGNOSIS — M8669 Other chronic osteomyelitis, multiple sites: Secondary | ICD-10-CM | POA: Diagnosis not present

## 2021-04-28 DIAGNOSIS — R509 Fever, unspecified: Secondary | ICD-10-CM | POA: Diagnosis not present

## 2021-04-28 DIAGNOSIS — G8221 Paraplegia, complete: Secondary | ICD-10-CM | POA: Diagnosis not present

## 2021-04-30 LAB — COMPREHENSIVE METABOLIC PANEL
ALT: 45 U/L — ABNORMAL HIGH (ref 0–44)
AST: 44 U/L — ABNORMAL HIGH (ref 15–41)
Albumin: 1.7 g/dL — ABNORMAL LOW (ref 3.5–5.0)
Alkaline Phosphatase: 93 U/L (ref 38–126)
Anion gap: 9 (ref 5–15)
BUN: 12 mg/dL (ref 6–20)
CO2: 24 mmol/L (ref 22–32)
Calcium: 8.2 mg/dL — ABNORMAL LOW (ref 8.9–10.3)
Chloride: 103 mmol/L (ref 98–111)
Creatinine, Ser: 0.45 mg/dL — ABNORMAL LOW (ref 0.61–1.24)
GFR, Estimated: >60 mL/min (ref >60)
Glucose, Bld: 104 mg/dL — ABNORMAL HIGH (ref 70–99)
Potassium: 4 mmol/L (ref 3.5–5.1)
Sodium: 136 mmol/L (ref 135–145)
Total Bilirubin: 0.3 mg/dL (ref 0.3–1.2)
Total Protein: 6.2 g/dL — ABNORMAL LOW (ref 6.5–8.1)

## 2021-04-30 LAB — PHOSPHORUS: Phosphorus: 3.9 mg/dL (ref 2.5–4.6)

## 2021-04-30 LAB — MAGNESIUM: Magnesium: 2 mg/dL (ref 1.7–2.4)

## 2021-05-01 LAB — BASIC METABOLIC PANEL
Anion gap: 6 (ref 5–15)
BUN: 12 mg/dL (ref 6–20)
CO2: 26 mmol/L (ref 22–32)
Calcium: 8 mg/dL — ABNORMAL LOW (ref 8.9–10.3)
Chloride: 104 mmol/L (ref 98–111)
Creatinine, Ser: 0.36 mg/dL — ABNORMAL LOW (ref 0.61–1.24)
GFR, Estimated: >60 mL/min (ref >60)
Glucose, Bld: 103 mg/dL — ABNORMAL HIGH (ref 70–99)
Potassium: 4 mmol/L (ref 3.5–5.1)
Sodium: 136 mmol/L (ref 135–145)

## 2021-05-03 LAB — COMPREHENSIVE METABOLIC PANEL
ALT: 37 U/L (ref 0–44)
AST: 23 U/L (ref 15–41)
Albumin: 1.6 g/dL — ABNORMAL LOW (ref 3.5–5.0)
Alkaline Phosphatase: 103 U/L (ref 38–126)
Anion gap: 5 (ref 5–15)
BUN: 12 mg/dL (ref 6–20)
CO2: 26 mmol/L (ref 22–32)
Calcium: 8 mg/dL — ABNORMAL LOW (ref 8.9–10.3)
Chloride: 105 mmol/L (ref 98–111)
Creatinine, Ser: 0.33 mg/dL — ABNORMAL LOW (ref 0.61–1.24)
GFR, Estimated: >60 mL/min (ref >60)
Glucose, Bld: 130 mg/dL — ABNORMAL HIGH (ref 70–99)
Potassium: 3.9 mmol/L (ref 3.5–5.1)
Sodium: 136 mmol/L (ref 135–145)
Total Bilirubin: 0.2 mg/dL — ABNORMAL LOW (ref 0.3–1.2)
Total Protein: 6.3 g/dL — ABNORMAL LOW (ref 6.5–8.1)

## 2021-05-03 LAB — PHOSPHORUS: Phosphorus: 3.5 mg/dL (ref 2.5–4.6)

## 2021-05-03 LAB — MAGNESIUM: Magnesium: 2.1 mg/dL (ref 1.7–2.4)

## 2021-05-04 ENCOUNTER — Other Ambulatory Visit (HOSPITAL_COMMUNITY): Payer: Self-pay

## 2021-05-04 DIAGNOSIS — Z452 Encounter for adjustment and management of vascular access device: Secondary | ICD-10-CM | POA: Diagnosis not present

## 2021-05-04 DIAGNOSIS — Z981 Arthrodesis status: Secondary | ICD-10-CM | POA: Diagnosis not present

## 2021-05-07 LAB — CBC
HCT: 26.5 % — ABNORMAL LOW (ref 39.0–52.0)
Hemoglobin: 8 g/dL — ABNORMAL LOW (ref 13.0–17.0)
MCH: 24.3 pg — ABNORMAL LOW (ref 26.0–34.0)
MCHC: 30.2 g/dL (ref 30.0–36.0)
MCV: 80.5 fL (ref 80.0–100.0)
Platelets: 350 10*3/uL (ref 150–400)
RBC: 3.29 MIL/uL — ABNORMAL LOW (ref 4.22–5.81)
RDW: 18.1 % — ABNORMAL HIGH (ref 11.5–15.5)
WBC: 6.2 10*3/uL (ref 4.0–10.5)
nRBC: 0 % (ref 0.0–0.2)

## 2021-05-07 LAB — BASIC METABOLIC PANEL
Anion gap: 7 (ref 5–15)
BUN: 10 mg/dL (ref 6–20)
CO2: 21 mmol/L — ABNORMAL LOW (ref 22–32)
Calcium: 7.6 mg/dL — ABNORMAL LOW (ref 8.9–10.3)
Chloride: 108 mmol/L (ref 98–111)
Creatinine, Ser: 0.58 mg/dL — ABNORMAL LOW (ref 0.61–1.24)
GFR, Estimated: >60 mL/min (ref >60)
Glucose, Bld: 118 mg/dL — ABNORMAL HIGH (ref 70–99)
Potassium: 3.8 mmol/L (ref 3.5–5.1)
Sodium: 136 mmol/L (ref 135–145)

## 2021-05-07 LAB — MAGNESIUM: Magnesium: 1.9 mg/dL (ref 1.7–2.4)

## 2021-05-08 ENCOUNTER — Other Ambulatory Visit (HOSPITAL_COMMUNITY): Payer: Self-pay

## 2021-05-08 DIAGNOSIS — Z452 Encounter for adjustment and management of vascular access device: Secondary | ICD-10-CM | POA: Diagnosis not present

## 2021-05-08 LAB — BLOOD GAS, ARTERIAL
Acid-base deficit: 0.1 mmol/L (ref 0.0–2.0)
Bicarbonate: 23.5 mmol/L (ref 20.0–28.0)
FIO2: 21
O2 Saturation: 74.4 %
Patient temperature: 37
pCO2 arterial: 35 mmHg (ref 32.0–48.0)
pH, Arterial: 7.443 (ref 7.350–7.450)
pO2, Arterial: 43.7 mmHg — ABNORMAL LOW (ref 83.0–108.0)

## 2021-05-09 LAB — COMPREHENSIVE METABOLIC PANEL
ALT: 24 U/L (ref 0–44)
AST: 14 U/L — ABNORMAL LOW (ref 15–41)
Albumin: 1.6 g/dL — ABNORMAL LOW (ref 3.5–5.0)
Alkaline Phosphatase: 100 U/L (ref 38–126)
Anion gap: 6 (ref 5–15)
BUN: 13 mg/dL (ref 6–20)
CO2: 24 mmol/L (ref 22–32)
Calcium: 7.8 mg/dL — ABNORMAL LOW (ref 8.9–10.3)
Chloride: 105 mmol/L (ref 98–111)
Creatinine, Ser: 0.37 mg/dL — ABNORMAL LOW (ref 0.61–1.24)
GFR, Estimated: >60 mL/min (ref >60)
Glucose, Bld: 131 mg/dL — ABNORMAL HIGH (ref 70–99)
Potassium: 3.5 mmol/L (ref 3.5–5.1)
Sodium: 135 mmol/L (ref 135–145)
Total Bilirubin: 0.2 mg/dL — ABNORMAL LOW (ref 0.3–1.2)
Total Protein: 5.9 g/dL — ABNORMAL LOW (ref 6.5–8.1)

## 2021-05-09 LAB — MAGNESIUM: Magnesium: 1.9 mg/dL (ref 1.7–2.4)

## 2021-05-09 LAB — TRIGLYCERIDES: Triglycerides: 74 mg/dL (ref <150)

## 2021-05-09 LAB — PHOSPHORUS: Phosphorus: 4.5 mg/dL (ref 2.5–4.6)

## 2021-05-12 LAB — COMPREHENSIVE METABOLIC PANEL
ALT: 61 U/L — ABNORMAL HIGH (ref 0–44)
AST: 55 U/L — ABNORMAL HIGH (ref 15–41)
Albumin: 1.5 g/dL — ABNORMAL LOW (ref 3.5–5.0)
Alkaline Phosphatase: 110 U/L (ref 38–126)
Anion gap: 10 (ref 5–15)
BUN: 11 mg/dL (ref 6–20)
CO2: 23 mmol/L (ref 22–32)
Calcium: 8.1 mg/dL — ABNORMAL LOW (ref 8.9–10.3)
Chloride: 101 mmol/L (ref 98–111)
Creatinine, Ser: 0.47 mg/dL — ABNORMAL LOW (ref 0.61–1.24)
GFR, Estimated: >60 mL/min (ref >60)
Glucose, Bld: 141 mg/dL — ABNORMAL HIGH (ref 70–99)
Potassium: 3.8 mmol/L (ref 3.5–5.1)
Sodium: 134 mmol/L — ABNORMAL LOW (ref 135–145)
Total Bilirubin: 0.3 mg/dL (ref 0.3–1.2)
Total Protein: 6.2 g/dL — ABNORMAL LOW (ref 6.5–8.1)

## 2021-05-12 LAB — URINALYSIS, ROUTINE W REFLEX MICROSCOPIC
Bilirubin Urine: NEGATIVE
Glucose, UA: NEGATIVE mg/dL
Ketones, ur: NEGATIVE mg/dL
Nitrite: NEGATIVE
Protein, ur: NEGATIVE mg/dL
Specific Gravity, Urine: 1.02 (ref 1.005–1.030)
pH: 6 (ref 5.0–8.0)

## 2021-05-12 LAB — CBC
HCT: 24.9 % — ABNORMAL LOW (ref 39.0–52.0)
Hemoglobin: 7.3 g/dL — ABNORMAL LOW (ref 13.0–17.0)
MCH: 23.6 pg — ABNORMAL LOW (ref 26.0–34.0)
MCHC: 29.3 g/dL — ABNORMAL LOW (ref 30.0–36.0)
MCV: 80.6 fL (ref 80.0–100.0)
Platelets: 369 10*3/uL (ref 150–400)
RBC: 3.09 MIL/uL — ABNORMAL LOW (ref 4.22–5.81)
RDW: 17.7 % — ABNORMAL HIGH (ref 11.5–15.5)
WBC: 7.1 10*3/uL (ref 4.0–10.5)
nRBC: 0 % (ref 0.0–0.2)

## 2021-05-12 LAB — URINALYSIS, MICROSCOPIC (REFLEX)

## 2021-05-12 LAB — PHOSPHORUS: Phosphorus: 3.7 mg/dL (ref 2.5–4.6)

## 2021-05-12 LAB — MAGNESIUM: Magnesium: 1.8 mg/dL (ref 1.7–2.4)

## 2021-05-13 ENCOUNTER — Other Ambulatory Visit (HOSPITAL_COMMUNITY): Payer: Self-pay

## 2021-05-13 LAB — URINE CULTURE: Culture: >=100000 — AB

## 2021-05-13 MED ORDER — IOHEXOL 300 MG/ML  SOLN
100.0000 mL | Freq: Once | INTRAMUSCULAR | Status: AC | PRN
  Administered 2021-05-13: 100 mL via INTRAVENOUS

## 2021-05-15 LAB — CBC
HCT: 20.5 % — ABNORMAL LOW (ref 39.0–52.0)
Hemoglobin: 6.3 g/dL — CL (ref 13.0–17.0)
MCH: 24 pg — ABNORMAL LOW (ref 26.0–34.0)
MCHC: 30.7 g/dL (ref 30.0–36.0)
MCV: 78.2 fL — ABNORMAL LOW (ref 80.0–100.0)
Platelets: 324 10*3/uL (ref 150–400)
RBC: 2.62 MIL/uL — ABNORMAL LOW (ref 4.22–5.81)
RDW: 17.5 % — ABNORMAL HIGH (ref 11.5–15.5)
WBC: 6.8 10*3/uL (ref 4.0–10.5)
nRBC: 0 % (ref 0.0–0.2)

## 2021-05-15 LAB — PHOSPHORUS: Phosphorus: 3.9 mg/dL (ref 2.5–4.6)

## 2021-05-15 LAB — BASIC METABOLIC PANEL
Anion gap: 6 (ref 5–15)
BUN: 10 mg/dL (ref 6–20)
CO2: 24 mmol/L (ref 22–32)
Calcium: 7.9 mg/dL — ABNORMAL LOW (ref 8.9–10.3)
Chloride: 106 mmol/L (ref 98–111)
Creatinine, Ser: 0.4 mg/dL — ABNORMAL LOW (ref 0.61–1.24)
GFR, Estimated: >60 mL/min (ref >60)
Glucose, Bld: 134 mg/dL — ABNORMAL HIGH (ref 70–99)
Potassium: 3.7 mmol/L (ref 3.5–5.1)
Sodium: 136 mmol/L (ref 135–145)

## 2021-05-15 LAB — MAGNESIUM: Magnesium: 1.8 mg/dL (ref 1.7–2.4)

## 2021-05-15 LAB — PREPARE RBC (CROSSMATCH)

## 2021-05-15 LAB — HEMOGLOBIN AND HEMATOCRIT, BLOOD
HCT: 26.7 % — ABNORMAL LOW (ref 39.0–52.0)
Hemoglobin: 8.1 g/dL — ABNORMAL LOW (ref 13.0–17.0)

## 2021-05-16 LAB — CULTURE, BLOOD (ROUTINE X 2)
Culture: NO GROWTH
Special Requests: ADEQUATE

## 2021-05-16 LAB — VANCOMYCIN, TROUGH: Vancomycin Tr: 7 ug/mL — ABNORMAL LOW (ref 15–20)

## 2021-05-17 LAB — CULTURE, BLOOD (ROUTINE X 2)
Culture: NO GROWTH
Special Requests: ADEQUATE

## 2021-05-17 LAB — CBC
HCT: 22.3 % — ABNORMAL LOW (ref 39.0–52.0)
Hemoglobin: 6.8 g/dL — CL (ref 13.0–17.0)
MCH: 23.9 pg — ABNORMAL LOW (ref 26.0–34.0)
MCHC: 30.5 g/dL (ref 30.0–36.0)
MCV: 78.2 fL — ABNORMAL LOW (ref 80.0–100.0)
Platelets: 339 10*3/uL (ref 150–400)
RBC: 2.85 MIL/uL — ABNORMAL LOW (ref 4.22–5.81)
RDW: 17.8 % — ABNORMAL HIGH (ref 11.5–15.5)
WBC: 8.3 10*3/uL (ref 4.0–10.5)
nRBC: 0 % (ref 0.0–0.2)

## 2021-05-17 LAB — HEMOGLOBIN AND HEMATOCRIT, BLOOD
HCT: 25 % — ABNORMAL LOW (ref 39.0–52.0)
Hemoglobin: 7.8 g/dL — ABNORMAL LOW (ref 13.0–17.0)

## 2021-05-17 LAB — PREPARE RBC (CROSSMATCH)

## 2021-05-18 LAB — BPAM RBC
Blood Product Expiration Date: 202301202359
Blood Product Expiration Date: 202301212359
ISSUE DATE / TIME: 202301170940
ISSUE DATE / TIME: 202301190525
Unit Type and Rh: 5100
Unit Type and Rh: 5100

## 2021-05-18 LAB — TYPE AND SCREEN
ABO/RH(D): O POS
Antibody Screen: NEGATIVE
Unit division: 0
Unit division: 0

## 2021-05-18 LAB — CBC
HCT: 26.1 % — ABNORMAL LOW (ref 39.0–52.0)
Hemoglobin: 8.1 g/dL — ABNORMAL LOW (ref 13.0–17.0)
MCH: 24.3 pg — ABNORMAL LOW (ref 26.0–34.0)
MCHC: 31 g/dL (ref 30.0–36.0)
MCV: 78.4 fL — ABNORMAL LOW (ref 80.0–100.0)
Platelets: 344 10*3/uL (ref 150–400)
RBC: 3.33 MIL/uL — ABNORMAL LOW (ref 4.22–5.81)
RDW: 17.5 % — ABNORMAL HIGH (ref 11.5–15.5)
WBC: 8.9 10*3/uL (ref 4.0–10.5)
nRBC: 0 % (ref 0.0–0.2)

## 2021-05-18 LAB — PHOSPHORUS: Phosphorus: 3.7 mg/dL (ref 2.5–4.6)

## 2021-05-18 LAB — MAGNESIUM: Magnesium: 1.9 mg/dL (ref 1.7–2.4)

## 2021-05-19 LAB — CULTURE, BLOOD (ROUTINE X 2)
Culture: NO GROWTH
Culture: NO GROWTH
Special Requests: ADEQUATE
Special Requests: ADEQUATE

## 2021-05-19 LAB — HEMOGLOBIN AND HEMATOCRIT, BLOOD
HCT: 25.9 % — ABNORMAL LOW (ref 39.0–52.0)
Hemoglobin: 7.8 g/dL — ABNORMAL LOW (ref 13.0–17.0)

## 2021-05-21 LAB — PHOSPHORUS: Phosphorus: 4.6 mg/dL (ref 2.5–4.6)

## 2021-05-21 LAB — MAGNESIUM: Magnesium: 2 mg/dL (ref 1.7–2.4)

## 2021-05-22 ENCOUNTER — Encounter (HOSPITAL_BASED_OUTPATIENT_CLINIC_OR_DEPARTMENT_OTHER): Payer: Medicare HMO

## 2021-05-22 DIAGNOSIS — M79621 Pain in right upper arm: Secondary | ICD-10-CM | POA: Diagnosis not present

## 2021-05-22 DIAGNOSIS — M7989 Other specified soft tissue disorders: Secondary | ICD-10-CM

## 2021-05-22 LAB — BASIC METABOLIC PANEL
Anion gap: 7 (ref 5–15)
BUN: 11 mg/dL (ref 6–20)
CO2: 24 mmol/L (ref 22–32)
Calcium: 8 mg/dL — ABNORMAL LOW (ref 8.9–10.3)
Chloride: 104 mmol/L (ref 98–111)
Creatinine, Ser: 0.4 mg/dL — ABNORMAL LOW (ref 0.61–1.24)
GFR, Estimated: >60 mL/min (ref >60)
Glucose, Bld: 98 mg/dL (ref 70–99)
Potassium: 4 mmol/L (ref 3.5–5.1)
Sodium: 135 mmol/L (ref 135–145)

## 2021-05-22 LAB — MAGNESIUM: Magnesium: 2.1 mg/dL (ref 1.7–2.4)

## 2021-05-22 LAB — CBC
HCT: 24.5 % — ABNORMAL LOW (ref 39.0–52.0)
Hemoglobin: 7.5 g/dL — ABNORMAL LOW (ref 13.0–17.0)
MCH: 24.3 pg — ABNORMAL LOW (ref 26.0–34.0)
MCHC: 30.6 g/dL (ref 30.0–36.0)
MCV: 79.3 fL — ABNORMAL LOW (ref 80.0–100.0)
Platelets: 445 10*3/uL — ABNORMAL HIGH (ref 150–400)
RBC: 3.09 MIL/uL — ABNORMAL LOW (ref 4.22–5.81)
RDW: 17.7 % — ABNORMAL HIGH (ref 11.5–15.5)
WBC: 5.5 10*3/uL (ref 4.0–10.5)
nRBC: 0 % (ref 0.0–0.2)

## 2021-05-22 NOTE — Progress Notes (Signed)
Right upper extremity venous duplex completed. Refer to "CV Proc" under chart review to view preliminary results.  Preliminary results discussed with Dr. Manson Passey.  05/22/2021 3:29 PM Eula Fried., MHA, RVT, RDCS, RDMS

## 2021-05-23 LAB — URINALYSIS, ROUTINE W REFLEX MICROSCOPIC
Bilirubin Urine: NEGATIVE
Glucose, UA: NEGATIVE mg/dL
Hgb urine dipstick: NEGATIVE
Ketones, ur: NEGATIVE mg/dL
Nitrite: NEGATIVE
Protein, ur: NEGATIVE mg/dL
Specific Gravity, Urine: 1.023 (ref 1.005–1.030)
pH: 5 (ref 5.0–8.0)

## 2021-05-25 LAB — URINE CULTURE
Culture: >=100000 — AB
Special Requests: NORMAL

## 2021-05-25 NOTE — Progress Notes (Signed)
PROGRESS NOTE    [name]  ZOX:[id number] DOB: 1975/09/28 DOA: 04/02/2021  Brief Narrative:  Jonathon York is an 46 y.o. male with medical history significant of paraplegia for the last 4 years, chronic sacral decubitus ulcers, neurogenic bladder with suprapubic catheter, osteomyelitis of the sacrum.  Patient apparently fell at home and could not get himself up.  He was found on the ground by the family about 4 days after.  He apparently drank his own urine during this period.  He was found to be covered in stool.  He presented to [facility] on 03/21/2021.  At the time of admission he met sepsis criteria with worsening of the chronic sacral pressure ulcers.  He was initially admitted to the ICU service and was placed on pressors for septic shock. On 03/24/2021 he underwent excisional debridement of the sacral and bilateral gluteal wounds by general surgery.  He was also seen by orthopedic surgery on 03/28/2021 due to exposed bones.  He was initially started on broad-spectrum antimicrobials. His blood cultures at the acute facility showed Strep anginosis and he was de-escalated to IV ceftriaxone.  He was later escalated to IV Zosyn.  His hospital course complicated by AKI.  His wound cultures showed Pseudomonas which was resistant to Zosyn.  He has been on treatment with IV meropenem.  Due to his complex medical problems he was transferred to [place].  He remains on treatment with meropenem.  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   Remains on treatment with IV meropenem.  Unfortunately has exposure of the bone.  The Pseudomonas was resistant to Zosyn and cephalosporins.  Primary team asking for oral antibiotic options so he can be discharged.  However, on reviewing the cultures unfortunately there is no oral options because the Pseudomonas is also resistant to floroquinolones.  Therefore, recommend to continue treatment with IV meropenem.  Please monitor CBC, BUN/creatinine closely while on antibiotics.  Unfortunately antibiotic alone is not going to help as this may never heal and he might need Girdlestone/flap/graft procedure if the wound is not healing, he may need to be transferred to a specialized center.  Patient has been refusing antibiotics.  Discussed with the patient and he is aware that there is no oral options available and he needs to continue with IV antibiotic in order to enable healing. -Consider repeat imaging in the next week or so to evaluate and see if the osteomyelitis is improving.   Stage IV sacral pressure ulcer/bilateral ischial stage IV pressure ulcer: Continue local wound care.  Antibiotics as mentioned above.  Images reviewed.  If worsening, consider consulting surgery.  Unfortunately due to his paraplegia and inability to offload this will likely take a long time to heal. He is also high risk for recurrent sepsis and bacteremia.  If he starts having fevers greater than 101 or worsening leukocytosis recommend to send for pancultures.   Bacteremia: He had bacteremia with strep anginosis at the acute facility and was treated with ceftriaxone.  Repeat blood cultures were negative.  Currently on IV meropenem as mentioned above.  He is however, high risk for recurrent sepsis and bacteremia.  If  he starts having fever greater than 101 or worsening leukocytosis recommend to send for pancultures including blood cultures.   Paraplegia: Due to his paraplegia and immobility he is high risk for worsening of his pressure  ulcers.   Acute kidney injury: Resolved.  Continue to monitor BUN/creatinine closely while on antibiotic.   Neurogenic bladder with chronic suprapubic catheter: Due to this he is high risk for recurrent UTI.  Recent urine cultures showed yeast.  Likely colonization?  If he starts having any fevers or worsening leukocytosis consider treatment with fluconazole.  On antibiotics as mentioned above.   Anemia/severe protein calorie malnutrition: He is status post PRBCs.  Continue to monitor hemoglobin.  Management of protein calorie malnutrition per the primary team. Unfortunately due to his complex medical problems he is high risk for worsening and decompensation.   Plan of care discussed with primary team and pharmacy.    Subjective: Patient reportedly refused IV antibiotics.  He complained of right upper extremity pain.  No other acute events reported.  Objective: Vitals: Temperature 97.3, pulse 96, respiratory 18, blood pressure 112/66, oxygen saturation 99%   Examination: Constitutional: awake, not in any acute distress at this time Head: Atraumatic, normocephalic Eyes: PERLA, EOMI, irises appear normal, anicteric sclera,  ENMT: external ears and nose appear normal, normal hearing, moist oral mucosa             Neck: Supple CVS: S1-S2  Respiratory: Decreased breath sounds lower lobes, no rhonchi or wheezing Abdomen: soft nontender, nondistended, normal bowel sounds, suprapubic catheter Musculoskeletal: Paraplegic Neuro: Awake and oriented, paraplegic Psych: stable mood and affect, mental status Skin: Sacral and hip wounds with dressing in place  Data Reviewed: I have personally reviewed following labs and imaging studies  CBC: Recent Labs  Lab 05/19/21 0342 05/22/21 0526  WBC  --  5.5  HGB 7.8* 7.5*  HCT 25.9* 24.5*  MCV  --  79.3*  PLT  --  445*     Basic Metabolic Panel: Recent Labs  Lab 05/21/21 0500 05/22/21 0526  NA  --  135  K  --  4.0  CL  --  104  CO2  --   24  GLUCOSE  --  98  BUN  --  11  CREATININE  --  0.40*  CALCIUM  --  8.0*  MG 2.0 2.1  PHOS 4.6  --      GFR: CrCl cannot be calculated (Unknown ideal weight.).  Liver Function Tests: No results for input(s): AST, ALT, ALKPHOS, BILITOT, PROT, ALBUMIN in the last 168 hours.   CBG: No results for input(s): GLUCAP in the last 168 hours.   Recent Results (from the past 240 hour(s))  Urine Culture     Status: Abnormal   Collection Time: 05/23/21 12:24 PM   Specimen: Urine, Catheterized  Result Value Ref Range Status   Specimen Description URINE, CATHETERIZED  Final   Special Requests   Final    Normal Performed at [facility], [address], [place], [place] [address]    Culture >=100,000 COLONIES/mL YEAST (A)  Final   Report Status 05/25/2021 FINAL  Final      Radiology Studies: No results found.  Scheduled Meds: Please see MAR   Yaakov Guthrie, MD  05/25/2021, 9:45 PM

## 2021-05-28 LAB — BASIC METABOLIC PANEL
Anion gap: 11 (ref 5–15)
BUN: 8 mg/dL (ref 6–20)
CO2: 25 mmol/L (ref 22–32)
Calcium: 8.8 mg/dL — ABNORMAL LOW (ref 8.9–10.3)
Chloride: 100 mmol/L (ref 98–111)
Creatinine, Ser: 0.51 mg/dL — ABNORMAL LOW (ref 0.61–1.24)
GFR, Estimated: >60 mL/min (ref >60)
Glucose, Bld: 94 mg/dL (ref 70–99)
Potassium: 3.7 mmol/L (ref 3.5–5.1)
Sodium: 136 mmol/L (ref 135–145)

## 2021-05-28 LAB — CBC
HCT: 32.8 % — ABNORMAL LOW (ref 39.0–52.0)
Hemoglobin: 9.6 g/dL — ABNORMAL LOW (ref 13.0–17.0)
MCH: 23.6 pg — ABNORMAL LOW (ref 26.0–34.0)
MCHC: 29.3 g/dL — ABNORMAL LOW (ref 30.0–36.0)
MCV: 80.6 fL (ref 80.0–100.0)
Platelets: 555 10*3/uL — ABNORMAL HIGH (ref 150–400)
RBC: 4.07 MIL/uL — ABNORMAL LOW (ref 4.22–5.81)
RDW: 18.4 % — ABNORMAL HIGH (ref 11.5–15.5)
WBC: 6.7 10*3/uL (ref 4.0–10.5)
nRBC: 0 % (ref 0.0–0.2)

## 2021-05-28 LAB — RESP PANEL BY RT-PCR (FLU A&B, COVID) ARPGX2
Influenza A by PCR: NEGATIVE
Influenza B by PCR: NEGATIVE
SARS Coronavirus 2 by RT PCR: NEGATIVE

## 2021-05-30 ENCOUNTER — Other Ambulatory Visit (HOSPITAL_COMMUNITY): Payer: Self-pay

## 2021-05-30 LAB — TRIGLYCERIDES: Triglycerides: 196 mg/dL — ABNORMAL HIGH (ref <150)

## 2021-05-30 LAB — PHOSPHORUS: Phosphorus: 4.1 mg/dL (ref 2.5–4.6)

## 2021-05-30 LAB — MAGNESIUM: Magnesium: 1.9 mg/dL (ref 1.7–2.4)

## 2021-06-02 LAB — PHOSPHORUS: Phosphorus: 3.9 mg/dL (ref 2.5–4.6)

## 2021-06-02 LAB — MAGNESIUM: Magnesium: 1.9 mg/dL (ref 1.7–2.4)

## 2021-06-03 NOTE — Progress Notes (Signed)
PROGRESS NOTE    Jonathon York  HQI:[id number] DOB: 10-25-1975 DOA: 04/02/2021  Brief Narrative:  Jonathon York is an 46 y.o. male with medical history significant of paraplegia for the last 4 years, chronic sacral decubitus ulcers, neurogenic bladder with suprapubic catheter, osteomyelitis of the sacrum.  Patient apparently fell at home and could not get himself up.  He was found on the ground by the family about 4 days after.  He apparently drank his own urine during this period.  He was found to be covered in stool.  He presented to [facility] on 03/21/2021.  At the time of admission he met sepsis criteria with worsening of the chronic sacral pressure ulcers.  He was initially admitted to the ICU service and was placed on pressors for septic shock. On 03/24/2021 he underwent excisional debridement of the sacral and bilateral gluteal wounds by general surgery.  He was also seen by orthopedic surgery on 03/28/2021 due to exposed bones.  He was initially started on broad-spectrum antimicrobials. His blood cultures at the acute facility showed strep anginosis and he was de-escalated to IV ceftriaxone.  He was later escalated to IV Zosyn.  His hospital course complicated by AKI.  His wound cultures showed Pseudomonas which was resistant to Zosyn.  He has been on treatment with IV meropenem.  Due to his complex medical problems he was transferred to [place].  He remains on treatment with meropenem.  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  Remains on treatment with IV meropenem.  Unfortunately had exposure of the bone.  The Pseudomonas was resistant to Zosyn and cephalosporins.  On reviewing his culture results unfortunately no oral antibiotic options available due to the resistance pattern of the Pseudomonas.  Recommend to continue treatment with IV meropenem.  Please monitor CBC, BUN/creatinine closely while on antibiotics.  Unfortunately antibiotic alone is not going to help as this may never heal and he might need Girdlestone/flap/graft procedure, if the wound is not healing he may need to be transferred to a specialized center.  MRI of the right and left hip without contrast unfortunately continues to show osteomyelitis with chronic bony destruction of the lower sacrum, coccyx and inferior pubic rami and ischial tuberosities.  Per report he was also noted to have diffuse intramuscular edema of the hips right greater than left with muscle atrophy, likely combination of myositis and denervation changes with probable multiple small abscesses along the right hip.   Stage IV sacral pressure ulcer/right trochanteric/right gluteal stage IV pressure ulcer: Continue local wound care.  Antibiotics as mentioned above.  Images reviewed.  If worsening, consider consulting surgery.  Unfortunately due to his paraplegia and inability to offload this will take a long time to heal. -He is high risk for recurrent sepsis and bacteremia.  If he starts having fevers greater than 101 or worsening leukocytosis recommend to send for pancultures.   Bacteremia: He had bacteremia with strep anginosis at the acute facility and was treated with ceftriaxone.  Repeat blood cultures were negative.  Currently on IV meropenem as mentioned above.  He is however, high risk  for recurrent sepsis and bacteremia.  If he starts having fever greater than 101 or worsening leukocytosis recommend to send for pancultures including blood  cultures.   Paraplegia: Due to his paraplegia and immobility he is high risk for worsening of his pressure ulcers.  Right upper extremity DVT: On Eliquis.  Further management per the primary team.   Acute kidney injury: Resolved.  Continue to monitor BUN/creatinine closely while on antibiotic.   Chronic suprapubic catheter: Due to this he is high risk for recurrent UTI.  On antibiotics as mentioned above.   Anemia/severe protein calorie malnutrition: Continue to monitor hemoglobin.  Management of protein calorie malnutrition per the primary team. Unfortunately due to his complex medical problems he is high risk for worsening and decompensation.   Plan of care discussed with primary team and pharmacy.    Subjective: Denies having any complaints.  No acute events reported.    Objective: Vitals: Temperature 98.4, pulse 96, respiratory rate 20, blood pressure 90/50, oxygen saturation 97%   Examination: Constitutional: awake, not in any acute distress at this time Head: Atraumatic, normocephalic Eyes: PERLA, EOMI, irises appear normal, anicteric sclera,  ENMT: external ears and nose appear normal, normal hearing, moist oral mucosa             Neck: Supple CVS: S1-S2  Respiratory: Decreased breath sounds lower lobes, no rhonchi or wheezing Abdomen: soft nontender, nondistended, normal bowel sounds Musculoskeletal: Paraplegic Neuro: Paraplegic Psych: stable mood and affect, mental status Skin: Sacral and hip wounds with dressing in place  Data Reviewed: I have personally reviewed following labs and imaging studies  CBC: Recent Labs  Lab 05/28/21 1154  WBC 6.7  HGB 9.6*  HCT 32.8*  MCV 80.6  PLT 555*     Basic Metabolic Panel: Recent Labs  Lab 05/28/21 1154 05/30/21 1156 06/02/21 1155  NA 136  --   --   K 3.7  --   --   CL 100  --   --   CO2 25  --   --   GLUCOSE 94  --   --   BUN 8  --   --   CREATININE 0.51*  --   --   CALCIUM 8.8*  --   --   MG  --  1.9 1.9   PHOS  --  4.1 3.9     GFR: CrCl cannot be calculated (Unknown ideal weight.).  Liver Function Tests: No results for input(s): AST, ALT, ALKPHOS, BILITOT, PROT, ALBUMIN in the last 168 hours.   CBG: No results for input(s): GLUCAP in the last 168 hours.   Recent Results (from the past 240 hour(s))  Resp Panel by RT-PCR (Flu A&B, Covid) Nasopharyngeal Swab     Status: None   Collection Time: 05/28/21  8:37 AM   Specimen: Nasopharyngeal Swab; Nasopharyngeal(NP) swabs in vial transport medium  Result Value Ref Range Status   SARS Coronavirus 2 by RT PCR NEGATIVE NEGATIVE Final    Comment: (NOTE) SARS-CoV-2 target nucleic acids are NOT DETECTED.  The SARS-CoV-2 RNA is generally detectable in upper respiratory specimens during the acute phase of infection. The lowest concentration of SARS-CoV-2 viral copies this assay can detect is 138 copies/mL. A negative result does not preclude SARS-Cov-2 infection and should not be used as the sole basis for treatment or other patient management decisions. A negative result may occur with  improper specimen collection/handling, submission of specimen other than nasopharyngeal swab, presence of viral mutation(s) within the areas targeted by  this assay, and inadequate number of viral copies(<138 copies/mL). A negative result must be combined with clinical observations, patient history, and epidemiological information. The expected result is Negative.  Fact Sheet for Patients:  EntrepreneurPulse.com.au  Fact Sheet for Healthcare Providers:  IncredibleEmployment.be  This test is no t yet approved or cleared by the [place] FDA and  has been authorized for detection and/or diagnosis of SARS-CoV-2 by FDA under an Emergency Use Authorization (EUA). This EUA will remain  in effect (meaning this test can be used) for the duration of the COVID-19 declaration under Section 564(b)(1) of the Act,  21 U.S.C.section 360bbb-3(b)(1), unless the authorization is terminated  or revoked sooner.       Influenza A by PCR NEGATIVE NEGATIVE Final   Influenza B by PCR NEGATIVE NEGATIVE Final    Comment: (NOTE) The Xpert Xpress SARS-CoV-2/FLU/RSV plus assay is intended as an aid in the diagnosis of influenza from Nasopharyngeal swab specimens and should not be used as a sole basis for treatment. Nasal washings and aspirates are unacceptable for Xpert Xpress SARS-CoV-2/FLU/RSV testing.  Fact Sheet for Patients: EntrepreneurPulse.com.au  Fact Sheet for Healthcare Providers: IncredibleEmployment.be  This test is not yet approved or cleared by the [place] FDA and has been authorized for detection and/or diagnosis of SARS-CoV-2 by FDA under an Emergency Use Authorization (EUA). This EUA will remain in effect (meaning this test can be used) for the duration of the COVID-19 declaration under Section 564(b)(1) of the Act, 21 U.S.C. section 360bbb-3(b)(1), unless the authorization is terminated or revoked.  Performed at [facility], [place] [address] [place], [place] [address]       Radiology Studies: No results found.  Scheduled Meds: Please see MAR   Yaakov Guthrie, MD  06/03/2021, 10:40 PM

## 2021-06-04 LAB — CBC
HCT: 26.7 % — ABNORMAL LOW (ref 39.0–52.0)
Hemoglobin: 7.9 g/dL — ABNORMAL LOW (ref 13.0–17.0)
MCH: 23.9 pg — ABNORMAL LOW (ref 26.0–34.0)
MCHC: 29.6 g/dL — ABNORMAL LOW (ref 30.0–36.0)
MCV: 80.9 fL (ref 80.0–100.0)
Platelets: 316 10*3/uL (ref 150–400)
RBC: 3.3 MIL/uL — ABNORMAL LOW (ref 4.22–5.81)
RDW: 18.6 % — ABNORMAL HIGH (ref 11.5–15.5)
WBC: 5.1 10*3/uL (ref 4.0–10.5)
nRBC: 0 % (ref 0.0–0.2)

## 2021-06-04 LAB — BASIC METABOLIC PANEL
Anion gap: 9 (ref 5–15)
BUN: 9 mg/dL (ref 6–20)
CO2: 25 mmol/L (ref 22–32)
Calcium: 8.2 mg/dL — ABNORMAL LOW (ref 8.9–10.3)
Chloride: 103 mmol/L (ref 98–111)
Creatinine, Ser: 0.37 mg/dL — ABNORMAL LOW (ref 0.61–1.24)
GFR, Estimated: >60 mL/min (ref >60)
Glucose, Bld: 117 mg/dL — ABNORMAL HIGH (ref 70–99)
Potassium: 3.6 mmol/L (ref 3.5–5.1)
Sodium: 137 mmol/L (ref 135–145)

## 2021-06-04 LAB — MAGNESIUM: Magnesium: 1.8 mg/dL (ref 1.7–2.4)

## 2021-06-05 LAB — PHOSPHORUS: Phosphorus: 4.4 mg/dL (ref 2.5–4.6)

## 2021-06-05 LAB — MAGNESIUM: Magnesium: 2.1 mg/dL (ref 1.7–2.4)

## 2021-06-06 LAB — TRIGLYCERIDES: Triglycerides: 155 mg/dL — ABNORMAL HIGH (ref <150)

## 2021-06-08 LAB — PHOSPHORUS: Phosphorus: 4.3 mg/dL (ref 2.5–4.6)

## 2021-06-08 LAB — MAGNESIUM: Magnesium: 2 mg/dL (ref 1.7–2.4)

## 2021-06-11 LAB — PHOSPHORUS: Phosphorus: 4.3 mg/dL (ref 2.5–4.6)

## 2021-06-11 LAB — MAGNESIUM: Magnesium: 2.1 mg/dL (ref 1.7–2.4)

## 2021-06-12 LAB — CBC
HCT: 30.6 % — ABNORMAL LOW (ref 39.0–52.0)
Hemoglobin: 8.7 g/dL — ABNORMAL LOW (ref 13.0–17.0)
MCH: 23.1 pg — ABNORMAL LOW (ref 26.0–34.0)
MCHC: 28.4 g/dL — ABNORMAL LOW (ref 30.0–36.0)
MCV: 81.2 fL (ref 80.0–100.0)
Platelets: 306 10*3/uL (ref 150–400)
RBC: 3.77 MIL/uL — ABNORMAL LOW (ref 4.22–5.81)
RDW: 18 % — ABNORMAL HIGH (ref 11.5–15.5)
WBC: 4.3 10*3/uL (ref 4.0–10.5)
nRBC: 0 % (ref 0.0–0.2)

## 2021-06-12 LAB — COMPREHENSIVE METABOLIC PANEL
ALT: 21 U/L (ref 0–44)
AST: 22 U/L (ref 15–41)
Albumin: 1.9 g/dL — ABNORMAL LOW (ref 3.5–5.0)
Alkaline Phosphatase: 89 U/L (ref 38–126)
Anion gap: 9 (ref 5–15)
BUN: 16 mg/dL (ref 6–20)
CO2: 25 mmol/L (ref 22–32)
Calcium: 8.4 mg/dL — ABNORMAL LOW (ref 8.9–10.3)
Chloride: 104 mmol/L (ref 98–111)
Creatinine, Ser: 0.42 mg/dL — ABNORMAL LOW (ref 0.61–1.24)
GFR, Estimated: >60 mL/min (ref >60)
Glucose, Bld: 93 mg/dL (ref 70–99)
Potassium: 4.3 mmol/L (ref 3.5–5.1)
Sodium: 138 mmol/L (ref 135–145)
Total Bilirubin: 0.4 mg/dL (ref 0.3–1.2)
Total Protein: 6.4 g/dL — ABNORMAL LOW (ref 6.5–8.1)

## 2021-06-12 LAB — C-REACTIVE PROTEIN: CRP: 9.7 mg/dL — ABNORMAL HIGH (ref <1.0)

## 2021-06-12 LAB — PROTIME-INR
INR: 1.1 (ref 0.8–1.2)
Prothrombin Time: 14.4 seconds (ref 11.4–15.2)

## 2021-06-12 LAB — MAGNESIUM: Magnesium: 2.1 mg/dL (ref 1.7–2.4)

## 2021-06-13 LAB — PHOSPHORUS: Phosphorus: 4.4 mg/dL (ref 2.5–4.6)

## 2021-06-13 LAB — TRIGLYCERIDES: Triglycerides: 168 mg/dL — ABNORMAL HIGH (ref <150)

## 2021-06-14 LAB — PHOSPHORUS: Phosphorus: 4.3 mg/dL (ref 2.5–4.6)

## 2021-06-14 LAB — MAGNESIUM: Magnesium: 2 mg/dL (ref 1.7–2.4)

## 2021-06-20 LAB — TRIGLYCERIDES: Triglycerides: 165 mg/dL — ABNORMAL HIGH (ref <150)

## 2021-06-25 ENCOUNTER — Other Ambulatory Visit (HOSPITAL_COMMUNITY): Payer: Self-pay

## 2021-06-25 LAB — COMPREHENSIVE METABOLIC PANEL
ALT: 16 U/L (ref 0–44)
AST: 21 U/L (ref 15–41)
Albumin: 2.4 g/dL — ABNORMAL LOW (ref 3.5–5.0)
Alkaline Phosphatase: 113 U/L (ref 38–126)
Anion gap: 15 (ref 5–15)
BUN: 10 mg/dL (ref 6–20)
CO2: 22 mmol/L (ref 22–32)
Calcium: 8.6 mg/dL — ABNORMAL LOW (ref 8.9–10.3)
Chloride: 103 mmol/L (ref 98–111)
Creatinine, Ser: 0.62 mg/dL (ref 0.61–1.24)
GFR, Estimated: >60 mL/min (ref >60)
Glucose, Bld: 72 mg/dL (ref 70–99)
Potassium: 3.8 mmol/L (ref 3.5–5.1)
Sodium: 140 mmol/L (ref 135–145)
Total Bilirubin: 0.3 mg/dL (ref 0.3–1.2)
Total Protein: 6.5 g/dL (ref 6.5–8.1)

## 2021-06-25 LAB — CBC
HCT: 33.2 % — ABNORMAL LOW (ref 39.0–52.0)
Hemoglobin: 9.3 g/dL — ABNORMAL LOW (ref 13.0–17.0)
MCH: 22.7 pg — ABNORMAL LOW (ref 26.0–34.0)
MCHC: 28 g/dL — ABNORMAL LOW (ref 30.0–36.0)
MCV: 81 fL (ref 80.0–100.0)
Platelets: 344 10*3/uL (ref 150–400)
RBC: 4.1 MIL/uL — ABNORMAL LOW (ref 4.22–5.81)
RDW: 17 % — ABNORMAL HIGH (ref 11.5–15.5)
WBC: 5 10*3/uL (ref 4.0–10.5)
nRBC: 0 % (ref 0.0–0.2)

## 2021-06-25 LAB — C-REACTIVE PROTEIN: CRP: 11.9 mg/dL — ABNORMAL HIGH (ref <1.0)

## 2021-06-25 LAB — PHOSPHORUS: Phosphorus: 4.5 mg/dL (ref 2.5–4.6)

## 2021-06-25 LAB — MAGNESIUM: Magnesium: 1.9 mg/dL (ref 1.7–2.4)

## 2021-06-27 LAB — TRIGLYCERIDES: Triglycerides: 162 mg/dL — ABNORMAL HIGH (ref <150)

## 2021-06-29 LAB — COMPREHENSIVE METABOLIC PANEL
ALT: 13 U/L (ref 0–44)
AST: 15 U/L (ref 15–41)
Albumin: 2.2 g/dL — ABNORMAL LOW (ref 3.5–5.0)
Alkaline Phosphatase: 114 U/L (ref 38–126)
Anion gap: 9 (ref 5–15)
BUN: 11 mg/dL (ref 6–20)
CO2: 25 mmol/L (ref 22–32)
Calcium: 8.4 mg/dL — ABNORMAL LOW (ref 8.9–10.3)
Chloride: 103 mmol/L (ref 98–111)
Creatinine, Ser: 0.41 mg/dL — ABNORMAL LOW (ref 0.61–1.24)
GFR, Estimated: >60 mL/min (ref >60)
Glucose, Bld: 99 mg/dL (ref 70–99)
Potassium: 4.2 mmol/L (ref 3.5–5.1)
Sodium: 137 mmol/L (ref 135–145)
Total Bilirubin: 0.4 mg/dL (ref 0.3–1.2)
Total Protein: 6.3 g/dL — ABNORMAL LOW (ref 6.5–8.1)

## 2021-06-29 LAB — CBC
HCT: 31.8 % — ABNORMAL LOW (ref 39.0–52.0)
Hemoglobin: 9.2 g/dL — ABNORMAL LOW (ref 13.0–17.0)
MCH: 22.7 pg — ABNORMAL LOW (ref 26.0–34.0)
MCHC: 28.9 g/dL — ABNORMAL LOW (ref 30.0–36.0)
MCV: 78.5 fL — ABNORMAL LOW (ref 80.0–100.0)
Platelets: 331 10*3/uL (ref 150–400)
RBC: 4.05 MIL/uL — ABNORMAL LOW (ref 4.22–5.81)
RDW: 16.5 % — ABNORMAL HIGH (ref 11.5–15.5)
WBC: 5.6 10*3/uL (ref 4.0–10.5)
nRBC: 0 % (ref 0.0–0.2)

## 2021-06-29 LAB — C-REACTIVE PROTEIN: CRP: 9.7 mg/dL — ABNORMAL HIGH (ref <1.0)

## 2021-07-01 ENCOUNTER — Other Ambulatory Visit (HOSPITAL_COMMUNITY): Payer: Self-pay

## 2021-07-02 ENCOUNTER — Other Ambulatory Visit (HOSPITAL_COMMUNITY): Payer: Self-pay

## 2021-07-02 LAB — BASIC METABOLIC PANEL
Anion gap: 8 (ref 5–15)
BUN: 11 mg/dL (ref 6–20)
CO2: 24 mmol/L (ref 22–32)
Calcium: 8.1 mg/dL — ABNORMAL LOW (ref 8.9–10.3)
Chloride: 103 mmol/L (ref 98–111)
Creatinine, Ser: 0.61 mg/dL (ref 0.61–1.24)
GFR, Estimated: >60 mL/min (ref >60)
Glucose, Bld: 100 mg/dL — ABNORMAL HIGH (ref 70–99)
Potassium: 3.6 mmol/L (ref 3.5–5.1)
Sodium: 135 mmol/L (ref 135–145)

## 2021-07-03 LAB — CBC
HCT: 29.6 % — ABNORMAL LOW (ref 39.0–52.0)
Hemoglobin: 9.1 g/dL — ABNORMAL LOW (ref 13.0–17.0)
MCH: 23.3 pg — ABNORMAL LOW (ref 26.0–34.0)
MCHC: 30.7 g/dL (ref 30.0–36.0)
MCV: 75.9 fL — ABNORMAL LOW (ref 80.0–100.0)
Platelets: 361 10*3/uL (ref 150–400)
RBC: 3.9 MIL/uL — ABNORMAL LOW (ref 4.22–5.81)
RDW: 16.6 % — ABNORMAL HIGH (ref 11.5–15.5)
WBC: 5.5 10*3/uL (ref 4.0–10.5)
nRBC: 0 % (ref 0.0–0.2)

## 2021-07-09 ENCOUNTER — Encounter: Payer: Self-pay | Admitting: Internal Medicine

## 2021-07-09 ENCOUNTER — Inpatient Hospital Stay
Admission: AD | Admit: 2021-07-09 | Discharge: 2021-07-28 | DRG: 539 | Disposition: A | Discharge status: Home Health | Payer: Medicare HMO | Source: Ambulatory Visit | Attending: Internal Medicine | Admitting: Internal Medicine

## 2021-07-09 ENCOUNTER — Ambulatory Visit: Payer: Medicare HMO | Admitting: Internal Medicine

## 2021-07-09 DIAGNOSIS — B952 Enterococcus as the cause of diseases classified elsewhere: Secondary | ICD-10-CM | POA: Diagnosis present

## 2021-07-09 DIAGNOSIS — L89229 Pressure ulcer of left hip, unspecified stage: Secondary | ICD-10-CM | POA: Diagnosis not present

## 2021-07-09 DIAGNOSIS — A498 Other bacterial infections of unspecified site: Secondary | ICD-10-CM | POA: Diagnosis present

## 2021-07-09 DIAGNOSIS — M24451 Recurrent dislocation, right hip: Secondary | ICD-10-CM | POA: Diagnosis not present

## 2021-07-09 DIAGNOSIS — D509 Iron deficiency anemia, unspecified: Secondary | ICD-10-CM | POA: Diagnosis present

## 2021-07-09 DIAGNOSIS — D72819 Decreased white blood cell count, unspecified: Secondary | ICD-10-CM | POA: Diagnosis not present

## 2021-07-09 DIAGNOSIS — M86652 Other chronic osteomyelitis, left thigh: Secondary | ICD-10-CM | POA: Diagnosis not present

## 2021-07-09 DIAGNOSIS — R059 Cough, unspecified: Secondary | ICD-10-CM

## 2021-07-09 DIAGNOSIS — M009 Pyogenic arthritis, unspecified: Secondary | ICD-10-CM | POA: Diagnosis present

## 2021-07-09 DIAGNOSIS — M8588 Other specified disorders of bone density and structure, other site: Secondary | ICD-10-CM | POA: Diagnosis not present

## 2021-07-09 DIAGNOSIS — D649 Anemia, unspecified: Secondary | ICD-10-CM | POA: Diagnosis not present

## 2021-07-09 DIAGNOSIS — M86651 Other chronic osteomyelitis, right thigh: Secondary | ICD-10-CM | POA: Diagnosis not present

## 2021-07-09 DIAGNOSIS — M4628 Osteomyelitis of vertebra, sacral and sacrococcygeal region: Principal | ICD-10-CM | POA: Diagnosis present

## 2021-07-09 DIAGNOSIS — Z9359 Other cystostomy status: Secondary | ICD-10-CM | POA: Diagnosis not present

## 2021-07-09 DIAGNOSIS — E876 Hypokalemia: Secondary | ICD-10-CM | POA: Diagnosis present

## 2021-07-09 DIAGNOSIS — D709 Neutropenia, unspecified: Secondary | ICD-10-CM | POA: Diagnosis not present

## 2021-07-09 DIAGNOSIS — R7881 Bacteremia: Secondary | ICD-10-CM | POA: Diagnosis present

## 2021-07-09 DIAGNOSIS — Z7189 Other specified counseling: Secondary | ICD-10-CM | POA: Diagnosis not present

## 2021-07-09 DIAGNOSIS — R64 Cachexia: Secondary | ICD-10-CM | POA: Diagnosis present

## 2021-07-09 DIAGNOSIS — L89224 Pressure ulcer of left hip, stage 4: Secondary | ICD-10-CM | POA: Diagnosis present

## 2021-07-09 DIAGNOSIS — Z960 Presence of urogenital implants: Secondary | ICD-10-CM | POA: Diagnosis not present

## 2021-07-09 DIAGNOSIS — S73001D Unspecified subluxation of right hip, subsequent encounter: Secondary | ICD-10-CM | POA: Diagnosis not present

## 2021-07-09 DIAGNOSIS — R350 Frequency of micturition: Secondary | ICD-10-CM | POA: Diagnosis present

## 2021-07-09 DIAGNOSIS — E559 Vitamin D deficiency, unspecified: Secondary | ICD-10-CM | POA: Diagnosis present

## 2021-07-09 DIAGNOSIS — E43 Unspecified severe protein-calorie malnutrition: Secondary | ICD-10-CM | POA: Diagnosis present

## 2021-07-09 DIAGNOSIS — M8668 Other chronic osteomyelitis, other site: Secondary | ICD-10-CM | POA: Diagnosis not present

## 2021-07-09 DIAGNOSIS — R197 Diarrhea, unspecified: Secondary | ICD-10-CM | POA: Diagnosis present

## 2021-07-09 DIAGNOSIS — S73004A Unspecified dislocation of right hip, initial encounter: Secondary | ICD-10-CM | POA: Diagnosis not present

## 2021-07-09 DIAGNOSIS — Z89421 Acquired absence of other right toe(s): Secondary | ICD-10-CM | POA: Diagnosis not present

## 2021-07-09 DIAGNOSIS — L89214 Pressure ulcer of right hip, stage 4: Secondary | ICD-10-CM | POA: Diagnosis present

## 2021-07-09 DIAGNOSIS — Z8619 Personal history of other infectious and parasitic diseases: Secondary | ICD-10-CM | POA: Diagnosis not present

## 2021-07-09 DIAGNOSIS — Z807 Family history of other malignant neoplasms of lymphoid, hematopoietic and related tissues: Secondary | ICD-10-CM

## 2021-07-09 DIAGNOSIS — Z20822 Contact with and (suspected) exposure to covid-19: Secondary | ICD-10-CM | POA: Diagnosis present

## 2021-07-09 DIAGNOSIS — Z681 Body mass index (BMI) 19 or less, adult: Secondary | ICD-10-CM | POA: Diagnosis not present

## 2021-07-09 DIAGNOSIS — Z87891 Personal history of nicotine dependence: Secondary | ICD-10-CM | POA: Diagnosis not present

## 2021-07-09 DIAGNOSIS — M00851 Arthritis due to other bacteria, right hip: Secondary | ICD-10-CM | POA: Diagnosis not present

## 2021-07-09 DIAGNOSIS — L89514 Pressure ulcer of right ankle, stage 4: Secondary | ICD-10-CM | POA: Diagnosis not present

## 2021-07-09 DIAGNOSIS — N319 Neuromuscular dysfunction of bladder, unspecified: Secondary | ICD-10-CM | POA: Diagnosis present

## 2021-07-09 DIAGNOSIS — L89154 Pressure ulcer of sacral region, stage 4: Secondary | ICD-10-CM | POA: Diagnosis present

## 2021-07-09 DIAGNOSIS — G8220 Paraplegia, unspecified: Secondary | ICD-10-CM | POA: Diagnosis present

## 2021-07-09 DIAGNOSIS — E8809 Other disorders of plasma-protein metabolism, not elsewhere classified: Secondary | ICD-10-CM | POA: Diagnosis present

## 2021-07-09 DIAGNOSIS — E538 Deficiency of other specified B group vitamins: Secondary | ICD-10-CM | POA: Diagnosis present

## 2021-07-09 DIAGNOSIS — K219 Gastro-esophageal reflux disease without esophagitis: Secondary | ICD-10-CM | POA: Diagnosis present

## 2021-07-09 LAB — BASIC METABOLIC PANEL
Anion gap: 9 (ref 5–15)
BUN: 7 mg/dL (ref 6–20)
CO2: 22 mmol/L (ref 22–32)
Calcium: 7.4 mg/dL — ABNORMAL LOW (ref 8.9–10.3)
Chloride: 107 mmol/L (ref 98–111)
Creatinine, Ser: 0.51 mg/dL — ABNORMAL LOW (ref 0.61–1.24)
GFR, Estimated: >60 mL/min (ref >60)
Glucose, Bld: 112 mg/dL — ABNORMAL HIGH (ref 70–99)
Potassium: 3.2 mmol/L — ABNORMAL LOW (ref 3.5–5.1)
Sodium: 138 mmol/L (ref 135–145)

## 2021-07-09 LAB — LACTIC ACID, PLASMA
Lactic Acid, Venous: 1.7 mmol/L (ref 0.5–1.9)
Lactic Acid, Venous: 1.8 mmol/L (ref 0.5–1.9)

## 2021-07-09 LAB — CBC
HCT: 24.1 % — ABNORMAL LOW (ref 39.0–52.0)
Hemoglobin: 7.1 g/dL — ABNORMAL LOW (ref 13.0–17.0)
MCH: 21.6 pg — ABNORMAL LOW (ref 26.0–34.0)
MCHC: 29.5 g/dL — ABNORMAL LOW (ref 30.0–36.0)
MCV: 73.3 fL — ABNORMAL LOW (ref 80.0–100.0)
Platelets: 298 10*3/uL (ref 150–400)
RBC: 3.29 MIL/uL — ABNORMAL LOW (ref 4.22–5.81)
RDW: 16.6 % — ABNORMAL HIGH (ref 11.5–15.5)
WBC: 6.8 10*3/uL (ref 4.0–10.5)
nRBC: 0 % (ref 0.0–0.2)

## 2021-07-09 LAB — SEDIMENTATION RATE: Sed Rate: 107 mm/hr — ABNORMAL HIGH (ref 0–15)

## 2021-07-09 LAB — APTT: aPTT: 35 seconds (ref 24–36)

## 2021-07-09 LAB — RESP PANEL BY RT-PCR (FLU A&B, COVID) ARPGX2
Influenza A by PCR: NEGATIVE
Influenza B by PCR: NEGATIVE
SARS Coronavirus 2 by RT PCR: NEGATIVE

## 2021-07-09 LAB — PROTIME-INR
INR: 1.2 (ref 0.8–1.2)
Prothrombin Time: 15.5 seconds — ABNORMAL HIGH (ref 11.4–15.2)

## 2021-07-09 LAB — C-REACTIVE PROTEIN: CRP: 18.4 mg/dL — ABNORMAL HIGH (ref <1.0)

## 2021-07-09 MED ORDER — SODIUM CHLORIDE 0.9% FLUSH
10.0000 mL | Freq: Two times a day (BID) | INTRAVENOUS | Status: DC
  Administered 2021-07-09 – 2021-07-23 (×21): 10 mL

## 2021-07-09 MED ORDER — VANCOMYCIN HCL IN DEXTROSE 1-5 GM/200ML-% IV SOLN
1000.0000 mg | Freq: Once | INTRAVENOUS | Status: AC
  Administered 2021-07-09: 1000 mg via INTRAVENOUS
  Filled 2021-07-09 (×2): qty 200

## 2021-07-09 MED ORDER — ONDANSETRON HCL 4 MG/2ML IJ SOLN
4.0000 mg | Freq: Three times a day (TID) | INTRAMUSCULAR | Status: DC | PRN
  Administered 2021-07-20 – 2021-07-21 (×2): 4 mg via INTRAVENOUS
  Filled 2021-07-09 (×2): qty 2

## 2021-07-09 MED ORDER — ACETAMINOPHEN 325 MG PO TABS
650.0000 mg | ORAL_TABLET | Freq: Four times a day (QID) | ORAL | Status: DC | PRN
  Administered 2021-07-09: 650 mg via ORAL
  Filled 2021-07-09: qty 2

## 2021-07-09 MED ORDER — OXYCODONE-ACETAMINOPHEN 5-325 MG PO TABS
1.0000 | ORAL_TABLET | ORAL | Status: DC | PRN
  Administered 2021-07-09 – 2021-07-28 (×46): 1 via ORAL
  Filled 2021-07-09 (×47): qty 1

## 2021-07-09 MED ORDER — DIPHENHYDRAMINE HCL 50 MG/ML IJ SOLN: 12.5000 mg | Freq: Once | INTRAMUSCULAR | Status: DC | PRN

## 2021-07-09 MED ORDER — CHLORHEXIDINE GLUCONATE CLOTH 2 % EX PADS
6.0000 | MEDICATED_PAD | Freq: Every day | CUTANEOUS | Status: DC
  Administered 2021-07-10 – 2021-07-21 (×6): 6 via TOPICAL
  Administered 2021-07-22: 1 via TOPICAL
  Administered 2021-07-25: 6 via TOPICAL

## 2021-07-09 MED ORDER — HEPARIN SODIUM (PORCINE) 5000 UNIT/ML IJ SOLN
5000.0000 [IU] | Freq: Three times a day (TID) | INTRAMUSCULAR | Status: DC
  Administered 2021-07-09: 5000 [IU] via SUBCUTANEOUS
  Filled 2021-07-09 (×2): qty 1

## 2021-07-09 MED ORDER — VANCOMYCIN HCL 1250 MG/250ML IV SOLN
1250.0000 mg | INTRAVENOUS | Status: DC
  Filled 2021-07-09 (×2): qty 250

## 2021-07-09 MED ORDER — PANTOPRAZOLE SODIUM 40 MG PO TBEC
40.0000 mg | DELAYED_RELEASE_TABLET | Freq: Every day | ORAL | Status: DC
  Administered 2021-07-10 – 2021-07-27 (×16): 40 mg via ORAL
  Filled 2021-07-09 (×18): qty 1

## 2021-07-09 MED ORDER — TRAZODONE HCL 50 MG PO TABS
50.0000 mg | ORAL_TABLET | Freq: Every evening | ORAL | Status: DC | PRN
  Filled 2021-07-09 (×3): qty 1

## 2021-07-09 MED ORDER — ENSURE ENLIVE PO LIQD
237.0000 mL | Freq: Three times a day (TID) | ORAL | Status: DC
  Administered 2021-07-09 – 2021-07-10 (×2): 237 mL via ORAL

## 2021-07-09 MED ORDER — OCUVITE-LUTEIN PO CAPS
1.0000 | ORAL_CAPSULE | Freq: Every day | ORAL | Status: DC
  Administered 2021-07-09 – 2021-07-28 (×18): 1 via ORAL
  Filled 2021-07-09 (×20): qty 1

## 2021-07-09 MED ORDER — SODIUM CHLORIDE 0.9% FLUSH: 10.0000 mL | INTRAVENOUS | Status: DC | PRN

## 2021-07-09 MED ORDER — SODIUM CHLORIDE 0.9 % IV SOLN
1.0000 g | Freq: Three times a day (TID) | INTRAVENOUS | Status: DC
  Administered 2021-07-09 – 2021-07-10 (×2): 1 g via INTRAVENOUS
  Filled 2021-07-09 (×4): qty 20

## 2021-07-09 MED ORDER — SODIUM CHLORIDE 0.9 % IV BOLUS
500.0000 mL | Freq: Once | INTRAVENOUS | Status: AC
  Administered 2021-07-09: 500 mL via INTRAVENOUS

## 2021-07-09 MED ORDER — SODIUM CHLORIDE 0.9 % IV BOLUS
1500.0000 mL | Freq: Once | INTRAVENOUS | Status: AC
  Administered 2021-07-09: 1500 mL via INTRAVENOUS

## 2021-07-09 MED ORDER — LOPERAMIDE HCL 2 MG PO CAPS
2.0000 mg | ORAL_CAPSULE | ORAL | Status: DC | PRN
  Administered 2021-07-09: 2 mg via ORAL
  Filled 2021-07-09: qty 1

## 2021-07-09 NOTE — Consult Note (Signed)
SURGICAL CONSULTATION NOTE   HISTORY OF PRESENT ILLNESS (HPI):  46 y.o. 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  Patient reported that he was seen in [place] and he was discharged home with home health service.  As per patient he has been doing his own dressing changes.  He endorses having pain on both hips.  Pain localized to both knees.  There is no pain radiation.  There is no alleviating or aggravating factors.  Patient had a prolonged admission at the [facility] receiving local care.  Patient also complained about feeling his right hip loose.  There has been no image on this admission at this point.  Surgery is consulted by Dr. Blaine Hamper in this context for evaluation and management of complex bilateral decubitus and sacral ulcer.  PAST MEDICAL HISTORY (PMH):  Past Medical History:  Diagnosis Date   AKI (acute kidney injury) ([place])    Anxiety    Decubitus ulcer    Depression    Neurogenic bladder    Osteomyelitis ([place])    Paraparesis of both lower limbs ([place]) 02/21/00     PAST SURGICAL HISTORY ([place]):  Past Surgical History:  Procedure Laterality Date   AMPUTATION Right 03/19/2018   Procedure: 5th Metatarsal Resection;  Surgeon: Albertine Patricia, DPM;  Location: [facility] ORS;  Service: Podiatry;  Laterality: Right;   AMPUTATION TOE Right 04/29/2015   Procedure: AMPUTATION TOE;  Surgeon: Sharlotte Alamo, MD;  Location: [facility] ORS;  Service: Podiatry;  Laterality: Right;   INCISION AND DRAINAGE ABSCESS N/A 03/24/2021   Procedure: INCISION AND DRAINAGE STAGE IV DECUBITUS ULCER;  Surgeon: Herbert Pun, MD;  Location: [facility] ORS;  Service: General;  Laterality: N/A;   IRRIGATION AND DEBRIDEMENT BUTTOCKS     TOE AMPUTATION Right      MEDICATIONS:  Prior to Admission medications   Medication Sig Start Date End Date Taking? Authorizing Provider  Chlorhexidine Gluconate Cloth 2 % PADS  Apply 6 each topically daily. 04/03/21   Terrilee Croak, MD  collagenase (SANTYL) ointment Apply topically 2 (two) times daily. 04/02/21   Terrilee Croak, MD  feeding supplement (ENSURE ENLIVE / ENSURE PLUS) LIQD Take 237 mLs by mouth 3 (three) times daily between meals. 04/02/21   Terrilee Croak, MD  HYDROcodone-acetaminophen (NORCO/VICODIN) 5-325 MG tablet Take 1-2 tablets by mouth every 6 (six) hours as needed (breakthrough pain). 04/02/21   Terrilee Croak, MD  HYDROmorphone (DILAUDID) 2 MG tablet Take 0.5 tablets (1 mg total) by mouth every 6 (six) hours as needed for moderate pain. 04/02/21   Terrilee Croak, MD  magic mouthwash w/lidocaine SOLN Take 2 mLs by mouth 3 (three) times daily as needed for mouth pain. 04/02/21   Dahal, Marlowe Aschoff, MD  meropenem 1 g in sodium chloride 0.9 % 100 mL Inject 1 g into the vein every 8 (eight) hours. 04/02/21   Terrilee Croak, MD  multivitamin-lutein (OCUVITE-LUTEIN) CAPS capsule Take 1 capsule by mouth daily. 04/03/21   Dahal, Marlowe Aschoff, MD  polyethylene glycol (MIRALAX / GLYCOLAX) 17 g packet Take 17 g by mouth daily as needed for moderate constipation. 04/02/21   Terrilee Croak, MD  traZODone (DESYREL) 50 MG tablet Take 1 tablet (50 mg total) by mouth at bedtime as needed for sleep. 04/02/21   Terrilee Croak, MD  valACYclovir (VALTREX) 500 MG tablet Take 1 tablet (500 mg total) by mouth 2 (two) times daily. 04/02/21   Terrilee Croak, MD  ALLERGIES:  Allergies  Allergen Reactions   Orange Fruit [Citrus] Other (See Comments)    Blisters only from [place]!!! Patient is not allergic from all citrus   Sulfa Antibiotics     Throwing up blood      SOCIAL HISTORY:  Social History   Socioeconomic History   Marital status: Single    Spouse name: Not on file   Number of children: Not on file   Years of education: Not on file   Highest education level: Not on file  Occupational History   Occupation: disabled  Tobacco Use   Smoking status: Former    Packs/day: 0.50     Types: Cigarettes    Quit date: 2018    Years since quitting: 5.1   Smokeless tobacco: Never  Vaping Use   Vaping Use: Never used  Substance and Sexual Activity   Alcohol use: No   Drug use: Yes    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   Sexual activity: Never  Other Topics Concern   Not on file  Social History Narrative   Not on file   Social Determinants of Health   Financial Resource Strain: Not on file  Food Insecurity: Not on file  Transportation Needs: Not on file  Physical Activity: Not on file  Stress: Not on file  Social Connections: Not on file  Intimate Partner Violence: Not on file      FAMILY HISTORY:  Family History  Problem Relation Age of Onset   Lymphoma Sister      REVIEW OF SYSTEMS:  Constitutional: denies weight loss, fever, chills, or sweats  Eyes: denies any other vision changes, history of eye injury  ENT: denies sore throat, hearing problems  Respiratory: denies shortness of breath, wheezing  Cardiovascular: denies chest pain, palpitations  Gastrointestinal: Denies abdominal pain, nausea and vomiting Genitourinary: denies burning with urination or urinary frequency Musculoskeletal: Positive joint pains Skin: denies any other rashes or skin discolorations  Neurological: denies any other headache, dizziness, weakness  Psychiatric: denies any other depression, anxiety   All other review of systems were negative   VITAL SIGNS:  Temp:  [99.5 F (37.5 C)] 99.5 F (37.5 C) (03/13 1646) Pulse Rate:  [127] 127 (03/13 1646) Resp:  [20] 20 (03/13 1646) BP: (101)/(61) 101/61 (03/13 1646) SpO2:  [99 %] 99 % (03/13 1646) Weight:  [47.9 kg] 47.9 kg (03/13 1646)       Weight: 47.9 kg     INTAKE/OUTPUT:  This shift: No intake/output data recorded.  Last 2 shifts: @IOLAST2SHIFTS @   PHYSICAL EXAM:  Constitutional:  -- Cachectic -- Awake, alert, and oriented x3, cooperative Eyes:  -- Pupils equally round and reactive to light  --  No scleral icterus  Ear, nose, and throat:  -- No jugular venous distension  Pulmonary:  -- No crackles  -- Equal breath sounds bilaterally -- Breathing non-labored at rest Cardiovascular:  -- S1, S2 present  -- No pericardial rubs Gastrointestinal:  -- Abdomen soft, nontender, non-distended, no guarding or rebound tenderness -- No abdominal masses appreciated, pulsatile or otherwise  Musculoskeletal and Integumentary:  -- Wounds: Bilateral hip large ulcers with adequate granulation tissue.  There is also a large sacral ulcer with likely granulation tissue. -- Extremities: Paraplegic, no edema  Neurologic:  -- Motor function: intact and symmetric -- Sensation: Decreased in lower extremities    Left hip;    Sacral ulcer:    Right hip:     Labs:  CBC Latest Ref Rng & Units 07/03/2021 06/29/2021 06/25/2021  WBC 4.0 - 10.5 K/uL 5.5 5.6 5.0  Hemoglobin 13.0 - 17.0 g/dL 9.1(L) 9.2(L) 9.3(L)  Hematocrit 39.0 - 52.0 % 29.6(L) 31.8(L) 33.2(L)  Platelets 150 - 400 K/uL 361 331 344   CMP Latest Ref Rng & Units 07/02/2021 06/29/2021 06/25/2021  Glucose 70 - 99 mg/dL 100(H) 99 72  BUN 6 - 20 mg/dL 11 11 10   Creatinine 0.61 - 1.24 mg/dL 0.61 0.41(L) 0.62  Sodium 135 - 145 mmol/L 135 137 140  Potassium 3.5 - 5.1 mmol/L 3.6 4.2 3.8  Chloride 98 - 111 mmol/L 103 103 103  CO2 22 - 32 mmol/L 24 25 22   Calcium 8.9 - 10.3 mg/dL 8.1(L) 8.4(L) 8.6(L)  Total Protein 6.5 - 8.1 g/dL - 6.3(L) 6.5  Total Bilirubin 0.3 - 1.2 mg/dL - 0.4 0.3  Alkaline Phos 38 - 126 U/L - 114 113  AST 15 - 41 U/L - 15 21  ALT 0 - 44 U/L - 13 16   Imaging studies:  EXAM: CT ABDOMEN AND PELVIS WITHOUT CONTRAST   TECHNIQUE: Multidetector CT imaging of the abdomen and pelvis was performed following the standard protocol without IV contrast.   RADIATION DOSE REDUCTION: This exam was performed according to the departmental dose-optimization program which includes automated exposure control, adjustment of the mA  and/or kV according to patient size and/or use of iterative reconstruction technique.   COMPARISON:  05/13/2021   FINDINGS: Lower chest: Visualized lower lung fields are clear.   Hepatobiliary: Liver measures 19.3 cm in length. No focal abnormality is seen. Gallbladder is unremarkable.   Pancreas: No focal abnormality is seen.   Spleen: Spleen is enlarged measuring 16.9 cm in maximum diameter.   Adrenals/Urinary Tract: Adrenals are unremarkable. There is no hydronephrosis. There are no renal or ureteral stones. Suprapubic cystostomy catheter is noted in place.   Stomach/Bowel: Stomach is not distended. Small bowel loops are not dilated. Appendix is not dilated. There is no significant wall thickening in colon. There is no pericolic stranding.   Vascular/Lymphatic: Arterial calcifications are seen.   Reproductive: Unremarkable.   Other: There is no ascites or pneumoperitoneum. 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 There numerous coarse calcifications in the soft tissues around the right hip. There are pockets of air in the soft tissues in the and around the right hip joint. There is defect in the skin posterior to the right hip suggesting decubitus ulcer. There is also a decubitus ulcer posterior to the greater trochanter of left femur. There is 3.5 x 1.5 cm fluid density structure with thick margins along the anterior aspect of left hip. There is thinning of iliac bone immediately lateral to the SI joint.   Musculoskeletal: There is chronic dislocation in the right hip. There are numerous coarse calcifications in the soft tissues in and around the right hip.   IMPRESSION: There is no evidence of intestinal obstruction or pneumoperitoneum. There is no hydronephrosis. Appendix is not dilated.   There is soft tissue defect posterior to the greater tuberosity of left proximal femur suggesting decubitus ulcer. There are few tiny pockets of air in the soft tissues  posterior to the left hip. There is 3.5 x 1.5 cm fluid collection with thick wall along the anterior aspect of neck of the left femur, possibly serous effusion or septic arthritis.   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 There is decubitus ulcer posterior to the right hip. There is abnormal soft tissue  attenuation along with numerous pockets of air in the soft tissues around the right hip, more so in the anterior aspect. Possibility of septic arthritis and osteomyelitis in the right hip is not excluded. Follow-up MRI as clinically warranted should be considered.   Other findings as described in the body of the report.     Electronically Signed   By: Elmer Picker M.D.   On: 06/25/2021 14:12  Assessment/Plan:  46 y.o. 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  Patient with chronic sacral and bilateral decubitus ulcers.  As per last ED, that I personally reviewed on 06/25/2021 there is chronic osteomyelitis with chronic dislocation of the right hip.  -From the soft tissue standpoint there is no necrotic tissue, there is no purulence.  There is no indication for any surgical debridement at this moment  -This is a very unfortunate case for further evaluation of the chronic osteomyelitis of the sacral and hip joints with chronic dislocation will need to be addressed if there is any treatment that would benefit this patient.  I will recommend orthopedic surgery evaluation to give their input to see what the patient options.  -General surgery does not have anything to offer to this patient.  If any invasive/aggressive treatment will be needed by this patient will be between orthopedic surgery and plastic surgery.   This consult encounter was for 60 minutes most of the time evaluating the chart, evaluation of previous images,  evaluation of labs, evaluation of the nail by the infectious disease doctor seen today, discussion with hospitalist, counseling the patient and coordinating plan of care.  Arnold Long, MD

## 2021-07-09 NOTE — Consult Note (Signed)
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 Surgery has seen, please refer to note from Dr. Windell Moment earlier today.  ?These wounds exceed the scope of [facility] Nursing practice. Surgery has deferred to Orthopedics and/or Plastic Surgery. ? ?Wound type:Pressure, infection ?Pressure Injury POA: Yes ? ?Dressing procedure/placement/frequency: .  ? ?I have provided the patient with a mattress replacement with low air loss feature. I have provided Nursing with guidance for twice daily and PRN soiling normal saline moistened gauze dressings to the wounds. ? ?[place] nursing team will not follow, but will remain available to this patient, the nursing and medical teams.  Please re-consult if needed. ? ?Thanks, ?Maudie Flakes, MSN, RN, [place], [place], CWON-AP, [place]  ?Pager# [phone]  ? ? ? ?  ?

## 2021-07-09 NOTE — Consult Note (Signed)
Pharmacy Antibiotic Note ? ?Jonathon York is a 46 y.o. male admitted on 07/09/2021 with  chronic osteomyelitis of right hip .  Pharmacy has been consulted for meropenem and vancomycin dosing. ? ?Plan: ?Continue meropenem IV every 8 hours  ?Vancomcyin 1000mg  IV x1 then 1250mg  IV q24h ?Goal AUC 400-550  ?Est AUC: 523.2 ?Est Cmax: 44.7 ?Est Cmin: 9.4 ?Calculated with SCr 0.8 mg/dL ? ? ?Weight: 47.9 kg (105 lb 9.6 oz) ? ?Temp (24hrs), Avg:99.5 ?F (37.5 ?C), Min:99.5 ?F (37.5 ?C), Max:99.5 ?F (37.5 ?C) ? ?Recent Labs  ?Lab 07/03/21 ?1200  ?WBC 5.5  ?  ?Estimated Creatinine Clearance: 78.2 mL/min (by C-G formula based on SCr of 0.61 mg/dL).   ? ?Allergies  ?Allergen Reactions  ? [place] Fruit [Citrus] Other (See Comments)  ?  Blisters only from [place]!!! Patient is not allergic from all citrus  ? Sulfa Antibiotics   ?  Throwing up blood   ? ? ?Antimicrobials this admission: ?PTA Meropenem >>  ?3/13 Vancomycin >>  ? ?Dose adjustments this admission: ? ? ?Microbiology results: ? ?03/21/21 Blood Cultures: Streptococcus anginosis ? ? ?03/27/21 Wound Cultures: Pseudomonas Aeruginosa ? ? ? ?Thank you for allowing pharmacy to be a part of this patient?s care. ? ?03/23/21, PharmD, MS PGPM ?Clinical Pharmacist ?07/09/2021 ?5:44 PM ? ? ?

## 2021-07-09 NOTE — Progress Notes (Signed)
?   07/09/21 1909  ?Assess: MEWS Score  ?Temp 100.3 ?F (37.9 ?C)  ?BP (!) 105/54  ?Pulse Rate (!) 120  ?Resp 20  ?SpO2 100 %  ?O2 Device Room Air  ?Assess: MEWS Score  ?MEWS Temp 0  ?MEWS Systolic 0  ?MEWS Pulse 2  ?MEWS RR 0  ?MEWS LOC 0  ?MEWS Score 2  ?MEWS Score Color Yellow  ?Assess: if the MEWS score is Yellow or Red  ?Were vital signs taken at a resting state? Yes  ?Focused Assessment No change from prior assessment  ?Does the patient meet 2 or more of the SIRS criteria? No  ?MEWS guidelines implemented *See Row Information* Yes  ?Treat  ?MEWS Interventions Administered prn meds/treatments  ?Pain Scale 0-10  ?Pain Score 6  ?Pain Type Chronic pain  ?Pain Location Sacrum  ?Pain Orientation Right;Anterior  ?Pain Descriptors / Indicators Aching  ?Pain Frequency Constant  ?Pain Onset On-going  ?Patients Stated Pain Goal 2  ?Pain Intervention(s) Repositioned  ?Take Vital Signs  ?Increase Vital Sign Frequency  Yellow: Q 2hr X 2 then Q 4hr X 2, if remains yellow, continue Q 4hrs  ?Escalate  ?MEWS: Escalate Yellow: discuss with charge nurse/RN and consider discussing with provider and RRT  ?Notify: Charge Nurse/RN  ?Name of Charge Nurse/RN Notified [place] RN  ?Date Charge Nurse/RN Notified 07/09/21  ?Time Charge Nurse/RN Notified 1909  ?Document  ?Patient Outcome Other (Comment) ?(HR still elevated, will continue to monitor pt V/S.)  ?Assess: SIRS CRITERIA  ?SIRS Temperature  0  ?SIRS Pulse 1  ?SIRS Respirations  0  ?SIRS WBC 0  ?SIRS Score Sum  1  ? ?Pt has ongoing bolus and tylenol given for temp. Pt alert and oriented. Will cont to monitor pt. ?

## 2021-07-09 NOTE — Progress Notes (Unsigned)
{  [facility]_TRH_[id number]} 

## 2021-07-09 NOTE — H&P (Signed)
History and Physical    Jonathon York NKN:[id number] DOB: April 02, 1976 DOA: 07/09/2021  Referring MD/NP/PA:   PCP: Leonel Ramsay, MD   Patient coming from:  The patient is coming from home.  At baseline, pt is independent for most of ADL.        Chief Complaint: Sacral ulcer with infection  HPI: DASHIEL BERGQUIST is a 46 y.o. male with medical history significant of paraplegia, neurogenic bladder, s/p for suprapubic catheter, chronic severe sacral ulcer, anemia, malnutrition, anxiety, UTI, chronic right hip dislocation by CT scan on 06/25/2021, who presents with sacral ulcer with infection.  Patient is directly admitted from infectious disease, Dr. Gretta Began.  Patient has chronic severe sacral ulcer with possible osteomyelitis.  Patient has been following up with infectious disease. His sacral ulcer has been worsening recently.  Patient reports constant pain, moderate to severe, sharp, nonradiating.  Patient also has subjective fever and chills.  His body temperature is 99.5 today.  Patient has burning-like pain when he swallowing foods.  No shortness of breath.  Has mild dry cough.  Patient has diarrhea in the past 2 days.  No abdominal pain, nausea or vomiting.     Data Reviewed: pt was found to have blood pressure 106/60, heart rate 110-120s, RR 16, oxygen saturation 98% on room air, body temperature 99.5, pending CBC and BMP.  Pending COVID PCR.  Patient is admitted to [facility] as inpatient.  Dr. Peyton Najjar of surgery is consulted.   EKG: Not done in ED, will get one.     Review of Systems:   General: has fevers, chills, no body weight gain, has poor appetite, has fatigue HEENT: no blurry vision, hearing changes or sore throat Respiratory: no dyspnea, has coughing, no wheezing CV: no chest pain, no palpitations GI: no nausea, vomiting, abdominal pain, diarrhea, constipation GU: no dysuria, burning on urination, increased urinary frequency, hematuria  Ext: no leg edema Neuro:  no vision change or hearing loss.  Has paraplegia Skin: Has severe sacral ulcer MSK: No muscle spasm, no deformity, no limitation of range of movement in spin Heme: No easy bruising.  Travel history: No recent long distant travel.   Allergy:  Allergies  Allergen Reactions   Orange Fruit [Citrus] Other (See Comments)    Blisters only from [place]!!! Patient is not allergic from all citrus   Sulfa Antibiotics     Throwing up blood     Past Medical History:  Diagnosis Date   AKI (acute kidney injury) ([place])    Anxiety    Decubitus ulcer    Depression    Neurogenic bladder    Osteomyelitis ([place])    Paraparesis of both lower limbs ([place]) 02/21/00    Past Surgical History:  Procedure Laterality Date   AMPUTATION Right 03/19/2018   Procedure: 5th Metatarsal Resection;  Surgeon: Albertine Patricia, DPM;  Location: [facility] ORS;  Service: Podiatry;  Laterality: Right;   AMPUTATION TOE Right 04/29/2015   Procedure: AMPUTATION TOE;  Surgeon: Sharlotte Alamo, MD;  Location: [facility] ORS;  Service: Podiatry;  Laterality: Right;   INCISION AND DRAINAGE ABSCESS N/A 03/24/2021   Procedure: INCISION AND DRAINAGE STAGE IV DECUBITUS ULCER;  Surgeon: Herbert Pun, MD;  Location: [facility] ORS;  Service: General;  Laterality: N/A;   IRRIGATION AND DEBRIDEMENT BUTTOCKS     TOE AMPUTATION Right     Social History:  reports that he quit smoking about 5 years ago. His smoking use included cigarettes. He smoked an average of .5 packs per  day. He has never used smokeless tobacco. He reports current drug use. Drug: Marijuana. He reports that he does not drink alcohol.  Family History:  Family History  Problem Relation Age of Onset   Lymphoma Sister      Prior to Admission medications   Medication Sig Start Date End Date Taking? Authorizing Provider  Chlorhexidine Gluconate Cloth 2 % PADS Apply 6 each topically daily. 04/03/21   Terrilee Croak, MD  collagenase (SANTYL) ointment Apply topically 2 (two) times  daily. 04/02/21   Terrilee Croak, MD  feeding supplement (ENSURE ENLIVE / ENSURE PLUS) LIQD Take 237 mLs by mouth 3 (three) times daily between meals. 04/02/21   Terrilee Croak, MD  HYDROcodone-acetaminophen (NORCO/VICODIN) 5-325 MG tablet Take 1-2 tablets by mouth every 6 (six) hours as needed (breakthrough pain). 04/02/21   Terrilee Croak, MD  HYDROmorphone (DILAUDID) 2 MG tablet Take 0.5 tablets (1 mg total) by mouth every 6 (six) hours as needed for moderate pain. 04/02/21   Terrilee Croak, MD  magic mouthwash w/lidocaine SOLN Take 2 mLs by mouth 3 (three) times daily as needed for mouth pain. 04/02/21   Dahal, Marlowe Aschoff, MD  meropenem 1 g in sodium chloride 0.9 % 100 mL Inject 1 g into the vein every 8 (eight) hours. 04/02/21   Terrilee Croak, MD  multivitamin-lutein (OCUVITE-LUTEIN) CAPS capsule Take 1 capsule by mouth daily. 04/03/21   Dahal, Marlowe Aschoff, MD  polyethylene glycol (MIRALAX / GLYCOLAX) 17 g packet Take 17 g by mouth daily as needed for moderate constipation. 04/02/21   Terrilee Croak, MD  traZODone (DESYREL) 50 MG tablet Take 1 tablet (50 mg total) by mouth at bedtime as needed for sleep. 04/02/21   Terrilee Croak, MD  valACYclovir (VALTREX) 500 MG tablet Take 1 tablet (500 mg total) by mouth 2 (two) times daily. 04/02/21   Terrilee Croak, MD    Physical Exam: Vitals:   07/09/21 1646  BP: 101/61  Pulse: (!) 127  Resp: 20  Temp: 99.5 F (37.5 C)  TempSrc: Oral  SpO2: 99%  Weight: 47.9 kg   General: Not in acute distress HEENT:       Eyes: PERRL, EOMI, no scleral icterus.       ENT: No discharge from the ears and nose, no pharynx injection, no tonsillar enlargement.        Neck: No JVD, no bruit, no mass felt. Heme: No neck lymph node enlargement. Cardiac: S1/S2, RRR, No murmurs, No gallops or rubs. Respiratory: No rales, wheezing, rhonchi or rubs. GI: Soft, nondistended, nontender, no rebound pain, no organomegaly, BS present. GU: No hematuria Ext: No pitting leg edema bilaterally.  1+DP/PT pulse bilaterally. Musculoskeletal: No joint deformities, No joint redness or warmth, no limitation of ROM in spin. Skin: Has severe sacral ulcer, stage IV. I copped picture which is taken by Dr. Peyton Najjar            Neuro: Alert, oriented X3, cranial nerves II-XII grossly intact, moves all extremities normally.  Psych: Patient is not psychotic, no suicidal or hemocidal ideation.  Labs on Admission: I have personally reviewed following labs and imaging studies  CBC: Recent Labs  Lab 07/03/21 1200  WBC 5.5  HGB 9.1*  HCT 29.6*  MCV 75.9*  PLT 299   Basic Metabolic Panel: No results for input(s): NA, K, CL, CO2, GLUCOSE, BUN, CREATININE, CALCIUM, MG, PHOS in the last 168 hours. GFR: Estimated Creatinine Clearance: 78.2 mL/min (by C-G formula based on SCr of 0.61 mg/dL). Liver Function Tests: No  results for input(s): AST, ALT, ALKPHOS, BILITOT, PROT, ALBUMIN in the last 168 hours. No results for input(s): LIPASE, AMYLASE in the last 168 hours. No results for input(s): AMMONIA in the last 168 hours. Coagulation Profile: No results for input(s): INR, PROTIME in the last 168 hours. Cardiac Enzymes: No results for input(s): CKTOTAL, CKMB, CKMBINDEX, TROPONINI in the last 168 hours. BNP (last 3 results) No results for input(s): PROBNP in the last 8760 hours. HbA1C: No results for input(s): HGBA1C in the last 72 hours. CBG: No results for input(s): GLUCAP in the last 168 hours. Lipid Profile: No results for input(s): CHOL, HDL, LDLCALC, TRIG, CHOLHDL, LDLDIRECT in the last 72 hours. Thyroid Function Tests: No results for input(s): TSH, T4TOTAL, FREET4, T3FREE, THYROIDAB in the last 72 hours. Anemia Panel: No results for input(s): VITAMINB12, FOLATE, FERRITIN, TIBC, IRON, RETICCTPCT in the last 72 hours. Urine analysis:    Component Value Date/Time   COLORURINE YELLOW 05/23/2021 1750   APPEARANCEUR HAZY (A) 05/23/2021 1750   APPEARANCEUR Turbid 12/24/2013 0313    LABSPEC 1.023 05/23/2021 1750   LABSPEC 1.021 12/24/2013 0313   PHURINE 5.0 05/23/2021 1750   GLUCOSEU NEGATIVE 05/23/2021 1750   GLUCOSEU Negative 12/24/2013 0313   HGBUR NEGATIVE 05/23/2021 1750   BILIRUBINUR NEGATIVE 05/23/2021 1750   BILIRUBINUR Negative 12/24/2013 0313   KETONESUR NEGATIVE 05/23/2021 1750   PROTEINUR NEGATIVE 05/23/2021 1750   NITRITE NEGATIVE 05/23/2021 1750   LEUKOCYTESUR SMALL (A) 05/23/2021 1750   LEUKOCYTESUR 1+ 12/24/2013 0313   Sepsis Labs: @LABRCNTIP (procalcitonin:4,lacticidven:4) )No results found for this or any previous visit (from the past 240 hour(s)).   Radiological Exams on Admission: No results found.    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  Dr.Dr. Gretta Began of ID recommended to start patient on meropenem, I will also add vancomycin.  Consulted general surgery, Dr. Peyton Najjar  - Admitted to MedSurg bed as inpatient - Empiric antimicrobial treatment with vancomycin and meropenem - PRN Zofran for nausea, Tylenol and Percocet for pain - Blood cultures x 2  - ESR and CRP - wound care consult - IVF: 1.5 L of NS bolus   Paraplegia and neurogenic bladder: -s/p of suprapubic catheter -Fall precaution  Protein-calorie malnutrition, severe -Nutrition supplement -Consult nutrition  Microcytic anemia: Recent hemoglobin 9.1 07/03/2021 -Follow-up CBC  GERD (gastroesophageal reflux disease): Patient reports burning-like pain when he is swallowing food, likely due to acid reflux -Start Protonix 40 mg daily  Diarrhea: -Check C. difficile        DVT ppx: SQ Heparin      Code Status: Full code  Family Communication: not done, no family member is at bed side.    Disposition Plan:  Anticipate discharge back to previous environment  Consults called: Dr. Peyton Najjar of general surgery  Admission status and Level of care: Med-Surg:     as inpt       Severity of Illness:  The appropriate patient status for this patient is INPATIENT. Inpatient status is judged to be reasonable and necessary in order to provide the required intensity of service to ensure the patient's safety. The patient's presenting symptoms, physical exam findings, and initial radiographic and laboratory  data in the context of their chronic comorbidities is felt to place them at high risk for further clinical deterioration. Furthermore, it is not anticipated that the patient will be medically stable for discharge from the hospital within 2 midnights of admission.   * I certify that at the point of admission it is my clinical judgment that the patient will require inpatient hospital care spanning beyond 2 midnights from the point of admission due to high intensity of service, high risk for further deterioration and high frequency of surveillance required.*       Date of Service 07/09/2021    Ivor Costa [facility]   If 7PM-7AM, please contact night-coverage [email or web address] 07/09/2021, 6:40 PM

## 2021-07-10 ENCOUNTER — Inpatient Hospital Stay
Admission: AD | Admit: 2021-07-10 | Discharge: 2021-07-10 | Disposition: A | Discharge status: Home / Self Care | Payer: Medicare HMO | Source: Ambulatory Visit | Attending: Infectious Disease | Admitting: Infectious Disease

## 2021-07-10 ENCOUNTER — Inpatient Hospital Stay: Payer: Medicare HMO

## 2021-07-10 DIAGNOSIS — M86651 Other chronic osteomyelitis, right thigh: Secondary | ICD-10-CM

## 2021-07-10 DIAGNOSIS — B952 Enterococcus as the cause of diseases classified elsewhere: Secondary | ICD-10-CM

## 2021-07-10 DIAGNOSIS — Z8619 Personal history of other infectious and parasitic diseases: Secondary | ICD-10-CM

## 2021-07-10 DIAGNOSIS — K219 Gastro-esophageal reflux disease without esophagitis: Secondary | ICD-10-CM | POA: Diagnosis not present

## 2021-07-10 DIAGNOSIS — Z9359 Other cystostomy status: Secondary | ICD-10-CM

## 2021-07-10 DIAGNOSIS — L89154 Pressure ulcer of sacral region, stage 4: Secondary | ICD-10-CM | POA: Diagnosis not present

## 2021-07-10 DIAGNOSIS — R7881 Bacteremia: Secondary | ICD-10-CM | POA: Diagnosis not present

## 2021-07-10 DIAGNOSIS — M86652 Other chronic osteomyelitis, left thigh: Secondary | ICD-10-CM

## 2021-07-10 LAB — BLOOD CULTURE ID PANEL (REFLEXED) - BCID2

## 2021-07-10 LAB — BASIC METABOLIC PANEL
Anion gap: 6 (ref 5–15)
BUN: 6 mg/dL (ref 6–20)
CO2: 22 mmol/L (ref 22–32)
Calcium: 7.5 mg/dL — ABNORMAL LOW (ref 8.9–10.3)
Chloride: 110 mmol/L (ref 98–111)
Creatinine, Ser: 0.4 mg/dL — ABNORMAL LOW (ref 0.61–1.24)
GFR, Estimated: >60 mL/min (ref >60)
Glucose, Bld: 97 mg/dL (ref 70–99)
Potassium: 2.9 mmol/L — ABNORMAL LOW (ref 3.5–5.1)
Sodium: 138 mmol/L (ref 135–145)

## 2021-07-10 LAB — CBC
HCT: 20.6 % — ABNORMAL LOW (ref 39.0–52.0)
Hemoglobin: 5.9 g/dL — ABNORMAL LOW (ref 13.0–17.0)
MCH: 21.4 pg — ABNORMAL LOW (ref 26.0–34.0)
MCHC: 28.6 g/dL — ABNORMAL LOW (ref 30.0–36.0)
MCV: 74.6 fL — ABNORMAL LOW (ref 80.0–100.0)
Platelets: 235 10*3/uL (ref 150–400)
RBC: 2.76 MIL/uL — ABNORMAL LOW (ref 4.22–5.81)
RDW: 16.5 % — ABNORMAL HIGH (ref 11.5–15.5)
WBC: 3.6 10*3/uL — ABNORMAL LOW (ref 4.0–10.5)
nRBC: 0 % (ref 0.0–0.2)

## 2021-07-10 LAB — HEMOGLOBIN AND HEMATOCRIT, BLOOD
HCT: 24.9 % — ABNORMAL LOW (ref 39.0–52.0)
Hemoglobin: 7.5 g/dL — ABNORMAL LOW (ref 13.0–17.0)

## 2021-07-10 LAB — PREPARE RBC (CROSSMATCH)

## 2021-07-10 MED ORDER — POTASSIUM CHLORIDE CRYS ER 20 MEQ PO TBCR
40.0000 meq | EXTENDED_RELEASE_TABLET | Freq: Two times a day (BID) | ORAL | Status: DC
  Filled 2021-07-10: qty 2

## 2021-07-10 MED ORDER — SODIUM CHLORIDE 0.9% IV SOLUTION: Freq: Once | INTRAVENOUS | Status: AC

## 2021-07-10 MED ORDER — MORPHINE SULFATE (PF) 2 MG/ML IV SOLN
2.0000 mg | INTRAVENOUS | Status: DC | PRN
  Administered 2021-07-10 – 2021-07-23 (×56): 2 mg via INTRAVENOUS
  Filled 2021-07-10 (×56): qty 1

## 2021-07-10 MED ORDER — ENSURE MAX PROTEIN PO LIQD
11.0000 [oz_av] | Freq: Two times a day (BID) | ORAL | Status: DC
  Administered 2021-07-11 – 2021-07-15 (×4): 11 [oz_av] via ORAL
  Filled 2021-07-10: qty 330

## 2021-07-10 MED ORDER — POTASSIUM CHLORIDE 20 MEQ PO PACK
40.0000 meq | PACK | Freq: Two times a day (BID) | ORAL | Status: AC
  Filled 2021-07-10: qty 2

## 2021-07-10 MED ORDER — POTASSIUM CHLORIDE 10 MEQ/100ML IV SOLN
10.0000 meq | INTRAVENOUS | Status: AC
  Administered 2021-07-10 (×4): 10 meq via INTRAVENOUS
  Filled 2021-07-10: qty 100

## 2021-07-10 MED ORDER — JUVEN PO PACK
1.0000 | PACK | Freq: Two times a day (BID) | ORAL | Status: DC
  Administered 2021-07-11 (×2): 1 via ORAL

## 2021-07-10 MED ORDER — AMPICILLIN-SULBACTAM SODIUM 3 (2-1) G IJ SOLR
3.0000 g | Freq: Four times a day (QID) | INTRAMUSCULAR | Status: DC
  Administered 2021-07-10 – 2021-07-20 (×40): 3 g via INTRAVENOUS
  Filled 2021-07-10 (×5): qty 8
  Filled 2021-07-10 (×2): qty 3
  Filled 2021-07-10: qty 8
  Filled 2021-07-10 (×2): qty 3
  Filled 2021-07-10: qty 8
  Filled 2021-07-10 (×2): qty 3
  Filled 2021-07-10 (×2): qty 8
  Filled 2021-07-10: qty 3
  Filled 2021-07-10: qty 8
  Filled 2021-07-10: qty 3
  Filled 2021-07-10 (×3): qty 8
  Filled 2021-07-10: qty 3
  Filled 2021-07-10: qty 8
  Filled 2021-07-10: qty 3
  Filled 2021-07-10: qty 8
  Filled 2021-07-10: qty 3
  Filled 2021-07-10 (×5): qty 8
  Filled 2021-07-10: qty 3
  Filled 2021-07-10 (×6): qty 8
  Filled 2021-07-10: qty 3
  Filled 2021-07-10 (×2): qty 8
  Filled 2021-07-10: qty 3

## 2021-07-10 NOTE — Care Management (Signed)
Staff message sent to Dr. Lajoyce Corners, orthopedic surgery [facility] to review.  Greatly appreciate his review of the case.  Per Dr. Lajoyce Corners the sacral ulcers are extensive and beyond his scope of practice.  He stated he sent the images to [facility] for review. ? ?Ultimately this patient may require evaluation at a tertiary care facility that can provide multidisciplinary approach.  At this point it does not appear that [facility] would be able to address his surgical concerns. ? ?Hospital to hospital transfer will be quite difficult.  This patient's functional status is quite poor and needs medical optimization as well as nutritional optimization prior to undergoing any extensive surgical intervention.  If [facility] is able to intervene we may be able to transfer patient there however in the absence of that I feel the best approach at this time may be to optimize medically as much as possible which includes intravenous antibiotics, nutritional optimization with RD consultation and perhaps discharge to long-term care facility versus skilled nursing facility with subsequent outpatient referral to tertiary care surgical center. ? ?Lolita Patella MD ?

## 2021-07-10 NOTE — Plan of Care (Signed)
?  Problem: Clinical Measurements: ?Goal: Ability to maintain clinical measurements within normal limits will improve ?Outcome: Progressing ?Goal: Respiratory complications will improve ?Outcome: Progressing ?Goal: Cardiovascular complication will be avoided ?Outcome: Progressing ?  ?Problem: Pain Managment: ?Goal: General experience of comfort will improve ?Outcome: Progressing ?  ?Pt is alert and orientedx4. V/S stable except bp 90/63 (map-72). Complained of pain on his sacral wounds; oxycodone  given this shift. Dressing changed 1x last night but refused this morning. Pt wants to sleep and will call when ready. Awaiting for mattress with low air feature. ?

## 2021-07-10 NOTE — Progress Notes (Signed)
Orthopedic Note ? ?A non-urgent consult was placed to orthopedics regarding this patient. ? ?Upon quick review of recent notes, it appears that this patient has a complex problem. ? 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 ? ?I agree with Dr. Daiva Eves, infectious disease. Patient would likely need a significant bony resection procedure (girdlestone procedure) to allow for resolution of his chronic ulcers/osteomyelitis. This type of care would be best coordinated at a tertiary care center that can provide the multidisciplinary approach (general surgery, orthopedics, plastic surgery, infectious disease, palliative care) necessary for optimal patient care. ? ?Full consult note will be written tomorrow after history and physical. ? ? ?Jonathon York ? ?

## 2021-07-10 NOTE — TOC CM/SW Note (Addendum)
Patient is active with [facility] for RN and OT. ? ?Charlynn Court, CSW ?[phone] ? ?4:37 pm: Per [facility] representative, they did not officially open his case because he had to come to the hospital on first visit. This was about 48 hours after discharge from [facility] and he had not gotten his IV abx. Called [facility] representative and she confirmed they were set up with him. Patient got home and did not have power/running water. Box of abx supplies were still on the front porch.  ? ?Charlynn Court, CSW ?[phone] ? ?

## 2021-07-10 NOTE — Progress Notes (Addendum)
Initial Nutrition Assessment ? ?DOCUMENTATION CODES:  ? ?Non-severe (moderate) malnutrition in context of chronic illness ? ?INTERVENTION:  ? ?Ensure Max protein supplement BID, each supplement provides 150kcal and 30g of protein. ? ?Ocuvite daily for wound healing (provides zinc, vitamin A, vitamin C, Vitamin E, copper, and selenium) ? ?Juven Fruit Punch BID, each serving provides 95kcal and 2.5g of protein (amino acids glutamine and arginine) ? ?Pt at high moderate risk; recommend monitor potassium, magnesium and phosphorus labs daily until stable ? ?Check vitamin E, vitamin A, vitamin C, vitamin D, zinc and B12 labs.  ? ?NUTRITION DIAGNOSIS:  ? ?Moderate Malnutrition related to chronic illness (non healing sacral wound, paraplegia) as evidenced by moderate fat depletion, moderate muscle depletion, severe muscle depletion. ? ?GOAL:  ? ?Patient will meet greater than or equal to 90% of their needs ? ?MONITOR:  ? ?PO intake, Supplement acceptance, Labs, Weight trends, Skin, I & O's ? ?REASON FOR ASSESSMENT:  ? ?Consult ?Wound healing ? ?ASSESSMENT:  ? ?46 y.o. male with medical history significant of paraplegia, neurogenic bladder, s/p suprapubic catheter, chronic severe sacral ulcer, anemia, malnutrition, anxiety, UTI and chronic right hip dislocation by CT scan on 06/25/2021 who presents with sacral ulcer with infection and E. faecalis bacteremia. ? ?Met with pt in room today. Pt is well known to nutrition department and this RD from numerous previous admits. Pt reports a long hospital stay in November which required an ICU stay and transfer to [facility] where he has been residing since discharge. Pt reports decreased appetite and oral intake pta as he reports that the food at [facility] was not good. Pt reports that he has been drinking Ensure supplements and taking a daily MVI (looks like ocuvite per chart). Pt reports that his appetite is improving in hospital. Pt ate 85% of his lunch today which included  roast beef and mashed potatoes. Pt had an Ensure Max on his side table that is untouched but reports that he is planning to drink it later. RD discussed with pt the importance of adequate nutrition needed to preserve lean muscle and to support wound healing. RD will add supplements to help pt meet his estimated needs; pt prefers strawberry flavors. Pt reports that he is tired of taking pills but he is willing to drink Juven in hospital. Per chart, pt appears weight stable at baseline. Of note, pt noted to have a citrus allergy; pt reports that his allergy is only to oranges (causes mouth blisters) and he is able to have all other citrus.  ? ?Discussed nutritional status with MD. Depending on [place], pt may benefit from PEG tube placement to maximize nutritional status and to support wound healing. Will check vitamin labs to assess for deficiency that may be preventing wound healing.  ? ?Medications reviewed and include: ocuvite, protonix, KCl, unasyn  ? ?Labs reviewed: K 2.9(L), creat 0.40(L) ?Wbc- 3.6(L), Hgb 7.5(L), Hct 24.9(L), MCV 74.6(L), MCH 21.4(L), MCHC 28.6(L) ? ?NUTRITION - FOCUSED PHYSICAL EXAM: ? ?Flowsheet Row Most Recent Value  ?Orbital Region Moderate depletion  ?Upper Arm Region Moderate depletion  ?Thoracic and Lumbar Region Moderate depletion  ?Buccal Region Moderate depletion  ?Temple Region Moderate depletion  ?Clavicle Bone Region Moderate depletion  ?Clavicle and Acromion Bone Region Moderate depletion  ?Scapular Bone Region Moderate depletion  ?Dorsal Hand Moderate depletion  ?Patellar Region Severe depletion  ?Anterior Thigh Region Severe depletion  ?Posterior Calf Region Severe depletion  ?Edema (RD Assessment) None  ?Hair Reviewed  ?Eyes Reviewed  ?Mouth Reviewed  ?  Skin Reviewed  ?Nails Reviewed  ? ?Diet Order:   ?Diet Order   ? ?       ?  Diet regular Room service appropriate? Yes; Fluid consistency: Thin  Diet effective now       ?  ? ?  ?  ? ?  ? ?EDUCATION NEEDS:  ? ?Education needs have  been addressed ? ?Skin:  Skin Assessment: Reviewed RN Assessment (Stage 4 pressure injuries to the bilateral hips and sacrum with osteomyelitis and dislocation of hips.) ? ?Last BM:  3/14- type 7 ? ?Height:  ? ?Ht Readings from Last 1 Encounters:  ?07/10/21 5' 10"  (1.778 m)  ? ? ?Weight:  ? ?Wt Readings from Last 1 Encounters:  ?07/09/21 47.9 kg  ? ? ?Ideal Body Weight:  75.4 kg ? ?BMI:  Body mass index is 15.15 kg/m?. ? ?Estimated Nutritional Needs:  ? ?Kcal:  1900-2200kcal/day ? ?Protein:  95-110g/day ? ?Fluid:  1.5-1.7L/day ? ?Koleen Distance MS, RD, LDN ?Please refer to AMION for RD and/or RD on-call/weekend/after hours pager ? ?

## 2021-07-10 NOTE — Consult Note (Signed)
? ? ? ?Date of Admission:  07/09/2021    ?      ?Reason for Consult:  Extensive pelvic osteomyelitis with involvement of the sacrum/coccycx, rami and ischial tuberosities left troachanteric osteomyelitis with septic hip, right sided trochanteric osteomyelitis with dislocated hip, now also with E faecalis bacteremia ?   ?Referring Provider: [facility] and Phillips Odor, MD ? ? ?Assessment: ? ?Enterococcus faecalis bacteremia ?Dual-lumen catheter in place--he claims that his one of his last possible access sites ?Extensive pelvic osteomyelitis involving sacrum coccyx left and right trochanters as well as left-sided septic hip due to stage IV decubitus ulcers ?Paraplegia ?Genic bladder with suprapubic catheter ?History of Streptococcus anginosus bacteremia in November 2022 ?She had multidrug-resistant Pseudomonas isolated on wound cultures from debridements done at [facility] in November 2022 ?Loose stools from opiate withdrawal --resolved ? ?Plan: ? ?Will start unasyn ?Will order TTE ?Ideally would have central line holiday but we can try to "treat through the catheter" ?Repeat blood cultures tomorrow. ?He would need transfer to tertiary care center for further counseling for aggressive surgery such as a girdlestone procedure(s) --when I mentioned idea of hip dysarticulation he was upset and told me I was first person to mention this and that he was not going to lose his legs ?Consider palliative care consult but I think time for that may be AFTER he goes to tertiary care center (he mentions [facility] or [facility]) ?DC the enteric precautions and C diff testing. ? ?Principal Problem: ?  Sacral decubitus ulcer, stage IV ([place]) ?Active Problems: ?  Paraplegia ([place]) ?  Neurogenic bladder ?  Protein-calorie malnutrition, severe ?  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[place]) ?  Microcytic anemia ?  GERD (gastroesophageal reflux disease) ?  Diarrhea ? ? ?Scheduled Meds: ? Chlorhexidine Gluconate Cloth  6 each Topical  Daily  ? feeding supplement  237 mL Oral TID BM  ? multivitamin-lutein  1 capsule Oral Daily  ? pantoprazole  40 mg Oral Q1200  ? potassium chloride  40 mEq Oral BID  ? sodium chloride flush  10-40 mL Intracatheter Q12H  ? ?Continuous Infusions: ? ampicillin-sulbactam (UNASYN) IV    ? potassium chloride 10 mEq (07/10/21 1127)  ? ?PRN Meds:.acetaminophen, diphenhydrAMINE, loperamide, morphine injection, ondansetron (ZOFRAN) IV, oxyCODONE-acetaminophen, sodium chloride flush, traZODone ? ?HPI: Jonathon York is a 46 y.o. male with paraplegia and stage IV decubitus ulcers with extensive pelvic osteomyelitis as described below. ? ?He was admitted in November 2022 to [facility] and found to be bacteremic with Streptococcus anginosus. ? ? ?During that pulsation it was followed closely by Dr. Delaine Lame. Patient was initially he did with ceftriaxone.  He had surgical debridement of soft tissues with cultures from these tissues yielding a multidrug-resistant Pseudomonas that was only sensitive to gentamicin and meropenem. ? ? ? ?He has changed over to meropenem and ultimately discharged to the [facility] at [facility] in December.  ? ?He was seen by Dr. Barth Kirks with ID while at [facility] and appears to have been on protracted meropenem. ? ?Can see that MRI was ordered and May 30, 2021 which showed continued osteomyelitis of the greater "trochanter on the left with a moderate-sized joint effusion concerning for septic arthritis also with chronic right hip dislocation with right femoral head and neck osteomyelitis as well as osteomyelitis of the acetabulum with chronic destruction of the lower sacrum and coccyx and pubic rami and ischial tuberosities consistent with osteomyelitis. ? ?There is also a 1.4 x 1 cm fluid collection  along the posterior right upper thigh. ? ?He appears to have been sent home health and it appears with IV antibiotics I do not have access to the [facility] paperwork from  [facility]. ? ?Regardless he has a dual-lumen central line. ? ?He says that antibiotics were delivered to his house but he did not know how to administer them wound care who came to his house to care for him and felt that his wound care was too extensive for them to attend to. ? ?He then developed bloody drainage from one of his large decubitus ulcers and came in was evaluated by Dr. Ola Spurr. ? ?Patient was also having chills and appeared systemically ill was admitted to the hospital.  Blood cultures were taken 1 of which is now growing Enterococcus faecalis. ? ?His antibiotics were initially vancomycin and meropenem but we are de-escalating them to Unasyn in the hopes of this is ampicillin sensitive Enterococcus species. ? ?Daily he would need a central line holiday but he tells me that they will line that he currently has is one of his last options for IV access. ? ?We can therefore try to "treat through the catheter". ? ?I will obtain a 2D echocardiogram. ? ?With regards to his extensive osteomyelitis for cure he would need extensive surgery and would think at least a Girdlestone on one side if not bilaterally. ? ? ?I brought up the idea him that surgery that would be curative would involve him losing his legs he was adamantly against this. ? ?He still has interest in going to a tertiary care hospital for further surgical evaluation. ? ?I would suspect that such evaluation would also be with recommendations for Girdlestone hip dysarticulation or similarly morbid surgeries.  ? ? ?He claimed I was first person to mention need for this. ? ? ?He would benefit by seeing seen by palliative care but I think that would be more easily accomplished once he is seen in the tensor urgent care surgeons that are talked about and transfer I completley realize is not trivial in current atmosphere perhaps transfer to SNF or [facility] with referral to [facility]? ? ?I spent 122 minutes with the patient including than  50% of the time in face to face counseling of the patient he has bloodstream infections in the past current bloodstream infections and his extensive osteomyelitis personally reviewing the left hip and right hip from February 2023, prior cultures obtained new cultures along with review of medical records in preparation for the visit and during the visit and in coordination of his care. ? ? ? ? ? ? ? ?Review of Systems: ?Review of Systems  ?Constitutional:  Positive for fever. Negative for chills, malaise/fatigue and weight loss.  ?HENT:  Negative for congestion and sore throat.   ?Eyes:  Negative for blurred vision and photophobia.  ?Respiratory:  Negative for cough, shortness of breath and wheezing.   ?Cardiovascular:  Negative for chest pain, palpitations and leg swelling.  ?Gastrointestinal:  Negative for abdominal pain, blood in stool, constipation, diarrhea, heartburn, melena, nausea and vomiting.  ?Genitourinary:  Negative for dysuria, flank pain and hematuria.  ?Musculoskeletal:  Positive for myalgias. Negative for back pain, falls and joint pain.  ?Skin:  Negative for itching and rash.  ?Neurological:  Negative for dizziness, focal weakness, loss of consciousness, weakness and headaches.  ?Endo/Heme/Allergies:  Does not bruise/bleed easily.  ?Psychiatric/Behavioral:  Negative for depression and suicidal ideas. The patient does not have insomnia.   ? ?Past Medical History:  ?  Diagnosis Date  ? AKI (acute kidney injury) ([place])   ? Anxiety   ? Decubitus ulcer   ? Depression   ? Neurogenic bladder   ? Osteomyelitis ([place])   ? Paraparesis of both lower limbs ([place]) 02/21/00  ? ? ?Social History  ? ?Tobacco Use  ? Smoking status: Former  ?  Packs/day: 0.50  ?  Types: Cigarettes  ?  Quit date: 2018  ?  Years since quitting: 5.2  ? Smokeless tobacco: Never  ?Vaping Use  ? Vaping Use: Never used  ?Substance Use Topics  ? Alcohol use: No  ? 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 ? ?Family  History  ?Problem Relation Age of Onset  ? Lymphoma Sister   ? ?Allergies  ?Allergen Reactions  ? Orange Fruit [Citrus] Other (See Comments)  ?  Blisters only from [place]!!! Patient is not allergic from all citrus

## 2021-07-10 NOTE — Progress Notes (Signed)
*  PRELIMINARY RESULTS* ?Echocardiogram ?2D Echocardiogram has been performed. ? ?Jonathon York, [name] ?07/10/2021, 2:47 PM ?

## 2021-07-10 NOTE — Progress Notes (Signed)
PHARMACY - PHYSICIAN COMMUNICATION ?CRITICAL VALUE ALERT - BLOOD CULTURE IDENTIFICATION (BCID) ? ?Jonathon York is an 46 y.o. male who presented to [facility] on 07/09/2021 with a chief complaint of possible sacral ulcer infection, sent from Dr Jarrett Ables office ? ?Assessment:  3/13 blood cultures with GPC in 1 of 4 bottles, BCID E faecalis ? ?Name of physician (or Provider) Contacted: Dr Daiva Eves ? ?Current antibiotics: meropenem ? ?Changes to prescribed antibiotics recommended:  ?Recommendations accepted by provider - changed to ampicillin/sulbactam ? ?Results for orders placed or performed during the hospital encounter of 07/09/21  ?Blood Culture ID Panel (Reflexed) (Collected: 07/09/2021  6:56 PM)  ?Result Value Ref Range  ? Enterococcus faecalis DETECTED (A) NOT DETECTED  ? Enterococcus Faecium NOT DETECTED NOT DETECTED  ? Listeria monocytogenes NOT DETECTED NOT DETECTED  ? Staphylococcus species NOT DETECTED NOT DETECTED  ? Staphylococcus aureus (BCID) NOT DETECTED NOT DETECTED  ? Staphylococcus epidermidis NOT DETECTED NOT DETECTED  ? Staphylococcus lugdunensis NOT DETECTED NOT DETECTED  ? Streptococcus species NOT DETECTED NOT DETECTED  ? Streptococcus agalactiae NOT DETECTED NOT DETECTED  ? Streptococcus pneumoniae NOT DETECTED NOT DETECTED  ? Streptococcus pyogenes NOT DETECTED NOT DETECTED  ? A.calcoaceticus-baumannii NOT DETECTED NOT DETECTED  ? Bacteroides fragilis NOT DETECTED NOT DETECTED  ? Enterobacterales NOT DETECTED NOT DETECTED  ? Enterobacter cloacae complex NOT DETECTED NOT DETECTED  ? Escherichia coli NOT DETECTED NOT DETECTED  ? Klebsiella aerogenes NOT DETECTED NOT DETECTED  ? Klebsiella oxytoca NOT DETECTED NOT DETECTED  ? Klebsiella pneumoniae NOT DETECTED NOT DETECTED  ? Proteus species NOT DETECTED NOT DETECTED  ? Salmonella species NOT DETECTED NOT DETECTED  ? Serratia marcescens NOT DETECTED NOT DETECTED  ? Haemophilus influenzae NOT DETECTED NOT DETECTED  ? Neisseria meningitidis  NOT DETECTED NOT DETECTED  ? Pseudomonas aeruginosa NOT DETECTED NOT DETECTED  ? Stenotrophomonas maltophilia NOT DETECTED NOT DETECTED  ? Candida albicans NOT DETECTED NOT DETECTED  ? Candida auris NOT DETECTED NOT DETECTED  ? Candida glabrata NOT DETECTED NOT DETECTED  ? Candida krusei NOT DETECTED NOT DETECTED  ? Candida parapsilosis NOT DETECTED NOT DETECTED  ? Candida tropicalis NOT DETECTED NOT DETECTED  ? Cryptococcus neoformans/gattii NOT DETECTED NOT DETECTED  ? Vancomycin resistance NOT DETECTED NOT DETECTED  ? ?Juliette Alcide, PharmD, BCPS, BCIDP ?Work Cell: [phone] ?07/10/2021 11:32 AM ? ? ? ?

## 2021-07-10 NOTE — Progress Notes (Signed)
?PROGRESS NOTE ? ? ? ?Jonathon York  [id number] DOB: 04-26-1976 DOA: 07/09/2021 ?PCP: Leonel Ramsay, MD  ? ? ?Brief Narrative:  ?46 y.o. male with medical history significant of paraplegia, neurogenic bladder, s/p for suprapubic catheter, chronic severe sacral ulcer, anemia, malnutrition, anxiety, UTI, chronic right hip dislocation by CT scan on 06/25/2021, who presents with sacral ulcer with infection. ?  ?Patient is directly admitted from infectious disease, Dr. Gretta Began.  Patient has chronic severe sacral ulcer with possible osteomyelitis.  Patient has been following up with infectious disease. His sacral ulcer has been worsening recently.  Patient reports constant pain, moderate to severe, sharp, nonradiating.  Patient also has subjective fever and chills.  His body temperature is 99.5 today.  Patient has burning-like pain when he swallowing foods.  No shortness of breath.  Has mild dry cough.  Patient has diarrhea in the past 2 days.  No abdominal pain, nausea or vomiting.   ? ?General surgery consulted.  Case discussed at length with Dr. Windell Moment.  Surgical wounds do not show any signs of soft tissue necrosis or purulence/drainage.  No general surgery intervention warranted at this time.  Orthopedic surgery was engaged for comment but stated that their service cannot address the patient like this and recommend transfer to tertiary care.  I explained that transfer to tertiary care would be very difficult in the setting. ? ?Infectious disease also on consult.  Patient has blood culture positive for Enterococcus faecalis.  On IV antibiotic for this. ? ? ?Assessment & Plan: ?  ?Principal Problem: ?  Sacral decubitus ulcer, stage IV ([place]) ?Active Problems: ?  Paraplegia ([place]) ?  Neurogenic bladder ?  Protein-calorie malnutrition, severe ?  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[place]) ?  Microcytic anemia ?  GERD (gastroesophageal reflux disease) ?  Diarrhea ? ?Stage IV sacral decubitus ulcer ?Sacral  osteomyelitis ?Possibility of septic arthritis ?Chronic dislocation of left hip ?Very unfortunate case.  From general surgical standpoint no intervention warranted as soft tissue is not necrotic and there is no purulence from wound.  Orthopedic surgery has stated that they are unable to intervene in this case.  Patient ideally needs transfer to tertiary care center for multidisciplinary surgical evaluation.  Hospital-hospital transfer will be very difficult in this regard as patient is relatively stable.  May benefit from medical optimization, nutritional optimization and discharge to monitored care facility with outpatient referral to tertiary care center. ?Plan: ?Antimicrobial treatment for bacteremia as below ?As needed pain management ?Follow cultures ?Appreciate general surgery assistance in management ? ?Paraplegia ?Neurogenic bladder ?Suprapubic catheter in place ?Follow cultures ? ?E. faecalis bacteremia ?Infectious disease on consult ?On IV Unasyn ?TTE ordered ?Repeat blood cultures 3/15 ?Medical optimization and consideration for transfer to higher level of care versus outpatient referral ?Consider palliative care evaluation ? ?Severe protein calorie malnutrition ?RD consultation ?Nutritional supplements ?Patient's poor nutritional status will be a major barrier to any potential for surgical intervention.  This makes hospital to hospital transfer even less likely as his nutritional status really needs to be optimized before consideration for any surgical intervention.  As such we will not initiate transfer request at this time.  Pending RD evaluation and recommendations to optimize patient's nutritional status ? ?GERD ?Protonix ? ?Diarrhea ?C. difficile canceled by infectious disease ? ?DVT prophylaxis: SQ heparin ?Code Status: Full ?Family Communication: None ?Disposition Plan: Status is: Inpatient ?Remains inpatient appropriate because: Easy callus bacteremia.  Severe protein calorie  malnutrition. ? ? ?Level of care: Med-Surg ? ?Consultants:  ?General  surgery ?Orthopedic surgery ?Infectious disease ? ?Procedures:  ?None ? ?Antimicrobials: ?Unasyn ? ? ?Subjective: ?Seen and examined.  Appears frail.  Resting in bed.  No visible distress good ? ?Objective: ?Vitals:  ? 07/10/21 0732 07/10/21 0845 07/10/21 0915 07/10/21 1122  ?BP: (!) 91/59 (!) 91/57 95/62 92/60   ?Pulse: 97 97 87 85  ?Resp: 16 18 18 18   ?Temp: 98.6 ?F (37 ?C) 98 ?F (36.7 ?C) 98.2 ?F (36.8 ?C) 98 ?F (36.7 ?C)  ?TempSrc:  Oral Oral Oral  ?SpO2: 100% 100% 100% 100%  ?Weight:      ?Height:      ? ? ?Intake/Output Summary (Last 24 hours) at 07/10/2021 1432 ?Last data filed at 07/10/2021 1123 ?Gross per 24 hour  ?Intake 629.67 ml  ?Output --  ?Net 629.67 ml  ? ?Filed Weights  ? 07/09/21 1646  ?Weight: 47.9 kg  ? ? ?Examination: ? ?General exam: No acute distress.  Appears frail and chronically ill ?Respiratory system: Lungs clear.  Normal work of breathing.  Room air ?Cardiovascular system: S1-S2, RRR, no murmurs, no pedal edema ?Gastrointestinal system: Thin, soft, NT/ND, normal bowel sounds ?Central nervous system: Alert and oriented. No focal neurological deficits. ?Extremities: Severe bilateral lower extremity wasting ?Skin: Severe stage IV sacral ulcer.  Pictures in chart ?Psychiatry: Judgement and insight appear normal. Mood & affect flattened.  ? ? ? ?Data Reviewed: I have personally reviewed following labs and imaging studies ? ?CBC: ?Recent Labs  ?Lab 07/09/21 ?1856 07/10/21 ?[id number] 07/10/21 ?1236  ?WBC 6.8 3.6*  --   ?HGB 7.1* 5.9* 7.5*  ?HCT 24.1* 20.6* 24.9*  ?MCV 73.3* 74.6*  --   ?PLT 298 235  --   ? ?Basic Metabolic Panel: ?Recent Labs  ?Lab 07/09/21 ?1856 07/10/21 ?0541  ?NA 138 138  ?K 3.2* 2.9*  ?CL 107 110  ?CO2 22 22  ?GLUCOSE 112* 97  ?BUN 7 6  ?CREATININE 0.51* 0.40*  ?CALCIUM 7.4* 7.5*  ? ?GFR: ?Estimated Creatinine Clearance: 78.2 mL/min (A) (by C-G formula based on SCr of 0.4 mg/dL (L)). ?Liver Function Tests: ?No  results for input(s): AST, ALT, ALKPHOS, BILITOT, PROT, ALBUMIN in the last 168 hours. ?No results for input(s): LIPASE, AMYLASE in the last 168 hours. ?No results for input(s): AMMONIA in the last 168 hours. ?Coagulation Profile: ?Recent Labs  ?Lab 07/09/21 ?1856  ?INR 1.2  ? ?Cardiac Enzymes: ?No results for input(s): CKTOTAL, CKMB, CKMBINDEX, TROPONINI in the last 168 hours. ?BNP (last 3 results) ?No results for input(s): PROBNP in the last 8760 hours. ?HbA1C: ?No results for input(s): HGBA1C in the last 72 hours. ?CBG: ?No results for input(s): GLUCAP in the last 168 hours. ?Lipid Profile: ?No results for input(s): CHOL, HDL, LDLCALC, TRIG, CHOLHDL, LDLDIRECT in the last 72 hours. ?Thyroid Function Tests: ?No results for input(s): TSH, T4TOTAL, FREET4, T3FREE, THYROIDAB in the last 72 hours. ?Anemia Panel: ?No results for input(s): VITAMINB12, FOLATE, FERRITIN, TIBC, IRON, RETICCTPCT in the last 72 hours. ?Sepsis Labs: ?Recent Labs  ?Lab 07/09/21 ?1856 07/09/21 ?2158  ?LATICACIDVEN 1.7 1.8  ? ? ?Recent Results (from the past 240 hour(s))  ?CULTURE, BLOOD (ROUTINE X 2) w Reflex to ID Panel     Status: None (Preliminary result)  ? Collection Time: 07/09/21  6:56 PM  ? Specimen: BLOOD  ?Result Value Ref Range Status  ? Specimen Description BLOOD LEFT ASSIST CONTROL  Final  ? Special Requests   Final  ?  BOTTLES DRAWN AEROBIC AND ANAEROBIC Blood Culture adequate volume  ? Culture  Setup Time   Final  ?  GRAM POSITIVE COCCI ?AEROBIC BOTTLE ONLY ?Organism ID to follow ?CRITICAL RESULT CALLED TO, READ BACK BY AND VERIFIED WITHNilsa Nutting [facility] [id number] 07/10/21 HNM ?Performed at [facility], [address] [place], [place] [address] ?  ? Culture GRAM POSITIVE COCCI  Final  ? Report Status PENDING  Incomplete  ?CULTURE, BLOOD (ROUTINE X 2) w Reflex to ID Panel     Status: None (Preliminary result)  ? Collection Time: 07/09/21  6:56 PM  ? Specimen: BLOOD  ?Result Value Ref Range Status  ? Specimen  Description BLOOD LEFT HAND  Final  ? Special Requests   Final  ?  BOTTLES DRAWN AEROBIC AND ANAEROBIC Blood Culture adequate volume  ? Culture   Final  ?  NO GROWTH < 12 HOURS ?Performed at [facility], [place]

## 2021-07-11 ENCOUNTER — Encounter: Payer: Self-pay | Admitting: Internal Medicine

## 2021-07-11 ENCOUNTER — Ambulatory Visit: Payer: Medicare HMO | Admitting: Infectious Disease

## 2021-07-11 ENCOUNTER — Other Ambulatory Visit: Payer: Self-pay

## 2021-07-11 ENCOUNTER — Ambulatory Visit: Payer: Medicare HMO | Admitting: Internal Medicine

## 2021-07-11 DIAGNOSIS — Z960 Presence of urogenital implants: Secondary | ICD-10-CM

## 2021-07-11 DIAGNOSIS — R197 Diarrhea, unspecified: Secondary | ICD-10-CM

## 2021-07-11 DIAGNOSIS — N319 Neuromuscular dysfunction of bladder, unspecified: Secondary | ICD-10-CM

## 2021-07-11 DIAGNOSIS — Z9359 Other cystostomy status: Secondary | ICD-10-CM | POA: Diagnosis not present

## 2021-07-11 DIAGNOSIS — L89154 Pressure ulcer of sacral region, stage 4: Secondary | ICD-10-CM | POA: Diagnosis not present

## 2021-07-11 DIAGNOSIS — R7881 Bacteremia: Secondary | ICD-10-CM | POA: Diagnosis not present

## 2021-07-11 DIAGNOSIS — E43 Unspecified severe protein-calorie malnutrition: Secondary | ICD-10-CM | POA: Diagnosis not present

## 2021-07-11 LAB — TYPE AND SCREEN
ABO/RH(D): O POS
Antibody Screen: NEGATIVE
Unit division: 0

## 2021-07-11 LAB — ECHOCARDIOGRAM COMPLETE
Height: 70 in
S' Lateral: 3.2 cm
Weight: 1689.61 oz

## 2021-07-11 LAB — BPAM RBC
Blood Product Expiration Date: 202304112359
ISSUE DATE / TIME: 202303140852
Unit Type and Rh: 5100

## 2021-07-11 LAB — VITAMIN B12: Vitamin B-12: 162 pg/mL — ABNORMAL LOW (ref 180–914)

## 2021-07-11 LAB — VITAMIN D 25 HYDROXY (VIT D DEFICIENCY, FRACTURES): Vit D, 25-Hydroxy: 14 ng/mL — ABNORMAL LOW (ref 30–100)

## 2021-07-11 MED ORDER — VITAMIN B-12 1000 MCG PO TABS: 1000.0000 ug | ORAL_TABLET | Freq: Every day | ORAL | Status: DC

## 2021-07-11 NOTE — Plan of Care (Signed)
PMT note: ? ?Re-attempted to see patient for [place] discussion, but patient is currently with another discipline. Will try again tomorrow.  ?

## 2021-07-11 NOTE — Progress Notes (Signed)
Subjective: No new complaints   Antibiotics:  Anti-infectives (From admission, onward)    Start     Dose/Rate Route Frequency Ordered Stop   07/10/21 1800  vancomycin (VANCOREADY) IVPB 1250 mg/250 mL  Status:  Discontinued        1,250 mg 166.7 mL/hr over 90 Minutes Intravenous Every 24 hours 07/09/21 1747 07/10/21 1125   07/10/21 1230  Ampicillin-Sulbactam (UNASYN) 3 g in sodium chloride 0.9 % 100 mL IVPB        3 g 200 mL/hr over 30 Minutes Intravenous Every 6 hours 07/10/21 1125     07/09/21 1900  meropenem (MERREM) 1 g in sodium chloride 0.9 % 100 mL IVPB  Status:  Discontinued        1 g 200 mL/hr over 30 Minutes Intravenous Every 8 hours 07/09/21 1747 07/10/21 1125   07/09/21 1830  vancomycin (VANCOCIN) IVPB 1000 mg/200 mL premix        1,000 mg 200 mL/hr over 60 Minutes Intravenous  Once 07/09/21 1725 07/09/21 2214       Medications: Scheduled Meds:  Chlorhexidine Gluconate Cloth  6 each Topical Daily   multivitamin-lutein  1 capsule Oral Daily   nutrition supplement (JUVEN)  1 packet Oral BID BM   pantoprazole  40 mg Oral Q1200   Ensure Max Protein  11 oz Oral BID   sodium chloride flush  10-40 mL Intracatheter Q12H   [START ON 07/12/2021] vitamin B-12  1,000 mcg Oral Daily   Continuous Infusions:  ampicillin-sulbactam (UNASYN) IV 3 g (07/11/21 1724)   PRN Meds:.acetaminophen, diphenhydrAMINE, loperamide, morphine injection, ondansetron (ZOFRAN) IV, oxyCODONE-acetaminophen, sodium chloride flush, traZODone    Objective: Weight change:   Intake/Output Summary (Last 24 hours) at 07/11/2021 2016 Last data filed at 07/11/2021 1853 Gross per 24 hour  Intake 1020 ml  Output 1750 ml  Net -730 ml   Blood pressure 92/61, pulse 89, temperature 98.2 F (36.8 C), resp. rate 18, height 5\' 10"  (1.778 m), weight 47.9 kg, SpO2 98 %. Temp:  [98.1 F (36.7 C)-98.2 F (36.8 C)] 98.2 F (36.8 C) (03/15 1726) Pulse Rate:  [87-101] 89 (03/15 1726) Resp:  [18-20]  18 (03/15 1726) BP: (91-96)/(61-66) 92/61 (03/15 1726) SpO2:  [98 %-99 %] 98 % (03/15 1726)  Physical Exam: Physical Exam Vitals reviewed.  Constitutional:      Appearance: Normal appearance.  HENT:     Head: Normocephalic and atraumatic.  Cardiovascular:     Rate and Rhythm: Normal rate.  Pulmonary:     Effort: Pulmonary effort is normal. No respiratory distress.     Breath sounds: No wheezing.  Abdominal:     General: There is no distension.  Neurological:     Mental Status: He is alert and oriented to person, place, and time.  Psychiatric:        Attention and Perception: Attention normal.        Mood and Affect: Mood is depressed.        Speech: Speech normal.        Behavior: Behavior normal.        Cognition and Memory: Memory normal.        Judgment: Judgment normal.     CBC:    BMET Recent Labs    07/09/21 1856 07/10/21 0541  NA 138 138  K 3.2* 2.9*  CL 107 110  CO2 22 22  GLUCOSE 112* 97  BUN 7 6  CREATININE 0.51* 0.40*  CALCIUM 7.4* 7.5*     Liver Panel  No results for input(s): PROT, ALBUMIN, AST, ALT, ALKPHOS, BILITOT, BILIDIR, IBILI in the last 72 hours.     Sedimentation Rate Recent Labs    07/09/21 1856  ESRSEDRATE 107*   C-Reactive Protein Recent Labs    07/09/21 1856  CRP 18.4*    Micro Results: Recent Results (from the past 720 hour(s))  CULTURE, BLOOD (ROUTINE X 2) w Reflex to ID Panel     Status: Abnormal (Preliminary result)   Collection Time: 07/09/21  6:56 PM   Specimen: BLOOD  Result Value Ref Range Status   Specimen Description   Final    BLOOD LEFT ASSIST CONTROL Performed at [facility], [address], [place], [place] [address]    Special Requests   Final    BOTTLES DRAWN AEROBIC AND ANAEROBIC Blood Culture adequate volume Performed at [facility], [address] [place], [place] [address]    Culture  Setup Time   Final    GRAM POSITIVE COCCI AEROBIC BOTTLE ONLY Organism ID to  follow CRITICAL RESULT CALLED TO, READ BACK BY AND VERIFIED WITHDelight Stare [facility] 1914 07/10/21 HNM Performed at [facility], [address], [place], [place] [address]    Culture (A)  Final    ENTEROCOCCUS FAECALIS SUSCEPTIBILITIES TO FOLLOW Performed at [facility], [address] [place], [place] [address]    Report Status PENDING  Incomplete  CULTURE, BLOOD (ROUTINE X 2) w Reflex to ID Panel     Status: None (Preliminary result)   Collection Time: 07/09/21  6:56 PM   Specimen: BLOOD  Result Value Ref Range Status   Specimen Description BLOOD LEFT HAND  Final   Special Requests   Final    BOTTLES DRAWN AEROBIC AND ANAEROBIC Blood Culture adequate volume   Culture   Final    NO GROWTH 2 DAYS Performed at [facility], [address] [place], [place] [address]    Report Status PENDING  Incomplete  Blood Culture ID Panel (Reflexed)     Status: Abnormal   Collection Time: 07/09/21  6:56 PM  Result Value Ref Range Status   Enterococcus faecalis DETECTED (A) NOT DETECTED Final    Comment: CRITICAL RESULT CALLED TO, READ BACK BY AND VERIFIED WITH: Delight Stare [facility] 4696 07/10/21 HNM    Enterococcus Faecium NOT DETECTED NOT DETECTED Final   Listeria monocytogenes NOT DETECTED NOT DETECTED Final   Staphylococcus species NOT DETECTED NOT DETECTED Final   Staphylococcus aureus (BCID) NOT DETECTED NOT DETECTED Final   Staphylococcus epidermidis NOT DETECTED NOT DETECTED Final   Staphylococcus lugdunensis NOT DETECTED NOT DETECTED Final   Streptococcus species NOT DETECTED NOT DETECTED Final   Streptococcus agalactiae NOT DETECTED NOT DETECTED Final   Streptococcus pneumoniae NOT DETECTED NOT DETECTED Final   Streptococcus pyogenes NOT DETECTED NOT DETECTED Final   A.calcoaceticus-baumannii NOT DETECTED NOT DETECTED Final   Bacteroides fragilis NOT DETECTED NOT DETECTED Final   Enterobacterales NOT DETECTED NOT DETECTED Final   Enterobacter  cloacae complex NOT DETECTED NOT DETECTED Final   Escherichia coli NOT DETECTED NOT DETECTED Final   Klebsiella aerogenes NOT DETECTED NOT DETECTED Final   Klebsiella oxytoca NOT DETECTED NOT DETECTED Final   Klebsiella pneumoniae NOT DETECTED NOT DETECTED Final   Proteus species NOT DETECTED NOT DETECTED Final   Salmonella species NOT DETECTED NOT DETECTED Final   Serratia marcescens NOT DETECTED NOT DETECTED Final   Haemophilus influenzae  NOT DETECTED NOT DETECTED Final   Neisseria meningitidis NOT DETECTED NOT DETECTED Final   Pseudomonas aeruginosa NOT DETECTED NOT DETECTED Final   Stenotrophomonas maltophilia NOT DETECTED NOT DETECTED Final   Candida albicans NOT DETECTED NOT DETECTED Final   Candida auris NOT DETECTED NOT DETECTED Final   Candida glabrata NOT DETECTED NOT DETECTED Final   Candida krusei NOT DETECTED NOT DETECTED Final   Candida parapsilosis NOT DETECTED NOT DETECTED Final   Candida tropicalis NOT DETECTED NOT DETECTED Final   Cryptococcus neoformans/gattii NOT DETECTED NOT DETECTED Final   Vancomycin resistance NOT DETECTED NOT DETECTED Final    Comment: Performed at [facility], [address] [place], [place] [address]  Resp Panel by RT-PCR (Flu A&B, Covid) Nasopharyngeal Swab     Status: None   Collection Time: 07/09/21  7:48 PM   Specimen: Nasopharyngeal Swab; Nasopharyngeal(NP) swabs in vial transport medium  Result Value Ref Range Status   SARS Coronavirus 2 by RT PCR NEGATIVE NEGATIVE Final    Comment: (NOTE) SARS-CoV-2 target nucleic acids are NOT DETECTED.  The SARS-CoV-2 RNA is generally detectable in upper respiratory specimens during the acute phase of infection. The lowest concentration of SARS-CoV-2 viral copies this assay can detect is 138 copies/mL. A negative result does not preclude SARS-Cov-2 infection and should not be used as the sole basis for treatment or other patient management decisions. A negative result may occur with   improper specimen collection/handling, submission of specimen other than nasopharyngeal swab, presence of viral mutation(s) within the areas targeted by this assay, and inadequate number of viral copies(<138 copies/mL). A negative result must be combined with clinical observations, patient history, and epidemiological information. The expected result is Negative.  Fact Sheet for Patients:  BloggerCourse.com  Fact Sheet for Healthcare Providers:  SeriousBroker.it  This test is no t yet approved or cleared by the [place] FDA and  has been authorized for detection and/or diagnosis of SARS-CoV-2 by FDA under an Emergency Use Authorization (EUA). This EUA will remain  in effect (meaning this test can be used) for the duration of the COVID-19 declaration under Section 564(b)(1) of the Act, 21 U.S.C.section 360bbb-3(b)(1), unless the authorization is terminated  or revoked sooner.       Influenza A by PCR NEGATIVE NEGATIVE Final   Influenza B by PCR NEGATIVE NEGATIVE Final    Comment: (NOTE) The Xpert Xpress SARS-CoV-2/FLU/RSV plus assay is intended as an aid in the diagnosis of influenza from Nasopharyngeal swab specimens and should not be used as a sole basis for treatment. Nasal washings and aspirates are unacceptable for Xpert Xpress SARS-CoV-2/FLU/RSV testing.  Fact Sheet for Patients: BloggerCourse.com  Fact Sheet for Healthcare Providers: SeriousBroker.it  This test is not yet approved or cleared by the [place] FDA and has been authorized for detection and/or diagnosis of SARS-CoV-2 by FDA under an Emergency Use Authorization (EUA). This EUA will remain in effect (meaning this test can be used) for the duration of the COVID-19 declaration under Section 564(b)(1) of the Act, 21 U.S.C. section 360bbb-3(b)(1), unless the authorization is terminated  or revoked.  Performed at [facility], [address] [place], [place] [address]     Studies/Results: [facility] Chest Port 1 View  Result Date: 07/10/2021 CLINICAL DATA:  Cough.  Sacral decubitus ulcer. EXAM: PORTABLE CHEST 1 VIEW COMPARISON:  07/02/2021 FINDINGS: Right jugular central line with the tip in the SVC region. Again noted are surgical rods involving the thoracic and upper lumbar spine. Lungs  are essentially clear. Few prominent linear densities in the right lower chest could represent normal vascular markings. No significant airspace disease. Heart size is normal. Patient is slightly rotated on the examination. Negative for a pneumothorax. IMPRESSION: No active disease. Electronically Signed   By: Richarda Overlie M.D.   On: 07/10/2021 08:53   ECHOCARDIOGRAM COMPLETE  Result Date: 07/11/2021    ECHOCARDIOGRAM REPORT   Patient Name:   Jonathon York Date of Exam: 07/10/2021 Medical Rec #:  [id number]        Height:       70.0 in Accession #:    [id number]       Weight:       105.6 lb Date of Birth:  01-Jun-1975        BSA:          1.591 m Patient Age:    46 years         BP:           92/60 mmHg Patient Gender: M                HR:           85 bpm. Exam Location:  [facility] Procedure: 2D Echo, Cardiac Doppler and Color Doppler Indications:     Bacteremia R78.81  History:         Patient has no prior history of Echocardiogram examinations.                  CHF, COPD; Risk Factors:Diabetes and Hypertension.  Sonographer:     Cristela Blue Referring Phys:  [id number] Reining DAM Diagnosing Phys: Arnoldo Hooker MD  Sonographer Comments: No apical window and suboptimal subcostal window. Image acquisition challenging due to patient body habitus. IMPRESSIONS  1. Left ventricular ejection fraction, by estimation, is 55 to 60%. The left ventricle has normal function. The left ventricle has no regional wall motion abnormalities. Left ventricular diastolic parameters were normal.  2. Right ventricular  systolic function is normal. The right ventricular size is normal.  3. The mitral valve is normal in structure. Mild mitral valve regurgitation.  4. The aortic valve is normal in structure. Aortic valve regurgitation is not visualized. FINDINGS  Left Ventricle: Left ventricular ejection fraction, by estimation, is 55 to 60%. The left ventricle has normal function. The left ventricle has no regional wall motion abnormalities. The left ventricular internal cavity size was normal in size. There is  no left ventricular hypertrophy. Left ventricular diastolic parameters were normal. Right Ventricle: The right ventricular size is normal. No increase in right ventricular wall thickness. Right ventricular systolic function is normal. Left Atrium: Left atrial size was normal in size. Right Atrium: Right atrial size was normal in size. Pericardium: There is no evidence of pericardial effusion. Mitral Valve: The mitral valve is normal in structure. Mild mitral valve regurgitation. Tricuspid Valve: The tricuspid valve is normal in structure. Tricuspid valve regurgitation is mild. Aortic Valve: The aortic valve is normal in structure. Aortic valve regurgitation is not visualized. Pulmonic Valve: The pulmonic valve was normal in structure. Pulmonic valve regurgitation is trivial. Aorta: The aortic root and ascending aorta are structurally normal, with no evidence of dilitation. IAS/Shunts: No atrial level shunt detected by color flow Doppler.  LEFT VENTRICLE PLAX 2D LVIDd:         4.50 cm LVIDs:         3.20 cm LV PW:         1.00 cm  LV IVS:        0.80 cm  LEFT ATRIUM         Index LA diam:    2.40 cm 1.51 cm/m                        PULMONIC VALVE AORTA                 PV Vmax:        0.78 m/s Ao Root diam: 3.53 cm PV Vmean:       50.100 cm/s                       PV VTI:         0.129 m                       PV Peak grad:   2.4 mmHg                       PV Mean grad:   1.0 mmHg                       RVOT Peak grad: 4 mmHg    SHUNTS Pulmonic VTI: 0.143 m Arnoldo Hooker MD Electronically signed by Arnoldo Hooker MD Signature Date/Time: 07/11/2021/4:11:46 PM    Final       Assessment/Plan:  INTERVAL HISTORY: Transthoracic echocardiogram is negative he has been formally seen by orthopedic surgery   Principal Problem:   Sacral decubitus ulcer, stage IV (HCC) Active Problems:   Paraplegia (HCC)   Neurogenic bladder   Protein-calorie malnutrition, severe   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HCC)   Microcytic anemia   GERD (gastroesophageal reflux disease)   Diarrhea    Jonathon York is a 46 y.o. 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  He underwent debridement of soft tissue which yielded a multidrug-resistant Pseudomonas for which she was placed on meropenem he was transferred to [facility] long-term care facility where he appears to have remained on meropenem ever since and was sent home with meropenem as well.  He was not taking the IV antibiotics at home and and claims that he was not taught how to use them.  He was sent home with a dual-lumen central line in his chest.  He had increasing drainage from his wounds and fevers and chills and came to the hospital.  Blood cultures taken in the admission are positive for Enterococcus faecalis the 1 of 2 sites.  He is narrowed to Unasyn.  Susceptibilities of Enterococcus are pending but expected to show ampicillin susceptibility.  2D echocardiogram is negative.  #1 Enterococcus faecalis bacteremia: Multiple potential sources including his pelvic osteomyelitis.  Ideally his central line should be  removed in the context of bacteremia.  He claims however that this is one of the last options for IV access for him.  His 2D echocardiogram is negative for endocarditis.   I have ordered repeat blood cultures  I do not feel particularly strongly  about him getting it transesophageal echocardiogram at this point.  I am willing to treat "through his catheter" though there  does need to be a long-term plan for him.  #2 extensive osteomyelitis:  He has poor insight into the fact that he will need very aggressive and morbid surgical intervention to affect cure.  I would think that he would need likely bilateral Girdlestone surgery.  If surgery is performed cultures could be helpful.  Again he is very much against any type of surgery that would involve loss of his limbs.  I have been very frank with him and telling him that he cannot be on IV antibiotics indefinitely and that such prescription of antibiotics will only result in more multidrug-resistant organisms.  There is no antibiotic solution for his osteomyelitis absent the aggressive surgeries mentioned above.  I think that he would benefit most from being transferred to a tertiary care center.  They could certainly offer him the surgical interventions he would need for cure which will need to be involving orthopedic surgery likely plastic surgery and certainly infectious disease.  Ultimately absent such as surgery he has an incurable infection that ultimately puts his life at risk.  I agree with palliative care consult but I am skeptical of his ability to have insight into his condition at this point in time.  #3 Central line in place:  He is certainly not a patient who should be appropriate for discharge to home with a central line in my opinion.  I do not think he is capable of taking care of it himself.  He very much wants to keep it but if that is going to be done there needs to be a clear long-term plan for him which I do  not see currently absent aggressive surgery.  I spent 35 minutes with the patient including than 50% of the time in face to face counseling of the patient regarding his extensive pelvic osteomyelitis his enterococcal bacteremia personally  along with review of medical records in preparation for the visit and during the visit and in coordination of his  care.  I will not be at [facility] in person tomorrow but will come on Friday and evaluate him then.    LOS: 2 days   Acey Lav 07/11/2021, 8:16 PM

## 2021-07-11 NOTE — Consult Note (Signed)
?ORTHOPAEDIC CONSULTATION ? ?REQUESTING PHYSICIAN: Jennye Boroughs, MD ? ?Chief Complaint: Decubitus ulcers, osteomyelitis ? ?HPI: ?Jonathon York is a 46 y.o. male admitted from infectious disease outpatient provider given concerns for chronic osteomyelitis and chronic bilateral hip decubitus and sacral ulcers. ? ?Patient was previously in [place] and discharged home with home health service doing his own dressing changes. Patient was consulted on by general surgery who recommended orthopedic consultation given chronic osteomyelitis findings. ?Infectious disease has seen the patient and has recommended IV Unasyn as well as discussion of referral to tertiary care center for surgical and multidisciplinary management. ? ?Past Medical History:  ?Diagnosis Date  ? AKI (acute kidney injury) ([place])   ? Anxiety   ? Decubitus ulcer   ? Depression   ? Neurogenic bladder   ? Osteomyelitis ([place])   ? Paraparesis of both lower limbs ([place]) 02/21/00  ? ?Past Surgical History:  ?Procedure Laterality Date  ? AMPUTATION Right 03/19/2018  ? Procedure: 5th Metatarsal Resection;  Surgeon: Albertine Patricia, DPM;  Location: [facility] ORS;  Service: Podiatry;  Laterality: Right;  ? AMPUTATION TOE Right 04/29/2015  ? Procedure: AMPUTATION TOE;  Surgeon: Sharlotte Alamo, MD;  Location: [facility] ORS;  Service: Podiatry;  Laterality: Right;  ? INCISION AND DRAINAGE ABSCESS N/A 03/24/2021  ? Procedure: INCISION AND DRAINAGE STAGE IV DECUBITUS ULCER;  Surgeon: Herbert Pun, MD;  Location: [facility] ORS;  Service: General;  Laterality: N/A;  ? IRRIGATION AND DEBRIDEMENT BUTTOCKS    ? TOE AMPUTATION Right   ? ?Social History  ? ?Socioeconomic History  ? Marital status: Single  ?  Spouse name: Not on file  ? Number of children: Not on file  ? Years of education: Not on file  ? Highest education level: Not on file  ?Occupational History  ? Occupation: disabled  ?Tobacco Use  ? Smoking status: Former  ?  Packs/day: 0.50  ?  Types: Cigarettes  ?  Quit date: 2018   ?  Years since quitting: 5.2  ? Smokeless tobacco: Never  ?Vaping Use  ? Vaping Use: Never used  ?Substance and Sexual Activity  ? Alcohol use: No  ? 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 Sexual activity: Never  ?Other Topics Concern  ? Not on file  ?Social History Narrative  ? Not on file  ? ?Social Determinants of Health  ? ?Financial Resource Strain: Not on file  ?Food Insecurity: Not on file  ?Transportation Needs: Not on file  ?Physical Activity: Not on file  ?Stress: Not on file  ?Social Connections: Not on file  ? ?Family History  ?Problem Relation Age of Onset  ? Lymphoma Sister   ? ?Allergies  ?Allergen Reactions  ? Orange Fruit [Citrus] Other (See Comments)  ?  Blisters only from [place]!!! Patient is not allergic from all citrus  ? Sulfa Antibiotics   ?  Throwing up blood   ? ?Prior to Admission medications   ?Medication Sig Start Date End Date Taking? Authorizing Provider  ?Chlorhexidine Gluconate Cloth 2 % PADS Apply 6 each topically daily. ?Patient not taking: Reported on 07/09/2021 04/03/21   Terrilee Croak, MD  ?collagenase (SANTYL) ointment Apply topically 2 (two) times daily. ?Patient not taking: Reported on 07/09/2021 04/02/21   Terrilee Croak, MD  ?feeding supplement (ENSURE ENLIVE / ENSURE PLUS) LIQD Take 237 mLs by mouth 3 (three) times daily between meals. 04/02/21   Terrilee Croak, MD  ?HYDROcodone-acetaminophen (NORCO/VICODIN) 5-325 MG tablet Take  1-2 tablets by mouth every 6 (six) hours as needed (breakthrough pain). ?Patient not taking: Reported on 07/09/2021 04/02/21   Terrilee Croak, MD  ?HYDROmorphone (DILAUDID) 2 MG tablet Take 0.5 tablets (1 mg total) by mouth every 6 (six) hours as needed for moderate pain. ?Patient not taking: Reported on 07/09/2021 04/02/21   Terrilee Croak, MD  ?magic mouthwash w/lidocaine SOLN Take 2 mLs by mouth 3 (three) times daily as needed for mouth pain. ?Patient not taking: Reported on 07/09/2021 04/02/21   Terrilee Croak, MD  ?meropenem  1 g in sodium chloride 0.9 % 100 mL Inject 1 g into the vein every 8 (eight) hours. 04/02/21   Terrilee Croak, MD  ?multivitamin-lutein (OCUVITE-LUTEIN) CAPS capsule Take 1 capsule by mouth daily. ?Patient not taking: Reported on 07/09/2021 04/03/21   Terrilee Croak, MD  ?polyethylene glycol (MIRALAX / GLYCOLAX) 17 g packet Take 17 g by mouth daily as needed for moderate constipation. ?Patient not taking: Reported on 07/09/2021 04/02/21   Terrilee Croak, MD  ?traZODone (DESYREL) 50 MG tablet Take 1 tablet (50 mg total) by mouth at bedtime as needed for sleep. ?Patient not taking: Reported on 07/09/2021 04/02/21   Terrilee Croak, MD  ?valACYclovir (VALTREX) 500 MG tablet Take 1 tablet (500 mg total) by mouth 2 (two) times daily. ?Patient not taking: Reported on 07/09/2021 04/02/21   Terrilee Croak, MD  ? ?[facility] ? ?Result Date: 07/10/2021 ?CLINICAL DATA:  Cough.  Sacral decubitus ulcer. EXAM: PORTABLE CHEST 1 VIEW COMPARISON:  07/02/2021 FINDINGS: Right jugular central line with the tip in the SVC region. Again noted are surgical rods involving the thoracic and upper lumbar spine. Lungs are essentially clear. Few prominent linear densities in the right lower chest could represent normal vascular markings. No significant airspace disease. Heart size is normal. Patient is slightly rotated on the examination. Negative for a pneumothorax. IMPRESSION: No active disease. Electronically Signed   By: Markus Daft M.D.   On: 07/10/2021 08:53   ? ?Positive ROS: All other systems have been reviewed and were otherwise negative with the exception of those mentioned in the HPI and as above. ? ?Physical Exam: ?General: Alert, no acute distress ?Cardiovascular: No pedal edema ?Respiratory: No cyanosis, no use of accessory musculature ?GI: No organomegaly, abdomen is soft and non-tender ?Psychiatric: Patient is competent for consent with normal mood and affect ?Lymphatic: No axillary or cervical lymphadenopathy ? ?Large decubitus ulcers  of bilateral hips as well as a large sacral ulcer, all with granulation tissue without significant erythema ?Pictures of decubitus ulcers best seen in Dr. Annamary Rummage consult note 3/13 ? ?Assessment: ?46yo M with paraplegia, chronic severe sacral ulcer, chronic bilateral hip decubitus ulcers with chronic osteomyelitis, right hip dislocation ? ?Plan: ?I had a discussion with the patient as well as his case manager regarding my opinion with respect to his decubitus ulcers as well as findings of chronic osteomyelitis.  The patient would likely require a large bony resection procedure (ie girdlestone) followed by soft tissue coverage to eradicate the chronic infection.  I have discussed this case with other orthopedic surgeons at [facility] and this type of procedure is beyond the scope of our practice.  Additionally, the multidisciplinary approach with providers having experience taking care of this complex pathology (general surgery, orthopedics, plastic surgery, infectious disease, palliative care) is not available at [facility]. ?I agree with Dr. Priscella Mann with respect to his attempts to transfer the patient to a tertiary care facility. ? ? ?Renee Harder, MD ? ? ?  07/11/2021 ?12:47 PM ? ?  ?

## 2021-07-11 NOTE — Plan of Care (Addendum)
PMT note: ? ?Consult noted for GOC. Patient currently has a visitor with an ID badge from an outside company discussing his care and plans. Will reattempt at another time.   ?

## 2021-07-11 NOTE — Progress Notes (Signed)
? ? ? ?Progress Note  ? ? ?[name]  [id number] DOB: 01-09-1976  DOA: 07/09/2021 ?PCP: Leonel Ramsay, MD  ? ? ? ? ?Brief Narrative:  ? ? ?Medical records reviewed and are as summarized below: ? ?Jonathon York is a 46 y.o. male with medical history significant for paraplegia, neurogenic bladder, s/p suprapubic catheter, chronic severe sacral decubitus ulcers, chronic anemia, malnutrition, anxiety, UTI, chronic right hip dislocation.  He was discharged from [facility] on 04/02/2021 after hospitalization for septic shock, strep anginosus bacteremia, stage IV sacral decubitus ulcers and osteomyelitis of bilateral hips with involvement of the sacrum and coccyx.  He underwent debridement of the sacral wound at that time.  Pseudomonas aeruginosa was isolated from the wound.  He was subsequently discharged on IV meropenem, to [facility] where he stayed for  about 3 months.  He said he had a right IJ central line placed on 07/01/2021 and he was discharged home on 07/03/2021 from the [facility] hospital.  However, he said that he had not been taking the IV antibiotics because he did not have any output or and also he did not have any electricity or water at home.   ? ?He had increasing pain in the hip and sacral wounds and noticed increasing drainage from the sacral wounds as well.  He was referred to the hospital for direct admission by Dr. Ola Spurr, his PCP and ID physician, for evaluation of  worsening sacral decubitus ulcer and bilateral hip osteomyelitis.  He has been following up with ID for his sacral ulcer but this has gotten worse. ? ? ?He was found to have Enterococcus bacteremia on this admission.  Dr. Windell Moment, general surgeon, was consulted but from his standpoint, there was no indication for debridement.  Dr. Sharlet Salina, orthopedic surgeon, was also consulted to assist with management.  He recommended referral to a tertiary center for further management because of the complexity of the  case. ? ? ? ? ? ?Assessment/Plan:  ? ?Principal Problem: ?  Sacral decubitus ulcer, stage IV ([place]) ?Active Problems: ?  Paraplegia ([place]) ?  Neurogenic bladder ?  Protein-calorie malnutrition, severe ?  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[place]) ?  Microcytic anemia ?  GERD (gastroesophageal reflux disease) ?  Diarrhea ? ? ?Nutrition Problem: Moderate Malnutrition ?Etiology: chronic illness (non healing sacral wound, paraplegia) ? ?Signs/Symptoms: moderate fat depletion, moderate muscle depletion, severe muscle depletion ? ? ?Body mass index is 15.15 kg/m?.  ? ? ?Enterococcus faecalis bacteremia, ?Left hip osteomyelitis, right hip osteomyelitis with chronic right hip dislocation, chronic bony destruction of the lower sacrum and coccyx, stage IV sacral decubitus ulcers ?Continue IV Unasyn.  No vegetations on 2D echo. ?He was evaluated by Dr. Sharlet Salina, orthopedic surgeon, who recommended referral to a tertiary care center. ? ?I spoke to Dr. Aretha Parrot, orthopedic surgeon, at [facility], on 07/11/2021 at 4:40 pm. He will be happy to see the patient in consultation when he gets to [facility].  However, he requested that I contact the hospitalist team so the patient will be at the admitted under the hospitalist service. I was told by the transfer coordinator on the [facility] line that there are no beds and wanted me to call back again to see if there are beds available. ? ? ?Acute on chronic anemia, vitamin B12 deficiency ?S/p transfusion with 1 unit of PRBCs.  Monitor H&H and transfuse as needed. ?Treated with vitamin B12 injection ? ? ?Hypokalemia ?Repeat BMP tomorrow ? ? ?  Paraplegia, neurogenic bladder ?Suprapubic catheter in place ? ? ?Severe protein caloric malnutrition ?Continue nutritional supplements.  Follow-up with dietitian ? ? ?Diet Order   ? ?       ?  Diet regular Room service appropriate? Yes; Fluid consistency: Thin  Diet effective now       ?  ? ?  ?  ? ?   ? ? ? ?Consultants: ?General surgeon ?Orthopedic surgeon ?Infectious disease ? ?Procedures: ?None ? ? ? ?Medications:  ? ? Chlorhexidine Gluconate Cloth  6 each Topical Daily  ? multivitamin-lutein  1 capsule Oral Daily  ? nutrition supplement (JUVEN)  1 packet Oral BID BM  ? pantoprazole  40 mg Oral Q1200  ? Ensure Max Protein  11 oz Oral BID  ? sodium chloride flush  10-40 mL Intracatheter Q12H  ? ?Continuous Infusions: ? ampicillin-sulbactam (UNASYN) IV 3 g (07/11/21 0532)  ? ? ? ?Anti-infectives (From admission, onward)  ? ? Start     Dose/Rate Route Frequency Ordered Stop  ? 07/10/21 1800  vancomycin (VANCOREADY) IVPB 1250 mg/250 mL  Status:  Discontinued       ? 1,250 mg ?166.7 mL/hr over 90 Minutes Intravenous Every 24 hours 07/09/21 1747 07/10/21 1125  ? 07/10/21 1230  Ampicillin-Sulbactam (UNASYN) 3 g in sodium chloride 0.9 % 100 mL IVPB       ? 3 g ?200 mL/hr over 30 Minutes Intravenous Every 6 hours 07/10/21 1125    ? 07/09/21 1900  meropenem (MERREM) 1 g in sodium chloride 0.9 % 100 mL IVPB  Status:  Discontinued       ? 1 g ?200 mL/hr over 30 Minutes Intravenous Every 8 hours 07/09/21 1747 07/10/21 1125  ? 07/09/21 1830  vancomycin (VANCOCIN) IVPB 1000 mg/200 mL premix       ? 1,000 mg ?200 mL/hr over 60 Minutes Intravenous  Once 07/09/21 1725 07/09/21 2214  ? ?  ? ? ? ? ? ? ? ? ? ?Family Communication/Anticipated D/C date and plan/Code Status  ? ?DVT prophylaxis: Place and maintain sequential compression device Start: 07/10/21 0828 ? ? ?  Code Status: Full Code ? ?Family Communication: None ?Disposition Plan: Plan to transfer to tertiary center for further management ? ? ?Status is: Inpatient ?Remains inpatient appropriate because: IV antibiotics ? ? ? ? ? ? ?Subjective:  ? ?Interval events noted.  He complains of pain from sacral ulcers and pain in the hips.  He is frustrated about his condition.  He said he just wants a skin flap for his wounds. ? ?Objective:  ? ? ?Vitals:  ? 07/10/21 1953 07/11/21  [id number] 07/11/21 0804 07/11/21 0831  ?BP: 95/60 91/64 96/66    ?Pulse: 89 87 (!) 101 93  ?Resp: 18 18 20    ?Temp: 98.5 ?F (36.9 ?C) 98.2 ?F (36.8 ?C) 98.1 ?F (36.7 ?C)   ?TempSrc:      ?SpO2:  99% 99%   ?Weight:      ?Height:      ? ?No data found. ? ? ?Intake/Output Summary (Last 24 hours) at 07/11/2021 1051 ?Last data filed at 07/11/2021 1036 ?Gross per 24 hour  ?Intake 1060 ml  ?Output 1750 ml  ?Net -690 ml  ? ?Filed Weights  ? 07/09/21 1646  ?Weight: 47.9 kg  ? ? ?Exam: ? ?GEN: NAD ?SKIN: Warm and dry stage IV sacral decubitus ulcers ?EYES: No pallor or icterus ?ENT: MMM ?CV: RRR ?PULM: CTA B ?ABD: soft, ND, NT, +BS ?CNS: AAO x 3, paraplegic ?EXT:  No edema or tenderness ?GU: Suprapubic catheter in place ? ? ? ? ? ?Data Reviewed:  ? ?I have personally reviewed following labs and imaging studies: ? ?Labs: ?Labs show the following:  ? ?Basic Metabolic Panel: ?Recent Labs  ?Lab 07/09/21 ?1856 07/10/21 ?0541  ?NA 138 138  ?K 3.2* 2.9*  ?CL 107 110  ?CO2 22 22  ?GLUCOSE 112* 97  ?BUN 7 6  ?CREATININE 0.51* 0.40*  ?CALCIUM 7.4* 7.5*  ? ?GFR ?Estimated Creatinine Clearance: 78.2 mL/min (A) (by C-G formula based on SCr of 0.4 mg/dL (L)). ?Liver Function Tests: ?No results for input(s): AST, ALT, ALKPHOS, BILITOT, PROT, ALBUMIN in the last 168 hours. ?No results for input(s): LIPASE, AMYLASE in the last 168 hours. ?No results for input(s): AMMONIA in the last 168 hours. ?Coagulation profile ?Recent Labs  ?Lab 07/09/21 ?1856  ?INR 1.2  ? ? ?CBC: ?Recent Labs  ?Lab 07/09/21 ?1856 07/10/21 ?[id number] 07/10/21 ?1236  ?WBC 6.8 3.6*  --   ?HGB 7.1* 5.9* 7.5*  ?HCT 24.1* 20.6* 24.9*  ?MCV 73.3* 74.6*  --   ?PLT 298 235  --   ? ?Cardiac Enzymes: ?No results for input(s): CKTOTAL, CKMB, CKMBINDEX, TROPONINI in the last 168 hours. ?BNP (last 3 results) ?No results for input(s): PROBNP in the last 8760 hours. ?CBG: ?No results for input(s): GLUCAP in the last 168 hours. ?D-Dimer: ?No results for input(s): DDIMER in the last 72 hours. ?Hgb  A1c: ?No results for input(s): HGBA1C in the last 72 hours. ?Lipid Profile: ?No results for input(s): CHOL, HDL, LDLCALC, TRIG, CHOLHDL, LDLDIRECT in the last 72 hours. ?Thyroid function studies: ?No results for input(s): TSH, T4TOTAL, T

## 2021-07-12 DIAGNOSIS — L89154 Pressure ulcer of sacral region, stage 4: Secondary | ICD-10-CM | POA: Diagnosis not present

## 2021-07-12 DIAGNOSIS — Z7189 Other specified counseling: Secondary | ICD-10-CM | POA: Diagnosis not present

## 2021-07-12 DIAGNOSIS — E43 Unspecified severe protein-calorie malnutrition: Secondary | ICD-10-CM | POA: Diagnosis not present

## 2021-07-12 DIAGNOSIS — A498 Other bacterial infections of unspecified site: Secondary | ICD-10-CM | POA: Diagnosis present

## 2021-07-12 DIAGNOSIS — B952 Enterococcus as the cause of diseases classified elsewhere: Secondary | ICD-10-CM | POA: Diagnosis not present

## 2021-07-12 LAB — CBC WITH DIFFERENTIAL/PLATELET
Abs Immature Granulocytes: 0.01 10*3/uL (ref 0.00–0.07)
Basophils Absolute: 0 10*3/uL (ref 0.0–0.1)
Basophils Relative: 1 %
Eosinophils Absolute: 0.1 10*3/uL (ref 0.0–0.5)
Eosinophils Relative: 3 %
HCT: 24.3 % — ABNORMAL LOW (ref 39.0–52.0)
Hemoglobin: 7.2 g/dL — ABNORMAL LOW (ref 13.0–17.0)
Immature Granulocytes: 0 %
Lymphocytes Relative: 39 %
Lymphs Abs: 1.3 10*3/uL (ref 0.7–4.0)
MCH: 22.4 pg — ABNORMAL LOW (ref 26.0–34.0)
MCHC: 29.6 g/dL — ABNORMAL LOW (ref 30.0–36.0)
MCV: 75.7 fL — ABNORMAL LOW (ref 80.0–100.0)
Monocytes Absolute: 0.2 10*3/uL (ref 0.1–1.0)
Monocytes Relative: 7 %
Neutro Abs: 1.6 10*3/uL — ABNORMAL LOW (ref 1.7–7.7)
Neutrophils Relative %: 50 %
Platelets: 288 10*3/uL (ref 150–400)
RBC: 3.21 MIL/uL — ABNORMAL LOW (ref 4.22–5.81)
RDW: 17 % — ABNORMAL HIGH (ref 11.5–15.5)
WBC: 3.3 10*3/uL — ABNORMAL LOW (ref 4.0–10.5)
nRBC: 0 % (ref 0.0–0.2)

## 2021-07-12 LAB — BASIC METABOLIC PANEL
Anion gap: 7 (ref 5–15)
BUN: <5 mg/dL — ABNORMAL LOW (ref 6–20)
CO2: 26 mmol/L (ref 22–32)
Calcium: 7.6 mg/dL — ABNORMAL LOW (ref 8.9–10.3)
Chloride: 107 mmol/L (ref 98–111)
Creatinine, Ser: 0.44 mg/dL — ABNORMAL LOW (ref 0.61–1.24)
GFR, Estimated: >60 mL/min (ref >60)
Glucose, Bld: 89 mg/dL (ref 70–99)
Potassium: 3.1 mmol/L — ABNORMAL LOW (ref 3.5–5.1)
Sodium: 140 mmol/L (ref 135–145)

## 2021-07-12 LAB — MAGNESIUM: Magnesium: 2.1 mg/dL (ref 1.7–2.4)

## 2021-07-12 LAB — CULTURE, BLOOD (ROUTINE X 2): Special Requests: ADEQUATE

## 2021-07-12 LAB — COPPER, SERUM: Copper: 83 ug/dL (ref 69–132)

## 2021-07-12 LAB — ZINC: Zinc: 41 ug/dL — ABNORMAL LOW (ref 44–115)

## 2021-07-12 MED ORDER — ZINC SULFATE 220 (50 ZN) MG PO CAPS
220.0000 mg | ORAL_CAPSULE | Freq: Every day | ORAL | Status: AC
Start: 2021-07-13 — End: 2021-07-27
  Administered 2021-07-13 – 2021-07-18 (×6): 220 mg via ORAL
  Filled 2021-07-12 (×9): qty 1

## 2021-07-12 MED ORDER — VITAMIN D (ERGOCALCIFEROL) 1.25 MG (50000 UNIT) PO CAPS
50000.0000 [IU] | ORAL_CAPSULE | ORAL | Status: DC
  Administered 2021-07-12: 50000 [IU] via ORAL
  Filled 2021-07-12 (×3): qty 1

## 2021-07-12 MED ORDER — ASCORBIC ACID 500 MG PO TABS
500.0000 mg | ORAL_TABLET | Freq: Two times a day (BID) | ORAL | Status: DC
  Administered 2021-07-12 – 2021-07-28 (×25): 500 mg via ORAL
  Filled 2021-07-12 (×28): qty 1

## 2021-07-12 MED ORDER — VITAMIN B-12 1000 MCG PO TABS
1000.0000 ug | ORAL_TABLET | Freq: Every day | ORAL | Status: DC
  Administered 2021-07-15 – 2021-07-18 (×4): 1000 ug via ORAL
  Filled 2021-07-12 (×7): qty 1

## 2021-07-12 MED ORDER — SODIUM CHLORIDE 0.9 % IV SOLN
300.0000 mg | Freq: Every day | INTRAVENOUS | Status: AC
  Administered 2021-07-12 – 2021-07-14 (×3): 300 mg via INTRAVENOUS
  Filled 2021-07-12 (×3): qty 300

## 2021-07-12 MED ORDER — POTASSIUM CHLORIDE 10 MEQ/100ML IV SOLN
10.0000 meq | INTRAVENOUS | Status: AC
  Administered 2021-07-12 (×3): 10 meq via INTRAVENOUS
  Filled 2021-07-12 (×3): qty 100

## 2021-07-12 MED ORDER — CYANOCOBALAMIN 1000 MCG/ML IJ SOLN
1000.0000 ug | Freq: Every day | INTRAMUSCULAR | Status: AC
  Administered 2021-07-12 – 2021-07-14 (×3): 1000 ug via INTRAMUSCULAR
  Filled 2021-07-12 (×3): qty 1

## 2021-07-12 NOTE — Consult Note (Addendum)
? ?                                                                                ?Consultation Note ?Date: 07/12/2021  ? ?Patient Name: Jonathon York  ?DOB: 1975-09-08  MRN: [id number]  Age / Sex: 46 y.o., male  ?PCP: Leonel Ramsay, MD ?Referring Physician: Jennye Boroughs, MD ? ?Reason for Consultation: Establishing goals of care ? ?HPI/Patient Profile: Jonathon York is a 46 y.o. male with medical history significant for paraplegia, neurogenic bladder, s/p suprapubic catheter, chronic severe sacral decubitus ulcers, chronic anemia, malnutrition, anxiety, UTI, chronic right hip dislocation.   ? ?Clinical Assessment and Goals of Care: ?Patient is sitting up in bed. He states he is single and lives alone. He states he has a daughter, but she is very busy as she has 3 children she is raising by herself.  ? ?He states the way the wounds came about was that he was helping build a house and was sitting on a floor constantly. He states in addition he was smoking and had very poor nutrition.  He states he has stopped smoking and has improved his nutritional status. ? ?He discusses frustration with everyone being fixated on his wounds and infection in his wounds and not the care beyond that. ? ?We discussed his diagnoses, prognosis, GOC, EOL wishes disposition and options. ? ?Created space and opportunity for patient  to explore thoughts and feelings regarding current medical information.  ? ?A detailed discussion was had today regarding advanced directives.  Concepts specific to code status, artifical feeding and hydration, IV antibiotics and rehospitalization were discussed.  The difference between an aggressive medical intervention path and a comfort care path was discussed.  Values and goals of care important to patient and family were attempted to be elicited. ? ?Discussed limitations of medical interventions to prolong quality of life in some situations and discussed the concept of human mortality. ? ?He  states if the situation were life or limb, he would rather die with his body intact than have to have amputations; he is amenable to surgeries and procedures. He tells me that though he has paralysis, having his body intact is part of his quality of life.  Questions answered regarding comfort care should that decision ever be made.  He tells me that being awake and alert and able to use his upper extremities are also part of acceptable quality of life.   He tells me he would want short-term ventilator support but would not want a tracheostomy. He states that he would not want to live in a vegetative state. He states he would be amenable to a PEG tube, but not an NG tube.  He states he will give thought to [place]. ? ?He tells me he understands this is going to be a very long and hard road for him, and he is not ready to die.  He states he would prefer to live in a facility as he has no help at home. ? ?I left an advanced directive packet at bedside and I completed a MOST form today and the signed original was placed in the chart. A photocopy was placed in  the chart to be scanned into EMR. The patient outlined their wishes for the following treatment decisions: ? ?Cardiopulmonary Resuscitation: Attempt Resuscitation (CPR)  ?Medical Interventions: Full Scope of Treatment: Use intubation, advanced airway interventions, mechanical ventilation, cardioversion as indicated, medical treatment, IV fluids, etc, also provide comfort measures. Transfer to the hospital if indicated No tracheostomy, no amputations.  ?Antibiotics: Antibiotics if indicated  ?IV Fluids: IV fluids if indicated  ?Feeding Tube: Feeding tube long-term if indicated  ?  ? ? ?SUMMARY OF RECOMMENDATIONS   ?Full code full scope with the exception of: ? ?No tracheostomy ?No amputations ? ?He would like to be transferred to [place], or [place]. ? ? ? ? ?  ? ?Primary Diagnoses: ?Present on Admission: ? Sacral  decubitus ulcer, stage IV ([place]) ? Protein-calorie malnutrition, severe ? Neurogenic bladder ? Microcytic anemia ? GERD (gastroesophageal reflux disease) ? Diarrhea ? Paraplegia ([place]) ? ? ?I have reviewed the medical record, interviewed the patient and family, and examined the patient. The following aspects are pertinent. ? ?Past Medical History:  ?Diagnosis Date  ? AKI (acute kidney injury) ([place])   ? Anxiety   ? Decubitus ulcer   ? Depression   ? Neurogenic bladder   ? Osteomyelitis ([place])   ? Paraparesis of both lower limbs ([place]) 02/21/00  ? ?Social History  ? ?Socioeconomic History  ? Marital status: Single  ?  Spouse name: Not on file  ? Number of children: Not on file  ? Years of education: Not on file  ? Highest education level: Not on file  ?Occupational History  ? Occupation: disabled  ?Tobacco Use  ? Smoking status: Former  ?  Packs/day: 0.50  ?  Types: Cigarettes  ?  Quit date: 2018  ?  Years since quitting: 5.2  ? Smokeless tobacco: Never  ?Vaping Use  ? Vaping Use: Never used  ?Substance and Sexual Activity  ? Alcohol use: No  ? 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 Sexual activity: Never  ?Other Topics Concern  ? Not on file  ?Social History Narrative  ? Not on file  ? ?Social Determinants of Health  ? ?Financial Resource Strain: Not on file  ?Food Insecurity: Not on file  ?Transportation Needs: Not on file  ?Physical Activity: Not on file  ?Stress: Not on file  ?Social Connections: Not on file  ? ?Family History  ?Problem Relation Age of Onset  ? Lymphoma Sister   ? ?Scheduled Meds: ? Chlorhexidine Gluconate Cloth  6 each Topical Daily  ? cyanocobalamin  1,000 mcg Intramuscular Daily  ? multivitamin-lutein  1 capsule Oral Daily  ? nutrition supplement (JUVEN)  1 packet Oral BID BM  ? pantoprazole  40 mg Oral Q1200  ? Ensure Max Protein  11 oz Oral BID  ? sodium chloride flush  10-40 mL Intracatheter Q12H  ? [START ON 07/15/2021] vitamin B-12  1,000 mcg Oral Daily  ?  Vitamin D (Ergocalciferol)  50,000 Units Oral Q7 days  ? ?Continuous Infusions: ? ampicillin-sulbactam (UNASYN) IV 3 g (07/12/21 [id number])  ? ?PRN Meds:.acetaminophen, diphenhydrAMINE, loperamide, morphine injection, ondansetron (ZOFRAN) IV, oxyCODONE-acetaminophen, sodium chloride flush, traZODone ?Medications Prior to Admission:  ?Prior to Admission medications   ?Medication Sig Start Date End Date Taking? Authorizing Provider  ?Chlorhexidine Gluconate Cloth 2 % PADS Apply 6 each topically daily. ?Patient not taking: Reported on 07/09/2021 04/03/21   Terrilee Croak, MD  ?  collagenase (SANTYL) ointment Apply topically 2 (two) times daily. ?Patient not taking: Reported on 07/09/2021 04/02/21   Terrilee Croak, MD  ?feeding supplement (ENSURE ENLIVE / ENSURE PLUS) LIQD Take 237 mLs by mouth 3 (three) times daily between meals. 04/02/21   Terrilee Croak, MD  ?HYDROcodone-acetaminophen (NORCO/VICODIN) 5-325 MG tablet Take 1-2 tablets by mouth every 6 (six) hours as needed (breakthrough pain). ?Patient not taking: Reported on 07/09/2021 04/02/21   Terrilee Croak, MD  ?HYDROmorphone (DILAUDID) 2 MG tablet Take 0.5 tablets (1 mg total) by mouth every 6 (six) hours as needed for moderate pain. ?Patient not taking: Reported on 07/09/2021 04/02/21   Terrilee Croak, MD  ?magic mouthwash w/lidocaine SOLN Take 2 mLs by mouth 3 (three) times daily as needed for mouth pain. ?Patient not taking: Reported on 07/09/2021 04/02/21   Terrilee Croak, MD  ?meropenem 1 g in sodium chloride 0.9 % 100 mL Inject 1 g into the vein every 8 (eight) hours. 04/02/21   Terrilee Croak, MD  ?multivitamin-lutein (OCUVITE-LUTEIN) CAPS capsule Take 1 capsule by mouth daily. ?Patient not taking: Reported on 07/09/2021 04/03/21   Terrilee Croak, MD  ?polyethylene glycol (MIRALAX / GLYCOLAX) 17 g packet Take 17 g by mouth daily as needed for moderate constipation. ?Patient not taking: Reported on 07/09/2021 04/02/21   Terrilee Croak, MD  ?traZODone (DESYREL) 50 MG tablet Take 1 tablet  (50 mg total) by mouth at bedtime as needed for sleep. ?Patient not taking: Reported on 07/09/2021 04/02/21   Terrilee Croak, MD  ?valACYclovir (VALTREX) 500 MG tablet Take 1 tablet (500 mg total) by mouth 2 (two)

## 2021-07-12 NOTE — Progress Notes (Signed)
Nutrition Follow Up Note  ? ?DOCUMENTATION CODES:  ? ?Non-severe (moderate) malnutrition in context of chronic illness ? ?INTERVENTION:  ? ?Ensure Max protein supplement BID, each supplement provides 150kcal and 30g of protein. ? ?Ocuvite daily for wound healing (provides zinc, vitamin A, vitamin C, Vitamin E, copper, and selenium) ? ?Juven Fruit Punch BID, each serving provides 95kcal and 2.5g of protein (amino acids glutamine and arginine) ? ?Pt at high moderate risk; recommend monitor potassium, magnesium and phosphorus labs daily until stable ? ?Vitamin B12 1044mg po daily  ? ?Vitamin C 5070mpo BID ? ?Zinc 22041mo daily x 14 days  ? ?Vitamin D 50,000 units po weekly x 8 weeks  ? ?Vitamin E, vitamin A, vitamin C, zinc and copper labs pending.  ? ?NUTRITION DIAGNOSIS:  ? ?Moderate Malnutrition related to chronic illness (non healing sacral wound, paraplegia) as evidenced by moderate fat depletion, moderate muscle depletion, severe muscle depletion. ? ?GOAL:  ? ?Patient will meet greater than or equal to 90% of their needs ?-progressing  ? ?MONITOR:  ? ?PO intake, Supplement acceptance, Labs, Weight trends, Skin, I & O's ? ?ASSESSMENT:  ? ?46 24o. male with medical history significant of paraplegia, neurogenic bladder, s/p suprapubic catheter, chronic severe sacral ulcer, anemia, malnutrition, anxiety, UTI and chronic right hip dislocation by CT scan on 06/25/2021 who presents with sacral ulcer with infection and E. faecalis bacteremia. ? ?Met with pt in room today. Pt reports fair appetite and oral intake in hospital; pt documented to be eating 20-50% of meals in hospital. Pt's lunch tray was sitting on pt's side table untouched. Pt's Ensure Max is sitting on his side table untouched. Pt reports that he is not drinking the Juven supplements. Pt also reports that he is tired of taking pills. RD discussed again with patient the importance of adequate nutrition needed to support wound healing and to preserve lean  muscle. Pt has a h/o non-compliance with taking supplements and vitamins. Vitamin labs sent out to determine if levels were high enough to support healing. Vitamin D and B12 labs have returned low and are being supplemented; other vitamin labs are still pending. RD discussed with pt the importance of taking the vitamins so he can heal. Pt diagnosed with moderate malnutrition and is extremely close to a diagnosis of severe malnutrition. Pt was seen by Palliative care today and reported that he would take a G-tube if needed; this was discussed with pt today. Pt reports that he does not want a feeding tube unless its a last resort. RD explained to patient that he is  nearing this being the point of a last resort as he is malnourished, has a non healing wound and declines any interventions involving amputations. Recommend G-tube to help pt meet his estimated needs to support wound healing. Explained that feeding tube is not permanent and can be removed once pt's wound is healed. Also explained that patient would be able eat with the tube in. Pt declined to hear anything about tube placement or what it involved and reports that he does not want the tube. RD will try and maximize wound healing with supplements and vitamins; however, this will only be a benefit to patient if he is compliant. No new weight since admit; pt is in a wound care bed that does not have a scale. RD will monitor vitamin labs and replete as needed.  ? ?Medications reviewed and include: B12, ocuvite, juven, protonix, vitamin D, KCl, unasyn, iron sucrose, KCl ? ?Labs  reviewed: K 3.1(L), BUN <5(L), creat 0.44(L), Mg 2,1 wnl ?Vitamin D 14.0(L), B12 162(L) ?Wbc- 3.3(L), Hgb 7.2(L), Hct 24.3(L), MCV 75.7(L), MCH 22.4(L), MCHC 29.6(L) ? ?Diet Order:   ?Diet Order   ? ?       ?  Diet regular Room service appropriate? Yes; Fluid consistency: Thin  Diet effective now       ?  ? ?  ?  ? ?  ? ?EDUCATION NEEDS:  ? ?Education needs have been addressed ? ?Skin:   Skin Assessment: Reviewed RN Assessment (Stage 4 pressure injuries to the bilateral hips and sacrum with osteomyelitis and dislocation of hips.) ? ?Last BM:  3/14- type 7 ? ?Height:  ? ?Ht Readings from Last 1 Encounters:  ?07/10/21 5' 10"  (1.778 m)  ? ? ?Weight:  ? ?Wt Readings from Last 1 Encounters:  ?07/09/21 47.9 kg  ? ? ?Ideal Body Weight:  75.4 kg ? ?BMI:  Body mass index is 15.15 kg/m?. ? ?Estimated Nutritional Needs:  ? ?Kcal:  1900-2200kcal/day ? ?Protein:  95-110g/day ? ?Fluid:  1.5-1.7L/day ? ?Koleen Distance MS, RD, LDN ?Please refer to [facility] for RD and/or RD on-call/weekend/after hours pager ? ?

## 2021-07-12 NOTE — Progress Notes (Signed)
? ? ? ? ?  Patient's case discussed with Juliette Alcide, Pharm D ? ?He has developed leukopenia and neutropenia which we are worried could be due to the beta-lactam. ? ?If this is not improved we may need to change him from Unasyn which is active against his organism to another antibiotic potentially daptomycin for once daily dosing. ? ?His extensive osteomyelitis is as I have mentioned several times not curable absent morbid surgery that he is going to refuse. ? ?We will check back in on him tomorrow ? ? ? ?Paulette Blanch Dam ?07/12/2021, 4:27 PM ? ?

## 2021-07-12 NOTE — Progress Notes (Addendum)
? ? ? ?Progress Note  ? ? ?Jonathon York  [id number] DOB: 1975/05/08  DOA: 07/09/2021 ?PCP: Leonel Ramsay, MD  ? ? ? ? ?Brief Narrative:  ? ? ?Medical records reviewed and are as summarized below: ? ?Jonathon York is a 46 y.o. male with medical history significant for paraplegia, neurogenic bladder, s/p suprapubic catheter, chronic severe sacral decubitus ulcers, chronic anemia, malnutrition, anxiety, UTI, chronic right hip dislocation.  He was discharged from [facility] on 04/02/2021 after hospitalization for septic shock, strep anginosus bacteremia, stage IV sacral decubitus ulcers and osteomyelitis of bilateral hips with involvement of the sacrum and coccyx.  He underwent debridement of the sacral wound at that time.  Pseudomonas aeruginosa was isolated from the wound.  He was subsequently discharged on IV meropenem, to [facility] where he stayed for  about 3 months.  He said he had a right IJ central line placed on 07/01/2021 and he was discharged home on 07/03/2021 from the [facility] hospital.  However, he said that he had not been taking the IV antibiotics because he did not have any output or and also he did not have any electricity or water at home.   ? ?He had increasing pain in the hip and sacral wounds and noticed increasing drainage from the sacral wounds as well.  He was referred to the hospital for direct admission by Dr. Ola Spurr, his PCP and ID physician, for evaluation of  worsening sacral decubitus ulcer and bilateral hip osteomyelitis.  He has been following up with ID for his sacral ulcer but this has gotten worse. ? ? ?He was found to have Enterococcus bacteremia on this admission.  Dr. Windell Moment, general surgeon, was consulted but from his standpoint, there was no indication for debridement.  Dr. Sharlet Salina, orthopedic surgeon, was also consulted to assist with management.  He recommended referral to a tertiary center for further management because of the complexity of the  case. ? ? ? ? ? ?Assessment/Plan:  ? ?Principal Problem: ?  Enterococcus faecalis infection ?Active Problems: ?  Sacral decubitus ulcer, stage IV ([place]) ?  Paraplegia ([place]) ?  Neurogenic bladder ?  Protein-calorie malnutrition, severe ?  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[place]) ?  Microcytic anemia ?  GERD (gastroesophageal reflux disease) ?  Diarrhea ? ? ?Nutrition Problem: Moderate Malnutrition ?Etiology: chronic illness (non healing sacral wound, paraplegia) ? ?Signs/Symptoms: moderate fat depletion, moderate muscle depletion, severe muscle depletion ? ? ?Body mass index is 15.15 kg/m?.  ? ? ?Enterococcus faecalis bacteremia, ?Left hip osteomyelitis, right hip osteomyelitis with chronic right hip dislocation, chronic bony destruction of the lower sacrum and coccyx, stage IV sacral decubitus ulcers ?Continue IV Unasyn.  No vegetations on 2D echo. ?Orthopedic surgeon, Dr. Sharlet Salina, recommended transfer to a tertiary center for further evaluation.  Follow-up with ID. ? ?Leukopenia/neutropenia ?Probably from IV Unasyn.  Monitor CBC closely. ? ? ?Acute on chronic anemia, vitamin B12 deficiency ?S/p transfusion with 1 unit of PRBCs.  Start IV iron infusion.  Continue vitamin B12 injection. ? ? ?Hypokalemia ?He has been refusing oral potassium chloride.  Replete potassium intravenously. ? ? ?Paraplegia, neurogenic bladder ?Suprapubic catheter in place ? ? ?Severe protein caloric malnutrition, vitamin D deficiency ?Started high-dose vitamin D ?Continue nutritional supplements.  Follow-up with dietitian ? ? ? ?I spoke to Dr. Aretha Parrot, orthopedic surgeon, at [facility], on 07/11/2021 at 4:40 pm. He will be happy to see the patient in consultation when he gets to [facility].  However,  he requested that I contact the hospitalist team so the patient will be at the admitted under the hospitalist service. I was told by the transfer coordinator on the [facility] PAL line that there are no beds and wanted  me to call back again to see if there are beds available. ? ?I called [place] PAL line at 8:02 AM on 07/12/2021.  I spoke to [place], Scientist, research (life sciences).  She said there were no beds and advised that I call back.  I called back at 3:55 PM and spoke to [place] again regarding bed availability.  She said they are still at capacity and do not have a bed for Jonathon York. ? ?I called [facility] at 4:15 PM on 07/12/2021 to initiate transfer.  Unfortunately they have no beds. ? ? ? ?Diet Order   ? ?       ?  Diet regular Room service appropriate? Yes; Fluid consistency: Thin  Diet effective now       ?  ? ?  ?  ? ?  ? ? ? ?Consultants: ?General surgeon ?Orthopedic surgeon ?Infectious disease ? ?Procedures: ?None ? ? ? ?Medications:  ? ? vitamin C  500 mg Oral BID  ? Chlorhexidine Gluconate Cloth  6 each Topical Daily  ? cyanocobalamin  1,000 mcg Intramuscular Daily  ? multivitamin-lutein  1 capsule Oral Daily  ? nutrition supplement (JUVEN)  1 packet Oral BID BM  ? pantoprazole  40 mg Oral Q1200  ? Ensure Max Protein  11 oz Oral BID  ? sodium chloride flush  10-40 mL Intracatheter Q12H  ? [START ON 07/15/2021] vitamin B-12  1,000 mcg Oral Daily  ? Vitamin D (Ergocalciferol)  50,000 Units Oral Q7 days  ? [START ON 07/13/2021] zinc sulfate  220 mg Oral Daily  ? ?Continuous Infusions: ? ampicillin-sulbactam (UNASYN) IV Stopped (07/12/21 1201)  ? iron sucrose Stopped (07/12/21 1546)  ? potassium chloride 100 mL/hr at 07/12/21 1559  ? ? ? ?Anti-infectives (From admission, onward)  ? ? Start     Dose/Rate Route Frequency Ordered Stop  ? 07/10/21 1800  vancomycin (VANCOREADY) IVPB 1250 mg/250 mL  Status:  Discontinued       ? 1,250 mg ?166.7 mL/hr over 90 Minutes Intravenous Every 24 hours 07/09/21 1747 07/10/21 1125  ? 07/10/21 1230  Ampicillin-Sulbactam (UNASYN) 3 g in sodium chloride 0.9 % 100 mL IVPB       ? 3 g ?200 mL/hr over 30 Minutes Intravenous Every 6 hours 07/10/21 1125    ? 07/09/21 1900  meropenem  (MERREM) 1 g in sodium chloride 0.9 % 100 mL IVPB  Status:  Discontinued       ? 1 g ?200 mL/hr over 30 Minutes Intravenous Every 8 hours 07/09/21 1747 07/10/21 1125  ? 07/09/21 1830  vancomycin (VANCOCIN) IVPB 1000 mg/200 mL premix       ? 1,000 mg ?200 mL/hr over 60 Minutes Intravenous  Once 07/09/21 1725 07/09/21 2214  ? ?  ? ? ? ? ? ? ? ? ? ?Family Communication/Anticipated D/C date and plan/Code Status  ? ?DVT prophylaxis: Place and maintain sequential compression device Start: 07/10/21 0828 ? ? ?  Code Status: Full Code ? ?Family Communication: None ?Disposition Plan: Plan to transfer to tertiary center for further management ? ? ?Status is: Inpatient ?Remains inpatient appropriate because: IV antibiotics ? ? ? ? ? ? ?Subjective:  ? ?C/o lower back pain and buttock pain from sacral wounds.  Crystal,  NP, from palliative care was at the bedside. ? ?Objective:  ? ? ?Vitals:  ? 07/12/21 0630 07/12/21 0631 07/12/21 0745 07/12/21 1519  ?BP:  (!) 91/57 95/64 (!) 90/59  ?Pulse:  75 80 82  ?Resp:  18 14 16   ?Temp:  97.9 ?F (36.6 ?C) 97.7 ?F (36.5 ?C) 98 ?F (36.7 ?C)  ?TempSrc: Oral     ?SpO2:  98% 98% 97%  ?Weight:      ?Height:      ? ?No data found. ? ? ?Intake/Output Summary (Last 24 hours) at 07/12/2021 1630 ?Last data filed at 07/12/2021 1559 ?Gross per 24 hour  ?Intake 1177.21 ml  ?Output 1350 ml  ?Net -172.79 ml  ? ?Filed Weights  ? 07/09/21 1646  ?Weight: 47.9 kg  ? ? ?Exam: ? ?GEN: NAD ?SKIN: Warm and dry.  Stage IV sacral decubitus ulcers ?EYES: No pallor or icterus ?ENT: MMM ?CV: RRR ?PULM: CTA B ?ABD: soft, ND, NT, +BS ?CNS: AAO x 3, paraplegic ?EXT: No edema or tenderness ?GU: Suprapubic catheter draining amber urine ? ? ? ? ? ? ?Data Reviewed:  ? ?I have personally reviewed following labs and imaging studies: ? ?Labs: ?Labs show the following:  ? ?Basic Metabolic Panel: ?Recent Labs  ?Lab 07/09/21 ?1856 07/10/21 ?[id number] 07/12/21 ?[id number]  ?NA 138 138 140  ?K 3.2* 2.9* 3.1*  ?CL 107 110 107  ?CO2 22 22 26    ?GLUCOSE 112* 97 89  ?BUN 7 6 <5*  ?CREATININE 0.51* 0.40* 0.44*  ?CALCIUM 7.4* 7.5* 7.6*  ?MG  --   --  2.1  ? ?GFR ?Estimated Creatinine Clearance: 78.2 mL/min (A) (by C-G formula based on SCr of 0.44 mg/dL (L)). ?Liv

## 2021-07-13 DIAGNOSIS — N319 Neuromuscular dysfunction of bladder, unspecified: Secondary | ICD-10-CM | POA: Diagnosis not present

## 2021-07-13 DIAGNOSIS — L89514 Pressure ulcer of right ankle, stage 4: Secondary | ICD-10-CM

## 2021-07-13 DIAGNOSIS — Z7189 Other specified counseling: Secondary | ICD-10-CM | POA: Diagnosis not present

## 2021-07-13 DIAGNOSIS — L89154 Pressure ulcer of sacral region, stage 4: Secondary | ICD-10-CM | POA: Diagnosis not present

## 2021-07-13 DIAGNOSIS — E43 Unspecified severe protein-calorie malnutrition: Secondary | ICD-10-CM | POA: Diagnosis not present

## 2021-07-13 DIAGNOSIS — G8220 Paraplegia, unspecified: Secondary | ICD-10-CM | POA: Diagnosis not present

## 2021-07-13 DIAGNOSIS — R7881 Bacteremia: Secondary | ICD-10-CM

## 2021-07-13 DIAGNOSIS — B952 Enterococcus as the cause of diseases classified elsewhere: Secondary | ICD-10-CM | POA: Diagnosis not present

## 2021-07-13 DIAGNOSIS — M86651 Other chronic osteomyelitis, right thigh: Secondary | ICD-10-CM

## 2021-07-13 LAB — CBC WITH DIFFERENTIAL/PLATELET
Abs Immature Granulocytes: 0.04 10*3/uL (ref 0.00–0.07)
Basophils Absolute: 0 10*3/uL (ref 0.0–0.1)
Basophils Relative: 1 %
Eosinophils Absolute: 0.1 10*3/uL (ref 0.0–0.5)
Eosinophils Relative: 1 %
HCT: 26.2 % — ABNORMAL LOW (ref 39.0–52.0)
Hemoglobin: 7.5 g/dL — ABNORMAL LOW (ref 13.0–17.0)
Immature Granulocytes: 1 %
Lymphocytes Relative: 19 %
Lymphs Abs: 1 10*3/uL (ref 0.7–4.0)
MCH: 22.2 pg — ABNORMAL LOW (ref 26.0–34.0)
MCHC: 28.6 g/dL — ABNORMAL LOW (ref 30.0–36.0)
MCV: 77.5 fL — ABNORMAL LOW (ref 80.0–100.0)
Monocytes Absolute: 0.4 10*3/uL (ref 0.1–1.0)
Monocytes Relative: 8 %
Neutro Abs: 3.8 10*3/uL (ref 1.7–7.7)
Neutrophils Relative %: 70 %
Platelets: 308 10*3/uL (ref 150–400)
RBC: 3.38 MIL/uL — ABNORMAL LOW (ref 4.22–5.81)
RDW: 17.6 % — ABNORMAL HIGH (ref 11.5–15.5)
WBC: 5.3 10*3/uL (ref 4.0–10.5)
nRBC: 0 % (ref 0.0–0.2)

## 2021-07-13 LAB — BASIC METABOLIC PANEL
Anion gap: 7 (ref 5–15)
BUN: 6 mg/dL (ref 6–20)
CO2: 25 mmol/L (ref 22–32)
Calcium: 7.7 mg/dL — ABNORMAL LOW (ref 8.9–10.3)
Chloride: 108 mmol/L (ref 98–111)
Creatinine, Ser: 0.5 mg/dL — ABNORMAL LOW (ref 0.61–1.24)
GFR, Estimated: >60 mL/min (ref >60)
Glucose, Bld: 123 mg/dL — ABNORMAL HIGH (ref 70–99)
Potassium: 3.4 mmol/L — ABNORMAL LOW (ref 3.5–5.1)
Sodium: 140 mmol/L (ref 135–145)

## 2021-07-13 LAB — MAGNESIUM: Magnesium: 2.3 mg/dL (ref 1.7–2.4)

## 2021-07-13 LAB — PHOSPHORUS: Phosphorus: 4.4 mg/dL (ref 2.5–4.6)

## 2021-07-13 MED ORDER — POTASSIUM CHLORIDE 10 MEQ/100ML IV SOLN
10.0000 meq | INTRAVENOUS | Status: AC
  Administered 2021-07-13 – 2021-07-14 (×4): 10 meq via INTRAVENOUS
  Filled 2021-07-13 (×4): qty 100

## 2021-07-13 NOTE — Progress Notes (Signed)
? ?                                                                                                                                                     ?                                                   ?Daily Progress Note  ? ?Patient Name: Jonathon LeedsRichard L Janssen       Date: 07/13/2021 ?DOB: 1975/05/01  Age: 46 y.o. MRN#: [id number] ?Attending Physician: Lurene ShadowAyiku, Bernard, MD ?Primary Care Physician: Mick SellFitzgerald, David P, MD ?Admit Date: 07/09/2021 ? ?Reason for Consultation/Follow-up: Establishing goals of care ? ?Subjective: ?Notes, diagnostics, and labs reviewed. Discussed his status. Patient states he does not believe he has osteomyelitis. He states he was not told this in rehab, where most recent imaging was done. Reviewed MRI results from 2/1 with him. He states he only laid in the scanner 5-10 minutes before telling them to stop. He states at that time, the staff told him they weren't able to get the imaging they needed.  He states no imaging has been done since his imaging at rehab, and this admission is the first he has heard about the osteomyelitis or about amputation. I asked if he knew for sure he did have osteomyelitis, would it in any way affect his decision making, and he stated it would not. He states [facility] [place].   ? ?Length of Stay: 4 ? ?Current Medications: ?Scheduled Meds:  ? vitamin C  500 mg Oral BID  ? Chlorhexidine Gluconate Cloth  6 each Topical Daily  ? cyanocobalamin  1,000 mcg Intramuscular Daily  ? multivitamin-lutein  1 capsule Oral Daily  ? nutrition supplement (JUVEN)  1 packet Oral BID BM  ? pantoprazole  40 mg Oral Q1200  ? Ensure Max Protein  11 oz Oral BID  ? sodium chloride flush  10-40 mL Intracatheter Q12H  ? [START ON 07/15/2021] vitamin B-12  1,000 mcg Oral Daily  ? Vitamin D (Ergocalciferol)  50,000 Units Oral Q7 days  ? zinc sulfate  220 mg Oral Daily  ? ? ?Continuous Infusions: ? ampicillin-sulbactam (UNASYN) IV 3 g (07/13/21 0535)  ?  iron sucrose 300 mg (07/13/21 1002)  ? ? ?PRN Meds: ?acetaminophen, diphenhydrAMINE, loperamide, morphine injection, ondansetron (ZOFRAN) IV, oxyCODONE-acetaminophen, sodium chloride flush, traZODone ? ?Physical Exam ?Pulmonary:  ?   Effort: Pulmonary effort is normal.  ?Neurological:  ?   Mental Status: He is alert.  ?         ? ?Vital Signs: BP (!) 82/52 (BP Location: Right Arm)   Pulse 82   Temp  98.6 ?F (37 ?C)   Resp 18   Ht 5\' 10"  (1.778 m)   Wt 47.9 kg   SpO2 98%   BMI 15.15 kg/m?  ?SpO2: SpO2: 98 % ?O2 Device: O2 Device: Room Air ?O2 Flow Rate:   ? ?Intake/output summary:  ?Intake/Output Summary (Last 24 hours) at 07/13/2021 1200 ?Last data filed at 07/13/2021 1024 ?Gross per 24 hour  ?Intake 1017.21 ml  ?Output 1850 ml  ?Net -832.79 ml  ? ?LBM: Last BM Date : 07/13/21 ?Baseline Weight: Weight: 47.9 kg ?Most recent weight: Weight: 47.9 kg ? ?  ? ?Patient Active Problem List  ? Diagnosis Date Noted  ? Enterococcus faecalis infection 07/12/2021  ? Microcytic anemia 07/09/2021  ? GERD (gastroesophageal reflux disease) 07/09/2021  ? Diarrhea 07/09/2021  ? Acute on chronic anemia   ? AKI (acute kidney injury) ([place])   ? Urinary retention 02/10/2021  ? Nausea and vomiting 08/29/2020  ? 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 Generalized abdominal pain   ? UTI (urinary tract infection) 12/02/2019  ? UTI (urinary tract infection) due to urinary indwelling catheter ([place]) 12/01/2019  ? Infected decubitus ulcer 10/27/2019  ? 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[place]) 10/27/2019  ? Bilateral leg edema 10/27/2019  ? 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 Constipation   ? Iron deficiency anemia   ? Sacral decubitus ulcer, stage IV ([place]) 07/13/2019  ? Intractable pain 11/26/2018  ? Anxiety 04/22/2018  ? Acute osteomyelitis of right foot ([place]) 03/18/2018  ? Pressure injury of skin 03/18/2018  ? Stage IV pressure ulcer of right buttock ([place])   ? Protein-calorie malnutrition, severe 11/20/2016  ? Herpes simplex infection of penis   ? Decubitus  ulcer 11/19/2016  ? 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 Decubitus ulcer of sacral region   ? Sepsis ([place]) 04/15/2016  ? 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 Pressure ulcer stage III 04/30/2015  ? Sterile pyuria 04/30/2015  ? Paraplegia ([place]) 04/30/2015  ? Neurogenic bladder 04/30/2015  ? Toe osteomyelitis, right ([place]) 04/28/2015  ? Moderate malnutrition ([place]) 04/28/2015  ? ? ?Palliative Care Assessment & Plan  ? ?Recommendations/Plan: ?Continue current care, please see previous note for details.  ?Continues to desire and await transfer to tertiary facility.  ?PMT will shadow for decline.  ? ?Code Status: ? ?  ?Code Status Orders  ?(From admission, onward)  ?  ? ? ?  ? ?  Start     Ordered  ? 07/09/21 1646  Full code  Continuous       ? 07/09/21 1646  ? ?  ?  ? ?  ? ?Code Status History   ? ? Date Active Date Inactive Code Status Order ID Comments User Context  ? 04/02/2021 1945 07/03/2021 1933 Full Code [id number]  Maudry Diego Inpatient  ? 03/21/2021 2240 04/02/2021 1650 Full Code [id number]  Bradly Bienenstock, NP ED  ? 02/10/2021 1518 02/12/2021 2248 Full Code [id number]  Karmen Bongo, MD ED  ? 08/29/2020 0117 09/02/2020 1811 Full Code [id number]  Athena Masse, MD ED  ? 12/01/2019 0831 12/05/2019 2347 Full Code [id number]  Collier Bullock, MD ED  ? 10/27/2019 2117 10/28/2019 2137 Full Code [id number]  Ivor Costa, MD Inpatient  ? 07/13/2019 1627 07/16/2019 2003 Full Code [id number]  Wyvonnia Dusky, MD ED  ? 11/26/2018 0411 11/30/2018 2011 Full Code [id number]  Loletha Grayer, MD ED  ? 04/22/2018 2308 04/23/2018 1622 Full Code [id number]  Lance Coon, MD Inpatient  ?  03/18/2018 1119 03/23/2018 1659 Full Code [id number]  Henreitta Leber, MD ED  ? 02/14/2017 2321 02/19/2017 2046 Full Code [id number]  Lance Coon, MD Inpatient  ? 11/19/2016 1720 11/21/2016 2207 Full Code [id number]  Dustin Flock, MD Inpatient  ? 04/15/2016 1802 04/19/2016 0211 Full Code  [id number]  Demetrios Loll, MD Inpatient  ? 04/29/2015 1017 04/30/2015 1339 Full Code [id number]  Sharlotte Alamo, MD Inpatient  ? 04/28/2015 0438 04/29/2015 1017 Full Code [id number]  Hillary Bow, MD ED  ? ?  ? ? ?Thank you for allowing the Palliative Medicine Team to assist in the care of this patient. ? ? ?Asencion Gowda, NP ? ?Please contact Palliative Medicine Team phone at [phone] for questions and concerns.  ? ? ? ? ? ?

## 2021-07-13 NOTE — Progress Notes (Signed)
Virtual Visit via Video Note  I connected with Jonathon York on 07/13/2021 at  by a video enabled telemedicine application and verified that I am speaking with the correct person using two identifiers.  Location: Patient:  [facility] Room 226 Provider: Home   Virtual Visit via Video Note     I discussed the limitations of evaluation and management by telemedicine and the availability of in person appointments. The patient expressed understanding and agreed to proceed.          Subjective: No new complaints   Antibiotics:  Anti-infectives (From admission, onward)    Start     Dose/Rate Route Frequency Ordered Stop   07/10/21 1800  vancomycin (VANCOREADY) IVPB 1250 mg/250 mL  Status:  Discontinued        1,250 mg 166.7 mL/hr over 90 Minutes Intravenous Every 24 hours 07/09/21 1747 07/10/21 1125   07/10/21 1230  Ampicillin-Sulbactam (UNASYN) 3 g in sodium chloride 0.9 % 100 mL IVPB        3 g 200 mL/hr over 30 Minutes Intravenous Every 6 hours 07/10/21 1125     07/09/21 1900  meropenem (MERREM) 1 g in sodium chloride 0.9 % 100 mL IVPB  Status:  Discontinued        1 g 200 mL/hr over 30 Minutes Intravenous Every 8 hours 07/09/21 1747 07/10/21 1125   07/09/21 1830  vancomycin (VANCOCIN) IVPB 1000 mg/200 mL premix        1,000 mg 200 mL/hr over 60 Minutes Intravenous  Once 07/09/21 1725 07/09/21 2214       Medications: Scheduled Meds:  vitamin C  500 mg Oral BID   Chlorhexidine Gluconate Cloth  6 each Topical Daily   cyanocobalamin  1,000 mcg Intramuscular Daily   multivitamin-lutein  1 capsule Oral Daily   nutrition supplement (JUVEN)  1 packet Oral BID BM   pantoprazole  40 mg Oral Q1200   Ensure Max Protein  11 oz Oral BID   sodium chloride flush  10-40 mL Intracatheter Q12H   [START ON 07/15/2021] vitamin B-12  1,000 mcg Oral Daily   Vitamin D (Ergocalciferol)  50,000 Units Oral Q7 days   zinc sulfate  220 mg Oral Daily   Continuous Infusions:   ampicillin-sulbactam (UNASYN) IV 3 g (07/13/21 1302)   iron sucrose 300 mg (07/13/21 1002)   PRN Meds:.acetaminophen, diphenhydrAMINE, loperamide, morphine injection, ondansetron (ZOFRAN) IV, oxyCODONE-acetaminophen, sodium chloride flush, traZODone    Objective: Weight change:   Intake/Output Summary (Last 24 hours) at 07/13/2021 1748 Last data filed at 07/13/2021 1433 Gross per 24 hour  Intake 480 ml  Output 1850 ml  Net -1370 ml   Blood pressure (!) 94/55, pulse 75, temperature 98.3 F (36.8 C), temperature source Oral, resp. rate 18, height 5\' 10"  (1.778 m), weight 47.9 kg, SpO2 99 %. Temp:  [98.3 F (36.8 C)-98.8 F (37.1 C)] 98.3 F (36.8 C) (03/17 1600) Pulse Rate:  [75-93] 75 (03/17 1600) Resp:  [16-18] 18 (03/17 1600) BP: (82-100)/(51-73) 94/55 (03/17 1600) SpO2:  [98 %-99 %] 99 % (03/17 1600)  Physical Exam: Physical Exam HENT:     Head: Normocephalic and atraumatic.  Eyes:     General:        Right eye: No discharge.        Left eye: No discharge.     Extraocular Movements: Extraocular movements intact.  Pulmonary:     Effort: Pulmonary effort is normal. No respiratory distress.  Breath sounds: No wheezing.  Abdominal:     General: There is no distension.  Skin:    Coloration: Skin is pale.  Neurological:     Mental Status: He is alert and oriented to person, place, and time.  Psychiatric:        Attention and Perception: Attention normal.        Mood and Affect: Mood is depressed.        Speech: Speech normal.        Behavior: Behavior normal.        Cognition and Memory: Cognition and memory normal.        Judgment: Judgment normal.     CBC:    BMET Recent Labs    07/12/21 0633 07/13/21 1441  NA 140 140  K 3.1* 3.4*  CL 107 108  CO2 26 25  GLUCOSE 89 123*  BUN <5* 6  CREATININE 0.44* 0.50*  CALCIUM 7.6* 7.7*     Liver Panel  No results for input(s): PROT, ALBUMIN, AST, ALT, ALKPHOS, BILITOT, BILIDIR, IBILI in the last 72  hours.     Sedimentation Rate No results for input(s): ESRSEDRATE in the last 72 hours. C-Reactive Protein No results for input(s): CRP in the last 72 hours.  Micro Results: Recent Results (from the past 720 hour(s))  CULTURE, BLOOD (ROUTINE X 2) w Reflex to ID Panel     Status: Abnormal   Collection Time: 07/09/21  6:56 PM   Specimen: BLOOD  Result Value Ref Range Status   Specimen Description   Final    BLOOD LEFT ASSIST CONTROL Performed at [facility], [address] [place], [place] [address]    Special Requests   Final    BOTTLES DRAWN AEROBIC AND ANAEROBIC Blood Culture adequate volume Performed at [facility], [address] [place], [place] [address]    Culture  Setup Time   Final    GRAM POSITIVE COCCI AEROBIC BOTTLE ONLY Organism ID to follow CRITICAL RESULT CALLED TO, READ BACK BY AND VERIFIED WITHDelight Stare [facility] 0981 07/10/21 HNM Performed at [facility], [address], [place], [place] [address]    Culture ENTEROCOCCUS FAECALIS (A)  Final   Report Status 07/12/2021 FINAL  Final   Organism ID, Bacteria ENTEROCOCCUS FAECALIS  Final      Susceptibility   Enterococcus faecalis - MIC*    AMPICILLIN <=2 SENSITIVE Sensitive     VANCOMYCIN 1 SENSITIVE Sensitive     GENTAMICIN SYNERGY SENSITIVE Sensitive     * ENTEROCOCCUS FAECALIS  CULTURE, BLOOD (ROUTINE X 2) w Reflex to ID Panel     Status: None (Preliminary result)   Collection Time: 07/09/21  6:56 PM   Specimen: BLOOD  Result Value Ref Range Status   Specimen Description BLOOD LEFT HAND  Final   Special Requests   Final    BOTTLES DRAWN AEROBIC AND ANAEROBIC Blood Culture adequate volume   Culture   Final    NO GROWTH 4 DAYS Performed at [facility], [address], [place], [place] [address]    Report Status PENDING  Incomplete  Blood Culture ID Panel (Reflexed)     Status: Abnormal   Collection Time: 07/09/21  6:56 PM  Result Value Ref Range Status    Enterococcus faecalis DETECTED (A) NOT DETECTED Final    Comment: CRITICAL RESULT CALLED TO, READ BACK BY AND VERIFIED WITHDelight Stare [facility] 2130 07/10/21 HNM    Enterococcus Faecium NOT DETECTED NOT  DETECTED Final   Listeria monocytogenes NOT DETECTED NOT DETECTED Final   Staphylococcus species NOT DETECTED NOT DETECTED Final   Staphylococcus aureus (BCID) NOT DETECTED NOT DETECTED Final   Staphylococcus epidermidis NOT DETECTED NOT DETECTED Final   Staphylococcus lugdunensis NOT DETECTED NOT DETECTED Final   Streptococcus species NOT DETECTED NOT DETECTED Final   Streptococcus agalactiae NOT DETECTED NOT DETECTED Final   Streptococcus pneumoniae NOT DETECTED NOT DETECTED Final   Streptococcus pyogenes NOT DETECTED NOT DETECTED Final   A.calcoaceticus-baumannii NOT DETECTED NOT DETECTED Final   Bacteroides fragilis NOT DETECTED NOT DETECTED Final   Enterobacterales NOT DETECTED NOT DETECTED Final   Enterobacter cloacae complex NOT DETECTED NOT DETECTED Final   Escherichia coli NOT DETECTED NOT DETECTED Final   Klebsiella aerogenes NOT DETECTED NOT DETECTED Final   Klebsiella oxytoca NOT DETECTED NOT DETECTED Final   Klebsiella pneumoniae NOT DETECTED NOT DETECTED Final   Proteus species NOT DETECTED NOT DETECTED Final   Salmonella species NOT DETECTED NOT DETECTED Final   Serratia marcescens NOT DETECTED NOT DETECTED Final   Haemophilus influenzae NOT DETECTED NOT DETECTED Final   Neisseria meningitidis NOT DETECTED NOT DETECTED Final   Pseudomonas aeruginosa NOT DETECTED NOT DETECTED Final   Stenotrophomonas maltophilia NOT DETECTED NOT DETECTED Final   Candida albicans NOT DETECTED NOT DETECTED Final   Candida auris NOT DETECTED NOT DETECTED Final   Candida glabrata NOT DETECTED NOT DETECTED Final   Candida krusei NOT DETECTED NOT DETECTED Final   Candida parapsilosis NOT DETECTED NOT DETECTED Final   Candida tropicalis NOT DETECTED NOT DETECTED Final   Cryptococcus  neoformans/gattii NOT DETECTED NOT DETECTED Final   Vancomycin resistance NOT DETECTED NOT DETECTED Final    Comment: Performed at [facility], [address] [place], [place] [address]  Resp Panel by RT-PCR (Flu A&B, Covid) Nasopharyngeal Swab     Status: None   Collection Time: 07/09/21  7:48 PM   Specimen: Nasopharyngeal Swab; Nasopharyngeal(NP) swabs in vial transport medium  Result Value Ref Range Status   SARS Coronavirus 2 by RT PCR NEGATIVE NEGATIVE Final    Comment: (NOTE) SARS-CoV-2 target nucleic acids are NOT DETECTED.  The SARS-CoV-2 RNA is generally detectable in upper respiratory specimens during the acute phase of infection. The lowest concentration of SARS-CoV-2 viral copies this assay can detect is 138 copies/mL. A negative result does not preclude SARS-Cov-2 infection and should not be used as the sole basis for treatment or other patient management decisions. A negative result may occur with  improper specimen collection/handling, submission of specimen other than nasopharyngeal swab, presence of viral mutation(s) within the areas targeted by this assay, and inadequate number of viral copies(<138 copies/mL). A negative result must be combined with clinical observations, patient history, and epidemiological information. The expected result is Negative.  Fact Sheet for Patients:  BloggerCourse.com  Fact Sheet for Healthcare Providers:  SeriousBroker.it  This test is no t yet approved or cleared by the [place] FDA and  has been authorized for detection and/or diagnosis of SARS-CoV-2 by FDA under an Emergency Use Authorization (EUA). This EUA will remain  in effect (meaning this test can be used) for the duration of the COVID-19 declaration under Section 564(b)(1) of the Act, 21 U.S.C.section 360bbb-3(b)(1), unless the authorization is terminated  or revoked sooner.       Influenza A by PCR  NEGATIVE NEGATIVE Final   Influenza B by PCR NEGATIVE NEGATIVE Final    Comment: (NOTE) The Xpert Xpress SARS-CoV-2/FLU/RSV plus  assay is intended as an aid in the diagnosis of influenza from Nasopharyngeal swab specimens and should not be used as a sole basis for treatment. Nasal washings and aspirates are unacceptable for Xpert Xpress SARS-CoV-2/FLU/RSV testing.  Fact Sheet for Patients: BloggerCourse.com  Fact Sheet for Healthcare Providers: SeriousBroker.it  This test is not yet approved or cleared by the [place] FDA and has been authorized for detection and/or diagnosis of SARS-CoV-2 by FDA under an Emergency Use Authorization (EUA). This EUA will remain in effect (meaning this test can be used) for the duration of the COVID-19 declaration under Section 564(b)(1) of the Act, 21 U.S.C. section 360bbb-3(b)(1), unless the authorization is terminated or revoked.  Performed at [facility], [name] [place], [place] [address]   CULTURE, BLOOD (ROUTINE X 2) w Reflex to ID Panel     Status: None (Preliminary result)   Collection Time: 07/11/21  2:58 PM   Specimen: BLOOD RIGHT HAND  Result Value Ref Range Status   Specimen Description BLOOD RIGHT HAND  Final   Special Requests   Final    BOTTLES DRAWN AEROBIC AND ANAEROBIC Blood Culture adequate volume   Culture   Final    NO GROWTH 2 DAYS Performed at [facility], [address], [place], [place] [address]    Report Status PENDING  Incomplete  CULTURE, BLOOD (ROUTINE X 2) w Reflex to ID Panel     Status: None (Preliminary result)   Collection Time: 07/11/21  3:04 PM   Specimen: BLOOD LEFT FOREARM  Result Value Ref Range Status   Specimen Description BLOOD LEFT FOREARM  Final   Special Requests   Final    BOTTLES DRAWN AEROBIC AND ANAEROBIC Blood Culture adequate volume   Culture   Final    NO GROWTH 2 DAYS Performed at [facility], [address], [place], [place] [address]    Report Status PENDING  Incomplete    Studies/Results: No results found.    Assessment/Plan:  INTERVAL HISTORY: WBC and ANC up again   Principal Problem:   Enterococcus faecalis infection Active Problems:   Paraplegia (HCC)   Neurogenic bladder   Protein-calorie malnutrition, severe   Sacral decubitus ulcer, stage IV (HCC)   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HCC)   Microcytic anemia   GERD (gastroesophageal reflux disease)   Diarrhea    Jonathon York is a 46 y.o. male with  extensive pelvic osteomyelitis as described previously that would require surgeries such as girdlestone (likley bilateral) to affect cure, having been on more than 3 months of IV merrem at [facility], and LTACH now admitted and found to have AMP S E faecalis bacteremia   #1 E faecalis bacteremia:  He needs 2 weeks of therapy currently on unasyn  #2 Leukopenia : has resolved fortunately  #3 Extensive osteomyelitis; if he refuses the surgeries he needs at tertiary care center then there is no path to cure and  GOC will be even more important  #4 Line in place: I am not personally comfortable supervising him having this at home but perhaps others are  Dr. Renold Don is available this weekend for questions and Dr. Drue Second will be covering next M, Tu with Dr. Rivka Safer back on Wednesday.   LOS: 4 days   Acey Lav 07/13/2021, 5:48 PM

## 2021-07-13 NOTE — Progress Notes (Addendum)
? ? ? ?Progress Note  ? ? ?Jonathon York  XMI:[id number] DOB: 02/26/1976  DOA: 07/09/2021 ?PCP: Mick Sell, MD  ? ? ? ? ?Brief Narrative:  ? ? ?Medical records reviewed and are as summarized below: ? ?Jonathon York is a 46 y.o. male with medical history significant for paraplegia, neurogenic bladder, s/p suprapubic catheter, chronic severe sacral decubitus ulcers, chronic anemia, malnutrition, anxiety, UTI, chronic right hip dislocation.  He was discharged from [facility] on 04/02/2021 after hospitalization for septic shock, strep anginosus bacteremia, stage IV sacral decubitus ulcers and osteomyelitis of bilateral hips with involvement of the sacrum and coccyx.  He underwent debridement of the sacral wound at that time.  Pseudomonas aeruginosa was isolated from the wound.  He was subsequently discharged on IV meropenem, to [facility] where he stayed for  about 3 months.  He said he had a right IJ central line placed on 07/01/2021 and he was discharged home on 07/03/2021 from the [facility] hospital.  However, he said that he had not been taking the IV antibiotics because he did not have any output or and also he did not have any electricity or water at home.   ? ?He had increasing pain in the hip and sacral wounds and noticed increasing drainage from the sacral wounds as well.  He was referred to the hospital for direct admission by Dr. Sampson Goon, his PCP and ID physician, for evaluation of  worsening sacral decubitus ulcer and bilateral hip osteomyelitis.  He has been following up with ID for his sacral ulcer but this has gotten worse. ? ? ?He was found to have Enterococcus bacteremia on this admission.  Dr. Hazle Quant, general surgeon, was consulted but from his standpoint, there was no indication for debridement.  Dr. Okey Dupre, orthopedic surgeon, was also consulted to assist with management.  He recommended referral to a tertiary center for further management because of the complexity of the  case. ? ? ? ? ? ?Assessment/Plan:  ? ?Principal Problem: ?  Enterococcus faecalis infection ?Active Problems: ?  Sacral decubitus ulcer, stage IV (HCC) ?  Paraplegia (HCC) ?  Neurogenic bladder ?  Protein-calorie malnutrition, severe ?  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HCC) ?  Microcytic anemia ?  GERD (gastroesophageal reflux disease) ?  Diarrhea ? ? ?Nutrition Problem: Moderate Malnutrition ?Etiology: chronic illness (non healing sacral wound, paraplegia) ? ?Signs/Symptoms: moderate fat depletion, moderate muscle depletion, severe muscle depletion ? ? ?Body mass index is 15.15 kg/m?.  ? ? ?Enterococcus faecalis bacteremia, ?Left hip osteomyelitis, right hip osteomyelitis with chronic right hip dislocation, chronic bony destruction of the lower sacrum and coccyx, stage IV sacral decubitus ulcers ?Blood culture from 07/09/2021 was positive for Enterococcus faecalis.  Continue IV Unasyn.  No vegetations on 2D echo.  No growth on repeat blood cultures thus far. ?Orthopedic surgeon, Dr. Okey Dupre, recommended transfer to a tertiary center for further evaluation.  Follow-up with ID. ? ?Leukopenia/neutropenia ?Improved ? ? ?Acute on chronic anemia, vitamin B12 deficiency ?S/p transfusion with 1 unit of PRBCs.  Continue IV iron infusion and  vitamin B12 injection. ? ? ?Hypokalemia ?Improving.  Continue potassium repletion intravenously.  He has been refusing oral potassium chloride.  ? ? ?Paraplegia, neurogenic bladder ?Suprapubic catheter in place ? ? ?Severe protein caloric malnutrition, vitamin D deficiency ?Started high-dose vitamin D on 07/12/2021 ?Continue nutritional supplements.  Follow-up with dietitian ? ? ? ?07/11/2021- I spoke to Dr. Caswell Corwin, orthopedic surgeon, at [facility], on 07/11/2021 at 4:40 pm. He  will be happy to see the patient in consultation when he gets to [facility].  However, he requested that I contact the hospitalist team so the patient will be at the admitted under  the hospitalist service. I was told by the transfer coordinator on the [facility] PAL line that there are no beds and wanted me to call back again to see if there are beds available. ? ?07/12/2021- I called [facility] PAL line at 8:02 AM on 07/12/2021.  I spoke to [place], Scientist, research (life sciences).  She said there were no beds and advised that I call back.  I called back at 3:55 PM and spoke to [place] again regarding bed availability.  She said they are still at capacity and do not have a bed for Mr. Gmerek. ? ?I called [facility] at 4:15 PM on 07/12/2021 to initiate transfer.  Unfortunately they have no beds. ? ?07/13/2021-  I spoke with [facility], transfer coordinator at [facility] at 2:28 pm. She said there are no beds available. ?I called [facility] at 2:59 PM today.  I spoke with Dr. Shawnie Dapper, hospitalist, at 6:05pm.  She said she is unable to accept the patient in transfer.  She said there is no way plastic surgeon will see the patient in the inpatient setting in his current condition.  They typically would see the patient in the office, optimize the patient for surgery including addressing conditions like malnutrition and make sure patient is medically adherent before scheduling surgery.  She recommended outpatient follow-up to the [facility] wound care center. ? ? ? ?Diet Order   ? ?       ?  Diet regular Room service appropriate? Yes; Fluid consistency: Thin  Diet effective now       ?  ? ?  ?  ? ?  ? ? ? ?Consultants: ?General surgeon ?Orthopedic surgeon ?Infectious disease ? ?Procedures: ?None ? ? ? ?Medications:  ? ? vitamin C  500 mg Oral BID  ? Chlorhexidine Gluconate Cloth  6 each Topical Daily  ? cyanocobalamin  1,000 mcg Intramuscular Daily  ? multivitamin-lutein  1 capsule Oral Daily  ? nutrition supplement (JUVEN)  1 packet Oral BID BM  ? pantoprazole  40 mg Oral Q1200  ? Ensure Max Protein  11 oz Oral BID  ? sodium chloride flush  10-40 mL Intracatheter Q12H   ? [START ON 07/15/2021] vitamin B-12  1,000 mcg Oral Daily  ? Vitamin D (Ergocalciferol)  50,000 Units Oral Q7 days  ? zinc sulfate  220 mg Oral Daily  ? ?Continuous Infusions: ? ampicillin-sulbactam (UNASYN) IV 3 g (07/13/21 1302)  ? iron sucrose 300 mg (07/13/21 1002)  ? ? ? ?Anti-infectives (From admission, onward)  ? ? Start     Dose/Rate Route Frequency Ordered Stop  ? 07/10/21 1800  vancomycin (VANCOREADY) IVPB 1250 mg/250 mL  Status:  Discontinued       ? 1,250 mg ?166.7 mL/hr over 90 Minutes Intravenous Every 24 hours 07/09/21 1747 07/10/21 1125  ? 07/10/21 1230  Ampicillin-Sulbactam (UNASYN) 3 g in sodium chloride 0.9 % 100 mL IVPB       ? 3 g ?200 mL/hr over 30 Minutes Intravenous Every 6 hours 07/10/21 1125    ? 07/09/21 1900  meropenem (MERREM) 1 g in sodium chloride 0.9 % 100 mL IVPB  Status:  Discontinued       ? 1 g ?200 mL/hr over 30 Minutes Intravenous  Every 8 hours 07/09/21 1747 07/10/21 1125  ? 07/09/21 1830  vancomycin (VANCOCIN) IVPB 1000 mg/200 mL premix       ? 1,000 mg ?200 mL/hr over 60 Minutes Intravenous  Once 07/09/21 1725 07/09/21 2214  ? ?  ? ? ? ? ? ? ? ? ? ?Family Communication/Anticipated D/C date and plan/Code Status  ? ?DVT prophylaxis: Place and maintain sequential compression device Start: 07/10/21 0828 ? ? ?  Code Status: Full Code ? ?Family Communication: None ?Disposition Plan: Plan to transfer to tertiary center for further management ? ? ?Status is: Inpatient ?Remains inpatient appropriate because: IV antibiotics ? ? ? ? ? ? ?Subjective:  ? ?Interval events noted.  He still has pain from sacral decubitus wounds. ? ?Objective:  ? ? ?Vitals:  ? 07/12/21 2036 07/13/21 0434 07/13/21 0742 07/13/21 1317  ?BP: 99/64 (!) 91/51 (!) 82/52 100/73  ?Pulse: 93 80 82 85  ?Resp: 16 18 18    ?Temp: 98.5 ?F (36.9 ?C) 98.8 ?F (37.1 ?C) 98.6 ?F (37 ?C)   ?TempSrc:      ?SpO2: 99% 99% 98%   ?Weight:      ?Height:      ? ?No data found. ? ? ?Intake/Output Summary (Last 24 hours) at 07/13/2021  1423 ?Last data filed at 07/13/2021 1024 ?Gross per 24 hour  ?Intake 1017.21 ml  ?Output 1850 ml  ?Net -832.79 ml  ? ?Filed Weights  ? 07/09/21 1646  ?Weight: 47.9 kg  ? ? ?Exam: ? ?GEN: NAD ?SKIN: Stage IV sacral de

## 2021-07-14 DIAGNOSIS — B952 Enterococcus as the cause of diseases classified elsewhere: Secondary | ICD-10-CM | POA: Diagnosis not present

## 2021-07-14 DIAGNOSIS — M86651 Other chronic osteomyelitis, right thigh: Secondary | ICD-10-CM | POA: Diagnosis not present

## 2021-07-14 LAB — CBC WITH DIFFERENTIAL/PLATELET
Abs Immature Granulocytes: 0.02 10*3/uL (ref 0.00–0.07)
Basophils Absolute: 0 10*3/uL (ref 0.0–0.1)
Basophils Relative: 1 %
Eosinophils Absolute: 0.1 10*3/uL (ref 0.0–0.5)
Eosinophils Relative: 2 %
HCT: 23 % — ABNORMAL LOW (ref 39.0–52.0)
Hemoglobin: 6.5 g/dL — ABNORMAL LOW (ref 13.0–17.0)
Immature Granulocytes: 0 %
Lymphocytes Relative: 20 %
Lymphs Abs: 1.1 10*3/uL (ref 0.7–4.0)
MCH: 22.3 pg — ABNORMAL LOW (ref 26.0–34.0)
MCHC: 28.3 g/dL — ABNORMAL LOW (ref 30.0–36.0)
MCV: 79 fL — ABNORMAL LOW (ref 80.0–100.0)
Monocytes Absolute: 0.3 10*3/uL (ref 0.1–1.0)
Monocytes Relative: 6 %
Neutro Abs: 3.9 10*3/uL (ref 1.7–7.7)
Neutrophils Relative %: 71 %
Platelets: 261 10*3/uL (ref 150–400)
RBC: 2.91 MIL/uL — ABNORMAL LOW (ref 4.22–5.81)
RDW: 17.8 % — ABNORMAL HIGH (ref 11.5–15.5)
WBC: 5.5 10*3/uL (ref 4.0–10.5)
nRBC: 0 % (ref 0.0–0.2)

## 2021-07-14 LAB — CULTURE, BLOOD (ROUTINE X 2)
Culture: NO GROWTH
Special Requests: ADEQUATE

## 2021-07-14 LAB — PREPARE RBC (CROSSMATCH)

## 2021-07-14 LAB — POTASSIUM: Potassium: 3.4 mmol/L — ABNORMAL LOW (ref 3.5–5.1)

## 2021-07-14 MED ORDER — POTASSIUM CHLORIDE 10 MEQ/100ML IV SOLN
10.0000 meq | INTRAVENOUS | Status: AC
  Administered 2021-07-14 (×3): 10 meq via INTRAVENOUS
  Filled 2021-07-14 (×3): qty 100

## 2021-07-14 MED ORDER — POTASSIUM CHLORIDE 10 MEQ/100ML IV SOLN
INTRAVENOUS | Status: AC
  Filled 2021-07-14: qty 100

## 2021-07-14 MED ORDER — POTASSIUM CHLORIDE 10 MEQ/100ML IV SOLN
INTRAVENOUS | Status: AC
  Administered 2021-07-14: 10 meq
  Filled 2021-07-14: qty 100

## 2021-07-14 MED ORDER — SODIUM CHLORIDE 0.9% IV SOLUTION: Freq: Once | INTRAVENOUS | Status: AC

## 2021-07-14 NOTE — Progress Notes (Signed)
Pt refused to have dressing changed at this time, "states I just want to sleep, first shift can do it" ?

## 2021-07-14 NOTE — Progress Notes (Signed)
? ? ? ?Progress Note  ? ? ?Jonathon York  [id number] DOB: 02-04-1976  DOA: 07/09/2021 ?PCP: Leonel Ramsay, MD  ? ? ? ? ?Brief Narrative:  ? ? ?Medical records reviewed and are as summarized below: ? ?Jonathon York is a 46 y.o. male with medical history significant for paraplegia, neurogenic bladder, s/p suprapubic catheter, chronic severe sacral decubitus ulcers, chronic anemia, malnutrition, anxiety, UTI, chronic right hip dislocation.  He was discharged from [facility] on 04/02/2021 after hospitalization for septic shock, strep anginosus bacteremia, stage IV sacral decubitus ulcers and osteomyelitis of bilateral hips with involvement of the sacrum and coccyx.  He underwent debridement of the sacral wound at that time.  Pseudomonas aeruginosa was isolated from the wound.  He was subsequently discharged on IV meropenem, to [facility] where he stayed for  about 3 months.  He said he had a right IJ central line placed on 07/01/2021 and he was discharged home on 07/03/2021 from the [facility] hospital.  However, he said that he had not been taking the IV antibiotics because he did not have any output or and also he did not have any electricity or water at home.   ? ?He had increasing pain in the hip and sacral wounds and noticed increasing drainage from the sacral wounds as well.  He was referred to the hospital for direct admission by Dr. Ola Spurr, his PCP and ID physician, for evaluation of  worsening sacral decubitus ulcer and bilateral hip osteomyelitis.  He has been following up with ID for his sacral ulcer but this has gotten worse. ? ? ?He was found to have Enterococcus bacteremia on this admission.  Dr. Windell Moment, general surgeon, was consulted but from his standpoint, there was no indication for debridement.  Dr. Sharlet Salina, orthopedic surgeon, was also consulted to assist with management.  He recommended referral to a tertiary center for further management because of the complexity of the  case. ? ? ? ? ? ?Assessment/Plan:  ? ?Principal Problem: ?  Enterococcus faecalis infection ?Active Problems: ?  Sacral decubitus ulcer, stage IV ([place]) ?  Paraplegia ([place]) ?  Neurogenic bladder ?  Protein-calorie malnutrition, severe ?  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[place]) ?  Microcytic anemia ?  GERD (gastroesophageal reflux disease) ?  Diarrhea ?  Chronic osteomyelitis involving pelvic region and thigh affecting right side ([place]) ?  Enterococcal bacteremia ? ? ?Nutrition Problem: Moderate Malnutrition ?Etiology: chronic illness (non healing sacral wound, paraplegia) ? ?Signs/Symptoms: moderate fat depletion, moderate muscle depletion, severe muscle depletion ? ? ?Body mass index is 15.15 kg/m?.  ? ? ?Enterococcus faecalis bacteremia, ?Left hip osteomyelitis, right hip osteomyelitis with chronic right hip dislocation, chronic bony destruction of the lower sacrum and coccyx, stage IV sacral decubitus ulcers ?Blood culture from 07/09/2021 was positive for Enterococcus faecalis.  No growth on repeat blood culture.  ID recommended IV Unasyn for 2 weeks. ?Orthopedic surgeon, Dr. Sharlet Salina, recommended transfer to a tertiary center for further evaluation.  ?Attempt to transfer patient to a tertiary center has been unsuccessful thus far. Dr. Illene Bolus, hospitalist, at [facility] recommended outpatient follow-up because of the nonemergent nature of required surgery.  ?  ?Plan will be to discharge patient to SNF or LTAC to complete IV antibiotics.  He will then follow-up with plastic surgeon and orthopedic surgeon at a tertiary center as an outpatient so that he can be optimized for potential surgical intervention. ? ? ?Leukopenia/neutropenia ?Improved ? ? ?Acute on chronic anemia, vitamin B12 deficiency ?Hemoglobin dropped  from 7.5-6.5.  Transfuse 1 unit of PRBCs.  S/p transfusion with 1 unit of PRBCs on 07/10/2021.  S/p 3 doses of IV iron sucrose.  Continue vitamin B12 injection. ? ? ?Hypokalemia ?Replete potassium intravenously.   He has been refusing oral potassium chloride. ? ?Paraplegia, neurogenic bladder ?Suprapubic catheter in place ? ? ?Severe protein caloric malnutrition, vitamin D deficiency ?Started high-dose vitamin D on 07/12/2021 ?Continue nutritional supplements.  Follow-up with dietitian ? ? ? ?07/11/2021- I spoke to Dr. Aretha Parrot, orthopedic surgeon, at [facility], on 07/11/2021 at 4:40 pm. He will be happy to see the patient in consultation when he gets to [facility].  However, he requested that I contact the hospitalist team so the patient will be at the admitted under the hospitalist service. I was told by the transfer coordinator on the [facility] PAL line that there are no beds and wanted me to call back again to see if there are beds available. ? ?07/12/2021- I called [place] PAL line at 8:02 AM on 07/12/2021.  I spoke to [place], Scientist, research (life sciences).  She said there were no beds and advised that I call back.  I called back at 3:55 PM and spoke to [facility] again regarding bed availability.  She said they are still at capacity and do not have a bed for [name]. ? ?I called [facility] at 4:15 PM on 07/12/2021 to initiate transfer.  Unfortunately they have no beds. ? ?07/13/2021-  I spoke with [facility], transfer coordinator at [facility] at 2:28 pm. She said there are no beds available. ?I called [facility] at 2:59 PM today.  I spoke with Dr. Shawnie Dapper, hospitalist, at 6:05pm.  She said she is unable to accept the patient in transfer.  She said there is no way plastic surgeon will see the patient in the inpatient setting in his current condition.  They typically would see the patient in the office, optimize the patient for surgery including addressing conditions like malnutrition and make sure patient is medically adherent before scheduling surgery.  She recommended outpatient follow-up to the [facility] wound care center. ? ? ? ?Diet Order   ? ?        ?  Diet regular Room service appropriate? Yes; Fluid consistency: Thin  Diet effective now       ?  ? ?  ?  ? ?  ? ? ? ?Consultants: ?General surgeon ?Orthopedic surgeon ?Infectious disease ? ?Procedures: ?None ? ? ? ?Medications:  ? ? sodium chloride   Intravenous Once  ? vitamin C  500 mg Oral BID  ? Chlorhexidine Gluconate Cloth  6 each Topical Daily  ? multivitamin-lutein  1 capsule Oral Daily  ? nutrition supplement (JUVEN)  1 packet Oral BID BM  ? pantoprazole  40 mg Oral Q1200  ? Ensure Max Protein  11 oz Oral BID  ? sodium chloride flush  10-40 mL Intracatheter Q12H  ? [START ON 07/15/2021] vitamin B-12  1,000 mcg Oral Daily  ? Vitamin D (Ergocalciferol)  50,000 Units Oral Q7 days  ? zinc sulfate  220 mg Oral Daily  ? ?Continuous Infusions: ? ampicillin-sulbactam (UNASYN) IV 3 g (07/14/21 [id number])  ? potassium chloride    ? potassium chloride    ? ? ? ?Anti-infectives (From admission, onward)  ? ? Start     Dose/Rate Route Frequency Ordered Stop  ? 07/10/21 1800  vancomycin (VANCOREADY) IVPB 1250 mg/250 mL  Status:  Discontinued       ? 1,250 mg ?166.7 mL/hr over 90 Minutes Intravenous Every 24 hours 07/09/21 1747 07/10/21 1125  ? 07/10/21 1230  Ampicillin-Sulbactam (UNASYN) 3 g in sodium chloride 0.9 % 100 mL IVPB       ? 3 g ?200 mL/hr over 30 Minutes Intravenous Every 6 hours 07/10/21 1125    ? 07/09/21 1900  meropenem (MERREM) 1 g in sodium chloride 0.9 % 100 mL IVPB  Status:  Discontinued       ? 1 g ?200 mL/hr over 30 Minutes Intravenous Every 8 hours 07/09/21 1747 07/10/21 1125  ? 07/09/21 1830  vancomycin (VANCOCIN) IVPB 1000 mg/200 mL premix       ? 1,000 mg ?200 mL/hr over 60 Minutes Intravenous  Once 07/09/21 1725 07/09/21 2214  ? ?  ? ? ? ? ? ? ? ? ? ?Family Communication/Anticipated D/C date and plan/Code Status  ? ?DVT prophylaxis: Place and maintain sequential compression device Start: 07/10/21 0828 ? ? ?  Code Status: Full Code ? ?Family Communication: None ?Disposition Plan: Plan to  transfer to tertiary center for further management ? ? ?Status is: Inpatient ?Remains inpatient appropriate because: IV antibiotics ? ? ? ? ? ? ?Subjective:  ? ?He does not provide much history.  He still has some p

## 2021-07-14 NOTE — Progress Notes (Signed)
Pt refused dressing changed at this time, he said his dressing was just changed around 5-6pm.  ?

## 2021-07-14 NOTE — TOC Progression Note (Signed)
Transition of Care (TOC) - Progression Note  ? ? ?Patient Details  ?Name: SHERIF MILLSPAUGH ?MRN: [id number] ?Date of Birth: 09/01/1975 ? ?Transition of Care (TOC) CM/SW Contact  ?Keaten Mashek E Julis Haubner, LCSW ?Phone Number: ?07/14/2021, 2:35 PM ? ?Clinical Narrative:    ?Per rounds, unable to find hospital transfer for patient and needs either SNF or LTAC for IV antibiotics. ?Spoke with patient who states he cannot go home because the [facility] agencies said he is unsafe to be home alone. ?Patient says he would be agreeable to SNF or LTAC. Prefers to go to [facility] either campus but prefers the one in [place]. ?Reached out to [place] with [facility], left VM. Need to determine if patient can go back to LTAC. If not, can look for SNF. ?Patient also requested CSW call his DSS Worker Santo Held at [phone]. CSW left VM for Judeth Cornfield, provided weekday RNCM number for follow up Monday.  ? ? ?  ?  ? ?Expected Discharge Plan and Services ?  ?  ?  ?  ?  ?                ?  ?  ?  ?  ?  ?  ?  ?  ?  ?  ? ? ?Social Determinants of Health (SDOH) Interventions ?  ? ?Readmission Risk Interventions ?No flowsheet data found. ? ?

## 2021-07-15 DIAGNOSIS — M24451 Recurrent dislocation, right hip: Secondary | ICD-10-CM

## 2021-07-15 DIAGNOSIS — E559 Vitamin D deficiency, unspecified: Secondary | ICD-10-CM

## 2021-07-15 DIAGNOSIS — D72819 Decreased white blood cell count, unspecified: Secondary | ICD-10-CM

## 2021-07-15 DIAGNOSIS — M8668 Other chronic osteomyelitis, other site: Secondary | ICD-10-CM

## 2021-07-15 DIAGNOSIS — B952 Enterococcus as the cause of diseases classified elsewhere: Secondary | ICD-10-CM | POA: Diagnosis not present

## 2021-07-15 DIAGNOSIS — E538 Deficiency of other specified B group vitamins: Secondary | ICD-10-CM

## 2021-07-15 DIAGNOSIS — E876 Hypokalemia: Secondary | ICD-10-CM

## 2021-07-15 DIAGNOSIS — M8588 Other specified disorders of bone density and structure, other site: Secondary | ICD-10-CM

## 2021-07-15 LAB — BPAM RBC
Blood Product Expiration Date: 202304192359
ISSUE DATE / TIME: 202303181406
Unit Type and Rh: 5100

## 2021-07-15 LAB — CBC WITH DIFFERENTIAL/PLATELET
Abs Immature Granulocytes: 0.03 10*3/uL (ref 0.00–0.07)
Basophils Absolute: 0 10*3/uL (ref 0.0–0.1)
Basophils Relative: 1 %
Eosinophils Absolute: 0.1 10*3/uL (ref 0.0–0.5)
Eosinophils Relative: 2 %
HCT: 29.6 % — ABNORMAL LOW (ref 39.0–52.0)
Hemoglobin: 8.8 g/dL — ABNORMAL LOW (ref 13.0–17.0)
Immature Granulocytes: 1 %
Lymphocytes Relative: 18 %
Lymphs Abs: 1.1 10*3/uL (ref 0.7–4.0)
MCH: 23 pg — ABNORMAL LOW (ref 26.0–34.0)
MCHC: 29.7 g/dL — ABNORMAL LOW (ref 30.0–36.0)
MCV: 77.3 fL — ABNORMAL LOW (ref 80.0–100.0)
Monocytes Absolute: 0.5 10*3/uL (ref 0.1–1.0)
Monocytes Relative: 8 %
Neutro Abs: 4.5 10*3/uL (ref 1.7–7.7)
Neutrophils Relative %: 70 %
Platelets: 299 10*3/uL (ref 150–400)
RBC: 3.83 MIL/uL — ABNORMAL LOW (ref 4.22–5.81)
RDW: 18.6 % — ABNORMAL HIGH (ref 11.5–15.5)
WBC: 6.4 10*3/uL (ref 4.0–10.5)
nRBC: 0 % (ref 0.0–0.2)

## 2021-07-15 LAB — TYPE AND SCREEN
ABO/RH(D): O POS
Antibody Screen: NEGATIVE
Unit division: 0

## 2021-07-15 LAB — BASIC METABOLIC PANEL
Anion gap: 5 (ref 5–15)
BUN: 6 mg/dL (ref 6–20)
CO2: 26 mmol/L (ref 22–32)
Calcium: 8 mg/dL — ABNORMAL LOW (ref 8.9–10.3)
Chloride: 108 mmol/L (ref 98–111)
Creatinine, Ser: 0.45 mg/dL — ABNORMAL LOW (ref 0.61–1.24)
GFR, Estimated: >60 mL/min (ref >60)
Glucose, Bld: 89 mg/dL (ref 70–99)
Potassium: 3.7 mmol/L (ref 3.5–5.1)
Sodium: 139 mmol/L (ref 135–145)

## 2021-07-15 LAB — VITAMIN C: Vitamin C: 0.2 mg/dL — ABNORMAL LOW (ref 0.4–2.0)

## 2021-07-15 LAB — MAGNESIUM: Magnesium: 2.2 mg/dL (ref 1.7–2.4)

## 2021-07-15 MED ORDER — SODIUM CHLORIDE 0.9 % IV SOLN
INTRAVENOUS | Status: DC | PRN
Start: 2021-07-15 — End: 2021-07-28

## 2021-07-15 NOTE — Progress Notes (Signed)
? ? ? ?Progress Note  ? ? ?Jonathon York  [id number] DOB: [birth year]  DOA: 07/09/2021 ?PCP: Leonel Ramsay, MD  ? ? ? ? ?Brief Narrative:  ? ? ?Medical records reviewed and are as summarized below: ? ?Jonathon York is a 46 y.o. male with medical history significant for paraplegia, neurogenic bladder, s/p suprapubic catheter, chronic severe sacral decubitus ulcers, chronic anemia, malnutrition, anxiety, UTI, chronic right hip dislocation.  He was discharged from [facility] on 04/02/2021 after hospitalization for septic shock, strep anginosus bacteremia, stage IV sacral decubitus ulcers and osteomyelitis of bilateral hips with involvement of the sacrum and coccyx.  He underwent debridement of the sacral wound at that time.  Pseudomonas aeruginosa was isolated from the wound.  He was subsequently discharged on IV meropenem, to [facility] where he stayed for  about 3 months.  He said he had a right IJ central line placed on 07/01/2021 and he was discharged home on 07/03/2021 from the [facility] hospital.  However, he said that he had not been taking the IV antibiotics because he did not have any output or and also he did not have any electricity or water at home.   ? ?He had increasing pain in the hip and sacral wounds and noticed increasing drainage from the sacral wounds as well.  He was referred to the hospital for direct admission by Dr. Ola Spurr, his PCP and ID physician, for evaluation of  worsening sacral decubitus ulcer and bilateral hip osteomyelitis.  He has been following up with ID for his sacral ulcer but this has gotten worse. ? ? ?He was found to have Enterococcus bacteremia on this admission.  Dr. Windell Moment, general surgeon, was consulted but from his standpoint, there was no indication for debridement.  Dr. Sharlet Salina, orthopedic surgeon, was also consulted to assist with management.  He recommended referral to a tertiary center for further management because of the complexity of the  case. ? ? ? ? ? ?Assessment/Plan:  ? ?Principal Problem: ?  Enterococcus faecalis infection ?Active Problems: ?  Sacral decubitus ulcer, stage IV ([place]) ?  Paraplegia ([place]) ?  Neurogenic bladder ?  Protein-calorie malnutrition, severe ?  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[place]) ?  Microcytic anemia ?  GERD (gastroesophageal reflux disease) ?  Diarrhea ?  Chronic osteomyelitis involving pelvic region and thigh affecting right side ([place]) ?  Enterococcal bacteremia ? ? ?Nutrition Problem: Moderate Malnutrition ?Etiology: chronic illness (non healing sacral wound, paraplegia) ? ?Signs/Symptoms: moderate fat depletion, moderate muscle depletion, severe muscle depletion ? ? ?Body mass index is 15.15 kg/m?.  ? ? ?Enterococcus faecalis bacteremia, ?Left hip osteomyelitis, right hip osteomyelitis with chronic right hip dislocation, chronic bony destruction of the lower sacrum and coccyx, stage IV sacral decubitus ulcers ?Blood culture from 07/09/2021 was positive for Enterococcus faecalis.  No growth on repeat blood culture.  ID recommended IV Unasyn for 2 weeks. ?Orthopedic surgeon, Dr. Sharlet Salina, recommended transfer to a tertiary center for further evaluation.  ?Attempt to transfer patient to a tertiary center has been unsuccessful thus far. Dr. Illene Bolus, hospitalist, at [facility] recommended outpatient follow-up because of the nonemergent nature of required surgery.  ?  ?He will be discharged to SNF for LTAC for IV antibiotics with plan to follow-up with plastic surgeon and orthopedic surgeon at a tertiary  center as an outpatient ? ?Awaiting placement to SNF or LTAC.  Follow-up with social worker to assist with disposition. ? ? ?Leukopenia/neutropenia ?Improved ? ? ?Acute on chronic anemia, vitamin B12  deficiency ?H&H improved.  S/p transfusion with 1 unit of PRBCs on 07/14/2021.  S/p transfusion with 1 unit of PRBCs on 07/10/2021.  S/p 3 doses of IV iron sucrose.  Continue vitamin B12 injection. ? ? ?Hypokalemia ?Improved.   Continue to monitor electrolytes ? ?Paraplegia, neurogenic bladder ?Suprapubic catheter in place ? ? ?Severe protein caloric malnutrition, vitamin D deficiency ?Started high-dose vitamin D on 07/12/2021 ?Continue nutritional supplements.  Follow-up with dietitian ? ? ? ?07/11/2021- I spoke to Dr. Aretha Parrot, orthopedic surgeon, at [facility], on 07/11/2021 at 4:40 pm. He will be happy to see the patient in consultation when he gets to [facility].  However, he requested that I contact the hospitalist team so the patient will be at the admitted under the hospitalist service. I was told by the transfer coordinator on the [facility] PAL line that there are no beds and wanted me to call back again to see if there are beds available. ? ?07/12/2021- I called [place] PAL line at 8:02 AM on 07/12/2021.  I spoke to [place], Scientist, research (life sciences).  She said there were no beds and advised that I call back.  I called back at 3:55 PM and spoke to [place] again regarding bed availability.  She said they are still at capacity and do not have a bed for Mr. Perpich. ? ?I called [facility] at 4:15 PM on 07/12/2021 to initiate transfer.  Unfortunately they have no beds. ? ?07/13/2021-  I spoke with [facility], transfer coordinator at [facility] at 2:28 pm. She said there are no beds available. ?I called [facility] at 2:59 PM today.  I spoke with Dr. Shawnie Dapper, hospitalist, at 6:05pm.  She said she is unable to accept the patient in transfer.  She said there is no way plastic surgeon will see the patient in the inpatient setting in his current condition.  They typically would see the patient in the office, optimize the patient for surgery including addressing conditions like malnutrition and make sure patient is medically adherent before scheduling surgery.  She recommended outpatient follow-up to the [facility] wound care center. ? ? ? ?Diet Order   ? ?       ?  Diet  regular Room service appropriate? Yes; Fluid consistency: Thin  Diet effective now       ?  ? ?  ?  ? ?  ? ? ? ?Consultants: ?General surgeon ?Orthopedic surgeon ?Infectious disease ? ?Procedures: ?None ? ? ? ?Medications:  ? ? vitamin C  500 mg Oral BID  ? Chlorhexidine Gluconate Cloth  6 each Topical Daily  ? multivitamin-lutein  1 capsule Oral Daily  ? nutrition supplement (JUVEN)  1 packet Oral BID BM  ? pantoprazole  40 mg Oral Q1200  ? Ensure Max Protein  11 oz Oral BID  ? sodium chloride flush  10-40 mL Intracatheter Q12H  ? vitamin B-12  1,000 mcg Oral Daily  ? Vitamin D (Ergocalciferol)  50,000 Units Oral Q7 days  ? zinc sulfate  220 mg Oral Daily  ? ?Continuous Infusions: ? ampicillin-sulbactam (UNASYN) IV 3 g (07/15/21 1113)  ? ? ? ?Anti-infectives (From admission, onward)  ? ? Start     Dose/Rate Route Frequency Ordered Stop  ? 07/10/21 1800  vancomycin (VANCOREADY) IVPB 1250 mg/250 mL  Status:  Discontinued       ? 1,250 mg ?166.7 mL/hr over 90 Minutes Intravenous Every 24 hours 07/09/21  1747 07/10/21 1125  ? 07/10/21 1230  Ampicillin-Sulbactam (UNASYN) 3 g in sodium chloride 0.9 % 100 mL IVPB       ? 3 g ?200 mL/hr over 30 Minutes Intravenous Every 6 hours 07/10/21 1125    ? 07/09/21 1900  meropenem (MERREM) 1 g in sodium chloride 0.9 % 100 mL IVPB  Status:  Discontinued       ? 1 g ?200 mL/hr over 30 Minutes Intravenous Every 8 hours 07/09/21 1747 07/10/21 1125  ? 07/09/21 1830  vancomycin (VANCOCIN) IVPB 1000 mg/200 mL premix       ? 1,000 mg ?200 mL/hr over 60 Minutes Intravenous  Once 07/09/21 1725 07/09/21 2214  ? ?  ? ? ? ? ? ? ? ? ? ?Family Communication/Anticipated D/C date and plan/Code Status  ? ?DVT prophylaxis: Place and maintain sequential compression device Start: 07/10/21 0828 ? ? ?  Code Status: Full Code ? ?Family Communication: None ?Disposition Plan: Plan to transfer to tertiary center for further management ? ? ?Status is: Inpatient ?Remains inpatient appropriate because: IV  antibiotics ? ? ? ? ? ? ?Subjective:  ? ?Interval events noted.  No new complaints.  He still has pain from the sacral wounds.  He does not provide much history. ? ?Objective:  ? ? ?Vitals:  ? 07/14/21 1942 07/14/21 2016 0

## 2021-07-15 NOTE — [place] FL2 (Signed)
?[place] MEDICAID FL2 LEVEL OF CARE SCREENING TOOL  ?  ? ?IDENTIFICATION  ?Patient Name: ?Jonathon York Birthdate: 07-19-1975 Sex: male Admission Date (Current Location): ?07/09/2021  ?[place] and [place] Number: ? [place] ?  Facility and Address:  ?[facility], [address], [place], [place] [address] ?     Provider Number: ?[id number]  ?Attending Physician Name and Address:  ?Jennye Boroughs, MD ? Relative Name and Phone Number:  ?Glenda, Mcloone (Daughter)   [phone] [facility]) ?   ?Current Level of Care: ?Hospital Recommended Level of Care: ?[place] Prior Approval Number: ?  ? ?Date Approved/Denied: ?  PASRR Number: ?[id number] ? ?Discharge Plan: ?  ?  ? ?Current Diagnoses: ?Patient Active Problem List  ? Diagnosis Date Noted  ? Chronic osteomyelitis involving pelvic region and thigh affecting right side ([place])   ? Enterococcal bacteremia   ? Enterococcus faecalis infection 07/12/2021  ? Microcytic anemia 07/09/2021  ? GERD (gastroesophageal reflux disease) 07/09/2021  ? Diarrhea 07/09/2021  ? Acute on chronic anemia   ? AKI (acute kidney injury) ([place])   ? Urinary retention 02/10/2021  ? Nausea and vomiting 08/29/2020  ? 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 Generalized abdominal pain   ? UTI (urinary tract infection) 12/02/2019  ? UTI (urinary tract infection) due to urinary indwelling catheter ([place]) 12/01/2019  ? Infected decubitus ulcer 10/27/2019  ? 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[place]) 10/27/2019  ? Bilateral leg edema 10/27/2019  ? 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 Constipation   ? Iron deficiency anemia   ? Sacral decubitus ulcer, stage IV ([place]) 07/13/2019  ? Intractable pain 11/26/2018  ? Anxiety 04/22/2018  ? Acute osteomyelitis of right foot ([place]) 03/18/2018  ? Pressure injury of skin 03/18/2018  ? Stage IV pressure ulcer of right buttock ([place])   ? Protein-calorie malnutrition, severe 11/20/2016  ? Herpes simplex infection of penis   ? Decubitus ulcer 11/19/2016  ?  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 Decubitus ulcer of sacral region   ? Sepsis ([place]) 04/15/2016  ? 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 Pressure ulcer stage III 04/30/2015  ? Sterile pyuria 04/30/2015  ? Paraplegia ([place]) 04/30/2015  ? Neurogenic bladder 04/30/2015  ? Toe osteomyelitis, right ([place]) 04/28/2015  ? Moderate malnutrition ([place]) 04/28/2015  ? ? ?Orientation RESPIRATION BLADDER Height & Weight   ?  ?Self, Time, Situation, Place ? Normal Indwelling catheter Weight: 105 lb 9.6 oz (47.9 kg) ?Height:  5\' 10"  (177.8 cm)  ?BEHAVIORAL SYMPTOMS/MOOD NEUROLOGICAL BOWEL NUTRITION STATUS  ?    Continent Diet (regular diet)  ?AMBULATORY STATUS COMMUNICATION OF NEEDS Skin   ?Limited Assist Verbally Other (Comment) (stage 3 sacrum, pressure injury buttocks, ischial tuberosity stage 4) ?  ?  ?  ?    ?     ?     ? ? ?Personal Care Assistance Level of Assistance  ?Bathing, Dressing, Feeding Bathing Assistance: Limited assistance ?Feeding assistance: Independent ?Dressing Assistance: Limited assistance ?   ? ?Functional Limitations Info  ?    ?  ?   ? ? ?SPECIAL CARE FACTORS FREQUENCY  ?    ?  ?  ?  ?  ?  ?  ?   ? ? ?Contractures    ? ? ?Additional Factors Info  ?Code Status, Allergies Code Status Info: full ?Allergies Info: orange fruit, sulfa antibiotics ?  ?  ?  ?   ? ?Current Medications (07/15/2021):  This is the current  hospital active medication list ?Current Facility-Administered Medications  ?Medication Dose Route Frequency Provider Last Rate Last Admin  ? acetaminophen (TYLENOL) tablet 650 mg  650 mg Oral Q6H PRN Ivor Costa, MD   650 mg at 07/09/21 2009  ? Ampicillin-Sulbactam (UNASYN) 3 g in sodium chloride 0.9 % 100 mL IVPB  3 g Intravenous Q6H Tommy Medal, Lavell Islam, MD 200 mL/hr at 07/15/21 [id number] Infusion Verify at 07/15/21 [id number]  ? ascorbic acid (VITAMIN C) tablet 500 mg  500 mg Oral BID Jennye Boroughs, MD   500 mg at 07/14/21 2058  ?  Chlorhexidine Gluconate Cloth 2 % PADS 6 each  6 each Topical Daily Ivor Costa, MD   6 each at 07/14/21 [phone]  ? diphenhydrAMINE (BENADRYL) injection 12.5 mg  12.5 mg Intravenous Once PRN Foust, Katy L, NP      ? loperamide (IMODIUM) capsule 2 mg  2 mg Oral PRN Ivor Costa, MD   2 mg at 07/09/21 2016  ? morphine (PF) 2 MG/ML injection 2 mg  2 mg Intravenous Q3H PRN Ralene Muskrat B, MD   2 mg at 07/14/21 2131  ? multivitamin-lutein (OCUVITE-LUTEIN) capsule 1 capsule  1 capsule Oral Daily Ivor Costa, MD   1 capsule at 07/14/21 0944  ? nutrition supplement (JUVEN) (JUVEN) powder packet 1 packet  1 packet Oral BID BM Sidney Ace, MD   1 packet at 07/11/21 1348  ? ondansetron (ZOFRAN) injection 4 mg  4 mg Intravenous Q8H PRN Ivor Costa, MD      ? oxyCODONE-acetaminophen (PERCOCET/ROXICET) 5-325 MG per tablet 1 tablet  1 tablet Oral Q4H PRN Ivor Costa, MD   1 tablet at 07/14/21 1716  ? pantoprazole (PROTONIX) EC tablet 40 mg  40 mg Oral Q1200 Ivor Costa, MD   40 mg at 07/14/21 1251  ? protein supplement (ENSURE MAX) liquid  11 oz Oral BID Ralene Muskrat B, MD   11 oz at 07/13/21 0951  ? sodium chloride flush (NS) 0.9 % injection 10-40 mL  10-40 mL Intracatheter Q12H Ivor Costa, MD   10 mL at 07/14/21 2248  ? sodium chloride flush (NS) 0.9 % injection 10-40 mL  10-40 mL Intracatheter PRN Ivor Costa, MD      ? traZODone (DESYREL) tablet 50 mg  50 mg Oral QHS PRN Ivor Costa, MD      ? vitamin B-12 (CYANOCOBALAMIN) tablet 1,000 mcg  1,000 mcg Oral Daily Jennye Boroughs, MD      ? Vitamin D (Ergocalciferol) (DRISDOL) capsule 50,000 Units  50,000 Units Oral Q7 days Jennye Boroughs, MD   50,000 Units at 07/12/21 1100  ? zinc sulfate capsule 220 mg  220 mg Oral Daily Jennye Boroughs, MD   220 mg at 07/14/21 0944  ? ? ? ?Discharge Medications: ?Please see discharge summary for a list of discharge medications. ? ?Relevant Imaging Results: ? ?Relevant Lab Results: ? ? ?Additional Information ?SS #: [id number]; needing  placement for IV abx ? ?Trinna Kunst E Alferd Obryant, LCSW ? ? ? ? ?

## 2021-07-15 NOTE — TOC Progression Note (Signed)
Transition of Care (TOC) - Progression Note  ? ? ?Patient Details  ?Name: Jonathon York ?MRN: [id number] ?Date of Birth: 07-22-1975 ? ?Transition of Care (TOC) CM/SW Contact  ?Yalonda Sample E Oneta Sigman, LCSW ?Phone Number: ?07/15/2021, 10:34 AM ? ?Clinical Narrative:   Started SNF as back up option if LTAC can't take patient. ? ? ? ?  ?  ? ?Expected Discharge Plan and Services ?  ?  ?  ?  ?  ?                ?  ?  ?  ?  ?  ?  ?  ?  ?  ?  ? ? ?Social Determinants of Health (SDOH) Interventions ?  ? ?Readmission Risk Interventions ?No flowsheet data found. ? ?

## 2021-07-15 NOTE — Plan of Care (Signed)
?  Problem: Clinical Measurements: ?Goal: Ability to maintain clinical measurements within normal limits will improve ?Outcome: Progressing ?Goal: Will remain free from infection ?Outcome: Progressing ?Goal: Diagnostic test results will improve ?Outcome: Progressing ?Goal: Respiratory complications will improve ?Outcome: Progressing ?Goal: Cardiovascular complication will be avoided ?Outcome: Progressing ?  ?Problem: Pain Managment: ?Goal: General experience of comfort will improve ?Outcome: Progressing ?  ?Pt is alert and oriented. Bp is 87/55 (map-65), HR-93. Oncall provider informed with no orders. Suprapubic cath in place; draining good amount of urine. Given morphine IV last night.  ?

## 2021-07-15 NOTE — Progress Notes (Signed)
Patient request not to have his CBG checked in the a.m. as ordered. Order received from Dr Myriam Forehand to discontinue it ?

## 2021-07-16 DIAGNOSIS — B952 Enterococcus as the cause of diseases classified elsewhere: Secondary | ICD-10-CM | POA: Diagnosis not present

## 2021-07-16 DIAGNOSIS — M86651 Other chronic osteomyelitis, right thigh: Secondary | ICD-10-CM | POA: Diagnosis not present

## 2021-07-16 LAB — CULTURE, BLOOD (ROUTINE X 2)
Culture: NO GROWTH
Culture: NO GROWTH
Special Requests: ADEQUATE
Special Requests: ADEQUATE

## 2021-07-16 LAB — BASIC METABOLIC PANEL
Anion gap: 5 (ref 5–15)
BUN: 10 mg/dL (ref 6–20)
CO2: 26 mmol/L (ref 22–32)
Calcium: 8.1 mg/dL — ABNORMAL LOW (ref 8.9–10.3)
Chloride: 108 mmol/L (ref 98–111)
Creatinine, Ser: 0.48 mg/dL — ABNORMAL LOW (ref 0.61–1.24)
GFR, Estimated: >60 mL/min (ref >60)
Glucose, Bld: 94 mg/dL (ref 70–99)
Potassium: 3.9 mmol/L (ref 3.5–5.1)
Sodium: 139 mmol/L (ref 135–145)

## 2021-07-16 LAB — CBC WITH DIFFERENTIAL/PLATELET
Abs Immature Granulocytes: 0.02 10*3/uL (ref 0.00–0.07)
Basophils Absolute: 0 10*3/uL (ref 0.0–0.1)
Basophils Relative: 1 %
Eosinophils Absolute: 0.1 10*3/uL (ref 0.0–0.5)
Eosinophils Relative: 3 %
HCT: 31.4 % — ABNORMAL LOW (ref 39.0–52.0)
Hemoglobin: 9.1 g/dL — ABNORMAL LOW (ref 13.0–17.0)
Immature Granulocytes: 0 %
Lymphocytes Relative: 29 %
Lymphs Abs: 1.5 10*3/uL (ref 0.7–4.0)
MCH: 23.2 pg — ABNORMAL LOW (ref 26.0–34.0)
MCHC: 29 g/dL — ABNORMAL LOW (ref 30.0–36.0)
MCV: 79.9 fL — ABNORMAL LOW (ref 80.0–100.0)
Monocytes Absolute: 0.5 10*3/uL (ref 0.1–1.0)
Monocytes Relative: 9 %
Neutro Abs: 3 10*3/uL (ref 1.7–7.7)
Neutrophils Relative %: 58 %
Platelets: 309 10*3/uL (ref 150–400)
RBC: 3.93 MIL/uL — ABNORMAL LOW (ref 4.22–5.81)
RDW: 19.2 % — ABNORMAL HIGH (ref 11.5–15.5)
WBC: 5.1 10*3/uL (ref 4.0–10.5)
nRBC: 0 % (ref 0.0–0.2)

## 2021-07-16 MED ORDER — HYDROCORTISONE 0.5 % EX CREA
TOPICAL_CREAM | Freq: Two times a day (BID) | CUTANEOUS | Status: AC
  Filled 2021-07-16: qty 28.35

## 2021-07-16 NOTE — Care Management Important Message (Signed)
Important Message ? ?Patient Details  ?Name: Jonathon York ?MRN: [id number] ?Date of Birth: 1976-03-08 ? ? ?Medicare Important Message Given:  Yes ? ? ? ? ?[name] ?07/16/2021, 11:02 AM ?

## 2021-07-16 NOTE — Progress Notes (Signed)
?  [place] for Infectious Disease   ? ?Date of Admission:  07/09/2021   Total days of antibiotics 8 ?       Day 7 amp/sub ? ? ?ID: Jonathon York is a 46 y.o. male with  paraplegic with pelvic osteo previously on meropenem since mid October where he was transferred to [facility] and recently discharged home on 3/7 on meropenem, but unclear how long he was supposed to take it. He was subsequently admitted from his PCP office due to fever, chills, and worsening drain output. On admit, he was found to have enterococcal bacteremia and concern for indwelling central line. The main source is likely his stage 4 wounds that required surgical debridement/girdlestone procedure, general surgery recommending him to follow up at tertiary care center.  ? ?Principal Problem: ?  Enterococcus faecalis infection ?Active Problems: ?  Paraplegia ([place]) ?  Neurogenic bladder ?  Protein-calorie malnutrition, severe ?  Sacral decubitus ulcer, stage IV ([place]) ?  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[place]) ?  Microcytic anemia ?  GERD (gastroesophageal reflux disease) ?  Diarrhea ?  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  Enterococcal bacteremia ? ? ? ?Subjective: ?Afebrile. He states that his appetite is improving. ? ?RN noticed increasing redness about insertion site of central line(placed from [facility] on 3/5) ? ?Medications:  ? vitamin C  500 mg Oral BID  ? Chlorhexidine Gluconate Cloth  6 each Topical Daily  ? multivitamin-lutein  1 capsule Oral Daily  ? nutrition supplement (JUVEN)  1 packet Oral BID BM  ? pantoprazole  40 mg Oral Q1200  ? Ensure Max Protein  11 oz Oral BID  ? sodium chloride flush  10-40 mL Intracatheter Q12H  ? vitamin B-12  1,000 mcg Oral Daily  ? Vitamin D (Ergocalciferol)  50,000 Units Oral Q7 days  ? zinc sulfate  220 mg Oral Daily  ? ? ?Objective: ?Vital signs in last 24 hours: ?Temp:  [98.2 ?F (36.8 ?C)-98.7 ?F (37.1 ?C)] 98.7 ?F (37.1 ?C) (03/20 7614) ?Pulse Rate:  [88-96]  88 (03/20 0906) ?Resp:  [18] 18 (03/20 0220) ?BP: (94-104)/(60-71) 96/69 (03/20 7092) ?SpO2:  [96 %-99 %] 98 % (03/20 0906) ?Physical Exam  ?Constitutional: He is oriented to person, place, and time. He appears well-developed and mal-nourished. No distress.  ?HENT:  ?Mouth/Throat: Oropharynx is clear and moist. No oropharyngeal exudate.  ?Cardiovascular: Normal rate, regular rhythm and normal heart sounds. Exam reveals no gallop and no friction rub.  ?No murmur heard.  ?Chest wall: slight erythema at insertion of central line. Denies tenderness. ?Pulmonary/Chest: Effort normal and breath sounds normal. No respiratory distress. He has no wheezes.  ?Abdominal: Soft. Bowel sounds are normal. He exhibits no distension. There is no tenderness.  ?Lymphadenopathy:  ?He has no cervical adenopathy.  ?Skin: Skin is warm and dry. No rash noted. No erythema.  ?Psychiatric: He has a normal mood and affect. His behavior is normal.  ? ?Lab Results ?Recent Labs  ?  07/15/21 ?9574 07/16/21 ?0340  ?WBC 6.4 5.1  ?HGB 8.8* 9.1*  ?HCT 29.6* 31.4*  ?NA 139 139  ?K 3.7 3.9  ?CL 108 108  ?CO2 26 26  ?BUN 6 10  ?CREATININE 0.45* 0.48*  ? ?Lab Results  ?Component Value Date  ? ESRSEDRATE 107 (H) 07/09/2021  ? ? ?Microbiology: ?3/15 blood cx NGTD ?3/13 blood cx enterococcus (amp S) ?Studies/Results: ?No results found. ? ? ?Assessment/Plan: ?Enterococcal bacteremia = will recommend for line removal to  get line holiday. Recommend to get TEE to help decide length of therapy ? ?Chronic pelvic osteo = previously received meropenem from mid oct through 3/7. ESR still elevated. Recommend to transition to orals with amox/clav at discharge. Would need follow up at complex wound clinic that partners, ortho-plastics. Follow wound care recs ? ?Limited venous access = see if vascular access team can place piv by U/S. ? ?Severe protein calorie malnutrition = continue with protein supplementation. ? ?Carlyle Basques ?[place] for Infectious  Diseases ?Pager: [phone] ? ?07/16/2021, 3:58 PM ? ? ? ? ? ?

## 2021-07-16 NOTE — Progress Notes (Signed)
I let pt know that it was requested to look in his arms for USPIV. Pt. Stated " the veins in my arms are shot that's why I have this" while pointing to his R CVC. He stated that a doctor came in and said "your line is fine". He has had 9 PICCs and numerous PIVs. His preference is to not get another PIV. He is requesting clarification on the best way forward and states that he does not understand why he is hearing different accounts on whether his central access will stay or be pulled. Notified RN. She stated she will seek out better clarification. ?

## 2021-07-16 NOTE — Progress Notes (Signed)
? ? ? ?Progress Note  ? ? ?Jonathon York  Jonathon York DOB: 08-23-1975  DOA: 07/09/2021 ?PCP: Leonel Ramsay, MD  ? ? ? ? ?Brief Narrative:  ? ? ?Medical records reviewed and are as summarized below: ? ?Jonathon York is a 46 y.o. male with medical history significant for paraplegia, neurogenic bladder, s/p suprapubic catheter, chronic severe sacral decubitus ulcers, chronic anemia, malnutrition, anxiety, UTI, chronic right hip dislocation.  He was discharged from [facility] on 04/[facility]/2021 after hospitalization for septic shock, strep anginosus bacteremia, stage IV sacral decubitus ulcers and osteomyelitis of bilateral hips with involvement of the sacrum and coccyx.  He underwent debridement of the sacral wound at that time.  Pseudomonas aeruginosa was isolated from the wound.  He was subsequently discharged on IV meropenem, to [facility] where he stayed for  about 3 months.  He said he had a right IJ central line placed on 07/01/2021 and he was discharged home on 07/03/2021 from the [facility] hospital.  However, he said that he had not been taking the IV antibiotics because he did not have any output or and also he did not have any electricity or water at home.   ? ?He had increasing pain in the hip and sacral wounds and noticed increasing drainage from the sacral wounds as well.  He was referred to the hospital for direct admission by Dr. Ola Spurr, his PCP and ID physician, for evaluation of  worsening sacral decubitus ulcer and bilateral hip osteomyelitis.  He has been following up with ID for his sacral ulcer but this has gotten worse. ? ? ?He was found to have Enterococcus bacteremia on this admission.  Dr. Windell Moment, general surgeon, was consulted but from his standpoint, there was no indication for debridement.  Dr. Sharlet Salina, orthopedic surgeon, was also consulted to assist with management.  He recommended referral to a tertiary center for further management because of the complexity of the  case. ? ? ? ? ? ?Assessment/Plan:  ? ?Principal Problem: ?  Enterococcus faecalis infection ?Active Problems: ?  Sacral decubitus ulcer, stage IV ([place]) ?  Paraplegia ([place]) ?  Neurogenic bladder ?  Protein-calorie malnutrition, severe ?  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[place]) ?  Microcytic anemia ?  GERD (gastroesophageal reflux disease) ?  Diarrhea ?  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  Enterococcal bacteremia ? ? ?Nutrition Problem: Moderate Malnutrition ?Etiology: chronic illness (non healing sacral wound, paraplegia) ? ?Signs/Symptoms: moderate fat depletion, moderate muscle depletion, severe muscle depletion ? ? ?Body mass index is 15.15 kg/m?.  ? ? ?Enterococcus faecalis bacteremia, ?Left hip osteomyelitis, right hip osteomyelitis with chronic right hip dislocation, chronic bony destruction of the lower sacrum and coccyx, stage IV sacral decubitus ulcers ?Blood culture from 07/09/2021 was positive for Enterococcus faecalis.  No growth on repeat blood culture.   ?Case discussed with Dr. Baxter Flattery. She recommended TEE and plan to transition IV Unasyn to Augmentin at discharge if TEE is negative. Dr. Clayborn Bigness, cardiologist, has been consulted for TEE. ?There is mild redness around the insertion site of right IJ central line but no active drainage noted.  Consulted IV team to establish peripheral IV access. ? ?Dr. Sharlet Salina, orthopedic surgeon, saw the patient on 07/11/2021 and recommended transfer to tertiary center for management of complex bilateral chronic hip osteomyelitis, chronic right hip dislocation and stage IV sacral decubitus ulcers.  Patient will follow-up with the [facility] wound care center to establish care with a plastic surgeon and orthopedic  Psychologist, sport and exercise. ? ? ?Leukopenia/neutropenia ?Improved ? ? ?Acute on chronic anemia, vitamin B12 deficiency ?H&H improved.  S/p transfusion with 1 unit of PRBCs on 07/14/2021.  S/p transfusion with 1 unit of PRBCs on 07/10/2021.  S/p 3  doses of IV iron sucrose.  Continue vitamin B12 injection. ? ? ?Hypokalemia ?Improved.  Continue to monitor electrolytes ? ?Paraplegia, neurogenic bladder ?Suprapubic catheter in place ? ? ?Severe protein caloric malnutrition, vitamin D deficiency ?Started high-dose vitamin D on 07/12/2021 ?Continue nutritional supplements.  Follow-up with dietitian ? ? ? ?07/11/2021- I spoke to Dr. Aretha Parrot, orthopedic surgeon, at [facility], on 07/11/2021 at 4:40 pm. He will be happy to see the patient in consultation when he gets to [facility].  However, he requested that I contact the hospitalist team so the patient will be at the admitted under the hospitalist service. I was told by the transfer coordinator on the [facility] PAL line that there are no beds and wanted me to call back again to see if there are beds available. ? ?07/12/2021- I called [place] PAL line at 8:02 AM on 07/12/2021.  I spoke to [place], Scientist, research (life sciences).  She said there were no beds and advised that I call back.  I called back at 3:55 PM and spoke to [place] again regarding bed availability.  She said they are still at capacity and do not have a bed for Mr. Meyerhofer. ? ?I called [facility] at 4:15 PM on 07/12/2021 to initiate transfer.  Unfortunately they have no beds. ? ?07/13/2021-  I spoke with [facility], transfer coordinator at [facility] at 2:28 pm. She said there are no beds available. ?I called [facility] at 2:59 PM today.  I spoke with Dr. Shawnie Dapper, hospitalist, at 6:05pm.  She said she is unable to accept the patient in transfer.  She said there is no way plastic surgeon will see the patient in the inpatient setting in his current condition.  They typically would see the patient in the office, optimize the patient for surgery including addressing conditions like malnutrition and make sure patient is medically adherent before scheduling surgery.  She recommended  outpatient follow-up to the [facility] wound care center. ? ? ? ?Diet Order   ? ?       ?  Diet regular Room service appropriate? Yes; Fluid consistency: Thin  Diet effective now       ?  ? ?  ?  ? ?  ? ? ? ?Consultants: ?General surgeon ?Orthopedic surgeon ?Infectious disease ? ?Procedures: ?None ? ? ? ?Medications:  ? ? vitamin C  500 mg Oral BID  ? Chlorhexidine Gluconate Cloth  6 each Topical Daily  ? multivitamin-lutein  1 capsule Oral Daily  ? nutrition supplement (JUVEN)  1 packet Oral BID BM  ? pantoprazole  40 mg Oral Q1200  ? Ensure Max Protein  11 oz Oral BID  ? sodium chloride flush  10-40 mL Intracatheter Q12H  ? vitamin B-12  1,000 mcg Oral Daily  ? Vitamin D (Ergocalciferol)  50,000 Units Oral Q7 days  ? zinc sulfate  220 mg Oral Daily  ? ?Continuous Infusions: ? sodium chloride 10 mL/hr at 07/16/21 0502  ? ampicillin-sulbactam (UNASYN) IV 3 g (07/16/21 1140)  ? ? ? ?Anti-infectives (From admission, onward)  ? ? Start     Dose/Rate Route Frequency Ordered Stop  ? 07/10/21 1800  vancomycin (VANCOREADY) IVPB 1250 mg/250 mL  Status:  Discontinued       ? 1,250 mg ?166.7 mL/hr over 90 Minutes Intravenous Every 24 hours 07/09/21 1747 07/10/21 1125  ? 07/10/21 1230  Ampicillin-Sulbactam (UNASYN) 3 g in sodium chloride 0.9 % 100 mL IVPB       ? 3 g ?200 mL/hr over 30 Minutes Intravenous Every 6 hours 07/10/21 1125    ? 07/09/21 1900  meropenem (MERREM) 1 g in sodium chloride 0.9 % 100 mL IVPB  Status:  Discontinued       ? 1 g ?200 mL/hr over 30 Minutes Intravenous Every 8 hours 07/09/21 1747 07/10/21 1125  ? 07/09/21 1830  vancomycin (VANCOCIN) IVPB 1000 mg/200 mL premix       ? 1,000 mg ?200 mL/hr over 60 Minutes Intravenous  Once 07/09/21 1725 07/09/21 2214  ? ?  ? ? ? ? ? ? ? ? ? ?Family Communication/Anticipated D/C date and plan/Code Status  ? ?DVT prophylaxis: Place and maintain sequential compression device Start: 07/10/21 0828 ? ? ?  Code Status: Full Code ? ?Family Communication: None ?Disposition Plan:  Plan to discharge to SNF ? ? ?Status is: Inpatient ?Remains inpatient appropriate because: IV antibiotics ? ? ? ? ? ? ?Subjective:  ? ?Interval events noted.  He complains of itchy rash on the face.  ? ?Objectiv

## 2021-07-16 NOTE — Progress Notes (Signed)
Central line dressing change performed. Redness and drainage noted at the insertion site. MD made aware of these findings. ?

## 2021-07-16 NOTE — Progress Notes (Signed)
Nurse entered patients room to administer meds and perform head to toe assessment. Patient's light was off, nurse told patient that she would turn light on so that she could see. Nurse turned on light and told patient "I was told that you do not like to be bothered, so I want to check and see what time you would like your dressing change done as it is scheduled on my shift?" Patient stated "I want it done on first shift". Nurse then asked patient if it is ok to do a head to toe assessment as it is part of my shift requirements with each patient". Patient stated "why do you have to do that?" Nurse educated patient telling him to assess his sounds, skin for any other breakdown or abnormal areas, lung assessment, etc. Patient stated "they already did that today, no I don't need it again". Assessment not performed due to patient refusal. Patient did request pain meds and took his oral ascorbic acid after telling nurse that the ascorbic acid was not a real medication.  ?

## 2021-07-16 NOTE — TOC Progression Note (Signed)
Transition of Care (TOC) - Progression Note  ? ? ?Patient Details  ?Name: Jonathon York ?MRN: [id number] ?Date of Birth: Jan 27, 1976 ? ?Transition of Care (TOC) CM/SW Contact  ?Kerin Salen, RN ?Phone Number: ?07/16/2021, 12:49 PM ? ?Clinical Narrative: Patient not eligible for LTAC, TOC to continue to monitor for SNF placement. Attending and Nurse notified.   ? ? ? ?  ?  ? ?Expected Discharge Plan and Services ?  ?  ?  ?  ?  ?                ?  ?  ?  ?  ?  ?  ?  ?  ?  ?  ? ? ?Social Determinants of Health (SDOH) Interventions ?  ? ?Readmission Risk Interventions ?No flowsheet data found. ? ?

## 2021-07-17 ENCOUNTER — Encounter
Admission: AD | Disposition: A | Discharge status: Home Health | Payer: Self-pay | Source: Ambulatory Visit | Attending: Internal Medicine

## 2021-07-17 ENCOUNTER — Inpatient Hospital Stay
Admission: AD | Admit: 2021-07-17 | Discharge: 2021-07-17 | Disposition: A | Discharge status: Home / Self Care | Payer: Medicare HMO | Source: Ambulatory Visit | Attending: Internal Medicine | Admitting: Internal Medicine

## 2021-07-17 DIAGNOSIS — B952 Enterococcus as the cause of diseases classified elsewhere: Secondary | ICD-10-CM | POA: Diagnosis not present

## 2021-07-17 DIAGNOSIS — M86651 Other chronic osteomyelitis, right thigh: Secondary | ICD-10-CM | POA: Diagnosis not present

## 2021-07-17 HISTORY — PX: TEE WITHOUT CARDIOVERSION: SHX5443

## 2021-07-17 LAB — VITAMIN E
Vitamin E (Alpha Tocopherol): 7.8 mg/L (ref 7.0–25.1)
Vitamin E(Gamma Tocopherol): 1.8 mg/L (ref 0.5–5.5)

## 2021-07-17 LAB — VITAMIN A: Vitamin A (Retinoic Acid): 6 ug/dL — ABNORMAL LOW (ref 20.1–62.0)

## 2021-07-17 SURGERY — ECHOCARDIOGRAM, TRANSESOPHAGEAL
Anesthesia: Moderate Sedation

## 2021-07-17 MED ORDER — FENTANYL CITRATE (PF) 100 MCG/2ML IJ SOLN
INTRAMUSCULAR | Status: AC
  Filled 2021-07-17: qty 2

## 2021-07-17 MED ORDER — BUTAMBEN-TETRACAINE-BENZOCAINE 2-2-14 % EX AERO
INHALATION_SPRAY | CUTANEOUS | Status: AC
  Filled 2021-07-17: qty 5

## 2021-07-17 MED ORDER — SODIUM CHLORIDE 0.9 % IV SOLN: INTRAVENOUS | Status: DC

## 2021-07-17 MED ORDER — MIDAZOLAM HCL 2 MG/2ML IJ SOLN
INTRAMUSCULAR | Status: AC | PRN
  Administered 2021-07-17 (×2): 1 mg via INTRAVENOUS

## 2021-07-17 MED ORDER — LIDOCAINE VISCOUS HCL 2 % MT SOLN
OROMUCOSAL | Status: AC
  Filled 2021-07-17: qty 15

## 2021-07-17 MED ORDER — FENTANYL CITRATE (PF) 100 MCG/2ML IJ SOLN
INTRAMUSCULAR | Status: AC | PRN
  Administered 2021-07-17: 25 ug via INTRAVENOUS

## 2021-07-17 MED ORDER — MIDAZOLAM HCL 2 MG/2ML IJ SOLN
INTRAMUSCULAR | Status: AC
  Filled 2021-07-17: qty 4

## 2021-07-17 MED ORDER — BUTAMBEN-TETRACAINE-BENZOCAINE 2-2-14 % EX AERO
INHALATION_SPRAY | CUTANEOUS | Status: AC | PRN
  Administered 2021-07-17: 1 via TOPICAL

## 2021-07-17 MED ORDER — LIDOCAINE VISCOUS HCL 2 % MT SOLN
OROMUCOSAL | Status: AC | PRN
  Administered 2021-07-17: 15 mL via OROMUCOSAL

## 2021-07-17 MED ORDER — SODIUM CHLORIDE FLUSH 0.9 % IV SOLN
INTRAVENOUS | Status: AC
  Filled 2021-07-17: qty 10

## 2021-07-17 NOTE — CV Procedure (Signed)
Transesophageal echocardiogram preliminary report ? ?Jonathon York ?[id number] ?05/18/1975 ? ?Preliminary diagnosis ? Bacteremia with possible endocarditis ? ?Postprocedural diagnosis ?Normal LV function without evidence of endocarditis or vegetation ? ?Time out ?A timeout was performed by the nursing staff and physicians specifically identifying the procedure performed, identification of the patient, the type of sedation, all allergies and medications, all pertinent medical history, and presedation assessment of nasopharynx. ?The patient and or family understand the risks of the procedure including the rare risks of death, stroke, heart attack, esophogeal perforation, sore throat, and reaction to medications given. ? ?Moderate sedation ?During this procedure the patient has received Versed 2 milligrams and fentanyl 25 micrograms to achieve appropriate moderate sedation.  The patient had continued monitoring of heart rate, oxygenation, blood pressure, respiratory rate, and extent of signs of sedation throughout the entire procedure.  The patient received this moderate sedation over a period of 9 minutes.  Both the nursing staff and I were present during the procedure when the patient had moderate sedation for 100% of the time. ? ?Treatment considerations ? No additional treatment considerations needed for bacteremia due to no current evidence of endocarditis ? ?For further details of transesophageal echocardiogram please refer to final report. ? ?Signed,  ?Lamar Blinks M.D. FACC ?07/17/2021 9:14 AM ? ?

## 2021-07-17 NOTE — Consult Note (Signed)
? ?[facility] Cardiology Consultation Note  ?Patient ID: ICARUS BRANT, MRN: [id number], DOB/AGE: May 14, 1975 46 y.o. ?Admit date: 07/09/2021   Date of Consult: 07/17/2021 ?Primary Physician: Mick Sell, MD ?Primary Cardiologist: None ? ?Chief Complaint: No chief complaint on file. ? ?Reason for Consult:  Endocarditis ? ?HPI: 46 y.o. 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  The patient has undergone a transesophageal echocardiogram showing normal LV systolic function with ejection fraction of 60% with normal valves and no evidence of vegetation.  The patient incidentally does have an atrial septal aneurysm and a patent foramen ovale.  There would be no further treatment options at this time due to this finding. ? ?Past Medical History:  ?Diagnosis Date  ? AKI (acute kidney injury) (HCC)   ? Anxiety   ? Decubitus ulcer   ? Depression   ? Neurogenic bladder   ? Osteomyelitis (HCC)   ? Paraparesis of both lower limbs (HCC) 02/21/00  ?   ? ?Surgical History:  ?Past Surgical History:  ?Procedure Laterality Date  ? AMPUTATION Right 03/19/2018  ? Procedure: 5th Metatarsal Resection;  Surgeon: Recardo Evangelist, DPM;  Location: [facility] ORS;  Service: Podiatry;  Laterality: Right;  ? AMPUTATION TOE Right 04/29/2015  ? Procedure: AMPUTATION TOE;  Surgeon: Linus Galas, MD;  Location: [facility] ORS;  Service: Podiatry;  Laterality: Right;  ? INCISION AND DRAINAGE ABSCESS N/A 03/24/2021  ? Procedure: INCISION AND DRAINAGE STAGE IV DECUBITUS ULCER;  Surgeon: Carolan Shiver, MD;  Location: [facility] ORS;  Service: General;  Laterality: N/A;  ? IRRIGATION AND DEBRIDEMENT BUTTOCKS    ? TOE AMPUTATION Right   ?  ? ?Home Meds: ?Prior to Admission medications   ?Medication Sig Start Date End Date Taking? Authorizing Provider  ?Chlorhexidine Gluconate Cloth 2 % PADS Apply 6 each topically daily. ?Patient  not taking: Reported on 07/09/2021 04/03/21   Lorin Glass, MD  ?collagenase (SANTYL) ointment Apply topically 2 (two) times daily. ?Patient not taking: Reported on 07/09/2021 04/02/21   Lorin Glass, MD  ?feeding supplement (ENSURE ENLIVE / ENSURE PLUS) LIQD Take 237 mLs by mouth 3 (three) times daily between meals. 04/02/21   Lorin Glass, MD  ?HYDROcodone-acetaminophen (NORCO/VICODIN) 5-325 MG tablet Take 1-2 tablets by mouth every 6 (six) hours as needed (breakthrough pain). ?Patient not taking: Reported on 07/09/2021 04/02/21   Lorin Glass, MD  ?HYDROmorphone (DILAUDID) 2 MG tablet Take 0.5 tablets (1 mg total) by mouth every 6 (six) hours as needed for moderate pain. ?Patient not taking: Reported on 07/09/2021 04/02/21   Lorin Glass, MD  ?magic mouthwash w/lidocaine SOLN Take 2 mLs by mouth 3 (three) times daily as needed for mouth pain. ?Patient not taking: Reported on 07/09/2021 04/02/21   Lorin Glass, MD  ?meropenem 1 g in sodium chloride 0.9 % 100 mL Inject 1 g into the vein every 8 (eight) hours. 04/02/21   Lorin Glass, MD  ?multivitamin-lutein (OCUVITE-LUTEIN) CAPS capsule Take 1 capsule by mouth daily. ?Patient not taking: Reported on 07/09/2021 04/03/21   Lorin Glass, MD  ?polyethylene glycol (MIRALAX / GLYCOLAX) 17 g packet Take 17 g by mouth daily as needed for moderate constipation. ?Patient not taking: Reported on 07/09/2021 04/02/21   Lorin Glass, MD  ?traZODone (DESYREL) 50 MG tablet Take 1 tablet (50 mg total) by mouth at bedtime as needed for sleep. ?Patient not taking: Reported on 07/09/2021 04/02/21   Lorin Glass,  MD  ?valACYclovir (VALTREX) 500 MG tablet Take 1 tablet (500 mg total) by mouth 2 (two) times daily. ?Patient not taking: Reported on 07/09/2021 04/02/21   Terrilee Croak, MD  ? ? ?Inpatient Medications:  ? [MAR Hold] vitamin C  500 mg Oral BID  ? butamben-tetracaine-benzocaine      ? [MAR Hold] Chlorhexidine Gluconate Cloth  6 each Topical Daily  ? fentaNYL      ? [MAR Hold]  hydrocortisone cream   Topical BID  ? lidocaine      ? midazolam      ? [MAR Hold] multivitamin-lutein  1 capsule Oral Daily  ? [MAR Hold] nutrition supplement (JUVEN)  1 packet Oral BID BM  ? [MAR Hold] pantoprazole  40 mg Oral Q1200  ? [MAR Hold] Ensure Max Protein  11 oz Oral BID  ? [MAR Hold] sodium chloride flush  10-40 mL Intracatheter Q12H  ? sodium chloride flush      ? [MAR Hold] vitamin B-12  1,000 mcg Oral Daily  ? [MAR Hold] Vitamin D (Ergocalciferol)  50,000 Units Oral Q7 days  ? [MAR Hold] zinc sulfate  220 mg Oral Daily  ? ? [MAR Hold] sodium chloride 10 mL/hr at 07/16/21 0502  ? sodium chloride 20 mL/hr at 07/17/21 0758  ? [MAR Hold] ampicillin-sulbactam (UNASYN) IV 3 g (07/17/21 0512)  ? ? ?Allergies:  ?Allergies  ?Allergen Reactions  ? Orange Fruit [Citrus] Other (See Comments)  ?  Blisters only from [place]!!! Patient is not allergic from all citrus  ? Sulfa Antibiotics   ?  Throwing up blood   ? ? ?Social History  ? ?Socioeconomic History  ? Marital status: Single  ?  Spouse name: Not on file  ? Number of children: Not on file  ? Years of education: Not on file  ? Highest education level: Not on file  ?Occupational History  ? Occupation: disabled  ?Tobacco Use  ? Smoking status: Former  ?  Packs/day: 0.50  ?  Types: Cigarettes  ?  Quit date: 2018  ?  Years since quitting: 5.2  ? Smokeless tobacco: Never  ?Vaping Use  ? Vaping Use: Never used  ?Substance and Sexual Activity  ? Alcohol use: No  ? 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 Sexual activity: Never  ?Other Topics Concern  ? Not on file  ?Social History Narrative  ? Not on file  ? ?Social Determinants of Health  ? ?Financial Resource Strain: Not on file  ?Food Insecurity: Not on file  ?Transportation Needs: Not on file  ?Physical Activity: Not on file  ?Stress: Not on file  ?Social Connections: Not on file  ?Intimate Partner Violence: Not on file  ?  ? ?Family History  ?Problem Relation Age of Onset  ?  Lymphoma Sister   ?  ? ?Review of Systems ?Positive for weakness ?Negative for: ?General:  chills, fever, night sweats or weight changes.  ?Cardiovascular: PND orthopnea syncope dizziness  ?Dermatological skin lesions rashes ?Respiratory: Cough congestion ?Urologic: Frequent urination urination at night and hematuria ?Abdominal: negative for nausea, vomiting, diarrhea, bright red blood per rectum, melena, or hematemesis ?Neurologic: negative for visual changes, and/or hearing changes  ?All other systems reviewed and are otherwise negative except as noted above. ? ?Labs: ?No results for input(s): CKTOTAL, CKMB, TROPONINI in the last 72 hours. ?Lab Results  ?Component Value Date  ? WBC 5.1 07/16/2021  ? HGB 9.1 (L) 07/16/2021  ?  HCT 31.4 (L) 07/16/2021  ? MCV 79.9 (L) 07/16/2021  ? PLT 309 07/16/2021  ?  ?Recent Labs  ?Lab 07/16/21 ?0340  ?NA 139  ?K 3.9  ?CL 108  ?CO2 26  ?BUN 10  ?CREATININE 0.48*  ?CALCIUM 8.1*  ?GLUCOSE 94  ? ?Lab Results  ?Component Value Date  ? TRIG 162 (H) 06/27/2021  ? ?No results found for: DDIMER ? ?Radiology/Studies:  ?CT ABDOMEN PELVIS WO CONTRAST ? ?Result Date: 06/25/2021 ?CLINICAL DATA:  Evaluate for osteomyelitis in both hips EXAM: CT ABDOMEN AND PELVIS WITHOUT CONTRAST TECHNIQUE: Multidetector CT imaging of the abdomen and pelvis was performed following the standard protocol without IV contrast. RADIATION DOSE REDUCTION: This exam was performed according to the departmental dose-optimization program which includes automated exposure control, adjustment of the mA and/or kV according to patient size and/or use of iterative reconstruction technique. COMPARISON:  05/13/2021 FINDINGS: Lower chest: Visualized lower lung fields are clear. Hepatobiliary: Liver measures 19.3 cm in length. No focal abnormality is seen. Gallbladder is unremarkable. Pancreas: No focal abnormality is seen. Spleen: Spleen is enlarged measuring 16.9 cm in maximum diameter. Adrenals/Urinary Tract: Adrenals are  unremarkable. There is no hydronephrosis. There are no renal or ureteral stones. Suprapubic cystostomy catheter is noted in place. Stomach/Bowel: Stomach is not distended. Small bowel loops are not dilated. Appendix is n

## 2021-07-17 NOTE — Evaluation (Signed)
Occupational Therapy Evaluation ?Patient Details ?Name: Jonathon York ?MRN: [id number] ?DOB: [birth year] ?Today's Date: 07/17/2021 ? ? ?History of Present Illness 46 y.o. male with medical history significant of paraplegia, neurogenic bladder, s/p for suprapubic catheter, chronic severe sacral ulcer, anemia, malnutrition, anxiety, UTI, chronic right hip dislocation by CT scan on 06/25/2021, who presents with sacral ulcer with infection.  ? ?Clinical Impression ?  ?Patient presenting with decreased Ind in self care, functional mobility/transfers,endurance, and safety awareness. Patient reports recently discharging home alone from [facility]. He has no running water or electricity and has large wound which he is unable to care for at home. Prior to [facility] admission, pt reports being mod I for self care tasks and having food delivered. He does not have family or friends to assist. Pt demonstrates long sitting without assistance. OT intervention is limited secondary to large sacral wound. Pt's UEs are not strong enough to perform squat pivot transfer and would otherwize be creating friction on wound. Pt agreeable to and requesting assistance with B UE strengthening and OT provides pt with red , level 2 resistive theraband. OT demonstrating exercises but pt declines to return demonstrations secondary to food arriving. Pt was NPO for procedure and reports being very hungry. OT to assess HEP at next session. Short term rehab recommended to address functional deficits but he may need long term care placement secondary to unsafe home environment and needing assist. Patient will benefit from acute OT to increase overall independence in the areas of ADLs, functional mobility, and safety awareness in order to safely discharge to next venue of care.  ? ?   ? ?Recommendations for follow up therapy are one component of a multi-disciplinary discharge planning process, led by the attending physician.  Recommendations may be updated based on  patient status, additional functional criteria and insurance authorization.  ? ?Follow Up Recommendations ? Skilled nursing-short term rehab (<3 hours/day)  ?  ?Assistance Recommended at Discharge Frequent or constant Supervision/Assistance  ?Patient can return home with the following A lot of help with walking and/or transfers;A lot of help with bathing/dressing/bathroom;Assist for transportation;Help with stairs or ramp for entrance ? ?  ?Functional Status Assessment ? Patient has had a recent decline in their functional status and demonstrates the ability to make significant improvements in function in a reasonable and predictable amount of time.  ?Equipment Recommendations ? Other (comment) (defer to next venue of care)  ?  ?   ?Precautions / Restrictions Precautions ?Precautions: Fall ?Precaution Comments: large sacral wound  ? ?  ? ?Mobility Bed Mobility ?Overal bed mobility: Needs Assistance ?Bed Mobility: Rolling ?Rolling: Supervision ?  ?  ?  ?  ?  ?  ? ?Transfers ?  ?  ?  ?  ?  ?  ?  ?  ?  ?General transfer comment: Pt declined EOB ?  ? ?  ?   ? ?ADL either performed or assessed with clinical judgement  ? ?ADL   ?  ?  ?  ?  ?  ?  ?  ?  ?  ?  ?  ?  ?  ?  ?  ?  ?  ?  ?  ?General ADL Comments: Pt demonstrates long sitting in hospital bed without assistance. He set up tray to feed himself. OT asked pt to demonstrate circle sitting in bed for LB dressing/hygiene/skin inspection but he reports he does not do that at baseline and he utilizes figure four position while seated in wheelchair.  ? ? ? ?  Vision Patient Visual Report: No change from baseline ?   ?   ?   ?   ? ?Pertinent Vitals/Pain Pain Assessment ?Pain Assessment: 0-10 ?Pain Score: 5  ?Pain Location: buttocks/sacrum ?Pain Descriptors / Indicators: Discomfort ?Pain Intervention(s): Monitored during session, Limited activity within patient's tolerance, Repositioned  ? ? ? ?Hand Dominance Left ?  ?Extremity/Trunk Assessment Upper Extremity  Assessment ?Upper Extremity Assessment: Generalized weakness (3+/5 overall) ?  ?Lower Extremity Assessment ?Lower Extremity Assessment: Defer to PT evaluation ?  ?  ?  ?Communication Communication ?Communication: No difficulties ?  ?Cognition Arousal/Alertness: Awake/alert ?Behavior During Therapy: [facility] for tasks assessed/performed ?Overall Cognitive Status: Within Functional Limits for tasks assessed ?  ?  ?  ?  ?  ?  ?  ?  ?  ?  ?  ?  ?  ?  ?  ?  ?  ?  ?  ?   ?   ?   ? ? ?Home Living Family/patient expects to be discharged to:: Private residence ?Living Arrangements: Alone ?Available Help at Discharge: Other (Comment) (Pt reports he is estranged from family) ?Type of Home: House ?Home Access: Ramped entrance ?  ?  ?Home Layout: One level ?  ?  ?Bathroom Shower/Tub: Tub/shower unit ?  ?Bathroom Toilet: Handicapped height ?Bathroom Accessibility: Yes ?  ?Home Equipment: Wheelchair - manual;Shower seat;Grab bars - tub/shower ?  ?Additional Comments: Wheelchair is over 10 years per pt report and has multiple parts combined together to make it functional. ?  ? ?  ?Prior Functioning/Environment   ?  ?  ?  ?  ?Mobility Comments: Pt states his wheelchair is falling apart. He has no water or electricity at home. ?ADLs Comments: Pt reports being mod I for bathing and dressing from wheelchair level. He does not cook at home because of no electricity so he has food delivered. Has fallen multiple times secondary to decreased UE strength for transfer. ?  ? ?  ?  ?OT Problem List: Decreased strength;Decreased activity tolerance;Impaired balance (sitting and/or standing);Decreased safety awareness;Impaired sensation;Pain ?  ?   ?OT Treatment/Interventions: Self-care/ADL training;Therapeutic exercise;Therapeutic activities;Energy conservation;DME and/or AE instruction;Manual therapy;Balance training;Modalities;Patient/family education  ?  ?OT Goals(Current goals can be found in the care plan section) Acute Rehab OT Goals ?Patient  Stated Goal: to get stonger ?OT Goal Formulation: With patient ?Time For Goal Achievement: 07/31/21 ?Potential to Achieve Goals: Fair ?ADL Goals ?Pt Will Perform Grooming: sitting;with set-up ?Pt Will Perform Lower Body Dressing: with set-up;bed level ?Pt Will Transfer to Toilet: with supervision;squat pivot transfer;bedside commode ?Pt/caregiver will Perform Home Exercise Program: Both right and left upper extremity;With theraband;With written HEP provided;Independently  ?OT Frequency: Min 2X/week ?  ? ?   ?AM-PAC OT "6 Clicks" Daily Activity     ?Outcome Measure Help from another person eating meals?: None ?Help from another person taking care of personal grooming?: None ?Help from another person toileting, which includes using toliet, bedpan, or urinal?: A Lot ?Help from another person bathing (including washing, rinsing, drying)?: A Lot ?Help from another person to put on and taking off regular upper body clothing?: None ?Help from another person to put on and taking off regular lower body clothing?: A Lot ?6 Click Score: 18 ?  ?End of Session Nurse Communication: Mobility status ? ?Activity Tolerance: Patient limited by pain ?Patient left: in bed;with call bell/phone within reach;with bed alarm set;Other (comment) (eating breakfast) ? ?OT Visit Diagnosis: Repeated falls (R29.6);Muscle weakness (generalized) (M62.81);History of falling (Z91.81)  ?              ?  Time: [id number] ?OT Time Calculation (min): 18 min ?Charges:  OT General Charges ?$OT Visit: 1 Visit ?OT Evaluation ?$OT Eval Moderate Complexity: 1 Mod ?OT Treatments ?$Therapeutic Activity: 8-22 mins ? ?Jackquline DenmarkKatie Yunique Dearcos, MS, OTR/L , CBIS ?ascom [phone]  ?07/17/21, 3:04 PM  ?

## 2021-07-17 NOTE — Progress Notes (Addendum)
? ? ? ?Progress Note  ? ? ?Jonathon York  [id number] DOB: Mar 29, 1976  DOA: 07/09/2021 ?PCP: Leonel Ramsay, MD  ? ? ? ? ?Brief Narrative:  ? ? ?Medical records reviewed and are as summarized below: ? ?MARINUS BAUERLEIN is a 46 y.o. male with medical history significant for paraplegia, neurogenic bladder, s/p suprapubic catheter, chronic severe sacral decubitus ulcers, chronic anemia, malnutrition, anxiety, UTI, chronic right hip dislocation.  He was discharged from [facility] on 04/02/2021 after hospitalization for septic shock, strep anginosus bacteremia, stage IV sacral decubitus ulcers and osteomyelitis of bilateral hips with involvement of the sacrum and coccyx.  He underwent debridement of the sacral wound at that time.  Pseudomonas aeruginosa was isolated from the wound.  He was subsequently discharged on IV meropenem, to [facility] where he stayed for  about 3 months.  He said he had a right IJ central line placed on 07/01/2021 and he was discharged home on 07/03/2021 from the [facility] hospital.  However, he said that he had not been taking the IV antibiotics because he did not have any output or and also he did not have any electricity or water at home.   ? ?He had increasing pain in the hip and sacral wounds and noticed increasing drainage from the sacral wounds as well.  He was referred to the hospital for direct admission by Dr. Ola Spurr, his PCP and ID physician, for evaluation of  worsening sacral decubitus ulcer and bilateral hip osteomyelitis.  He has been following up with ID for his sacral ulcer but this has gotten worse. ? ? ?He was found to have Enterococcus bacteremia on this admission.  Dr. Windell Moment, general surgeon, was consulted but from his standpoint, there was no indication for debridement.  Dr. Sharlet Salina, orthopedic surgeon, was also consulted to assist with management.  He recommended referral to a tertiary center for further management because of the complexity of the  case. ? ? ? ? ? ?Assessment/Plan:  ? ?Principal Problem: ?  Enterococcus faecalis infection ?Active Problems: ?  Sacral decubitus ulcer, stage IV ([place]) ?  Paraplegia ([place]) ?  Neurogenic bladder ?  Protein-calorie malnutrition, severe ?  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[place]) ?  Microcytic anemia ?  GERD (gastroesophageal reflux disease) ?  Diarrhea ?  Chronic osteomyelitis involving pelvic region and thigh affecting right side ([place]) ?  Enterococcal bacteremia ? ? ?Nutrition Problem: Moderate Malnutrition ?Etiology: chronic illness (non healing sacral wound, paraplegia) ? ?Signs/Symptoms: moderate fat depletion, moderate muscle depletion, severe muscle depletion ? ? ?Body mass index is 15.15 kg/m?.  ? ? ?Enterococcus faecalis bacteremia, ?Left hip osteomyelitis, right hip osteomyelitis with chronic right hip dislocation, chronic bony destruction of the lower sacrum and coccyx, stage IV sacral decubitus ulcers ?Blood culture from 07/09/2021 was positive for Enterococcus faecalis.  No growth on repeat blood culture.   ?Patient had TEE on 07/17/2021 and there was no evidence of vegetations/infective endocarditis. ? ?Dr. Sharlet Salina, orthopedic surgeon, saw the patient on 07/11/2021 and recommended transfer to tertiary center for management of complex bilateral chronic hip osteomyelitis, chronic right hip dislocation and stage IV sacral decubitus ulcers.  Patient will follow-up with the [facility] wound care center to establish care with a plastic surgeon and orthopedic surgeon. ? ? ?Leukopenia/neutropenia ?Improved ? ? ?Acute on chronic anemia, vitamin B12 deficiency ?H&H improved.  S/p transfusion with 1 unit of PRBCs on 07/14/2021.  S/p transfusion with 1 unit of PRBCs on 07/10/2021.  S/p 3 doses of IV iron sucrose.  Change vitamin B12 injection to oral vitamin B12 ? ? ?Hypokalemia ?Improved.  Continue to monitor electrolytes ? ?Paraplegia, neurogenic bladder ?Suprapubic catheter in place ? ? ?Severe protein caloric malnutrition,  vitamin D deficiency ?Started high-dose vitamin D on 07/12/2021 ?Continue nutritional supplements.  Follow-up with dietitian ? ? ?Debility ?Consult PT and OT.  Patient used to have good upper body strength but he has gotten weaker.  ? ? ? ?07/11/2021- I spoke to Dr. Aretha Parrot, orthopedic surgeon, at [facility], on 07/11/2021 at 4:40 pm. He will be happy to see the patient in consultation when he gets to [facility].  However, he requested that I contact the hospitalist team so the patient will be at the admitted under the hospitalist service. I was told by the transfer coordinator on the [facility] line that there are no beds and wanted me to call back again to see if there are beds available. ? ?07/12/2021- I called [facility] PAL line at 8:02 AM on 07/12/2021.  I spoke to [place], Scientist, research (life sciences).  She said there were no beds and advised that I call back.  I called back at 3:55 PM and spoke to [facility] again regarding bed availability.  She said they are still at capacity and do not have a bed for Mr. Padlo. ? ?I called [facility] at 4:15 PM on 07/12/2021 to initiate transfer.  Unfortunately they have no beds. ? ?07/13/2021-  I spoke with [facility], transfer coordinator at [facility] at 2:28 pm. She said there are no beds available. ?I called [facility] at 2:59 PM today.  I spoke with Dr. Shawnie Dapper, hospitalist, at 6:05pm.  She said she is unable to accept the patient in transfer.  She said there is no way plastic surgeon will see the patient in the inpatient setting in his current condition.  They typically would see the patient in the office, optimize the patient for surgery including addressing conditions like malnutrition and make sure patient is medically adherent before scheduling surgery.  She recommended outpatient follow-up to the [facility] wound care center. ? ? ? ?Diet Order   ? ?       ?  Diet regular Room service  appropriate? Yes; Fluid consistency: Thin  Diet effective now       ?  ? ?  ?  ? ?  ? ? ? ?Consultants: ?General surgeon ?Orthopedic surgeon ?Infectious disease ? ?Procedures: ?None ? ? ? ?Medications:  ? ? vitamin C  500 mg Oral BID  ? butamben-tetracaine-benzocaine      ? Chlorhexidine Gluconate Cloth  6 each Topical Daily  ? fentaNYL      ? hydrocortisone cream   Topical BID  ? lidocaine      ? midazolam      ? multivitamin-lutein  1 capsule Oral Daily  ? nutrition supplement (JUVEN)  1 packet Oral BID BM  ? pantoprazole  40 mg Oral Q1200  ? Ensure Max Protein  11 oz Oral BID  ? sodium chloride flush  10-40 mL Intracatheter Q12H  ? sodium chloride flush      ? vitamin B-12  1,000 mcg Oral Daily  ? Vitamin D (Ergocalciferol)  50,000 Units Oral Q7 days  ? zinc sulfate  220 mg Oral Daily  ? ?Continuous Infusions: ? sodium chloride 10 mL/hr at 07/16/21 0502  ? ampicillin-sulbactam (UNASYN) IV 3 g (07/17/21 1234)  ? ? ? ?Anti-infectives (From admission,  onward)  ? ? Start     Dose/Rate Route Frequency Ordered Stop  ? 07/10/21 1800  vancomycin (VANCOREADY) IVPB 1250 mg/250 mL  Status:  Discontinued       ? 1,250 mg ?166.7 mL/hr over 90 Minutes Intravenous Every 24 hours 07/09/21 1747 07/10/21 1125  ? 07/10/21 1230  Ampicillin-Sulbactam (UNASYN) 3 g in sodium chloride 0.9 % 100 mL IVPB       ? 3 g ?200 mL/hr over 30 Minutes Intravenous Every 6 hours 07/10/21 1125    ? 07/09/21 1900  meropenem (MERREM) 1 g in sodium chloride 0.9 % 100 mL IVPB  Status:  Discontinued       ? 1 g ?200 mL/hr over 30 Minutes Intravenous Every 8 hours 07/09/21 1747 07/10/21 1125  ? 07/09/21 1830  vancomycin (VANCOCIN) IVPB 1000 mg/200 mL premix       ? 1,000 mg ?200 mL/hr over 60 Minutes Intravenous  Once 07/09/21 1725 07/09/21 2214  ? ?  ? ? ? ? ? ? ? ? ? ?Family Communication/Anticipated D/C date and plan/Code Status  ? ?DVT prophylaxis: Place and maintain sequential compression device Start: 07/10/21 0828 ? ? ?  Code Status: Full  Code ? ?Family Communication: None ?Disposition Plan: Plan to discharge to SNF ? ? ?Status is: Inpatient ?Remains inpatient appropriate because: IV antibiotics ? ? ? ? ? ? ?Subjective:  ? ?Interval events noted.  He has no complaints.

## 2021-07-17 NOTE — Progress Notes (Addendum)
Nurse asked patient upon initial rounds with day shift nurse present if he would be ok to have dressing change done on shift as scheduled and if he is ok to allow nurse to assess him on this shift. Nurse asked this with another nurse present as patient had refused both for nurse during last nights shift. Patient stated that he did not want his dressing changed on night shift that they can do tomorrow and also stated that he does not need a head to toe assessment. Nurse present when patient stated this was Jonathon York. ?

## 2021-07-17 NOTE — TOC Progression Note (Signed)
Transition of Care (TOC) - Progression Note  ? ? ?Patient Details  ?Name: Jonathon York ?MRN: [id number] ?Date of Birth: [birth year] ? ?Transition of Care (TOC) CM/SW Contact  ?Hetty Ely, RN ?Phone Number: ?07/17/2021, 2:07 PM ? ?Clinical Narrative: [facility] RN spoke with patient about SNF placement and if [facility] area was ok to search. Patient nodded his head yes. When asked if he had any other concerns that CM can assist him with, he nodded NO. TOCRN will continue to track.  ? ? ? ?  ?  ? ?Expected Discharge Plan and Services ?  ?  ?  ?  ?  ?                ?  ?  ?  ?  ?  ?  ?  ?  ?  ?  ? ? ?Social Determinants of Health (SDOH) Interventions ?  ? ?Readmission Risk Interventions ?No flowsheet data found. ? ?

## 2021-07-17 NOTE — Progress Notes (Signed)
*  PRELIMINARY RESULTS* ?Echocardiogram ?Echocardiogram Transesophageal has been performed. ? ?Jonathon York, [name] ?07/17/2021, 8:57 AM ?

## 2021-07-17 NOTE — Progress Notes (Signed)
PT Cancellation Note ? ?Patient Details ?Name: Jonathon York ?MRN: [id number] ?DOB: [birth year] ? ? ?Cancelled Treatment:    Reason Eval/Treat Not Completed: Other (comment). Pt declining PT/mobility attempts stating his RN is coming soon to assist with bathing and dressing changes. Pt with flat affect throughout attempt, but agreeable to PT attempt at a later date.  ? ? ?Olga Coaster PT, [name] ?1:55 PM,07/17/21 ? ?

## 2021-07-17 NOTE — Progress Notes (Signed)
Patient continued to refuse wound care this morning and requested it be done later on day shift.  ?

## 2021-07-17 NOTE — Plan of Care (Signed)
Pt is unable to care for wounds himself due to severity and his paraplegic state. Patient has flat affect when nurse goes into room and seems to become agitated and refuse care when nurse comes into room to provide care. Patient allowed nurse to provide some care, but refused several areas of care. Patient remains room air and chairfast. Central line in place and infusing.  ?Problem: Education: ?Goal: Knowledge of General Education information will improve ?Description: Including pain rating scale, medication(s)/side effects and non-pharmacologic comfort measures ?Outcome: Progressing ?  ?Problem: Health Behavior/Discharge Planning: ?Goal: Ability to manage health-related needs will improve ?Outcome: Not Progressing ?  ?Problem: Clinical Measurements: ?Goal: Ability to maintain clinical measurements within normal limits will improve ?Outcome: Progressing ?Goal: Will remain free from infection ?Outcome: Progressing ?Goal: Diagnostic test results will improve ?Outcome: Progressing ?Goal: Respiratory complications will improve ?Outcome: Progressing ?Goal: Cardiovascular complication will be avoided ?Outcome: Progressing ?  ?Problem: Activity: ?Goal: Risk for activity intolerance will decrease ?Outcome: Progressing ?  ?Problem: Nutrition: ?Goal: Adequate nutrition will be maintained ?Outcome: Progressing ?  ?Problem: Coping: ?Goal: Level of anxiety will decrease ?Outcome: Progressing ?  ?Problem: Elimination: ?Goal: Will not experience complications related to bowel motility ?Outcome: Progressing ?Goal: Will not experience complications related to urinary retention ?Outcome: Progressing ?  ?Problem: Pain Managment: ?Goal: General experience of comfort will improve ?Outcome: Progressing ?  ?Problem: Safety: ?Goal: Ability to remain free from injury will improve ?Outcome: Progressing ?  ?Problem: Skin Integrity: ?Goal: Risk for impaired skin integrity will decrease ?Outcome: Progressing ?  ?

## 2021-07-17 NOTE — Progress Notes (Signed)
Consent form for TEE prepared and placed in chart, nurse did not complete form with signature of patient and witness as patient stated that provider has not spoken to him regarding procedure. Partially prepared form in chart. ?

## 2021-07-18 ENCOUNTER — Encounter: Payer: Self-pay | Admitting: Internal Medicine

## 2021-07-18 DIAGNOSIS — B952 Enterococcus as the cause of diseases classified elsewhere: Secondary | ICD-10-CM | POA: Diagnosis not present

## 2021-07-18 DIAGNOSIS — M86651 Other chronic osteomyelitis, right thigh: Secondary | ICD-10-CM | POA: Diagnosis not present

## 2021-07-18 DIAGNOSIS — R7881 Bacteremia: Secondary | ICD-10-CM | POA: Diagnosis not present

## 2021-07-18 DIAGNOSIS — Z9359 Other cystostomy status: Secondary | ICD-10-CM | POA: Diagnosis not present

## 2021-07-18 LAB — CBC WITH DIFFERENTIAL/PLATELET
Abs Immature Granulocytes: 0.02 10*3/uL (ref 0.00–0.07)
Basophils Absolute: 0 10*3/uL (ref 0.0–0.1)
Basophils Relative: 1 %
Eosinophils Absolute: 0.1 10*3/uL (ref 0.0–0.5)
Eosinophils Relative: 2 %
HCT: 33.6 % — ABNORMAL LOW (ref 39.0–52.0)
Hemoglobin: 9.7 g/dL — ABNORMAL LOW (ref 13.0–17.0)
Immature Granulocytes: 0 %
Lymphocytes Relative: 19 %
Lymphs Abs: 1.1 10*3/uL (ref 0.7–4.0)
MCH: 22.9 pg — ABNORMAL LOW (ref 26.0–34.0)
MCHC: 28.9 g/dL — ABNORMAL LOW (ref 30.0–36.0)
MCV: 79.2 fL — ABNORMAL LOW (ref 80.0–100.0)
Monocytes Absolute: 0.3 10*3/uL (ref 0.1–1.0)
Monocytes Relative: 6 %
Neutro Abs: 4.1 10*3/uL (ref 1.7–7.7)
Neutrophils Relative %: 72 %
Platelets: 328 10*3/uL (ref 150–400)
RBC: 4.24 MIL/uL (ref 4.22–5.81)
RDW: 19.9 % — ABNORMAL HIGH (ref 11.5–15.5)
WBC: 5.7 10*3/uL (ref 4.0–10.5)
nRBC: 0 % (ref 0.0–0.2)

## 2021-07-18 LAB — MAGNESIUM: Magnesium: 2.1 mg/dL (ref 1.7–2.4)

## 2021-07-18 LAB — BASIC METABOLIC PANEL
Anion gap: 6 (ref 5–15)
BUN: 11 mg/dL (ref 6–20)
CO2: 27 mmol/L (ref 22–32)
Calcium: 8.1 mg/dL — ABNORMAL LOW (ref 8.9–10.3)
Chloride: 106 mmol/L (ref 98–111)
Creatinine, Ser: 0.65 mg/dL (ref 0.61–1.24)
GFR, Estimated: >60 mL/min (ref >60)
Glucose, Bld: 95 mg/dL (ref 70–99)
Potassium: 3.8 mmol/L (ref 3.5–5.1)
Sodium: 139 mmol/L (ref 135–145)

## 2021-07-18 LAB — PHOSPHORUS: Phosphorus: 4.1 mg/dL (ref 2.5–4.6)

## 2021-07-18 MED ORDER — ENSURE ENLIVE PO LIQD
237.0000 mL | Freq: Three times a day (TID) | ORAL | Status: DC
  Administered 2021-07-18 – 2021-07-25 (×7): 237 mL via ORAL

## 2021-07-18 MED ORDER — ALTEPLASE 2 MG IJ SOLR
2.0000 mg | Freq: Once | INTRAMUSCULAR | Status: AC
  Administered 2021-07-18: 2 mg
  Filled 2021-07-18: qty 2

## 2021-07-18 MED ORDER — VITAMIN A 3 MG (10000 UNIT) PO CAPS
10000.0000 [IU] | ORAL_CAPSULE | Freq: Every day | ORAL | Status: DC
  Administered 2021-07-18: 10000 [IU] via ORAL
  Filled 2021-07-18 (×11): qty 1

## 2021-07-18 MED ORDER — VITAMIN E 45 MG (100 UNIT) PO CAPS
400.0000 [IU] | ORAL_CAPSULE | Freq: Every day | ORAL | Status: DC
  Administered 2021-07-18: 400 [IU] via ORAL
  Filled 2021-07-18 (×11): qty 4

## 2021-07-18 NOTE — Evaluation (Signed)
Physical Therapy Evaluation ?Patient Details ?Name: Jonathon York ?MRN: [id number] ?DOB: 11/02/1975 ?Today's Date: 07/18/2021 ? ?History of Present Illness ? 46 y.o. male with medical history significant of paraplegia, neurogenic bladder, s/p for suprapubic catheter, chronic severe sacral ulcer, anemia, malnutrition, anxiety, UTI, chronic right hip dislocation by CT scan on 06/25/2021, who presents with sacral ulcer with infection. ?  ?Clinical Impression ? Pt awake and alert resting in bed upon PT entrance into room for evaluation today. Pt did not provide a quantitative value for current pain at rest; is willing to work w/ PT today. Pt history and PLOF was obtained per OT evaluation in chart history. ? ?Pt report that his wound dressing on his R hip felt as if it was no longer securely attached. He was able to perform supine to long sit and rolling (R & L), w/ modI and reliance on bed rails for support due to decreased UE strength for PT to check bandage/dressing prior to continuation of mobility/transfers. R hip wound dressing was coming off and unable to be placed to cover Pt wound adequately; further mobility/transfers were differed until RN could replace bandage. Pt left w/ all needs w/in reach and RN was notified of the dressing change needed, prior to PT exiting room. Pt will benefit from continued skilled PT in order to increase overall independence, functional mobility, and safety awareness to restore PLOF. Current discharge recommendation to SNF is appropriate due to the level of assistance required by the patient to ensure safety and improve overall function. ?  ? ?Recommendations for follow up therapy are one component of a multi-disciplinary discharge planning process, led by the attending physician.  Recommendations may be updated based on patient status, additional functional criteria and insurance authorization. ? ?Follow Up Recommendations Skilled nursing-short term rehab (<3 hours/day) ? ?   ?Assistance Recommended at Discharge Frequent or constant Supervision/Assistance  ?Patient can return home with the following ? A little help with walking and/or transfers;A little help with bathing/dressing/bathroom;Assistance with cooking/housework;Assist for transportation;Help with stairs or ramp for entrance ? ?  ?Equipment Recommendations    ?Recommendations for Other Services ?    ?  ?Functional Status Assessment Patient has had a recent decline in their functional status and demonstrates the ability to make significant improvements in function in a reasonable and predictable amount of time.  ? ?  ?Precautions / Restrictions Precautions ?Precautions: Fall ?Precaution Comments: large sacral wound  ? ?  ? ?Mobility ? Bed Mobility ?Overal bed mobility: Modified Independent ?Bed Mobility: Rolling, Supine to Sit ?Rolling: Modified independent (Device/Increase time) ?  ?Supine to sit: Modified independent (Device/Increase time) ?  ?  ?General bed mobility comments: reliance on bed rails ?  ? ?Transfers ?  ?  ?  ?  ?  ?  ?  ?  ?  ?General transfer comment: Deferred due to sacral bandage coming off ?  ? ?Ambulation/Gait ?  ?  ?  ?  ?  ?  ?  ?  ? ?Stairs ?  ?  ?  ?  ?  ? ?Wheelchair Mobility ?  ? ?Modified Rankin (Stroke Patients Only) ?  ? ?  ? ?Balance   ?Sitting-balance support: Feet unsupported, No upper extremity supported ?Sitting balance-Leahy Scale: Fair ?Sitting balance - Comments: long sitting in bed; deferred EOB sit due to exposed sacral wound needing bandage change. ?  ?  ?  ?  ?  ?  ?  ?  ?  ?  ?  ?  ?  ?  ?  ?   ? ? ? ?  Pertinent Vitals/Pain Pain Assessment ?Pain Assessment: No/denies pain  ? ? ?Home Living Family/patient expects to be discharged to:: Private residence ?Living Arrangements: Alone ?Available Help at Discharge: Other (Comment) (Per OT Eval: "Pt reports he is estranged from family") ?Type of Home: House ?Home Access: Ramped entrance ?  ?  ?  ?Home Layout: One level ?Home Equipment:  Wheelchair - manual;Shower seat;Grab bars - tub/shower ?Additional Comments: Per OT Eval: "Wheelchair is over 10 years per pt report and has multiple parts combined together to make it functional."  ?  ?Prior Function   ?  ?  ?  ?  ?  ?  ?Mobility Comments: Per OT Eval: "Pt states his wheelchair is falling apart. He has no water or electricity at home." ?ADLs Comments: Per OT Eval: "Pt reports being mod I for bathing and dressing from wheelchair level. He does not cook at home because of no electricity so he has food delivered. Has fallen multiple times secondary to decreased UE strength for transfer." ?  ? ? ?Hand Dominance  ?   ? ?  ?Extremity/Trunk Assessment  ? Upper Extremity Assessment ?Upper Extremity Assessment: Generalized weakness (3+/5 to 4-/5 overall) ?  ? ?Lower Extremity Assessment ?Lower Extremity Assessment: RLE deficits/detail;LLE deficits/detail (Per pt. report has no LE sensation/bladder or bowel sensation; does endorse "burning" when asked about sacral/hip wounds.) ?  ? ?   ?Communication  ?    ?Cognition Arousal/Alertness: Awake/alert ?Behavior During Therapy: [facility] for tasks assessed/performed ?Overall Cognitive Status: Within Functional Limits for tasks assessed ?  ?  ?  ?  ?  ?  ?  ?  ?  ?  ?  ?  ?  ?  ?  ?  ?  ?  ?  ? ?  ?General Comments   ? ?  ?Exercises    ? ?Assessment/Plan  ?  ?PT Assessment Patient needs continued PT services  ?PT Problem List Decreased strength;Decreased mobility;Decreased safety awareness;Decreased coordination;Decreased activity tolerance;Decreased balance;Pain;Impaired sensation ? ?   ?  ?PT Treatment Interventions DME instruction;Therapeutic exercise;Gait training;Balance training;Functional mobility training;Therapeutic activities;Patient/family education;Neuromuscular re-education   ? ?PT Goals (Current goals can be found in the Care Plan section)  ?Acute Rehab PT Goals ?Patient Stated Goal: to get wounds better to go home ?PT Goal Formulation: With patient ?Time  For Goal Achievement: 08/01/21 ?Potential to Achieve Goals: Fair ? ?  ?Frequency Min 2X/week ?  ? ? ?Co-evaluation   ?  ?  ?  ?  ? ? ?  ?AM-PAC PT "6 Clicks" Mobility  ?Outcome Measure Help needed turning from your back to your side while in a flat bed without using bedrails?: A Little ?Help needed moving from lying on your back to sitting on the side of a flat bed without using bedrails?: A Little ?Help needed moving to and from a bed to a chair (including a wheelchair)?: A Lot ?Help needed standing up from a chair using your arms (e.g., wheelchair or bedside chair)?: A Lot ?Help needed to walk in hospital room?: Total ?Help needed climbing 3-5 steps with a railing? : Total ?6 Click Score: 12 ? ?  ?End of Session   ?Activity Tolerance: Other (comment);Patient tolerated treatment well (Patient limited due to change of sacral dressing/bandage) ?Patient left: in bed;with call bell/phone within reach;with bed alarm set ?Nurse Communication: Other (comment) (change of bandages/dressing) ?PT Visit Diagnosis: History of falling (Z91.81);Muscle weakness (generalized) (M62.81) ?  ? ?Time: 4765-4650 ?PT Time Calculation (min) (ACUTE ONLY): 13 min ? ? ?  Charges:     ?  ?  ?   ? ?Jeralyn BennettAndrew Roshelle Traub, SPT ?07/18/2021, 11:13 AM ? ?

## 2021-07-18 NOTE — Progress Notes (Signed)
?Progress Note ? ? ?Patient: Jonathon York [id number] DOB: 11/16/1975 DOA: 07/09/2021     9 ?DOS: the patient was seen and examined on 07/18/2021 ?  ?Brief hospital course: ?46 y.o. male with medical history significant for paraplegia, neurogenic bladder, s/p suprapubic catheter, chronic severe sacral decubitus ulcers, chronic anemia, malnutrition, anxiety, UTI, chronic right hip dislocation.  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  He underwent debridement of the sacral wound at that time.  Pseudomonas aeruginosa was isolated from the wound.  He was subsequently discharged on IV meropenem, to [facility] where he stayed for  about 3 months.  He said he had a right IJ central line placed on 07/01/2021 and he was discharged home on 07/03/2021 from the [facility] hospital.  However, he said that he had not been taking the IV antibiotics because he did not have any output or and also he did not have any electricity or water at home.   ?  ?He had increasing pain in the hip and sacral wounds and noticed increasing drainage from the sacral wounds as well.  He was referred to the hospital for direct admission by Dr. Ola Spurr, his PCP and ID physician, for evaluation of  worsening sacral decubitus ulcer and bilateral hip osteomyelitis.  He has been following up with ID for his sacral ulcer but this has gotten worse. ?  ?  ?He was found to have Enterococcus bacteremia on this admission.  Dr. Windell Moment, general surgeon, was consulted but from his standpoint, there was no indication for debridement.  Dr. Sharlet Salina, orthopedic surgeon, was also consulted to assist with management.  He recommended referral to a tertiary center for further management because of the complexity of the case. ?  ?Assessment and Plan: ?No notes have been filed under  this hospital service. ?Service: Hospitalist ? ?Enterococcus faecalis bacteremia, ?Left hip osteomyelitis, right hip osteomyelitis with chronic right hip dislocation, chronic bony destruction of the lower sacrum and coccyx, stage IV sacral decubitus ulcers ?Blood culture from 07/09/2021 was positive for Enterococcus faecalis.  No growth on repeat blood culture.   ?Patient had TEE on 07/17/2021 and there was no evidence of vegetations/infective endocarditis. ?  ?Dr. Sharlet Salina, orthopedic surgeon, saw the patient on 07/11/2021 and recommended transfer to tertiary center for management of complex bilateral chronic hip osteomyelitis, chronic right hip dislocation and stage IV sacral decubitus ulcers.  Patient will follow-up with the [facility] wound care center to establish care with a plastic surgeon and orthopedic surgeon. ? ?See previous documentation. Dr. Mal Misty had attempted transfer to multiple tertiary centers in the area without success ?  ?  ?Leukopenia/neutropenia ?resolved ?  ?  ?Acute on chronic anemia, vitamin B12 deficiency ?H&H improved.  S/p transfusion with 1 unit of PRBCs on 07/14/2021.  S/p transfusion with 1 unit of PRBCs on 07/10/2021.  S/p 3 doses of IV iron sucrose. Cont on oral vitamin B12 ?  ?  ?Hypokalemia ?Improved.  Continue to monitor electrolytes ?  ?Paraplegia, neurogenic bladder ?Suprapubic catheter in place ? ?  ?Severe protein caloric malnutrition, vitamin D deficiency ?Started high-dose vitamin D on 07/12/2021 ?Continue nutritional supplements.   ?PEG was recommended by dietitian for supplementation nutrition ?Pt refuses PEG, states he was recommended in past and has consistently said no to peg ?  ?  ?Debility ?Consult PT and OT.  Patient used  to have good upper body strength but he has gotten weaker.  ?  ? ?  ? ?Subjective: Without complaints ? ?Physical Exam: ?Vitals:  ? 07/17/21 1532 07/17/21 2029 07/18/21 0342 07/18/21 0814  ?BP: 98/61 118/60 97/63 (!) 97/59  ?Pulse: 90 90 88 79  ?Resp: 18 16 20  16   ?Temp: 97.9 ?F (36.6 ?C) 98.1 ?F (36.7 ?C) 98.2 ?F (36.8 ?C) 98.4 ?F (36.9 ?C)  ?TempSrc:  Oral Oral Oral  ?SpO2: 97% 99% 99% 99%  ?Weight:      ?Height:      ? ?General exam: Awake, laying in bed, in nad ?Respiratory system: Normal respiratory effort, no wheezing ?Cardiovascular system: regular rate, s1, s2 ?Gastrointestinal system: Soft, nondistended, positive BS ?Central nervous system: CN2-12 grossly intact, strength intact ?Extremities: Perfused, no clubbing ?Skin: Normal skin turgor, no notable skin lesions seen ?Psychiatry: Mood normal // no visual hallucinations  ? ?Data Reviewed: ? ?Hgb 9.7 ? ?Family Communication: Pt in room, family not at bedside ? ?Disposition: ?Status is: Inpatient ?Remains inpatient appropriate because: Severity of illness and placement ? Planned Discharge Destination:  Unclear at this time ? ? ? ? ?Author: ?Marylu Lund, MD ?07/18/2021 1:31 PM ? ?For on call review [email or web address].  ?

## 2021-07-18 NOTE — Progress Notes (Signed)
Nurse went in to draw labs from patients IJ and was unsuccessful  , both lines flushed and with attempts to draw back blood no blood return noted. Patient told that phlebotomy may have to come and try to get blood from a vein and patient had no response. Nurse will place order for IV nurse to come check line prior to calling phlebotomist as patient has poor vascula access. ?

## 2021-07-18 NOTE — Progress Notes (Signed)
Nutrition Brief Note  ? 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 Pt reports that his allergy is only to oranges and that when he eats oranges he gets blisters around his mouth. Pt reports that he is able to eat lemons/limes and other citrus fruits. Pt has been unable to order some foods that he likes r/t his citrus allergy documentation. Per pt request, RD will remove the citrus allergy from Epic and add a note in Health Touch to not provide patient with anything that contains oranges. This will allow pt to be able to get the foods that he likes. Pt is aware of this change and agrees.  ? ?Betsey Holiday MS, RD, LDN ?Please refer to AMION for RD and/or RD on-call/weekend/after hours pager ? ?

## 2021-07-18 NOTE — Progress Notes (Signed)
Nutrition Follow Up Note  ? ?DOCUMENTATION CODES:  ? ?Non-severe (moderate) malnutrition in context of chronic illness ? ?INTERVENTION:  ? ?Ensure Enlive po TID, each supplement provides 350 kcal and 20 grams of protein. ? ?Magic cup TID with meals, each supplement provides 290 kcal and 9 grams of protein ? ?Snacks (8am, 2pm, 8pm) ? ?Vitamin B12 101mg po daily  ? ?Vitamin C 5075mpo BID ? ?Zinc 22055mo daily x 14 days  ? ?Vitamin D 50,000 units po weekly x 8 weeks  ? ?Vitamin E 400 units po daily  ? ?Vitamin A 10,000 units po daily  ? ?NUTRITION DIAGNOSIS:  ? ?Moderate Malnutrition related to chronic illness (non healing sacral wound, paraplegia) as evidenced by moderate fat depletion, moderate muscle depletion, severe muscle depletion. ? ?GOAL:  ? ?Patient will meet greater than or equal to 90% of their needs ?-progressing  ? ?MONITOR:  ? ?PO intake, Supplement acceptance, Labs, Weight trends, Skin, I & O's ? ?ASSESSMENT:  ? ?46 13o. male with medical history significant of paraplegia, neurogenic bladder, s/p suprapubic catheter, chronic severe sacral ulcer, anemia, malnutrition, anxiety, UTI and chronic right hip dislocation by CT scan on 06/25/2021 who presents with sacral ulcer with infection and E. faecalis bacteremia. ? ?Pt s/p TEE on 07/17/2021 and there was no evidence of vegetations/infective endocarditis. ? ?Met with pt in room today. Pt reports fair appetite and oral intake in hospital; pt is documented to be eating 20-50% of meals. Pt reports that he is drinking some of the Ensure supplements. Pt had an unopened Ensure Max on his side table at time of RD visit. Per nursing documentation, pt has been refusing a lot of the supplements. Pt reports that he is not drinking the Juven; RD will discontinue. RD recommended G-tube placement but pt refused. RD reiterated again to patient, the importance of adequate nutrition that is crucial for wound healing. Vitamins labs that are needed for wound healing were  checked, most of which returned low; vitamins are being supplemented. So far, pt is compliant with vitamin supplementation. Pt requesting snacks today to provide additional protein; RD will accommodate this request. No new weight since admit as pt is on a bed with no scale.  ? 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 Pt reports that his allergy is only to oranges and that when he eats oranges he gets blisters around his mouth. Pt reports that he is able to eat lemons/limes and other citrus fruits. Pt has been unable to order some foods that he likes r/t his citrus allergy documentation. Per pt request, RD will remove the citrus allergy from Epic and add a note in Health Touch to not provide patient with anything that contains oranges. This will allow pt to be able to get the foods that he likes. Pt is aware of this change and agrees.  ? ?Medications reviewed and include: vitamin C, B12, ocuvite, protonix, vitamin A, vitamin D, vitamin E, zinc, unasyn  ? ?Labs reviewed: K 3.8 wnl, P 4.1 wnl, Mg 2,1 wnl ?Copper 83, vitamin D 14.0(L), vitamin A 6.0(L), B12 162(L), vitamin C 0.2(L), vitamin E 7.8, zinc 41(L)- 3/15 ?Hgb 9.7(L), Hct 33.6(L), MCV 79.2(L), MCH 22.9(L), MCHC 28.9(L) ? ?Diet Order:   ?Diet Order   ? ?       ?  Diet regular Room service appropriate? Yes; Fluid consistency: Thin  Diet effective now       ?  ? ?  ?  ? ?  ? ?  EDUCATION NEEDS:  ? ?Education needs have been addressed ? ?Skin:  Skin Assessment: Reviewed RN Assessment (Stage 4 pressure injuries to the bilateral hips and sacrum with osteomyelitis and dislocation of hips.) ? ?Last BM:  3/22- type 1 ? ?Height:  ? ?Ht Readings from Last 1 Encounters:  ?07/10/21 _0  (1.778 m)  ? ? ?Weight:  ? ?Wt Readings from Last 1 Encounters:  ?07/09/21 47.9 kg  ? ? ?Ideal Body Weight:  75.4 kg ? ?BMI:  Body mass index is 15.15 kg/m?. ? ?Estimated Nutritional Needs:  ? ?Kcal:  1900-2200kcal/day ? ?Protein:  95-110g/day ? ?Fluid:  1.5-1.7L/day ? ?Koleen Distance MS, RD, LDN ?Please refer to AMION for RD and/or RD on-call/weekend/after hours pager ? ?

## 2021-07-18 NOTE — Progress Notes (Signed)
? ?Date of Admission:  07/09/2021    ? ?ID: Jonathon York is a 46 y.o. male  ?Principal Problem: ?  Enterococcus faecalis infection ?Active Problems: ?  Paraplegia ([place]) ?  Neurogenic bladder ?  Protein-calorie malnutrition, severe ?  Sacral decubitus ulcer, stage IV ([place]) ?  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[place]) ?  Microcytic anemia ?  GERD (gastroesophageal reflux disease) ?  Diarrhea ?  Chronic osteomyelitis involving pelvic region and thigh affecting right side ([place]) ?  Enterococcal bacteremia ? ?Patient known to me from last hospitalization.  He was hospitalized between 03/21/2021 until 04/02/2021 for stage IV sacral decubitus and hip ulcers with chronic osteomyelitis and septic arthritis of the hip joint on the right side on the left along with involvement of the femoral head.  He initially had Streptococcus anginosus bacteremia and was on Zosyn.  Blood cultures taken ?Wound showed Pseudomonas which was resistant to Zosyn and it was changed to meropenem.  [facility].  He [facility] ?He says his PICC line stopped working and he had central line placement. He was discharged home first week of March he says and asked  to continue Iv antibiotic- He never took the antibiotic as he had no help. HE went to see his PCP on 3/13 and he sent him to the ED. ?Blood culture positive for enterococcus fecalis 1/4 ?Pt is on unasyn ?HE did not want to remove the central line as he says he is a difficult access ?TEE done yesterday showed no endocarditis ?Repeat blood culture neg ?Subjective: ?Patient is stable ?States he is weak ? ? ?Medications:  ? vitamin C  500 mg Oral BID  ? Chlorhexidine Gluconate Cloth  6 each Topical Daily  ? hydrocortisone cream   Topical BID  ? multivitamin-lutein  1 capsule Oral Daily  ? nutrition supplement (JUVEN)  1 packet Oral BID BM  ? pantoprazole  40 mg Oral Q1200  ? Ensure Max Protein  11 oz Oral BID  ? sodium chloride flush  10-40 mL Intracatheter  Q12H  ? vitamin A  10,000 Units Oral Daily  ? vitamin B-12  1,000 mcg Oral Daily  ? Vitamin D (Ergocalciferol)  50,000 Units Oral Q7 days  ? vitamin E  400 Units Oral Daily  ? zinc sulfate  220 mg Oral Daily  ? ? ?Objective: ?Vital signs in last 24 hours: ?Temp:  [97.9 ?F (36.6 ?C)-98.4 ?F (36.9 ?C)] 98.4 ?F (36.9 ?C) (03/22 [id number]) ?Pulse Rate:  [79-90] 79 (03/22 0814) ?Resp:  [16-20] 16 (03/22 [id number]) ?BP: (97-118)/(59-63) 97/59 (03/22 [id number]) ?SpO2:  [97 %-99 %] 99 % (03/22 0814) ? ?PHYSICAL EXAM:  ?General: Alert, cooperative, chronically ill, pale, emaciated head: Normocephalic, without obvious abnormality, atraumatic. ?Eyes: Conjunctivae clear, anicteric sclerae. Pupils are equal ?ENT Nares normal. No drainage or sinus tenderness. ?Lips, mucosa, and tongue normal. No Thrush ?Neck: Supple, symmetrical, no adenopathy, thyroid: non tender ?no carotid bruit and no JVD. ?Back: No CVA tenderness. ?Lungs: Clear to auscultation bilaterally. No Wheezing or Rhonchi. No rales. ?Heart: Regular rate and rhythm, no murmur, rub or gallop. ?Abdomen: Soft, non-tender,not distended. Bowel sounds normal. No masses ?Supra pubic catheter ?Extremities: Wasted ?Skin: No rashes or lesions. Or bruising ?Sacrum and hip ? ? ? ? ? ? ?Lymph: Cervical, supraclavicular normal. ?Neurologic: Grossly non-focal ? ?Lab Results ?Recent Labs  ?  07/16/21 ?[id number] 07/18/21 ?0542  ?WBC 5.1 5.7  ?HGB 9.1* 9.7*  ?HCT 31.4* 33.6*  ?NA 139  139  ?K 3.9 3.8  ?CL 108 106  ?CO2 26 27  ?BUN 10 11  ?CREATININE 0.48* 0.65  ? ?Liver Panel ?No results for input(s): PROT, ALBUMIN, AST, ALT, ALKPHOS, BILITOT, BILIDIR, IBILI in the last 72 hours. ?Sedimentation Rate ?No results for input(s): ESRSEDRATE in the last 72 hours. ?C-Reactive Protein ?No results for input(s): CRP in the last 72 hours. ? ?Microbiology: ? ?Studies/Results: ?ECHO TEE ? ?Result Date: 07/17/2021 ?   TRANSESOPHOGEAL ECHO REPORT   Patient Name:   Jonathon York Date of Exam: 07/17/2021 Medical Rec #:   [id number]        Height:       70.0 in Accession #:    [id number]       Weight:       105.6 lb Date of Birth:  [birth year]        BSA:          1.591 m? Patient Age:    69 years         BP:           89/60 mmHg Patient Gender: M                HR:           88 bpm. Exam Location:  [facility] Procedure: Transesophageal Echo, Color Doppler and Cardiac Doppler Indications:     Not listed on TEE check-in sheet  History:         Patient has prior history of Echocardiogram examinations, most                  recent 07/10/2021. CHF, COPD and Stroke; Risk                  Factors:Hypertension.  Sonographer:     Sherrie Sport Referring Phys:  [place] Diagnosing Phys: Serafina Royals MD PROCEDURE: The transesophogeal probe was passed without difficulty through the esophogus of the patient. Sedation performed by performing physician. The patient developed no complications during the procedure. IMPRESSIONS  1. Left ventricular ejection fraction, by estimation, is 60 to 65%. The left ventricle has normal function. The left ventricle has no regional wall motion abnormalities.  2. Right ventricular systolic function is normal. The right ventricular size is normal.  3. No left atrial/left atrial appendage thrombus was detected.  4. The mitral valve is normal in structure. Trivial mitral valve regurgitation.  5. The aortic valve is normal in structure. Aortic valve regurgitation is not visualized.  6. Evidence of atrial level shunting detected by color flow Doppler. Agitated saline contrast bubble study was positive with shunting observed within 3-6 cardiac cycles suggestive of interatrial shunt. There is a small patent foramen ovale. FINDINGS  Left Ventricle: Left ventricular ejection fraction, by estimation, is 60 to 65%. The left ventricle has normal function. The left ventricle has no regional wall motion abnormalities. The left ventricular internal cavity size was small. Right Ventricle: The right ventricular size is  normal. No increase in right ventricular wall thickness. Right ventricular systolic function is normal. Left Atrium: Left atrial size was normal in size. No left atrial/left atrial appendage thrombus was detected. Right Atrium: Right atrial size was normal in size. Pericardium: There is no evidence of pericardial effusion. Mitral Valve: The mitral valve is normal in structure. Trivial mitral valve regurgitation. There is no evidence of mitral valve vegetation. Tricuspid Valve: The tricuspid valve is normal in structure. Tricuspid valve regurgitation is trivial. There is no evidence  of tricuspid valve vegetation. Aortic Valve: The aortic valve is normal in structure. Aortic valve regurgitation is not visualized. There is no evidence of aortic valve vegetation. Pulmonic Valve: The pulmonic valve was normal in structure. Pulmonic valve regurgitation is trivial. There is no evidence of pulmonic valve vegetation. Aorta: The aortic root and ascending aorta are structurally normal, with no evidence of dilitation. IAS/Shunts: The interatrial septum is aneurysmal. Evidence of atrial level shunting detected by color flow Doppler. Agitated saline contrast was given intravenously to evaluate for intracardiac shunting. Agitated saline contrast bubble study was positive  with shunting observed within 3-6 cardiac cycles suggestive of interatrial shunt. A small patent foramen ovale is detected. There is no evidence of an atrial septal defect. Serafina Royals MD Electronically signed by Serafina Royals MD Signature Date/Time: 07/17/2021/9:24:45 AM    Final    ? ? ?Assessment/Plan: ?Stage IV decubitus overlying the hips. ?Right hip chronic dislocation.  Chronic osteomyelitis of the right hip and septic arthritis. ?Left hip decubitus ulcer to the greater trochanter . ?History of Streptococcus anginosus bacteremia Pseudomonas infection and is being treated with more than 8 weeks of meropenem.  Last received antibiotics first week of  March. ? ?Enterococcus faecalis bacteremia.  1 out of 4. ?Still has a central line. ?TEE is negative. ?We will remove the central line and continue IV Unasyn for total of 10-14 days and then switch him over to p.o. A

## 2021-07-18 NOTE — Progress Notes (Signed)
IV team nurse is coming and will come place alteplase in line to clear for blood draw. She has notified nurse that this will need to sit in line for two hours before labs can be completed.  ?

## 2021-07-18 NOTE — Plan of Care (Signed)

## 2021-07-19 DIAGNOSIS — L89224 Pressure ulcer of left hip, stage 4: Secondary | ICD-10-CM

## 2021-07-19 DIAGNOSIS — S73001D Unspecified subluxation of right hip, subsequent encounter: Secondary | ICD-10-CM

## 2021-07-19 DIAGNOSIS — M00851 Arthritis due to other bacteria, right hip: Secondary | ICD-10-CM

## 2021-07-19 DIAGNOSIS — B952 Enterococcus as the cause of diseases classified elsewhere: Secondary | ICD-10-CM | POA: Diagnosis not present

## 2021-07-19 DIAGNOSIS — L89214 Pressure ulcer of right hip, stage 4: Secondary | ICD-10-CM

## 2021-07-19 DIAGNOSIS — Z9359 Other cystostomy status: Secondary | ICD-10-CM | POA: Diagnosis not present

## 2021-07-19 DIAGNOSIS — Z7189 Other specified counseling: Secondary | ICD-10-CM | POA: Diagnosis not present

## 2021-07-19 DIAGNOSIS — M86651 Other chronic osteomyelitis, right thigh: Secondary | ICD-10-CM | POA: Diagnosis not present

## 2021-07-19 NOTE — TOC Progression Note (Addendum)
Transition of Care (TOC) - Progression Note  ? ? ?Patient Details  ?Name: Jonathon York ?MRN: [id number] ?Date of Birth: 27-Feb-1976 ? ?Transition of Care (TOC) CM/SW Contact  ?Margarito Liner, LCSW ?Phone Number: ?07/19/2021, 9:15 AM ? ?Clinical Narrative:   No bed offers this morning. Sent therapy notes to try and trigger responses. ? ?3:56 pm: Faxed SNF referral to [facility]. Admissions coordinator said they don't have any beds but she sent referral to their sister facility [place] for them to review. This facility is on the [facility] Faxed referrals to [facility] (no long-term beds but will still review), [facility], [address] and [address], and [place]. Left voicemails at [facility], [place], [facility], [facility], [facility], [address] [place], [facility] [place], [facility], [facility], and [facility]. No beds at [facility] [place], [facility], [facility], [facility], [place], and [employer] at [facility]. ? ?Expected Discharge Plan and Services ?  ?  ?  ?  ?  ?                ?  ?  ?  ?  ?  ?  ?  ?  ?  ?  ? ? ?Social Determinants of Health (SDOH) Interventions ?  ? ?Readmission Risk Interventions ?   ? View : No data to display.  ?  ?  ?  ? ? ?

## 2021-07-19 NOTE — Progress Notes (Signed)
?Progress Note ? ? ?Patient: Jonathon York [id number] DOB: August 07, 1975 DOA: 07/09/2021     10 ?DOS: the patient was seen and examined on 07/19/2021 ?  ?Brief hospital course: ?46 y.o. male with medical history significant for paraplegia, neurogenic bladder, s/p suprapubic catheter, chronic severe sacral decubitus ulcers, chronic anemia, malnutrition, anxiety, UTI, chronic right hip dislocation.  He was discharged from [facility] on 04/02/2021 after hospitalization for septic shock, strep anginosus bacteremia, stage IV sacral decubitus ulcers and osteomyelitis of bilateral hips with involvement of the sacrum and coccyx.  He underwent debridement of the sacral wound at that time.  Pseudomonas aeruginosa was isolated from the wound.  He was subsequently discharged on IV meropenem, to [facility] where he stayed for  about 3 months.  He said he had a right IJ central line placed on 07/01/2021 and he was discharged home on 07/03/2021 from the [facility] hospital.  However, he said that he had not been taking the IV antibiotics because he did not have any output or and also he did not have any electricity or water at home.   ?  ?He had increasing pain in the hip and sacral wounds and noticed increasing drainage from the sacral wounds as well.  He was referred to the hospital for direct admission by Dr. Ola Spurr, his PCP and ID physician, for evaluation of  worsening sacral decubitus ulcer and bilateral hip osteomyelitis.  He has been following up with ID for his sacral ulcer but this has gotten worse. ?  ?  ?He was found to have Enterococcus bacteremia on this admission.  Dr. Windell Moment, general surgeon, was consulted but from his standpoint, there was no indication for debridement.  Dr. Sharlet Salina, orthopedic surgeon, was also consulted to assist with management.  He recommended referral to a tertiary center for further management because of the complexity of the case. ?  ?Assessment and Plan: ?No notes have been filed  under this hospital service. ?Service: Hospitalist ? ?Enterococcus faecalis bacteremia, ?Left hip osteomyelitis, right hip osteomyelitis with chronic right hip dislocation, chronic bony destruction of the lower sacrum and coccyx, stage IV sacral decubitus ulcers ?Blood culture from 07/09/2021 was positive for Enterococcus faecalis.  No growth on repeat blood culture.   ?Patient had TEE on 07/17/2021 and there was no evidence of vegetations/infective endocarditis. ?  ?Dr. Sharlet Salina, orthopedic surgeon, saw the patient on 07/11/2021 and recommended transfer to tertiary center for management of complex bilateral chronic hip osteomyelitis, chronic right hip dislocation and stage IV sacral decubitus ulcers.  Patient will follow-up with the [facility] wound care center to establish care with a plastic surgeon and orthopedic surgeon. ? ?See previous documentation. Dr. Mal Misty had made multiple attempts at transfer to tertiary centers in the area without success. Now plan on outpt f/u ?   ?Leukopenia/neutropenia ?resolved ?   ?Acute on chronic anemia, vitamin B12 deficiency ?H&H improved.  S/p transfusion with 1 unit of PRBCs on 07/14/2021.  S/p transfusion with 1 unit of PRBCs on 07/10/2021.  S/p 3 doses of IV iron sucrose. Cont on oral vitamin B12 ?Most recent hgb  stable at 9.7 ?   ?Hypokalemia ?Improved.  Continue to monitor electrolytes ?  ?Paraplegia, neurogenic bladder ?Suprapubic catheter in place ?  ?Severe protein caloric malnutrition, vitamin D deficiency ?Started high-dose vitamin D on 07/12/2021 ?PEG was recommended by dietitian for supplementation nutrition ?Pt refuses PEG, states he was recommended in past and has consistently said no to PEG ?Cont plan as per Dietitian  ?   ?  Debility ?Consult PT and OT.  Patient used to have good upper body strength but he has gotten weaker.  ?  ? ?  ? ?Subjective: Reports "trying" to eat meals ? ?Physical Exam: ?Vitals:  ? 07/18/21 1514 07/18/21 2136 07/19/21 0406 07/19/21 0855  ?BP:  105/64 100/62 96/62 (!) 90/55  ?Pulse: 91 79 87 76  ?Resp:  20 20   ?Temp:  98.9 ?F (37.2 ?C) 98.4 ?F (36.9 ?C) 98 ?F (36.7 ?C)  ?TempSrc:  Oral Oral Oral  ?SpO2:  99% 98% 99%  ?Weight:      ?Height:      ? ?General exam: Conversant, in no acute distress ?Respiratory system: normal chest rise, clear, no audible wheezing ?Cardiovascular system: regular rhythm, s1-s2 ?Gastrointestinal system: Nondistended, nontender, pos BS ?Central nervous system: No seizures, no tremors ?Extremities: No cyanosis, no joint deformities ?Skin: No rashes, no pallor ?Psychiatry: Affect normal // no auditory hallucinations  ? ?Data Reviewed: ? ?There are no new results to review at this time. ? ?Family Communication: Pt in room, family not at bedside ? ?Disposition: ?Status is: Inpatient ?Remains inpatient appropriate because: Severity of illness and placement ? Planned Discharge Destination:  Unclear at this time ? ? ? ? ?Author: ?Marylu Lund, MD ?07/19/2021 12:16 PM ? ?For on call review [email or web address].  ?

## 2021-07-19 NOTE — Progress Notes (Addendum)
? ?                                                                                                                                                     ?                                                   ?Daily Progress Note  ? ?Patient Name: Jonathon York       Date: 07/19/2021 ?DOB: November 02, 1975  Age: 46 y.o. MRN#: [id number] ?Attending Physician: Donne Hazel, MD ?Primary Care Physician: Leonel Ramsay, MD ?Admit Date: 07/09/2021 ? ?Reason for Consultation/Follow-up: Establishing goals of care ? ?Subjective: ?Notes and labs reviewed. In to see patient. He states he is really looking forward to going to rehab to work on upper body strength, and independence with transfers and moving as he had before the previous hospitalization. We discussed his status and updates since last visit.   ? ?--  He states a feeding tube has been mentioned. He states he will not allow one simply because the issue is not his inability or unwillingness to eat, it's that he can not get enough here to eat. He would like double trays. He is also amenable to Ensure.  ? ?-- We discussed dressing changes. He states he would like dressing changes twice per day, but states he would like the night shift change before midnight. He states he does not want dressing changes at 4 or 5 in the morning or in the middle of the night. Perhaps 7-12 on day shift and 7-12 on night shift. ? ? ?Length of Stay: 10 ? ?Current Medications: ?Scheduled Meds:  ? vitamin C  500 mg Oral BID  ? Chlorhexidine Gluconate Cloth  6 each Topical Daily  ? feeding supplement  237 mL Oral TID BM  ? hydrocortisone cream   Topical BID  ? multivitamin-lutein  1 capsule Oral Daily  ? pantoprazole  40 mg Oral Q1200  ? sodium chloride flush  10-40 mL Intracatheter Q12H  ? vitamin A  10,000 Units Oral Daily  ? vitamin B-12  1,000 mcg Oral Daily  ? Vitamin D (Ergocalciferol)  50,000 Units Oral Q7 days  ? vitamin E  400 Units Oral Daily  ? zinc sulfate  220 mg Oral Daily   ? ? ?Continuous Infusions: ? sodium chloride 10 mL/hr at 07/16/21 0502  ? ampicillin-sulbactam (UNASYN) IV 3 g (07/19/21 0524)  ? ? ?PRN Meds: ?sodium chloride, acetaminophen, diphenhydrAMINE, loperamide, morphine injection, ondansetron (ZOFRAN) IV, oxyCODONE-acetaminophen, sodium chloride flush, traZODone ? ?Physical Exam ?Pulmonary:  ?   Effort: Pulmonary effort is normal.  ?Neurological:  ?   Mental Status: He is  alert.  ?         ? ?Vital Signs: BP (!) 90/55 (BP Location: Right Arm)   Pulse 76   Temp 98 ?F (36.7 ?C) (Oral)   Resp 20   Ht 5\' 10"  (1.778 m)   Wt 47.9 kg   SpO2 99%   BMI 15.15 kg/m?  ?SpO2: SpO2: 99 % ?O2 Device: O2 Device: Room Air ?O2 Flow Rate: O2 Flow Rate (L/min): 2 L/min ? ?Intake/output summary:  ?Intake/Output Summary (Last 24 hours) at 07/19/2021 1144 ?Last data filed at 07/19/2021 0600 ?Gross per 24 hour  ?Intake 440 ml  ?Output 440 ml  ?Net 0 ml  ? ?LBM: Last BM Date : 07/18/21 ?Baseline Weight: Weight: 47.9 kg ?Most recent weight: Weight: 47.9 kg ? ? ?Patient Active Problem List  ? Diagnosis Date Noted  ? Chronic osteomyelitis involving pelvic region and thigh affecting right side (HCC)   ? Enterococcal bacteremia   ? Enterococcus faecalis infection 07/12/2021  ? Microcytic anemia 07/09/2021  ? GERD (gastroesophageal reflux disease) 07/09/2021  ? Diarrhea 07/09/2021  ? Acute on chronic anemia   ? AKI (acute kidney injury) (HCC)   ? Urinary retention 02/10/2021  ? Nausea and vomiting 08/29/2020  ? 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 Generalized abdominal pain   ? UTI (urinary tract infection) 12/02/2019  ? UTI (urinary tract infection) due to urinary indwelling catheter (HCC) 12/01/2019  ? Infected decubitus ulcer 10/27/2019  ? 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HCC) 10/27/2019  ? Bilateral leg edema 10/27/2019  ? 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 Constipation   ? Iron deficiency anemia   ? Sacral decubitus ulcer, stage IV (HCC) 07/13/2019  ? Intractable pain 11/26/2018  ? Anxiety 04/22/2018   ? Acute osteomyelitis of right foot (HCC) 03/18/2018  ? Pressure injury of skin 03/18/2018  ? Stage IV pressure ulcer of right buttock (HCC)   ? Protein-calorie malnutrition, severe 11/20/2016  ? Herpes simplex infection of penis   ? Decubitus ulcer 11/19/2016  ? 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 Decubitus ulcer of sacral region   ? Sepsis (HCC) 04/15/2016  ? 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 Pressure ulcer stage III 04/30/2015  ? Sterile pyuria 04/30/2015  ? Paraplegia (HCC) 04/30/2015  ? Neurogenic bladder 04/30/2015  ? Toe osteomyelitis, right (HCC) 04/28/2015  ? Moderate malnutrition (HCC) 04/28/2015  ? ? ?Palliative Care Assessment & Plan  ? ? ? ?Recommendations/Plan: ? ?Patient would like double meal trays, particularly meats. ? ?He would like dressing changes twice per day, but before midnight. Perhaps 7-12 on day shift and 7-12 on night shift. ? ? ? ? ?Code Status: ? ?  ?Code Status Orders  ?(From admission, onward)  ?  ? ? ?  ? ?  Start     Ordered  ? 07/09/21 1646  Full code  Continuous       ? 07/09/21 1646  ? ?  ?  ? ?  ? ?Code Status History   ? ? Date Active Date Inactive Code Status Order ID Comments User Context  ? 04/02/2021 1945 07/03/2021 1933 Full Code [id number]  Delsa Grana Inpatient  ? 03/21/2021 2240 04/02/2021 1650 Full Code [id number]  Judithe Modest, NP ED  ? 02/10/2021 1518 02/12/2021 2248 Full Code [id number]  Jonah Blue, MD ED  ? 08/29/2020 0117 09/02/2020 1811 Full Code [id number]  Andris Baumann, MD ED  ? 12/01/2019 0831 12/05/2019 2347 Full Code [id number]  Lucile Shutters, MD  ED  ? 10/27/2019 2117 10/28/2019 2137 Full Code [id number]  Ivor Costa, MD Inpatient  ? 07/13/2019 1627 07/16/2019 2003 Full Code [id number]  Wyvonnia Dusky, MD ED  ? 11/26/2018 0411 11/30/2018 2011 Full Code [id number]  Loletha Grayer, MD ED  ? 04/22/2018 2308 04/23/2018 1622 Full Code [id number]  Lance Coon, MD Inpatient  ? 03/18/2018  1119 03/23/2018 1659 Full Code [id number]  Henreitta Leber, MD ED  ? 02/14/2017 2321 02/19/2017 2046 Full Code [id number]  Lance Coon, MD Inpatient  ? 11/19/2016 1720 11/21/2016 2207 Full Code [id number]  Dustin Flock, MD Inpatient  ? 04/15/2016 1802 04/19/2016 0211 Full Code [id number]  Demetrios Loll, MD Inpatient  ? 04/29/2015 1017 04/30/2015 1339 Full Code [id number]  Sharlotte Alamo, MD Inpatient  ? 04/28/2015 0438 04/29/2015 1017 Full Code [id number]  Hillary Bow, MD ED  ? ?  ? ?35 min ? ?Asencion Gowda, NP ? ?Please contact Palliative Medicine Team phone at [phone] for questions and concerns.  ? ? ? ? ? ?

## 2021-07-19 NOTE — Progress Notes (Signed)
This RN went in to perform AM shift assessment. Asked patient if it would be okay to perform assessment. Patient stated "not really. This RN clarified/informed patient that it is important for staff to complete daily assessment of wounds and patient medical status as ordered.  ?Patient refused stating "no, I don't care."  ?This RN left the room without completing any care. ?  ?

## 2021-07-19 NOTE — Plan of Care (Signed)

## 2021-07-19 NOTE — Progress Notes (Signed)
Occupational Therapy Treatment ?Patient Details ?Name: Jonathon York ?MRN: [id number] ?DOB: 23-Jan-1976 ?Today's Date: 07/19/2021 ? ? ?History of present illness 46 y.o. male with medical history significant of paraplegia, neurogenic bladder, s/p for suprapubic catheter, chronic severe sacral ulcer, anemia, malnutrition, anxiety, UTI, chronic right hip dislocation by CT scan on 06/25/2021, who presents with sacral ulcer with infection. ?  ?OT comments ? Upon entering the room, pt in bed and agreeable to OT intervention. Pt provided paper handout of exercises to be utilized with red resistive theraband. OT reviewed and demonstrated bicep curls, chest pulls,elbow extension, alternating punches, shoulder elevation, and shoulder diagonals with pt returning demonstrations with min cuing for technique x 10 reps on each UE. Pt engaged in push ups with yoga blocks 2 set of 10 reps. Pt also performing push up and holding for count of 10 for 5 reps. Pt left with handout and theraband to continue exercises on his own. All needs within reach before therapist exits room. Pt benefiting from OT intervention with recommendation for SNF to address functional deficits. Pt is motivated.   ? ?Recommendations for follow up therapy are one component of a multi-disciplinary discharge planning process, led by the attending physician.  Recommendations may be updated based on patient status, additional functional criteria and insurance authorization. ?   ?Follow Up Recommendations ? Skilled nursing-short term rehab (<3 hours/day)  ?  ?Assistance Recommended at Discharge Frequent or constant Supervision/Assistance  ?Patient can return home with the following ? A lot of help with walking and/or transfers;A lot of help with bathing/dressing/bathroom;Assist for transportation;Help with stairs or ramp for entrance ?  ?Equipment Recommendations ? Other (comment) (defer to next venue of care)  ?  ?   ?Precautions / Restrictions  Precautions ?Precautions: Fall ?Precaution Comments: large sacral wound  ? ? ?  ? ?Mobility Bed Mobility ?Overal bed mobility: Modified Independent ?Bed Mobility: Rolling ?Rolling: Modified independent (Device/Increase time) ?  ?  ?  ?  ?General bed mobility comments: reliance on bed rails ?  ? ?Transfers ?  ?  ?  ?  ?  ?  ?  ?  ?  ?  ?  ?  ?Balance   ?Sitting-balance support: Feet unsupported, No upper extremity supported ?Sitting balance-Leahy Scale: Fair ?  ?  ?  ?  ?  ?  ?  ?  ?  ?  ?  ?  ?  ?  ?  ?  ?   ? ?ADL either performed or assessed with clinical judgement  ? ? ?Extremity/Trunk Assessment Upper Extremity Assessment ?Upper Extremity Assessment: Generalized weakness ?  ?  ?  ?  ?  ? ?Vision Patient Visual Report: No change from baseline ?  ?  ?   ?   ? ?Cognition Arousal/Alertness: Awake/alert ?Behavior During Therapy: [facility] for tasks assessed/performed ?Overall Cognitive Status: Within Functional Limits for tasks assessed ?  ?  ?  ?  ?  ?  ?  ?  ?  ?  ?  ?  ?  ?  ?  ?  ?  ?  ?  ?   ?   ?   ?   ? ? ?Pertinent Vitals/ Pain       Pain Assessment ?Pain Assessment: 0-10 ?Pain Score: 5  ?Pain Location: buttocks/sacrum ?Pain Descriptors / Indicators: Discomfort ?Pain Intervention(s): Monitored during session, Repositioned, Premedicated before session ? ?   ?   ? ?Frequency ? Min 2X/week  ? ? ? ? ?  ?  Progress Toward Goals ? ?OT Goals(current goals can now be found in the care plan section) ? Progress towards OT goals: Progressing toward goals ? ?Acute Rehab OT Goals ?Patient Stated Goal: to get stronger ?OT Goal Formulation: With patient ?Time For Goal Achievement: 07/31/21 ?Potential to Achieve Goals: Fair  ?Plan Discharge plan remains appropriate;Frequency remains appropriate   ? ?   ?AM-PAC OT "6 Clicks" Daily Activity     ?Outcome Measure ? ? Help from another person eating meals?: None ?Help from another person taking care of personal grooming?: None ?Help from another person toileting, which includes using  toliet, bedpan, or urinal?: A Lot ?Help from another person bathing (including washing, rinsing, drying)?: A Lot ?Help from another person to put on and taking off regular upper body clothing?: None ?Help from another person to put on and taking off regular lower body clothing?: A Lot ?6 Click Score: 18 ? ?  ?End of Session   ? ?OT Visit Diagnosis: Repeated falls (R29.6);Muscle weakness (generalized) (M62.81);History of falling (Z91.81) ?  ?Activity Tolerance Patient tolerated treatment well ?  ?Patient Left in bed;with call bell/phone within reach;with bed alarm set ?  ?Nurse Communication Mobility status ?  ? ?   ? ?Time: 4742-5956 ?OT Time Calculation (min): 23 min ? ?Charges: OT General Charges ?$OT Visit: 1 Visit ?OT Treatments ?$Therapeutic Exercise: 23-37 mins ? ?Jackquline Denmark, MS, OTR/L , CBIS ?ascom [phone]  ?07/19/21, 4:30 PM  ?

## 2021-07-19 NOTE — Progress Notes (Signed)
? ?Date of Admission:  07/09/2021    ? ?ID: Jonathon York is a 46 y.o. male  ?Principal Problem: ?  Enterococcus faecalis infection ?Active Problems: ?  Paraplegia ([place]) ?  Neurogenic bladder ?  Protein-calorie malnutrition, severe ?  Sacral decubitus ulcer, stage IV ([place]) ?  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[place]) ?  Microcytic anemia ?  GERD (gastroesophageal reflux disease) ?  Diarrhea ?  Chronic osteomyelitis involving pelvic region and thigh affecting right side ([place]) ?  Enterococcal bacteremia ? ?Patient known to me from last hospitalization.  He was hospitalized between 03/21/2021 until 04/02/2021 for stage IV sacral decubitus and hip ulcers with chronic osteomyelitis and septic arthritis of the hip joint on the right side on the left along with involvement of the femoral head.  He initially had Streptococcus anginosus bacteremia and was on Zosyn.  Blood cultures taken ?Wound showed Pseudomonas which was resistant to Zosyn and it was changed to meropenem.  [facility].  He had wound VAC [facility] at [facility] ?He says his PICC line stopped working and he had central line placement. He was discharged home first week of March he says and asked  to continue Iv antibiotic- He never took the antibiotic as he had no help. HE went to see his PCP on 3/13 and he sent him to the ED. ?Blood culture positive for enterococcus fecalis 1/4 ?Pt is on unasyn ?HE did not want to remove the central line as he says he is a difficult access ?TEE done yesterday showed no endocarditis ?Repeat blood culture neg ?Subjective: ?Says he is doing okay ?No pain ? ? ? ?Medications:  ? vitamin C  500 mg Oral BID  ? Chlorhexidine Gluconate Cloth  6 each Topical Daily  ? feeding supplement  237 mL Oral TID BM  ? hydrocortisone cream   Topical BID  ? multivitamin-lutein  1 capsule Oral Daily  ? pantoprazole  40 mg Oral Q1200  ? sodium chloride flush  10-40 mL Intracatheter Q12H  ? vitamin A  10,000 Units Oral Daily  ? vitamin  B-12  1,000 mcg Oral Daily  ? Vitamin D (Ergocalciferol)  50,000 Units Oral Q7 days  ? vitamin E  400 Units Oral Daily  ? zinc sulfate  220 mg Oral Daily  ? ? ?Objective: ?Vital signs in last 24 hours: ?Temp:  [98 ?F (36.7 ?C)-98.9 ?F (37.2 ?C)] 98 ?F (36.7 ?C) (03/23 [id number]) ?Pulse Rate:  [76-95] 76 (03/23 0855) ?Resp:  [17-20] 20 (03/23 0406) ?BP: (82-105)/(55-67) 90/55 (03/23 0855) ?SpO2:  [98 %-100 %] 99 % (03/23 0855) ? ?PHYSICAL EXAM:  ?General: Alert, cooperative, emaciated, pale,  ?head: Normocephalic, without obvious abnormality, atraumatic. ?Eyes: Conjunctivae clear, anicteric sclerae. Pupils are equal ?ENT Nares normal. No drainage or sinus tenderness. ?Lips, mucosa, and tongue normal. No Thrush ?Neck: Supple, symmetrical, no adenopathy, thyroid: non tender ?no carotid bruit and no JVD. ?Back: No CVA tenderness. ?Lungs: Clear to auscultation bilaterally. No Wheezing or Rhonchi. No rales. ?Heart: Regular rate and rhythm, no murmur, rub or gallop. ?Abdomen: Soft, non-tender,not distended. Bowel sounds normal. No masses ?Supra pubic catheter ?Extremities: Wasted ?Skin: No rashes or lesions. Or bruising ?Sacrum and hip ?Left hip ? ? ?Rt hip ? ? ?sacrum ? ? ? ? ? ? ? ?Lymph: Cervical, supraclavicular normal. ?Neurologic: Paraplegia ?Lab Results ?Recent Labs  ?  07/18/21 ?0542  ?WBC 5.7  ?HGB 9.7*  ?HCT 33.6*  ?NA 139  ?K 3.8  ?CL 106  ?CO2  27  ?BUN 11  ?CREATININE 0.65  ? ? ?Microbiology: ?07/09/2021.  Blood culture 1 out of 4 Enterococcus faecalis ?07/11/2021 blood cultures 2 out of 2 no growth ?Studies/Results: ?No results found. ? ? ?Assessment/Plan: ?Stage IV decubitus overlying the hips. ?Right hip chronic dislocation.  Chronic osteomyelitis of the right hip and septic arthritis. ?Left hip decubitus ulcer to the greater trochanter . ? ?History of Streptococcus anginosus bacteremia Pseudomonas infection and has been  treated with 12 weeks of meropenem.  Last received antibiotics March 6th ? ?Enterococcus  faecalis bacteremia.  1 out of 4. Source due to wound getting contaminted with stool ?He needs diverting colostomy ? ?Still has a central line. ?TEE is negative. ?On IV unasyn day 10 ?Can be switched to PO Augmentin tomorrow- and central line can be removed ?It is unfortunate that these ulcers are not going to heal completely and he will need [place] procedure by Ortho surgery/plastic surgery care at either [facility] or [facility] ? ?Paraplegia ? ?Anemia ? ?Hypoalbuminemia ? ?Neurogenic bladder with suprapubic catheter ? ?He will also need diverting ostomy for wound healing ?Discussed the management with the patient and hospitalist in  great detail. ?

## 2021-07-19 NOTE — Progress Notes (Signed)
When attempting to give AM medications at 1150 with Student Nurse, patient made disparaging comments stating "why would they have a rookie training a newbie?" I stated that I was an experienced nurse and he requested that he have a nurse with 20+ years of experience. I stated that I would be glad to have a new nurse assigned to his care.  ?When asked if patient would take his AM medications, he refused to take all medications except for the narcotic (oxycodone-acetaminophen 5/325). Made charge nurse Eulogio Ditch RN and [facility] unit assistant director Leontine Locket RN aware of above information.  ? ?Ranell Patrick assisted with patient's dressing change as patient agreeable to having him change dressings.  ? ?Ples Specter RN to assume care of this patient for remainder of this shift. Care turned over at this time. Handoff provided.  ?

## 2021-07-20 DIAGNOSIS — B952 Enterococcus as the cause of diseases classified elsewhere: Secondary | ICD-10-CM | POA: Diagnosis not present

## 2021-07-20 DIAGNOSIS — L89229 Pressure ulcer of left hip, unspecified stage: Secondary | ICD-10-CM

## 2021-07-20 DIAGNOSIS — E8809 Other disorders of plasma-protein metabolism, not elsewhere classified: Secondary | ICD-10-CM

## 2021-07-20 DIAGNOSIS — Z9359 Other cystostomy status: Secondary | ICD-10-CM | POA: Diagnosis not present

## 2021-07-20 DIAGNOSIS — G8220 Paraplegia, unspecified: Secondary | ICD-10-CM

## 2021-07-20 DIAGNOSIS — D649 Anemia, unspecified: Secondary | ICD-10-CM

## 2021-07-20 DIAGNOSIS — R7881 Bacteremia: Secondary | ICD-10-CM | POA: Diagnosis not present

## 2021-07-20 DIAGNOSIS — S73004A Unspecified dislocation of right hip, initial encounter: Secondary | ICD-10-CM

## 2021-07-20 MED ORDER — AMOXICILLIN-POT CLAVULANATE 875-125 MG PO TABS
1.0000 | ORAL_TABLET | Freq: Two times a day (BID) | ORAL | Status: DC
  Administered 2021-07-20 – 2021-07-28 (×14): 1 via ORAL
  Filled 2021-07-20 (×15): qty 1

## 2021-07-20 NOTE — TOC Progression Note (Signed)
Transition of Care (TOC) - Progression Note  ? ? ?Patient Details  ?Name: Jonathon York ?MRN: [id number] ?Date of Birth: [birth year] ? ?Transition of Care (TOC) CM/SW Contact  ?Margarito Liner, LCSW ?Phone Number: ?07/20/2021, 9:30 AM ? ?Clinical Narrative:   Faxed referral to [facility]. ? ?Expected Discharge Plan and Services ?  ?  ?  ?  ?  ?                ?  ?  ?  ?  ?  ?  ?  ?  ?  ?  ? ? ?Social Determinants of Health (SDOH) Interventions ?  ? ?Readmission Risk Interventions ?   ? View : No data to display.  ?  ?  ?  ? ? ?

## 2021-07-20 NOTE — Progress Notes (Signed)
? ?Date of Admission:  07/09/2021    ? ?ID: Jonathon York is a 46 y.o. male  ?Principal Problem: ?  Enterococcus faecalis infection ?Active Problems: ?  Paraplegia ([place]) ?  Neurogenic bladder ?  Protein-calorie malnutrition, severe ?  Sacral decubitus ulcer, stage IV ([place]) ?  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[place]) ?  Microcytic anemia ?  GERD (gastroesophageal reflux disease) ?  Diarrhea ?  Chronic osteomyelitis involving pelvic region and thigh affecting right side ([place]) ?  Enterococcal bacteremia ? ?Patient known to me from last hospitalization.  He was hospitalized between 03/21/2021 until 04/02/2021 for stage IV sacral decubitus and hip ulcers with chronic osteomyelitis and septic arthritis of the hip joint on the right side on the left along with involvement of the femoral head.  He initially had Streptococcus anginosus bacteremia and was on Zosyn.  Blood cultures taken ?Wound showed Pseudomonas which was resistant to Zosyn and it was changed to meropenem.  [facility].  He had wound VAC [facility] at [facility] ?He says his PICC line stopped working and he had central line placement. He was discharged home first week of March he says and asked  to continue Iv antibiotic- He never took the antibiotic as he had no help. HE went to see his PCP on 3/13 and he sent him to the ED. ?Blood culture positive for enterococcus fecalis 1/4 ?Pt is on unasyn ?HE did not want to remove the central line as he says he is a difficult access ?TEE done yesterday showed no endocarditis ?Repeat blood culture neg ?Subjective: ?Pt says amoxicillin causes nausea ?He has no fever ? ? ? ?Medications:  ? vitamin C  500 mg Oral BID  ? Chlorhexidine Gluconate Cloth  6 each Topical Daily  ? feeding supplement  237 mL Oral TID BM  ? multivitamin-lutein  1 capsule Oral Daily  ? pantoprazole  40 mg Oral Q1200  ? sodium chloride flush  10-40 mL Intracatheter Q12H  ? vitamin A  10,000 Units Oral Daily  ? vitamin B-12  1,000 mcg Oral  Daily  ? Vitamin D (Ergocalciferol)  50,000 Units Oral Q7 days  ? vitamin E  400 Units Oral Daily  ? zinc sulfate  220 mg Oral Daily  ? ? ?Objective: ?Vital signs in last 24 hours: ?Temp:  [98 ?F (36.7 ?C)-98.4 ?F (36.9 ?C)] 98 ?F (36.7 ?C) (03/24 0740) ?Pulse Rate:  [78-98] 78 (03/24 0740) ?Resp:  [16-18] 18 (03/24 0740) ?BP: (88-96)/(59-67) 88/59 (03/24 0740) ?SpO2:  [96 %-98 %] 96 % (03/24 0740) ? ?PHYSICAL EXAM:  ?General: Alert, cooperative, emaciated, pale,  ?Lungs: Clear to auscultation bilaterally. No Wheezing or Rhonchi. No rales. ?Heart: Regular rate and rhythm, no murmur, rub or gallop. ?Abdomen: Soft, non-tender,not distended. Bowel sounds normal. No masses ?Supra pubic catheter ?Extremities: Wasted ?Skin: No rashes or lesions. Or bruising ?Sacrum and hip ?Left hip ? ? ?Rt hip ? ? ?sacrum ? ? ? ? ? ? ? ?Lymph: Cervical, supraclavicular normal. ?Neurologic: Paraplegia ?Lab Results ?Recent Labs  ?  07/18/21 ?0542  ?WBC 5.7  ?HGB 9.7*  ?HCT 33.6*  ?NA 139  ?K 3.8  ?CL 106  ?CO2 27  ?BUN 11  ?CREATININE 0.65  ? ? ?Microbiology: ?07/09/2021.  Blood culture 1 out of 4 Enterococcus faecalis ?07/11/2021 blood cultures 2 out of 2 no growth ?Studies/Results: ?No results found. ? ? ?Assessment/Plan: ?Stage IV decubitus overlying the hips. ?Right hip chronic dislocation.  Chronic osteomyelitis of the right  hip and septic arthritis. ?Left hip decubitus ulcer to the greater trochanter . ? ? ?Enterococcus faecalis bacteremia.  1 out of 4. Source due to wound getting contaminted with stool ?He needs diverting colostomy ?Still has a central line. ?TEE is negative. ?On IV unasyn day 11 ?Will switch PO Augmentin and observe whether he tolerates it- to be given after breakfast and dinner and not on empty stomach- may use zofran if nauseous ?Will leave the line until we know that he tolerates oral antibiotic ? ? these ulcers are not going to heal completely and he will need [place] procedure by Ortho surgery/plastic  surgery  either at [facility] or [facility] ? ?History of Streptococcus anginosus bacteremia Pseudomonas infection and has been  treated with 12 weeks of meropenem.  Last received antibiotics March 6th ? ? ? ?Paraplegia ? ?Anemia ? ?Hypoalbuminemia ? ?Neurogenic bladder with suprapubic catheter ? ?Discussed the management with the patient . ID will follow him peripherally this weekend- call if needed ?

## 2021-07-20 NOTE — Progress Notes (Signed)
?Progress Note ? ? ?Patient: Jonathon York [id number] DOB: [birth year] DOA: 07/09/2021     11 ?DOS: the patient was seen and examined on 07/20/2021 ?  ?Brief hospital course: ?46 y.o. male with medical history significant for paraplegia, neurogenic bladder, s/p suprapubic catheter, chronic severe sacral decubitus ulcers, chronic anemia, malnutrition, anxiety, UTI, chronic right hip dislocation.  He was discharged from [facility] on 04/02/2021 after hospitalization for septic shock, strep anginosus bacteremia, stage IV sacral decubitus ulcers and osteomyelitis of bilateral hips with involvement of the sacrum and coccyx.  He underwent debridement of the sacral wound at that time.  Pseudomonas aeruginosa was isolated from the wound.  He was subsequently discharged on IV meropenem, to [facility] where he stayed for  about 3 months.  He said he had a right IJ central line placed on 07/01/2021 and he was discharged home on 07/03/2021 from the [facility] hospital.  However, he said that he had not been taking the IV antibiotics because he did not have any output or and also he did not have any electricity or water at home.   ?  ?He had increasing pain in the hip and sacral wounds and noticed increasing drainage from the sacral wounds as well.  He was referred to the hospital for direct admission by Dr. Ola Spurr, his PCP and ID physician, for evaluation of  worsening sacral decubitus ulcer and bilateral hip osteomyelitis.  He has been following up with ID for his sacral ulcer but this has gotten worse. ?  ?  ?He was found to have Enterococcus bacteremia on this admission.  Dr. Windell Moment, general surgeon, was consulted but from his standpoint, there was no indication for debridement.  Dr. Sharlet Salina, orthopedic surgeon, was also consulted to assist with management.  He recommended referral to a tertiary center for further management because of the complexity of the case. ?  ?Assessment and Plan: ?No notes have been filed  under this hospital service. ?Service: Hospitalist ? ?Enterococcus faecalis bacteremia, ?Left hip osteomyelitis, right hip osteomyelitis with chronic right hip dislocation, chronic bony destruction of the lower sacrum and coccyx, stage IV sacral decubitus ulcers ?Blood culture from 07/09/2021 was positive for Enterococcus faecalis.  No growth on repeat blood culture.   ?Patient had TEE on 07/17/2021 and there was no evidence of vegetations/infective endocarditis. ?  ?Dr. Sharlet Salina, orthopedic surgeon, saw the patient on 07/11/2021 and recommended transfer to tertiary center for management of complex bilateral chronic hip osteomyelitis, chronic right hip dislocation and stage IV sacral decubitus ulcers.  Patient will follow-up with the [facility] wound care center to establish care with a plastic surgeon and orthopedic surgeon. ? ?See previous documentation. Dr. Mal Misty had made multiple attempts at transfer to tertiary centers in the area without success. Now plan on outpt f/u ? ?Continue routine dressing changes. Chart reviewed, seems pt has been refusing changes overnight ? ?ID recs to change to augmentin and observe whether he tolerates ?   ?Leukopenia/neutropenia ?resolved ?   ?Acute on chronic anemia, vitamin B12 deficiency ?H&H improved.  S/p transfusion with 1 unit of PRBCs on 07/14/2021.  S/p transfusion with 1 unit of PRBCs on 07/10/2021.  S/p 3 doses of IV iron sucrose. Cont on oral vitamin B12 ?Most recent hgb  stable at 9.7 ?   ?Hypokalemia ?Improved.  Continue to monitor electrolytes ?  ?Paraplegia, neurogenic bladder ?Suprapubic catheter in place ?  ?Severe protein caloric malnutrition, vitamin D deficiency ?Started high-dose vitamin D on 07/12/2021 ?PEG was recommended by dietitian  for supplementation nutrition ?Pt refuses PEG, states he was recommended in past and has consistently said no to PEG ?Cont plan as per Dietitian  ?   ?Debility ?Consult PT and OT.  Patient used to have good upper body strength but he has  gotten weaker.  ?  ? ?  ? ?Subjective: Reports not doing dressing changes overnight because he was sleeping ? ?Physical Exam: ?Vitals:  ? 07/19/21 1948 07/20/21 0555 07/20/21 0740 07/20/21 1523  ?BP: 93/64 92/63 (!) 88/59 91/66  ?Pulse: 90 98 78 99  ?Resp: 16 16 18 18   ?Temp: 98.4 ?F (36.9 ?C) 98 ?F (36.7 ?C) 98 ?F (36.7 ?C) 98.2 ?F (36.8 ?C)  ?TempSrc: Oral Oral Oral Oral  ?SpO2: 96% 97% 96% 99%  ?Weight:      ?Height:      ? ?General exam: Awake, laying in bed, in nad ?Respiratory system: Normal respiratory effort, no wheezing ?Cardiovascular system: regular rate, s1, s2 ?Gastrointestinal system: Soft, nondistended, positive BS ?Central nervous system: CN2-12 grossly intact, strength intact ?Extremities: Perfused, no clubbing ?Skin: Normal skin turgor, no notable skin lesions seen ?Psychiatry: Mood normal // no visual hallucinations  ? ?Data Reviewed: ? ?There are no new results to review at this time. ? ?Family Communication: Pt in room, family not at bedside ? ?Disposition: ?Status is: Inpatient ?Remains inpatient appropriate because: Severity of illness and placement ? Planned Discharge Destination:  Unclear at this time ? ? ? ? ?Author: ?Marylu Lund, MD ?07/20/2021 3:49 PM ? ?For on call review [email or web address].  ?

## 2021-07-20 NOTE — Progress Notes (Signed)
Patient refused dressing change at 2000 during shift change, spoke to him about changing dressing in the morning so he could sleep but when I went to change the dressing this morning he told me no should have changed it at midnight.  When I went in at midnight he became upset and told me that he was sleeping. ?

## 2021-07-21 DIAGNOSIS — B952 Enterococcus as the cause of diseases classified elsewhere: Secondary | ICD-10-CM | POA: Diagnosis not present

## 2021-07-21 DIAGNOSIS — M86651 Other chronic osteomyelitis, right thigh: Secondary | ICD-10-CM | POA: Diagnosis not present

## 2021-07-21 DIAGNOSIS — Z9359 Other cystostomy status: Secondary | ICD-10-CM | POA: Diagnosis not present

## 2021-07-21 NOTE — Progress Notes (Signed)
?Progress Note ? ? ?Patient: Jonathon York [id number] DOB: 12/28/1975 DOA: 07/09/2021     12 ?DOS: the patient was seen and examined on 07/21/2021 ?  ?Brief hospital course: ?46 y.o. male with medical history significant for paraplegia, neurogenic bladder, s/p suprapubic catheter, chronic severe sacral decubitus ulcers, chronic anemia, malnutrition, anxiety, UTI, chronic right hip dislocation.  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  He underwent debridement of the sacral wound at that time.  Pseudomonas aeruginosa was isolated from the wound.  He was subsequently discharged on IV meropenem, to [facility] where he stayed for  about 3 months.  He said he had a right IJ central line placed on 07/01/2021 and he was discharged home on 07/03/2021 from the [facility] hospital.  However, he said that he had not been taking the IV antibiotics because he did not have any output or and also he did not have any electricity or water at home.   ?  ?He had increasing pain in the hip and sacral wounds and noticed increasing drainage from the sacral wounds as well.  He was referred to the hospital for direct admission by Dr. Ola Spurr, his PCP and ID physician, for evaluation of  worsening sacral decubitus ulcer and bilateral hip osteomyelitis.  He has been following up with ID for his sacral ulcer but this has gotten worse. ?  ?  ?He was found to have Enterococcus bacteremia on this admission.  Dr. Windell Moment, general surgeon, was consulted but from his standpoint, there was no indication for debridement.  Dr. Sharlet Salina, orthopedic surgeon, was also consulted to assist with management.  He recommended referral to a tertiary center for further management because of the complexity of the case. ?  ?Assessment and Plan: ?No notes have been filed  under this hospital service. ?Service: Hospitalist ? ?Enterococcus faecalis bacteremia, ?Left hip osteomyelitis, right hip osteomyelitis with chronic right hip dislocation, chronic bony destruction of the lower sacrum and coccyx, stage IV sacral decubitus ulcers ?Blood culture from 07/09/2021 was positive for Enterococcus faecalis.  No growth on repeat blood culture.   ?Patient had TEE on 07/17/2021 and there was no evidence of vegetations/infective endocarditis. ?  ?Dr. Sharlet Salina, orthopedic surgeon, saw the patient on 07/11/2021 and recommended transfer to tertiary center for management of complex bilateral chronic hip osteomyelitis, chronic right hip dislocation and stage IV sacral decubitus ulcers.  Patient will follow-up with the [facility] wound care center to establish care with a plastic surgeon and orthopedic surgeon. ? ?See previous documentation. Dr. Mal Misty had made multiple attempts at transfer to tertiary centers in the area without success. Now plan on outpt f/u ? ?Continue routine dressing changes. Chart reviewed, seems pt has been refusing changes overnight ? ?ID has changed to augmentin and observe whether he tolerates ? ?TOC following for placement ?   ?Leukopenia/neutropenia ?resolved ?   ?Acute on chronic anemia, vitamin B12 deficiency ?H&H improved.  S/p transfusion with 1 unit of PRBCs on 07/14/2021.  S/p transfusion with 1 unit of PRBCs on 07/10/2021.  S/p 3 doses of IV iron sucrose. Cont on oral vitamin B12 ?Most recent hgb  stable at 9.7 ?   ?Hypokalemia ?Improved.  Continue to monitor electrolytes ?  ?Paraplegia, neurogenic bladder ?Suprapubic catheter in place ?  ?Severe protein caloric malnutrition, vitamin D deficiency ?Started high-dose vitamin D on 07/12/2021 ?PEG  was recommended by dietitian for supplementation nutrition ?Pt refuses PEG, states he was recommended in past and has consistently said no to PEG ?Cont plan as per Dietitian  ?   ?Debility ?Consult PT and OT.  Patient used to have good  upper body strength but he has gotten weaker.  ? ?Placement pending, per TOC ?  ? ?  ? ?Subjective: No complaints today. Tolerated dressing changes yesterday ? ?Physical Exam: ?Vitals:  ? 07/20/21 1523 07/20/21 2032 07/21/21 [id number] 07/21/21 0829  ?BP: 91/66 93/61 94/66  92/70  ?Pulse: 99 87 93 99  ?Resp: 18 16 16 18   ?Temp: 98.2 ?F (36.8 ?C) 98.5 ?F (36.9 ?C) 97.7 ?F (36.5 ?C) 98.2 ?F (36.8 ?C)  ?TempSrc: Oral Oral Oral   ?SpO2: 99% 98% 97% 98%  ?Weight:      ?Height:      ? ?General exam: Conversant, in no acute distress ?Respiratory system: normal chest rise, clear, no audible wheezing ?Cardiovascular system: regular rhythm, s1-s2 ?Gastrointestinal system: Nondistended, nontender, pos BS ?Central nervous system: No seizures, no tremors ?Extremities: No cyanosis, no joint deformities ?Skin: No rashes, no pallor ?Psychiatry: Affect normal // no auditory hallucinations  ? ?Data Reviewed: ? ?There are no new results to review at this time. ? ?Family Communication: Pt in room, family not at bedside ? ?Disposition: ?Status is: Inpatient ?Remains inpatient appropriate because: Severity of illness and placement ? Planned Discharge Destination:  Unclear at this time ? ? ? ? ?Author: ?Marylu Lund, MD ?07/21/2021 2:50 PM ? ?For on call review [email or web address]  ?

## 2021-07-21 NOTE — Plan of Care (Signed)
?  Problem: Health Behavior/Discharge Planning: ?Goal: Ability to manage health-related needs will improve ?07/21/2021 2318 by Beaulah Dinning, RN ?Outcome: Progressing ?07/21/2021 2318 by Beaulah Dinning, RN ?Outcome: Progressing ?  ?Problem: Nutrition: ?Goal: Adequate nutrition will be maintained ?07/21/2021 2318 by Beaulah Dinning, RN ?Outcome: Progressing ?07/21/2021 2318 by Beaulah Dinning, RN ?Outcome: Progressing ?  ?Problem: Coping: ?Goal: Level of anxiety will decrease ?07/21/2021 2318 by Beaulah Dinning, RN ?Outcome: Progressing ?07/21/2021 2318 by Beaulah Dinning, RN ?Outcome: Progressing ?  ?Problem: Elimination: ?Goal: Will not experience complications related to bowel motility ?07/21/2021 2318 by Beaulah Dinning, RN ?Outcome: Progressing ?07/21/2021 2318 by Beaulah Dinning, RN ?Outcome: Progressing ?Goal: Will not experience complications related to urinary retention ?07/21/2021 2318 by Beaulah Dinning, RN ?Outcome: Progressing ?07/21/2021 2318 by Beaulah Dinning, RN ?Outcome: Progressing ?  ?Problem: Pain Managment: ?Goal: General experience of comfort will improve ?07/21/2021 2318 by Beaulah Dinning, RN ?Outcome: Progressing ?07/21/2021 2318 by Beaulah Dinning, RN ?Outcome: Progressing ?  ?Problem: Safety: ?Goal: Ability to remain free from injury will improve ?07/21/2021 2318 by Beaulah Dinning, RN ?Outcome: Progressing ?07/21/2021 2318 by Beaulah Dinning, RN ?Outcome: Progressing ?  ?Problem: Skin Integrity: ?Goal: Risk for impaired skin integrity will decrease ?07/21/2021 2318 by Beaulah Dinning, RN ?Outcome: Progressing ?07/21/2021 2318 by Beaulah Dinning, RN ?Outcome: Progressing ?  ?

## 2021-07-22 DIAGNOSIS — Z9359 Other cystostomy status: Secondary | ICD-10-CM | POA: Diagnosis not present

## 2021-07-22 DIAGNOSIS — M86651 Other chronic osteomyelitis, right thigh: Secondary | ICD-10-CM | POA: Diagnosis not present

## 2021-07-22 DIAGNOSIS — B952 Enterococcus as the cause of diseases classified elsewhere: Secondary | ICD-10-CM | POA: Diagnosis not present

## 2021-07-22 NOTE — Progress Notes (Signed)
?Progress Note ? ? ?Patient: Jonathon York [id number] DOB: 25-Feb-1976 DOA: 07/09/2021     13 ?DOS: the patient was seen and examined on 07/22/2021 ?  ?Brief hospital course: ?46 y.o. male with medical history significant for paraplegia, neurogenic bladder, s/p suprapubic catheter, chronic severe sacral decubitus ulcers, chronic anemia, malnutrition, anxiety, UTI, chronic right hip dislocation.  He was discharged from [facility] on 04/02/2021 after hospitalization for septic shock, strep anginosus bacteremia, stage IV sacral decubitus ulcers and osteomyelitis of bilateral hips with involvement of the sacrum and coccyx.  He underwent debridement of the sacral wound at that time.  Pseudomonas aeruginosa was isolated from the wound.  He was subsequently discharged on IV meropenem, to [facility] where he stayed for  about 3 months.  He said he had a right IJ central line placed on 07/01/2021 and he was discharged home on 07/03/2021 from the [facility] hospital.  However, he said that he had not been taking the IV antibiotics because he did not have any output or and also he did not have any electricity or water at home.   ?  ?He had increasing pain in the hip and sacral wounds and noticed increasing drainage from the sacral wounds as well.  He was referred to the hospital for direct admission by Dr. Ola Spurr, his PCP and ID physician, for evaluation of  worsening sacral decubitus ulcer and bilateral hip osteomyelitis.  He has been following up with ID for his sacral ulcer but this has gotten worse. ?  ?  ?He was found to have Enterococcus bacteremia on this admission.  Dr. Windell Moment, general surgeon, was consulted but from his standpoint, there was no indication for debridement.  Dr. Sharlet Salina, orthopedic surgeon, was also consulted to assist with management.  He recommended referral to a tertiary center for further management because of the complexity of the case. ?  ?Assessment and Plan: ?No notes have been filed  under this hospital service. ?Service: Hospitalist ? ?Enterococcus faecalis bacteremia, ?Left hip osteomyelitis, right hip osteomyelitis with chronic right hip dislocation, chronic bony destruction of the lower sacrum and coccyx, stage IV sacral decubitus ulcers ?Blood culture from 07/09/2021 was positive for Enterococcus faecalis.  No growth on repeat blood culture.   ?Patient had TEE on 07/17/2021 and there was no evidence of vegetations/infective endocarditis. ?  ?Dr. Sharlet Salina, orthopedic surgeon, saw the patient on 07/11/2021 and recommended transfer to tertiary center for management of complex bilateral chronic hip osteomyelitis, chronic right hip dislocation and stage IV sacral decubitus ulcers.  Patient will follow-up with the [facility] wound care center to establish care with a plastic surgeon and orthopedic surgeon. ? ?See previous documentation. Dr. Mal Misty had made multiple attempts at transfer to tertiary centers in the area without success. Now plan on outpt f/u ? ?Continue routine dressing changes. Chart reviewed, seems pt has been refusing changes overnight ? ?ID has changed to augmentin and observe whether he tolerates ? ?TOC cont to follow for placement ?   ?Leukopenia/neutropenia ?resolved ?   ?Acute on chronic anemia, vitamin B12 deficiency ?H&H improved.  S/p transfusion with 1 unit of PRBCs on 07/14/2021.  S/p transfusion with 1 unit of PRBCs on 07/10/2021.  S/p 3 doses of IV iron sucrose. Cont on oral vitamin B12 ?Most recent hgb  stable at 9.7 ?   ?Hypokalemia ?Improved.  Continue to monitor electrolytes ?  ?Paraplegia, neurogenic bladder ?Suprapubic catheter in place ?  ?Severe protein caloric malnutrition, vitamin D deficiency ?Started high-dose vitamin D on  07/12/2021 ?PEG was recommended by dietitian for supplementation nutrition ?Pt refuses PEG, states he was recommended in past and has consistently said no to PEG ?Cont plan as per Dietitian  ?   ?Debility ?Consult PT and OT.  Patient used to have  good upper body strength but he has gotten weaker.  ? ?Placement pending, per TOC ?  ? ?  ? ?Subjective: Without complaints today. Tolerating dressing changes ? ?Physical Exam: ?Vitals:  ? 07/21/21 1541 07/21/21 2028 07/22/21 0430 07/22/21 0817  ?BP: 90/60 95/71 94/62  93/65  ?Pulse: 98 99 83 93  ?Resp: 18 18 16 18   ?Temp: 98.5 ?F (36.9 ?C) 98.4 ?F (36.9 ?C) 98.2 ?F (36.8 ?C) 98.7 ?F (37.1 ?C)  ?TempSrc:   Oral   ?SpO2: 92% 98% 99% 97%  ?Weight:      ?Height:      ? ?General exam: Awake, laying in bed, in nad ?Respiratory system: Normal respiratory effort, no wheezing ?Cardiovascular system: regular rate, s1, s2 ?Gastrointestinal system: Soft, nondistended, positive BS ?Central nervous system: CN2-12 grossly intact, strength intact ?Extremities: Perfused, no clubbing ?Skin: Normal skin turgor, no notable skin lesions seen ?Psychiatry: Mood normal // no visual hallucinations  ? ?Data Reviewed: ? ?There are no new results to review at this time. ? ?Family Communication: Pt in room, family not at bedside ? ?Disposition: ?Status is: Inpatient ?Remains inpatient appropriate because: Severity of illness and placement ? Planned Discharge Destination:  Unclear at this time ? ? ? ? ?Author: ?Marylu Lund, MD ?07/22/2021 2:42 PM ? ?For on call review [email or web address]  ?

## 2021-07-23 ENCOUNTER — Inpatient Hospital Stay: Payer: Medicare HMO | Admitting: Radiology

## 2021-07-23 DIAGNOSIS — Z9359 Other cystostomy status: Secondary | ICD-10-CM | POA: Diagnosis not present

## 2021-07-23 DIAGNOSIS — B952 Enterococcus as the cause of diseases classified elsewhere: Secondary | ICD-10-CM | POA: Diagnosis not present

## 2021-07-23 DIAGNOSIS — M86651 Other chronic osteomyelitis, right thigh: Secondary | ICD-10-CM | POA: Diagnosis not present

## 2021-07-23 HISTORY — PX: IR REMOVAL TUN CV CATH W/O FL: IMG2289

## 2021-07-23 MED ORDER — LIDOCAINE HCL 1 % IJ SOLN
INTRAMUSCULAR | Status: AC
  Filled 2021-07-23: qty 20

## 2021-07-23 NOTE — Progress Notes (Addendum)
PT Cancellation Note ? ?Patient Details ?Name: Jonathon York ?MRN: [id number] ?DOB: 1976/03/20 ? ? ?Cancelled Treatment:    Reason Eval/Treat Not Completed: Other (comment) Per Pt reports, Pt is able to perform all transfers from bed to [facility] appropriately, safely, and at Pt's baseline for transfers. RN reports Pt is able to all bed mobility/transfers appropriately for wound care management without the need for extra assistance. Pt is able to perform functional transfer similar to his baseline, PT to SIGN OFF. ? ? ?Jeralyn Bennett, SPT ?07/23/2021, 4:22 PM ?

## 2021-07-23 NOTE — Procedures (Signed)
PROCEDURE SUMMARY: ? ?Successful removal of right sided tunneled central venous catheter. ?No immediate complications.  ?Pt tolerated well.  ? ?EBL N/A ? ?Pattricia Boss D PA-C ?07/23/2021 ?2:56 PM ? ? ? ?

## 2021-07-23 NOTE — Progress Notes (Signed)
? ?Date of Admission:  07/09/2021    ? ?ID: Jonathon York is a 46 y.o. male  ?Principal Problem: ?  Enterococcus faecalis infection ?Active Problems: ?  Paraplegia ([place]) ?  Neurogenic bladder ?  Protein-calorie malnutrition, severe ?  Sacral decubitus ulcer, stage IV ([place]) ?  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[place]) ?  Microcytic anemia ?  GERD (gastroesophageal reflux disease) ?  Diarrhea ?  Chronic osteomyelitis involving pelvic region and thigh affecting right side ([place]) ?  Enterococcal bacteremia ? ?Patient known to me from last hospitalization.  He was hospitalized between 03/21/2021 until 04/02/2021 for stage IV sacral decubitus and hip ulcers with chronic osteomyelitis and septic arthritis of the hip joint on the right side on the left along with involvement of the femoral head.  He initially had Streptococcus anginosus bacteremia and was on Zosyn.  Blood cultures taken ?Wound showed Pseudomonas which was resistant to Zosyn and it was changed to meropenem.  [facility].  He [facility] ?He says his PICC line stopped working and he had central line placement. He was discharged home first week of March he says and asked  to continue Iv antibiotic- He never took the antibiotic as he had no help. HE went to see his PCP on 3/13 and he sent him to the ED. ?Blood culture positive for enterococcus fecalis 1/4 ?Pt is on unasyn ?HE did not want to remove the central line as he says he is a difficult access ?TEE done yesterday showed no endocarditis ?Repeat blood culture neg ?Subjective: ?Tolerating amox/clav ?No nausea or vomiting or abdominal pain ?Central line removed today ? ? ? ?Medications:  ? amoxicillin-clavulanate  1 tablet Oral BID WC  ? vitamin C  500 mg Oral BID  ? Chlorhexidine Gluconate Cloth  6 each Topical Daily  ? feeding supplement  237 mL Oral TID BM  ? multivitamin-lutein  1 capsule Oral Daily  ? pantoprazole  40 mg Oral Q1200  ? sodium chloride flush  10-40 mL  Intracatheter Q12H  ? vitamin A  10,000 Units Oral Daily  ? vitamin B-12  1,000 mcg Oral Daily  ? Vitamin D (Ergocalciferol)  50,000 Units Oral Q7 days  ? vitamin E  400 Units Oral Daily  ? zinc sulfate  220 mg Oral Daily  ? ? ?Objective: ?Vital signs in last 24 hours: ?Temp:  [97.4 ?F (36.3 ?C)-98.2 ?F (36.8 ?C)] 98.2 ?F (36.8 ?C) (03/27 0800) ?Pulse Rate:  [85-96] 89 (03/27 0800) ?Resp:  [16-18] 16 (03/27 0520) ?BP: (86-96)/(56-66) 93/65 (03/27 0800) ?SpO2:  [96 %-100 %] 97 % (03/27 0800) ? ?PHYSICAL EXAM:  ?General: Alert, cooperative, emaciated, pale,  ?Lungs: Clear to auscultation bilaterally. No Wheezing or Rhonchi. No rales. ?Heart: Regular rate and rhythm, no murmur, rub or gallop. ?Abdomen: Soft, non-tender,not distended. Bowel sounds normal. No masses ?Supra pubic catheter ?Extremities: Wasted ?Skin: No rashes or lesions. Or bruising ?Sacrum and hip ?Left hip ? ? ?Rt hip ? ? ?sacrum ? ? ? ? ? ? ? ?Lymph: Cervical, supraclavicular normal. ?Neurologic: Paraplegia ?Lab Results ?No results for input(s): WBC, HGB, HCT, NA, K, CL, CO2, BUN, CREATININE, GLU in the last 72 hours. ? ?Invalid input(s): PLATELETS ? ? ?Microbiology: ?07/09/2021.  Blood culture 1 out of 4 Enterococcus faecalis ?07/11/2021 blood cultures 2 out of 2 no growth ?Studies/Results: ?No results found. ? ? ?Assessment/Plan: ?Stage IV decubitus overlying the hips. ?Right hip chronic dislocation.  Chronic osteomyelitis of thehip  bones ?Left hip decubitus ulcer to the greater trochanter . ? ? ?Enterococcus faecalis bacteremia.  1 out of 4. Source due to wound getting contaminted with stool ?He needs diverting colostomy ?TEE is negative. ?Got 11 days of unasyn and switched to PO augmentin indefintely till he sees the plastic surgeon/ortho at a tertiary center ? these ulcers are not going to heal completely and he will need [place] procedure by Ortho surgery/plastic surgery  either at [facility] or [facility] ? ?History of Streptococcus anginosus  bacteremia Pseudomonas infection and has been  treated with 12 weeks of meropenem.  Last received antibiotics March 6th ? ? ? ?Paraplegia ? ?Anemia ? ?Hypoalbuminemia ? ?Neurogenic bladder with suprapubic catheter ? ?Discussed the management with the patient .  ?

## 2021-07-23 NOTE — Progress Notes (Signed)
?Progress Note ? ? ?Patient: Jonathon York [id number] DOB: 12-18-1975 DOA: 07/09/2021     14 ?DOS: the patient was seen and examined on 07/23/2021 ?  ?Brief hospital course: ?46 y.o. male with medical history significant for paraplegia, neurogenic bladder, s/p suprapubic catheter, chronic severe sacral decubitus ulcers, chronic anemia, malnutrition, anxiety, UTI, chronic right hip dislocation.  He was discharged from [facility] on 04/02/2021 after hospitalization for septic shock, strep anginosus bacteremia, stage IV sacral decubitus ulcers and osteomyelitis of bilateral hips with involvement of the sacrum and coccyx.  He underwent debridement of the sacral wound at that time.  Pseudomonas aeruginosa was isolated from the wound.  He was subsequently discharged on IV meropenem, to [facility] where he stayed for  about 3 months.  He said he had a right IJ central line placed on 07/01/2021 and he was discharged home on 07/03/2021 from the [facility] hospital.  However, he said that he had not been taking the IV antibiotics because he did not have any output or and also he did not have any electricity or water at home.   ?  ?He had increasing pain in the hip and sacral wounds and noticed increasing drainage from the sacral wounds as well.  He was referred to the hospital for direct admission by Dr. Ola Spurr, his PCP and ID physician, for evaluation of  worsening sacral decubitus ulcer and bilateral hip osteomyelitis.  He has been following up with ID for his sacral ulcer but this has gotten worse. ?  ?  ?He was found to have Enterococcus bacteremia on this admission.  Dr. Windell Moment, general surgeon, was consulted but from his standpoint, there was no indication for debridement.  Dr. Sharlet Salina, orthopedic surgeon, was also consulted to assist with management.  He recommended referral to a tertiary center for further management because of the complexity of the case. ?  ?Assessment and Plan: ?No notes have been filed  under this hospital service. ?Service: Hospitalist ? ?Enterococcus faecalis bacteremia, ?Left hip osteomyelitis, right hip osteomyelitis with chronic right hip dislocation, chronic bony destruction of the lower sacrum and coccyx, stage IV sacral decubitus ulcers ?Blood culture from 07/09/2021 was positive for Enterococcus faecalis.  No growth on repeat blood culture.   ?Patient had TEE on 07/17/2021 and there was no evidence of vegetations/infective endocarditis. ?  ?Dr. Sharlet Salina, orthopedic surgeon, saw the patient on 07/11/2021 and recommended transfer to tertiary center for management of complex bilateral chronic hip osteomyelitis, chronic right hip dislocation and stage IV sacral decubitus ulcers.  Patient will follow-up with the [facility] wound care center to establish care with a plastic surgeon and orthopedic surgeon. ? ?See previous documentation. Dr. Mal Misty had made multiple attempts at transfer to tertiary centers in the area without success. Now plan on outpt f/u ? ?Continue routine dressing changes. Chart reviewed, seems pt has been refusing changes overnight ? ?ID has changed to augmentin and observe whether he tolerates ? ?Will d/c current PICC as pt no longer requires long term IV meds ? ?TOC cont to follow for placement ?   ?Leukopenia/neutropenia ?resolved ?   ?Acute on chronic anemia, vitamin B12 deficiency ?H&H improved.  S/p transfusion with 1 unit of PRBCs on 07/14/2021.  S/p transfusion with 1 unit of PRBCs on 07/10/2021.  S/p 3 doses of IV iron sucrose. Cont on oral vitamin B12 ?Most recent hgb  stable at 9.7 ?   ?Hypokalemia ?Improved.  Continue to monitor electrolytes ?  ?Paraplegia, neurogenic bladder ?Suprapubic catheter in place ?  ?  Severe protein caloric malnutrition, vitamin D deficiency ?Started high-dose vitamin D on 07/12/2021 ?PEG was recommended by dietitian for supplementation nutrition ?Pt refuses PEG, states he was recommended in past and has consistently said no to PEG ?Cont plan as per  Dietitian  ?   ?Debility ?Consult PT and OT.  Patient used to have good upper body strength but he has gotten weaker.  ? ?Placement pending, per TOC ?  ? ?  ? ?Subjective: No complaints. Asking about removing PICC which has been in place for at least 3 weeks ? ?Physical Exam: ?Vitals:  ? 07/22/21 1526 07/22/21 1932 07/23/21 0520 07/23/21 0800  ?BP: 95/66 (!) 86/56 96/65 93/65   ?Pulse: 96 90 85 89  ?Resp: 18 16 16    ?Temp: 98.2 ?F (36.8 ?C) (!) 97.4 ?F (36.3 ?C) 98 ?F (36.7 ?C) 98.2 ?F (36.8 ?C)  ?TempSrc:  Oral Oral   ?SpO2: 96% 100% 96% 97%  ?Weight:      ?Height:      ? ?General exam: Conversant, in no acute distress ?Respiratory system: normal chest rise, clear, no audible wheezing ?Cardiovascular system: regular rhythm, s1-s2 ?Gastrointestinal system: Nondistended, nontender, pos BS ?Central nervous system: No seizures, no tremors ?Extremities: No cyanosis, no joint deformities ?Skin: No rashes, no pallor ?Psychiatry: Affect normal // no auditory hallucinations  ? ?Data Reviewed: ? ?There are no new results to review at this time. ? ?Family Communication: Pt in room, family not at bedside ? ?Disposition: ?Status is: Inpatient ?Remains inpatient appropriate because: Severity of illness and placement ? Planned Discharge Destination:  Unclear at this time ? ? ? ? ?Author: ?Marylu Lund, MD ?07/23/2021 2:13 PM ? ?For on call review [email or web address]  ?

## 2021-07-24 DIAGNOSIS — Z9359 Other cystostomy status: Secondary | ICD-10-CM | POA: Diagnosis not present

## 2021-07-24 DIAGNOSIS — M86651 Other chronic osteomyelitis, right thigh: Secondary | ICD-10-CM | POA: Diagnosis not present

## 2021-07-24 DIAGNOSIS — B952 Enterococcus as the cause of diseases classified elsewhere: Secondary | ICD-10-CM | POA: Diagnosis not present

## 2021-07-24 NOTE — TOC Progression Note (Signed)
Transition of Care (TOC) - Progression Note  ? ? ?Patient Details  ?Name: Jonathon York ?MRN: [id number] ?Date of Birth: 13-Apr-1976 ? ?Transition of Care (TOC) CM/SW Contact  ?Beverly Sessions, RN ?Phone Number: ?07/24/2021, 1:23 PM ? ?Clinical Narrative:    ? ? ?Received return call from [place] at [place].  She states that referral had been denied but requests that it be refaxed to [phone] and she will have corporate review ? ?I have asked that one of my TOC coworkers to fax referral on my behalf  ? ?  ?  ? ?Expected Discharge Plan and Services ?  ?  ?  ?  ?  ?                ?  ?  ?  ?  ?  ?  ?  ?  ?  ?  ? ? ?Social Determinants of Health (SDOH) Interventions ?  ? ?Readmission Risk Interventions ?   ? View : No data to display.  ?  ?  ?  ? ? ?

## 2021-07-24 NOTE — Progress Notes (Signed)
?Progress Note ? ? ?Patient: Jonathon York [id number] DOB: 1976/03/03 DOA: 07/09/2021     15 ?DOS: the patient was seen and examined on 07/24/2021 ?  ?Brief hospital course: ?46 y.o. male with medical history significant for paraplegia, neurogenic bladder, s/p suprapubic catheter, chronic severe sacral decubitus ulcers, chronic anemia, malnutrition, anxiety, UTI, chronic right hip dislocation.  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  He underwent debridement of the sacral wound at that time.  Pseudomonas aeruginosa was isolated from the wound.  He was subsequently discharged on IV meropenem, to [facility] where he stayed for  about 3 months.  He said he had a right IJ central line placed on 07/01/2021 and he was discharged home on 07/03/2021 from the [facility] hospital.  However, he said that he had not been taking the IV antibiotics because he did not have any output or and also he did not have any electricity or water at home.   ?  ?He had increasing pain in the hip and sacral wounds and noticed increasing drainage from the sacral wounds as well.  He was referred to the hospital for direct admission by Dr. Ola Spurr, his PCP and ID physician, for evaluation of  worsening sacral decubitus ulcer and bilateral hip osteomyelitis.  He has been following up with ID for his sacral ulcer but this has gotten worse. ?  ?  ?He was found to have Enterococcus bacteremia on this admission.  Dr. Windell Moment, general surgeon, was consulted but from his standpoint, there was no indication for debridement.  Dr. Sharlet Salina, orthopedic surgeon, was also consulted to assist with management.  He recommended referral to a tertiary center for further management because of the complexity of the case. ?  ?Assessment and Plan: ?No notes have been filed  under this hospital service. ?Service: Hospitalist ? ?Enterococcus faecalis bacteremia, ?Left hip osteomyelitis, right hip osteomyelitis with chronic right hip dislocation, chronic bony destruction of the lower sacrum and coccyx, stage IV sacral decubitus ulcers ?Blood culture from 07/09/2021 was positive for Enterococcus faecalis.  No growth on repeat blood culture.   ?Patient had TEE on 07/17/2021 and there was no evidence of vegetations/infective endocarditis. ?  ?Dr. Sharlet Salina, orthopedic surgeon, saw the patient on 07/11/2021 and recommended transfer to tertiary center for management of complex bilateral chronic hip osteomyelitis, chronic right hip dislocation and stage IV sacral decubitus ulcers.  Patient will follow-up with the [facility] wound care center to establish care with a plastic surgeon and orthopedic surgeon. ? ?See previous documentation. Dr. Mal Misty had made multiple attempts at transfer to tertiary centers in the area without success. Now plan on outpt f/u with surgery ? ?Continue routine dressing changes. ? ?ID has changed to augmentin to be taken indefinitely until pt follows up with plastic surgery/ortho as outpatient ? ?Have d/c'd PICC 3/27 ? ?TOC cont to follow for placement ?   ?Leukopenia/neutropenia ?resolved ?   ?Acute on chronic anemia, vitamin B12 deficiency ?H&H improved.  S/p transfusion with 1 unit of PRBCs on 07/14/2021.  S/p transfusion with 1 unit of PRBCs on 07/10/2021.  S/p 3 doses of IV iron sucrose. Cont on oral vitamin B12 ?Most recent hgb  stable at 9.7 ?   ?Hypokalemia ?Improved.  Continue to monitor electrolytes ?  ?Paraplegia, neurogenic bladder ?Suprapubic catheter in place ?  ?Moderate protein caloric malnutrition, vitamin D  deficiency ?Started high-dose vitamin D on 07/12/2021 ?PEG was recommended by dietitian for supplementation nutrition ?Pt refuses PEG, states he was recommended in past and has consistently said no to PEG ?Cont plan as per Dietitian  ?   ?Debility ?Consult PT  and OT.  Patient used to have good upper body strength but he has gotten weaker.  ? ?Placement pending, per TOC ?  ? ?  ? ?Subjective: Without complaints this AM ? ?Physical Exam: ?Vitals:  ? 07/23/21 1526 07/23/21 1942 07/24/21 0519 07/24/21 0810  ?BP: 100/69 99/67 93/66  99/68  ?Pulse: 89 99 91 84  ?Resp:  16 18 16   ?Temp: 98.1 ?F (36.7 ?C) 98 ?F (36.7 ?C) 98 ?F (36.7 ?C) 98 ?F (36.7 ?C)  ?TempSrc:   Oral Oral  ?SpO2: 100% 99% 100% 97%  ?Weight:      ?Height:      ? ?General exam: Awake, laying in bed, in nad ?Respiratory system: Normal respiratory effort, no wheezing ?Cardiovascular system: regular rate, s1, s2 ?Gastrointestinal system: Soft, nondistended, positive BS ?Central nervous system: CN2-12 grossly intact, strength intact ?Extremities: Perfused, no clubbing ?Skin: Normal skin turgor, no rashes ?Psychiatry: Mood normal // no visual hallucinations  ? ?Data Reviewed: ? ?There are no new results to review at this time. ? ?Family Communication: Pt in room, family not at bedside ? ?Disposition: ?Status is: Inpatient ?Remains inpatient appropriate because: Severity of illness and placement ? Planned Discharge Destination:  Unclear at this time ? ? ? ? ?Author: ?Marylu Lund, MD ?07/24/2021 3:32 PM ? ?For on call review [email or web address]  ?

## 2021-07-24 NOTE — Progress Notes (Signed)
Occupational Therapy Treatment ?Patient Details ?Name: Jonathon York ?MRN: [id number] ?DOB: 03-03-1976 ?Today's Date: 07/24/2021 ? ? ?History of present illness 46 y.o. male with medical history significant of paraplegia, neurogenic bladder, s/p for suprapubic catheter, chronic severe sacral ulcer, anemia, malnutrition, anxiety, UTI, chronic right hip dislocation by CT scan on 06/25/2021, who presents with sacral ulcer with infection. ?  ?OT comments ? Upon entering the room, pt supine in bed with no c/o pain. OT asking pt how B UE HEP was going and pt reports, "fine". OT asking pt to demonstrate exercises which he replied, " Can't I just take my nap". OT discussed goals with pt in which he asked to address B UE strength. OT asking him what his goals are for therapy, if he wants to continue to work with therapy, and what he would like to address with OT. Pt reports, " I don't know". He continues to declined demonstration for exercise or any skills this session. OT did speak to RN who reports pt is able to transfer to [facility] and performs his own bowel program and bathing with set up A. Pt reports he does not need assist for functional transfers. Plan to see pt for one more session to address further and possibility discharge pt from OT intervention at that time.   ? ?Recommendations for follow up therapy are one component of a multi-disciplinary discharge planning process, led by the attending physician.  Recommendations may be updated based on patient status, additional functional criteria and insurance authorization. ?   ?Follow Up Recommendations ? Long-term institutional care without follow-up therapy  ?  ?Assistance Recommended at Discharge Frequent or constant Supervision/Assistance  ?Patient can return home with the following ? A lot of help with walking and/or transfers;A lot of help with bathing/dressing/bathroom;Assist for transportation;Help with stairs or ramp for entrance ?  ?Equipment Recommendations ?  Other (comment) (defer to next venue of care)  ?  ?   ?Precautions / Restrictions Precautions ?Precautions: Fall ?Precaution Comments: large sacral wound ?Restrictions ?Weight Bearing Restrictions: Yes  ? ? ?  ? ?   ?   ? ?ADL either performed or assessed with clinical judgement  ? ? ? ? ? ? ?Frequency ? Min 1X/week  ? ? ? ? ?  ?Progress Toward Goals ? ?OT Goals(current goals can now be found in the care plan section) ? Progress towards OT goals: OT to reassess next treatment ? ?Acute Rehab OT Goals ?Patient Stated Goal: to take a nap ?OT Goal Formulation: With patient ?Time For Goal Achievement: 07/31/21 ?Potential to Achieve Goals: Fair  ?Plan Frequency needs to be updated;Discharge plan needs to be updated   ? ?   ?AM-PAC OT "6 Clicks" Daily Activity     ?Outcome Measure ? ? Help from another person eating meals?: None ?Help from another person taking care of personal grooming?: None ?Help from another person toileting, which includes using toliet, bedpan, or urinal?: None ?Help from another person bathing (including washing, rinsing, drying)?: None ?Help from another person to put on and taking off regular upper body clothing?: None ?Help from another person to put on and taking off regular lower body clothing?: None ?6 Click Score: 24 ? ?  ?End of Session   ? ?OT Visit Diagnosis: Repeated falls (R29.6);Muscle weakness (generalized) (M62.81);History of falling (Z91.81) ?  ?Activity Tolerance Other (comment) (Pt is self limiting) ?  ?Patient Left in bed;with call bell/phone within reach;with bed alarm set ?  ?Nurse Communication Mobility status ?  ? ?   ? ?  Time: 2595-6387 ?OT Time Calculation (min): 10 min ? ?Charges: OT General Charges ?$OT Visit: 1 Visit ?OT Treatments ?$Therapeutic Activity: 8-22 mins ? ?Jackquline Denmark, MS, OTR/L , CBIS ?ascom [phone]  ?07/24/21, 4:40 PM  ?

## 2021-07-24 NOTE — TOC Progression Note (Signed)
Transition of Care (TOC) - Progression Note  ? ? ?Patient Details  ?Name: Jonathon York ?MRN: [id number] ?Date of Birth: Jul 01, 1975 ? ?Transition of Care (TOC) CM/SW Contact  ?Chapman Fitch, RN ?Phone Number: ?07/24/2021, 1:15 PM ? ?Clinical Narrative:    ? ? ?Message left for admission coordinator at [facility] to discuss referral  ?  ?  ? ?Expected Discharge Plan and Services ?  ?  ?  ?  ?  ?                ?  ?  ?  ?  ?  ?  ?  ?  ?  ?  ? ? ?Social Determinants of Health (SDOH) Interventions ?  ? ?Readmission Risk Interventions ?   ? View : No data to display.  ?  ?  ?  ? ? ?

## 2021-07-24 NOTE — Progress Notes (Addendum)
Nutrition Follow-up ? ?DOCUMENTATION CODES:  ? ?Non-severe (moderate) malnutrition in context of chronic illness ? ?INTERVENTION:  ? ?-Continue snacks between meals TID ?-Continue Magic cup TID with meals, each supplement provides 290 kcal and 9 grams of protein  ?-Continue Ensure Enlive po TID, each supplement provides 350 kcal and 20 grams of protein.  ?-Continue Vitamin B12 1051mcg po daily  ?-Continue Vitamin C 500mg  po BID ?-Continue Zinc 220mg  po daily x 14 days  ?-Continue vitamin D 50,000 units po weekly x 8 weeks  ?-Continue Vitamin E 400 units po daily  ?-Continue Vitamin A 10,000 units po daily  ?-Double protein portions with meals ? ?NUTRITION DIAGNOSIS:  ? ?Moderate Malnutrition related to chronic illness (non healing sacral wound, paraplegia) as evidenced by moderate fat depletion, moderate muscle depletion, severe muscle depletion. ? ?Ongoing  ? ?GOAL:  ? ?Patient will meet greater than or equal to 90% of their needs ? ?Progressing  ? ?MONITOR:  ? ?PO intake, Supplement acceptance, Labs, Weight trends, Skin, I & O's ? ?REASON FOR ASSESSMENT:  ? ?Consult ?Wound healing ? ?ASSESSMENT:  ? ?46 y.o. male with medical history significant of paraplegia, neurogenic bladder, s/p suprapubic catheter, chronic severe sacral ulcer, anemia, malnutrition, anxiety, UTI and chronic right hip dislocation by CT scan on 06/25/2021 who presents with sacral ulcer with infection and E. faecalis bacteremia. ? ?3/27- s/p removal of rt tunnelled central venous catheter ? ?Reviewed I/O's: -360 ml x 24 hours and -10.4 L since 07/10/21 ? ?Spoke with pt at bedside, who was hunched over watching videos on his phone when entering the room. Pt with flat affect and engaged minimally with this RD. Noted two Ensure supplements on table. When RD asked about taking his vitamins and supplements, he deflected questioning. He reports he has been trying to eat more off meals trays and is requesting double portions of protein (RD verified that  order was in place in [employer] meal ordering system). He was upset that he did not receive two pieces of pizza yesterday. Pt shares he will continue to consume protein off meal trays because "that's all I really eat". RD discussed importance of good meal and supplement intake to promote healing. Pt is inconsistent with supplements and vitamin acceptance.  ? ?Per MD notes, pt continues to refuse PEG. Given pt's malnutrition and increased nutritional needs to support wound healing (in addition to inconsistent efforts with supplements and vitamins), it will be difficult for pt to meet his nutritional needs without addition of supplemental nutrition.  ? ?TOC continues to work on placement.  ? ?Medications reviewed and include vitamin C, vitamin A, vitamin B-12, vitamin D, vitamin E, and zinc sulfate. ? ? ?Labs reviewed. ? ?Diet Order:   ?Diet Order   ? ?       ?  Diet regular Room service appropriate? Yes; Fluid consistency: Thin  Diet effective now       ?  ? ?  ?  ? ?  ? ? ?EDUCATION NEEDS:  ? ?Education needs have been addressed ? ?Skin:  Skin Assessment: Skin Integrity Issues: ?Skin Integrity Issues:: Stage III, Stage IV ?Stage III: sacrum ?Stage IV: rt ischial tuberosity ? ?Last BM:  07/23/21 ? ?Height:  ? ?Ht Readings from Last 1 Encounters:  ?07/10/21 5\' 10"  (1.778 m)  ? ? ?Weight:  ? ?Wt Readings from Last 1 Encounters:  ?07/09/21 47.9 kg  ? ? ?Ideal Body Weight:  75.4 kg ? ?BMI:  Body mass index is 15.15 kg/m?. ? ?Estimated  Nutritional Needs:  ? ?Kcal:  1900-2200kcal/day ? ?Protein:  95-110g/day ? ?Fluid:  1.5-1.7L/day ? ? ? ?Loistine Chance, RD, LDN, CDCES ?Registered Dietitian II ?Certified Diabetes Care and Education Specialist ?Please refer to [facility] for RD and/or RD on-call/weekend/after hours pager  ?

## 2021-07-25 NOTE — Progress Notes (Signed)
? ? ? ?Progress Note  ? ? ?Jonathon York  [id number] DOB: 1975-08-21  DOA: 07/09/2021 ?PCP: Leonel Ramsay, MD  ? ? ? ? ?Brief Narrative:  ? ? ?Medical records reviewed and are as summarized below: ? ?Jonathon York is a 46 y.o. male with medical history significant for paraplegia, neurogenic bladder, s/p suprapubic catheter, chronic severe sacral decubitus ulcers, chronic anemia, malnutrition, anxiety, UTI, chronic right hip dislocation.  He was discharged from [facility] on 04/02/2021 after hospitalization for septic shock, strep anginosus bacteremia, stage IV sacral decubitus ulcers and osteomyelitis of bilateral hips with involvement of the sacrum and coccyx.  He underwent debridement of the sacral wound at that time.  Pseudomonas aeruginosa was isolated from the wound.  He was subsequently discharged on IV meropenem, to [facility] where he stayed for  about 3 months.  He said he had a right IJ central line placed on 07/01/2021 and he was discharged home on 07/03/2021 from the [facility] hospital.  However, he said that he had not been taking the IV antibiotics because he did not have any help at home, and also he did not have any electricity or water at home.   ?  ?He had increasing pain in the hip and sacral wounds and noticed increasing drainage from the sacral wounds as well.  He was referred to the hospital for direct admission by Dr. Ola Spurr, his PCP and ID physician, for evaluation of  worsening sacral decubitus ulcer and bilateral hip osteomyelitis.  He has been following up with ID for his sacral ulcer but this has gotten worse. ?  ?  ?He was found to have Enterococcus bacteremia on this admission.  Dr. Windell Moment, general surgeon, was consulted but from his standpoint, there was no indication for debridement.  Dr. Sharlet Salina, orthopedic surgeon, was also consulted to assist with management.  He recommended referral to a tertiary center for further management because of the complexity of the  case. ?  ? ?  ? ? ? ?Assessment/Plan:  ? ?Principal Problem: ?  Enterococcus faecalis infection ?Active Problems: ?  Sacral decubitus ulcer, stage IV ([place]) ?  Paraplegia ([place]) ?  Neurogenic bladder ?  Protein-calorie malnutrition, severe ?  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[place]) ?  Microcytic anemia ?  GERD (gastroesophageal reflux disease) ?  Diarrhea ?  Chronic osteomyelitis involving pelvic region and thigh affecting right side ([place]) ?  Enterococcal bacteremia ? ? ?Nutrition Problem: Moderate Malnutrition ?Etiology: chronic illness (non healing sacral wound, paraplegia) ? ?Signs/Symptoms: moderate fat depletion, moderate muscle depletion, severe muscle depletion ? ? ?Body mass index is 15.15 kg/m?. ? ? ? ? ?Enterococcus faecalis bacteremia, ?Left hip osteomyelitis, right hip osteomyelitis with chronic right hip dislocation, chronic bony destruction of the lower sacrum and coccyx, stage IV sacral decubitus ulcers ?Blood culture from 07/09/2021 was positive for Enterococcus faecalis.  No growth on repeat blood culture.   ?Patient had TEE on 07/17/2021 and there was no evidence of vegetations/infective endocarditis. ?ID recommends continuation of Augmentin indefinitely. ?  ?Outpatient follow-up with [facility] or any tertiary center of his choice for further evaluation. ?  ?  ?Leukopenia/neutropenia ?Improved ?  ?  ?Acute on chronic anemia, vitamin B12 deficiency ?H&H improved.  S/p transfusion with 1 unit of PRBCs on 07/14/2021.  S/p transfusion with 1 unit of PRBCs on 07/10/2021.  S/p 3 doses of IV iron sucrose.  Continue oral vitamin B12.  Repeat CBC ?  ?  ?Hypokalemia ?Improved.  Repeat BMP  tomorrow ?  ?Paraplegia, neurogenic bladder ?Suprapubic catheter in place ? ?  ?Severe protein caloric malnutrition, vitamin D deficiency ?Started high-dose vitamin D on 07/12/2021 ?Continue nutritional supplements.  Follow-up with dietitian ?  ?  ?Debility ? ?  ? ? ? ? ? ? ?Diet Order   ? ?       ?  Diet regular Room  service appropriate? Yes; Fluid consistency: Thin  Diet effective now       ?  ? ?  ?  ? ?  ? ? ? ? ? ? ?Consultants: ?General surgeon ?Orthopedic surgeon ?Infectious disease ? ?Procedures: ?None ? ? ? ?Medications:  ? ? amoxicillin-clavulanate  1 tablet Oral BID WC  ? vitamin C  500 mg Oral BID  ? Chlorhexidine Gluconate Cloth  6 each Topical Daily  ? feeding supplement  237 mL Oral TID BM  ? multivitamin-lutein  1 capsule Oral Daily  ? pantoprazole  40 mg Oral Q1200  ? sodium chloride flush  10-40 mL Intracatheter Q12H  ? vitamin A  10,000 Units Oral Daily  ? vitamin B-12  1,000 mcg Oral Daily  ? Vitamin D (Ergocalciferol)  50,000 Units Oral Q7 days  ? vitamin E  400 Units Oral Daily  ? zinc sulfate  220 mg Oral Daily  ? ?Continuous Infusions: ? sodium chloride 10 mL/hr at 07/16/21 0502  ? ? ? ?Anti-infectives (From admission, onward)  ? ? Start     Dose/Rate Route Frequency Ordered Stop  ? 07/20/21 1700  amoxicillin-clavulanate (AUGMENTIN) 875-125 MG per tablet 1 tablet       ? 1 tablet Oral 2 times daily with meals 07/20/21 1602    ? 07/10/21 1800  vancomycin (VANCOREADY) IVPB 1250 mg/250 mL  Status:  Discontinued       ? 1,250 mg ?166.7 mL/hr over 90 Minutes Intravenous Every 24 hours 07/09/21 1747 07/10/21 1125  ? 07/10/21 1230  Ampicillin-Sulbactam (UNASYN) 3 g in sodium chloride 0.9 % 100 mL IVPB  Status:  Discontinued       ? 3 g ?200 mL/hr over 30 Minutes Intravenous Every 6 hours 07/10/21 1125 07/20/21 1602  ? 07/09/21 1900  meropenem (MERREM) 1 g in sodium chloride 0.9 % 100 mL IVPB  Status:  Discontinued       ? 1 g ?200 mL/hr over 30 Minutes Intravenous Every 8 hours 07/09/21 1747 07/10/21 1125  ? 07/09/21 1830  vancomycin (VANCOCIN) IVPB 1000 mg/200 mL premix       ? 1,000 mg ?200 mL/hr over 60 Minutes Intravenous  Once 07/09/21 1725 07/09/21 2214  ? ?  ? ? ? ? ? ? ? ? ? ?Family Communication/Anticipated D/C date and plan/Code Status  ? ?DVT prophylaxis: Place and maintain sequential compression  device Start: 07/10/21 0828 ? ? ?  Code Status: Full Code ? ?Family Communication: None ?Disposition Plan: Awaiting placement to SNF ? ? ?Status is: Inpatient ?Remains inpatient appropriate because: Awaiting placement to SNF ? ? ? ? ? ? ?Subjective:  ? ?Interval events noted.  He still has pain in the lower back from sacral wounds. ? ?Objective:  ? ? ?Vitals:  ? 07/24/21 1954 07/25/21 0445 07/25/21 0813 07/25/21 [id number]  ?BP: (!) 90/59 91/66 (!) 89/60 91/60  ?Pulse: 92 88 79 81  ?Resp: 18 17 16    ?Temp: 98.1 ?F (36.7 ?C) 98 ?F (36.7 ?C) 98.1 ?F (36.7 ?C)   ?TempSrc: Oral  Oral   ?SpO2: 99% 98% 100%   ?Weight:      ?  Height:      ? ?No data found. ? ? ?Intake/Output Summary (Last 24 hours) at 07/25/2021 1210 ?Last data filed at 07/25/2021 1038 ?Gross per 24 hour  ?Intake 240 ml  ?Output 700 ml  ?Net -460 ml  ? ?Filed Weights  ? 07/09/21 1646  ?Weight: 47.9 kg  ? ? ?Exam: ? ?GEN: NAD ?SKIN: Stage IV sacral decubitus ulcers ?EYES: No pallor or icterus ?ENT: MMM ?CV: RRR ?PULM: CTA B ?ABD: soft, ND, NT, +BS ?CNS: AAO x 3, paraplegic ?EXT: No edema or tenderness ? ? ? ?Data Reviewed:  ? ?I have personally reviewed following labs and imaging studies: ? ?Labs: ?Labs show the following:  ? ?Basic Metabolic Panel: ?No results for input(s): NA, K, CL, CO2, GLUCOSE, BUN, CREATININE, CALCIUM, MG, PHOS in the last 168 hours. ?GFR ?Estimated Creatinine Clearance: 78.2 mL/min (by C-G formula based on SCr of 0.65 mg/dL). ?Liver Function Tests: ?No results for input(s): AST, ALT, ALKPHOS, BILITOT, PROT, ALBUMIN in the last 168 hours. ?No results for input(s): LIPASE, AMYLASE in the last 168 hours. ?No results for input(s): AMMONIA in the last 168 hours. ?Coagulation profile ?No results for input(s): INR, PROTIME in the last 168 hours. ? ?CBC: ?No results for input(s): WBC, NEUTROABS, HGB, HCT, MCV, PLT in the last 168 hours. ?Cardiac Enzymes: ?No results for input(s): CKTOTAL, CKMB, CKMBINDEX, TROPONINI in the last 168 hours. ?BNP (last 3  results) ?No results for input(s): PROBNP in the last 8760 hours. ?CBG: ?No results for input(s): GLUCAP in the last 168 hours. ?D-Dimer: ?No results for input(s): DDIMER in the last 72 hours. ?Hgb A1c: ?No resul

## 2021-07-25 NOTE — TOC Progression Note (Signed)
Transition of Care (TOC) - Progression Note  ? ? ?Patient Details  ?Name: Jonathon York ?MRN: [id number] ?Date of Birth: [birth year] ? ?Transition of Care (TOC) CM/SW Contact  ?Candie Chroman, LCSW ?Phone Number: ?07/25/2021, 2:29 PM ? ?Clinical Narrative:  [facility] is unable to offer a bed. Faxed referral to [place] at [facility]. Discussed case with [facility] supervisor and asked her to add patient to difficult to place list.  ? ?Expected Discharge Plan and Services ?  ?  ?  ?  ?  ?                ?  ?  ?  ?  ?  ?  ?  ?  ?  ?  ? ? ?Social Determinants of Health (SDOH) Interventions ?  ? ?Readmission Risk Interventions ?   ? View : No data to display.  ?  ?  ?  ? ? ?

## 2021-07-26 LAB — COMPREHENSIVE METABOLIC PANEL
ALT: 12 U/L (ref 0–44)
AST: 14 U/L — ABNORMAL LOW (ref 15–41)
Albumin: 2.2 g/dL — ABNORMAL LOW (ref 3.5–5.0)
Alkaline Phosphatase: 80 U/L (ref 38–126)
Anion gap: 8 (ref 5–15)
BUN: 11 mg/dL (ref 6–20)
CO2: 25 mmol/L (ref 22–32)
Calcium: 8.4 mg/dL — ABNORMAL LOW (ref 8.9–10.3)
Chloride: 105 mmol/L (ref 98–111)
Creatinine, Ser: 0.47 mg/dL — ABNORMAL LOW (ref 0.61–1.24)
GFR, Estimated: >60 mL/min (ref >60)
Glucose, Bld: 95 mg/dL (ref 70–99)
Potassium: 3.8 mmol/L (ref 3.5–5.1)
Sodium: 138 mmol/L (ref 135–145)
Total Bilirubin: 0.4 mg/dL (ref 0.3–1.2)
Total Protein: 6.3 g/dL — ABNORMAL LOW (ref 6.5–8.1)

## 2021-07-26 LAB — CBC WITH DIFFERENTIAL/PLATELET
Abs Immature Granulocytes: 0.01 10*3/uL (ref 0.00–0.07)
Basophils Absolute: 0 10*3/uL (ref 0.0–0.1)
Basophils Relative: 1 %
Eosinophils Absolute: 0.1 10*3/uL (ref 0.0–0.5)
Eosinophils Relative: 2 %
HCT: 31.8 % — ABNORMAL LOW (ref 39.0–52.0)
Hemoglobin: 9.4 g/dL — ABNORMAL LOW (ref 13.0–17.0)
Immature Granulocytes: 0 %
Lymphocytes Relative: 31 %
Lymphs Abs: 1.6 10*3/uL (ref 0.7–4.0)
MCH: 23.4 pg — ABNORMAL LOW (ref 26.0–34.0)
MCHC: 29.6 g/dL — ABNORMAL LOW (ref 30.0–36.0)
MCV: 79.3 fL — ABNORMAL LOW (ref 80.0–100.0)
Monocytes Absolute: 0.4 10*3/uL (ref 0.1–1.0)
Monocytes Relative: 8 %
Neutro Abs: 2.9 10*3/uL (ref 1.7–7.7)
Neutrophils Relative %: 58 %
Platelets: 286 10*3/uL (ref 150–400)
RBC: 4.01 MIL/uL — ABNORMAL LOW (ref 4.22–5.81)
RDW: 19.7 % — ABNORMAL HIGH (ref 11.5–15.5)
WBC: 5 10*3/uL (ref 4.0–10.5)
nRBC: 0 % (ref 0.0–0.2)

## 2021-07-26 LAB — MAGNESIUM: Magnesium: 1.8 mg/dL (ref 1.7–2.4)

## 2021-07-26 LAB — PHOSPHORUS: Phosphorus: 3.8 mg/dL (ref 2.5–4.6)

## 2021-07-26 NOTE — Progress Notes (Signed)
Patient refused medicines this AM, as well as assessment. Patient only wanted his pain medicine, and to come back at 12:30 for dressing change.  ?

## 2021-07-26 NOTE — Progress Notes (Addendum)
? ? ? ?Progress Note  ? ? ?Jonathon York  [id number] DOB: [birth year]  DOA: 07/09/2021 ?PCP: Leonel Ramsay, MD  ? ? ? ? ?Brief Narrative:  ? ? ?Medical records reviewed and are as summarized below: ? ?Jonathon York is a 46 y.o. male with medical history significant for paraplegia, neurogenic bladder, s/p suprapubic catheter, chronic severe sacral decubitus ulcers, chronic anemia, malnutrition, anxiety, UTI, chronic right hip dislocation.  He was discharged from [facility] on 04/02/2021 after hospitalization for septic shock, strep anginosus bacteremia, stage IV sacral decubitus ulcers and osteomyelitis of bilateral hips with involvement of the sacrum and coccyx.  He underwent debridement of the sacral wound at that time.  Pseudomonas aeruginosa was isolated from the wound.  He was subsequently discharged on IV meropenem, to [facility] where he stayed for  about 3 months.  He had been treated with IV meropenem for about 3 months.  He said he had a right IJ central line placed on 07/01/2021 and he was discharged home on 07/03/2021 from the [facility] hospital.  However, he said that he had not been taking the IV antibiotics because he did not have any help at home, and also he did not have any electricity or water at home.   ?  ?He had increasing pain in the hip and sacral wounds and noticed increasing drainage from the sacral wounds as well.  He was referred to the hospital for direct admission by Dr. Ola Spurr, his PCP and ID physician, for evaluation of  worsening sacral decubitus ulcer and bilateral hip osteomyelitis.  He has been following up with ID for his sacral ulcer but this has gotten worse. ?  ?  ?He was found to have Enterococcus bacteremia on this admission.  Dr. Windell Moment, general surgeon, was consulted but from his standpoint, there was no indication for debridement.  Dr. Sharlet Salina, orthopedic surgeon, was also consulted to assist with management.  He recommended referral to a tertiary center  for further management because of the complexity of the case. ?  ? ?  ? ? ? ?Assessment/Plan:  ? ?Principal Problem: ?  Enterococcus faecalis infection ?Active Problems: ?  Sacral decubitus ulcer, stage IV ([place]) ?  Paraplegia ([place]) ?  Neurogenic bladder ?  Protein-calorie malnutrition, severe ?  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[place]) ?  Microcytic anemia ?  GERD (gastroesophageal reflux disease) ?  Diarrhea ?  Chronic osteomyelitis involving pelvic region and thigh affecting right side ([place]) ?  Enterococcal bacteremia ? ? ?Nutrition Problem: Moderate Malnutrition ?Etiology: chronic illness (non healing sacral wound, paraplegia) ? ?Signs/Symptoms: moderate fat depletion, moderate muscle depletion, severe muscle depletion ? ? ?Body mass index is 15.15 kg/m?. ? ? ? ? ?Enterococcus faecalis bacteremia, ?Left hip osteomyelitis, right hip osteomyelitis with chronic right hip dislocation, chronic bony destruction of the lower sacrum and coccyx, stage IV sacral decubitus ulcers ?Blood culture from 07/09/2021 was positive for Enterococcus faecalis.  No growth on repeat blood culture.   ?Patient had TEE on 07/17/2021 and there was no evidence of vegetations/infective endocarditis. ?He was initially treated with IV Unasyn for 11 days.  ID recommended continuation of Augmentin indefinitely until he sees plastic surgeon or orthopedic surgeon after discharge from the. ?  ?Outpatient follow-up with [facility] wound [facility] center or any tertiary center of his choice for further evaluation. ?  ?  ?Leukopenia/neutropenia ?Improved ?  ?  ?Acute on chronic anemia, vitamin B12 deficiency ?H&H improved.  S/p transfusion with 1 unit of PRBCs on 07/14/2021.  S/p transfusion with 1 unit of PRBCs on 07/10/2021.  S/p 3 doses of IV iron sucrose.  Continue oral vitamin B12.  Repeat CBC on 07/26/2021 shows that H&H is stable ?  ?  ?Hypokalemia ?Improved.  Repeat BMP tomorrow ?  ?Paraplegia, neurogenic bladder ?Suprapubic catheter in place ? ?  ?Severe protein  caloric malnutrition, vitamin D deficiency ?Started high-dose vitamin D on 07/12/2021 ?Continue nutritional supplements.  Follow-up with dietitian ?  ?  ?Debility ?PT recommends discharge to SNF. ?  ? ? ? ? ? ? ?Diet Order   ? ?       ?  Diet regular Room service appropriate? Yes; Fluid consistency: Thin  Diet effective now       ?  ? ?  ?  ? ?  ? ? ? ? ? ? ?Consultants: ?General surgeon ?Orthopedic surgeon ?Infectious disease ? ?Procedures: ?None ? ? ? ?Medications:  ? ? amoxicillin-clavulanate  1 tablet Oral BID WC  ? vitamin C  500 mg Oral BID  ? Chlorhexidine Gluconate Cloth  6 each Topical Daily  ? feeding supplement  237 mL Oral TID BM  ? multivitamin-lutein  1 capsule Oral Daily  ? pantoprazole  40 mg Oral Q1200  ? sodium chloride flush  10-40 mL Intracatheter Q12H  ? vitamin A  10,000 Units Oral Daily  ? vitamin B-12  1,000 mcg Oral Daily  ? Vitamin D (Ergocalciferol)  50,000 Units Oral Q7 days  ? vitamin E  400 Units Oral Daily  ? zinc sulfate  220 mg Oral Daily  ? ?Continuous Infusions: ? sodium chloride 10 mL/hr at 07/16/21 0502  ? ? ? ?Anti-infectives (From admission, onward)  ? ? Start     Dose/Rate Route Frequency Ordered Stop  ? 07/20/21 1700  amoxicillin-clavulanate (AUGMENTIN) 875-125 MG per tablet 1 tablet       ? 1 tablet Oral 2 times daily with meals 07/20/21 1602    ? 07/10/21 1800  vancomycin (VANCOREADY) IVPB 1250 mg/250 mL  Status:  Discontinued       ? 1,250 mg ?166.7 mL/hr over 90 Minutes Intravenous Every 24 hours 07/09/21 1747 07/10/21 1125  ? 07/10/21 1230  Ampicillin-Sulbactam (UNASYN) 3 g in sodium chloride 0.9 % 100 mL IVPB  Status:  Discontinued       ? 3 g ?200 mL/hr over 30 Minutes Intravenous Every 6 hours 07/10/21 1125 07/20/21 1602  ? 07/09/21 1900  meropenem (MERREM) 1 g in sodium chloride 0.9 % 100 mL IVPB  Status:  Discontinued       ? 1 g ?200 mL/hr over 30 Minutes Intravenous Every 8 hours 07/09/21 1747 07/10/21 1125  ? 07/09/21 1830  vancomycin (VANCOCIN) IVPB 1000 mg/200  mL premix       ? 1,000 mg ?200 mL/hr over 60 Minutes Intravenous  Once 07/09/21 1725 07/09/21 2214  ? ?  ? ? ? ? ? ? ? ? ? ?Family Communication/Anticipated D/C date and plan/Code Status  ? ?DVT prophylaxis: Place and maintain sequential compression device Start: 07/10/21 0828 ? ? ?  Code Status: Full Code ? ?Family Communication: None ?Disposition Plan: Awaiting placement to SNF ? ? ?Status is: Inpatient ?Remains inpatient appropriate because: Awaiting placement to SNF ? ? ? ? ? ? ?Subjective:  ? ?He has no complaints.  His nurse reported that he had been refusing some of his medications. ? ?Objective:  ? ? ?Vitals:  ? 07/25/21 0814 07/25/21 1520 07/25/21 2030 07/26/21 0750  ?BP: 91/60 96/66  102/70 96/63  ?Pulse: 81 88 84 90  ?Resp:  15 16   ?Temp:  98.4 ?F (36.9 ?C) 98 ?F (36.7 ?C) 98 ?F (36.7 ?C)  ?TempSrc:  Oral Oral   ?SpO2:  100% 99% 99%  ?Weight:      ?Height:      ? ?No data found. ? ? ?Intake/Output Summary (Last 24 hours) at 07/26/2021 1553 ?Last data filed at 07/26/2021 1100 ?Gross per 24 hour  ?Intake 0 ml  ?Output 875 ml  ?Net -875 ml  ? ?Filed Weights  ? 07/09/21 1646  ?Weight: 47.9 kg  ? ? ?Exam: ? ?GEN: NAD ?SKIN: Stage IV sacral decubitus ulcer ?EYES: EOMI ?ENT: MMM ?CV: RRR ?PULM: CTA B ?ABD: soft, ND, NT, +BS ?CNS: AAO x 3, non focal ?EXT: No edema or tenderness ? ? ? ? ? ?Data Reviewed:  ? ?I have personally reviewed following labs and imaging studies: ? ?Labs: ?Labs show the following:  ? ?Basic Metabolic Panel: ?Recent Labs  ?Lab 07/26/21 ?0356  ?NA 138  ?K 3.8  ?CL 105  ?CO2 25  ?GLUCOSE 95  ?BUN 11  ?CREATININE 0.47*  ?CALCIUM 8.4*  ?MG 1.8  ?PHOS 3.8  ? ?GFR ?Estimated Creatinine Clearance: 78.2 mL/min (A) (by C-G formula based on SCr of 0.47 mg/dL (L)). ?Liver Function Tests: ?Recent Labs  ?Lab 07/26/21 ?0356  ?AST 14*  ?ALT 12  ?ALKPHOS 80  ?BILITOT 0.4  ?PROT 6.3*  ?ALBUMIN 2.2*  ? ?No results for input(s): LIPASE, AMYLASE in the last 168 hours. ?No results for input(s): AMMONIA in the  last 168 hours. ?Coagulation profile ?No results for input(s): INR, PROTIME in the last 168 hours. ? ?CBC: ?Recent Labs  ?Lab 07/26/21 ?0356  ?WBC 5.0  ?NEUTROABS 2.9  ?HGB 9.4*  ?HCT 31.8*  ?MCV 79.3*  ?PLT

## 2021-07-26 NOTE — Progress Notes (Signed)
OT Cancellation Note ? ?Patient Details ?Name: GRAIG HESSLING ?MRN: [id number] ?DOB: 1975/06/09 ? ? ?Cancelled Treatment:    Reason Eval/Treat Not Completed: Other (comment). Pt visiting with family. Plans to re-attempt OT treatment tomorrow.  ? ?Jackquline Denmark, MS, OTR/L , CBIS ?ascom [phone]  ?07/26/21, 3:49 PM  ?

## 2021-07-26 NOTE — TOC Progression Note (Addendum)
Transition of Care (TOC) - Progression Note  ? ? ?Patient Details  ?Name: Jonathon York ?MRN: [id number] ?Date of Birth: 1975/05/26 ? ?Transition of Care (TOC) CM/SW Contact  ?Chapman Fitch, RN ?Phone Number: ?07/26/2021, 4:03 PM ? ?Clinical Narrative:    ? ? ? ?  ? Reached out to [facility] and confirmed they are not able to offer a bed ? ?- Resent referral to [facility] [place] to review again - they are still not able to offer a bed  ? ?- Requested Kevin with [address] and [place] to review  ? ?Expected Discharge Plan and Services ?  ?  ?  ?  ?  ?                ?  ?  ?  ?  ?  ?  ?  ?  ?  ?  ? ? ?Social Determinants of Health (SDOH) Interventions ?  ? ?Readmission Risk Interventions ?   ? View : No data to display.  ?  ?  ?  ? ? ?

## 2021-07-27 DIAGNOSIS — G8220 Paraplegia, unspecified: Secondary | ICD-10-CM

## 2021-07-27 DIAGNOSIS — D649 Anemia, unspecified: Secondary | ICD-10-CM

## 2021-07-27 DIAGNOSIS — B952 Enterococcus as the cause of diseases classified elsewhere: Secondary | ICD-10-CM | POA: Diagnosis not present

## 2021-07-27 DIAGNOSIS — E8809 Other disorders of plasma-protein metabolism, not elsewhere classified: Secondary | ICD-10-CM

## 2021-07-27 DIAGNOSIS — M86651 Other chronic osteomyelitis, right thigh: Secondary | ICD-10-CM | POA: Diagnosis not present

## 2021-07-27 DIAGNOSIS — R7881 Bacteremia: Secondary | ICD-10-CM | POA: Diagnosis not present

## 2021-07-27 DIAGNOSIS — L89224 Pressure ulcer of left hip, stage 4: Secondary | ICD-10-CM | POA: Diagnosis not present

## 2021-07-27 NOTE — Progress Notes (Signed)
Occupational Therapy Treatment ?Patient Details ?Name: Jonathon York ?MRN: [id number] ?DOB: 05/20/1975 ?Today's Date: 07/27/2021 ? ? ?History of present illness 46 y.o. male with medical history significant of paraplegia, neurogenic bladder, s/p for suprapubic catheter, chronic severe sacral ulcer, anemia, malnutrition, anxiety, UTI, chronic right hip dislocation by CT scan on 06/25/2021, who presents with sacral ulcer with infection. ?  ?OT comments ? Upon entering the room, pt supine in bed with no c/o pain and agreeable to OT intervention. Pt given an increased level of resistance band to continue B UE HEP. Pt is able to demonstrate various exercises from HEP without cuing for proper technique. He does admit that he has not been doing it regularly in room. OT encouraged pt that he had to do the exercises if his goal was to increase strength and with pt demonstrating correct exercises there is no longer skilled reason for therapist to see him. Pt verbalized understanding. Staff and pt reporting he is able to transfer himself to [facility] for bowel program and self care tasks without assistance. OT to SIGN OFF at this time.    ? ?Recommendations for follow up therapy are one component of a multi-disciplinary discharge planning process, led by the attending physician.  Recommendations may be updated based on patient status, additional functional criteria and insurance authorization. ?   ?Follow Up Recommendations ? Long-term institutional care without follow-up therapy  ?  ?Assistance Recommended at Discharge Frequent or constant Supervision/Assistance  ?Patient can return home with the following ? A lot of help with walking and/or transfers;A lot of help with bathing/dressing/bathroom;Assist for transportation;Help with stairs or ramp for entrance ?  ?Equipment Recommendations ? Other (comment) (Pt does need formal wheelchair evaluation)  ?  ?   ?Precautions / Restrictions Precautions ?Precautions: Fall ?Precaution  Comments: large sacral wound  ? ? ?  ? ?   ?   ? ?ADL either performed or assessed with clinical judgement  ? ? ?Extremity/Trunk Assessment Upper Extremity Assessment ?Upper Extremity Assessment: Generalized weakness ?  ?  ?  ?  ?  ? ?Vision Patient Visual Report: No change from baseline ?  ?  ?   ?   ? ?Cognition Arousal/Alertness: Awake/alert ?Behavior During Therapy: [facility] for tasks assessed/performed ?Overall Cognitive Status: Within Functional Limits for tasks assessed ?  ?  ?  ?  ?  ?  ?  ?  ?  ?  ?  ?  ?  ?  ?  ?  ?  ?  ?  ?   ?   ?   ?   ? ? ?Pertinent Vitals/ Pain       Pain Assessment ?Pain Assessment: No/denies pain ? ?   ?   ? ?Frequency ? Min 1X/week  ? ? ? ? ?  ?Progress Toward Goals ? ?OT Goals(current goals can now be found in the care plan section) ? Progress towards OT goals: Goals met and updated - see care plan;Goals met/education completed, patient discharged from OT ? ?Acute Rehab OT Goals ?OT Goal Formulation: With patient ?Time For Goal Achievement: 07/31/21 ?Potential to Achieve Goals: Fair  ?Plan All goals met and education completed, patient discharged from OT services   ? ?   ?AM-PAC OT "6 Clicks" Daily Activity     ?Outcome Measure ? ? Help from another person eating meals?: None ?Help from another person taking care of personal grooming?: None ?Help from another person toileting, which includes using toliet, bedpan, or urinal?: None ?Help from  another person bathing (including washing, rinsing, drying)?: None ?Help from another person to put on and taking off regular upper body clothing?: None ?Help from another person to put on and taking off regular lower body clothing?: None ?6 Click Score: 24 ? ?  ?End of Session   ? ?OT Visit Diagnosis: Repeated falls (R29.6);Muscle weakness (generalized) (M62.81);History of falling (Z91.81) ?  ?Activity Tolerance Patient tolerated treatment well ?  ?Patient Left in bed;with call bell/phone within reach;with bed alarm set ?  ?Nurse Communication   ?   ? ?   ? ?Time: 8882-8003 ?OT Time Calculation (min): 13 min ? ?Charges: OT General Charges ?$OT Visit: 1 Visit ?OT Treatments ?$Therapeutic Exercise: 8-22 mins ? ?Darleen Crocker, [place], OTR/L , CBIS ?ascom [phone]  ?07/27/21, 3:23 PM  ?

## 2021-07-27 NOTE — TOC Progression Note (Signed)
Transition of Care (TOC) - Progression Note  ? ? ?Patient Details  ?Name: Jonathon York ?MRN: [id number] ?Date of Birth: [birth year] ? ?Transition of Care (TOC) CM/SW Contact  ?Chapman Fitch, RN ?Phone Number: ?07/27/2021, 2:29 PM ? ?Clinical Narrative:    ?No bed offers ? ?Per ID ?[facility] to [facility] and they have given an appt for April 6th at 8am - [address] location with [employer]. has to be there at 7.30 am.  if he is still here- The other appt would be for April 11 at 1 pm if he is unable to keep this one but we need to tell them. thx [phone] ? ? ?  ?  ? ?Expected Discharge Plan and Services ?  ?  ?  ?  ?  ?                ?  ?  ?  ?  ?  ?  ?  ?  ?  ?  ? ? ?Social Determinants of Health (SDOH) Interventions ?  ? ?Readmission Risk Interventions ?   ? View : No data to display.  ?  ?  ?  ? ? ?

## 2021-07-27 NOTE — Progress Notes (Signed)
? ? ? ?Progress Note  ? ? ?TEJ KLINDT  [id number] DOB: April 21, 1976  DOA: 07/09/2021 ?PCP: Leonel Ramsay, MD  ? ? ? ? ?Brief Narrative:  ? ? ?Medical records reviewed and are as summarized below: ? ?Jonathon York is a 46 y.o. male with medical history significant for paraplegia, neurogenic bladder, s/p suprapubic catheter, chronic severe sacral decubitus ulcers, chronic anemia, malnutrition, anxiety, UTI, chronic right hip dislocation.  He was discharged from [facility] on 04/02/2021 after hospitalization for septic shock, strep anginosus bacteremia, stage IV sacral decubitus ulcers and osteomyelitis of bilateral hips with involvement of the sacrum and coccyx.  He underwent debridement of the sacral wound at that time.  Pseudomonas aeruginosa was isolated from the wound.  He was subsequently discharged on IV meropenem, to [facility] where he stayed for  about 3 months.  He had been treated with IV meropenem for about 3 months.  He said he had a right IJ central line placed on 07/01/2021 and he was discharged home on 07/03/2021 from the [facility] hospital.  However, he said that he had not been taking the IV antibiotics because he did not have any help at home, and also he did not have any electricity or water at home.   ?  ?He had increasing pain in the hip and sacral wounds and noticed increasing drainage from the sacral wounds as well.  He was referred to the hospital for direct admission by Dr. Ola Spurr, his PCP and ID physician, for evaluation of  worsening sacral decubitus ulcer and bilateral hip osteomyelitis.  He has been following up with ID for his sacral ulcer but this has gotten worse. ?  ?  ?He was found to have Enterococcus bacteremia on this admission.  Dr. Windell Moment, general surgeon, was consulted but from his standpoint, there was no indication for debridement.  Dr. Sharlet Salina, orthopedic surgeon, was also consulted to assist with management.  He recommended referral to a tertiary center  for further management because of the complexity of the case. ?  ? ?  ? ? ? ?Assessment/Plan:  ? ?Principal Problem: ?  Enterococcus faecalis infection ?Active Problems: ?  Sacral decubitus ulcer, stage IV ([place]) ?  Paraplegia ([place]) ?  Neurogenic bladder ?  Protein-calorie malnutrition, severe ?  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[place]) ?  Microcytic anemia ?  GERD (gastroesophageal reflux disease) ?  Diarrhea ?  Chronic osteomyelitis involving pelvic region and thigh affecting right side ([place]) ?  Enterococcal bacteremia ? ? ?Nutrition Problem: Moderate Malnutrition ?Etiology: chronic illness (non healing sacral wound, paraplegia) ? ?Signs/Symptoms: moderate fat depletion, moderate muscle depletion, severe muscle depletion ? ? ?Body mass index is 15.15 kg/m?. ? ? ? ? ?Enterococcus faecalis bacteremia, ?Left hip osteomyelitis, right hip osteomyelitis with chronic right hip dislocation, chronic bony destruction of the lower sacrum and coccyx, stage IV sacral decubitus ulcers ?Blood culture from 07/09/2021 was positive for Enterococcus faecalis.  No growth on repeat blood culture.   ?Patient had TEE on 07/17/2021 and there was no evidence of vegetations/infective endocarditis. ?He was initially treated with IV Unasyn for 11 days.  ID recommended continuation of Augmentin indefinitely until he sees plastic surgeon or orthopedic surgeon after discharge from the. ?  ?Outpatient follow-up with [facility] center or any tertiary center of his choice for further evaluation. ? ?Dr. Steva Ready, [place] specialist, was able to get him an appointment at the [facility] wound care clinic [phone]) on 08/02/2021 at 8 AM, with Hulen Shouts. There is  another appointment scheduled for August 07, 2021 at 1 PM  if he's unable to keep appt on 4/6.  ? ?  ?Leukopenia/neutropenia ?Improved ?  ?  ?Acute on chronic anemia, vitamin B12 deficiency ?H&H improved.  S/p transfusion with 1 unit of PRBCs on 07/14/2021.  S/p transfusion with 1 unit of PRBCs on  07/10/2021.  S/p 3 doses of IV iron sucrose.  Continue oral vitamin B12.  H&H is stable ?  ?  ?Hypokalemia ?Improved ?  ?Paraplegia, neurogenic bladder ?Suprapubic catheter in place ? ?  ?Severe protein caloric malnutrition, vitamin D deficiency ?Started high-dose vitamin D on 07/12/2021 ?Continue nutritional supplements.  Follow-up with dietitian ?  ?  ?Debility ?PT recommends discharge to SNF. ?  ? ? ? ? ? ? ?Diet Order   ? ?       ?  Diet regular Room service appropriate? Yes; Fluid consistency: Thin  Diet effective now       ?  ? ?  ?  ? ?  ? ? ? ? ? ? ?Consultants: ?General surgeon ?Orthopedic surgeon ?Infectious disease ? ?Procedures: ?None ? ? ? ?Medications:  ? ? amoxicillin-clavulanate  1 tablet Oral BID WC  ? vitamin C  500 mg Oral BID  ? feeding supplement  237 mL Oral TID BM  ? multivitamin-lutein  1 capsule Oral Daily  ? pantoprazole  40 mg Oral Q1200  ? sodium chloride flush  10-40 mL Intracatheter Q12H  ? vitamin A  10,000 Units Oral Daily  ? vitamin B-12  1,000 mcg Oral Daily  ? Vitamin D (Ergocalciferol)  50,000 Units Oral Q7 days  ? vitamin E  400 Units Oral Daily  ? ?Continuous Infusions: ? sodium chloride 10 mL/hr at 07/16/21 0502  ? ? ? ?Anti-infectives (From admission, onward)  ? ? Start     Dose/Rate Route Frequency Ordered Stop  ? 07/20/21 1700  amoxicillin-clavulanate (AUGMENTIN) 875-125 MG per tablet 1 tablet       ? 1 tablet Oral 2 times daily with meals 07/20/21 1602    ? 07/10/21 1800  vancomycin (VANCOREADY) IVPB 1250 mg/250 mL  Status:  Discontinued       ? 1,250 mg ?166.7 mL/hr over 90 Minutes Intravenous Every 24 hours 07/09/21 1747 07/10/21 1125  ? 07/10/21 1230  Ampicillin-Sulbactam (UNASYN) 3 g in sodium chloride 0.9 % 100 mL IVPB  Status:  Discontinued       ? 3 g ?200 mL/hr over 30 Minutes Intravenous Every 6 hours 07/10/21 1125 07/20/21 1602  ? 07/09/21 1900  meropenem (MERREM) 1 g in sodium chloride 0.9 % 100 mL IVPB  Status:  Discontinued       ? 1 g ?200 mL/hr over 30 Minutes  Intravenous Every 8 hours 07/09/21 1747 07/10/21 1125  ? 07/09/21 1830  vancomycin (VANCOCIN) IVPB 1000 mg/200 mL premix       ? 1,000 mg ?200 mL/hr over 60 Minutes Intravenous  Once 07/09/21 1725 07/09/21 2214  ? ?  ? ? ? ? ? ? ? ? ? ?Family Communication/Anticipated D/C date and plan/Code Status  ? ?DVT prophylaxis: Place and maintain sequential compression device Start: 07/10/21 0828 ? ? ?  Code Status: Full Code ? ?Family Communication: None ?Disposition Plan: Awaiting placement to SNF.  Difficult to place. ? ? ?Status is: Inpatient ?Remains inpatient appropriate because: Awaiting placement to SNF ? ? ? ? ? ? ?Subjective:  ? ?Interval events noted for he has no complaints.  He has been refusing  to take some of his medications. ? ?Objective:  ? ? ?Vitals:  ? 07/26/21 0750 07/26/21 2023 07/27/21 0424 07/27/21 1112  ?BP: 96/63 105/64 99/62 101/67  ?Pulse: 90 91 73 73  ?Resp:  20 20 18   ?Temp: 98 ?F (36.7 ?C) 98.4 ?F (36.9 ?C) (!) 97.4 ?F (36.3 ?C) (!) 97.5 ?F (36.4 ?C)  ?TempSrc:  Oral Oral Oral  ?SpO2: 99% 100% 99% 92%  ?Weight:      ?Height:      ? ?No data found. ? ? ?Intake/Output Summary (Last 24 hours) at 07/27/2021 1319 ?Last data filed at 07/27/2021 1017 ?Gross per 24 hour  ?Intake 360 ml  ?Output 500 ml  ?Net -140 ml  ? ?Filed Weights  ? 07/09/21 1646  ?Weight: 47.9 kg  ? ? ?Exam: ? ?GEN: NAD ?SKIN: Stage IV sacral decubitus ulcer ?EYES: No pallor or icterus ?ENT: MMM ?CV: RRR ?PULM: CTA B ?ABD: soft, ND, NT, +BS ?CNS: AAO x 3, paraplegic ?EXT: No edema or tenderness ? ? ? ? ? ?Data Reviewed:  ? ?I have personally reviewed following labs and imaging studies: ? ?Labs: ?Labs show the following:  ? ?Basic Metabolic Panel: ?Recent Labs  ?Lab 07/26/21 ?0356  ?NA 138  ?K 3.8  ?CL 105  ?CO2 25  ?GLUCOSE 95  ?BUN 11  ?CREATININE 0.47*  ?CALCIUM 8.4*  ?MG 1.8  ?PHOS 3.8  ? ?GFR ?Estimated Creatinine Clearance: 78.2 mL/min (A) (by C-G formula based on SCr of 0.47 mg/dL (L)). ?Liver Function Tests: ?Recent Labs  ?Lab  07/26/21 ?0356  ?AST 14*  ?ALT 12  ?ALKPHOS 80  ?BILITOT 0.4  ?PROT 6.3*  ?ALBUMIN 2.2*  ? ?No results for input(s): LIPASE, AMYLASE in the last 168 hours. ?No results for input(s): AMMONIA in the last 16

## 2021-07-27 NOTE — Progress Notes (Signed)
ID ?Pt doing okay ?No specific complaints ? ?O/e ?Patient Vitals for the past 24 hrs: ? BP Temp Temp src Pulse Resp SpO2  ?07/27/21 1112 101/67 (!) 97.5 ?F (36.4 ?C) Oral 73 18 92 %  ?07/27/21 0424 99/62 (!) 97.4 ?F (36.3 ?C) Oral 73 20 99 %  ?07/26/21 2023 105/64 98.4 ?F (36.9 ?C) Oral 91 20 100 %  ?  ?Pale ?Emaciated ?No distress ?Chest b/l air entry ?Hss1s2 ? ?Abd- suprapubic cath ?Hip and sacral wounds- does not look infected ? ? ? ? ? ? ? ? ? ?CNS- paraplegia ? ?Labs ? ?  Latest Ref Rng & Units 07/26/2021  ?  3:56 AM 07/18/2021  ?  5:42 AM 07/16/2021  ?  3:40 AM  ?CBC  ?WBC 4.0 - 10.5 K/uL 5.0   5.7   5.1    ?Hemoglobin 13.0 - 17.0 g/dL 9.4   9.7   9.1    ?Hematocrit 39.0 - 52.0 % 31.8   33.6   31.4    ?Platelets 150 - 400 K/uL 286   328   309    ?  ? ? ?  Latest Ref Rng & Units 07/26/2021  ?  3:56 AM 07/18/2021  ?  5:42 AM 07/16/2021  ?  3:40 AM  ?CMP  ?Glucose 70 - 99 mg/dL 95   95   94    ?BUN 6 - 20 mg/dL 11   11   10     ?Creatinine 0.61 - 1.24 mg/dL 0.47   0.65   0.48    ?Sodium 135 - 145 mmol/L 138   139   139    ?Potassium 3.5 - 5.1 mmol/L 3.8   3.8   3.9    ?Chloride 98 - 111 mmol/L 105   106   108    ?CO2 22 - 32 mmol/L 25   27   26     ?Calcium 8.9 - 10.3 mg/dL 8.4   8.1   8.1    ?Total Protein 6.5 - 8.1 g/dL 6.3      ?Total Bilirubin 0.3 - 1.2 mg/dL 0.4      ?Alkaline Phos 38 - 126 U/L 80      ?AST 15 - 41 U/L 14      ?ALT 0 - 44 U/L 12      ?  ?Micro ?07/09/21 ?BC- enterococcus bacteremia ?07/11/21- BC -NG ? ?Impression/recommendation ? ?Stage IV decubitus overlying the hips ? 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 [address]  location  with [employer] has to be there at 7.30 am. [phone]. Appt to be changed if he is not discharged by then ? ?Enterococcus bacteremia - completed Iv antibiotic ?Will be on augmentin until  the wounds are assessed by [facility] ?  ?Paraplegia ? ?Anemia ? ?Hypoalbuminemia ? ?Neurogenic bladder with suprapubic catheter ? ?Discussed the management with the patient and care team.  ?

## 2021-07-27 NOTE — Plan of Care (Signed)
?  Problem: Clinical Measurements: ?Goal: Will remain free from infection ?Outcome: Progressing ?  ?Problem: Nutrition: ?Goal: Adequate nutrition will be maintained ?Outcome: Progressing ?  ?Problem: Pain Managment: ?Goal: General experience of comfort will improve ?Outcome: Progressing ?  ?

## 2021-07-28 MED ORDER — CYANOCOBALAMIN 1000 MCG PO TABS: 1000.0000 ug | ORAL_TABLET | Freq: Every day | ORAL | Status: AC

## 2021-07-28 MED ORDER — AMOXICILLIN-POT CLAVULANATE 875-125 MG PO TABS: 1.0000 | ORAL_TABLET | Freq: Two times a day (BID) | ORAL | 0 refills | Status: AC

## 2021-07-28 MED ORDER — VITAMIN D (ERGOCALCIFEROL) 1.25 MG (50000 UNIT) PO CAPS
50000.0000 [IU] | ORAL_CAPSULE | ORAL | 0 refills | Status: AC
Start: 2021-08-02 — End: ?

## 2021-07-28 MED ORDER — OXYCODONE-ACETAMINOPHEN 5-325 MG PO TABS
1.0000 | ORAL_TABLET | Freq: Three times a day (TID) | ORAL | 0 refills | Status: AC | PRN
Start: 2021-07-28 — End: ?

## 2021-07-28 MED ORDER — ACETAMINOPHEN 325 MG PO TABS: 650.0000 mg | ORAL_TABLET | Freq: Three times a day (TID) | ORAL | Status: AC | PRN

## 2021-07-28 NOTE — TOC Transition Note (Signed)
Transition of Care (TOC) - CM/SW Discharge Note ? ? ?Patient Details  ?Name: Jonathon York ?MRN: [id number] ?Date of Birth: 08/20/1975 ? ?Transition of Care (TOC) CM/SW Contact:  ?Luvenia Redden, RN ?Phone Number: ?07/28/2021, 11:45 AM ? ? ?Clinical Narrative:    ?Spoke with pt on preference for [facility] services. Pt open to any agency for services. Contacted the following: [facility], [facility] and [facility]. Also spoke with [facility] and spoke with Willaim Rayas who will render services for nursing to the pt's. No other needs presented at this time. ? ?RN has alerted provider and bedside nurse that pt has been set up for [facility] service for wound care (nursing).  ? ?No other needs presented. ? ? ?Final next level of care: Home w Home Health Services ?Barriers to Discharge: Barriers Resolved ? ? ?Patient Goals and CMS Choice ?  ?  ?  ? ?Discharge Placement ?  ?           ?  ?  ?  ?Patient and family notified of of transfer: 07/28/21 (pt is aware) ? ?Discharge Plan and Services ?  ?  ?           ?  ?  ?  ?  ?  ?HH Arranged: RN ?HH Agency: [facility] ?Date HH Agency Contacted: 07/28/21 ?Time HH Agency Contacted: 1144 ?Representative spoke with at [facility] Agency: Willaim Rayas ? ?Social Determinants of Health (SDOH) Interventions ?  ? ? ?Readmission Risk Interventions ?   ? View : No data to display.  ?  ?  ?  ? ? ? ? ? ?

## 2021-07-28 NOTE — Discharge Summary (Signed)
? ?Physician Discharge Summary  ?Jonathon York [id number] DOB: 05-23-1975 DOA: 07/09/2021 ? ?PCP: Leonel Ramsay, MD ? ?Admit date: 07/09/2021 ?Discharge date: 07/28/2021 ? ?Discharge disposition: Home with home health care ? ? ?Recommendations for Outpatient Follow-Up:  ? ?Follow-up with PCP in 1 week ?Follow-up Hulen Shouts, NP, at [facility] wound care clinic. ? ? ?Discharge Diagnosis:  ? ?Principal Problem: ?  Enterococcus faecalis infection ?Active Problems: ?  Sacral decubitus ulcer, stage IV ([place]) ?  Paraplegia ([place]) ?  Neurogenic bladder ?  Protein-calorie malnutrition, severe ?  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[place]) ?  Microcytic anemia ?  GERD (gastroesophageal reflux disease) ?  Diarrhea ?  Chronic osteomyelitis involving pelvic region and thigh affecting right side ([place]) ?  Enterococcal bacteremia ? ? ? ?Discharge Condition: Stable. ? ?Diet recommendation:  ?Diet Order   ? ?       ?  Diet - low sodium heart healthy       ?  ?  Diet regular Room service appropriate? Yes; Fluid consistency: Thin  Diet effective now       ?  ? ?  ?  ? ?  ? ? ?  Code Status: Full Code ? ? ? ? ?Hospital Course:  ? ?Jonathon York is a 46 y.o. male with medical history significant for paraplegia, neurogenic bladder, s/p suprapubic catheter, chronic severe sacral decubitus ulcers, chronic anemia, malnutrition, anxiety, UTI, chronic right hip dislocation.  He was discharged from [facility] on 04/02/2021 after hospitalization for septic shock, strep anginosus bacteremia, stage IV sacral decubitus ulcers and osteomyelitis of bilateral hips with involvement of the sacrum and coccyx.  He underwent debridement of the sacral wound at that time.  Pseudomonas aeruginosa was isolated from the wound.  He was subsequently discharged on IV meropenem, to [facility] where he stayed for  about 3 months.  He had been treated with IV meropenem for about 3 months.  He said he had a right IJ central line placed on 07/01/2021 and he was  discharged home on 07/03/2021 from the [facility] hospital.  However, he said that he had not been taking the IV antibiotics because he did not have any help at home, and also he did not have any electricity or water at home.   ?  ?He had increasing pain in the hip and sacral wounds and noticed increasing drainage from the sacral wounds as well.  He was referred to the hospital for direct admission by Dr. Ola Spurr, his PCP and ID physician, for evaluation of  worsening sacral decubitus ulcer and bilateral hip osteomyelitis.  He has been following up with ID for his sacral ulcer but this has gotten worse. ?  ?  ?He was found to have Enterococcus bacteremia on this admission. Dr. Windell Moment, general surgeon, was consulted but from his standpoint, there was no indication for debridement.  Dr. Sharlet Salina, orthopedic surgeon, was also consulted to assist with management.  He recommended referral to a tertiary center for further management because of the complexity of the case. ?  ?  ?ASSESSMENT AND PLAN ? ?  ?Enterococcus faecalis bacteremia, ?Left hip osteomyelitis, right hip osteomyelitis with chronic right hip dislocation, chronic bony destruction of the lower sacrum and coccyx, stage IV sacral decubitus ulcers ?Blood culture from 07/09/2021 was positive for Enterococcus faecalis.  No growth on repeat blood culture.   ?Patient had TEE on 07/17/2021 and there was no evidence of vegetations/infective endocarditis. ?He was initially treated with IV Unasyn for 11 days.  ID recommended continuation of Augmentin indefinitely until he sees plastic surgeon or orthopedic surgeon. ?  ?Outpatient follow-up with [facility] wound care center on 08/02/2021. ?  ?  ?Leukopenia/neutropenia ?Improved ?  ?  ?Acute on chronic anemia, vitamin B12 deficiency ?H&H improved.  S/p transfusion with 1 unit of PRBCs on 07/14/2021.  S/p transfusion with 1 unit of PRBCs on 07/10/2021.  S/p 3 doses of IV iron sucrose.  Continue oral vitamin B12.  H&H is stable ?  ?   ?Hypokalemia ?Improved ?  ?Paraplegia, neurogenic bladder ?Suprapubic catheter in place ? ?  ?Severe protein caloric malnutrition, vitamin D deficiency ?Started high-dose vitamin D on 07/12/2021 ?Continue nutritional supplements.  ?  ? ? ?Overall, is medically stable.  Initially, he decided against going home and said going home was not an option.  Attempts were made to get him into SNF.  So far, this had been unsuccessful. He decided he wanted to go home after he was told he has an appointment at [facility].  I asked him about the availability of electricity and water supply at home.  He said that he has electricity and water at home.  He said his  problem before coming to the hospital was that he did not have anybody to help him administer the IV antibiotics that was prescribed at that time.  He said he would be okay at home.  He he said he still drives and he will be driving himself home.  He is deemed stable for discharge to home today. ? ? ? ?Medical Consultants:  ? ?Infectious disease ?General surgeon ?Orthopedic surgeon ?Palliative care ? ? ?Discharge Exam:  ? ? ?Vitals:  ? 07/27/21 0424 07/27/21 1112 07/27/21 1935 07/28/21 0509  ?BP: 99/62 101/67 (!) 95/59 105/71  ?Pulse: 73 73 86 91  ?Resp: 20 18 16 14   ?Temp: (!) 97.4 ?F (36.3 ?C) (!) 97.5 ?F (36.4 ?C) 98.9 ?F (37.2 ?C) 98.2 ?F (36.8 ?C)  ?TempSrc: Oral Oral Oral Oral  ?SpO2: 99% 92% 99% 99%  ?Weight:      ?Height:      ? ? ? ?GEN: NAD ?SKIN: Stage IV sacral decubitus ulcer ?EYES: No pallor or icterus ?ENT: MMM ?CV: RRR ?PULM: CTA B ?ABD: soft, ND, NT, +BS ?CNS: AAO x 3, paraplegic ?EXT: No edema or tenderness ? ? ?The results of significant diagnostics from this hospitalization (including imaging, microbiology, ancillary and laboratory) are listed below for reference.   ? ? ?Procedures and Diagnostic Studies:  ? ?ECHO TEE ? ?Result Date: 07/17/2021 ?   TRANSESOPHOGEAL ECHO REPORT   Patient Name:   Jonathon York Date of Exam: 07/17/2021  Medical Rec #:  [id number]        Height:       70.0 in Accession #:    [id number]       Weight:       105.6 lb Date of Birth:  23-Jul-1975        BSA:          1.591 m? Patient Age:    27 years         BP:           89/60 mmHg Patient Gender: M                HR:           88 bpm. Exam Location:  [facility] Procedure: Transesophageal Echo, Color Doppler and Cardiac Doppler Indications:     Not  listed on TEE check-in sheet  History:         Patient has prior history of Echocardiogram examinations, most                  recent 07/10/2021. CHF, COPD and Stroke; Risk                  Factors:Hypertension.  Sonographer:     Sherrie Sport Referring Phys:  [place] Diagnosing Phys: Serafina Royals MD PROCEDURE: The transesophogeal probe was passed without difficulty through the esophogus of the patient. Sedation performed by performing physician. The patient developed no complications during the procedure. IMPRESSIONS  1. Left ventricular ejection fraction, by estimation, is 60 to 65%. The left ventricle has normal function. The left ventricle has no regional wall motion abnormalities.  2. Right ventricular systolic function is normal. The right ventricular size is normal.  3. No left atrial/left atrial appendage thrombus was detected.  4. The mitral valve is normal in structure. Trivial mitral valve regurgitation.  5. The aortic valve is normal in structure. Aortic valve regurgitation is not visualized.  6. Evidence of atrial level shunting detected by color flow Doppler. Agitated saline contrast bubble study was positive with shunting observed within 3-6 cardiac cycles suggestive of interatrial shunt. There is a small patent foramen ovale. FINDINGS  Left Ventricle: Left ventricular ejection fraction, by estimation, is 60 to 65%. The left ventricle has normal function. The left ventricle has no regional wall motion abnormalities. The left ventricular internal cavity size was small. Right Ventricle: The right  ventricular size is normal. No increase in right ventricular wall thickness. Right ventricular systolic function is normal. Left Atrium: Left atrial size was normal in size. No left atrial/left atrial appendage thrombus was d

## 2021-07-28 NOTE — Progress Notes (Signed)
Patient Discharge Note ? ?Patient is to be discharged home via private vehicle with family. Patient belongings including wheelchair are with the patient. No IV to remove. Patient educated on wound care. ?

## 2021-08-02 ENCOUNTER — Telehealth: Payer: Self-pay | Admitting: Infectious Diseases

## 2021-08-02 NOTE — Telephone Encounter (Signed)
Jonathon York home health nurse with [facility] [phone]  ? ?Called on behalf of Jonathon York DOB Apr 16, 1976 stating pt refused home health services. She would like for you to give her a call back.  ?

## 2021-08-02 NOTE — Telephone Encounter (Signed)
I spoke to [place] with [facility] and advised her Dr. Delaine Lame did not order home health for the patient. Margreta Journey stated she is unsure who id following the patient. I advised her patient is not follow ing up with our office and patient has refused appointment with Dr. Blane Ohara office. Margreta Journey will reach our toe patient's [place] caseworker.  ?

## 2022-01-28 ENCOUNTER — Emergency Department
Admission: EM | Admit: 2022-01-28 | Discharge: 2022-01-28 | Disposition: A | Discharge status: Home / Self Care | Payer: Medicare HMO | Attending: Emergency Medicine | Admitting: Emergency Medicine

## 2022-01-28 ENCOUNTER — Telehealth: Payer: Self-pay | Admitting: Emergency Medicine

## 2022-01-28 ENCOUNTER — Other Ambulatory Visit: Payer: Self-pay

## 2022-01-28 ENCOUNTER — Emergency Department: Payer: Medicare HMO

## 2022-01-28 ENCOUNTER — Encounter: Payer: Self-pay | Admitting: Emergency Medicine

## 2022-01-28 DIAGNOSIS — R1310 Dysphagia, unspecified: Secondary | ICD-10-CM | POA: Insufficient documentation

## 2022-01-28 LAB — CBC WITH DIFFERENTIAL/PLATELET
Abs Immature Granulocytes: 0.03 10*3/uL (ref 0.00–0.07)
Basophils Absolute: 0.1 10*3/uL (ref 0.0–0.1)
Basophils Relative: 1 %
Eosinophils Absolute: 0.1 10*3/uL (ref 0.0–0.5)
Eosinophils Relative: 1 %
HCT: 37.5 % — ABNORMAL LOW (ref 39.0–52.0)
Hemoglobin: 10.3 g/dL — ABNORMAL LOW (ref 13.0–17.0)
Immature Granulocytes: 0 %
Lymphocytes Relative: 13 %
Lymphs Abs: 1.1 10*3/uL (ref 0.7–4.0)
MCH: 19 pg — ABNORMAL LOW (ref 26.0–34.0)
MCHC: 27.5 g/dL — ABNORMAL LOW (ref 30.0–36.0)
MCV: 69.2 fL — ABNORMAL LOW (ref 80.0–100.0)
Monocytes Absolute: 0.5 10*3/uL (ref 0.1–1.0)
Monocytes Relative: 5 %
Neutro Abs: 7 10*3/uL (ref 1.7–7.7)
Neutrophils Relative %: 80 %
Platelets: 416 10*3/uL — ABNORMAL HIGH (ref 150–400)
RBC: 5.42 MIL/uL (ref 4.22–5.81)
RDW: 19.8 % — ABNORMAL HIGH (ref 11.5–15.5)
Smear Review: NORMAL
WBC: 8.7 10*3/uL (ref 4.0–10.5)
nRBC: 0 % (ref 0.0–0.2)

## 2022-01-28 LAB — LACTIC ACID, PLASMA: Lactic Acid, Venous: 1.2 mmol/L (ref 0.5–1.9)

## 2022-01-28 LAB — COMPREHENSIVE METABOLIC PANEL
ALT: 16 U/L (ref 0–44)
AST: 16 U/L (ref 15–41)
Albumin: 2.9 g/dL — ABNORMAL LOW (ref 3.5–5.0)
Alkaline Phosphatase: 85 U/L (ref 38–126)
Anion gap: 7 (ref 5–15)
BUN: 13 mg/dL (ref 6–20)
CO2: 26 mmol/L (ref 22–32)
Calcium: 8.8 mg/dL — ABNORMAL LOW (ref 8.9–10.3)
Chloride: 109 mmol/L (ref 98–111)
Creatinine, Ser: 0.4 mg/dL — ABNORMAL LOW (ref 0.61–1.24)
GFR, Estimated: >60 mL/min (ref >60)
Glucose, Bld: 101 mg/dL — ABNORMAL HIGH (ref 70–99)
Potassium: 4 mmol/L (ref 3.5–5.1)
Sodium: 142 mmol/L (ref 135–145)
Total Bilirubin: 0.5 mg/dL (ref 0.3–1.2)
Total Protein: 7.6 g/dL (ref 6.5–8.1)

## 2022-01-28 LAB — LIPASE, BLOOD: Lipase: 24 U/L (ref 11–51)

## 2022-01-28 MED ORDER — SODIUM CHLORIDE 0.9 % IV BOLUS
1000.0000 mL | Freq: Once | INTRAVENOUS | Status: AC
  Administered 2022-01-28: 1000 mL via INTRAVENOUS

## 2022-01-28 MED ORDER — LIDOCAINE VISCOUS HCL 2 % MT SOLN
15.0000 mL | Freq: Once | OROMUCOSAL | Status: AC
  Administered 2022-01-28: 15 mL via OROMUCOSAL
  Filled 2022-01-28: qty 15

## 2022-01-28 MED ORDER — LIDOCAINE VISCOUS HCL 2 % MT SOLN: 15.0000 mL | Freq: Three times a day (TID) | OROMUCOSAL | 2 refills | Status: AC | PRN

## 2022-01-28 MED ORDER — FAMOTIDINE 20 MG PO TABS: 20.0000 mg | ORAL_TABLET | Freq: Every day | ORAL | 0 refills | Status: AC

## 2022-01-28 MED ORDER — OMEPRAZOLE 10 MG PO CPDR: 10.0000 mg | DELAYED_RELEASE_CAPSULE | Freq: Every day | ORAL | 6 refills | Status: AC

## 2022-01-28 MED ORDER — ALUM & MAG HYDROXIDE-SIMETH 200-200-20 MG/5ML PO SUSP
30.0000 mL | Freq: Once | ORAL | Status: AC
  Administered 2022-01-28: 30 mL via ORAL
  Filled 2022-01-28: qty 30

## 2022-01-28 NOTE — ED Triage Notes (Signed)
Pt states unable to swallow any liquid or food for past couple of months and getting worse. Pt states he feels like there is something blocking him from swallowing and he has to spit up anything he ties to swallow.

## 2022-01-28 NOTE — Telephone Encounter (Cosign Needed)
Received a phone call from pharmacy and daughter that the prescription is stuck in their system and they cannot receive the prescription.  Sending to a different [facility].

## 2022-01-28 NOTE — ED Provider Notes (Signed)
[facility] Provider Note  Patient Contact: 3:27 PM (approximate)   History   Dysphagia   HPI  Jonathon York is a 46 y.o. male who presents the emergency department complaining of dysphagia.  Patient states that he could not eat bacon or swallow water earlier today.  Patient states that in April he was admitted to the hospital here, he had spent some time in the floor roughly 5 or 6 days in his attempt to find something to drink he had reportedly swallowed some household cleaner.  Patient has a complicated medical history with history of paraplegia, malnutrition, decubitus ulcers, sepsis.  Patient was admitted in March of this year and spent several weeks in the hospital for sepsis secondary to decubitus ulcers.  He had OR debridement, had an IV PICC line.  Patient was receiving antibiotics at home through the PICC line.  I am unable to find any admission in this hospital or surrounding hospital systems for the reported prolonged downtime and drinking a caustic chemical.  Patient states that he has been able to swallow up until today.  Denies fever, chills, URI symptoms, abdominal pain.  States that something feels like it is "stuck" in his esophagus.     Physical Exam   Triage Vital Signs: ED Triage Vitals  Enc Vitals Group     BP 01/28/22 1431 (!) 127/90     Pulse Rate 01/28/22 1431 (!) 116     Resp 01/28/22 1431 20     Temp 01/28/22 1431 98 F (36.7 C)     Temp Source 01/28/22 1431 Oral     SpO2 01/28/22 1431 100 %     Weight 01/28/22 1432 110 lb (49.9 kg)     Height 01/28/22 1432 5\' 10"  (1.778 m)     Head Circumference --      Peak Flow --      Pain Score 01/28/22 1431 0     Pain Loc --      Pain Edu? --      Excl. in GC? --     Most recent vital signs: Vitals:   01/28/22 1431 01/28/22 1805  BP: (!) 127/90 120/88  Pulse: (!) 116 100  Resp: 20 20  Temp: 98 F (36.7 C)   SpO2: 100% 99%     General: Alert and in no acute  distress. ENT:      Ears:       Nose: No congestion/rhinnorhea.      Mouth/Throat: Mucous membranes are moist.  No gross oropharyngeal erythema or edema Neck: No stridor. No cervical spine tenderness to palpation.  Cardiovascular:  Good peripheral perfusion Respiratory: Normal respiratory effort without tachypnea or retractions. Lungs CTAB. Good air entry to the bases with no decreased or absent breath sounds. Gastrointestinal: Bowel sounds 4 quadrants. Soft and nontender to palpation. No guarding or rigidity. No palpable masses. No distention. No CVA tenderness. Musculoskeletal: Parapalegic Neurologic:  No gross focal neurologic deficits are appreciated.  Skin:   No rash noted Other:   ED Results / Procedures / Treatments   Labs (all labs ordered are listed, but only abnormal results are displayed) Labs Reviewed  COMPREHENSIVE METABOLIC PANEL - Abnormal; Notable for the following components:      Result Value   Glucose, Bld 101 (*)    Creatinine, Ser 0.40 (*)    Calcium 8.8 (*)    Albumin 2.9 (*)    All other components within normal limits  CBC WITH DIFFERENTIAL/PLATELET -  Abnormal; Notable for the following components:   Hemoglobin 10.3 (*)    HCT 37.5 (*)    MCV 69.2 (*)    MCH 19.0 (*)    MCHC 27.5 (*)    RDW 19.8 (*)    Platelets 416 (*)    All other components within normal limits  LACTIC ACID, PLASMA  LIPASE, BLOOD  LACTIC ACID, PLASMA  URINALYSIS, ROUTINE W REFLEX MICROSCOPIC     EKG     RADIOLOGY  I personally viewed, evaluated, and interpreted these images as part of my medical decision making, as well as reviewing the written report by the radiologist.  ED Provider Interpretation: No evidence of food bolus or retained foreign body in the esophagus.  No esophageal dilation or stricture identified on x-ray.  DG Chest 2 View  Result Date: 01/28/2022 CLINICAL DATA:  Dysphagia for several months EXAM: CHEST - 2 VIEW COMPARISON:  07/10/2021 FINDINGS:  Frontal and lateral views of the chest demonstrate an unremarkable cardiac silhouette. No acute airspace disease, effusion, or pneumothorax. Postsurgical changes from thoracolumbar spinal fusion unchanged. Small bone island right anterolateral sixth rib unchanged. No acute bony abnormalities. IMPRESSION: 1. No acute intrathoracic process. Electronically Signed   By: Sharlet Salina M.D.   On: 01/28/2022 16:10   DG Neck Soft Tissue  Result Date: 01/28/2022 CLINICAL DATA:  Dysphagia for several months EXAM: NECK SOFT TISSUES - 1+ VIEW COMPARISON:  None Available. FINDINGS: Frontal and lateral views of the soft tissues of the neck are obtained. Airways widely patent. Epiglottis is unremarkable. The prevertebral and retropharyngeal soft tissues are grossly normal. There are no radiopaque foreign bodies. Mild C3-4 spondylosis. No acute bony abnormalities. Visualized portions of the lung apices are clear. IMPRESSION: 1. Unremarkable soft tissues of the neck. Electronically Signed   By: Sharlet Salina M.D.   On: 01/28/2022 16:07    PROCEDURES:  Critical Care performed: No  Procedures   MEDICATIONS ORDERED IN ED: Medications  alum & mag hydroxide-simeth (MAALOX/MYLANTA) 200-200-20 MG/5ML suspension 30 mL (30 mLs Oral Given 01/28/22 1623)  lidocaine (XYLOCAINE) 2 % viscous mouth solution 15 mL (15 mLs Mouth/Throat Given 01/28/22 1623)  sodium chloride 0.9 % bolus 1,000 mL (1,000 mLs Intravenous New Bag/Given 01/28/22 1624)     IMPRESSION / MDM / ASSESSMENT AND PLAN / ED COURSE  I reviewed the triage vital signs and the nursing notes.                              Differential diagnosis includes, but is not limited to, achalasia, dysphagia, esophagitis, GERD, food bolus  Patient's presentation is most consistent with acute presentation with potential threat to life or bodily function.   Patient's diagnosis is consistent with dysphagia.  Patient presented to the emergency department complaining of  dysphagia.  Patient has had difficulty swallowing today.  Patient states he was trying to eat bacon and cannot swallow it.  Felt like he could not swallow water earlier today as well.  Patient was able to have the GI cocktail with no difficulty, states that this has improved his retained foreign body sensation.  Patient is managing his secretions well.  Labs are reassuring with being either patients baseline or better.  Imaging neck and chest with x-ray revealed no acute esophageal stricture, dilation or evidence of food bolus.  Again patient is swallowing at this time.  Patient has a history notated of GERD though he states that he does  not have GERD and does not take medications for same.  I will place the patient on famotidine, omeprazole and GI cocktail.  Patient is given instructions to return if he truly cannot swallow.  Otherwise follow-up with GI.[name] Patient is given ED precautions to return to the ED for any worsening or new symptoms.        FINAL CLINICAL IMPRESSION(S) / ED DIAGNOSES   Final diagnoses:  Dysphagia, unspecified type     Rx / DC Orders   ED Discharge Orders          Ordered    omeprazole (PRILOSEC) 10 MG capsule  Daily        01/28/22 1849    famotidine (PEPCID) 20 MG tablet  Daily        01/28/22 1849    GI Cocktail (alum & mag hydroxide-simethicone/lidocaine)oral mixture  3 times daily PRN        01/28/22 1849             Note:  This document was prepared using Dragon voice recognition software and may include unintentional dictation errors.   Brynda Peon 01/28/22 1853    Vanessa Palmer, MD 01/28/22 2350

## 2022-01-28 NOTE — ED Notes (Signed)
Pt removed iv himself before discharge.

## 2022-02-12 ENCOUNTER — Emergency Department: Payer: Medicare HMO

## 2022-02-12 ENCOUNTER — Emergency Department
Admission: EM | Admit: 2022-02-12 | Discharge: 2022-02-12 | Discharge status: Against Medical Advice | Payer: Medicare HMO | Attending: Physician Assistant | Admitting: Physician Assistant

## 2022-02-12 ENCOUNTER — Telehealth: Payer: Medicare HMO | Admitting: Nurse Practitioner

## 2022-02-12 ENCOUNTER — Other Ambulatory Visit: Payer: Self-pay

## 2022-02-12 DIAGNOSIS — Z5321 Procedure and treatment not carried out due to patient leaving prior to being seen by health care provider: Secondary | ICD-10-CM | POA: Diagnosis not present

## 2022-02-12 DIAGNOSIS — R1310 Dysphagia, unspecified: Secondary | ICD-10-CM

## 2022-02-12 DIAGNOSIS — R059 Cough, unspecified: Secondary | ICD-10-CM | POA: Insufficient documentation

## 2022-02-12 LAB — CBC WITH DIFFERENTIAL/PLATELET
Abs Immature Granulocytes: 0.04 10*3/uL (ref 0.00–0.07)
Basophils Absolute: 0.1 10*3/uL (ref 0.0–0.1)
Basophils Relative: 1 %
Eosinophils Absolute: 0.1 10*3/uL (ref 0.0–0.5)
Eosinophils Relative: 1 %
HCT: 36.3 % — ABNORMAL LOW (ref 39.0–52.0)
Hemoglobin: 10.2 g/dL — ABNORMAL LOW (ref 13.0–17.0)
Immature Granulocytes: 0 %
Lymphocytes Relative: 11 %
Lymphs Abs: 1.1 10*3/uL (ref 0.7–4.0)
MCH: 19.1 pg — ABNORMAL LOW (ref 26.0–34.0)
MCHC: 28.1 g/dL — ABNORMAL LOW (ref 30.0–36.0)
MCV: 68.1 fL — ABNORMAL LOW (ref 80.0–100.0)
Monocytes Absolute: 0.6 10*3/uL (ref 0.1–1.0)
Monocytes Relative: 6 %
Neutro Abs: 7.7 10*3/uL (ref 1.7–7.7)
Neutrophils Relative %: 81 %
Platelets: 461 10*3/uL — ABNORMAL HIGH (ref 150–400)
RBC: 5.33 MIL/uL (ref 4.22–5.81)
RDW: 19.6 % — ABNORMAL HIGH (ref 11.5–15.5)
WBC: 9.5 10*3/uL (ref 4.0–10.5)
nRBC: 0 % (ref 0.0–0.2)

## 2022-02-12 LAB — COMPREHENSIVE METABOLIC PANEL
ALT: 16 U/L (ref 0–44)
AST: 14 U/L — ABNORMAL LOW (ref 15–41)
Albumin: 2.7 g/dL — ABNORMAL LOW (ref 3.5–5.0)
Alkaline Phosphatase: 81 U/L (ref 38–126)
Anion gap: 8 (ref 5–15)
BUN: 14 mg/dL (ref 6–20)
CO2: 25 mmol/L (ref 22–32)
Calcium: 8.5 mg/dL — ABNORMAL LOW (ref 8.9–10.3)
Chloride: 106 mmol/L (ref 98–111)
Creatinine, Ser: 0.62 mg/dL (ref 0.61–1.24)
GFR, Estimated: >60 mL/min (ref >60)
Glucose, Bld: 98 mg/dL (ref 70–99)
Potassium: 4.1 mmol/L (ref 3.5–5.1)
Sodium: 139 mmol/L (ref 135–145)
Total Bilirubin: 0.4 mg/dL (ref 0.3–1.2)
Total Protein: 7.4 g/dL (ref 6.5–8.1)

## 2022-02-12 LAB — LIPASE, BLOOD: Lipase: 28 U/L (ref 11–51)

## 2022-02-12 NOTE — Progress Notes (Signed)
Virtual Visit Consent   Jonathon York, you are scheduled for a virtual visit with a [facility] provider today. Just as with appointments in the office, your consent must be obtained to participate. Your consent will be active for this visit and any virtual visit you may have with one of our providers in the next 365 days. If you have a MyChart account, a copy of this consent can be sent to you electronically.  As this is a virtual visit, video technology does not allow for your provider to perform a traditional examination. This may limit your provider's ability to fully assess your condition. If your provider identifies any concerns that need to be evaluated in person or the need to arrange testing (such as labs, EKG, etc.), we will make arrangements to do so. Although advances in technology are sophisticated, we cannot ensure that it will always work on either your end or our end. If the connection with a video visit is poor, the visit may have to be switched to a telephone visit. With either a video or telephone visit, we are not always able to ensure that we have a secure connection.  By engaging in this virtual visit, you consent to the provision of healthcare and authorize for your insurance to be billed (if applicable) for the services provided during this visit. Depending on your insurance coverage, you may receive a charge related to this service.  I need to obtain your verbal consent now. Are you willing to proceed with your visit today? Jonathon York has provided verbal consent on 02/12/2022 for a virtual visit (video or telephone). Apolonio Schneiders, FNP  Date: 02/12/2022 4:39 PM  Virtual Visit via Video Note   I, Apolonio Schneiders, connected with  Jonathon York  ([id number], 13-Jul-1975) on 02/12/22 at  4:45 PM EDT by a video-enabled telemedicine application and verified that I am speaking with the correct person using two identifiers.  Location: Patient: Virtual Visit Location Patient:  Home Provider: Virtual Visit Location Provider: Home Office   I discussed the limitations of evaluation and management by telemedicine and the availability of in person appointments. The patient expressed understanding and agreed to proceed.    History of Present Illness: Jonathon York is a 46 y.o. who identifies as a male who was assigned male at birth, and is being seen today for follow up with difficulty swallowing.  He went to the ED 2 weeks ago with acute onset of dysphagia. He was treated with Pepcid and Prilosec.   He has not vomited but he has gagged.  Denies any changes in stool color, denies any dark stool.   Feels now it is difficult to get anything down.   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   Generalized abdominal pain    UTI (urinary tract infection) 12/02/2019   UTI (urinary tract infection) due to urinary indwelling catheter ([place]) 12/01/2019   Infected decubitus ulcer 10/27/2019   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[place])  10/27/2019   Bilateral leg edema 10/27/2019   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    Constipation    Iron deficiency anemia    Sacral decubitus ulcer, stage IV ([place]) 07/13/2019   Intractable pain 11/26/2018   Anxiety 04/22/2018   Acute osteomyelitis of right foot ([place]) 03/18/2018   Pressure injury of skin 03/18/2018   Stage IV pressure ulcer of right buttock ([facility])    Protein-calorie malnutrition, severe 11/20/2016   Herpes  simplex infection of penis    Decubitus ulcer 11/19/2016   Severe recurrent major depression without psychotic features ([place]) 04/16/2016   Decubitus ulcer of sacral region    Sepsis ([place]) 04/15/2016   Amputation of fourth toe, right, 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/30/2015   Pressure ulcer stage III 04/30/2015   Sterile pyuria 04/30/2015   Paraplegia ([place]) 04/30/2015   Neurogenic bladder 04/30/2015   Toe osteomyelitis, right ([place]) 04/28/2015   Moderate malnutrition ([place]) 04/28/2015    Allergies:  Allergies  Allergen Reactions   Fish Allergy    Sulfa Antibiotics     Throwing up blood    Medications:  Current Outpatient Medications:    acetaminophen (TYLENOL) 325 MG tablet, Take 2 tablets (650 mg total) by mouth every 8 (eight) hours as needed for mild pain., Disp: , Rfl:    famotidine (PEPCID) 20 MG tablet, Take 1 tablet (20 mg total) by mouth daily., Disp: 30 tablet, Rfl: 0   feeding supplement (ENSURE ENLIVE / ENSURE PLUS) LIQD, Take 237 mLs by mouth 3 (three) times daily between meals., Disp: 237 mL, Rfl: 12   GI Cocktail (alum & mag hydroxide-simethicone/lidocaine)oral mixture, Take 15 mLs by mouth 3 (three) times daily as needed (Painful or difficult swallowing)., Disp: 180 mL, Rfl: 2   GI Cocktail (alum & mag hydroxide-simethicone/lidocaine)oral mixture, Take 15 mLs by mouth 3 (three) times daily as needed (Difficulty swallowing)., Disp: 180 mL, Rfl: 2   multivitamin-lutein (OCUVITE-LUTEIN) CAPS capsule, Take 1 capsule by mouth daily. (Patient not taking: Reported on 07/09/2021), Disp: , Rfl: 0   omeprazole (PRILOSEC) 10 MG capsule, Take 1 capsule (10 mg total) by mouth daily., Disp: 30 capsule, Rfl: 6   oxyCODONE-acetaminophen (PERCOCET/ROXICET) 5-325 MG tablet, Take 1 tablet by mouth every 8 (eight) hours as needed for moderate pain., Disp: 12 tablet, Rfl: 0   vitamin B-12 1000 MCG tablet, Take 1 tablet (1,000 mcg total) by mouth daily., Disp: , Rfl:    Vitamin D, Ergocalciferol, (DRISDOL)  1.25 MG (50000 UNIT) CAPS capsule, Take 1 capsule (50,000 Units total) by mouth every 7 (seven) days., Disp: 4 capsule, Rfl: 0  Observations/Objective: Patient is well-developed, well-nourished in no acute distress.  Resting comfortably  at home.  Head is normocephalic, atraumatic.  No labored breathing.  Speech is clear and coherent with logical content.  Patient is alert and oriented at baseline.    Assessment and Plan: 1. Dysphagia, unspecified type Given ongoing symptoms with complicated medical history advised patient to return to ED for further workup at this time.  He is agreeable to plan and will head to [facility] at this time     Follow Up Instructions: I discussed the assessment and treatment plan with the patient. The patient was provided an opportunity to ask questions and all were answered. The patient agreed with the plan and demonstrated an understanding of the instructions.  A copy of instructions were sent to the patient via MyChart unless otherwise noted below.    The patient was advised to call back or seek an in-person evaluation if  the symptoms worsen or if the condition fails to improve as anticipated.  Time:  I spent 7 minutes with the patient via telehealth technology discussing the above problems/concerns.    Apolonio Schneiders, FNP

## 2022-02-12 NOTE — ED Triage Notes (Signed)
Pt to ED via POV from home. Pt reports difficulty swallowing and feeling like food gets stuck x2-3 months. Pt was seen by PCP and placed on medicine. Pt reports still unable to get food down and when he tries he has to cough it up. Pt was referred to ER for evaluation and possible scope.

## 2022-02-12 NOTE — ED Provider Triage Note (Signed)
Emergency Medicine Provider Triage Evaluation Note  Jonathon York, a 46 y.o. male  was evaluated in triage.  Pt complains of dysphagia.  Patient returns to the ED for evaluation of ongoing difficulty swallowing foods and liquids.  He was evaluated in the ED last month for the same complaints, and was referred to GI for further evaluation.  He presents to the ED not having followed up as suggested.  He had a televisit today with on-call nurse practitioner who advised to report to the ED for fluid hydration, possible admission, and endoscopy.  Denies any choking, gagging, or aspiration.  He also denies any abdominal pain or bowel changes.  Patient is wheelchair-bound secondary to paraparesis of the lower extremities.  He denies any fevers, chills, or sweats.  He has been tolerating the previously prescribed GI cocktail without difficulty.  He does endorse difficulty swallowing pills at this time he denies any history of GERD, reflux, esophageal stricture, or esophageal spasm.  Review of Systems  Positive: Dysphagia Negative: Chest pain, SOB  Physical Exam  BP 130/84 (BP Location: Right Arm)   Pulse (!) 116   Temp 98 F (36.7 C) (Oral)   Resp 20   SpO2 99%  Gen:   Awake, no distress NAD Resp:  Normal effort CTA MSK:   Moves upper extremities without difficulty  ABD:  Soft, nontender  Medical Decision Making  Medically screening exam initiated at 5:43 PM.  Appropriate orders placed.  [name] was informed that the remainder of the evaluation will be completed by another provider, this initial triage assessment does not replace that evaluation, and the importance of remaining in the ED until their evaluation is complete.  Patient to the ED for ongoing evaluation of dysphagia.  He reports persistent symptoms of difficulty passing solid food and water.   [name], PA-C 02/12/22 1752

## 2022-02-20 LAB — HSV DNA BY PCR (REFERENCE LAB)
HSV 1 DNA: POSITIVE — AB
HSV 2 DNA: NEGATIVE

## 2022-08-25 IMAGING — CT CT IMAGE GUIDED DRAINAGE BY PERCUTANEOUS CATHETER
1 of 3 series · 13 of 32 positions shown, 18 images · non-contrast
Comparison: None.

INDICATION: Urethral fistula to pelvic floor with inability to place Foley
catheter. Need for suprapubic bladder drainage prior to surgical
repair of urethral fistula.

EXAM:
CT-GUIDED PLACEMENT OF SUPRAPUBIC DRAINAGE CATHETER IN BLADDER

[Series 2: i-spiral 5.0 b30f · axial · 0.69mm/px · z∈[+878,+1064]mm · 13 of 61 slices shown, 18 images]
[im 4/61  soft-tissue]
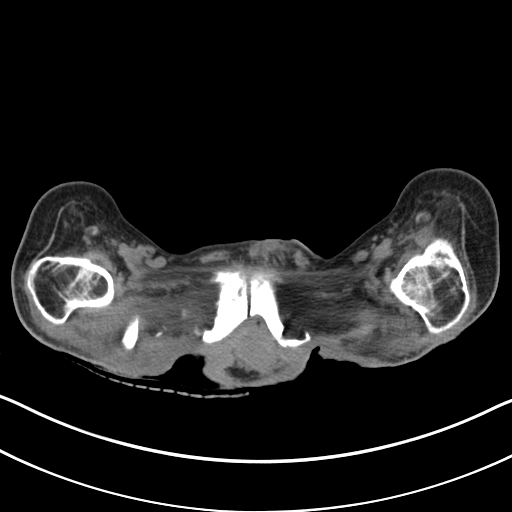
[im 4/61  bone]
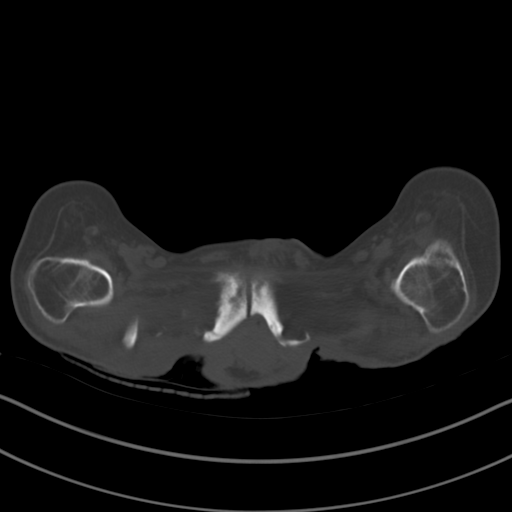
[im 11/61  soft-tissue]
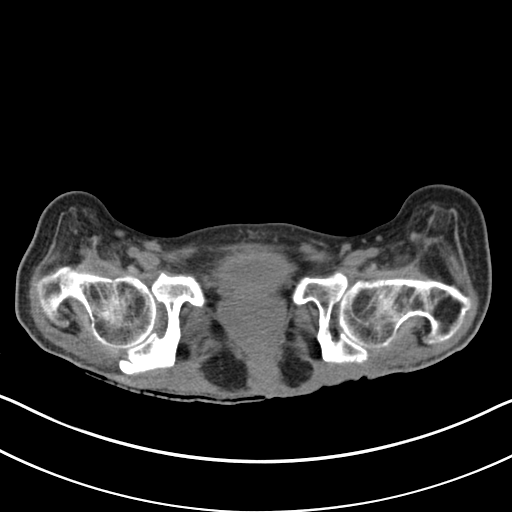
[im 15/61  soft-tissue]
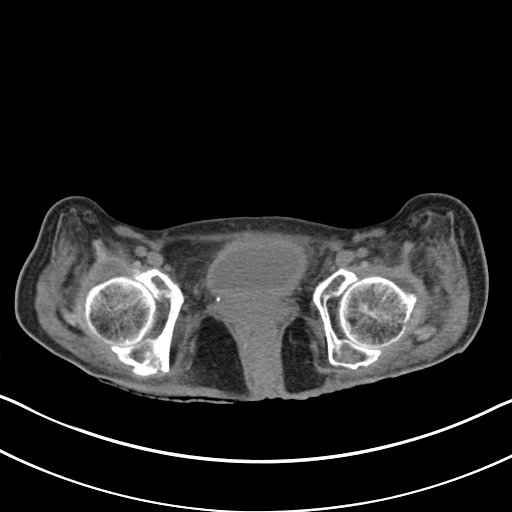
[im 18/61  soft-tissue]
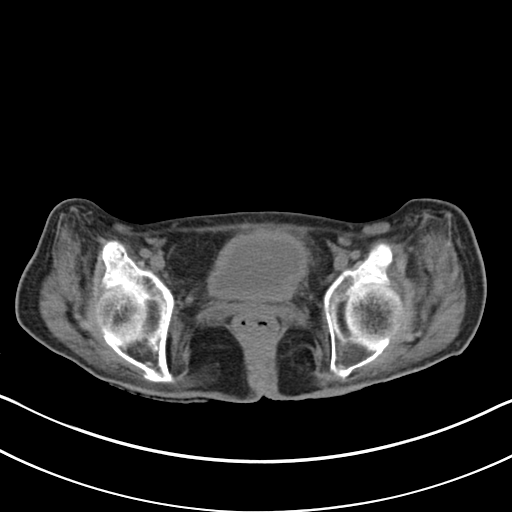
[im 25/61  soft-tissue]
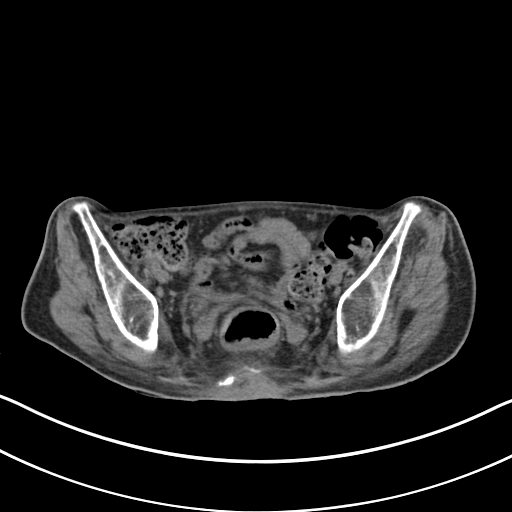
[im 29/61  soft-tissue]
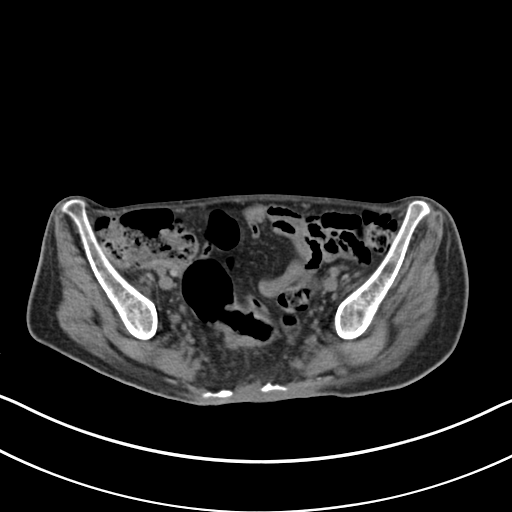
[im 32/61  soft-tissue]
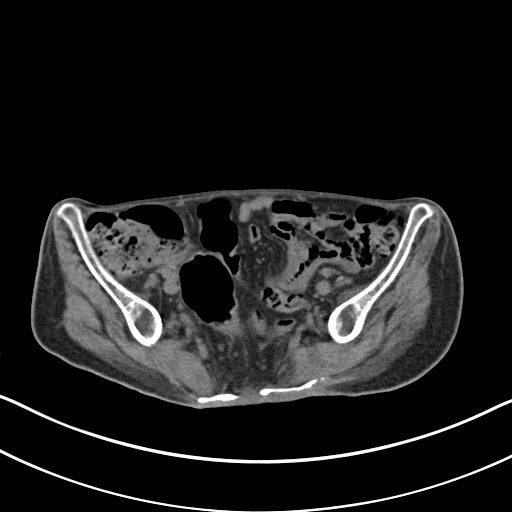
[im 39/61  soft-tissue]
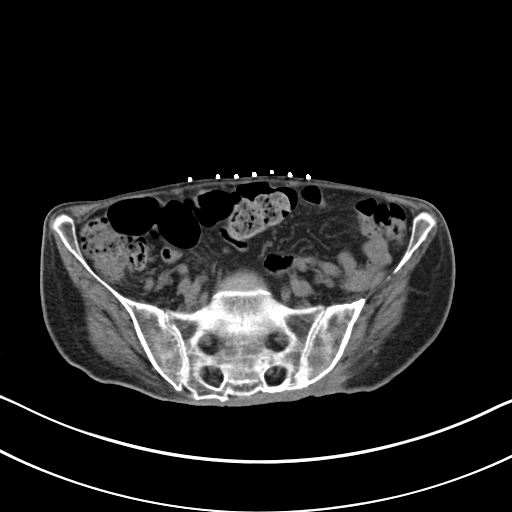
[im 43/61  soft-tissue]
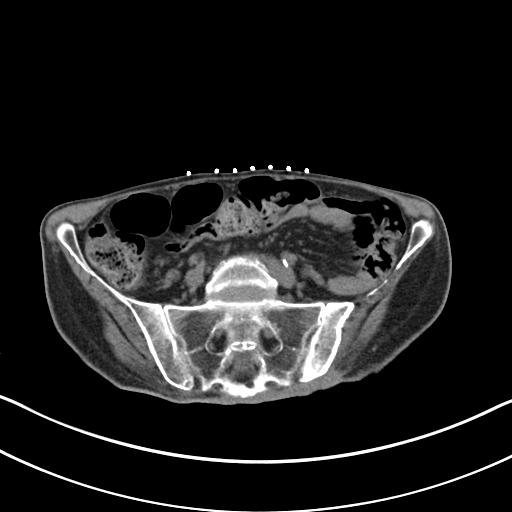
[im 43/61  bone]
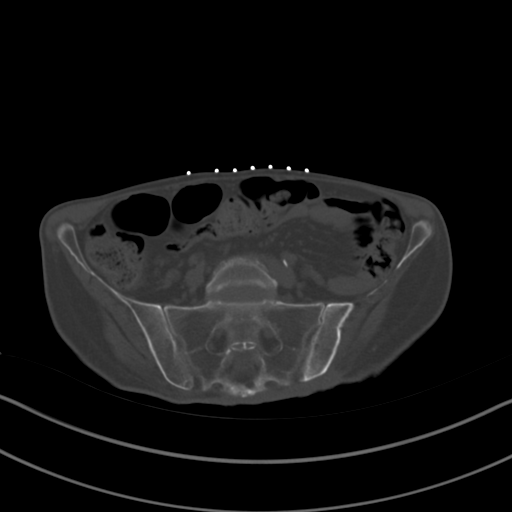
[im 46/61  soft-tissue]
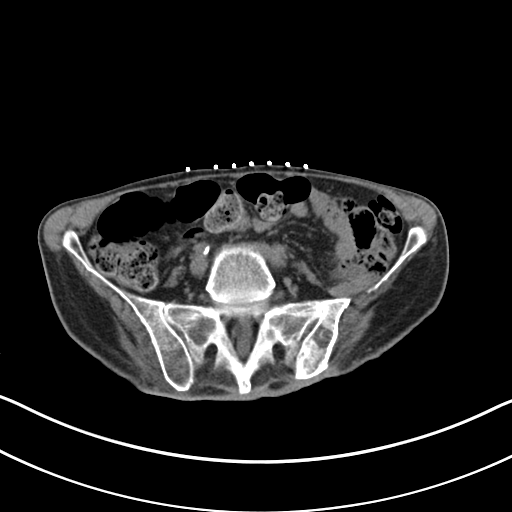
[im 46/61  lung]
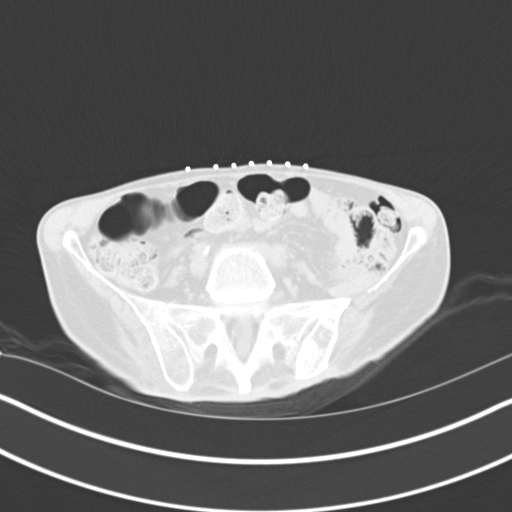
[im 50/61  lung]
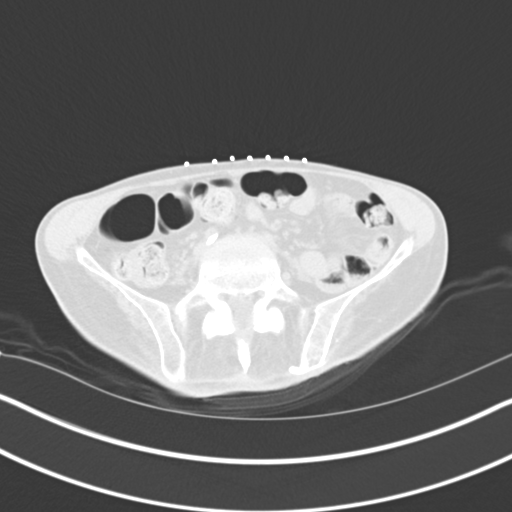
[im 53/61  soft-tissue]
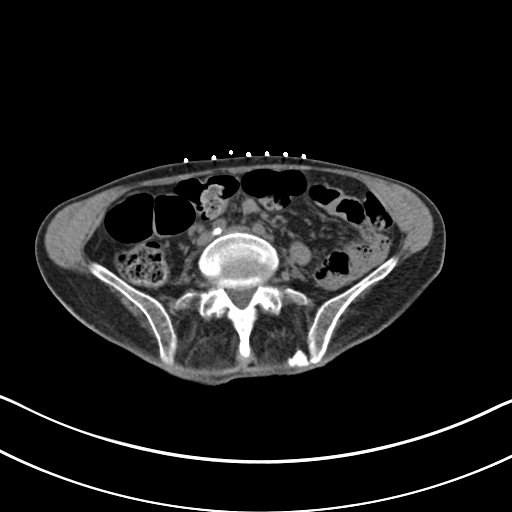
[im 53/61  lung]
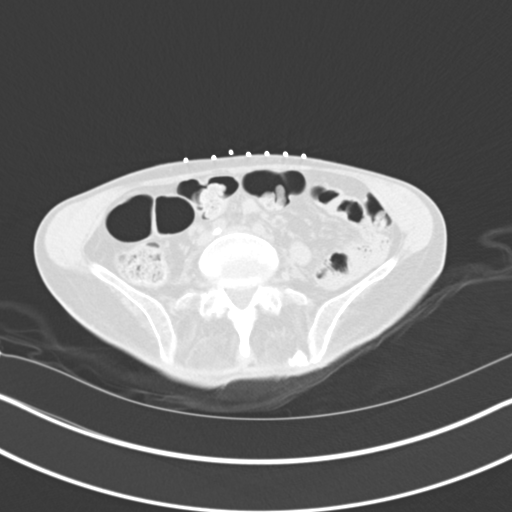
[im 57/61  soft-tissue]
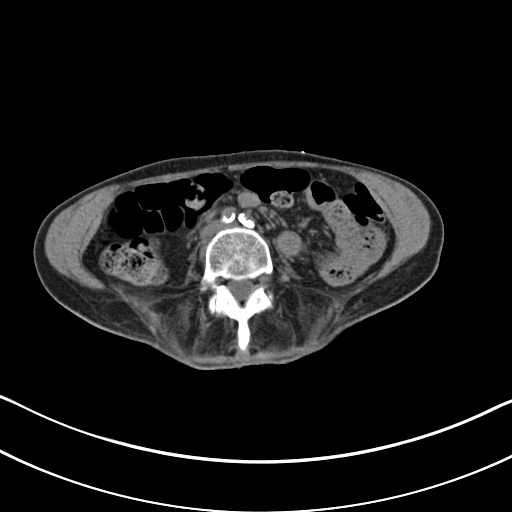
[im 57/61  lung]
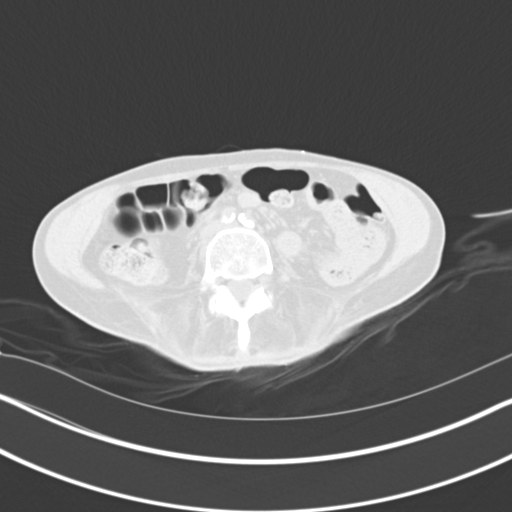

[13 of 32 positions shown; findings below may reference images not displayed]

MEDICATIONS:
2 g IV Ancef; The antibiotic was administered in an appropriate time
frame prior to skin puncture.

ANESTHESIA/SEDATION:
Fentanyl 100 mcg IV; Versed 2.0 mg IV

Moderate Sedation Time:  20 minutes.

The patient was continuously monitored during the procedure by the
interventional radiology nurse under my direct supervision.

CONTRAST:  None

FLUOROSCOPY TIME:  CT guidance.

COMPLICATIONS:
None immediate.

PROCEDURE:
Informed written consent was obtained from the patient after a
thorough discussion of the procedural risks, benefits and
alternatives. All questions were addressed. Maximal Sterile Barrier
Technique was utilized including caps, mask, sterile gowns, sterile
gloves, sterile drape, hand hygiene and skin antiseptic. A timeout
was performed prior to the initiation of the procedure.

Supine imaging of the pelvis was performed. Skin overlying the
suprapubic region was prepped with chlorhexidine. 1% lidocaine was
infiltrated for local anesthesia.

An 18 gauge trocar needle was advanced into the bladder lumen from
an anterior suprapubic approach. After confirming needle tip
position and return of urine, a guidewire was advanced into the
bladder lumen. The percutaneous tract was dilated over the wire and
a 12 French pigtail drainage catheter advanced over the wire.
Catheter positioning was confirmed by CT. The catheter was connected
to a gravity drainage bag and secured at the skin with a Prolene
retention suture and StatLock device.
FINDINGS: Catheter distension of the bladder lumen was not possible due to the
severe urethral fistula and breakdown present. There was enough
urine volume in the bladder lumen to allow suprapubic catheter
placement. The catheter is formed in the bladder lumen after
placement and is draining blood tinged urine initially after
placement.
IMPRESSION: CT-guided suprapubic bladder drainage catheter placement. A 12
French pigtail drainage catheter was placed in the bladder lumen
from a suprapubic approach and attached to gravity bag drainage.
